# Patient Record
Sex: Female | Born: 1950 | Race: White | Hispanic: No | Marital: Married | Smoking: Former smoker
Health system: Southern US, Community
[De-identification: ages and names within clinical notes are randomized; demographics above are authoritative.]

## PROBLEM LIST (undated history)

## (undated) DIAGNOSIS — I6529 Occlusion and stenosis of unspecified carotid artery: Secondary | ICD-10-CM

## (undated) DIAGNOSIS — M1990 Unspecified osteoarthritis, unspecified site: Secondary | ICD-10-CM

## (undated) DIAGNOSIS — H3581 Retinal edema: Secondary | ICD-10-CM

## (undated) DIAGNOSIS — K219 Gastro-esophageal reflux disease without esophagitis: Secondary | ICD-10-CM

## (undated) DIAGNOSIS — H18603 Keratoconus, unspecified, bilateral: Secondary | ICD-10-CM

## (undated) DIAGNOSIS — I7300 Raynaud's syndrome without gangrene: Secondary | ICD-10-CM

## (undated) DIAGNOSIS — I739 Peripheral vascular disease, unspecified: Secondary | ICD-10-CM

## (undated) DIAGNOSIS — G40909 Epilepsy, unspecified, not intractable, without status epilepticus: Secondary | ICD-10-CM

## (undated) DIAGNOSIS — E785 Hyperlipidemia, unspecified: Secondary | ICD-10-CM

## (undated) DIAGNOSIS — H3530 Unspecified macular degeneration: Secondary | ICD-10-CM

## (undated) DIAGNOSIS — G709 Myoneural disorder, unspecified: Secondary | ICD-10-CM

## (undated) DIAGNOSIS — I10 Essential (primary) hypertension: Secondary | ICD-10-CM

## (undated) DIAGNOSIS — I639 Cerebral infarction, unspecified: Secondary | ICD-10-CM

## (undated) DIAGNOSIS — M069 Rheumatoid arthritis, unspecified: Secondary | ICD-10-CM

## (undated) DIAGNOSIS — H409 Unspecified glaucoma: Secondary | ICD-10-CM

## (undated) DIAGNOSIS — G43909 Migraine, unspecified, not intractable, without status migrainosus: Secondary | ICD-10-CM

## (undated) HISTORY — DX: Epilepsy, unspecified, not intractable, without status epilepticus: G40.909

## (undated) HISTORY — DX: Raynaud's syndrome without gangrene: I73.00

## (undated) HISTORY — DX: Rheumatoid arthritis, unspecified: M06.9

## (undated) HISTORY — DX: Keratoconus, unspecified, bilateral: H18.603

## (undated) HISTORY — DX: Hyperlipidemia, unspecified: E78.5

## (undated) HISTORY — DX: Occlusion and stenosis of unspecified carotid artery: I65.29

## (undated) HISTORY — DX: Unspecified glaucoma: H40.9

## (undated) HISTORY — DX: Essential (primary) hypertension: I10

## (undated) HISTORY — DX: Migraine, unspecified, not intractable, without status migrainosus: G43.909

## (undated) HISTORY — DX: Unspecified osteoarthritis, unspecified site: M19.90

---

## 1979-07-20 DIAGNOSIS — H18603 Keratoconus, unspecified, bilateral: Secondary | ICD-10-CM

## 1979-07-20 HISTORY — DX: Keratoconus, unspecified, bilateral: H18.603

## 1993-07-19 HISTORY — PX: CHOLECYSTECTOMY: SHX55

## 1998-09-08 ENCOUNTER — Encounter: Payer: Self-pay | Admitting: Emergency Medicine

## 1998-09-08 ENCOUNTER — Emergency Department (HOSPITAL_COMMUNITY): Admission: EM | Admit: 1998-09-08 | Discharge: 1998-09-08 | Payer: Self-pay | Admitting: Emergency Medicine

## 1999-03-20 ENCOUNTER — Other Ambulatory Visit: Admission: RE | Admit: 1999-03-20 | Discharge: 1999-03-20 | Payer: Self-pay | Admitting: Obstetrics and Gynecology

## 2000-05-02 ENCOUNTER — Ambulatory Visit (HOSPITAL_COMMUNITY): Admission: RE | Admit: 2000-05-02 | Discharge: 2000-05-02 | Payer: Self-pay | Admitting: Endocrinology

## 2000-05-02 ENCOUNTER — Encounter: Payer: Self-pay | Admitting: Endocrinology

## 2000-06-17 ENCOUNTER — Encounter: Payer: Self-pay | Admitting: General Surgery

## 2000-06-20 ENCOUNTER — Encounter (INDEPENDENT_AMBULATORY_CARE_PROVIDER_SITE_OTHER): Payer: Self-pay | Admitting: Specialist

## 2000-06-20 ENCOUNTER — Encounter: Payer: Self-pay | Admitting: General Surgery

## 2000-06-20 ENCOUNTER — Observation Stay (HOSPITAL_COMMUNITY): Admission: RE | Admit: 2000-06-20 | Discharge: 2000-06-21 | Payer: Self-pay | Admitting: General Surgery

## 2000-11-17 ENCOUNTER — Encounter: Admission: RE | Admit: 2000-11-17 | Discharge: 2000-11-17 | Payer: Self-pay | Admitting: Obstetrics and Gynecology

## 2000-11-17 ENCOUNTER — Encounter: Payer: Self-pay | Admitting: Obstetrics and Gynecology

## 2000-12-08 ENCOUNTER — Other Ambulatory Visit: Admission: RE | Admit: 2000-12-08 | Discharge: 2000-12-08 | Payer: Self-pay | Admitting: Obstetrics and Gynecology

## 2002-11-12 ENCOUNTER — Encounter (INDEPENDENT_AMBULATORY_CARE_PROVIDER_SITE_OTHER): Payer: Self-pay

## 2002-11-12 ENCOUNTER — Ambulatory Visit (HOSPITAL_COMMUNITY): Admission: RE | Admit: 2002-11-12 | Discharge: 2002-11-12 | Payer: Self-pay | Admitting: *Deleted

## 2003-04-11 ENCOUNTER — Ambulatory Visit (HOSPITAL_COMMUNITY): Admission: RE | Admit: 2003-04-11 | Discharge: 2003-04-11 | Payer: Self-pay | Admitting: Endocrinology

## 2003-04-12 ENCOUNTER — Encounter: Admission: RE | Admit: 2003-04-12 | Discharge: 2003-04-12 | Payer: Self-pay | Admitting: Endocrinology

## 2003-04-12 ENCOUNTER — Encounter: Payer: Self-pay | Admitting: Endocrinology

## 2004-01-10 ENCOUNTER — Other Ambulatory Visit: Admission: RE | Admit: 2004-01-10 | Discharge: 2004-01-10 | Payer: Self-pay | Admitting: Obstetrics and Gynecology

## 2004-02-24 ENCOUNTER — Encounter (INDEPENDENT_AMBULATORY_CARE_PROVIDER_SITE_OTHER): Payer: Self-pay | Admitting: *Deleted

## 2004-02-24 ENCOUNTER — Ambulatory Visit (HOSPITAL_COMMUNITY): Admission: RE | Admit: 2004-02-24 | Discharge: 2004-02-24 | Payer: Self-pay | Admitting: *Deleted

## 2004-04-23 ENCOUNTER — Encounter: Admission: RE | Admit: 2004-04-23 | Discharge: 2004-04-23 | Payer: Self-pay | Admitting: Internal Medicine

## 2006-05-05 ENCOUNTER — Ambulatory Visit: Payer: Self-pay | Admitting: Internal Medicine

## 2006-06-15 ENCOUNTER — Ambulatory Visit: Payer: Self-pay | Admitting: Internal Medicine

## 2006-07-19 HISTORY — PX: CAROTID ARTERY ANGIOPLASTY: SHX1300

## 2006-10-11 ENCOUNTER — Ambulatory Visit: Payer: Self-pay | Admitting: Internal Medicine

## 2006-10-31 ENCOUNTER — Emergency Department (HOSPITAL_COMMUNITY): Admission: EM | Admit: 2006-10-31 | Discharge: 2006-10-31 | Payer: Self-pay | Admitting: Emergency Medicine

## 2007-02-21 ENCOUNTER — Encounter: Admission: RE | Admit: 2007-02-21 | Discharge: 2007-02-21 | Payer: Self-pay | Admitting: Rheumatology

## 2007-11-09 ENCOUNTER — Ambulatory Visit: Payer: Self-pay | Admitting: *Deleted

## 2007-11-13 ENCOUNTER — Encounter: Admission: RE | Admit: 2007-11-13 | Discharge: 2007-11-13 | Payer: Self-pay | Admitting: *Deleted

## 2007-11-15 ENCOUNTER — Ambulatory Visit: Payer: Self-pay | Admitting: *Deleted

## 2007-11-15 ENCOUNTER — Encounter (INDEPENDENT_AMBULATORY_CARE_PROVIDER_SITE_OTHER): Payer: Self-pay | Admitting: *Deleted

## 2007-11-15 ENCOUNTER — Inpatient Hospital Stay (HOSPITAL_COMMUNITY): Admission: RE | Admit: 2007-11-15 | Discharge: 2007-11-16 | Payer: Self-pay | Admitting: *Deleted

## 2007-11-15 HISTORY — PX: CAROTID ENDARTERECTOMY: SUR193

## 2007-11-30 ENCOUNTER — Ambulatory Visit: Payer: Self-pay | Admitting: *Deleted

## 2007-12-14 ENCOUNTER — Ambulatory Visit: Payer: Self-pay | Admitting: *Deleted

## 2008-02-15 ENCOUNTER — Ambulatory Visit: Payer: Self-pay | Admitting: *Deleted

## 2008-02-27 ENCOUNTER — Encounter (INDEPENDENT_AMBULATORY_CARE_PROVIDER_SITE_OTHER): Payer: Self-pay | Admitting: *Deleted

## 2008-02-27 ENCOUNTER — Ambulatory Visit (HOSPITAL_COMMUNITY): Admission: RE | Admit: 2008-02-27 | Discharge: 2008-02-27 | Payer: Self-pay | Admitting: *Deleted

## 2008-05-23 ENCOUNTER — Ambulatory Visit: Payer: Self-pay | Admitting: *Deleted

## 2009-07-09 ENCOUNTER — Ambulatory Visit: Payer: Self-pay | Admitting: Vascular Surgery

## 2010-01-21 ENCOUNTER — Encounter: Admission: RE | Admit: 2010-01-21 | Discharge: 2010-01-21 | Payer: Self-pay | Admitting: Endocrinology

## 2010-07-21 ENCOUNTER — Encounter
Admission: RE | Admit: 2010-07-21 | Discharge: 2010-07-21 | Payer: Self-pay | Source: Home / Self Care | Attending: Endocrinology | Admitting: Endocrinology

## 2010-08-08 ENCOUNTER — Encounter: Payer: Self-pay | Admitting: Endocrinology

## 2010-08-14 ENCOUNTER — Ambulatory Visit: Admit: 2010-08-14 | Payer: Self-pay | Admitting: Vascular Surgery

## 2010-08-14 ENCOUNTER — Ambulatory Visit: Admit: 2010-08-14 | Payer: Self-pay | Attending: Vascular Surgery | Admitting: Vascular Surgery

## 2010-09-11 ENCOUNTER — Other Ambulatory Visit (INDEPENDENT_AMBULATORY_CARE_PROVIDER_SITE_OTHER): Payer: BC Managed Care – PPO

## 2010-09-11 ENCOUNTER — Ambulatory Visit (INDEPENDENT_AMBULATORY_CARE_PROVIDER_SITE_OTHER): Payer: BC Managed Care – PPO

## 2010-09-11 DIAGNOSIS — I6529 Occlusion and stenosis of unspecified carotid artery: Secondary | ICD-10-CM

## 2010-09-11 DIAGNOSIS — Z48812 Encounter for surgical aftercare following surgery on the circulatory system: Secondary | ICD-10-CM

## 2010-09-11 NOTE — H&P (Signed)
HISTORY AND PHYSICAL EXAMINATION  September 11, 2010  Re:  [place], [place]               DOB:  [birth year]  HISTORY OF PRESENT ILLNESS:  The patient is a 60 year old Caucasian female who presents for continued follow-up of her carotid artery disease and duplex exam.  The patient has been doing well since her last evaluation and is without complaint.  She denies amaurosis fugax, CVA, hemiparesis, hemiplegia, dysphasia, dysarthria, and facial drooping. She also denies claudication symptoms, chest pain, shortness of breath, nausea, vomiting, diarrhea, and constipation.  [name] past medical history is also significant for hypertension, hyperlipidemia, rheumatoid arthritis, epilepsy, and migraines.  PAST MEDICAL AND PAST SURGICAL HISTORY: 1. Hypertension. 2. Hyperlipidemia. 3. Rheumatoid arthritis. 4. GERD. 5. Epilepsy. 6. Migraines. 7. 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 Madilyn Fireman in 2009.  SOCIAL HISTORY:  The patient is married with 2 children.  She quit smoking 30 years ago and does not drink alcohol.  FAMILY HISTORY:  Coronary artery disease, hypertension, rheumatoid arthritis.  The patient did not bring a current list of medications with her.  REVIEW OF SYSTEMS:  A complete review of systems is negative.  Please see HPI for pertinent information.  PHYSICAL EXAM:  Blood pressure 177/87, O2 saturation 98%, heart rate 87. In general, this is a well-nourished female in no acute distress. HEENT: PERRL, EOMI.  Conjunctivae are normal.  Lungs: Clear to auscultation.  Cardiovascular: Regular rate and rhythm with no bruits auscultated.  Abdomen: Soft with active bowel sounds.  Musculoskeletal: No major deformities, no cyanosis.  There are 2+ carotid, radial, femoral, and posterior tibial pulses present bilaterally.  Neuro: No focal  weakness or paresthesias.  Skin: No ulcers or rashes.  IMAGING:  Carotid duplex exam performed on September 11, 2010 reveals no hemodynamically significant stenosis of the right internal carotid artery and a patent left carotid endarterectomy site with no left internal carotid artery stenosis.  This study is not changed since previous imaging performed on July 09, 2009.  ASSESSMENT: 1. Extracranial cerebrovascular occlusive disease, status post left     carotid endarterectomy. 2. Hypertension. 3. Hyperlipidemia. 4. Rheumatoid arthritis.  PLAN: 1. The patient will continue yearly routine evaluation with carotid     duplex for her carotid disease. 2. [name] medical issues will continue to be followed by her     primary care physician.  Pecola Leisure, [place]  Fransisco Hertz, MD Electronically Signed  AY/MEDQ  D:  09/11/2010  T:  09/11/2010  Job:  [id number]

## 2010-09-18 NOTE — Procedures (Unsigned)
CAROTID DUPLEX EXAM  INDICATION:  Left carotid endarterectomy.  HISTORY: Diabetes:  No. Cardiac:  No. Hypertension:  Yes. Smoking:  Previous. Previous Surgery:  Left carotid endarterectomy on 11/15/2007. CV History:  Currently asymptomatic. Amaurosis Fugax No, Paresthesias No, Hemiparesis No.                                      RIGHT             LEFT Brachial systolic pressure:         162               164 Brachial Doppler waveforms:         Normal            Normal Vertebral direction of flow:        Antegrade         Antegrade DUPLEX VELOCITIES (cm/sec) CCA peak systolic                   76                68 ECA peak systolic                   74                62 ICA peak systolic                   74                80 ICA end diastolic                   28                26 PLAQUE MORPHOLOGY:                  Heterogenous PLAQUE AMOUNT:                      Mild              None PLAQUE LOCATION:                    ICA  IMPRESSION: 1. No hemodynamically significant stenosis of the right internal     carotid artery with plaque formations noted, as described above. 2. Patent left carotid endarterectomy site with no left internal     carotid artery stenosis. 3. No significant change noted in the Doppler velocities when compared     to the previous examination on 07/09/2009.  ___________________________________________ Janetta Hora. Fields, MD  CH/MEDQ  D:  09/11/2010  T:  09/11/2010  Job:  [id number]

## 2010-12-01 NOTE — Discharge Summary (Signed)
Brianna [name], Brianna [name]               ACCOUNT NO.:  [id number]   MEDICAL RECORD NO.:  [id number]          PATIENT TYPE:  INP   LOCATION:  3307                         FACILITY:  [facility]   PHYSICIAN:  Wilmon Arms, PA    DATE OF BIRTH:  May 02, 1951   DATE OF ADMISSION:  11/15/2007  DATE OF DISCHARGE:  11/16/2007                               DISCHARGE SUMMARY   DISCHARGE DIAGNOSES:  1. Left carotid occlusive disease.  2. Hyperlipidemia.  3. History of seizures.  4. Migraine headaches.  5. Status post corneal transplant in the left eyes 3 times, right eye      once.   PROCEDURES PERFORMED:  Left carotid endarterectomy with Dacron patch  angioplasty closure.   COMPLICATIONS:  None.   DISCHARGE MEDICATIONS:  1. Phenergan p.r.n.  2. Imitrex p.r.n.  3. Humira injections 5 monthly.  4. Mobic 5 mg monthly.  5. Plaquenil 200 mg p.o. b.i.d.  6. Crestor p.o. daily.  7. Steroid drops, Pred Forte, O.S., daily.  8. Norvasc 2.5 mg p.o. daily.  9. Aspirin 325 mg p.o. daily.  10.Dilantin 300 mg p.o.  11.Calcium 500 mg p.o. daily.  12.Vitamin C 500 mg p.o.  13.Percocet 5/325 1 p.o. q.4 h p.r.n. pain, total number 22 were      given.   DISPOSITION:  She is being discharged home in stable condition with her  wound healing well.  Neurologically intact.  She was given careful  instructions on her wound care.  She is to clean the wound with soap and  warm water.  She is to observe the wound for drainage, increasing  redness, swelling, pain, fever greater than 101.2, and neurological  changes.  She is given appointment to see Dr. Madilyn [name] in 2 weeks.  The  office will call her with the appointment.   BRIEF IDENTIFYING STATEMENT:  Complete details, please refer the typed  history and physical.  Briefly, this is a very pleasant woman who was  referred to Dr. Madilyn [name] for left carotid occlusive disease.  Dr. Madilyn [name]  found her to be a suitable candidate for left carotid endarterectomy.  She was informed  of the risks and benefits of the procedure and after  careful consideration, elected to proceed with surgery.   HOSPITAL COURSE:  Preoperative workup was completed as an outpatient.  She was brought in through same-day surgery and underwent the  aforementioned left carotid endarterectomy by Dr. Madilyn [name].  The procedure  was without  complication.  She was returned to the Post Anesthesia Care Unit  extubated.  Following stabilization, she was transferred to a bed in a  surgical step-down unit.  She was observed overnight.  The next morning,  she was neurologically intact.  Her wound was healing well.  She was  felt stable and was discharged home.      Wilmon Arms, PA     KEL/MEDQ  D:  11/16/2007  T:  11/16/2007  Job:  [id number]   cc:   Balinda Quails, M.D.  Evie Lacks, MD  Brooke Bonito, M.D.

## 2010-12-01 NOTE — Op Note (Signed)
Brianna [name], Brianna [name]               ACCOUNT NO.:  [id number]   MEDICAL RECORD NO.:  [id number]          PATIENT TYPE:  INP   LOCATION:  2899                         FACILITY:  [facility]   PHYSICIAN:  Balinda Quails, M.D.    DATE OF BIRTH:  Feb 05, 1951   DATE OF PROCEDURE:  11/15/2007  DATE OF DISCHARGE:                               OPERATIVE REPORT   SURGEON:  Balinda Quails, MD   ASSISTANT:  RNFA   ANESTHETIC:  General endotracheal.   ANESTHESIOLOGIST:  Germaine Pomfret, MD   PREOPERATIVE DIAGNOSIS:  Severe left internal carotid artery stenosis.   POSTOPERATIVE DIAGNOSIS:  Severe left internal carotid artery stenosis.   PROCEDURE:  Left carotid endarterectomy with Dacron patch angioplasty.   CLINICAL NOTE:  Brianna [name] is a 60 year old female referred by Dr.  Sandria Manly with evidence of a severe left internal carotid artery stenosis.  On specific questioning, she noted some difficulty finding words at  times.  Also, noted some mild weakness in her right arm.  No visual  disturbance in her left eye.  No sensory change.  No gait abnormality.   Workup verified a severe left internal carotid artery stenosis.  Brought  to the operating room at this time for left carotid endarterectomy.  Risks of the operative procedure previously reviewed with the patient  include MI, CVA, cranial nerve injury, and death with a rate of 1%-2%.   OPERATIVE PROCEDURE:  The patient was brought to the operating room in  stable hemodynamic condition.  Placed under general endotracheal  anesthesia.  Foley catheter and arterial line in place.  Left neck  prepped and draped in a sterile fashion.   Curvilinear skin incision made along the anterior border of left  sternomastoid muscle.  Subcutaneous tissue divided with electrocautery.  Platysma incised.  Deep dissection carried down, facial vein ligated  with 3-0 silk and divided.  The common carotid artery mobilized down to  the omohyoid muscle and encircled  with a vessel loop.  The carotid  vessels noted be small in caliber.  Palpation of the carotid bifurcation  did reveal a plaque extending a short distance in the left internal  carotid artery.  The origin of the superior thyroid and external carotid  were freed and encircled with vessel loops.  The internal carotid artery  followed distally up to the posterior belly of the digastric muscle and  encircled with a vessel loop.   The vagus nerve and hypoglossal nerve both identified and preserved.   The patient administered 6000 units of heparin intravenously.  Adequate  circulation time permitted.  The carotid vessels controlled with clamps.  Longitudinal arteriotomy made in the distal common carotid artery.  The  arteriotomy extended across the carotid bulb and up into the internal  carotid artery.  There was a focal web-like plaque with high-grade  stenosis at the origin of left internal carotid artery.  A shunt was  then inserted.   The plaque removed with an endarterectomy elevator.  Plaque easily  brought down from the left internal carotid artery, the external carotid  was endarterectomized using  the eversion technique.  The plaque in the  bulb was mobilized down into the common carotid artery and divided  transversely.  The site irrigated with heparin-saline solution and fine  fragments of plaque removed with forceps.   A patch angioplasty endarterectomy site was then carried out with a  running 6-0 Prolene suture using a Finesse Dacron patch.  Shunt was then  removed and all vessels well flushed.  Clamps removed directing initial  antegrade flow up the external carotid artery, following this the  internal carotid was released.   There was an excellent pulse and Doppler signal in the distal internal  carotid artery.  The patient was administered 60 mg of protamine  intravenously.  Adequate hemostasis obtained.  Sponge and instrument  counts correct.   Sternomastoid fascia  closed with running 2-0 Vicryl suture.  Platysma  closed with running 3-0 Vicryl suture.  The skin closed with 4-0  Monocryl.  Dermabond applied.   The patient awakened readily.  Moved all extremities to command.  Transferred to recovery room in stable condition.      Balinda Quails, M.D.  Electronically Signed     PGH/MEDQ  D:  11/15/2007  T:  11/16/2007  Job:  [id number]   cc:   Genene Churn. Sandria Manly, M.D.  Brooke Bonito, M.D.

## 2010-12-01 NOTE — Procedures (Signed)
CAROTID DUPLEX EXAM   INDICATION:  Follow-up left carotid endarterectomy.   HISTORY:  Diabetes:  No.  Cardiac:  No.  Hypertension:  Yes.  Smoking:  Quit about 24 years ago.  Previous Surgery:  CV History:  Amaurosis Fugax No, Paresthesias No, Hemiparesis No                                       RIGHT             LEFT  Brachial systolic pressure:         118               120  Brachial Doppler waveforms:         Biphasic          Biphasic  Vertebral direction of flow:        Antegrade         Antegrade  DUPLEX VELOCITIES (cm/sec)  CCA peak systolic                   88                83  ECA peak systolic                   79                108  ICA peak systolic                   102               128 (mid)  ICA end diastolic                   42                48  PLAQUE MORPHOLOGY:                  Heterogeneous     Heterogeneous  PLAQUE AMOUNT:                      Mild              Mild  PLAQUE LOCATION:                    ICA and ECA       ICA   IMPRESSION:  1. A 1-39% stenosis noted in bilateral ICA.  2. Antegrade bilateral vertebral arteries.  3. Status post left carotid endarterectomy.   ___________________________________________  P. Liliane Bade, M.D.   MG/MEDQ  D:  05/23/2008  T:  05/23/2008  Job:  [id number]

## 2010-12-01 NOTE — Assessment & Plan Note (Signed)
OFFICE VISIT   [name], Brianna [name]  DOB:  Aug 21, 1950                                       12/14/2007  EAVWU#:[id number]   The patient underwent [name] left carotid endarterectomy for symptomatic  stenosis on 11/15/2007.  She did have Dacron patch angioplasty.  Incision was closed with subcuticular Monocryl and Dermabond.   She presented today with some drainage from her incision.  She has also  been concerned about some erythema.   The neck was evaluated, no evidence of infection.  No drainage, just [name]  dry eschar.   I do not feel this is [name] wound infection.  I have instructed the patient  regarding local wound care with soap and water.  I have however placed  her as [name] precaution on Keflex 250 mg q.6 h for 10 days.  Return in 2  weeks.   Balinda Quails, M.D.  Electronically Signed   PGH/MEDQ  D:  12/14/2007  T:  12/15/2007  Job:  [id number]

## 2010-12-01 NOTE — Procedures (Signed)
CAROTID DUPLEX EXAM   INDICATION:  Followup left carotid endarterectomy.   HISTORY:  Diabetes:  No.  Cardiac:  No.  Hypertension:  Yes.  Smoking:  Quit about 25 years ago.  Previous Surgery:  Left carotid endarterectomy on 11/15/2007.  CV History:  No.  Amaurosis Fugax No, Paresthesias No, Hemiparesis No                                       RIGHT             LEFT  Brachial systolic pressure:         140               142  Brachial Doppler waveforms:         Normal            Normal  Vertebral direction of flow:        Antegrade         Antegrade  DUPLEX VELOCITIES (cm/sec)  CCA peak systolic                   97                101  ECA peak systolic                   89                80  ICA peak systolic                   82                76  ICA end diastolic                   29                30  PLAQUE MORPHOLOGY:                  Heterogeneous     Calcific  PLAQUE AMOUNT:                      Mild              Mild  PLAQUE LOCATION:                    ICA, ECA          ICA, ECA   IMPRESSION:  1. 1%-39% stenosis of bilateral internal carotid arteries.  2. Patent left carotid endarterectomy site.  3. Antegrade flow in bilateral vertebrals.   ___________________________________________  Janetta Hora Fields, MD   CB/MEDQ  D:  07/09/2009  T:  07/09/2009  Job:  [id number]

## 2010-12-01 NOTE — Assessment & Plan Note (Signed)
OFFICE VISIT   [name], [name]  DOB:  March 24, 1951                                       11/30/2007  ONGEX#:[id number]   The patient underwent left carotid endarterectomy for severe stenosis  11/15/2007 at [facility].  She did well following surgery.  Discharged home postop day #1.  No perioperative complications.   She presents today without major complaints.   PHYSICAL EXAMINATION:  BP is 138/80, pulse 81 per minute and regular,  respirations 18 per minute.  Left neck incision healing unremarkably,  moderate residual swelling present.  Cranial nerves intact.  Strength  equal bilaterally.   The patient is doing well following her surgery.  I will plan to follow  up with her again in 6 months with a carotid Doppler evaluation.   Balinda Quails, M.D.  Electronically Signed   PGH/MEDQ  D:  11/30/2007  T:  12/01/2007  Job:  [id number]   cc:   Brooke Bonito, M.D.  Genene Churn. Love, M.D.

## 2010-12-01 NOTE — Assessment & Plan Note (Signed)
OFFICE VISIT   KONTOS, Khristie A  DOB:  11/17/1950                                       02/15/2008  NWGNF#:[id number]   The patient underwent left carotid endarterectomy 11/15/2007 at [facility].  She was last seen here on 12/14/2007 with some erythema  and drainage from her wound and was placed on Keflex with local wound  care.  The wound is now completely healed without evidence of  complication.   Left neck incision appears unremarkable.  No carotid bruit.  Cranial  nerves intact.  Strengths equal bilaterally.  BP is 132/81, pulse is 71  per minute.   Will plan to follow up, per carotid protocol, in October with a Doppler  evaluation.   Balinda Quails, M.D.  Electronically Signed   PGH/MEDQ  D:  02/15/2008  T:  02/16/2008  Job:  [id number]

## 2010-12-01 NOTE — Op Note (Signed)
[name], IMPASTATO               ACCOUNT NO.:  [id number]   MEDICAL RECORD NO.:  [id number]          PATIENT TYPE:  AMB   LOCATION:  ENDO                         FACILITY:  [facility]   PHYSICIAN:  Georgiana Spinner, M.D.    DATE OF BIRTH:  Mar 18, 1951   DATE OF PROCEDURE:  02/27/2008  DATE OF DISCHARGE:                               OPERATIVE REPORT   PROCEDURE:  Upper endoscopy.   INDICATIONS:  Question of Barrett's esophagus.   ANESTHESIA:  Fentanyl 75 mcg, Versed 9 mg.   PROCEDURE:  With the patient mildly sedated in the left lateral  decubitus position, the Pentax videoscopic endoscope was inserted in the  mouth, passed under direct vision through the esophagus which appeared  normal until we reached the distal esophagus, and there was an area of  question Barrett's that was photographed and subsequently biopsied.  The  endoscope was then advanced into the stomach.  Fundus, body, antrum,  duodenal bulb, 2nd portion duodenum were visualized.  From this point,  the endoscope was slowly withdrawn taking circumferential views of  duodenal mucosa until the endoscope had been pulled back into stomach,  placed in retroflexion to view the stomach from below.  The endoscope  was then straightened, and a biopsy was taken of the erythema on the  posterior wall and withdrawing the endoscope, subsequently taking  circumferential views of the remaining gastric and esophageal mucosa.  [name] vital signs, pulse oximeter remained stable.  The patient  tolerated procedure well without apparent complication.   FINDINGS:  1. Question of Barrett's esophagus, biopsied.  2. Erythema of gastric and antrum, biopsied.  Await biopsy reports.      The patient will call me for results and follow up with me as an      outpatient.           ______________________________  Georgiana Spinner, M.D.     GMO/MEDQ  D:  02/27/2008  T:  02/27/2008  Job:  [id number]

## 2010-12-01 NOTE — Consult Note (Signed)
VASCULAR SURGERY CONSULTATION   Brianna Brianna [name], Brianna Brianna [name]  DOB:  [birth year]                                       11/09/2007  ZOXWR#:[id number]   REFERRING PHYSICIAN:  Evie Lacks, MD.   PRIMARY CARE PHYSICIAN:  Brianna Brianna [name], M.D.   REASON FOR CONSULTATION:  Severe left internal carotid artery stenosis.   HISTORY:  The patient is Brianna [name] 60 year old female who was seen for  evaluation of migraine headaches by Dr. Sandria [name] on 10/20/2007.  During  physical examination she was noted to have Brianna [name] harsh left carotid bruit.   Carotid Doppler evaluation carried out at [facility] reveals Brianna [name] critical left common carotid proximal internal  carotid artery stenosis with velocities of 500/189 cm/sec.  Heterogeneous plaque was noted to be present associated with this severe  stenosis.   On questioning, the patient does describe some difficulty finding words.  She also notes some mild weakness in her right arm.  No visual  disturbance in the left eye.  No sensory change.  No gait abnormality.  No syncope or presyncope.   She does suffer from chronic headaches including Brianna [name] history of long-term  migraines.   PAST MEDICAL HISTORY:  1. Hyperlipidemia.  2. Seizures.  3. Migraine headaches.  4. Keratoconus status post corneal transplant left eye 3 times, right      eye once.   MEDICATIONS:  1. Plavix 75 mg daily.  2. Boniva once monthly.  3. Dilantin 300 mg daily.  4. Promethazine 25 mg p.r.n.  5. Plaquenil 400 mg daily.  6. Humira shot twice monthly.  7. Crestor 10 mg daily at bedtime.  8. Lorazepam 0.5 mg p.r.n.  9. Pred Forte 1% t.i.d.  10.Norvasc 2.5 mg daily.  11.Imitrex 6 mg p.r.n.   ALLERGIES:  SULFUR.   FAMILY HISTORY:  Mother deceased at age 78 with Brianna [name] history of heart  disease and had Brianna [name] pulmonary embolus.  Mother had seizures also.  She has  Brianna [name] sister with Brianna [name] seizure disorder.  Father died at age 63 of coronary  artery disease.  She has Brianna [name] younger brother  with congestive heart  failure.   SOCIAL HISTORY:  The patient is married, 2 children aged 66 and 42,  generally in good health.  She works as Brianna [name] Runner, broadcasting/film/video in [employer].  Does not smoke.  Discontinued tobacco use 25 years ago.  No regular  alcohol use.   REVIEW OF SYSTEMS:  Refer to patient encounter form, this was reviewed  today.  Positive findings include shortness of breath.  History of  peptic ulcer and chronic reflux.  Chronic constipation.  Pain in her  legs.  History of some dizziness, chronic headaches and seizure  disorder.  Visual problems related to corneal transplants.   PHYSICAL EXAMINATION:  General:  Alert, oriented 60 year old female.  No  acute distress.  Vital signs:  BP 142/94, pulse is 77 per minute.  HEENT:  Mouth and throat are clear.  Normocephalic.  Extraocular  movements intact.  Neck:  Supple.  No thyromegaly or adenopathy.  Cardiovascular:  Left carotid bruit.  Normal heart sounds without  murmurs.  No gallops or rubs.  Regular rate and rhythm.  Chest:  Equal  air entry bilaterally.  No rales or rhonchi.  Abdomen:  Soft and  nontender.  No masses or organomegaly.  Normal bowel  sounds.  Extremities:  Full range of motion.  No ankle edema.  Neurological:  Cranial nerves intact.  Strength equal bilaterally.  One plus reflexes  bilaterally.  Speech is clear.  Skin:  Warm, dry, intact.   IMPRESSION:  1. Severe left carotid bifurcation stenosis greater than 80%.      Symptoms suspicious for transient ischemic attack or nondisabling      stroke.  2. Seizure disorder.  3. Hyperlipidemia.  4. Migraine headaches.   RECOMMENDATIONS:  The patient will undergo MRI of the brain to rule out  left brain stroke prior to planned left carotid endarterectomy.   PLAN:  Left carotid endarterectomy for 11/15/2007 [facility].  Details of the operative procedure have been reviewed with the patient.  Potential risks including an MI, CVA, cranial nerve injury and  death  have been reviewed with Brianna [name] rate of 1-2%.   Other chronic medical conditions well controlled.   Balinda Quails, M.D.  Electronically Signed  PGH/MEDQ  D:  11/09/2007  T:  11/10/2007  Job:  [id number]   cc:   Brianna Brianna [name], M.D.  Genene Churn. Love, M.D.

## 2010-12-04 NOTE — Assessment & Plan Note (Signed)
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  The record indicates she was  placed on this, intending to be used on an p.r.n. basis but has been  using it daily.  Since the onset of spring 3 weeks ago, she has had  spring season in terms of flowers.  She has had increased itching and  sneezing and also watery eyes, but no increase in dyspnea, significant  cough, fever, chills, sweats, without leg swelling, chest pain, Brianna [name]  recurrent sputum.   For full medication please see patient comp sheet dated October 11, 2006,  correct as listed.  Note that she is on Prilosec one daily, but does not  necessarily take it related to meals and is having overt reflux  symptoms.   Heartburn is better.   PHYSICAL EXAMINATION:  GENERAL:  She is Brianna [name] pleasant she is Brianna [name] pleasant  ambulatory white female who clears her throat frequently during the  exam, otherwise looks great.  HEENT:  Reveals mild to moderate turbinate edema with no purulent  secretions.  Ear canals clear bilaterally.  Dentition was intact.  Oropharynx was clear with no excessive post nasal drainage,  cobblestoning.  NECK:  Supple without cervical adenopathy or tenderness.  LUNGS:  Lung fields reveal minimal crackles and rhonchi on inspiration  more than expiration with adequate air movement.  HEART:  Regular rate and rhythm, without murmur, gallop, run or increase  in PMI.  ABDOMEN:  Soft, benign.  EXTREMITIES:  Without calf tenderness, cyanosis, clubbing or edema.   IMPRESSION:  1. Interstitial lung disease associated with bronchiectasis by CT      scan.  The patient with underlying collagen vascular disease but no      evidence of air flow obstruction progression by previous pulmonary      function tests dated November 2007.  I did recommend another set of      pulmonary function tests be done in 3 months with Brianna [name] chest x-ray.  2. Most of her symptoms are upper airway in nature.  It is not clear      to me to what extent the problem is allergic rhinitis verses      rhinitis related to reflux.  To cover both bases, first I reviewed      whether optimal treatment of reflux which includes Prilosec b.i.d.      before meals until better and then 1 daily with followup by Dr.      Virginia Rochester.  3. I spent most of the time however discussing the optimal management  of rhinitis with her, reviewing Brianna [name] chronic rhinitis flyer.  I      specifically recommended Veramyst (or over the counter Flonase )      b.i.d. with Afrin for the first 5 days and if not improved on this      sample, to call back for Brianna [name] sinus CT scan.  4. I spent extra time with this patient, 15 to 20 minutes of the 25      minute visit reviewing both text and graphic formatted material and      doing education regarding the diagnosis of both reflux and      rhinitis.     Charlaine Dalton. Sherene Sires, MD, [facility]  Electronically Signed    MBW/MedQ  DD: 10/11/2006  DT: 10/11/2006  Job #: [id number]   cc:   Pollyann Savoy, M.D.  Valetta Close, M.D.

## 2010-12-04 NOTE — Assessment & Plan Note (Signed)
[facility]                             PULMONARY OFFICE NOTE   NAME:Brianna [name], Brianna [name]                      MRN:          [id number]  DATE:06/16/2007                            DOB:          [birth year]    HISTORY:  A 60 year old white female with documented  bronchiectasis/interstitial lung disease dating back to 2004 with vague  nodules consistent with either rheumatoid arthritis or atypical  tuberculosis.  She has done well over the last 3 months with no  significant fevers, chills, purulent sputum or increased in dyspnea of  her baseline.   She returns as requested for PFTs.   Medications reviewed with the patient in detail in the column dated  June 15, 2006.  Note that she is maintained presently on predinsone  at 10 mg per day.   PHYSICAL EXAMINATION:  She is a pleasant, ambulatory, white female, in  no acute distress.  Stable vital signs.  HEENT:  Unremarkable.  Oropharynx clear.  Lung fields reveal minimal crackles and rhonchi on inspiration more than  expiration with adequate air movement.  Heart is a regular rhythm without murmur, gallop or rub.  Abdomen is soft and benign.  Extremities are warm without calf tenderness, cyanosis or clubbing.   PFTs reviewed and revealed no change in lung volumes or flow rates with  no significant evidence of airflow obstruction.   IMPRESSION:  Interstitial lung disease with bronchiectasis by CT scan,  but no evidence of airflow obstruction or progression of interstitial  lung disease by PFTs.  She tells me that her systemic rheumatism has  been flaring, but notes that it does not appear to be adversely  affecting her from a functional lung point of view.  Also, there is no  evidence of opportunistic infection clinically.   RECOMMENDATIONS:  Followup chest x-ray every 6 months, certainly sooner  if any progressive pulmonary symptoms develop.     Charlaine Dalton. Sherene Sires, MD, [facility]  Electronically  Signed    MBW/MedQ  DD: 06/15/2006  DT: 06/16/2006  Job #: [id number]   cc:   Brooke Bonito, M.D.

## 2010-12-04 NOTE — Op Note (Signed)
NAME:  Brianna [name], Brianna [name]                         ACCOUNT NO.:  [id number]   MEDICAL RECORD NO.:  [id number]                   PATIENT TYPE:  AMB   LOCATION:  ENDO                                 FACILITY:  [facility]   PHYSICIAN:  Georgiana Spinner, M.D.                 DATE OF BIRTH:  [birth year]   DATE OF PROCEDURE:  02/24/2004  DATE OF DISCHARGE:                                 OPERATIVE REPORT   PROCEDURE:  Upper endoscopy.   INDICATIONS:  Gastroesophageal reflux disease, question of Barrett's.   ANESTHESIA:  1. Demerol 60 mg.  2. Versed 8 mg.   DESCRIPTION OF PROCEDURE:  With patient mildly sedated in the left lateral  decubitus position, the Olympus videoscopic endoscope was inserted in the  mouth, passed under direct vision through the esophagus, which appeared  normal until we reached the distal half of the esophagus, and there was [name]  white coating over the esophagus as if the patient had ingested something  that caked on the esophagus.  We were able to scrape this off with the scope  and saw therefore fairly distinct Z-line.  At this point, it was pretty  clear there was no evidence of Barrett's seen, although there was one small  area that could have been [name] short segment that we photographed and biopsied.  Entered into the stomach.  Fundus, body, antrum, duodenal bulb, second  portion of duodenum were visualized.  From this point, the endoscope was  slowly withdrawn, taking circumferential views of the duodenal mucosa until  the endoscope then pulled back into the stomach, placed in retroflexion to  view the stomach from below, and [name] hiatal hernia was once again seen.  The  endoscope was then straightened and withdrawn, taking circumferential views  of the remaining gastric and esophageal mucosa, stopping in the antrum where  ulcers were seen in the antrum with flat, whitish bases.  Biopsies and  photographs taken.  The endoscope was then withdrawn taking circumferential  views of  the remaining gastric and esophageal mucosa.  In the fundus of the  stomach, actually up near the hiatal hernia sac, [name] small nodular area was  found and, it too, was biopsied.  The endoscope was withdrawn.  The  patient's vital signs and pulse oximeter remained stable.  The patient  tolerated the procedure well without apparent complications.   FINDINGS:  Gastric ulcers, very benign-appearing that were biopsied and no  clear-cut evidence of Barrett's esophagus.  Coating of the esophagus which  was biopsied separately and [name] small nodule of the gastric fundus, all  biopsied.   PLAN:  Await biopsy report.  The patient will call me for results and follow  up with me as an outpatient.  Georgiana Spinner, M.D.    GMO/MEDQ  D:  02/24/2004  T:  02/24/2004  Job:  [id number]

## 2010-12-04 NOTE — Op Note (Signed)
   NAME:  Brianna [name], Brianna [name]                         ACCOUNT NO.:  [id number]   MEDICAL RECORD NO.:  [id number]                   PATIENT TYPE:  AMB   LOCATION:  ENDO                                 FACILITY[facility]   PHYSICIAN:  Georgiana Spinner, M.D.                 DATE OF BIRTH:  [birth year]   DATE OF PROCEDURE:  11/12/2002  DATE OF DISCHARGE:                                 OPERATIVE REPORT   PROCEDURE:  Upper endoscopy.   INDICATIONS:  Reflux.   ANESTHESIA:  1. Demerol 60 mg.  2. Versed 8 mg.   DESCRIPTION OF PROCEDURE:  With patient mildly sedated in the left lateral  decubitus position, the Olympus videoscopic endoscope was inserted in the  mouth, passed under direct vision through the esophagus, which appeared  normal except for there was [name] questionable area of [name] flame of Barrett's  esophagus, photographed and biopsied.  We entered into the stomach.  Fundus,  body, antrum, duodenal bulb, and second portion of duodenum all appeared  normal.  From this point, the endoscope was slowly withdrawn, taking  circumferential views of the remaining gastric and esophageal mucosa.  The  patient's vital signs and pulse oximeter remained stable.  The patient  tolerated the procedure well without apparent complications.   FINDINGS:  Question of Barrett's esophagus, biopsied.   PLAN:  1. Await biopsy report.  2. The patient will call me for results and follow up with me as an     outpatient.  3. Proceed to colonoscopy as planned.                                               Georgiana Spinner, M.D.    GMO/MEDQ  D:  11/12/2002  T:  11/12/2002  Job:  [id number]

## 2010-12-04 NOTE — Assessment & Plan Note (Signed)
[facility]                               PULMONARY OFFICE NOTE   PASQUALINA, COLASURDO                      MRN:          [id number]  DATE:05/05/2006                            DOB:          Dec 11, 1950    A 60 year old white female with a history of interstitial lung disease  associated with bronchiectasis in the setting of rheumatoid arthritis.  Last  seen here 2 years ago and doing well on no pulmonary medications.  She has  minimal cough, although occasionally does bring up yellow sputum.  She  denies any pleuritic pain or hemoptysis or limitation in terms of daily  activities or nocturnal respiratory ________.   For full inventory of medications, please see face sheet, dated May 05, 2006.  She states that her systemic symptoms of rheumatoid arthritis have  overall been well controlled over the last several months but flared when  she stopped her medicines.   PHYSICAL EXAMINATION:  She is a pleasant, ambulatory, white female in no  acute distress.  She afebrile, stable vital signs.  HEENT:  Unremarkable.  Oropharynx clear.  Lung fields reveal minimal rhonchi with FVC maneuver only.  There is a regular rhythm without murmur, gallop, or rub.  ABDOMEN:  Soft, benign.  EXTREMITIES:  Warm without calf tenderness, cyanosis, or clubbing.   Heme-saturation 92% on room air.   IMPRESSION:  Interstitial lung disease associated with bronchiectasis in the  setting of rheumatoid arthritis.  It is typically a fairly benign process,  especially if the systemic disease is controlled and the patient does not  resume smoking.  I do not believe she needs any form of maintenance  pulmonary medicine at the present but does need to remember to take [facility] p.r.n. purulent sputum and Mucinex-DM p.r.n. cough.   I would like to take this opportunity to recommend a chest x-ray and PFTs at  least every other year, sooner if she has any increase in frequency  of  symptoms or severity of symptoms.            ______________________________  Charlaine Dalton Sherene Sires, MD, [facility]    MBW/MedQ  DD:  05/05/2006  DT:  05/08/2006  Job #:  [id number]   cc:   Brooke Bonito, M.D.

## 2010-12-04 NOTE — Op Note (Signed)
[facility]  Patient:    Brianna [name], Brianna [name]                      MRN: [id number] Proc. Date: 06/20/00 Adm. Date:  [id number] Attending:  Henrene Dodge CC:         Bernadene Person, M.D.   Operative Report  PREOPERATIVE DIAGNOSIS:  Chronic cholecystitis.  POSTOPERATIVE DIAGNOSIS:  Chronic cholecystitis.  OPERATION:  Laparoscopic cholecystectomy with cholangiogram.  ANESTHESIA:  General.  SURGEON:  Anselm Pancoast. Zachery Dakins, M.D.  ASSISTANT:  Adolph Pollack, M.D.  HISTORY:  Melana Hingle is a 60 year old Caucasian female, referred to me by Dr. Juleen China for management of symptomatic gallstones.  The patient has had episodes of severe epigastric pain.  Ultrasound showed small stones, and she was referred to [place] for cholecystectomy.  The patient also has problems with significant hemorrhoids and intermittent rectal bleeding and is having a colon examination to make sure that it is only the hemorrhoids causing this bleeding.  PREOPERATIVE PREPARATION:  The patient was given Unasyn 3 g, with PAS stockings and then taken to the operating suite.  DESCRIPTION OF PROCEDURE:  After induction of general anesthesia with endotracheal tube, a small incision was made below the umbilicus after the abdomen had been with Betadine soapy scrubbing solution and draped in a sterile manner.  The fascia, which was thin, was picked up between two hemostats and a small opening made through the peritoneal cavity.  The underlying peritoneum was opened.  A pursestring suture of 0 Vicryl was placed, then the Hasson cannula introduced.  Carbon dioxide was infused.  The upper 10 mm port was placed after anesthetizing the patient; at the subxiphoid area the two lateral 5 mm trocars were placed at the appropriate lateral position.  The gallbladder was chronically thickened, a lot of adhesions ______; they stripped away fairly easily, however.  The duodenum was adherent,  but carefully it was dissected free dome.  Then the cystic duct was identified as well as the cystic artery.  The gallbladder was "flush" with the junction of the cystic duct in the gallbladder, and then a taut catheter was introduced into the cystic duct and entry was obtained.  It was a fairly long segment of cystic duct.  Then good filling of the common bile duct and flow into the duodenum.  The intrahepatic ______ was filled nicely, and then the carbon dioxide was reinfused.  The catheter was removed.  The cystic duct was triply clipped and divided.  The cystic artery was then doubly clipped proximally, singly distally and divided.  It was level with the posterior branch on the bed of liver that was doubly clipped, and then the gallbladder was freed from its bed with the hook electrocautery.  Good hemostasis was obtained.  The gallbladder was opened, there was one very small yellow stone within the proximal portion of the gallbladder. We then switched the camera to the upper 10 mm port.  The gallbladder was grasped and it slipped through the umbilical defect nicely.  The pursestring suture was tied and then a second stitch was placed at the umbilicus fascia. Inspection of the umbilicus revealed there was good closure, with no entrapment of the intestines.  The lateral 5 mm port was withdrawn.  The bed was dry and then the carbon dioxide released.  The upper 10 mm trocar was then withdrawn.  The patient tolerated the procedure nicely.  The subcutaneous wounds were closed with 4-0 Vicryl,  and then Benzoin and Steri-Strips on the skin.  The patient was then sent to recovery room, extubated in satisfactory postoperative condition. DD:  06/20/00 TD:  06/20/00 Job: [id number]

## 2010-12-04 NOTE — Op Note (Signed)
   NAME:  Brianna [name], Brianna [name]                         ACCOUNT NO.:  [id number]   MEDICAL RECORD NO.:  [id number]                   PATIENT TYPE:  AMB   LOCATION:  ENDO                                 FACILITY:  [facility]   PHYSICIAN:  Georgiana Spinner, M.D.                 DATE OF BIRTH:  [birth year]   DATE OF PROCEDURE:  DATE OF DISCHARGE:                                 OPERATIVE REPORT   PROCEDURE:  Colonoscopy.   ANESTHESIA:  Demerol 40, Versed 2 mg.   INDICATIONS FOR PROCEDURE:  Colon polyps.   DESCRIPTION OF PROCEDURE:  With the patient mildly sedated in the left  lateral decubitus position, the Olympus videoscopic colonoscope was inserted  in the rectum and passed under direct vision to the cecum identified by the  ileocecal valve and appendiceal orifice both of which were photographed.  From this point, the colonoscope was slowly withdrawn taking circumferential  views of the entire colonic mucosa stopping only then in the rectum where [name]  small polyp was seen, photographed and removed using hot biopsy forceps  technique. The endoscope was then placed in retroflexion to view the anal  canal from above and [name] small internal hemorrhoid was seen.  The endoscope  was straightened and withdrawn. [name] vital signs and pulse oximeter  remained stable. The patient tolerated the procedure well without apparent  complications.   FINDINGS:  Rectal polyp, internal hemorrhoid, otherwise unremarkable exam.   PLAN:  Have the patient call for the results of biopsy and followup with me  as an outpatient.                                               Georgiana Spinner, M.D.    GMO/MEDQ  D:  11/12/2002  T:  11/12/2002  Job:  [id number]

## 2011-01-13 ENCOUNTER — Encounter: Payer: Self-pay | Admitting: Internal Medicine

## 2011-01-14 ENCOUNTER — Encounter: Payer: Self-pay | Admitting: Internal Medicine

## 2011-01-14 ENCOUNTER — Ambulatory Visit (INDEPENDENT_AMBULATORY_CARE_PROVIDER_SITE_OTHER): Payer: BC Managed Care – PPO | Admitting: Internal Medicine

## 2011-01-14 VITALS — BP 128/80 | HR 82 | Temp 98.0°F | Ht 63.0 in | Wt 130.0 lb

## 2011-01-14 DIAGNOSIS — Z23 Encounter for immunization: Secondary | ICD-10-CM

## 2011-01-14 DIAGNOSIS — R059 Cough, unspecified: Secondary | ICD-10-CM

## 2011-01-14 DIAGNOSIS — R05 Cough: Secondary | ICD-10-CM

## 2011-01-14 DIAGNOSIS — J8410 Pulmonary fibrosis, unspecified: Secondary | ICD-10-CM

## 2011-01-14 MED ORDER — PNEUMOCOCCAL VAC POLYVALENT 25 MCG/0.5ML IJ INJ: 0.5000 mL | INJECTION | Freq: Once | INTRAMUSCULAR | Status: DC

## 2011-01-14 MED ORDER — FAMOTIDINE 20 MG PO TABS: ORAL_TABLET | ORAL | Status: DC

## 2011-01-14 NOTE — Assessment & Plan Note (Addendum)
The most common causes of chronic cough in immunocompetent adults include the following: upper airway cough syndrome (UACS), previously referred to as postnasal drip syndrome (PNDS), which is caused by variety of rhinosinus conditions; (2) asthma; (3) GERD; (4) chronic bronchitis from cigarette smoking or other inhaled environmental irritants; (5) nonasthmatic eosinophilic bronchitis; and (6) bronchiectasis.   These conditions, singly or in combination, have accounted for up to 94% of the causes of chronic cough in prospective studies.   Other conditions have constituted no >6% of the causes in prospective studies These have included bronchogenic carcinoma, chronic interstitial pneumonia, sarcoidosis, left ventricular failure, ACEI-induced cough, and aspiration from a condition associated with pharyngeal dysfunction.   This is most c/w  Classic Upper airway cough syndrome, so named because it's frequently impossible to sort out how much is  CR/sinusitis with freq throat clearing (which can be related to primary GERD)   vs  causing  secondary (" extra esophageal")  GERD from wide swings in gastric pressure that occur with throat clearing, often  promoting self use of mint and menthol lozenges that reduce the lower esophageal sphincter tone and exacerbate the problem further in a cyclical fashion.   These are the same pts who not infrequently have failed to tolerate ace inhibitors,  dry powder inhalers or biphosphonates or report having reflux symptoms that don't respond to standard doses of PPI , and are easily confused as having aecopd or asthma flares, Will start with empiric heavy acid suppression and eval with  sinus CT and go from there.

## 2011-01-14 NOTE — Assessment & Plan Note (Signed)
The general rule is that ild assoc with collagen vasc dz mimics the primary dx and pts with cough due to ild do so with inspiration, both factors here favor dx of upper airway cough syndrome, not a complication of RA  Will need to return for full pft's

## 2011-01-14 NOTE — Progress Notes (Signed)
Subjective:     Patient ID: Brianna [name], female   DOB: 02-15-1951, 60 y.o.   MRN: [id number]  HPI  RA = Deveshwar Primary = Dr Juleen China  26 yowf quit smoking age 36 with no resp problems then.  Age 72 bad pneumonia.  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   No sign doe but not that active.   Sleeping ok without nocturnal  or early am exac of resp c/o's or need for noct saba.    Pt denies any significant sore throat, dysphagia, itching, sneezing,  nasal congestion or excess/ purulent secretions,  fever, chills, sweats, unintended wt loss, pleuritic or exertional cp, hempoptysis, orthopnea pnd or leg swelling.    Also denies any obvious fluctuation of symptoms with weather or environmental changes or other aggravating or alleviating factors.   RA well controlled on humira.       Review of Systems  Constitutional: Negative for fever, chills and unexpected weight change.  HENT: Positive for postnasal drip and sinus pressure. Negative for ear pain, nosebleeds, congestion, sore throat, rhinorrhea, sneezing, trouble swallowing, dental problem and voice change.   Eyes: Negative for visual disturbance.  Respiratory: Positive for cough. Negative for choking and shortness of breath.   Cardiovascular: Negative for chest pain and leg swelling.  Gastrointestinal: Negative for vomiting, abdominal pain and diarrhea.  Genitourinary: Negative for difficulty urinating.  Musculoskeletal: Negative for arthralgias.  Skin: Negative for rash.  Neurological: Negative for tremors, syncope and headaches.  Hematological: Does not bruise/bleed easily.       Objective:   Physical Exam    amb wf nad Wt 130  01/14/2011 HEENT: nl dentition, turbinates, and orophanx. Nl external ear canals without cough reflex   NECK :  without JVD/Nodes/TM/ nl carotid upstrokes bilaterally   LUNGS: no acc muscle use, mininimal coarsening of bs bilaterally without cough on insp or exp maneuvers   CV:  RRR  no s3 or murmur or increase in P2, no edema   ABD:  soft and nontender with nl excursion in the supine position. No bruits or organomegaly, bowel sounds nl  MS:  warm without deformities, calf tenderness, cyanosis or clubbing  SKIN: warm and dry without lesions    NEURO:  alert, approp, no deficits    Ct chest 07/21/10 Assessment:        Plan:

## 2011-01-14 NOTE — Patient Instructions (Addendum)
Stop boniva for now   Prilosec / prevacid need to be taken 30-60 min before breakfast and Pepcid 20 mg one at bedtime  GERD (REFLUX)  is an extremely common cause of respiratory symptoms, many times with no significant heartburn at all.    It can be treated with medication, but also with lifestyle changes including avoidance of late meals, excessive alcohol, smoking cessation, and avoid fatty foods, chocolate, peppermint, colas, red wine, and acidic juices such as orange juice.  NO MINT OR MENTHOL PRODUCTS SO NO COUGH DROPS  USE SUGARLESS CANDY INSTEAD (jolley ranchers or Stover's)  NO OIL BASED VITAMINS   Pneumonia shot today  Please see patient coordinator before you leave today  to schedule for sinus CT  Please schedule a follow up office visit in 4 weeks, sooner if needed with PFT's on return

## 2011-01-15 ENCOUNTER — Ambulatory Visit (INDEPENDENT_AMBULATORY_CARE_PROVIDER_SITE_OTHER)
Admission: RE | Admit: 2011-01-15 | Discharge: 2011-01-15 | Disposition: A | Discharge status: Home / Self Care | Payer: BC Managed Care – PPO | Source: Ambulatory Visit | Attending: Internal Medicine | Admitting: Internal Medicine

## 2011-01-15 DIAGNOSIS — R059 Cough, unspecified: Secondary | ICD-10-CM

## 2011-01-15 DIAGNOSIS — R05 Cough: Secondary | ICD-10-CM

## 2011-01-17 ENCOUNTER — Encounter: Payer: Self-pay | Admitting: Internal Medicine

## 2011-01-18 NOTE — Progress Notes (Signed)
Quick Note:  Spoke with pt and notified of results per Dr. Wert. Pt verbalized understanding and denied any questions.  ______ 

## 2011-02-15 ENCOUNTER — Ambulatory Visit: Payer: BC Managed Care – PPO | Admitting: Internal Medicine

## 2011-03-04 ENCOUNTER — Ambulatory Visit (INDEPENDENT_AMBULATORY_CARE_PROVIDER_SITE_OTHER): Payer: BC Managed Care – PPO | Admitting: Internal Medicine

## 2011-03-04 ENCOUNTER — Encounter: Payer: Self-pay | Admitting: Internal Medicine

## 2011-03-04 VITALS — HR 72 | Temp 97.6°F | Ht 63.0 in | Wt 130.0 lb

## 2011-03-04 DIAGNOSIS — R05 Cough: Secondary | ICD-10-CM

## 2011-03-04 DIAGNOSIS — R059 Cough, unspecified: Secondary | ICD-10-CM

## 2011-03-04 DIAGNOSIS — J8410 Pulmonary fibrosis, unspecified: Secondary | ICD-10-CM

## 2011-03-04 LAB — PULMONARY FUNCTION TEST

## 2011-03-04 NOTE — Progress Notes (Signed)
Subjective:     Patient ID: Brianna [name], female   DOB: Nov 01, 1950, 60 y.o.   MRN: [id number]  HPI  RA = Deveshwar Primary = Dr Juleen China  23 yowf quit smoking age 45 with no resp problems then.  Age 39 bad pneumonia.  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   No sign doe but not that active.   Sleeping ok without nocturnal  or early am exac of resp c/o's or need for noct saba.  Stop boniva for now   Prilosec / prevacid need to be taken 30-60 min before breakfast and Pepcid 20 mg one at bedtime  GERD (REFLUX)  Diet   Pneumonia shot today   Sinus CT >  Neg   03/04/2011 f/u ov/Wert cc better to her satisfaction. No cough or limiting sob  Pt denies any significant sore throat, dysphagia, itching, sneezing,  nasal congestion or excess/ purulent secretions,  fever, chills, sweats, unintended wt loss, pleuritic or exertional cp, hempoptysis, orthopnea pnd or leg swelling.    Also denies any obvious fluctuation of symptoms with weather or environmental changes or other aggravating or alleviating factors.                   Objective:   Physical Exam    amb wf nad Wt 130 01/14/2011 HEENT: nl dentition, turbinates, and orophanx. Nl external ear canals without cough reflex   NECK :  without JVD/Nodes/TM/ nl carotid upstrokes bilaterally   LUNGS: no acc muscle use, mininimal coarsening of bs bilaterally without cough on insp or exp maneuvers   CV:  RRR  no s3 or murmur or increase in P2, no edema   ABD:  soft and nontender with nl excursion in the supine position. No bruits or organomegaly, bowel sounds nl  MS:  warm without deformities, calf tenderness, cyanosis or  clubbing      Ct chest 07/21/10 1. 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  2. Stable small sub centimeter bilateral pulmonary nodules,  consistent with benign etiology.  3. Stable shotty bilateral hilar lymph nodes.  4. No acute or progressive disease within the thorax.  Assessment:        Plan:

## 2011-03-04 NOTE — Patient Instructions (Signed)
Ok to restart boniva the first of September and every month but if your respiratory or reflux symptoms worsen it's first medication I would stop and take in it's place Reclast IV yearly.   If you are satisfied with your treatment and have exercise tolerance  let your doctor know and he/she can either refill your medications or you can return here when your prescription runs out.     If in any way you are not 100% satisfied,  please tell us.  If 100% better, tell your friends!

## 2011-03-04 NOTE — Progress Notes (Signed)
PFT done today. 

## 2011-03-05 ENCOUNTER — Encounter: Payer: Self-pay | Admitting: Internal Medicine

## 2011-03-05 NOTE — Assessment & Plan Note (Signed)
I had an extended discussion with the patient today lasting 15 to 20 minutes of a 25 minute visit on the following issues:  No evidence of effect of previous smoking or of RA at this point though she is at risk.  The best way to monitor is maintain aerobic activity and see if any limiting sob occurs and if not pulmonary f/u is optional

## 2011-03-05 NOTE — Assessment & Plan Note (Signed)
Sinus ct neg but this is most likely a form of  Classic Upper airway cough syndrome, so named because it's frequently impossible to sort out how much is  CR/sinusitis with freq throat clearing (which can be related to primary GERD)   vs  causing  secondary (" extra esophageal")  GERD from wide swings in gastric pressure that occur with throat clearing, often  promoting self use of mint and menthol lozenges that reduce the lower esophageal sphincter tone and exacerbate the problem further in a cyclical fashion.   These are the same pts who not infrequently have failed to tolerate ace inhibitors,  dry powder inhalers or biphosphonates or report having reflux symptoms that don't respond to standard doses of PPI , and are easily confused as having aecopd or asthma flares,   Therefore rechallenge with biphosphonates is potentially problematic and I would have a low threshold to change to yearly Reclast or twice yearly Prolia as other options in this setting if ongoing rx is felt to be needed.

## 2011-04-13 LAB — BASIC METABOLIC PANEL
BUN: 6
CO2: 29
Calcium: 8.6
Chloride: 104
Creatinine, Ser: 0.46
GFR calc Af Amer: >60
GFR calc non Af Amer: >60
Glucose, Bld: 112 — ABNORMAL HIGH
Potassium: 3.7
Sodium: 140

## 2011-04-13 LAB — COMPREHENSIVE METABOLIC PANEL
ALT: 19
AST: 22
Albumin: 4
Alkaline Phosphatase: 84
BUN: 11
CO2: 31
Calcium: 9.2
Chloride: 101
Creatinine, Ser: 0.58
GFR calc Af Amer: >60
GFR calc non Af Amer: >60
Glucose, Bld: 80
Potassium: 3.6
Sodium: 139
Total Bilirubin: 0.2 — ABNORMAL LOW
Total Protein: 7.6

## 2011-04-13 LAB — URINALYSIS, ROUTINE W REFLEX MICROSCOPIC
Bilirubin Urine: NEGATIVE
Glucose, UA: NEGATIVE
Ketones, ur: NEGATIVE
Leukocytes, UA: NEGATIVE
Nitrite: NEGATIVE
Protein, ur: NEGATIVE
Specific Gravity, Urine: 1.021
Urobilinogen, UA: 0.2
pH: 5

## 2011-04-13 LAB — APTT: aPTT: 24

## 2011-04-13 LAB — PROTIME-INR
INR: 1
Prothrombin Time: 13

## 2011-04-13 LAB — ABO/RH: ABO/RH(D): A POS

## 2011-04-13 LAB — CBC
HCT: 33 — ABNORMAL LOW
HCT: 42
Hemoglobin: 11.5 — ABNORMAL LOW
Hemoglobin: 14.3
MCHC: 34
MCHC: 34.8
MCV: 87.6
MCV: 87.8
Platelets: 200
Platelets: 267
RBC: 3.75 — ABNORMAL LOW
RBC: 4.79
RDW: 13.2
RDW: 14.3
WBC: 10.8 — ABNORMAL HIGH
WBC: 9

## 2011-04-13 LAB — TYPE AND SCREEN
ABO/RH(D): A POS
Antibody Screen: NEGATIVE

## 2011-04-13 LAB — URINE MICROSCOPIC-ADD ON

## 2011-08-02 DIAGNOSIS — H18603 Keratoconus, unspecified, bilateral: Secondary | ICD-10-CM | POA: Insufficient documentation

## 2011-08-02 DIAGNOSIS — Z947 Corneal transplant status: Secondary | ICD-10-CM | POA: Insufficient documentation

## 2011-08-02 DIAGNOSIS — H17819 Minor opacity of cornea, unspecified eye: Secondary | ICD-10-CM | POA: Insufficient documentation

## 2011-09-13 ENCOUNTER — Other Ambulatory Visit: Payer: BC Managed Care – PPO

## 2012-07-13 ENCOUNTER — Encounter: Payer: Self-pay | Admitting: Vascular Surgery

## 2013-02-01 ENCOUNTER — Other Ambulatory Visit: Payer: Self-pay | Admitting: *Deleted

## 2013-02-01 DIAGNOSIS — Z48812 Encounter for surgical aftercare following surgery on the circulatory system: Secondary | ICD-10-CM

## 2013-02-01 DIAGNOSIS — I714 Abdominal aortic aneurysm, without rupture: Secondary | ICD-10-CM

## 2013-02-05 ENCOUNTER — Other Ambulatory Visit (INDEPENDENT_AMBULATORY_CARE_PROVIDER_SITE_OTHER): Payer: BC Managed Care – PPO | Admitting: Vascular Surgery

## 2013-02-05 DIAGNOSIS — I6529 Occlusion and stenosis of unspecified carotid artery: Secondary | ICD-10-CM

## 2013-02-05 DIAGNOSIS — Z48812 Encounter for surgical aftercare following surgery on the circulatory system: Secondary | ICD-10-CM

## 2013-02-08 ENCOUNTER — Other Ambulatory Visit: Payer: Self-pay | Admitting: *Deleted

## 2013-02-08 DIAGNOSIS — Z48812 Encounter for surgical aftercare following surgery on the circulatory system: Secondary | ICD-10-CM

## 2013-02-15 ENCOUNTER — Encounter: Payer: Self-pay | Admitting: Vascular Surgery

## 2013-04-09 ENCOUNTER — Telehealth: Payer: Self-pay | Admitting: Neurology

## 2013-04-11 NOTE — Telephone Encounter (Signed)
I called patient. She was a patient of Dr. Sandria Manly. She has a DMV form to be completed but has not seen anyone since December, 2013. Dr. Sandria Manly would complete her forms and hand them to her so there is no record in centricity. Patient has history of seizures and migraines.   We have assigned her to Dr. Hosie Poisson and scheduled an Appointment for April 19, 2013 at 9:30 a.m.

## 2013-04-11 NOTE — Telephone Encounter (Signed)
That is fine. Please let her know she can drop the form off earlier if needed or I will do it at her office visit. Thanks.

## 2013-04-19 ENCOUNTER — Encounter: Payer: Self-pay | Admitting: Neurology

## 2013-04-19 ENCOUNTER — Ambulatory Visit (INDEPENDENT_AMBULATORY_CARE_PROVIDER_SITE_OTHER): Payer: BC Managed Care – PPO | Admitting: Neurology

## 2013-04-19 VITALS — BP 132/83 | HR 68 | Ht 63.0 in | Wt 130.0 lb

## 2013-04-19 DIAGNOSIS — G43009 Migraine without aura, not intractable, without status migrainosus: Secondary | ICD-10-CM | POA: Insufficient documentation

## 2013-04-19 DIAGNOSIS — R569 Unspecified convulsions: Secondary | ICD-10-CM | POA: Insufficient documentation

## 2013-04-19 DIAGNOSIS — H18603 Keratoconus, unspecified, bilateral: Secondary | ICD-10-CM

## 2013-04-19 DIAGNOSIS — H18609 Keratoconus, unspecified, unspecified eye: Secondary | ICD-10-CM

## 2013-04-19 MED ORDER — TOPIRAMATE 25 MG PO CPSP: 75.0000 mg | ORAL_CAPSULE | Freq: Every day | ORAL | Status: DC

## 2013-04-19 NOTE — Progress Notes (Signed)
Provider:  Dr Hosie Poisson Referring Provider: Darci Needle, MD Primary Care Physician:  Michiel Sites, MD  CC:  Headache and seizure  HPI:  Brianna [name] is a 62 y.o. female here as a follow up Today/Interim:  Needs DMV paperwork filled out for her seizures. Last seizure was at age 47. He is on Dilantin 200 mg daily. Reports good compliance with this medication. Notes some gingival hypertrophy. Is followed with a regular DEXA scan. She is happy with her current regimen and is hesitant to change to 2 excellent control.  Has concerns about migraines. She is not getting any benefit with the Topamax. Has been on this for greater than 6 months. Averaging  4 headaches per week, can last all day. Continues to take Imitrex injections, feels she gets good benefit from injections. Has tried VPA in the past for headaches but could not tolerate. In the past have been on another preventive medication, unsure of name but believes an antidepressant, this medication did not work either. Headaches remain similar in nature and quality. Has no aura but has associated N/V, photo and phonophobia. No visual changes. Has frequent headache spikes up to 8-10/10 pain severity.   Reviewed notes and lab/imaging from Dr Sandria Manly visit of 06/2012 showing woman with hx of bilateral corneal transplant for keratoconus, on plaquenil. With hx of migraines and seziures. Plan in future was to taper off Dilantin as Topamax was increased.   Concerns/Questions:Review of Systems: Out of a complete 14 system review, the patient complains of only the following symptoms, and all other reviewed systems are negative. Positive for headache  History   Social History  . Marital Status: Married    Spouse Name: N/A    Number of Children: 2  . Years of Education: N/A   Occupational History  . Teacher    Social History Main Topics  . Smoking status: Former Smoker -- 0.50 packs/day for 14 years    Types: Cigarettes    Quit date:  07/19/1988  . Smokeless tobacco: Never Used  . Alcohol Use: Yes     Comment: very rare- "gives me migraines"  . Drug Use: No  . Sexual Activity: Not on file   Other Topics Concern  . Not on file   Social History Narrative  . No narrative on file    Family History  Problem Relation Age of Onset  . Heart disease Mother   . Heart disease Father   . Lung disease Mother     ? disease process  . Uterine cancer Mother   . Cervical cancer Maternal Aunt   . Prostate cancer Maternal Grandfather   . Clotting disorder Father   . Rheum arthritis Sister   . Rheum arthritis Maternal Grandmother     Past Medical History  Diagnosis Date  . Hypertension   . Hyperlipidemia   . Migraines   . Epilepsy   . Raynaud's disease   . Rheumatoid arthritis(714.0)     Past Surgical History  Procedure Laterality Date  . Carotid artery angioplasty  2008    Dr Madilyn Fireman  . Corneal transplant      x 5   . Cholecystectomy  1995    Current Outpatient Prescriptions  Medication Sig Dispense Refill  . amLODipine (NORVASC) 5 MG tablet Take 5 mg by mouth daily.        Marland Kitchen atorvastatin (LIPITOR) 40 MG tablet Take 40 mg by mouth daily.        . Calcium Carbonate-Vit D-Min (CALCIUM 1200  PO) Take 2 capsules by mouth daily.        . Cholecalciferol (VITAMIN D PO) Take by mouth daily.      Marland Kitchen ESTRACE VAGINAL 0.1 MG/GM vaginal cream       . ezetimibe (ZETIA) 10 MG tablet Take 10 mg by mouth daily.        . hydroxychloroquine (PLAQUENIL) 200 MG tablet Take 200 mg by mouth daily.        Marland Kitchen ibandronate (BONIVA) 150 MG tablet Take 150 mg by mouth every 30 (thirty) days. Take in the morning with a full glass of water, on an empty stomach, and do not take anything else by mouth or lie down for the next 30 min.       . lansoprazole (PREVACID) 15 MG capsule Take 15 mg by mouth daily.        Marland Kitchen LORazepam (ATIVAN) 0.5 MG tablet Take 0.5 mg by mouth as needed.        . phenytoin (DILANTIN) 300 MG ER capsule Take 300 mg by  mouth daily.        . prednisoLONE acetate (PRED FORTE) 1 % ophthalmic suspension       . promethazine (PHENERGAN) 25 MG tablet Take 25 mg by mouth every 6 (six) hours as needed.        . sertraline (ZOLOFT) 50 MG tablet Take 50 mg by mouth daily.        . SUMAtriptan (IMITREX) 6 MG/0.5ML SOLN Inject 6 mg into the skin as needed.        . topiramate (TOPAMAX) 25 MG capsule       . famotidine (PEPCID) 20 MG tablet One at bedtime       Current Facility-Administered Medications  Medication Dose Route Frequency Provider Last Rate Last Dose  . pneumococcal 23 valent vaccine (PNU-IMMUNE) injection 0.5 mL  0.5 mL Intramuscular Once Nyoka Cowden, MD        Allergies as of 04/19/2013 - Review Complete 04/19/2013  Allergen Reaction Noted  . Morphine and related Shortness Of Breath 01/14/2011  . Sulfa antibiotics Rash 01/14/2011    Vitals: BP 132/83  Pulse 68  Ht 5\' 3"  (1.6 m)  Wt 130 lb (58.968 kg)  BMI 23.03 kg/m2 Last Weight:  Wt Readings from Last 1 Encounters:  04/19/13 130 lb (58.968 kg)   Last Height:   Ht Readings from Last 1 Encounters:  04/19/13 5\' 3"  (1.6 m)     Physical exam: Exam: Gen: NAD, conversant Eyes: anicteric sclerae, moist conjunctivae HENT: Atraumatic, oropharynx clear Lungs: CTA, no wheezing, rales, rhonic                          CV: RRR, no MRG Abdomen: Soft, non-tender;  Extremities: No peripheral edema  Skin: Normal temperature, no rash,  Psych: Appropriate affect, pleasant  Neuro: Brianna: AA&Ox3, appropriately interactive, normal affect   Speech: fluent w/o paraphasic error  Memory: good recent and remote recall  CN: Dilated surgical L pupil, EOMI no nystagmus, no ptosis, sensation intact to LT V1-V3 bilat, face symmetric, no weakness, hearing grossly intact, palate elevates symmetrically, shoulder shrug 5/5 bilat,  tongue protrudes midline, no fasiculations noted.  Motor: normal bulk and tone Strength: 5/5  In all extremities  Coord:  rapid alternating and point-to-point (FNF, HTS) movements intact.  Reflexes: symmetrical, bilat downgoing toes  Sens: LT intact in all extremities  Gait: posture, stance, stride and arm-swing normal. .  Assessment:  After physical and neurologic examination, review of laboratory studies, imaging, neurophysiology testing and pre-existing records, assessment will be reviewed on the problem list.  Plan:  Treatment plan and additional workup will be reviewed under Problem List.  1)Migraine without aura 2)Seizure disorder 3)Keratoconus s/p corneal transplant  Brianna [name] is a pleasant 15-year-old woman with a history of keratoconus status post corneal transplant, seizure disorder and migraine headaches presenting for followup appointment. She is currently on Dilantin 200 mg daily for her seizures, has not had a seizure in over 30 years. Is very happy with this medication does not wish to switch at this time. She is currently taking Topamax 75 mg nightly for her headache. Does not feel this medication is giving her much benefit. In the past she has been on Depakote and an antidepressant, unsure which one. Did not get benefit from either of these medications. We discussed different therapeutic options for her migraines as they're occurring around 4 times per week and can last all day. We discussed potentially staying on Topamax, and/or trying a higher dose versus botulinum toxin therapy. Patient will consider botulinum toxin therapy.

## 2013-04-19 NOTE — Patient Instructions (Addendum)
Overall you are doing fairly well but I do want to suggest a few things today:   Remember to drink plenty of fluid, eat healthy meals and do not skip any meals. Try to eat protein with a every meal and eat a healthy snack such as fruit or nuts in between meals. Try to keep a regular sleep-wake schedule and try to exercise daily, particularly in the form of walking, 20-30 minutes a day, if you can.   As far as your medications are concerned, I would like to suggest continuing on the Topamax and Dilantin at their current dose and schedule.   We discussed the option of Botox therapy for migraines. Please read over the material and call if you have any questions.   I would like to see you back in 6 to 8 months, sooner if we need to. Please call us with any interim questions, concerns, problems, updates or refill requests.   My clinical assistant and will answer any of your questions and relay your messages to me and also relay most of my messages to you.   Our phone number is [phone]. We also have an after hours call service for urgent matters and there is a physician on-call for urgent questions. For any emergencies you know to call 911 or go to the nearest emergency room

## 2013-05-23 ENCOUNTER — Encounter: Payer: Self-pay | Admitting: Neurology

## 2013-05-23 ENCOUNTER — Ambulatory Visit (INDEPENDENT_AMBULATORY_CARE_PROVIDER_SITE_OTHER): Payer: BC Managed Care – PPO | Admitting: Neurology

## 2013-05-23 VITALS — BP 135/74 | HR 71 | Ht 62.0 in | Wt 129.0 lb

## 2013-05-23 DIAGNOSIS — G43719 Chronic migraine without aura, intractable, without status migrainosus: Secondary | ICD-10-CM

## 2013-05-23 DIAGNOSIS — IMO0002 Reserved for concepts with insufficient information to code with codable children: Secondary | ICD-10-CM

## 2013-05-23 DIAGNOSIS — G43709 Chronic migraine without aura, not intractable, without status migrainosus: Secondary | ICD-10-CM

## 2013-05-23 MED ORDER — ONABOTULINUMTOXINA 100 UNITS IJ SOLR: 200.0000 [IU] | Freq: Once | INTRAMUSCULAR | Status: DC

## 2013-05-23 NOTE — Progress Notes (Signed)
[facility]   Provider:  Dr Hosie Poisson Referring Provider: Darci Needle, MD Primary Care Physician:  Michiel Sites, MD  CC:  botox for chronic migraine  HPI:  Brianna [name] is a 62 y.o. female here for initial injections for chronic migraine  Continues to have 2 to 3 headaches per week, similar in nature to prior visit. Has tried Imitrex and phenergan which gives some symptomatic relief but lately the imitrex has not been completely getting rid of the headache.   Contraindications and precautions discussed with patient. Aseptic procedure was observed and patient tolerated procedure. Procedure performed by Dr. Elspeth Cho.  The condition has existed for more than 6 months, and pt does not have a diagnosis of ALS, Myasthenia Gravis or Lambert-Eaton Syndrome.  Risks and benefits of injections discussed and pt agrees to proceed with the procedure.  Written consent obtained These injections are medically necessary. He receives good benefits from these injections. These injections do not cause sedations or hallucinations which the oral therapies may cause.  Indication/Diagnosis:chronic migraine  Type of toxin:Botox  Lot #[id number]  Expiration date: May 2017  Injection sites:  Muscle Site    L   R  Corrugator    5   5  Procerus        5  Frontalis    5x2   5x2   Temporalis    5x4   5x4    Occipitalis    5x3   5x3  Trapezius    5x3   5x3  Cervical paraspinals  5x2   5x2  History   Social History  . Marital Status: Married    Spouse Name: N/A    Number of Children: 2  . Years of Education: N/A   Occupational History  . Teacher    Social History Main Topics  . Smoking status: Former Smoker -- 0.50 packs/day for 14 years    Types: Cigarettes    Quit date: 07/19/1988  . Smokeless tobacco: Never Used  . Alcohol Use: Yes     Comment: very rare- "gives me migraines"  . Drug Use: No  . Sexual Activity: Not on file   Other Topics Concern  .  Not on file   Social History Narrative  . No narrative on file    Family History  Problem Relation Age of Onset  . Heart disease Mother   . Heart disease Father   . Lung disease Mother     ? disease process  . Uterine cancer Mother   . Cervical cancer Maternal Aunt   . Prostate cancer Maternal Grandfather   . Clotting disorder Father   . Rheum arthritis Sister   . Rheum arthritis Maternal Grandmother     Past Medical History  Diagnosis Date  . Hypertension   . Hyperlipidemia   . Migraines   . Epilepsy   . Raynaud's disease   . Rheumatoid arthritis(714.0)     Past Surgical History  Procedure Laterality Date  . Carotid artery angioplasty  2008    Dr Madilyn Fireman  . Corneal transplant      x 5   . Cholecystectomy  1995    Current Outpatient Prescriptions  Medication Sig Dispense Refill  . amLODipine (NORVASC) 5 MG tablet Take 5 mg by mouth daily.        Marland Kitchen atorvastatin (LIPITOR) 40 MG tablet Take 40 mg by mouth daily.        . Calcium Carbonate-Vit D-Min (CALCIUM 1200 PO) Take 2 capsules  by mouth daily.        . cefUROXime (CEFTIN) 500 MG tablet       . Cholecalciferol (VITAMIN D PO) Take by mouth daily.      Marland Kitchen ESTRACE VAGINAL 0.1 MG/GM vaginal cream       . ezetimibe (ZETIA) 10 MG tablet Take 10 mg by mouth daily.        Marland Kitchen GAVILYTE-G 236 G solution       . hydroxychloroquine (PLAQUENIL) 200 MG tablet Take 200 mg by mouth daily.        Marland Kitchen ibandronate (BONIVA) 150 MG tablet Take 150 mg by mouth every 30 (thirty) days. Take in the morning with a full glass of water, on an empty stomach, and do not take anything else by mouth or lie down for the next 30 min.       . lansoprazole (PREVACID) 15 MG capsule Take 15 mg by mouth daily.        Marland Kitchen LORazepam (ATIVAN) 0.5 MG tablet Take 0.5 mg by mouth as needed.        . neomycin-polymyxin-hydrocortisone (CORTISPORIN) 3.5-10000-1 otic suspension       . phenytoin (DILANTIN) 300 MG ER capsule Take 300 mg by mouth daily.        .  prednisoLONE acetate (PRED FORTE) 1 % ophthalmic suspension       . promethazine (PHENERGAN) 25 MG tablet Take 25 mg by mouth every 6 (six) hours as needed.        . sertraline (ZOLOFT) 50 MG tablet Take 50 mg by mouth daily.        . SUMAtriptan (IMITREX) 6 MG/0.5ML SOLN Inject 6 mg into the skin as needed.        . topiramate (TOPAMAX) 25 MG capsule Take 3 capsules (75 mg total) by mouth daily.  90 capsule  6  . famotidine (PEPCID) 20 MG tablet One at bedtime       Current Facility-Administered Medications  Medication Dose Route Frequency Provider Last Rate Last Dose  . pneumococcal 23 valent vaccine (PNU-IMMUNE) injection 0.5 mL  0.5 mL Intramuscular Once Nyoka Cowden, MD        Allergies as of 05/23/2013 - Review Complete 04/19/2013  Allergen Reaction Noted  . Morphine and related Shortness Of Breath 01/14/2011  . Sulfa antibiotics Rash 01/14/2011    Vitals: BP 135/74  Pulse 71  Ht 5\' 2"  (1.575 m)  Wt 129 lb (58.514 kg)  BMI 23.59 kg/m2 Last Weight:  Wt Readings from Last 1 Encounters:  05/23/13 129 lb (58.514 kg)   Last Height:   Ht Readings from Last 1 Encounters:  05/23/13 5\' 2"  (1.575 m)      Assessment:  After physical and neurologic examination, review of laboratory studies, imaging, neurophysiology testing and pre-existing records, assessment will be reviewed on the problem list.  Plan:  Treatment plan and additional workup will be reviewed under Problem List.  Brianna [name] is a 62 y.o. female here for botox injections for chronic migraine headache  1) Botox injections as detailed above. A total of 155 units was used. 45 units wasted 2) Tylenol or Motrin for injection site pain. 3) Medication guide dispensed. 4) Follow up for repeat injections in 3 months

## 2013-08-27 ENCOUNTER — Encounter: Payer: Self-pay | Admitting: Internal Medicine

## 2013-08-27 ENCOUNTER — Telehealth: Payer: Self-pay | Admitting: Neurology

## 2013-08-27 ENCOUNTER — Ambulatory Visit (INDEPENDENT_AMBULATORY_CARE_PROVIDER_SITE_OTHER): Payer: BC Managed Care – PPO | Admitting: Neurology

## 2013-08-27 ENCOUNTER — Encounter: Payer: Self-pay | Admitting: Neurology

## 2013-08-27 ENCOUNTER — Ambulatory Visit (INDEPENDENT_AMBULATORY_CARE_PROVIDER_SITE_OTHER)
Admission: RE | Admit: 2013-08-27 | Discharge: 2013-08-27 | Disposition: A | Discharge status: Home / Self Care | Payer: BC Managed Care – PPO | Source: Ambulatory Visit | Attending: Internal Medicine | Admitting: Internal Medicine

## 2013-08-27 ENCOUNTER — Ambulatory Visit (INDEPENDENT_AMBULATORY_CARE_PROVIDER_SITE_OTHER): Payer: BC Managed Care – PPO | Admitting: Internal Medicine

## 2013-08-27 VITALS — BP 159/90 | HR 75 | Ht 62.0 in | Wt 125.0 lb

## 2013-08-27 VITALS — BP 142/88 | HR 73 | Temp 98.0°F | Ht 62.0 in | Wt 126.0 lb

## 2013-08-27 DIAGNOSIS — IMO0002 Reserved for concepts with insufficient information to code with codable children: Secondary | ICD-10-CM

## 2013-08-27 DIAGNOSIS — G43709 Chronic migraine without aura, not intractable, without status migrainosus: Secondary | ICD-10-CM

## 2013-08-27 DIAGNOSIS — J8410 Pulmonary fibrosis, unspecified: Secondary | ICD-10-CM

## 2013-08-27 DIAGNOSIS — Z23 Encounter for immunization: Secondary | ICD-10-CM

## 2013-08-27 DIAGNOSIS — G43719 Chronic migraine without aura, intractable, without status migrainosus: Secondary | ICD-10-CM

## 2013-08-27 MED ORDER — ONABOTULINUMTOXINA 100 UNITS IJ SOLR: 200.0000 [IU] | Freq: Once | INTRAMUSCULAR | Status: DC

## 2013-08-27 NOTE — Patient Instructions (Addendum)
Please remember to go to the   x-ray department downstairs for your tests - we will call you with the results when they are available.  If breathing getting worse while arthritis is not, this could be a reaction to your new arthritis medication   Prevnar 13 today  Please schedule a follow up office visit in 4 weeks, sooner if needed with full PFT's

## 2013-08-27 NOTE — Assessment & Plan Note (Signed)
-   Assoc with RA      - PFT's 06/15/2006 FEV1 (1.53)  Ratio 84 and DLC0 46 corrects to 90%     - PFT's  03/04/2011  FEV1 (1.73)  Ratio 83 and DLC0 63% corrects  94%     - 08/27/2013  Walked RA x 2 laps @ 185 ft each stopped due to  Oximeter stopped recording accurately but sats still 96% and no sob  DDx for pulmonary fibrosis  includes idiopathic pulmonary fibrosis, pulmonary fibrosis associated with rheumatologic diseases (which have a relatively benign course in most cases) , adverse effect from  drugs such as chemotherapy or amiodarone exposure, nonspecific interstitial pneumonia which is typically steroid responsive, and chronic hypersensitivity pneumonitis.   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

## 2013-08-27 NOTE — Progress Notes (Signed)
Subjective:     Patient ID: Brianna [name], female   DOB: 1951/02/02  MRN: [id number]    Brief patient profile:  RA = Deveshwar Primary = Dr Juleen China   History of Present Illness  26 yowf quit smoking age 63 with no resp problems then.  Age 64 bad pneumonia.  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   No sign doe but not that active.   Sleeping ok without nocturnal  or early am exac of resp c/o's or need for noct saba.  rec Ok to restart boniva the first of September and every month but if your respiratory or reflux symptoms worsen it's first medication I would stop and take in it's place Reclast IV yearly.     08/27/2013 f/u ov/Elianah Karis re: RA ? Worse ILD  Chief Complaint  Patient presents with  . Follow-up    Wants to discuss med changes and effects on her lungs.  Pt c/o increased SOB with inclines.  Doe x 1st to 2nd floor x  6 weeks since new med for RA but noted abruptly on day and no change since noted and still able to do eliptical fine Arthritis was good control before and after change from plaquenil (stopped due to ? effecting her viz)    No obvious day to day or daytime variabilty or assoc chronic cough or cp or chest tightness, subjective wheeze overt sinus or hb symptoms. No unusual exp hx or h/o childhood pna/ asthma or knowledge of premature birth.  Sleeping ok without nocturnal  or early am exacerbation  of respiratory  c/o's or need for noct saba. Also denies any obvious fluctuation of symptoms with weather or environmental changes or other aggravating or alleviating factors except as outlined above   Current Medications, Allergies, Complete Past Medical History, Past Surgical  History, Family History, and Social History were reviewed in [employer] record.  ROS  The following are not active complaints unless bolded sore throat, dysphagia, dental problems, itching, sneezing,  nasal congestion or excess/ purulent secretions, ear ache,   fever, chills, sweats, unintended wt loss, pleuritic or exertional cp, hemoptysis,  orthopnea pnd or leg swelling, presyncope, palpitations, heartburn, abdominal pain, anorexia, nausea, vomiting, diarrhea  or change in bowel or urinary habits, change in stools or urine, dysuria,hematuria,  rash, arthralgias, visual complaints, headache, numbness weakness or ataxia or problems with walking or coordination,  change in mood/affect or memory.           Objective:   Physical Exam  Amb wf nad  Wt 130 01/14/2011 > 08/27/2013  126   HEENT: nl dentition, turbinates, and orophanx. Nl external ear canals without cough reflex   NECK :  without JVD/Nodes/TM/ nl carotid upstrokes bilaterally   LUNGS: no acc muscle use, mininimal coarsening of bs bilaterally without cough on insp or exp maneuvers   CV:  RRR  no s3 or murmur or increase in P2, no edema   ABD:  soft and nontender with nl excursion in the supine position. No bruits or organomegaly, bowel sounds nl  MS:  warm without deformities, calf tenderness, cyanosis or clubbing  SKIN: warm and dry  without lesions    NEURO:  alert, approp, no deficits  CXR  08/27/2013 :  No active disease. Stable fibrotic changes lung bases.        Assessment:

## 2013-08-27 NOTE — Progress Notes (Signed)
[facility]   Provider:  Dr Hosie Poisson Referring Provider: Darci Needle, MD Primary Care Physician:  Michiel Sites, MD  CC:  botox for chronic migraine  HPI:  Brianna [name] is a 63 y.o. female here for follow up injections for chronic migraine  Has noted some slight improvement, notes a decrease in frequency, typically occuring 2 times a week. Severity has decreased slightly. Continues to get some benefit with the Imitrex but gets rebound headache the next day. Continues to have 2 to 3 headaches per week, similar in nature to prior visit. Has tried Imitrex and phenergan which gives some symptomatic relief but lately the imitrex has not been completely getting rid of the headache.   Contraindications and precautions discussed with patient. Aseptic procedure was observed and patient tolerated procedure. Procedure performed by Dr. Elspeth Cho.  The condition has existed for more than 6 months, and pt does not have a diagnosis of ALS, Myasthenia Gravis or Lambert-Eaton Syndrome.  Risks and benefits of injections discussed and pt agrees to proceed with the procedure.  Written consent obtained These injections are medically necessary. He receives good benefits from these injections. These injections do not cause sedations or hallucinations which the oral therapies may cause.  Indication/Diagnosis:chronic migraine  Type of toxin:Botox  Lot #[id number]  Expiration date: August 2017  Injection sites:  Muscle Site    L   R  Corrugator    5   5  Procerus        5  Frontalis    5x2   5x2   Temporalis    5x4   5x4    Occipitalis    5x3   5x3  Trapezius    5x3   5x3  Cervical paraspinals   5x2   5x2  History   Social History  . Marital Status: Married    Spouse Name: [name]    Number of Children: 2  . Years of Education: Bachelor   Occupational History  . Teacher   . [employer]    Social History Main Topics  . Smoking status: Former Smoker --  0.50 packs/day for 14 years    Types: Cigarettes    Quit date: 07/19/1988  . Smokeless tobacco: Never Used  . Alcohol Use: Yes     Comment: very rare- "gives me migraines"  . Drug Use: No  . Sexual Activity: Not on file   Other Topics Concern  . Not on file   Social History Narrative   Patient is married to [name]), has 2 children   Patient is right handed   Education level is Bachelor's degree   Caffeine consumption is 2 cups daily    Family History  Problem Relation Age of Onset  . Heart disease Mother   . Heart disease Father   . Lung disease Mother     ? disease process  . Uterine cancer Mother   . Cervical cancer Maternal Aunt   . Prostate cancer Maternal Grandfather   . Clotting disorder Father   . Rheum arthritis Sister   . Rheum arthritis Maternal Grandmother     Past Medical History  Diagnosis Date  . Hypertension   . Hyperlipidemia   . Migraines   . Epilepsy   . Raynaud's disease   . Rheumatoid arthritis(714.0)     Past Surgical History  Procedure Laterality Date  . Carotid artery angioplasty  2008    Dr Madilyn Fireman  . Corneal transplant  x 5   . Cholecystectomy  1995    Current Outpatient Prescriptions  Medication Sig Dispense Refill  . amLODipine (NORVASC) 5 MG tablet Take 5 mg by mouth daily.        Marland Kitchen atorvastatin (LIPITOR) 40 MG tablet Take 40 mg by mouth daily.        . Calcium Carbonate-Vit D-Min (CALCIUM 1200 PO) Take 2 capsules by mouth daily.        . cefUROXime (CEFTIN) 500 MG tablet       . Cholecalciferol (VITAMIN D PO) Take by mouth daily.      . cyclobenzaprine (FLEXERIL) 10 MG tablet       . ESTRACE VAGINAL 0.1 MG/GM vaginal cream       . ezetimibe (ZETIA) 10 MG tablet Take 10 mg by mouth daily.        Marland Kitchen GAVILYTE-G 236 G solution       . hydroxychloroquine (PLAQUENIL) 200 MG tablet Take 200 mg by mouth daily.        Marland Kitchen ibandronate (BONIVA) 150 MG tablet Take 150 mg by mouth every 30 (thirty) days. Take in the morning with a full  glass of water, on an empty stomach, and do not take anything else by mouth or lie down for the next 30 min.       . lansoprazole (PREVACID) 15 MG capsule Take 15 mg by mouth daily.        Marland Kitchen leflunomide (ARAVA) 10 MG tablet       . LORazepam (ATIVAN) 0.5 MG tablet Take 0.5 mg by mouth as needed.        . meloxicam (MOBIC) 15 MG tablet       . neomycin-polymyxin-hydrocortisone (CORTISPORIN) 3.5-10000-1 otic suspension       . phenytoin (DILANTIN) 300 MG ER capsule Take 300 mg by mouth daily.        . prednisoLONE acetate (PRED FORTE) 1 % ophthalmic suspension       . promethazine (PHENERGAN) 25 MG tablet Take 25 mg by mouth every 6 (six) hours as needed.        . sertraline (ZOLOFT) 50 MG tablet Take 50 mg by mouth daily.        . SUMAtriptan (IMITREX) 6 MG/0.5ML SOLN Inject 6 mg into the skin as needed.        . timolol (TIMOPTIC) 0.5 % ophthalmic solution       . topiramate (TOPAMAX) 25 MG capsule Take 3 capsules (75 mg total) by mouth daily.  90 capsule  6  . famotidine (PEPCID) 20 MG tablet One at bedtime       Current Facility-Administered Medications  Medication Dose Route Frequency Provider Last Rate Last Dose  . botulinum toxin Type A (BOTOX) injection 200 Units  200 Units Intramuscular Once Omelia Blackwater, DO      . pneumococcal 23 valent vaccine (PNU-IMMUNE) injection 0.5 mL  0.5 mL Intramuscular Once Nyoka Cowden, MD        Allergies as of 08/27/2013 - Review Complete 08/27/2013  Allergen Reaction Noted  . Morphine and related Shortness Of Breath 01/14/2011  . Sulfa antibiotics Rash 01/14/2011    Vitals: BP 159/90  Pulse 75  Ht 5\' 2"  (1.575 m)  Wt 125 lb (56.7 kg)  BMI 22.86 kg/m2 Last Weight:  Wt Readings from Last 1 Encounters:  08/27/13 125 lb (56.7 kg)   Last Height:   Ht Readings from Last 1 Encounters:  08/27/13 5\' 2"  (1.575 m)  Assessment:  After physical and neurologic examination, review of laboratory studies, imaging, neurophysiology testing  and pre-existing records, assessment will be reviewed on the problem list.  Plan:  Treatment plan and additional workup will be reviewed under Problem List.  Brianna [name] is a 63 y.o. female here for botox injections for chronic migraine headache  1) Botox injections as detailed above. A total of 155 units was used. 45 units wasted 2) Tylenol or Motrin for injection site pain. 3) Medication guide dispensed. 4) Follow up for repeat injections in 3 months

## 2013-09-03 NOTE — Progress Notes (Signed)
Quick Note:  LMTCB ______ 

## 2013-09-04 ENCOUNTER — Other Ambulatory Visit: Payer: Self-pay | Admitting: Vascular Surgery

## 2013-09-04 DIAGNOSIS — I6529 Occlusion and stenosis of unspecified carotid artery: Secondary | ICD-10-CM

## 2013-09-04 DIAGNOSIS — Z48812 Encounter for surgical aftercare following surgery on the circulatory system: Secondary | ICD-10-CM

## 2013-09-05 ENCOUNTER — Telehealth: Payer: Self-pay | Admitting: Internal Medicine

## 2013-09-05 NOTE — Telephone Encounter (Signed)
Advised pt of cxr result per MW.  Pt verbalized understanding and has no further questions

## 2013-10-09 ENCOUNTER — Ambulatory Visit: Payer: BC Managed Care – PPO | Admitting: Internal Medicine

## 2013-10-31 DIAGNOSIS — T8691 Unspecified transplanted organ and tissue rejection: Secondary | ICD-10-CM | POA: Insufficient documentation

## 2013-11-12 ENCOUNTER — Other Ambulatory Visit: Payer: Self-pay | Admitting: Internal Medicine

## 2013-11-12 DIAGNOSIS — J8410 Pulmonary fibrosis, unspecified: Secondary | ICD-10-CM

## 2013-11-13 ENCOUNTER — Encounter (INDEPENDENT_AMBULATORY_CARE_PROVIDER_SITE_OTHER): Payer: Self-pay

## 2013-11-13 ENCOUNTER — Encounter: Payer: Self-pay | Admitting: Internal Medicine

## 2013-11-13 ENCOUNTER — Ambulatory Visit (INDEPENDENT_AMBULATORY_CARE_PROVIDER_SITE_OTHER): Payer: BC Managed Care – PPO | Admitting: Internal Medicine

## 2013-11-13 VITALS — BP 120/80 | HR 77 | Temp 97.9°F | Ht 61.0 in | Wt 125.0 lb

## 2013-11-13 DIAGNOSIS — J8410 Pulmonary fibrosis, unspecified: Secondary | ICD-10-CM

## 2013-11-13 LAB — PULMONARY FUNCTION TEST
DL/VA % pred: 102 %
DL/VA: 4.52 ml/min/mmHg/L
DLCO unc % pred: 65 %
DLCO unc: 13.26 ml/min/mmHg
FEF 25-75 Post: 2.07 L/sec
FEF 25-75 Pre: 1.8 L/sec
FEF2575-%Change-Post: 15 %
FEF2575-%Pred-Post: 97 %
FEF2575-%Pred-Pre: 84 %
FEV1-%Change-Post: 5 %
FEV1-%Pred-Post: 75 %
FEV1-%Pred-Pre: 71 %
FEV1-Post: 1.69 L
FEV1-Pre: 1.61 L
FEV1FVC-%Change-Post: 3 %
FEV1FVC-%Pred-Pre: 108 %
FEV6-%Change-Post: 1 %
FEV6-%Pred-Post: 68 %
FEV6-%Pred-Pre: 67 %
FEV6-Post: 1.93 L
FEV6-Pre: 1.9 L
FEV6FVC-%Pred-Post: 104 %
FEV6FVC-%Pred-Pre: 104 %
FVC-%Change-Post: 1 %
FVC-%Pred-Post: 65 %
FVC-%Pred-Pre: 65 %
FVC-Post: 1.93 L
FVC-Pre: 1.9 L
Post FEV1/FVC ratio: 88 %
Post FEV6/FVC ratio: 100 %
Pre FEV1/FVC ratio: 85 %
Pre FEV6/FVC Ratio: 100 %
RV % pred: 59 %
RV: 1.11 L
TLC % pred: 66 %
TLC: 3.05 L

## 2013-11-13 NOTE — Progress Notes (Signed)
PFT done today. 

## 2013-11-13 NOTE — Patient Instructions (Signed)
Return in one year for pfts and cxr and call sooner if not decline in tolerance

## 2013-11-13 NOTE — Progress Notes (Signed)
Subjective:     Patient ID: Brianna [name], female   DOB: 1951-02-08  MRN: [id number]    Brief patient profile:  RA = Deveshwar Primary = Dr Juleen China   History of Present Illness  26 yowf quit smoking age 63 with no resp problems then.  Age 63 bad pneumonia.  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   01/14/2011  Initial pulmonary office eval in EMR era cc  chest congestion worse in am's with minimal white mucus seems better in afternoon and flares when lie down at hs - first noted with sinus infection rx with steroid shot and then abx improved some.   No sign doe but not that active.   Sleeping ok without nocturnal  or early am exac of resp c/o's or need for noct saba.  rec Ok to restart boniva the first of September and every month but if your respiratory or reflux symptoms worsen it's first medication I would stop and take in it's place Reclast IV yearly.     08/27/2013 f/u ov/July Nickson re: RA ? Worse ILD  Chief Complaint  Patient presents with  . Follow-up    Wants to discuss med changes and effects on her lungs.  Pt c/o increased SOB with inclines.  Doe x 1st to 2nd floor x  6 weeks since new med for RA but noted abruptly on day and no change since noted and still able to do eliptical fine Arthritis was good control before and after change from plaquenil (stopped due to ? effecting her viz) rec Prevnar 13 today    11/13/2013 f/u ov/Cashius Grandstaff re:  PF assoc with RA Chief Complaint  Patient presents with  . Followup with PFT    Pt states that her breathing is unchanged since her last visit. No new co's today.    can now do 1st to 2nd floor s sob and working out regularly s limits due to sob.      No obvious day to day or daytime variabilty or assoc chronic cough or cp or chest tightness, subjective wheeze overt sinus or hb symptoms. No unusual exp hx or h/o childhood pna/  asthma or knowledge of premature birth.  Sleeping ok without nocturnal  or early am exacerbation  of respiratory  c/o's or need for noct saba. Also denies any obvious fluctuation of symptoms with weather or environmental changes or other aggravating or alleviating factors except as outlined above   Current Medications, Allergies, Complete Past Medical History, Past Surgical History, Family History, and Social History were reviewed in [employer] record.  ROS  The following are not active complaints unless bolded sore throat, dysphagia, dental problems, itching, sneezing,  nasal congestion or excess/ purulent secretions, ear ache,   fever, chills, sweats, unintended wt loss, pleuritic or exertional cp, hemoptysis,  orthopnea pnd or leg swelling, presyncope, palpitations, heartburn, abdominal pain, anorexia, nausea, vomiting, diarrhea  or change in bowel or urinary habits, change in stools or urine, dysuria,hematuria,  rash, arthralgias, visual complaints, headache, numbness weakness or ataxia or problems with walking or coordination,  change in mood/affect or memory.           Objective:   Physical Exam  Amb wf nad  Wt 130 01/14/2011 > 08/27/2013  126 >  11/13/2013 125   HEENT: nl dentition, turbinates, and orophanx. Nl external ear canals without cough reflex  NECK :  without JVD/Nodes/TM/ nl carotid upstrokes bilaterally   LUNGS: no acc muscle use, mininimal coarsening of bs bilaterally with bronchovesicular changes with cough on deep insp   maneuvers   CV:  RRR  no s3 or murmur or increase in P2, no edema   ABD:  soft and nontender with nl excursion in the supine position. No bruits or organomegaly, bowel sounds nl  MS:  warm without deformities, calf tenderness, cyanosis or clubbing  SKIN: warm and dry without lesions    NEURO:  alert, approp, no deficits  CXR  08/27/2013 :  No active disease. Stable fibrotic changes lung bases.        Assessment:

## 2013-11-13 NOTE — Assessment & Plan Note (Signed)
-   Assoc with RA      - PFT's 06/15/2006 FEV1 (1.53)  Ratio 84 and DLC0 46 corrects to 90%     - PFT's  03/04/2011  FEV1 (1.73)  Ratio 83 and VC 2.11 and DLC0 63% corrects  94%     - PFT's 11/13/2013    VC 1.94 with DLCO 65 corrects to 102%     - 08/27/2013  Walked RA x 2 laps @ 185 ft each stopped due to  Oximeter stopped recording accurately but sats still 96% and no sob  I had an extended discussion with the patient today lasting 15 to 20 minutes of a 25 minute visit on the following issues:  ILD assoc with RA and no sign progression over time typical of PF assoc with RA (vs UIP or other forms of progressive PF) > prognosis is very good, f/u can be yearly, sooner prn  If arthritis flares with sob/cough it's most likely because systemic control is suboptimal so needs to follow closely with Rheumatology

## 2013-11-27 ENCOUNTER — Encounter: Payer: Self-pay | Admitting: Neurology

## 2013-11-27 ENCOUNTER — Ambulatory Visit (INDEPENDENT_AMBULATORY_CARE_PROVIDER_SITE_OTHER): Payer: BC Managed Care – PPO | Admitting: Neurology

## 2013-11-27 VITALS — BP 167/102 | HR 79 | Wt 120.8 lb

## 2013-11-27 DIAGNOSIS — G43719 Chronic migraine without aura, intractable, without status migrainosus: Secondary | ICD-10-CM

## 2013-11-27 DIAGNOSIS — G43709 Chronic migraine without aura, not intractable, without status migrainosus: Secondary | ICD-10-CM

## 2013-11-27 DIAGNOSIS — IMO0002 Reserved for concepts with insufficient information to code with codable children: Secondary | ICD-10-CM

## 2013-11-27 MED ORDER — ONABOTULINUMTOXINA 100 UNITS IJ SOLR: 200.0000 [IU] | Freq: Once | INTRAMUSCULAR | Status: DC

## 2013-11-27 NOTE — Progress Notes (Signed)
[facility]   Provider:  Dr Hosie Poisson Referring Provider: Darci Needle, MD Primary Care Physician:  Michiel Sites, MD  CC:  botox for chronic migraine  HPI:  Brianna [name] is a 63 y.o. female here for follow up injections for chronic migraine  Has noted some improvement, notes a decrease in frequency, now occuring 2 times a week. Severity has decreased slightly. Continues to get some benefit with the Imitrex but gets rebound headache the next day. 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    Contraindications and precautions discussed with patient. Aseptic procedure was observed and patient tolerated procedure. Procedure performed by Dr. Elspeth Cho.  The condition has existed for more than 6 months, and pt does not have a diagnosis of ALS, Myasthenia Gravis or Lambert-Eaton Syndrome.  Risks and benefits of injections discussed and pt agrees to proceed with the procedure.  Written consent obtained These injections are medically necessary. He receives good benefits from these injections. These injections do not cause sedations or hallucinations which the oral therapies may cause.  Indication/Diagnosis:chronic migraine  Type of toxin:Botox  Lot #[id number]  Expiration date: August 2017  Injection sites:  Muscle Site    L   R  Corrugator     5    5  Procerus         5  Frontalis    5x2   5x2   Temporalis    5x4   5x4    Occipitalis    5x3   5x3  Trapezius    5x3   5x3  Cervical paraspinals  5x2   5x2  History   Social History  . Marital Status: Married    Spouse Name: [name]    Number of Children: 2  . Years of Education: Bachelor   Occupational History  . Teacher   . [employer]    Social History Main Topics  . Smoking status: Former Smoker -- 0.50 packs/day for 14 years    Types: Cigarettes    Quit date: 07/19/1988  . Smokeless tobacco: Never Used  .  Alcohol Use: Yes     Comment: very rare- "gives me migraines"  . Drug Use: No  . Sexual Activity: Not on file   Other Topics Concern  . Not on file   Social History Narrative   Patient is married to [name]), has 2 children   Patient is right handed   Education level is Bachelor's degree   Caffeine consumption is 2 cups daily    Family History  Problem Relation Age of Onset  . Heart disease Mother   . Heart disease Father   . Lung disease Mother     ? disease process  . Uterine cancer Mother   . Cervical cancer Maternal Aunt   . Prostate cancer Maternal Grandfather   . Clotting disorder Father   . Rheum arthritis Sister   . Rheum arthritis Maternal Grandmother     Past Medical History  Diagnosis Date  . Hypertension   . Hyperlipidemia   . Migraines   . Epilepsy   . Raynaud's disease   . Rheumatoid arthritis(714.0)     Past Surgical History  Procedure Laterality Date  . Carotid artery angioplasty  2008    Dr Madilyn Fireman  . Corneal transplant      x 5   . Cholecystectomy  1995    Current Outpatient Prescriptions  Medication Sig  Dispense Refill  . amLODipine (NORVASC) 5 MG tablet Take 5 mg by mouth daily.        Marland Kitchen atorvastatin (LIPITOR) 40 MG tablet Take 40 mg by mouth daily.        . Calcium Carbonate-Vit D-Min (CALCIUM 1200 PO) Take 2 capsules by mouth daily.        . cefUROXime (CEFTIN) 500 MG tablet Take 500 mg by mouth daily.       . Cholecalciferol (VITAMIN D PO) Take by mouth daily.      . cyclobenzaprine (FLEXERIL) 10 MG tablet Take 10 mg by mouth 3 (three) times daily as needed.       . dorzolamide-timolol (COSOPT) 22.3-6.8 MG/ML ophthalmic solution       . ezetimibe (ZETIA) 10 MG tablet Take 10 mg by mouth daily.        Marland Kitchen GAVILYTE-G 236 G solution       . lansoprazole (PREVACID) 15 MG capsule Take 15 mg by mouth daily.        Marland Kitchen leflunomide (ARAVA) 10 MG tablet Take 10 mg by mouth daily.      Marland Kitchen LORazepam (ATIVAN) 0.5 MG tablet Take 0.5 mg by mouth as  needed.        . phenytoin (DILANTIN) 300 MG ER capsule Take 300 mg by mouth daily.        . prednisoLONE acetate (PRED FORTE) 1 % ophthalmic suspension 8 drops daily as directed      . promethazine (PHENERGAN) 25 MG tablet Take 25 mg by mouth every 6 (six) hours as needed.        . sertraline (ZOLOFT) 50 MG tablet Take 50 mg by mouth daily.        . SUMAtriptan (IMITREX) 6 MG/0.5ML SOLN Inject 6 mg into the skin as needed.        . timolol (TIMOPTIC) 0.5 % ophthalmic solution Place 1 drop into both eyes 2 (two) times daily.       Marland Kitchen topiramate (TOPAMAX) 25 MG capsule Take 3 capsules (75 mg total) by mouth daily.  90 capsule  6   Current Facility-Administered Medications  Medication Dose Route Frequency Provider Last Rate Last Dose  . botulinum toxin Type A (BOTOX) injection 200 Units  200 Units Intramuscular Once Omelia Blackwater, DO      . botulinum toxin Type A (BOTOX) injection 200 Units  200 Units Intramuscular Once Omelia Blackwater, DO      . pneumococcal 23 valent vaccine (PNU-IMMUNE) injection 0.5 mL  0.5 mL Intramuscular Once Nyoka Cowden, MD        Allergies as of 11/27/2013 - Review Complete 11/27/2013  Allergen Reaction Noted  . Morphine and related Shortness Of Breath 01/14/2011  . Sulfa antibiotics Rash 01/14/2011    Vitals: BP 167/102  Pulse 79  Wt 120 lb 12 oz (54.772 kg) Last Weight:  Wt Readings from Last 1 Encounters:  11/27/13 120 lb 12 oz (54.772 kg)   Last Height:   Ht Readings from Last 1 Encounters:  11/13/13 5\' 1"  (1.549 m)      Assessment:  After physical and neurologic examination, review of laboratory studies, imaging, neurophysiology testing and pre-existing records, assessment will be reviewed on the problem list.  Plan:  Treatment plan and additional workup will be reviewed under Problem List.  Brianna [name] is a 63 y.o. female here for botox injections for chronic migraine headache  1) Botox injections as detailed above. A total of  155 units was used. 45 units wasted 2) Tylenol or Motrin for injection site pain. 3) Medication guide dispensed. 4) Follow up for repeat injections in 3 months

## 2013-12-19 ENCOUNTER — Other Ambulatory Visit: Payer: Self-pay | Admitting: Neurology

## 2013-12-20 ENCOUNTER — Other Ambulatory Visit (HOSPITAL_COMMUNITY): Payer: Self-pay | Admitting: Rheumatology

## 2013-12-20 DIAGNOSIS — R772 Abnormality of alphafetoprotein: Secondary | ICD-10-CM

## 2013-12-20 DIAGNOSIS — R748 Abnormal levels of other serum enzymes: Secondary | ICD-10-CM

## 2013-12-26 ENCOUNTER — Ambulatory Visit (HOSPITAL_COMMUNITY)
Admission: RE | Admit: 2013-12-26 | Discharge: 2013-12-26 | Disposition: A | Discharge status: Home / Self Care | Payer: BC Managed Care – PPO | Source: Ambulatory Visit | Attending: Rheumatology | Admitting: Rheumatology

## 2013-12-26 ENCOUNTER — Encounter (HOSPITAL_COMMUNITY)
Admission: RE | Admit: 2013-12-26 | Discharge: 2013-12-26 | Disposition: A | Discharge status: Home / Self Care | Payer: BC Managed Care – PPO | Source: Ambulatory Visit | Attending: Rheumatology | Admitting: Rheumatology

## 2013-12-26 DIAGNOSIS — M549 Dorsalgia, unspecified: Secondary | ICD-10-CM | POA: Insufficient documentation

## 2013-12-26 DIAGNOSIS — R748 Abnormal levels of other serum enzymes: Secondary | ICD-10-CM | POA: Insufficient documentation

## 2013-12-26 DIAGNOSIS — W19XXXA Unspecified fall, initial encounter: Secondary | ICD-10-CM | POA: Insufficient documentation

## 2013-12-26 DIAGNOSIS — M47817 Spondylosis without myelopathy or radiculopathy, lumbosacral region: Secondary | ICD-10-CM | POA: Insufficient documentation

## 2013-12-26 MED ORDER — TECHNETIUM TC 99M MEDRONATE IV KIT
25.5000 | PACK | Freq: Once | INTRAVENOUS | Status: AC | PRN
  Administered 2013-12-26: 25.5 via INTRAVENOUS

## 2014-01-22 HISTORY — PX: CORNEAL TRANSPLANT: SHX108

## 2014-02-07 ENCOUNTER — Other Ambulatory Visit: Payer: Self-pay | Admitting: Emergency Medicine

## 2014-02-07 DIAGNOSIS — N63 Unspecified lump in unspecified breast: Secondary | ICD-10-CM

## 2014-02-13 ENCOUNTER — Inpatient Hospital Stay: Admission: RE | Admit: 2014-02-13 | Payer: BC Managed Care – PPO | Source: Ambulatory Visit

## 2014-02-13 ENCOUNTER — Other Ambulatory Visit: Payer: BC Managed Care – PPO

## 2014-02-14 ENCOUNTER — Ambulatory Visit: Payer: BC Managed Care – PPO | Admitting: Vascular Surgery

## 2014-02-14 ENCOUNTER — Other Ambulatory Visit (HOSPITAL_COMMUNITY): Payer: BC Managed Care – PPO

## 2014-02-19 ENCOUNTER — Ambulatory Visit
Admission: RE | Admit: 2014-02-19 | Discharge: 2014-02-19 | Disposition: A | Discharge status: Home / Self Care | Payer: BC Managed Care – PPO | Source: Ambulatory Visit | Attending: Emergency Medicine | Admitting: Emergency Medicine

## 2014-02-19 ENCOUNTER — Other Ambulatory Visit: Payer: Self-pay | Admitting: Emergency Medicine

## 2014-02-19 DIAGNOSIS — N63 Unspecified lump in unspecified breast: Secondary | ICD-10-CM

## 2014-02-20 ENCOUNTER — Other Ambulatory Visit: Payer: BC Managed Care – PPO

## 2014-02-26 ENCOUNTER — Ambulatory Visit (INDEPENDENT_AMBULATORY_CARE_PROVIDER_SITE_OTHER): Payer: BC Managed Care – PPO | Admitting: Neurology

## 2014-02-26 ENCOUNTER — Encounter: Payer: Self-pay | Admitting: Neurology

## 2014-02-26 VITALS — BP 141/87 | HR 71 | Ht 62.5 in | Wt 126.0 lb

## 2014-02-26 DIAGNOSIS — IMO0002 Reserved for concepts with insufficient information to code with codable children: Secondary | ICD-10-CM

## 2014-02-26 DIAGNOSIS — G43709 Chronic migraine without aura, not intractable, without status migrainosus: Secondary | ICD-10-CM

## 2014-02-26 DIAGNOSIS — G43719 Chronic migraine without aura, intractable, without status migrainosus: Secondary | ICD-10-CM

## 2014-02-26 MED ORDER — ONABOTULINUMTOXINA 100 UNITS IJ SOLR: 200.0000 [IU] | Freq: Once | INTRAMUSCULAR | Status: DC

## 2014-02-26 NOTE — Progress Notes (Signed)
[facility]   Provider:  Dr Hosie Poisson Referring Provider: Darci Needle, MD Primary Care Physician:  Michiel Sites, MD  CC:  botox for chronic migraine  HPI:  Brianna [name] is a 63 y.o. female here for follow up injections for chronic migraine  Has noted some improvement, notes a decrease in frequency, now occuring 2 times a week. Severity has decreased slightly. Continues to get some benefit with the Imitrex but gets rebound headache the next day. 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    Contraindications and precautions discussed with patient. Aseptic procedure was observed and patient tolerated procedure. Procedure performed by Dr. Elspeth Cho.  The condition has existed for more than 6 months, and pt does not have a diagnosis of ALS, Myasthenia Gravis or Lambert-Eaton Syndrome.  Risks and benefits of injections discussed and pt agrees to proceed with the procedure.  Written consent obtained These injections are medically necessary. He receives good benefits from these injections. These injections do not cause sedations or hallucinations which the oral therapies may cause.  Indication/Diagnosis:chronic migraine  Type of toxin:Botox  Lot #[id number]  Expiration date:Jan 2018  Injection sites:  Muscle Site    L   R  Corrugator     5    5  Procerus         5  Frontalis    5x2   5x2   Temporalis    5x4   5x4    Occipitalis    5x3   5x3  Trapezius    5x3   5x3  Cervical paraspinals  5x2   5x2  History   Social History  . Marital Status: Married    Spouse Name: [name]    Number of Children: 2  . Years of Education: Bachelor   Occupational History  . Teacher   . [employer]    Social History Main Topics  . Smoking status: Former Smoker -- 0.50 packs/day for 14 years    Types: Cigarettes    Quit date: 07/19/1988  . Smokeless tobacco: Never Used  .  Alcohol Use: Yes     Comment: very rare- "gives me migraines"  . Drug Use: No  . Sexual Activity: Not on file   Other Topics Concern  . Not on file   Social History Narrative   Patient is married to [name]), has 2 children   Patient is right handed   Education level is Bachelor's degree   Caffeine consumption is 2 cups daily    Family History  Problem Relation Age of Onset  . Heart disease Mother   . Heart disease Father   . Lung disease Mother     ? disease process  . Uterine cancer Mother   . Cervical cancer Maternal Aunt   . Prostate cancer Maternal Grandfather   . Clotting disorder Father   . Rheum arthritis Sister   . Rheum arthritis Maternal Grandmother     Past Medical History  Diagnosis Date  . Hypertension   . Hyperlipidemia   . Migraines   . Epilepsy   . Raynaud's disease   . Rheumatoid arthritis(714.0)     Past Surgical History  Procedure Laterality Date  . Carotid artery angioplasty  2008    Dr Madilyn Fireman  . Corneal transplant      x 5   . Cholecystectomy  1995    Current Outpatient Prescriptions  Medication Sig  Dispense Refill  . amLODipine (NORVASC) 5 MG tablet Take 5 mg by mouth daily.        Marland Kitchen atorvastatin (LIPITOR) 40 MG tablet Take 40 mg by mouth daily.        . Calcium Carbonate-Vit D-Min (CALCIUM 1200 PO) Take 2 capsules by mouth daily.        . cefUROXime (CEFTIN) 500 MG tablet Take 500 mg by mouth daily.       . Cholecalciferol (VITAMIN D PO) Take by mouth daily.      . cyclobenzaprine (FLEXERIL) 10 MG tablet Take 10 mg by mouth 3 (three) times daily as needed.       . dorzolamide-timolol (COSOPT) 22.3-6.8 MG/ML ophthalmic solution       . ezetimibe (ZETIA) 10 MG tablet Take 10 mg by mouth daily.        Marland Kitchen GAVILYTE-G 236 G solution       . lansoprazole (PREVACID) 15 MG capsule Take 15 mg by mouth daily.        Marland Kitchen leflunomide (ARAVA) 10 MG tablet Take 10 mg by mouth daily.      Marland Kitchen LORazepam (ATIVAN) 0.5 MG tablet Take 0.5 mg by mouth as  needed.        . phenytoin (DILANTIN) 300 MG ER capsule Take 300 mg by mouth daily.        . prednisoLONE acetate (PRED FORTE) 1 % ophthalmic suspension 8 drops daily as directed      . promethazine (PHENERGAN) 25 MG tablet Take 25 mg by mouth every 6 (six) hours as needed.        . sertraline (ZOLOFT) 50 MG tablet Take 50 mg by mouth daily.        . SUMAtriptan (IMITREX) 6 MG/0.5ML SOLN Inject 6 mg into the skin as needed.        . timolol (TIMOPTIC) 0.5 % ophthalmic solution Place 1 drop into both eyes 2 (two) times daily.       Marland Kitchen topiramate (TOPAMAX) 25 MG capsule TAKE 3 CAPSULES BY MOUTH EVERY DAY  90 capsule  6   Current Facility-Administered Medications  Medication Dose Route Frequency Provider Last Rate Last Dose  . botulinum toxin Type A (BOTOX) injection 200 Units  200 Units Intramuscular Once Omelia Blackwater, DO      . botulinum toxin Type A (BOTOX) injection 200 Units  200 Units Intramuscular Once Omelia Blackwater, DO      . botulinum toxin Type A (BOTOX) injection 200 Units  200 Units Intramuscular Once Omelia Blackwater, DO      . pneumococcal 23 valent vaccine (PNU-IMMUNE) injection 0.5 mL  0.5 mL Intramuscular Once Nyoka Cowden, MD        Allergies as of 02/26/2014 - Review Complete 02/26/2014  Allergen Reaction Noted  . Morphine and related Shortness Of Breath 01/14/2011  . Sulfa antibiotics Rash 01/14/2011    Vitals: BP 141/87  Pulse 71  Ht 5' 2.5" (1.588 m)  Wt 126 lb (57.153 kg)  BMI 22.66 kg/m2 Last Weight:  Wt Readings from Last 1 Encounters:  02/26/14 126 lb (57.153 kg)   Last Height:   Ht Readings from Last 1 Encounters:  02/26/14 5' 2.5" (1.588 m)      Assessment:  After physical and neurologic examination, review of laboratory studies, imaging, neurophysiology testing and pre-existing records, assessment will be reviewed on the problem list.  Plan:  Treatment plan and additional workup will be reviewed under Problem List.  Brianna [name] is a 63 y.o. female here for botox injections for chronic migraine headache  1) Botox injections as detailed above. A total of 155 units was used. 45 units wasted 2) Tylenol or Motrin for injection site pain. 3) Medication guide dispensed. 4) Follow up for repeat injections in 3 months. Patient to follow up with Dr Artemio Aly

## 2014-03-07 ENCOUNTER — Other Ambulatory Visit (HOSPITAL_COMMUNITY): Payer: BC Managed Care – PPO

## 2014-03-07 ENCOUNTER — Ambulatory Visit: Payer: BC Managed Care – PPO | Admitting: Family

## 2014-03-20 ENCOUNTER — Encounter: Payer: Self-pay | Admitting: Vascular Surgery

## 2014-03-21 ENCOUNTER — Ambulatory Visit: Payer: BC Managed Care – PPO | Admitting: Vascular Surgery

## 2014-03-21 ENCOUNTER — Other Ambulatory Visit (HOSPITAL_COMMUNITY): Payer: BC Managed Care – PPO

## 2014-04-18 HISTORY — PX: OTHER SURGICAL HISTORY: SHX169

## 2014-04-29 ENCOUNTER — Other Ambulatory Visit: Payer: Self-pay | Admitting: Obstetrics and Gynecology

## 2014-04-30 LAB — CYTOLOGY - PAP

## 2014-05-30 ENCOUNTER — Ambulatory Visit: Payer: BC Managed Care – PPO | Admitting: Neurology

## 2014-06-07 ENCOUNTER — Encounter: Payer: Self-pay | Admitting: Neurology

## 2014-06-07 DIAGNOSIS — H02429 Myogenic ptosis of unspecified eyelid: Secondary | ICD-10-CM | POA: Insufficient documentation

## 2014-06-07 DIAGNOSIS — H02423 Myogenic ptosis of bilateral eyelids: Secondary | ICD-10-CM | POA: Insufficient documentation

## 2014-06-24 ENCOUNTER — Other Ambulatory Visit (HOSPITAL_COMMUNITY): Payer: BC Managed Care – PPO

## 2014-06-26 ENCOUNTER — Encounter: Payer: Self-pay | Admitting: Vascular Surgery

## 2014-06-27 ENCOUNTER — Ambulatory Visit (INDEPENDENT_AMBULATORY_CARE_PROVIDER_SITE_OTHER): Payer: BC Managed Care – PPO | Admitting: Vascular Surgery

## 2014-06-27 ENCOUNTER — Encounter: Payer: Self-pay | Admitting: Vascular Surgery

## 2014-06-27 ENCOUNTER — Ambulatory Visit (HOSPITAL_COMMUNITY)
Admission: RE | Admit: 2014-06-27 | Discharge: 2014-06-27 | Disposition: A | Discharge status: Home / Self Care | Payer: BC Managed Care – PPO | Source: Ambulatory Visit | Attending: Vascular Surgery | Admitting: Vascular Surgery

## 2014-06-27 VITALS — BP 135/71 | HR 92 | Resp 18 | Ht 62.25 in | Wt 125.2 lb

## 2014-06-27 DIAGNOSIS — I6522 Occlusion and stenosis of left carotid artery: Secondary | ICD-10-CM

## 2014-06-27 DIAGNOSIS — I6523 Occlusion and stenosis of bilateral carotid arteries: Secondary | ICD-10-CM | POA: Diagnosis not present

## 2014-06-27 DIAGNOSIS — Z9889 Other specified postprocedural states: Secondary | ICD-10-CM

## 2014-06-27 DIAGNOSIS — Z48812 Encounter for surgical aftercare following surgery on the circulatory system: Secondary | ICD-10-CM

## 2014-06-27 DIAGNOSIS — I6529 Occlusion and stenosis of unspecified carotid artery: Secondary | ICD-10-CM

## 2014-06-27 NOTE — Progress Notes (Signed)
    Established Carotid Patient  History of Present Illness  Brianna [name] is a 63 y.o. ([birth year]) female who presents with chief complaint: annual follow up s/p left carotid endarterectomy 11/15/2007  Previous carotid studies from 02/05/2013 demonstrated: RICA 40% stenosis, LICA patent. Since her last office visit one year ago, she has had no history of TIA or stroke symptoms. She denies amaurosis fugax or monocular blindness.  The patient has not had facial drooping or hemiplegia.  The patient has not had receptive or expressive aphasia.    [name] PMH, PSH, SH, FamHx, Med, and Allergies are unchanged from 02/05/2013.   Physical Examination  Filed Vitals:   06/27/14 1426  BP: 135/71  Pulse: 92  Resp: 18  Height: 5' 2.25" (1.581 m)  Weight: 125 lb 3.2 oz (56.79 kg)   Body mass index is 22.72 kg/(m^2).  General: A&O x 3, WDWN female in NAD  Neck: Supple  Pulmonary: Sym exp, good air movt, CTAB, no rales, rhonchi, & wheezing   Cardiac: RRR, Nl S1, S2, no Murmurs, rubs or gallops, no carotid bruits  Musculoskeletal: M/S 5/5 throughout.   Neurologic: CN 2-12 grossly intact.   Non-Invasive Vascular Imaging  CAROTID DUPLEX (Date: 06/27/14):   R ICA stenosis: <40%  R VA:  patent and antegrade  L ICA stenosis: widely patent  L VA:  patent and antegrade  Medical Decision Making  Brianna [name] is a 63 y.o. female who is s/p left carotid endarterectomy 11/15/2007.    Her carotid duplex today reveals a widely patent left carotid endarterectomy without evidence of restenosis or hyperplasia. Her right internal carotid artery has evidence of <40% stenosis.   She has not had any acute signs of symptoms of stroke/TIA.  She is taking a daily aspirin and statin. She was previously on plavix pre-operatively for carotid disease.  She will follow up in one year with bilateral carotid duplex. Was advised to follow up sooner if she experienced any acute neurological events.    Brianna Berger, PA-C [facility] Office: [phone] Pager: [phone]  06/27/2014, 2:50 PM  This patient was seen in conjunction with Dr. Darrick Penna  History and exam findings as above. Patient has had no recurrent stenosis on her carotid endarterectomy site. She has minimal stenosis on the contralateral side. She will continue her aspirin and statin. She will follow-up in one year for repeat carotid duplex.  Brianna Bruns, MD [facility] Office: [phone] Pager: [phone]

## 2014-06-28 ENCOUNTER — Other Ambulatory Visit: Payer: Self-pay | Admitting: *Deleted

## 2014-06-28 DIAGNOSIS — I6523 Occlusion and stenosis of bilateral carotid arteries: Secondary | ICD-10-CM

## 2014-07-23 DIAGNOSIS — Z0289 Encounter for other administrative examinations: Secondary | ICD-10-CM

## 2014-08-12 ENCOUNTER — Other Ambulatory Visit: Payer: Self-pay | Admitting: Neurology

## 2014-08-12 NOTE — Telephone Encounter (Signed)
Patient no showed last appt (Botox)  I spoke with her, said she paid NS fee and would like to reschedule.  Message sent to that dept.

## 2014-08-12 NOTE — Telephone Encounter (Signed)
Brianna [name] patient assigned to Dr Lucia Gaskins

## 2014-08-19 ENCOUNTER — Ambulatory Visit (INDEPENDENT_AMBULATORY_CARE_PROVIDER_SITE_OTHER): Payer: BC Managed Care – PPO | Admitting: Neurology

## 2014-08-19 VITALS — BP 136/78 | HR 81 | Ht 62.0 in | Wt 128.0 lb

## 2014-08-19 DIAGNOSIS — G43719 Chronic migraine without aura, intractable, without status migrainosus: Secondary | ICD-10-CM

## 2014-08-19 DIAGNOSIS — G43709 Chronic migraine without aura, not intractable, without status migrainosus: Secondary | ICD-10-CM

## 2014-08-19 MED ORDER — PHENYTOIN SODIUM EXTENDED 300 MG PO CAPS: 300.0000 mg | ORAL_CAPSULE | Freq: Every day | ORAL | Status: DC

## 2014-08-19 NOTE — Progress Notes (Signed)
[facility]    Provider:  Dr Lucia Gaskins Referring Provider: Darci Needle, MD Primary Care Physician:  Michiel Sites, MD  CC:  Chronic migraine  HPI:  Brianna [name] is a 64 y.o. female here for follow up injections for chronic migraine  Has noted some improvement, notes a decrease in frequency, now occuring 2 times a week. Severity has decreased slightly. Continues to get some benefit with the Imitrex but gets rebound headache the next day. 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   Contraindications and precautions discussed with patient. Aseptic procedure was observed and patient tolerated procedure. Procedure performed by Dr. Elspeth Cho.  The condition has existed for more than 6 months, and pt does not have a diagnosis of ALS, Myasthenia Gravis or Lambert-Eaton Syndrome. Risks and benefits of injections discussed and pt agrees to proceed with the procedure. Written consent obtained These injections are medically necessary. He receives good benefits from these injections. These injections do not cause sedations or hallucinations which the oral therapies may cause.  Indication/Diagnosis:chronic migraine  Type of toxin: N8295    Consent Form Botulism Toxin Injection For Chronic Migraine  Botulism toxin has been approved by the Federal drug administration for treatment of chronic migraine. Botulism toxin does not cure chronic migraine and it may not be effective in some patients.  The administration of botulism toxin is accomplished by injecting a small amount of toxin into the muscles of the neck and head. Dosage must be titrated for each individual. Any benefits resulting from botulism toxin tend to wear off after 3 months with a repeat injection required if benefit is to be maintained. Injections are usually done every 3-4 months with maximum effect peak achieved by about 2 or  3 weeks. Botulism toxin is expensive and you should be sure of what costs you will incur resulting from the injection.  The side effects of botulism toxin use for chronic migraine may include:   -Transient, and usually mild, facial weakness with facial injections  -Transient, and usually mild, head or neck weakness with head/neck injections  -Reduction or loss of forehead facial animation due to forehead muscle weakness  -Eyelid drooping  -Dry eye  -Pain at the site of injection or bruising at the site of injection  -Double vision  -Potential unknown long term risks  Contraindications: You should not have Botox if you are pregnant, nursing, allergic to albumin, have an infection, skin condition, or muscle weakness at the site of the injection, or have myasthenia gravis, Lambert-Eaton syndrome, or ALS.  It is also possible that as with any injection, there may be an allergic reaction or no effect from the medication. Reduced effectiveness after repeated injections is sometimes seen and rarely infection at the injection site may occur. All care will be taken to prevent these side effects. If therapy is given over a long time, atrophy and wasting in the muscle injected may occur. Occasionally the patient's become refractory to treatment because they develop antibodies to the toxin. In this event, therapy needs to be modified.  I have read the above information and consent to the administration of botulism toxin.    ______________  _____   _________________  Patient signature     Date   Witness signature    BOTOX PROCEDURE NOTE FOR MIGRAINE HEADACHE    Contraindications and precautions discussed with patient. Aseptic procedure was observed and patient tolerated procedure. Procedure performed by Dr. Sheralyn Boatman  Lucia Gaskins  The condition has existed for more than 6 months, and pt does not have a diagnosis of ALS, Myasthenia Gravis or Lambert-Eaton Syndrome. Risks and benefits of injections discussed  and pt agrees to proceed with the procedure. Written consent obtained  These injections are medically necessary. He receives good benefits from these injections. These injections do not cause sedations or hallucinations which the oral therapies may cause.  Indication/Diagnosis: chronic migraine  Type of toxin: Botox  Lot # [id number] 100 units x 2 bottles EXP 04/2017 Botox J-Code: N0539  Description of procedure:  The patient was placed in a sitting position. The standard protocol was used for Botox as follows, with 5 units of Botox injected at each site:   -Procerus muscle, midline injection  -Corrugator muscle, bilateral injection  -Frontalis muscle, bilateral injection, with 2 sites each side, medial injection was performed in the upper one third of the frontalis muscle, in the region vertical from the medial inferior edge of the superior orbital rim. The lateral injection was again in the upper one third of the forehead vertically above the lateral limbus of the cornea, 1.5 cm lateral to the medial injection site.  -Temporalis muscle injection, 4 sites, bilaterally. The first injection was 3 cm above the tragus of the ear, second injection site was 1.5 cm to 3 cm up from the first injection site in line with the tragus of the ear. The third injection site was 1.5-3 cm forward between the first 2 injection sites. The fourth injection site was 1.5 cm posterior to the second injection site.  -Occipitalis muscle injection, 3 sites, bilaterally. The first injection was done one half way between the occipital protuberance and the tip of the mastoid process behind the ear. The second injection site was done lateral and superior to the first, 1 fingerbreadth from the first injection. The third injection site was 1 fingerbreadth superiorly and medially from the first injection site.  -Cervical paraspinal muscle injection, 2 sites, bilateral knee first injection site was 1 cm from the midline of  the cervical spine, 3 cm inferior to the lower border of the occipital protuberance. The second injection site was 1.5 cm superiorly and laterally to the first injection site.   -Trapezius muscle injection was performed at 3 sites, bilaterally. The first injection site was in the upper trapezius muscle halfway between the inflection point of the neck, and the acromion. The second injection site was one half way between the acromion and the first injection site. The third injection was done between the first injection site and the inflection point of the neck.   Two 100 unit bottles of Botox was used, 155 units were injected, the rest of the Botox was wasted. The patient tolerated the procedure well, there were no complications of the above procedure.     History   Social History  . Marital Status: Married    Spouse Name: [name]    Number of Children: 2  . Years of Education: Bachelor   Occupational History  . Teacher   . [employer]    Social History Main Topics  . Smoking status: Former Smoker -- 0.50 packs/day for 14 years    Types: Cigarettes    Quit date: 07/19/1988  . Smokeless tobacco: Never Used  . Alcohol Use: No  . Drug Use: No  . Sexual Activity: Not on file   Other Topics Concern  . Not on file   Social History Narrative   Patient is married to (  [name]), has 2 children   Patient is right handed   Education level is Bachelor's degree   Caffeine consumption is 2 cups daily    Family History  Problem Relation Age of Onset  . Heart disease Mother   . Heart disease Father   . Lung disease Mother     ? disease process  . Uterine cancer Mother   . Cervical cancer Maternal Aunt   . Prostate cancer Maternal Grandfather   . Clotting disorder Father   . Rheum arthritis Sister   . Rheum arthritis Maternal Grandmother     Past Medical History  Diagnosis Date  . Hypertension   . Hyperlipidemia   . Migraines   . Epilepsy   . Raynaud's disease   . Rheumatoid  arthritis(714.0)   . Carotid artery occlusion     Past Surgical History  Procedure Laterality Date  . Carotid artery angioplasty  2008    Dr Madilyn Fireman  . Corneal transplant      x 5   . Cholecystectomy  1995  . Corneal transplant Right 01-22-2014    [facility]  . Cataract extraction Right 04-2014    [facility]  . Carotid endarterectomy Left 11-15-2007    Current Outpatient Prescriptions  Medication Sig Dispense Refill  . ALPHAGAN P 0.1 % SOLN Place 1 drop into both eyes.  11  . amLODipine (NORVASC) 5 MG tablet Take 5 mg by mouth daily.      Marland Kitchen atorvastatin (LIPITOR) 40 MG tablet Take 40 mg by mouth daily.      . Calcium Carbonate-Vit D-Min (CALCIUM 1200 PO) Take 2 capsules by mouth daily.      . cefUROXime (CEFTIN) 500 MG tablet Take 500 mg by mouth daily.     . Cholecalciferol (VITAMIN D PO) Take by mouth daily.    . dorzolamide-timolol (COSOPT) 22.3-6.8 MG/ML ophthalmic solution     . ezetimibe (ZETIA) 10 MG tablet Take 10 mg by mouth daily.      . lansoprazole (PREVACID) 15 MG capsule Take 15 mg by mouth daily.      Marland Kitchen leflunomide (ARAVA) 10 MG tablet Take 10 mg by mouth daily.    Marland Kitchen LORazepam (ATIVAN) 0.5 MG tablet Take 0.5 mg by mouth as needed.      . phenytoin (DILANTIN) 300 MG ER capsule Take 1 capsule (300 mg total) by mouth daily. 30 capsule 11  . prednisoLONE acetate (PRED FORTE) 1 % ophthalmic suspension 8 drops daily as directed    . promethazine (PHENERGAN) 25 MG tablet Take 25 mg by mouth every 6 (six) hours as needed.      . sertraline (ZOLOFT) 50 MG tablet Take 50 mg by mouth daily.      . SUMAtriptan (IMITREX) 6 MG/0.5ML SOLN Inject 6 mg into the skin as needed.      . timolol (TIMOPTIC) 0.5 % ophthalmic solution Place 1 drop into both eyes 2 (two) times daily.     Marland Kitchen topiramate (TOPAMAX) 25 MG capsule TAKE 3 CAPSULES BY MOUTH EVERY DAY 30 capsule 0   Current Facility-Administered Medications  Medication Dose Route Frequency Provider Last Rate Last Dose  .  botulinum toxin Type A (BOTOX) injection 200 Units  200 Units Intramuscular Once Omelia Blackwater, DO      . botulinum toxin Type A (BOTOX) injection 200 Units  200 Units Intramuscular Once Omelia Blackwater, DO      . botulinum toxin Type A (BOTOX) injection 200 Units  200 Units  Intramuscular Once Omelia Blackwater, DO      . botulinum toxin Type A (BOTOX) injection 200 Units  200 Units Intramuscular Once Omelia Blackwater, DO      . pneumococcal 23 valent vaccine (PNU-IMMUNE) injection 0.5 mL  0.5 mL Intramuscular Once Nyoka Cowden, MD        Allergies as of 08/19/2014 - Review Complete 08/19/2014  Allergen Reaction Noted  . Morphine and related Shortness Of Breath 01/14/2011  . Sulfa antibiotics Rash 01/14/2011    Vitals: BP 136/78 mmHg  Pulse 81  Ht 5\' 2"  (1.575 m)  Wt 128 lb (58.06 kg)  BMI 23.41 kg/m2 Last Weight:  Wt Readings from Last 1 Encounters:  08/19/14 128 lb (58.06 kg)   Last Height:   Ht Readings from Last 1 Encounters:  08/19/14 5\' 2"  (1.575 m)       Naomie Dean, MD  [facility] [address] [place], [place] [address]  Phone [phone] Fax [phone]

## 2014-08-20 ENCOUNTER — Other Ambulatory Visit: Payer: Self-pay | Admitting: Neurology

## 2014-08-27 ENCOUNTER — Other Ambulatory Visit: Payer: Self-pay | Admitting: Neurology

## 2014-09-10 ENCOUNTER — Telehealth: Payer: Self-pay | Admitting: Neurology

## 2014-09-10 NOTE — Telephone Encounter (Signed)
Patient is calling to get authorization for the Sumatriptan injection for insurance [employer] [place] because the pills make her sick on her stomach. Please call.

## 2014-09-10 NOTE — Telephone Encounter (Signed)
I called back and spoke with the patient.  She said her other provider was writing Imitrex tabs and Injections for her, but she is not able to tolerate the tabs and wants the injections (which are not covered by her ins).  She called her other provider regarding this and they told her they feel it would be best if our office prescribes the injections and also tries to get the insurance to overturn their decision of not covering this medication.  Patient says she has been in touch with [employer], and they are still doing their benefit investigation.  Would you like to start prescribing injections?  Please advise.  Thank you.

## 2014-09-11 MED ORDER — SUMATRIPTAN SUCCINATE 6 MG/0.5ML ~~LOC~~ SOLN: 6.0000 mg | SUBCUTANEOUS | Status: DC | PRN

## 2014-09-11 NOTE — Telephone Encounter (Signed)
Thank you :)

## 2014-09-11 NOTE — Telephone Encounter (Signed)
Sure, no problem.  Rx was already on med list, refill has been ordered.  I called the patient back to advise.  Got no answer.  Left message.

## 2014-09-11 NOTE — Telephone Encounter (Signed)
Brianna [name] - this is fine, can you order for me please? Or want me to order?

## 2014-09-23 DIAGNOSIS — H35352 Cystoid macular degeneration, left eye: Secondary | ICD-10-CM | POA: Insufficient documentation

## 2014-11-13 ENCOUNTER — Other Ambulatory Visit: Payer: Self-pay | Admitting: Internal Medicine

## 2014-11-13 DIAGNOSIS — R0600 Dyspnea, unspecified: Secondary | ICD-10-CM

## 2014-11-21 ENCOUNTER — Ambulatory Visit (INDEPENDENT_AMBULATORY_CARE_PROVIDER_SITE_OTHER): Payer: BC Managed Care – PPO | Admitting: Neurology

## 2014-11-21 ENCOUNTER — Encounter: Payer: Self-pay | Admitting: Neurology

## 2014-11-21 VITALS — BP 162/94 | HR 74 | Temp 97.8°F | Ht 62.0 in | Wt 128.0 lb

## 2014-11-21 DIAGNOSIS — G43719 Chronic migraine without aura, intractable, without status migrainosus: Secondary | ICD-10-CM

## 2014-11-21 NOTE — Progress Notes (Signed)
Provider: Dr Lucia Gaskins Referring Provider: Darci Needle, MD Primary Care Physician: Michiel Sites, MD  CC: Chronic migraine  HPI: Brianna [name] is a 64 y.o. female here for follow up injections for chronic migraine. She has had 10-12 migraines since last botox injections however she has had several eye surgeries and macular degeneration since last visit. The headaches start in the back of the neck. Then unilateral right, light sensitivity, smell trigger, nausea, vomiting. She takes an injection which helps minimize to a few hours or less, otherwise 8-9 hours. Previous to botox therapy she was having 4 migraines weekly, so is a great improvement with the botox.   08/19/2014: Has noted some improvement, notes a decrease in frequency, now occuring 2 times a week. Severity has decreased slightly. Continues to get some benefit with the Imitrex but gets rebound headache the next day. 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   Contraindications and precautions discussed with patient. Aseptic procedure was observed and patient tolerated procedure. Procedure performed by Dr. Artemio Aly  The condition has existed for more than 6 months, and pt does not have a diagnosis of ALS, Myasthenia Gravis or Lambert-Eaton Syndrome. Risks and benefits of injections discussed and pt agrees to proceed with the procedure. Written consent obtained  These injections are medically necessary. He receives good benefits from these injections. These injections do not cause sedations or hallucinations which the oral therapies may cause.  Indication/Diagnosis:chronic migraine  Type of toxin: Y1950 Botox  Lot # [id number] 100 units x 2 bottles  EXP 05/2017  Description of procedure:  The patient was placed in a sitting position. The standard protocol was used for Botox as follows, with 5 units of Botox injected at each site:   -Procerus  muscle, midline injection  -Corrugator muscle, bilateral injection  -Frontalis muscle, bilateral injection, with 2 sites each side, medial injection was performed in the upper one third of the frontalis muscle, in the region vertical from the medial inferior edge of the superior orbital rim. The lateral injection was again in the upper one third of the forehead vertically above the lateral limbus of the cornea, 1.5 cm lateral to the medial injection site.  -Temporalis muscle injection, 4 sites, bilaterally. The first injection was 3 cm above the tragus of the ear, second injection site was 1.5 cm to 3 cm up from the first injection site in line with the tragus of the ear. The third injection site was 1.5-3 cm forward between the first 2 injection sites. The fourth injection site was 1.5 cm posterior to the second injection site.  -Occipitalis muscle injection, 3 sites, bilaterally. The first injection was done one half way between the occipital protuberance and the tip of the mastoid process behind the ear. The second injection site was done lateral and superior to the first, 1 fingerbreadth from the first injection. The third injection site was 1 fingerbreadth superiorly and medially from the first injection site.  -Cervical paraspinal muscle injection, 2 sites, bilateral knee first injection site was 1 cm from the midline of the cervical spine, 3 cm inferior to the lower border of the occipital protuberance. The second injection site was 1.5 cm superiorly and laterally to the first injection site.   -Trapezius muscle injection was performed at 3 sites, bilaterally. The first injection site was in the upper trapezius muscle halfway between the inflection point of the neck, and the acromion. The second injection site was  one half way between the acromion and the first injection site. The third injection was done between the first injection site and the inflection point of the neck.   Two 100 unit  bottles of Botox was used, 155 units were injected, the rest of the Botox was wasted. The patient tolerated the procedure well, there were no complications of the above procedure.

## 2014-11-28 ENCOUNTER — Ambulatory Visit (INDEPENDENT_AMBULATORY_CARE_PROVIDER_SITE_OTHER): Payer: BC Managed Care – PPO | Admitting: Neurology

## 2014-11-28 VITALS — BP 164/82 | HR 68 | Ht 62.0 in | Wt 127.2 lb

## 2014-11-28 DIAGNOSIS — G243 Spasmodic torticollis: Secondary | ICD-10-CM

## 2014-11-28 NOTE — Progress Notes (Signed)
[facility]    Provider:  Dr Lucia Gaskins Referring Provider: Darci Needle, MD Primary Care Physician:  Michiel Sites, MD  CC:  Spasmodic torticollis  HPI:  Brianna [name] is a 64 y.o. female here as a follow up. She has muscle tightness in he left and right cervical muscles. Rigid, hard, painful. The whole muscle gets hard, she feels it in her neck. By the time it is in her neck she has a headache. She loses range of motion in her neck due to the stiffness. She has been to physical therapy to try to help, Tried flexeril and tizanidine which didn't help and made her excessively tired and she could not use it especially with driving or at work.  These symptoms started in her 8s, and it has slowly progressed. She can't even turn her head or lift things. She has had trigger-point injections. She tried exercising. Even in college having problems. She can't sleep because of the cervical muscle tightness. She wakes up in pain. She has tried massage for years which only helped a little bit. She has to keep her arms level or it hurts, even in the car.    Review of Systems: Patient complains of symptoms per HPI as well as the following symptoms: headache and seizure. Pertinent negatives per HPI. All others negative.   History   Social History  . Marital Status: Married    Spouse Name: [name]  . Number of Children: 2  . Years of Education: Bachelor   Occupational History  . Teacher   . [employer]    Social History Main Topics  . Smoking status: Former Smoker -- 0.50 packs/day for 14 years    Types: Cigarettes    Quit date: 07/19/1988  . Smokeless tobacco: Never Used  . Alcohol Use: No  . Drug Use: No  . Sexual Activity: Not on file   Other Topics Concern  . Not on file   Social History Narrative   Patient is married to [name]), has 2 children   Patient is right handed   Education level is Bachelor's degree   Caffeine consumption is 2 cups daily    Family  History  Problem Relation Age of Onset  . Heart disease Mother   . Heart disease Father   . Lung disease Mother     ? disease process  . Uterine cancer Mother   . Cervical cancer Maternal Aunt   . Prostate cancer Maternal Grandfather   . Clotting disorder Father   . Rheum arthritis Sister   . Rheum arthritis Maternal Grandmother     Past Medical History  Diagnosis Date  . Hypertension   . Hyperlipidemia   . Migraines   . Epilepsy   . Raynaud's disease   . Rheumatoid arthritis(714.0)   . Carotid artery occlusion     Past Surgical History  Procedure Laterality Date  . Carotid artery angioplasty  2008    Dr Madilyn Fireman  . Corneal transplant      x 5   . Cholecystectomy  1995  . Corneal transplant Right 01-22-2014    [facility]  . Cataract extraction Right 04-2014    [facility]  . Carotid endarterectomy Left 11-15-2007    Current Outpatient Prescriptions  Medication Sig Dispense Refill  . ALPHAGAN P 0.1 % SOLN Place 1 drop into both eyes.  11  . amLODipine (NORVASC) 5 MG tablet Take 5 mg by mouth daily.      Marland Kitchen atorvastatin (LIPITOR) 40  MG tablet Take 40 mg by mouth daily.      . Calcium Carbonate-Vit D-Min (CALCIUM 1200 PO) Take 2 capsules by mouth daily.      . cefUROXime (CEFTIN) 500 MG tablet Take 500 mg by mouth daily.     . Cholecalciferol (VITAMIN D PO) Take 500 Units by mouth daily.     . dorzolamide-timolol (COSOPT) 22.3-6.8 MG/ML ophthalmic solution     . ezetimibe (ZETIA) 10 MG tablet Take 10 mg by mouth daily.      . lansoprazole (PREVACID) 15 MG capsule Take 15 mg by mouth daily.      Marland Kitchen leflunomide (ARAVA) 10 MG tablet Take 10 mg by mouth daily.    Marland Kitchen LORazepam (ATIVAN) 0.5 MG tablet Take 0.5 mg by mouth as needed.      . phenytoin (DILANTIN) 300 MG ER capsule Take 1 capsule (300 mg total) by mouth daily. 30 capsule 11  . prednisoLONE acetate (PRED FORTE) 1 % ophthalmic suspension 8 drops daily as directed    . promethazine (PHENERGAN) 25 MG tablet Take 25 mg  by mouth every 6 (six) hours as needed.      . sertraline (ZOLOFT) 50 MG tablet Take 50 mg by mouth daily.      . SUMAtriptan (IMITREX) 6 MG/0.5ML SOLN injection Inject 0.5 mLs (6 mg total) into the skin as needed for migraine. 8 vial 3  . timolol (TIMOPTIC) 0.5 % ophthalmic solution Place 1 drop into both eyes 2 (two) times daily.     Marland Kitchen topiramate (TOPAMAX) 25 MG capsule TAKE 3 CAPSULES BY MOUTH EVERY DAY 90 capsule 11  . ZECUITY 6.5 MG/4HR PTCH Place 1 patch onto the skin as needed.     Current Facility-Administered Medications  Medication Dose Route Frequency Provider Last Rate Last Dose  . botulinum toxin Type A (BOTOX) injection 200 Units  200 Units Intramuscular Once Ramond Marrow, DO      . botulinum toxin Type A (BOTOX) injection 200 Units  200 Units Intramuscular Once Ramond Marrow, DO      . botulinum toxin Type A (BOTOX) injection 200 Units  200 Units Intramuscular Once Ramond Marrow, DO      . botulinum toxin Type A (BOTOX) injection 200 Units  200 Units Intramuscular Once Ramond Marrow, DO      . pneumococcal 23 valent vaccine (PNU-IMMUNE) injection 0.5 mL  0.5 mL Intramuscular Once Nyoka Cowden, MD        Allergies as of 11/28/2014 - Review Complete 11/28/2014  Allergen Reaction Noted  . Morphine and related Shortness Of Breath 01/14/2011  . 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 -  refractory to oral medications and PT . - She is a canidadate for btx injections  to reduce excessive tone  and improve ROM  - d/w pt btx benefits as stated above  - d/w pt btx side effects: injection site rxn, reversible weakness - provided literature for pt to read  - reviewed authorization process / SPP / and procedure / expectations -  - will place authorization for Botox 300 units  - Expectated date of injections is in 4 weeks   Naomie Dean, MD  [facility] [address] [place], [place] [address]  Phone [phone] Fax [phone]  A total of 45 minutes was spent face-to-face with this patient. Over half this time was spent on counseling patient on the spasmodic torticollis diagnosis and different diagnostic and therapeutic options available.

## 2014-12-08 ENCOUNTER — Telehealth: Payer: Self-pay | Admitting: Neurology

## 2014-12-08 ENCOUNTER — Encounter: Payer: Self-pay | Admitting: Neurology

## 2014-12-08 NOTE — Telephone Encounter (Signed)
Brianna [name] - can you please start a botox approval form for patient for spasmodic torticollis, thank you. I will call patient when approved.

## 2014-12-09 NOTE — Telephone Encounter (Signed)
Thanks, please give to [place].

## 2014-12-09 NOTE — Telephone Encounter (Signed)
Dr. Lucia Gaskins, Form filled out and ready on desk. Thank you!

## 2014-12-10 NOTE — Telephone Encounter (Signed)
Form given to Brianna [name] to work on. Thank you

## 2015-02-24 ENCOUNTER — Encounter: Payer: Self-pay | Admitting: *Deleted

## 2015-02-24 ENCOUNTER — Ambulatory Visit (INDEPENDENT_AMBULATORY_CARE_PROVIDER_SITE_OTHER): Payer: BC Managed Care – PPO | Admitting: Neurology

## 2015-02-24 ENCOUNTER — Encounter: Payer: Self-pay | Admitting: Neurology

## 2015-02-24 VITALS — BP 144/85 | HR 78 | Temp 97.3°F | Ht 62.0 in | Wt 125.6 lb

## 2015-02-24 DIAGNOSIS — G43719 Chronic migraine without aura, intractable, without status migrainosus: Secondary | ICD-10-CM | POA: Diagnosis not present

## 2015-02-24 DIAGNOSIS — G43709 Chronic migraine without aura, not intractable, without status migrainosus: Secondary | ICD-10-CM

## 2015-02-24 NOTE — Progress Notes (Signed)
History:   Initial history 04/2013 :  Has concerns about migraines. She is not getting any benefit with the Topamax. Has been on this for greater than 6 months. Averaging 4 headaches per week, can last all day. Continues to take Imitrex injections, feels she gets good benefit from injections. Has tried VPA in the past for headaches but could not tolerate. In the past have been on another preventive medication, unsure of name but believes an antidepressant, this medication did not work either. Headaches remain similar in nature and quality. Has no aura but has associated N/V, photo and phonophobia. No visual changes. Has frequent headache spikes up to 8-10/10 pain severity.   Update:She has been going to the gym. Her headaches have improved since starting the botox. She gets maximum 2 headaches a week, 6 a month (from more than 16 a month before starting botox). However the severity of her current migraines is less, not as pounding or throbbing. Still have photophobia, phonophobia and nausea. Occ vomiting.  Pain can still get up to an 8/10 but not as often as before. And she is more responsive to the injectable triptan since starting the botox. Migraines currently last about 30-82minutes. Before the botox they were lasting up to 24 hours and she would have to inject imitrex twice and then come in to get a migraine cocktail. Stress worsens but otherwise unknown triggers. She sees black spots before the headaches sometimes. Not always.   Consent Form Botulism Toxin Injection For Chronic Migraine  Botulism toxin has been approved by the Federal drug administration for treatment of chronic migraine. Botulism toxin does not cure chronic migraine and it may not be effective in some patients.  The administration of botulism toxin is accomplished by injecting a small amount of toxin into the muscles of the neck and head. Dosage must be titrated for each individual. Any benefits resulting from botulism toxin  tend to wear off after 3 months with a repeat injection required if benefit is to be maintained. Injections are usually done every 3-4 months with maximum effect peak achieved by about 2 or 3 weeks. Botulism toxin is expensive and you should be sure of what costs you will incur resulting from the injection.  The side effects of botulism toxin use for chronic migraine may include:   -Transient, and usually mild, facial weakness with facial injections  -Transient, and usually mild, head or neck weakness with head/neck injections  -Reduction or loss of forehead facial animation due to forehead muscle              weakness  -Eyelid drooping  -Dry eye  -Pain at the site of injection or bruising at the site of injection  -Double vision  -Potential unknown long term risks  Contraindications: You should not have Botox if you are pregnant, nursing, allergic to albumin, have an infection, skin condition, or muscle weakness at the site of the injection, or have myasthenia gravis, Lambert-Eaton syndrome, or ALS.  It is also possible that as with any injection, there may be an allergic reaction or no effect from the medication. Reduced effectiveness after repeated injections is sometimes seen and rarely infection at the injection site may occur. All care will be taken to prevent these side effects. If therapy is given over a long time, atrophy and wasting in the muscle injected may occur. Occasionally the patient's become refractory to treatment because they develop antibodies to the toxin. In this event, therapy needs to be modified.  I have read the above information and consent to the administration of botulism toxin.  On file  ______________  _____   _________________  Patient signature     Date   Witness signature       BOTOX PROCEDURE NOTE FOR MIGRAINE HEADACHE    Contraindications and precautions discussed with patient(above). Aseptic procedure was observed and patient tolerated procedure.  Procedure performed by Dr. Artemio Aly  The condition has existed for more than 6 months, and pt does not have a diagnosis of ALS, Myasthenia Gravis or Lambert-Eaton Syndrome. Risks and benefits of injections discussed and pt agrees to proceed with the procedure. Written consent obtained  These injections are medically necessary. She receives good benefits from these injections. These injections do not cause sedations or hallucinations which the oral therapies may cause.  Indication/Diagnosis: chronic migraine BOTOX(J0585) injection was performed according to protocol by [employer]. 200 units(100 unit bottle x 2) of BOTOX was dissolved into 4 cc NS.  NDC: [address]-71  Type of toxin: Botox  Lot # [id number] 100 uniits each x 2  EXP: 09/2017   Description of procedure:  The patient was placed in a sitting position. The standard protocol was used for Botox as follows, with 5 units of Botox injected at each site:   -Procerus muscle, midline injection  -Corrugator muscle, bilateral injection  -Frontalis muscle, bilateral injection, with 2 sites each side, medial injection was performed in the upper one third of the frontalis muscle, in the region vertical from the medial inferior edge of the superior orbital rim. The lateral injection was again in the upper one third of the forehead vertically above the lateral limbus of the cornea, 1.5 cm lateral to the medial injection site.  -Temporalis muscle injection, 4 sites, bilaterally. The first injection was 3 cm above the tragus of the ear, second injection site was 1.5 cm to 3 cm up from the first injection site in line with the tragus of the ear. The third injection site was 1.5-3 cm forward between the first 2 injection sites. The fourth injection site was 1.5 cm posterior to the second injection site.  -Occipitalis muscle injection, 3 sites, bilaterally. The first injection was done one half way between the occipital protuberance and the tip of  the mastoid process behind the ear. The second injection site was done lateral and superior to the first, 1 fingerbreadth from the first injection. The third injection site was 1 fingerbreadth superiorly and medially from the first injection site.  -Cervical paraspinal muscle injection, 2 sites, bilateral knee first injection site was 1 cm from the midline of the cervical spine, 3 cm inferior to the lower border of the occipital protuberance. The second injection site was 1.5 cm superiorly and laterally to the first injection site.  -Trapezius muscle injection was performed at 3 sites, bilaterally. The first injection site was in the upper trapezius muscle halfway between the inflection point of the neck, and the acromion. The second injection site was one half way between the acromion and the first injection site. The third injection was done between the first injection site and the inflection point of the neck.   Will return for repeat injection in 3 months.   200 units of Botox was used, 155 units were injected, the rest of the Botox was wasted. The patient tolerated the procedure well, there were no complications of the above procedure.

## 2015-04-28 DIAGNOSIS — Z9229 Personal history of other drug therapy: Secondary | ICD-10-CM | POA: Insufficient documentation

## 2015-06-16 ENCOUNTER — Telehealth: Payer: Self-pay | Admitting: Neurology

## 2015-06-16 NOTE — Telephone Encounter (Signed)
Called pt back. Headaches start on the left and migrate to the right. Imitrex help but she is having rebound headaches. She is having tingling on left side of neck and weakness on right side. She is not feeling very good. Denies slurred speech/trouble swallowing. Bleeding going on in the back of the eye and her provider put her on anti-inflammatory drops.  Made f/u appt for 06/19/15 at 4pm. Told her I will give Dr Lucia Gaskins the message and we will call her back. She verbalized understanding.

## 2015-06-16 NOTE — Telephone Encounter (Signed)
Patient called requesting appointment this week for excruciating headaches for the past week. Has taken Imitrex, however headache comes right back.

## 2015-06-17 ENCOUNTER — Other Ambulatory Visit: Payer: Self-pay | Admitting: Neurology

## 2015-06-17 DIAGNOSIS — G43001 Migraine without aura, not intractable, with status migrainosus: Secondary | ICD-10-CM

## 2015-06-17 MED ORDER — BUTALBITAL-APAP-CAFFEINE 50-325-40 MG PO TABS: 1.0000 | ORAL_TABLET | Freq: Four times a day (QID) | ORAL | Status: DC | PRN

## 2015-06-17 NOTE — Telephone Encounter (Signed)
Spoke to patient. She was supposed to get botox injections last month and was never scheduled. Can we get ths taken care of asap?She is coming in Thursday and I can do her botox then.  I can even use samples or botox we have if we can replace it with her shipment. thanks

## 2015-06-19 ENCOUNTER — Other Ambulatory Visit: Payer: Self-pay | Admitting: Neurology

## 2015-06-19 ENCOUNTER — Ambulatory Visit (INDEPENDENT_AMBULATORY_CARE_PROVIDER_SITE_OTHER): Payer: BC Managed Care – PPO | Admitting: Neurology

## 2015-06-19 ENCOUNTER — Encounter: Payer: Self-pay | Admitting: Neurology

## 2015-06-19 VITALS — BP 171/97 | HR 82 | Ht 62.0 in | Wt 124.4 lb

## 2015-06-19 DIAGNOSIS — G500 Trigeminal neuralgia: Secondary | ICD-10-CM

## 2015-06-19 DIAGNOSIS — M609 Myositis, unspecified: Secondary | ICD-10-CM | POA: Diagnosis not present

## 2015-06-19 DIAGNOSIS — G43011 Migraine without aura, intractable, with status migrainosus: Secondary | ICD-10-CM

## 2015-06-19 DIAGNOSIS — M7918 Myalgia, other site: Secondary | ICD-10-CM

## 2015-06-19 DIAGNOSIS — IMO0001 Reserved for inherently not codable concepts without codable children: Secondary | ICD-10-CM

## 2015-06-19 DIAGNOSIS — M791 Myalgia: Secondary | ICD-10-CM

## 2015-06-19 DIAGNOSIS — G43009 Migraine without aura, not intractable, without status migrainosus: Secondary | ICD-10-CM

## 2015-06-19 DIAGNOSIS — M5481 Occipital neuralgia: Secondary | ICD-10-CM | POA: Diagnosis not present

## 2015-06-19 MED ORDER — PROMETHAZINE HCL 25 MG/ML IJ SOLN
25.0000 mg | Freq: Once | INTRAMUSCULAR | Status: AC
  Administered 2015-06-19: 25 mg via INTRAMUSCULAR

## 2015-06-19 MED ORDER — METHYLPREDNISOLONE 4 MG PO TBPK: ORAL_TABLET | ORAL | Status: DC

## 2015-06-19 NOTE — Telephone Encounter (Signed)
I will use one of ours today and we can replace it on 12/6 thanks!

## 2015-06-19 NOTE — Telephone Encounter (Signed)
Patients authorizations are valid through 11/17/15. Medication will be delivered 06/24/15.

## 2015-06-20 ENCOUNTER — Ambulatory Visit (INDEPENDENT_AMBULATORY_CARE_PROVIDER_SITE_OTHER): Payer: BC Managed Care – PPO | Admitting: Neurology

## 2015-06-20 ENCOUNTER — Encounter: Payer: Self-pay | Admitting: Neurology

## 2015-06-20 VITALS — BP 160/88 | HR 89 | Temp 97.1°F | Ht 62.0 in | Wt 124.0 lb

## 2015-06-20 DIAGNOSIS — G43719 Chronic migraine without aura, intractable, without status migrainosus: Secondary | ICD-10-CM

## 2015-06-20 DIAGNOSIS — G43709 Chronic migraine without aura, not intractable, without status migrainosus: Secondary | ICD-10-CM

## 2015-06-20 NOTE — Progress Notes (Signed)
Botox- 200 unit total (1 vial) Lot: [id number] Expiration: Janaury 2019 [id number]  0.9% Sodium Chloride- 50mL used total  Lot: [id number] Expiration: 04/2016 NDC: 43154-008-67

## 2015-06-22 NOTE — Progress Notes (Signed)
    NERVE BLOCK PROCEDURE NOTE  History:   Procedure: Patient was consented for bilateral occipital and bilateral trigeminal nerve blocks and cervical trigger point injections. A solution containing 0.5% 5mg /ml Bupivacaine 10-cc, Lidocaine 2%  10cc and 80mg  Depo Medrol 2cc was prepared in 3-CC syringes with 27 gauge 1/2 inch needle.   12 total Target areas in the suboccipital and temporal regions and 10 total target areas in the cervical muscles (trapezius, semispinalis and splenius capitus, levator scapulae, cervical paraspinals bilaterally)  bilaterally were identified via palpation, pain response and anatomic location of the trigeminal nerve branches and the greater occipital nerve.The sites junctions were sterilized with alcohol wipes. The sites were sterilized with alcohol wipes. 13ml was injected at each site. The contents of each syringe was injected in a fanlike fashion. The headache and neck pain improved from 10/10 to 4/10. Patient tolerated the procedure well and no complications were noted.    Consent was provided below and patient acknowledged understanding:   Depo-Medrol 80mg /ml [facility] 0m  Expiration date: 07/2015 Lot number: [facility]  Bupivicaine 50mg /39ml NDC 08/2015  Expiration date: 05/2016 Lot number:  Lidocaine 2% NDC 9m  Expiration date: 11/19 Lot number: 06/2016   What to expect afterwards?  Immediately after the injection, the back of your head may feel warm and numb. You may also experience reduction in the pain. The local anaesthetic wears off in a few hours and the steroid usually takes  3-7 days to take effect.   The pain relief is vary variable and can last from a few days to several months. Some patients do not experience any pain relief. Hence it is difficult to predict the outcome of the injection treatment in a particular patient.   There may be some discomfort at the injection site for a couple of days after treatment,  however, this should settle quite quickly. We advise you to take things easy for the rest of the day. Continue taking your pain medication as advised by your consultant or until you feel benefit from the treatment.   What are the side effects / complications?  Common   Soreness / bruising at the injection site.   Temporary increase (up to 7 days) in pain following procedure.   Rare   Bleeding   Infection at the injection site   Allergic reaction   New pain   Worsening pain

## 2015-06-22 NOTE — Progress Notes (Signed)
Consent Form Botulism Toxin Injection For Chronic Migraine  Botulism toxin has been approved by the Federal drug administration for treatment of chronic migraine. Botulism toxin does not cure chronic migraine and it may not be effective in some patients.  The administration of botulism toxin is accomplished by injecting a small amount of toxin into the muscles of the neck and head. Dosage must be titrated for each individual. Any benefits resulting from botulism toxin tend to wear off after 3 months with a repeat injection required if benefit is to be maintained. Injections are usually done every 3-4 months with maximum effect peak achieved by about 2 or 3 weeks. Botulism toxin is expensive and you should be sure of what costs you will incur resulting from the injection.  The side effects of botulism toxin use for chronic migraine may include:   -Transient, and usually mild, facial weakness with facial injections  -Transient, and usually mild, head or neck weakness with head/neck injections  -Reduction or loss of forehead facial animation due to forehead muscle              weakness  -Eyelid drooping  -Dry eye  -Pain at the site of injection or bruising at the site of injection  -Double vision  -Potential unknown long term risks  Contraindications: You should not have Botox if you are pregnant, nursing, allergic to albumin, have an infection, skin condition, or muscle weakness at the site of the injection, or have myasthenia gravis, Lambert-Eaton syndrome, or ALS.  It is also possible that as with any injection, there may be an allergic reaction or no effect from the medication. Reduced effectiveness after repeated injections is sometimes seen and rarely infection at the injection site may occur. All care will be taken to prevent these side effects. If therapy is given over a long time, atrophy and wasting in the muscle injected may occur. Occasionally the patient's become refractory to  treatment because they develop antibodies to the toxin. In this event, therapy needs to be modified.  I have read the above information and consent to the administration of botulism toxin.  On file  ______________  _____   _________________  Patient signature     Date   Witness signature       BOTOX PROCEDURE NOTE FOR MIGRAINE HEADACHE    Contraindications and precautions discussed with patient(above). Aseptic procedure was observed and patient tolerated procedure. Procedure performed by Dr. Artemio Aly  The condition has existed for more than 6 months, and pt does not have a diagnosis of ALS, Myasthenia Gravis or Lambert-Eaton Syndrome. Risks and benefits of injections discussed and pt agrees to proceed with the procedure. Written consent obtained  These injections are medically necessary. He receives good benefits from these injections. These injections do not cause sedations or hallucinations which the oral therapies may cause.  Indication/Diagnosis: chronic migraine BOTOX(J0585) injection was performed according to protocol by [employer]. 200 units of BOTOX was dissolved into 4 cc NS.  NDC: [address]-78  Type of toxin: Botox Botox- 200 unit total (1 vial)  Lot: [id number]  Expiration: Janaury 2019  [id number]  0.9% Sodium Chloride- 33mL used total  Lot: [id number]  Expiration: 04/2016  NDC: 69629-528-41    Description of procedure:  The patient was placed in a sitting position. The standard protocol was used for Botox as follows, with 5 units of Botox injected at each site:   -Procerus muscle, midline injection  -Corrugator muscle, bilateral injection  -Frontalis muscle, bilateral injection,  with 2 sites each side, medial injection was performed in the upper one third of the frontalis muscle, in the region vertical from the medial inferior edge of the superior orbital rim. The lateral injection was again in the upper one third of the forehead vertically above the lateral  limbus of the cornea, 1.5 cm lateral to the medial injection site.  -Temporalis muscle injection, 4 sites, bilaterally. The first injection was 3 cm above the tragus of the ear, second injection site was 1.5 cm to 3 cm up from the first injection site in line with the tragus of the ear. The third injection site was 1.5-3 cm forward between the first 2 injection sites. The fourth injection site was 1.5 cm posterior to the second injection site.  -Occipitalis muscle injection, 3 sites, bilaterally. The first injection was done one half way between the occipital protuberance and the tip of the mastoid process behind the ear. The second injection site was done lateral and superior to the first, 1 fingerbreadth from the first injection. The third injection site was 1 fingerbreadth superiorly and medially from the first injection site.  -Cervical paraspinal muscle injection, 2 sites, bilateral knee first injection site was 1 cm from the midline of the cervical spine, 3 cm inferior to the lower border of the occipital protuberance. The second injection site was 1.5 cm superiorly and laterally to the first injection site.  -Trapezius muscle injection was performed at 3 sites, bilaterally. The first injection site was in the upper trapezius muscle halfway between the inflection point of the neck, and the acromion. The second injection site was one half way between the acromion and the first injection site. The third injection was done between the first injection site and the inflection point of the neck.   Will return for repeat injection in 3 months.   A 200 unit sof Botox was used, 155 units were injected, the rest of the Botox was wasted. The patient tolerated the procedure well, there were no complications of the above procedure.

## 2015-06-27 ENCOUNTER — Encounter: Payer: Self-pay | Admitting: Vascular Surgery

## 2015-07-03 ENCOUNTER — Ambulatory Visit (INDEPENDENT_AMBULATORY_CARE_PROVIDER_SITE_OTHER): Payer: BC Managed Care – PPO | Admitting: Family

## 2015-07-03 ENCOUNTER — Encounter: Payer: Self-pay | Admitting: Family

## 2015-07-03 ENCOUNTER — Ambulatory Visit (HOSPITAL_COMMUNITY)
Admission: RE | Admit: 2015-07-03 | Discharge: 2015-07-03 | Disposition: A | Discharge status: Home / Self Care | Payer: BC Managed Care – PPO | Source: Ambulatory Visit | Attending: Family | Admitting: Family

## 2015-07-03 VITALS — BP 140/89 | HR 76 | Temp 97.9°F | Resp 16 | Ht 62.0 in | Wt 123.0 lb

## 2015-07-03 DIAGNOSIS — I6523 Occlusion and stenosis of bilateral carotid arteries: Secondary | ICD-10-CM

## 2015-07-03 DIAGNOSIS — I6521 Occlusion and stenosis of right carotid artery: Secondary | ICD-10-CM

## 2015-07-03 DIAGNOSIS — Z9889 Other specified postprocedural states: Secondary | ICD-10-CM | POA: Diagnosis not present

## 2015-07-03 DIAGNOSIS — Z48812 Encounter for surgical aftercare following surgery on the circulatory system: Secondary | ICD-10-CM | POA: Diagnosis not present

## 2015-07-03 NOTE — Patient Instructions (Signed)
Stroke Prevention Some medical conditions and behaviors are associated with an increased chance of having a stroke. You may prevent a stroke by making healthy choices and managing medical conditions. HOW CAN I REDUCE MY RISK OF HAVING A STROKE?   Stay physically active. Get at least 30 minutes of activity on most or all days.  Do not smoke. It may also be helpful to avoid exposure to secondhand smoke.  Limit alcohol use. Moderate alcohol use is considered to be:  No more than 2 drinks per day for men.  No more than 1 drink per day for nonpregnant women.  Eat healthy foods. This involves:  Eating 5 or more servings of fruits and vegetables a day.  Making dietary changes that address high blood pressure (hypertension), high cholesterol, diabetes, or obesity.  Manage your cholesterol levels.  Making food choices that are high in fiber and low in saturated fat, trans fat, and cholesterol may control cholesterol levels.  Take any prescribed medicines to control cholesterol as directed by your health care provider.  Manage your diabetes.  Controlling your carbohydrate and sugar intake is recommended to manage diabetes.  Take any prescribed medicines to control diabetes as directed by your health care provider.  Control your hypertension.  Making food choices that are low in salt (sodium), saturated fat, trans fat, and cholesterol is recommended to manage hypertension.  Ask your health care provider if you need treatment to lower your blood pressure. Take any prescribed medicines to control hypertension as directed by your health care provider.  If you are 18-39 years of age, have your blood pressure checked every 3-5 years. If you are 40 years of age or older, have your blood pressure checked every year.  Maintain a healthy weight.  Reducing calorie intake and making food choices that are low in sodium, saturated fat, trans fat, and cholesterol are recommended to manage  weight.  Stop drug abuse.  Avoid taking birth control pills.  Talk to your health care provider about the risks of taking birth control pills if you are over 35 years old, smoke, get migraines, or have ever had a blood clot.  Get evaluated for sleep disorders (sleep apnea).  Talk to your health care provider about getting a sleep evaluation if you snore a lot or have excessive sleepiness.  Take medicines only as directed by your health care provider.  For some people, aspirin or blood thinners (anticoagulants) are helpful in reducing the risk of forming abnormal blood clots that can lead to stroke. If you have the irregular heart rhythm of atrial fibrillation, you should be on a blood thinner unless there is a good reason you cannot take them.  Understand all your medicine instructions.  Make sure that other conditions (such as anemia or atherosclerosis) are addressed. SEEK IMMEDIATE MEDICAL CARE IF:   You have sudden weakness or numbness of the face, arm, or leg, especially on one side of the body.  Your face or eyelid droops to one side.  You have sudden confusion.  You have trouble speaking (aphasia) or understanding.  You have sudden trouble seeing in one or both eyes.  You have sudden trouble walking.  You have dizziness.  You have a loss of balance or coordination.  You have a sudden, severe headache with no known cause.  You have new chest pain or an irregular heartbeat. Any of these symptoms may represent a serious problem that is an emergency. Do not wait to see if the symptoms will   go away. Get medical help at once. Call your local emergency services (911 in [place] Do not drive yourself to the hospital.   This information is not intended to replace advice given to you by your health care provider. Make sure you discuss any questions you have with your health care provider.   Document Released: 08/12/2004 Document Revised: 07/26/2014 Document Reviewed:  01/05/2013 Elsevier Interactive Patient Education 2016 Elsevier Inc.  

## 2015-07-03 NOTE — Progress Notes (Signed)
Chief Complaint: Extracranial Carotid Artery Stenosis   History of Present Illness  Brianna [name] is a 64 y.o. female patient of Dr. Darrick Penna who is s/p left carotid endarterectomy on 11/15/2007.  She reports 2 TIA's; the second was about 2006 as manifested by expressive aphasia, right hemiparesis, severe headache, and transient left monocular loss of vision. She was told that brain imaging found evidence of a previous TIA but she does not recall any sx's of this. She was evaluated by Dr. Sandria Manly at that time, neurologist.   She still has slight right leg and arm weakness when she gets overly tired.  Pt states her left pupil remains dilated since she had several corneal surgeries.  She denies claudication sx's with walking, denies any hx of cardiac problems.  She is receiving botox injections for headaches and states that bilateral arm weakness which she has is a side effect from this.   The patient denies New Medical or Surgical History other than more migraines that are managed by Dr. Lucia Gaskins.   Pt Diabetic: no Pt smoker: former smoker, quit at age 64, started at age 64  Pt meds include: Statin : yes ASA: yes Other anticoagulants/antiplatelets: no   Past Medical History  Diagnosis Date  . Hypertension   . Hyperlipidemia   . Migraines   . Epilepsy (HCC)   . Raynaud's disease   . Rheumatoid arthritis(714.0)   . Carotid artery occlusion   . Glaucoma     Social History Social History  Substance Use Topics  . Smoking status: Former Smoker -- 0.50 packs/day for 14 years    Types: Cigarettes    Quit date: 07/19/1988  . Smokeless tobacco: Never Used  . Alcohol Use: No    Family History Family History  Problem Relation Age of Onset  . Heart disease Mother   . Heart disease Father   . Lung disease Mother     ? disease process  . Uterine cancer Mother   . Cervical cancer Maternal Aunt   . Prostate cancer Maternal Grandfather   . Clotting disorder Father   . Rheum  arthritis Sister   . Rheum arthritis Maternal Grandmother     Surgical History Past Surgical History  Procedure Laterality Date  . Carotid artery angioplasty  2008    Dr Madilyn Fireman  . Corneal transplant      x 5   . Cholecystectomy  1995  . 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 Cataract extraction Right 04-2014    [facility]  . Carotid endarterectomy Left 11-15-2007  . Eye surgery      Allergies  Allergen Reactions  . Morphine Other (See Comments)    Difficulty breathing  . Morphine And Related Shortness Of Breath  . Codeine Nausea And Vomiting  . Hydrocodone-Acetaminophen Nausea And Vomiting    But tolerates tylenol  . Sulfa Antibiotics Rash    "Large bumps"  . Sulfamethoxazole Rash    Current Outpatient Prescriptions  Medication Sig Dispense Refill  . ALPHAGAN P 0.1 % SOLN Place 1 drop into both eyes.  11  . amLODipine (NORVASC) 5 MG tablet Take 5 mg by mouth daily.      Marland Kitchen atorvastatin (LIPITOR) 40 MG tablet Take 40 mg by mouth daily.      . butalbital-acetaminophen-caffeine (FIORICET, ESGIC) 50-325-40 MG tablet Take 1 tablet by mouth every 6 (six) hours as needed for headache. 10 tablet 3  . Calcium Carbonate-Vit D-Min (CALCIUM 1200 PO) Take 2  capsules by mouth daily.      . cefUROXime (CEFTIN) 500 MG tablet Take 500 mg by mouth daily.     . Cholecalciferol (VITAMIN D PO) Take 500 Units by mouth daily.     . dorzolamide-timolol (COSOPT) 22.3-6.8 MG/ML ophthalmic solution     . ezetimibe (ZETIA) 10 MG tablet Take 10 mg by mouth daily.      . lansoprazole (PREVACID) 15 MG capsule Take 15 mg by mouth daily.      Marland Kitchen leflunomide (ARAVA) 10 MG tablet Take 10 mg by mouth daily.    Marland Kitchen LORazepam (ATIVAN) 0.5 MG tablet Take 0.5 mg by mouth as needed.      . methylPREDNISolone (MEDROL DOSEPAK) 4 MG TBPK tablet follow package directions 21 tablet 0  . phenytoin (DILANTIN) 300 MG ER capsule Take 1 capsule (300 mg total) by mouth daily. 30 capsule 11  . prednisoLONE  acetate (PRED FORTE) 1 % ophthalmic suspension 8 drops daily as directed    . promethazine (PHENERGAN) 25 MG tablet Take 25 mg by mouth every 6 (six) hours as needed.      . sertraline (ZOLOFT) 50 MG tablet Take 50 mg by mouth daily.      . SUMAtriptan (IMITREX) 6 MG/0.5ML SOLN injection Inject 0.5 mLs (6 mg total) into the skin as needed for migraine. 8 vial 3  . timolol (TIMOPTIC) 0.5 % ophthalmic solution Place 1 drop into both eyes 2 (two) times daily.     Marland Kitchen topiramate (TOPAMAX) 25 MG capsule TAKE 3 CAPSULES BY MOUTH EVERY DAY 90 capsule 11  . ZECUITY 6.5 MG/4HR PTCH Place 1 patch onto the skin as needed.     Current Facility-Administered Medications  Medication Dose Route Frequency Provider Last Rate Last Dose  . botulinum toxin Type A (BOTOX) injection 200 Units  200 Units Intramuscular Once Ramond Marrow, DO      . botulinum toxin Type A (BOTOX) injection 200 Units  200 Units Intramuscular Once Ramond Marrow, DO      . botulinum toxin Type A (BOTOX) injection 200 Units  200 Units Intramuscular Once Ramond Marrow, DO      . botulinum toxin Type A (BOTOX) injection 200 Units  200 Units Intramuscular Once Ramond Marrow, DO      . pneumococcal 23 valent vaccine (PNU-IMMUNE) injection 0.5 mL  0.5 mL Intramuscular Once Nyoka Cowden, MD        Review of Systems : See HPI for pertinent positives and negatives.  Physical Examination  Filed Vitals:   07/03/15 0958 07/03/15 1001  BP: 166/94 141/87  Pulse: 78 77  Temp:  97.9 F (36.6 C)  TempSrc:  Oral  Resp:  16  Height:  5\' 2"  (1.575 m)  Weight:  123 lb (55.792 kg)  SpO2:  100%   Body mass index is 22.49 kg/(m^2).  General: WDWN female in NAD GAIT: normal Eyes: PERRLA Pulmonary:  Non-labored, CTAB, no rales,  rhonchi,  or wheezing.  Cardiac: regular rhythm,  no detected murmur.  VASCULAR EXAM Carotid Bruits Right Left   Negative Negative    Aorta is not palpable. Radial pulses are 2+ palpable and equal.  LE Pulses Right Left       POPLITEAL  not palpable   not palpable       POSTERIOR TIBIAL   palpable    palpable        DORSALIS PEDIS      ANTERIOR TIBIAL  palpable  palpable     Gastrointestinal: soft, nontender, BS WNL, no r/g, no palpable masses.  Musculoskeletal: No muscle atrophy/wasting. M/S 5/5 throughout, extremities without ischemic changes.  Neurologic: A&O X 3; Appropriate Affect, Speech is normal, CN 2-12 intact except left pupil is dilated, larger than right pupil, pain and light touch intact in extremities, Motor exam as listed above.   Non-Invasive Vascular Imaging CAROTID DUPLEX 07/03/2015   Right ICA: 1-39% stenosis. Vessel tortuosity in the distal ICA Left ICA: patent CEA site with no restenosis. No significant change compared to 06/27/14 exam.   Assessment: Brianna [name] is a 64 y.o. female who is s/p left carotid endarterectomy on 11/15/2007.  She reports 2 TIA's; the second was about 2006 as manifested by expressive aphasia, right hemiparesis, severe headache, and transient left monocular loss of vision. She was told that brain imaging found evidence of a previous TIA but she does not recall any sx's of this. She was evaluated by Dr. Sandria Manly at that time, neurologist.   She still has slight right leg and arm weakness when she gets overly tired.   Today's carotid duplex suggests minimal right ICA stenosis with vessel tortuosity in the distal ICA. Patent left CEA site with no restenosis. No significant change compared to 06/27/14 exam.     Plan: Follow-up in 1 year with Carotid Duplex scan.   I discussed in depth with the patient the nature of atherosclerosis, and emphasized the importance of maximal medical management including strict control of blood pressure, blood glucose, and lipid levels, obtaining regular exercise, and continued cessation of  smoking.  The patient is aware that without maximal medical management the underlying atherosclerotic disease process will progress, limiting the benefit of any interventions. The patient was given information about stroke prevention and what symptoms should prompt the patient to seek immediate medical care. Thank you for allowing [place] to participate in this patient's care.  Charisse March, RN, MSN, FNP-C [facility] Office: [phone]  Clinic Physician: [facility]  07/03/2015 10:08 AM

## 2015-07-03 NOTE — Progress Notes (Signed)
Filed Vitals:   07/03/15 0958 07/03/15 1001 07/03/15 1007 07/03/15 1009  BP: 166/94 141/87 136/84 140/89  Pulse: 78 77 74 76  Temp:  97.9 F (36.6 C)    TempSrc:  Oral    Resp:  16    Height:  5\' 2"  (1.575 m)    Weight:  123 lb (55.792 kg)    SpO2:  100%

## 2015-07-09 ENCOUNTER — Telehealth: Payer: Self-pay | Admitting: Neurology

## 2015-07-09 NOTE — Telephone Encounter (Signed)
Called pt back. She took 3 shots last week and 2 this week so far. She knows not to take more than 2 in 24 hours. She is not ready for refill until January 1st. No more refills on last prescription. She is has two pens left. She is available all day tomorrow. Advised I will speak to Dr Lucia Gaskins and call her back tomorrow. She verbalized understanding.

## 2015-07-09 NOTE — Telephone Encounter (Signed)
Pt called and says she is still have migraines 2-3 times a week. Not as intense. She is running out of Imitrex before its time to refill. She also needs a refill SUMAtriptan (IMITREX) 6 MG/0.5ML SOLN injection. Please call and advise [phone]

## 2015-07-10 MED ORDER — SUMATRIPTAN SUCCINATE 6 MG/0.5ML ~~LOC~~ SOLN: 6.0000 mg | SUBCUTANEOUS | Status: DC | PRN

## 2015-07-10 NOTE — Telephone Encounter (Signed)
Sent refill to pt pharmacy per Dr Lucia Gaskins request

## 2015-07-10 NOTE — Telephone Encounter (Signed)
Called pt and advised ok to refill med per Dr Lucia Gaskins and rx sent to pharmacy. She verbalized understanding. Dr Lucia Gaskins would like her to give botox a little more time to work.

## 2015-07-10 NOTE — Telephone Encounter (Signed)
Per Dr Lucia Gaskins- okay to refill medication.

## 2015-07-27 ENCOUNTER — Other Ambulatory Visit: Payer: Self-pay | Admitting: Neurology

## 2015-07-30 ENCOUNTER — Telehealth: Payer: Self-pay | Admitting: Neurology

## 2015-07-30 NOTE — Telephone Encounter (Signed)
Pt called to r/s botox appt. Please call

## 2015-07-31 NOTE — Telephone Encounter (Signed)
Spoke with patient and rescheduled apt

## 2015-08-15 ENCOUNTER — Other Ambulatory Visit: Payer: Self-pay | Admitting: Neurology

## 2015-09-09 ENCOUNTER — Telehealth: Payer: Self-pay | Admitting: Neurology

## 2015-09-09 NOTE — Telephone Encounter (Signed)
Jenna with [facility] [phone] called to set up botox delivery.

## 2015-09-11 ENCOUNTER — Other Ambulatory Visit: Payer: Self-pay | Admitting: Neurology

## 2015-09-15 ENCOUNTER — Other Ambulatory Visit: Payer: Self-pay | Admitting: Neurology

## 2015-09-16 ENCOUNTER — Telehealth: Payer: Self-pay | Admitting: Neurology

## 2015-09-16 NOTE — Telephone Encounter (Signed)
Patient called to advise [facility] in [facility] on [address] is saying that patient has no more refills on topiramate (TOPAMAX) 25 MG capsule, states she has been out of this medication since yesterday.

## 2015-09-16 NOTE — Telephone Encounter (Signed)
Called pharmacy. On hold for 20 min. Spoke to [employer]. She took verbal to refill. I advised refill sent back on 2.1.17 with 3 refills but she stated they never received it. I advised it stated that receipt was confirmed by pharmacy. She was not she what happened and apologized. She took verbal.   Called pt and apologized. Explained situation. Pt understands and appreciation.

## 2015-09-18 ENCOUNTER — Ambulatory Visit (INDEPENDENT_AMBULATORY_CARE_PROVIDER_SITE_OTHER): Payer: BC Managed Care – PPO | Admitting: Neurology

## 2015-09-18 VITALS — BP 147/87 | HR 73 | Ht 62.0 in | Wt 124.0 lb

## 2015-09-18 DIAGNOSIS — G43709 Chronic migraine without aura, not intractable, without status migrainosus: Secondary | ICD-10-CM

## 2015-09-18 NOTE — Progress Notes (Signed)

## 2015-09-18 NOTE — Progress Notes (Signed)
Botox-100unitsx2 vials Lot: [id number] Expiration: 03/2018 [id number]  0.9% Sodium Chloride- 72mL total [id number] Expiration: 04/2016 NDC: 76546-503-54

## 2015-09-24 ENCOUNTER — Telehealth: Payer: Self-pay | Admitting: Neurology

## 2015-09-24 NOTE — Telephone Encounter (Signed)
Called patient to schedule next injection, spoke with her and she requested to call back this afternoon to schedule.

## 2015-09-27 ENCOUNTER — Other Ambulatory Visit: Payer: Self-pay | Admitting: Neurology

## 2015-09-29 ENCOUNTER — Ambulatory Visit: Payer: BC Managed Care – PPO | Admitting: Neurology

## 2015-09-29 ENCOUNTER — Telehealth: Payer: Self-pay | Admitting: Neurology

## 2015-09-29 DIAGNOSIS — G43001 Migraine without aura, not intractable, with status migrainosus: Secondary | ICD-10-CM

## 2015-09-29 MED ORDER — BUTALBITAL-APAP-CAFFEINE 50-325-40 MG PO TABS: 1.0000 | ORAL_TABLET | Freq: Four times a day (QID) | ORAL | Status: DC | PRN

## 2015-09-29 NOTE — Telephone Encounter (Signed)
Patient is calling to order a Rx butalbital-acetaminophen-caffeine 50-325-40 mg to be sent to [facility].  Thanks!

## 2015-09-29 NOTE — Telephone Encounter (Signed)
Dr Lucia Gaskins- is this ok?

## 2015-09-29 NOTE — Telephone Encounter (Signed)
Yes thanks 

## 2015-09-29 NOTE — Telephone Encounter (Signed)
Sent in rx refill per pt request.

## 2015-10-06 ENCOUNTER — Ambulatory Visit: Payer: BC Managed Care – PPO | Admitting: Neurology

## 2015-10-21 DIAGNOSIS — H04123 Dry eye syndrome of bilateral lacrimal glands: Secondary | ICD-10-CM | POA: Insufficient documentation

## 2015-10-22 DIAGNOSIS — H0102A Squamous blepharitis right eye, upper and lower eyelids: Secondary | ICD-10-CM | POA: Insufficient documentation

## 2015-12-10 ENCOUNTER — Telehealth: Payer: Self-pay | Admitting: Neurology

## 2015-12-10 NOTE — Telephone Encounter (Signed)
Called the pharmacy to schedule delivery of the patients Botox medication. They stated that they are waiting on patient consent and have been unable to reach her.

## 2015-12-10 NOTE — Telephone Encounter (Signed)
Called the patient and left her a VM with the information of the pharmacy and their phone number.

## 2015-12-18 ENCOUNTER — Telehealth: Payer: Self-pay | Admitting: Neurology

## 2015-12-18 NOTE — Telephone Encounter (Signed)
Phone staff, please refer to previous phone notes regarding botox. Kara Mead, I will call the patient and ask her what the issue was when calling in to the pharmacy to give consent.

## 2015-12-18 NOTE — Telephone Encounter (Signed)
Danielle- can you check in this for me? Pt has appt for botox next week.

## 2015-12-18 NOTE — Telephone Encounter (Signed)
Pt called said [facility] does not have RX for botox. Pt's appt is 12/25/15. Please send RX and call pt to confirm.

## 2015-12-18 NOTE — Telephone Encounter (Signed)
Called pharmacy to schedule delivery of patients medication. They stated that they have not been able to reach the patient or heard from her since we last talked. He tried to reach her while I held on the phone with no luck. I called the patient and left a VM asking her to call the pharmacy and leaving their number for her.

## 2015-12-22 ENCOUNTER — Telehealth: Payer: Self-pay | Admitting: Neurology

## 2015-12-22 NOTE — Telephone Encounter (Signed)
Call transferred to [facility] from [place].

## 2015-12-25 ENCOUNTER — Ambulatory Visit (INDEPENDENT_AMBULATORY_CARE_PROVIDER_SITE_OTHER): Payer: BC Managed Care – PPO | Admitting: Neurology

## 2015-12-25 DIAGNOSIS — G43719 Chronic migraine without aura, intractable, without status migrainosus: Secondary | ICD-10-CM

## 2015-12-25 MED ORDER — TOPIRAMATE 100 MG PO TABS: 100.0000 mg | ORAL_TABLET | Freq: Every day | ORAL | Status: DC

## 2015-12-25 MED ORDER — ZOLMITRIPTAN 5 MG NA SOLN: 1.0000 | NASAL | Status: DC | PRN

## 2015-12-25 NOTE — Progress Notes (Signed)
Consent Form Botulism Toxin Injection For Chronic Migraine  Botulism toxin has been approved by the Federal drug administration for treatment of chronic migraine. Botulism toxin does not cure chronic migraine and it may not be effective in some patients. Discussed before every appointment, patient and knowledge and understanding  The administration of botulism toxin is accomplished by injecting a small amount of toxin into the muscles of the neck and head. Dosage must be titrated for each individual. Any benefits resulting from botulism toxin tend to wear off after 3 months with a repeat injection required if benefit is to be maintained. Injections are usually done every 3-4 months with maximum effect peak achieved by about 2 or 3 weeks. Botulism toxin is expensive and you should be sure of what costs you will incur resulting from the injection.  The side effects of botulism toxin use for chronic migraine may include:   -Transient, and usually mild, facial weakness with facial injections  -Transient, and usually mild, head or neck weakness with head/neck injections  -Reduction or loss of forehead facial animation due to forehead muscle              weakness  -Eyelid drooping  -Dry eye  -Pain at the site of injection or bruising at the site of injection  -Double vision  -Potential unknown long term risks  Contraindications: You should not have Botox if you are pregnant, nursing, allergic to albumin, have an infection, skin condition, or muscle weakness at the site of the injection, or have myasthenia gravis, Lambert-Eaton syndrome, or ALS.  It is also possible that as with any injection, there may be an allergic reaction or no effect from the medication. Reduced effectiveness after repeated injections is sometimes seen and rarely infection at the injection site may occur. All care will be taken to prevent these side effects. If therapy is given over a long time, atrophy and wasting in the  muscle injected may occur. Occasionally the patient's become refractory to treatment because they develop antibodies to the toxin. In this event, therapy needs to be modified.  Patient acknowledged understanding before injections.    BOTOX PROCEDURE NOTE FOR MIGRAINE HEADACHE    Contraindications and precautions discussed with patient(above). Aseptic procedure was observed and patient tolerated procedure. Procedure performed by Dr. Artemio Aly  The condition has existed for more than 6 months, and pt does not have a diagnosis of ALS, Myasthenia Gravis or Lambert-Eaton Syndrome. Risks and benefits of injections discussed and pt agrees to proceed with the procedure. Written consent obtained  These injections are medically necessary. She receives good benefits from these injections. These injections do not cause sedations or hallucinations which the oral therapies may cause.  Indication/Diagnosis: chronic migraine BOTOX(J0585) injection was performed according to protocol by [employer]. 200 units of BOTOX was dissolved into 4 cc NS.  NDC: [address]-25  Type of toxin: Botox  Lot # [id number] x 2 bottles EXP: 05/2019   Description of procedure:  The patient was placed in a sitting position. The standard protocol was used for Botox as follows, with 5 units of Botox injected at each site unless otherwise specified:   -Procerus muscle, midline injection  -Corrugator muscle, bilateral injection  -Frontalis muscle, bilateral injection, with 2 sites each side, medial injection was performed in the upper one third of the frontalis muscle, in the region vertical from the medial inferior edge of the superior orbital rim. The lateral injection was again in the upper one third of the forehead  vertically above the lateral limbus of the cornea, 1.5 cm lateral to the medial injection site.  -Temporalis muscle injection, 4 sites, bilaterally. The first injection was 3 cm above the tragus of the ear,  second injection site was 1.5 cm to 3 cm up from the first injection site in line with the tragus of the ear. The third injection site was 1.5-3 cm forward between the first 2 injection sites. The fourth injection site was 1.5 cm posterior to the second injection site.  -Occipitalis muscle injection, 3 sites, bilaterally. The first injection was done one half way between the occipital protuberance and the tip of the mastoid process behind the ear. The second injection site was done lateral and superior to the first, 1 fingerbreadth from the first injection. The third injection site was 1 fingerbreadth superiorly and medially from the first injection site.  -Cervical paraspinal muscle injection, 2 sites, bilaterally first injection site was 1 cm from the midline of the cervical spine, 3 cm inferior to the lower border of the occipital protuberance. The second injection site was 1.5 cm superiorly and laterally to the first injection site. 2 additional injections left side were performed.   -Trapezius muscle injection was performed at 3 sites, bilaterally. The first injection site was in the upper trapezius muscle halfway between the inflection point of the neck, and the acromion. The second injection site was one half way between the acromion and the first injection site. The third injection was done between the first injection site and the inflection point of the neck.  3 additional injections left side were performed.   Will return for repeat injection in 3 months.  200 unit sof Botox was used, 180 units were injected, the rest of the Botox was wasted(20 units). The patient tolerated the procedure well, there were no complications of the above procedure.

## 2015-12-27 ENCOUNTER — Encounter: Payer: Self-pay | Admitting: Neurology

## 2016-01-04 DIAGNOSIS — H30033 Focal chorioretinal inflammation, peripheral, bilateral: Secondary | ICD-10-CM | POA: Insufficient documentation

## 2016-01-08 ENCOUNTER — Other Ambulatory Visit: Payer: Self-pay | Admitting: Neurology

## 2016-02-25 ENCOUNTER — Telehealth: Payer: Self-pay | Admitting: Neurology

## 2016-02-25 NOTE — Telephone Encounter (Signed)
Danielle- can you look into this please? Thank you!

## 2016-02-25 NOTE — Telephone Encounter (Signed)
Called patient to see if she is still receiving treatments. She did not schedule her next injection when she checked out from last apt. She did not answer. I left a VM asking her to call us back and get scheduled if she'd like to continue injections. The pharmacy is simply letting us know she is out of refills. This will be handled when I get her next shipment.   CALL CENTER: If she calls back please ask if she would like to continue, if she would and you feel comfortable making her an injection apt for September please do so, if not please skype me. Thanks

## 2016-02-25 NOTE — Telephone Encounter (Signed)
Jennifer/[facility] [phone] option 3 called regarding renewal of BOTOX prescription and to verify that patient is still being seen. Shipped out final refill in June.

## 2016-02-26 MED ORDER — ONABOTULINUMTOXINA 100 UNITS IJ SOLR: INTRAMUSCULAR | 3 refills | Status: DC

## 2016-02-26 NOTE — Telephone Encounter (Signed)
Brianna [name]- FYI  Ok per Dr Lucia Gaskins to provide refill on botox. Printed rx and had Dr Lucia Gaskins sign. Put on Brianna [name] W desk to Nurse, children[facility]. Ok to keep scheduling pt for botox.

## 2016-03-04 ENCOUNTER — Emergency Department (HOSPITAL_COMMUNITY)
Admission: EM | Admit: 2016-03-04 | Discharge: 2016-03-04 | Disposition: A | Discharge status: Home / Self Care | Payer: BC Managed Care – PPO | Attending: Emergency Medicine | Admitting: Emergency Medicine

## 2016-03-04 ENCOUNTER — Encounter (HOSPITAL_COMMUNITY): Payer: Self-pay | Admitting: Emergency Medicine

## 2016-03-04 DIAGNOSIS — T380X1A Poisoning by glucocorticoids and synthetic analogues, accidental (unintentional), initial encounter: Secondary | ICD-10-CM | POA: Diagnosis present

## 2016-03-04 DIAGNOSIS — Z87891 Personal history of nicotine dependence: Secondary | ICD-10-CM | POA: Insufficient documentation

## 2016-03-04 DIAGNOSIS — I10 Essential (primary) hypertension: Secondary | ICD-10-CM | POA: Insufficient documentation

## 2016-03-04 DIAGNOSIS — T50901A Poisoning by unspecified drugs, medicaments and biological substances, accidental (unintentional), initial encounter: Secondary | ICD-10-CM

## 2016-03-04 DIAGNOSIS — Z79899 Other long term (current) drug therapy: Secondary | ICD-10-CM | POA: Diagnosis not present

## 2016-03-04 HISTORY — DX: Retinal edema: H35.81

## 2016-03-04 HISTORY — DX: Unspecified macular degeneration: H35.30

## 2016-03-04 NOTE — ED Notes (Signed)
MD at bedside. 

## 2016-03-04 NOTE — ED Notes (Signed)
[facility] called and updated

## 2016-03-04 NOTE — ED Provider Notes (Signed)
[facility]-EMERGENCY DEPT Provider Note   CSN: [id number] Arrival date & time: 03/04/16  0706     History   Chief Complaint Chief Complaint  Patient presents with  . Drug Overdose    HPI Brianna [name] is a 65 y.o. female.  Patient presents to the emergency department after unintentional overdose of her topiramate today.  At 6:20 AM she accidentally took 6 of her topiramate 100 mg tabs thinking they were her 10 mg prednisone.  She realizes this mistake and came to the ER with concerned that she possibly overdosed herself.  She has no complaints at this time.  She denies nausea vomiting.  Denies abdominal pain.  No weakness or tiredness.  Denies sleepiness.  Family reports no confusion.  They report baseline mental status.   The history is provided by the patient.    Past Medical History:  Diagnosis Date  . Carotid artery occlusion   . Epilepsy (HCC)   . Glaucoma   . Hyperlipidemia   . Hypertension   . Macular degeneration   . Migraines   . Raynaud's disease   . Retinal edema   . Rheumatoid arthritis(714.0)     Patient Active Problem List   Diagnosis Date Noted  . History of carotid endarterectomy 06/27/2014  . Migraine without aura 04/19/2013  . Seizures (HCC) 04/19/2013  . 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 Cough 01/14/2011  . Pulmonary fibrosis (HCC) 01/14/2011    Past Surgical History:  Procedure Laterality Date  . CAROTID ARTERY ANGIOPLASTY  2008   Dr Madilyn FiremanHayes  . CAROTID ENDARTERECTOMY Left 11-15-2007  . [facility]  . CHOLECYSTECTOMY  1995  . CORNEAL TRANSPLANT     x 5   . [facility]  . EYE SURGERY      OB History    No data available       Home Medications    Prior to Admission medications   Medication Sig Start Date End Date Taking? Authorizing Provider  ALPHAGAN P 0.1 % SOLN Place 1 drop into both eyes. 06/20/14  Yes Historical Provider, MD  amLODipine (NORVASC) 5 MG tablet Take  5 mg by mouth daily.     Yes Historical Provider, MD  atorvastatin (LIPITOR) 40 MG tablet Take 40 mg by mouth daily.     Yes Historical Provider, MD  botulinum toxin Type A (BOTOX) 100 units SOLR injection Inject IM into the head and neck muscles every 3 months by provider in office 02/26/16  Yes Anson FretAntonia B Ahern, MD  butalbital-acetaminophen-caffeine (FIORICET, ESGIC) [phone] MG tablet Take 1 tablet by mouth every 6 (six) hours as needed for headache. 09/29/15  Yes Anson FretAntonia B Ahern, MD  Calcium Carbonate-Vit D-Min (CALCIUM 1200 PO) Take 2 capsules by mouth daily.     Yes Historical Provider, MD  Cholecalciferol (VITAMIN D PO) Take 1,000 Units by mouth daily.    Yes Historical Provider, MD  dorzolamide-timolol (COSOPT) 22.3-6.8 MG/ML ophthalmic solution Place 1 drop into both eyes 2 (two) times daily.  10/08/13  Yes Historical Provider, MD  ezetimibe (ZETIA) 10 MG tablet Take 10 mg by mouth daily.     Yes Historical Provider, MD  lansoprazole (PREVACID) 15 MG capsule Take 15 mg by mouth daily.     Yes Historical Provider, MD  leflunomide (ARAVA) 10 MG tablet Take 10 mg by mouth daily.   Yes Historical Provider, MD  LORazepam (ATIVAN) 0.5 MG tablet Take 0.5 mg by mouth daily  as needed for anxiety or sleep.    Yes Historical Provider, MD  phenytoin (DILANTIN) 300 MG ER capsule Take 1 capsule (300 mg total) by mouth daily. 08/19/14  Yes Anson Fret, MD  prednisoLONE acetate (PRED FORTE) 1 % ophthalmic suspension Place 1-4 drops into both eyes daily. 4 DROPS INTO LEFT EYE-- ONE DROP INTO RIGHT EYE 03/14/13  Yes Historical Provider, MD  predniSONE (DELTASONE) 10 MG tablet Take 60 mg by mouth daily with breakfast.   Yes Historical Provider, MD  promethazine (PHENERGAN) 25 MG tablet Take 25 mg by mouth every 6 (six) hours as needed for nausea or vomiting.    Yes Historical Provider, MD  SUMAtriptan (IMITREX) 6 MG/0.5ML SOLN injection Inject 0.5 mLs (6 mg total) into the skin as needed for migraine. 07/10/15   Yes Anson Fret, MD  timolol (TIMOPTIC) 0.5 % ophthalmic solution Place 1 drop into both eyes 2 (two) times daily.  07/30/13  Yes Historical Provider, MD  topiramate (TOPAMAX) 100 MG tablet Take 1 tablet (100 mg total) by mouth at bedtime. 12/25/15  Yes Anson Fret, MD  zolmitriptan (ZOMIG) 5 MG nasal solution Place 1 spray into the nose as needed for migraine. 12/25/15  Yes Anson Fret, MD    Family History Family History  Problem Relation Age of Onset  . Heart disease Mother   . Lung disease Mother     ? disease process  . Uterine cancer Mother   . Heart disease Father   . Clotting disorder Father   . Cervical cancer Maternal Aunt   . Prostate cancer Maternal Grandfather   . Rheum arthritis Sister   . Rheum arthritis Maternal Grandmother     Social History Social History  Substance Use Topics  . Smoking status: Former Smoker    Packs/day: 0.50    Years: 14.00    Types: Cigarettes    Quit date: 07/19/1988  . Smokeless tobacco: Never Used  . Alcohol use No     Allergies   Morphine; Morphine and related; Codeine; Hydrocodone-acetaminophen; Sulfa antibiotics; and Sulfamethoxazole   Review of Systems Review of Systems  All other systems reviewed and are negative.    Physical Exam Updated Vital Signs BP 139/79   Pulse 68   Temp 98.6 F (37 C) (Oral)   Resp 18   Ht 5\' 3"  (1.6 m)   Wt 125 lb (56.7 kg)   SpO2 95%   BMI 22.14 kg/m   Physical Exam  Constitutional: She is oriented to person, place, and time. She appears well-developed and well-nourished. No distress.  HENT:  Head: Normocephalic and atraumatic.  Eyes: EOM are normal.  Neck: Normal range of motion.  Cardiovascular: Normal rate, regular rhythm and normal heart sounds.   Pulmonary/Chest: Effort normal and breath sounds normal.  Abdominal: Soft. She exhibits no distension. There is no tenderness.  Musculoskeletal: Normal range of motion.  Neurological: She is alert and oriented to person,  place, and time.  Skin: Skin is warm and dry.  Psychiatric: She has a normal mood and affect. Judgment normal.  Nursing note and vitals reviewed.    ED Treatments / Results  Labs (all labs ordered are listed, but only abnormal results are displayed) Labs Reviewed - No data to display  EKG  EKG Interpretation  Date/Time:  Thursday March 04 2016 07:34:26 EDT Ventricular Rate:  74 PR Interval:    QRS Duration: 105 QT Interval:  400 QTC Calculation: 444 R Axis:   -34 Text  Interpretation:  Sinus rhythm Left axis deviation No significant change was found Confirmed by Kemper Heupel  MD, Caryn Bee ([id number]) on 03/04/2016 12:28:46 PM       Radiology No results found.  Procedures Procedures (including critical care time)  Medications Ordered in ED Medications - No data to display   Initial Impression / Assessment and Plan / ED Course  I have reviewed the triage vital signs and the nursing notes.  Pertinent labs & imaging results that were available during my care of the patient were reviewed by me and considered in my medical decision making (see chart for details).  Clinical Course    Poison control was contacted and recommended observation the emergency department for 4-6 hours.  Patient has remained asymptomatic in the emergency department.  She'll be discharged home in good condition.  Final Clinical Impressions(s) / ED Diagnoses   Final diagnoses:  None    New Prescriptions New Prescriptions   No medications on file     Azalia Bilis, MD 03/04/16 1228

## 2016-03-04 NOTE — ED Notes (Signed)
Pt and family states EDP has been been in and given plan of care. At this point will watch and continue to evaluate pt.

## 2016-03-04 NOTE — ED Triage Notes (Signed)
Patient accidentally took 600 mg topiramate at 0620. She is only supposed to take 100 mg topiramate. Patient complains of fatigue, no other symptoms. Patient attempted to self-induce emesis without success.

## 2016-03-04 NOTE — ED Triage Notes (Signed)
Poison control recommendations:  Expect mild to moderate symptoms: drowsiness, dizziness, confusion, nausea, emesis.  Peak medication action is: 1.5-4 hours  Check EKG Hydrate orally if tolerated Monitor for 4-6 hours. If after 4 hours patient is awake with stable vital signs and no drowsiness, she can go home.

## 2016-03-10 ENCOUNTER — Other Ambulatory Visit: Payer: Self-pay | Admitting: Neurology

## 2016-03-10 NOTE — Telephone Encounter (Signed)
[phone] - Fax Laurie/ [facility] called in botox refill request.

## 2016-03-10 NOTE — Telephone Encounter (Signed)
Danielle- can you look into this? I printed rx on 02/25/16 and placed on your desk to be sent. See phone note from 02/25/16

## 2016-03-18 NOTE — Telephone Encounter (Signed)
Called [facility] at [phone] . They Relayed they did receive new RX on 03-17-2016 and benefits are still binging verified . [facility] Relayed it could be up to five business days to get a response. I asked to please put this as a STAT order because patient had a upcoming apt on 03-25-2016. She put as STAT as requested.    Duwayne Heck Will call and check status again on Friday Sept. 1st .

## 2016-03-23 NOTE — Telephone Encounter (Signed)
Called and spoke to [facility] and they are still verifying Benefits . I will try back next business day 03/24/2016. Patient's apt is 03/25/2016.

## 2016-03-24 NOTE — Telephone Encounter (Addendum)
Called and spoke to [facility] and did PA over the phone. We should have an approval buy this coming Friday. Patient has been rescheduled for Sept 14 th . Patient is aware of details and understands Process. Botox TBD 03-31-2016.

## 2016-03-25 ENCOUNTER — Ambulatory Visit: Payer: BC Managed Care – PPO | Admitting: Neurology

## 2016-04-01 ENCOUNTER — Telehealth: Payer: Self-pay | Admitting: Neurology

## 2016-04-01 ENCOUNTER — Ambulatory Visit (INDEPENDENT_AMBULATORY_CARE_PROVIDER_SITE_OTHER): Payer: BC Managed Care – PPO | Admitting: Neurology

## 2016-04-01 DIAGNOSIS — G43719 Chronic migraine without aura, intractable, without status migrainosus: Secondary | ICD-10-CM

## 2016-04-01 NOTE — Progress Notes (Signed)
Botox: Botox-100unitsx2 vials Lot: [id number] Expiration: 09/2018 NDC: 1937-9024-09 [id number]  0.9% Sodium Chloride- 34mL total [id number] Expiration: 11/2017 NDC: 97989-211-94

## 2016-04-01 NOTE — Telephone Encounter (Signed)
Hey can you please contact Aneesah to schedule her Botox. I forgot how to do it & I didn't want to mess it up. Dr. Lucia Gaskins said any time during the day whatever was good for [facility]. Thank you the best number to contact her is [phone]

## 2016-04-02 DIAGNOSIS — G43719 Chronic migraine without aura, intractable, without status migrainosus: Secondary | ICD-10-CM | POA: Insufficient documentation

## 2016-04-02 NOTE — Progress Notes (Signed)

## 2016-04-06 NOTE — Telephone Encounter (Signed)
Called patient to schedule injection. She did not answer so I left a VM asking her to call me back.

## 2016-04-13 ENCOUNTER — Telehealth: Payer: Self-pay | Admitting: Neurology

## 2016-04-13 NOTE — Telephone Encounter (Signed)
Called Patient  And left her a message relaying that her Humira Pen had been delivered  Our office in error. Relayed on voice Mail I have placed Pen in refrigerator   so Pen will keep. [facility] has called her as well [phone].

## 2016-04-14 NOTE — Telephone Encounter (Signed)
Patient returned Danielle's call, please call [phone].

## 2016-04-23 ENCOUNTER — Telehealth: Payer: Self-pay | Admitting: Neurology

## 2016-04-23 NOTE — Telephone Encounter (Signed)
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 Please call to schedule appt. [phone]

## 2016-04-28 NOTE — Telephone Encounter (Signed)
Patient has been scheduled

## 2016-05-11 DIAGNOSIS — M47816 Spondylosis without myelopathy or radiculopathy, lumbar region: Secondary | ICD-10-CM | POA: Insufficient documentation

## 2016-05-11 DIAGNOSIS — J849 Interstitial pulmonary disease, unspecified: Secondary | ICD-10-CM | POA: Insufficient documentation

## 2016-05-11 DIAGNOSIS — M810 Age-related osteoporosis without current pathological fracture: Secondary | ICD-10-CM | POA: Insufficient documentation

## 2016-05-11 DIAGNOSIS — M059 Rheumatoid arthritis with rheumatoid factor, unspecified: Secondary | ICD-10-CM | POA: Insufficient documentation

## 2016-05-11 DIAGNOSIS — Z79899 Other long term (current) drug therapy: Secondary | ICD-10-CM | POA: Insufficient documentation

## 2016-05-11 NOTE — Progress Notes (Signed)
*IMAGE* Office Visit Note  [name]             Date of Birth: Sep 30, 1950           MRN: [id number]             PCP: Michiel Sites, MD Referring: Darci Needle, MD Visit Date: 05/12/2016    Subjective:  Follow-up   History of Present Illness: Brianna [name] is a 65 y.o. female with history of sero positive rheumatoid arthritis. Her arthritis has been very well controlled for several months. She states that she had some visit visual changes in right eye since January 2017. She had been seeing Dr. Sherryll Burger at [facility]. He diagnosed her with some changes in her retina we do not know the she was treated with oral steroids for about 3 months without any results. In August 2017 she had intraocular injection. She marrow was also started in August 2017 by Dr. Sherryll Burger. Her eye symptoms and the improved. She still has problems with dry eyes. She was hesitant to take methotrexate in the past due to history of ulcers. She was also placed on Humira in the past but it was discontinued due to frequent sinus injections. She does not have any joint pain or swelling at this time. She complains of lower back pain for which she has seen Dr. Farris Has area and she had MRI of her lumbar spine and the results are pending at this time. She is going to physical therapy for her lower back pain.  Activities of Daily Living:  Patient reports morning stiffness for 5 minutes.   Patient Reports nocturnal pain.  Difficulty dressing/grooming: Denies Difficulty climbing stairs: Denies Difficulty getting out of chair: Denies Difficulty using hands for taps, buttons, cutlery, and/or writing: Denies   Review of Systems  Constitutional: Positive for weakness.  HENT: Negative.   Eyes: Positive for visual disturbance and dryness.  Respiratory: Negative.   Cardiovascular: Negative.   Gastrointestinal: Negative.   Endocrine: Negative.   Genitourinary: Negative.   Musculoskeletal: Positive for  arthralgias, joint pain and morning stiffness.  Skin: Negative.   Allergic/Immunologic: Negative.   Neurological: Positive for seizures.  Hematological: Negative.   Psychiatric/Behavioral: The patient is nervous/anxious.     PMFS History:  Patient Active Problem List   Diagnosis Date Noted  . Iritis of right eye 05/12/2016  . Rheumatoid arthritis with positive rheumatoid factor (HCC) 05/11/2016  . ILD (interstitial lung disease) (HCC) 05/11/2016  . 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  . High risk medication use 05/11/2016  . Osteoporosis 05/11/2016  . 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 History of carotid endarterectomy 06/27/2014  . Migraine without aura 04/19/2013  . Seizures (HCC) 04/19/2013  . 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 Cough 01/14/2011  . Pulmonary fibrosis (HCC) 01/14/2011    Past Medical History:  Diagnosis Date  . Carotid artery occlusion   . Epilepsy (HCC)   . Glaucoma   . Hyperlipidemia   . Hypertension   . Macular degeneration   . Migraines   . Raynaud's disease   . Retinal edema   . Rheumatoid arthritis(714.0)     Family History  Problem Relation Age of Onset  . Heart disease Mother   . Lung disease Mother     ? disease process  . Uterine cancer Mother   . Heart disease Father   . Clotting disorder Father   . Cervical cancer Maternal Aunt   . Prostate  cancer Maternal Grandfather   . Rheum arthritis Sister   . Rheum arthritis Maternal Grandmother    Past Surgical History:  Procedure Laterality Date  . CAROTID ARTERY ANGIOPLASTY  2008   Dr Madilyn Fireman  . CAROTID ENDARTERECTOMY Left 11-15-2007  . cataract extraction Right 04-2014   [facility]  . CHOLECYSTECTOMY  1995  . CORNEAL TRANSPLANT     x 5   . CORNEAL TRANSPLANT Right 01-22-2014   [facility]  . EYE SURGERY-     Social History   Social History Narrative   Patient is married to [name]), has 2 children   Patient is right handed    Education level is Bachelor's degree   Caffeine consumption is 2 cups daily     Objective: Vital Signs: BP 137/71 (BP Location: Left Arm, Patient Position: Sitting, Cuff Size: Large)   Pulse 76   Resp 14   Ht 5\' 2"  (1.575 m)   Wt 126 lb (57.2 kg)   BMI 23.05 kg/m    Physical Exam  Constitutional: She is oriented to person, place, and time. She appears well-developed and well-nourished.  HENT:  Head: Normocephalic and atraumatic.  Eyes: Conjunctivae and EOM are normal.  Neck: Normal range of motion. Neck supple. No thyromegaly present.  Cardiovascular: Normal rate, regular rhythm, normal heart sounds and intact distal pulses.   Pulmonary/Chest: Effort normal and breath sounds normal.  Abdominal: Soft. Bowel sounds are normal.  Lymphadenopathy:    She has no cervical adenopathy.  Neurological: She is alert and oriented to person, place, and time.  Skin: Skin is warm and dry. Capillary refill takes more than 3 seconds.  Psychiatric: She has a normal mood and affect. Her behavior is normal.     Musculoskeletal Exam: C-spine and thoracic lumbar spine were all good range of motion , shoulder joints of the joints wrist joints with good range of motion, she has thickening of her bilateral MCP joints and PIP joints but no synovitis, hip joints knee joints ankles MTPs with good range of motion with no synovitis.  CDAI Exam: CDAI Homunculus Exam:   Joint Counts:  CDAI Tender Joint count: 0 CDAI Swollen Joint count: 0  Global Assessments:  Patient Global Assessment: 0 Provider Global Assessment: 10    Investigation: Findings:  T score -3.3 om 7/14 Labs on 04/29/2016 comprehensive metabolic panel showed potassium 5.4 and GFR was normal CBC was within normal limits    Imaging: No results found.  Speciality Comments: No specialty comments available.    Procedures:  No procedures performed Allergies: Morphine; Morphine and related; Codeine; Hydrocodone-acetaminophen;  Sulfa antibiotics; and Sulfamethoxazole   Assessment / Plan: Visit Diagnoses: Rheumatoid arthritis with positive rheumatoid factor, positiveCCP Her rheumatoid arthritis has been. Well controlled with Arava.  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 She has been seen by Dr. 06/29/2016 at [facility]. She was started on Humira which she is tolerating well. She states her eye symptoms are quite well controlled now.  ILD (interstitial lung disease) (HCC) She has been advised to see Dr. [facility] her pulmonologist.  Osteoarthritis of lumbar spine, unspecified spinal osteoarthritis complication status She will be seeing Dr. Sherene Sires for follow-up on the MRI of her lumbar spine.  High risk medication use She is on Humira and Farris Has. We're checking her labs every 3 months. I will obtained standing orders today and then every 3 months to monitor for drug toxicity.  Osteoporosis, unspecified osteoporosis type, unspecified pathological fracture presence  She has  been treated with probably in the past. She is due to get a bone density. As she has a started Humira she would not be able to take Prolia. We will discuss use of Fosamax with her today. Indications side effects contraindications were discussed. She'll be starting Fosamax 70 mg by mouth every week.  No problem-specific Assessment & Plan notes found for this encounter.   Follow-Up Instructions: Return for Rheumatoid arthritis.  Orders: Orders Placed This Encounter  Procedures  . DG DXA FRACTURE ASSESSMENT  . CBC with Differential/Platelet  . COMPLETE METABOLIC PANEL WITH GFR  . VITAMIN D 25 Hydroxy (Vit-D Deficiency, Fractures)   Meds ordered this encounter  Medications  . Adalimumab 40 MG/0.8ML PNKT    Sig: Inject 40 mg into the skin.  . Bromfenac Sodium (BROMSITE) 0.075 % SOLN    Sig: INSTILL 1 DROP IN BOTH EYES TWICE DAILY  . Lifitegrast (XIIDRA) 5 % SOLN    Sig: INSTILL 1 DROP INTO  BOTH EYES TWICE DAILY

## 2016-05-12 ENCOUNTER — Encounter: Payer: Self-pay | Admitting: Rheumatology

## 2016-05-12 ENCOUNTER — Ambulatory Visit (INDEPENDENT_AMBULATORY_CARE_PROVIDER_SITE_OTHER): Payer: BC Managed Care – PPO | Admitting: Rheumatology

## 2016-05-12 VITALS — BP 137/71 | HR 76 | Resp 14 | Ht 62.0 in | Wt 126.0 lb

## 2016-05-12 DIAGNOSIS — J849 Interstitial pulmonary disease, unspecified: Secondary | ICD-10-CM

## 2016-05-12 DIAGNOSIS — M810 Age-related osteoporosis without current pathological fracture: Secondary | ICD-10-CM | POA: Diagnosis not present

## 2016-05-12 DIAGNOSIS — Z79899 Other long term (current) drug therapy: Secondary | ICD-10-CM | POA: Diagnosis not present

## 2016-05-12 DIAGNOSIS — M47816 Spondylosis without myelopathy or radiculopathy, lumbar region: Secondary | ICD-10-CM

## 2016-05-12 DIAGNOSIS — M059 Rheumatoid arthritis with rheumatoid factor, unspecified: Secondary | ICD-10-CM | POA: Diagnosis not present

## 2016-05-12 DIAGNOSIS — H209 Unspecified iridocyclitis: Secondary | ICD-10-CM | POA: Diagnosis not present

## 2016-05-12 MED ORDER — ALENDRONATE SODIUM 70 MG PO TABS: 70.0000 mg | ORAL_TABLET | ORAL | 3 refills | Status: AC

## 2016-05-12 NOTE — Patient Instructions (Signed)
Standing Labs We placed an order today for your standing lab work.    Please come back and get your standing labs in 3 months  We have open lab Monday through Friday from 8:30-11:30 AM and 1-4 PM at the office of Dr. Arbutus Ped, PA.   The office is located at [address], [address], [place], [place] [address] No appointment is necessary.   Labs are drawn by [employer] [facility].  You may receive a bill from [facility] for your lab work.     Alendronate tablets What is this medicine? ALENDRONATE (a LEN droe nate) slows calcium loss from bones. It helps to make normal healthy bone and to slow bone loss in people with Paget's disease and osteoporosis. It may be used in others at risk for bone loss. This medicine may be used for other purposes; ask your health care provider or pharmacist if you have questions. What should I tell my health care provider before I take this medicine? They need to know if you have any of these conditions: -dental disease -esophagus, stomach, or intestine problems, like acid reflux or GERD -kidney disease -low blood calcium -low vitamin D -problems sitting or standing 30 minutes -trouble swallowing -an unusual or allergic reaction to alendronate, other medicines, foods, dyes, or preservatives -pregnant or trying to get pregnant -breast-feeding How should I use this medicine? You must take this medicine exactly as directed or you will lower the amount of the medicine you absorb into your body or you may cause yourself harm. Take this medicine by mouth first thing in the morning, after you are up for the day. Do not eat or drink anything before you take your medicine. Swallow the tablet with a full glass (6 to 8 fluid ounces) of plain water. Do not take this medicine with any other drink. Do not chew or crush the tablet. After taking this medicine, do not eat breakfast, drink, or take any medicines or vitamins for at least 30 minutes. Sit or stand up for at  least 30 minutes after you take this medicine; do not lie down. Do not take your medicine more often than directed. Talk to your pediatrician regarding the use of this medicine in children. Special care may be needed. Overdosage: If you think you have taken too much of this medicine contact a poison control center or emergency room at once. NOTE: This medicine is only for you. Do not share this medicine with others. What if I miss a dose? If you miss a dose, do not take it later in the day. Continue your normal schedule starting the next morning. Do not take double or extra doses. What may interact with this medicine? -aluminum hydroxide -antacids -aspirin -calcium supplements -drugs for inflammation like ibuprofen, naproxen, and others -iron supplements -magnesium supplements -vitamins with minerals This list may not describe all possible interactions. Give your health care provider a list of all the medicines, herbs, non-prescription drugs, or dietary supplements you use. Also tell them if you smoke, drink alcohol, or use illegal drugs. Some items may interact with your medicine. What should I watch for while using this medicine? Visit your doctor or health care professional for regular checks ups. It may be some time before you see benefit from this medicine. Do not stop taking your medicine except on your doctor's advice. Your doctor or health care professional may order blood tests and other tests to see how you are doing. You should make sure you get enough calcium and  vitamin D while you are taking this medicine, unless your doctor tells you not to. Discuss the foods you eat and the vitamins you take with your health care professional. Some people who take this medicine have severe bone, joint, and/or muscle pain. This medicine may also increase your risk for a broken thigh bone. Tell your doctor right away if you have pain in your upper leg or groin. Tell your doctor if you have any pain  that does not go away or that gets worse. This medicine can make you more sensitive to the sun. If you get a rash while taking this medicine, sunlight may cause the rash to get worse. Keep out of the sun. If you cannot avoid being in the sun, wear protective clothing and use sunscreen. Do not use sun lamps or tanning beds/booths. What side effects may I notice from receiving this medicine? Side effects that you should report to your doctor or health care professional as soon as possible: -allergic reactions like skin rash, itching or hives, swelling of the face, lips, or tongue -black or tarry stools -bone, muscle or joint pain -changes in vision -chest pain -heartburn or stomach pain -jaw pain, especially after dental work -pain or trouble when swallowing -redness, blistering, peeling or loosening of the skin, including inside the mouth Side effects that usually do not require medical attention (report to your doctor or health care professional if they continue or are bothersome): -changes in taste -diarrhea or constipation -eye pain or itching -headache -nausea or vomiting -stomach gas or fullness This list may not describe all possible side effects. Call your doctor for medical advice about side effects. You may report side effects to FDA at 1-800-FDA-1088. Where should I keep my medicine? Keep out of the reach of children. Store at room temperature of 15 and 30 degrees C (59 and 86 degrees F). Throw away any unused medicine after the expiration date. NOTE: This sheet is a summary. It may not cover all possible information. If you have questions about this medicine, talk to your doctor, pharmacist, or health care provider.    2016, Elsevier/Gold Standard. (2011-01-01 08:56:09)

## 2016-05-12 NOTE — Progress Notes (Signed)
Pharmacy Note  Subjective:  Patient presents today to the [facility] to see Dr. Corliss Skains.  Patient was prescribed Fosamax by Dr. Corliss Skains.  Patient was seen by the pharmacist for counseling on alendronate (Fosamax).    Assessment/Plan:  Counseled patient that Fosamax is an oral bisphosphonate that reduces bone turnover by inhibiting osteoclasts that chew up bone.  Counseled patient on purpose, proper use, and adverse effects of Fosamax.  Reviewed with patient the Fosamax should be taken once weekly.  Fosamax must be taking first thing in the morning, with a full glass of water, and she must wait an hour prior to eating food.  Also advised patient that she should not lie down until after she has eaten.  Reviewed importance of taking calcium and vitamin D with bisphosphonate therapy.  Reviewed recommendations of 1200 mg of calcium and 600 units of vitamin D daily either through diet or supplementation.  Provided patient with medication education material and answered all questions.  Reviewed adverse events of Fosamax including risk of nausea & diarrhea, headache, and muscle & bone pain.  Reviewed rare adverse effect of osteonecrosis of the jaw and advised patient to alert her dentist that she is on Fosamax prior to any major dental work.  Patient confirms she does not have any major dental work scheduled at this time.  Prescription for Fosamax 70 mg once a week was sent to patient's pharmacy.    Lilla Shook, Pharm.D., BCPS Clinical Pharmacist Pager: [phone] Phone: [phone] 05/12/2016 9:35 AM

## 2016-05-24 DIAGNOSIS — M5416 Radiculopathy, lumbar region: Secondary | ICD-10-CM | POA: Insufficient documentation

## 2016-05-24 DIAGNOSIS — M51379 Other intervertebral disc degeneration, lumbosacral region without mention of lumbar back pain or lower extremity pain: Secondary | ICD-10-CM | POA: Insufficient documentation

## 2016-05-24 DIAGNOSIS — M5137 Other intervertebral disc degeneration, lumbosacral region: Secondary | ICD-10-CM | POA: Insufficient documentation

## 2016-06-15 ENCOUNTER — Other Ambulatory Visit: Payer: Self-pay | Admitting: Radiology

## 2016-06-15 DIAGNOSIS — E2839 Other primary ovarian failure: Secondary | ICD-10-CM

## 2016-06-15 DIAGNOSIS — M810 Age-related osteoporosis without current pathological fracture: Secondary | ICD-10-CM

## 2016-06-24 ENCOUNTER — Telehealth (INDEPENDENT_AMBULATORY_CARE_PROVIDER_SITE_OTHER): Payer: Self-pay | Admitting: *Deleted

## 2016-06-24 NOTE — Telephone Encounter (Signed)
Per [place], patient had DEXA scan April, 2017. Order for DEXA cancelled.

## 2016-06-28 DIAGNOSIS — T868409 Corneal transplant rejection, unspecified eye: Secondary | ICD-10-CM | POA: Insufficient documentation

## 2016-06-28 DIAGNOSIS — H18231 Secondary corneal edema, right eye: Secondary | ICD-10-CM | POA: Insufficient documentation

## 2016-06-30 ENCOUNTER — Encounter: Payer: Self-pay | Admitting: Family

## 2016-07-01 ENCOUNTER — Ambulatory Visit (INDEPENDENT_AMBULATORY_CARE_PROVIDER_SITE_OTHER): Payer: BC Managed Care – PPO | Admitting: Family

## 2016-07-01 ENCOUNTER — Ambulatory Visit (HOSPITAL_COMMUNITY)
Admission: RE | Admit: 2016-07-01 | Discharge: 2016-07-01 | Disposition: A | Discharge status: Home / Self Care | Payer: BC Managed Care – PPO | Source: Ambulatory Visit | Attending: Vascular Surgery | Admitting: Vascular Surgery

## 2016-07-01 ENCOUNTER — Encounter: Payer: Self-pay | Admitting: Family

## 2016-07-01 VITALS — BP 160/90 | HR 73 | Temp 98.1°F | Resp 18 | Wt 126.0 lb

## 2016-07-01 DIAGNOSIS — Z48812 Encounter for surgical aftercare following surgery on the circulatory system: Secondary | ICD-10-CM | POA: Diagnosis not present

## 2016-07-01 DIAGNOSIS — Z9889 Other specified postprocedural states: Secondary | ICD-10-CM | POA: Diagnosis not present

## 2016-07-01 DIAGNOSIS — I6522 Occlusion and stenosis of left carotid artery: Secondary | ICD-10-CM

## 2016-07-01 DIAGNOSIS — I6521 Occlusion and stenosis of right carotid artery: Secondary | ICD-10-CM | POA: Insufficient documentation

## 2016-07-01 LAB — VAS US CAROTID
LEFT ECA DIAS: -6 cm/s
Left CCA dist dias: -16 cm/s
Left CCA dist sys: -70 cm/s
Left CCA prox dias: 17 cm/s
Left CCA prox sys: 75 cm/s
Left ICA dist dias: -23 cm/s
Left ICA dist sys: -68 cm/s
Left ICA prox dias: -16 cm/s
Left ICA prox sys: -50 cm/s
RIGHT CCA MID DIAS: 17 cm/s
RIGHT ECA DIAS: -9 cm/s
Right CCA prox dias: 11 cm/s
Right CCA prox sys: 72 cm/s
Right cca dist sys: -99 cm/s

## 2016-07-01 NOTE — Patient Instructions (Signed)
Stroke Prevention Some medical conditions and behaviors are associated with an increased chance of having a stroke. You may prevent a stroke by making healthy choices and managing medical conditions. How can I reduce my risk of having a stroke?  Stay physically active. Get at least 30 minutes of activity on most or all days.  Do not smoke. It may also be helpful to avoid exposure to secondhand smoke.  Limit alcohol use. Moderate alcohol use is considered to be:  No more than 2 drinks per day for men.  No more than 1 drink per day for nonpregnant women.  Eat healthy foods. This involves:  Eating 5 or more servings of fruits and vegetables a day.  Making dietary changes that address high blood pressure (hypertension), high cholesterol, diabetes, or obesity.  Manage your cholesterol levels.  Making food choices that are high in fiber and low in saturated fat, trans fat, and cholesterol may control cholesterol levels.  Take any prescribed medicines to control cholesterol as directed by your health care provider.  Manage your diabetes.  Controlling your carbohydrate and sugar intake is recommended to manage diabetes.  Take any prescribed medicines to control diabetes as directed by your health care provider.  Control your hypertension.  Making food choices that are low in salt (sodium), saturated fat, trans fat, and cholesterol is recommended to manage hypertension.  Ask your health care provider if you need treatment to lower your blood pressure. Take any prescribed medicines to control hypertension as directed by your health care provider.  If you are 18-39 years of age, have your blood pressure checked every 3-5 years. If you are 40 years of age or older, have your blood pressure checked every year.  Maintain a healthy weight.  Reducing calorie intake and making food choices that are low in sodium, saturated fat, trans fat, and cholesterol are recommended to manage  weight.  Stop drug abuse.  Avoid taking birth control pills.  Talk to your health care provider about the risks of taking birth control pills if you are over 35 years old, smoke, get migraines, or have ever had a blood clot.  Get evaluated for sleep disorders (sleep apnea).  Talk to your health care provider about getting a sleep evaluation if you snore a lot or have excessive sleepiness.  Take medicines only as directed by your health care provider.  For some people, aspirin or blood thinners (anticoagulants) are helpful in reducing the risk of forming abnormal blood clots that can lead to stroke. If you have the irregular heart rhythm of atrial fibrillation, you should be on a blood thinner unless there is a good reason you cannot take them.  Understand all your medicine instructions.  Make sure that other conditions (such as anemia or atherosclerosis) are addressed. Get help right away if:  You have sudden weakness or numbness of the face, arm, or leg, especially on one side of the body.  Your face or eyelid droops to one side.  You have sudden confusion.  You have trouble speaking (aphasia) or understanding.  You have sudden trouble seeing in one or both eyes.  You have sudden trouble walking.  You have dizziness.  You have a loss of balance or coordination.  You have a sudden, severe headache with no known cause.  You have new chest pain or an irregular heartbeat. Any of these symptoms may represent a serious problem that is an emergency. Do not wait to see if the symptoms will go away.   Get medical help at once. Call your local emergency services (911 in [place] Do not drive yourself to the hospital. This information is not intended to replace advice given to you by your health care provider. Make sure you discuss any questions you have with your health care provider. Document Released: 08/12/2004 Document Revised: 12/11/2015 Document Reviewed: 01/05/2013 Elsevier  Interactive Patient Education  2017 Elsevier Inc.  

## 2016-07-01 NOTE — Progress Notes (Signed)
Chief Complaint: Follow up Extracranial Carotid Artery Stenosis   History of Present Illness  Brianna [name] is a 65 y.o. female patient of Dr. Darrick Penna who is s/p left carotid endarterectomy on 11/15/2007.  She reports 2 TIA's; the second was about 2006 as manifested by expressive aphasia, right hemiparesis, severe headache, and transient left monocular loss of vision. She was told that brain imaging found evidence of a previous TIA but she does not recall any sx's of this. She was evaluated by Dr. Sandria Manly at that time, neurologist.   She has slight residual right leg and arm weakness when she gets overly tired.  Pt states her left pupil remains dilated since she had several corneal surgeries.  She denies claudication sx's with walking, denies any hx of cardiac problems.  She is receiving botox injections for headaches, managed by Dr. Lucia Gaskins  She has cornea surgery coming up and contemplating lumbar spine surgery, reports "slipped disc".   Pt Diabetic: no Pt smoker: former smoker, quit at age 36, started at age 61  Pt meds include: Statin : yes ASA: yes Other anticoagulants/antiplatelets: no   Past Medical History:  Diagnosis Date  . Carotid artery occlusion   . Epilepsy (HCC)   . Glaucoma   . Hyperlipidemia   . Hypertension   . Macular degeneration   . Migraines   . Raynaud's disease   . Retinal edema   . Rheumatoid arthritis(714.0)     Social History Social History  Substance Use Topics  . Smoking status: Former Smoker    Packs/day: 0.50    Years: 14.00    Types: Cigarettes    Quit date: 07/19/1988  . Smokeless tobacco: Never Used  . Alcohol use No    Family History Family History  Problem Relation Age of Onset  . Heart disease Mother   . Lung disease Mother     ? disease process  . Uterine cancer Mother   . Heart disease Father   . Clotting disorder Father   . Cervical cancer Maternal Aunt   . Prostate cancer Maternal Grandfather   . Rheum  arthritis Sister   . Rheum arthritis Maternal Grandmother     Surgical History Past Surgical History:  Procedure Laterality Date  . CAROTID ARTERY ANGIOPLASTY  2008   Dr Madilyn Fireman  . CAROTID ENDARTERECTOMY Left 11-15-2007  . cataract extraction Right 04-2014   [facility]  . CHOLECYSTECTOMY  1995  . CORNEAL TRANSPLANT     x 5 ; steroid inj. retnal information  . CORNEAL TRANSPLANT Right 01-22-2014   [facility]  . EYE SURGERY      Allergies  Allergen Reactions  . Morphine Other (See Comments)    Difficulty breathing  . Morphine And Related Shortness Of Breath  . Codeine Nausea And Vomiting  . Hydrocodone-Acetaminophen Nausea And Vomiting    But tolerates tylenol  . Sulfa Antibiotics Rash    "Large bumps"  . Sulfamethoxazole Rash    Current Outpatient Prescriptions  Medication Sig Dispense Refill  . Adalimumab 40 MG/0.8ML PNKT Inject 40 mg into the skin.    Marland Kitchen alendronate (FOSAMAX) 70 MG tablet Take 1 tablet (70 mg total) by mouth once a week. Take with a full glass of water on an empty stomach. 12 tablet 3  . ALPHAGAN P 0.1 % SOLN Place 1 drop into both eyes.  11  . amLODipine (NORVASC) 5 MG tablet Take 5 mg by mouth daily.      Marland Kitchen atorvastatin (LIPITOR) 40  MG tablet Take 40 mg by mouth daily.      . botulinum toxin Type A (BOTOX) 100 units SOLR injection Inject IM into the head and neck muscles every 3 months by provider in office 2 vial 3  . Bromfenac Sodium (BROMSITE) 0.075 % SOLN INSTILL 1 DROP IN BOTH EYES TWICE DAILY    . butalbital-acetaminophen-caffeine (FIORICET, ESGIC) 50-325-40 MG tablet Take 1 tablet by mouth every 6 (six) hours as needed for headache. 10 tablet 3  . Calcium Carbonate-Vit D-Min (CALCIUM 1200 PO) Take 2 capsules by mouth daily.      . Cholecalciferol (VITAMIN D PO) Take 1,000 Units by mouth daily.     . dorzolamide-timolol (COSOPT) 22.3-6.8 MG/ML ophthalmic solution Place 1 drop into both eyes 2 (two) times daily.     Marland Kitchen ezetimibe (ZETIA) 10 MG  tablet Take 10 mg by mouth daily.      . lansoprazole (PREVACID) 15 MG capsule Take 15 mg by mouth daily.      Marland Kitchen leflunomide (ARAVA) 10 MG tablet Take 10 mg by mouth daily.    . Lifitegrast (XIIDRA) 5 % SOLN INSTILL 1 DROP INTO BOTH EYES TWICE DAILY    . LORazepam (ATIVAN) 0.5 MG tablet Take 0.5 mg by mouth daily as needed for anxiety or sleep.     . phenytoin (DILANTIN) 300 MG ER capsule Take 1 capsule (300 mg total) by mouth daily. 30 capsule 11  . prednisoLONE acetate (PRED FORTE) 1 % ophthalmic suspension Place 1-4 drops into both eyes daily. 4 DROPS INTO LEFT EYE-- ONE DROP INTO RIGHT EYE    . predniSONE (DELTASONE) 10 MG tablet Take 60 mg by mouth daily with breakfast.    . promethazine (PHENERGAN) 25 MG tablet Take 25 mg by mouth every 6 (six) hours as needed for nausea or vomiting.     . SUMAtriptan (IMITREX) 6 MG/0.5ML SOLN injection Inject 0.5 mLs (6 mg total) into the skin as needed for migraine. 8 vial 3  . timolol (TIMOPTIC) 0.5 % ophthalmic solution Place 1 drop into both eyes 2 (two) times daily.     Marland Kitchen topiramate (TOPAMAX) 100 MG tablet Take 1 tablet (100 mg total) by mouth at bedtime. 30 tablet 12  . zolmitriptan (ZOMIG) 5 MG nasal solution Place 1 spray into the nose as needed for migraine. 12 Units 11   Current Facility-Administered Medications  Medication Dose Route Frequency Provider Last Rate Last Dose  . botulinum toxin Type A (BOTOX) injection 200 Units  200 Units Intramuscular Once Ramond Marrow, DO      . botulinum toxin Type A (BOTOX) injection 200 Units  200 Units Intramuscular Once Ramond Marrow, DO      . botulinum toxin Type A (BOTOX) injection 200 Units  200 Units Intramuscular Once Ramond Marrow, DO      . botulinum toxin Type A (BOTOX) injection 200 Units  200 Units Intramuscular Once Ramond Marrow, DO      . pneumococcal 23 valent vaccine (PNU-IMMUNE) injection 0.5 mL  0.5 mL Intramuscular Once Nyoka Cowden, MD        Review of Systems : See HPI for  pertinent positives and negatives.  Physical Examination  Vitals:   07/01/16 0908 07/01/16 0911  BP: (!) 165/94 (!) 160/90  Pulse: 73 73  Resp: 18   Temp: 98.1 F (36.7 C)   SpO2: 94%   Weight: 126 lb (57.2 kg)    Body mass index is 23.05 kg/m.  General: WDWN female in NAD GAIT: normal Eyes: PERRLA Pulmonary: Respirations are non-labored, CTAB, no rales,  rhonchi,  or wheezing.  Cardiac: regular rhythm,  no detected murmur.  VASCULAR EXAM Carotid Bruits Right Left   Negative Negative    Aorta is not palpable. Radial pulses are 2+ palpable and equal.                                                                                                                                          LE Pulses Right Left       POPLITEAL  not palpable  not palpable       POSTERIOR TIBIAL   palpable   palpable       DORSALIS PEDIS      ANTERIOR TIBIAL  palpable palpable    Gastrointestinal: soft, nontender, BS WNL, no r/g, no palpable masses.  Musculoskeletal: No muscle atrophy/wasting. M/S 5/5 throughout, extremities without ischemic changes.  Neurologic: A&O X 3; Appropriate Affect, Speech is normal, CN 2-12 intact except left pupil is dilated, larger than right pupil, pain and light touch intact in extremities, Motor exam as listed above.    Assessment: Brianna [name] is a 65 y.o. female who is s/p left carotid endarterectomy on 11/15/2007.  She reports 2 TIA's; the second was about 2006 as manifested by expressive aphasia, right hemiparesis, severe headache, and transient left monocular loss of vision. She was told that brain imaging found evidence of a previous TIA but she does not recall any sx's of this. She was evaluated by Dr. Sandria Manly at that time, neurologist.   She has slight residual right leg and arm weakness when she gets overly tired.   DATA  Today's carotid duplex suggests minimal right ICA stenosis. Patent left CEA site with no restenosis.  No significant  stenosis of the bilateral CCA or ECA. Bilateral vertebral artery flow is antegrade.  Bilateral subclavian artery waveforms are normal.  No significant change compared to the last exam on 07-03-15.   Plan: Follow-up in 1 year with Carotid Duplex scan   I discussed in depth with the patient the nature of atherosclerosis, and emphasized the importance of maximal medical management including strict control of blood pressure, blood glucose, and lipid levels, obtaining regular exercise, and continued cessation of smoking.  The patient is aware that without maximal medical management the underlying atherosclerotic disease process will progress, limiting the benefit of any interventions. The patient was given information about stroke prevention and what symptoms should prompt the patient to seek immediate medical care. Thank you for allowing [place] to participate in this patient's care.  Charisse March, RN, MSN, FNP-C [facility] Office: [phone]  Clinic Physician: Edilia Bo  07/01/16 9:14 AM

## 2016-07-06 NOTE — Addendum Note (Signed)
Addended by: Burton Apley A on: 07/06/2016 11:43 AM   Modules accepted: Orders

## 2016-07-08 ENCOUNTER — Ambulatory Visit (INDEPENDENT_AMBULATORY_CARE_PROVIDER_SITE_OTHER): Payer: BC Managed Care – PPO | Admitting: Neurology

## 2016-07-08 VITALS — BP 160/89 | HR 81 | Ht 62.0 in

## 2016-07-08 DIAGNOSIS — G43719 Chronic migraine without aura, intractable, without status migrainosus: Secondary | ICD-10-CM

## 2016-07-08 NOTE — Progress Notes (Signed)
Botox-100unitsx2 vials Lot: [id number] Expiration: 11/2018 NDC: 3903-0092-33 [id number]  0.9% Sodium Chloride bacteriostatic- 42mL total Lot: [id number] Expiration: 12/17/2017 NDC: 5456-2563-89  Dx: H73.428 Specialty

## 2016-07-08 NOTE — Progress Notes (Signed)

## 2016-07-19 HISTORY — PX: GLAUCOMA SURGERY: SHX656

## 2016-09-06 DIAGNOSIS — Z87898 Personal history of other specified conditions: Secondary | ICD-10-CM | POA: Insufficient documentation

## 2016-09-06 DIAGNOSIS — Z8669 Personal history of other diseases of the nervous system and sense organs: Secondary | ICD-10-CM | POA: Insufficient documentation

## 2016-09-06 NOTE — Progress Notes (Signed)
Office Visit Note  [name]             Date of Birth: [birth year]           MRN: [id number]             PCP: Michiel Sites, MD Referring: Darci Needle, MD Visit Date: 09/13/2016 Occupation: @GUAROCC @    Subjective:   Lower back pain  History of Present Illness: Brianna [name] is a 66 y.o. female with history of rheumatoid arthritis. She states she had failure of her right cornea surgery. She denies any joint pain or joint swelling. She's been tolerating her medications well. She denies any shortness of breath but has some intermittent coughing. The lower back pain persist.  Activities of Daily Living:  Patient reports morning stiffness for <5 minutes.   Patient Denies nocturnal pain.  Difficulty dressing/grooming: Denies Difficulty climbing stairs: Denies Difficulty getting out of chair: Denies Difficulty using hands for taps, buttons, cutlery, and/or writing: Denies   Review of Systems  Constitutional: Negative for fatigue, night sweats, weight gain, weight loss and weakness.  HENT: Negative for mouth sores, trouble swallowing, trouble swallowing, mouth dryness and nose dryness.   Eyes: Positive for visual disturbance and dryness. Negative for pain and redness.       Right eye vision loss  Respiratory: Negative for cough, shortness of breath and difficulty breathing.   Cardiovascular: Negative for chest pain, palpitations, hypertension, irregular heartbeat and swelling in legs/feet.  Gastrointestinal: Negative for blood in stool, constipation and diarrhea.  Endocrine: Negative for increased urination.  Genitourinary: Negative for vaginal dryness.  Musculoskeletal: Positive for arthralgias and joint pain. Negative for joint swelling, myalgias, muscle weakness, morning stiffness, muscle tenderness and myalgias.  Skin: Negative for color change, rash, hair loss, skin tightness, ulcers and sensitivity to sunlight.  Allergic/Immunologic: Negative for  susceptible to infections.  Neurological: Negative for dizziness, memory loss and night sweats.  Hematological: Negative for swollen glands.  Psychiatric/Behavioral: Positive for depressed mood. Negative for sleep disturbance. The patient is not nervous/anxious.     PMFS History:  Patient Active Problem List   Diagnosis Date Noted  . History of macular degeneration 09/13/2016  . History of migraine 09/06/2016  . History of seizures 09/06/2016  . Iritis of right eye 05/12/2016  . Rheumatoid arthritis with positive rheumatoid factor (HCC) 05/11/2016  . ILD (interstitial lung disease) (HCC) 05/11/2016  . 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  . High risk medication use 05/11/2016  . Osteoporosis 05/11/2016  . 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 History of carotid endarterectomy 06/27/2014  . Migraine without aura 04/19/2013  . Seizures (HCC) 04/19/2013  . 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 Cough 01/14/2011  . Pulmonary fibrosis (HCC) 01/14/2011    Past Medical History:  Diagnosis Date  . Carotid artery occlusion   . Epilepsy (HCC)   . Glaucoma   . Hyperlipidemia   . Hypertension   . Macular degeneration   . Migraines   . Raynaud's disease   . Retinal edema   . Rheumatoid arthritis(714.0)     Family History  Problem Relation Age of Onset  . Heart disease Mother   . Lung disease Mother     ? disease process  . Uterine cancer Mother   . Heart disease Father   . Clotting disorder Father   . Cervical cancer Maternal Aunt   . Prostate cancer Maternal Grandfather   . Rheum arthritis Sister   .  Rheum arthritis Maternal Grandmother    Past Surgical History:  Procedure Laterality Date  . CAROTID ARTERY ANGIOPLASTY  2008   Dr Madilyn Fireman  . CAROTID ENDARTERECTOMY Left 11-15-2007  . cataract extraction Right 04-2014   [facility]  . CHOLECYSTECTOMY  1995  . CORNEAL TRANSPLANT     x 5 ; steroid inj. retnal information  . CORNEAL  TRANSPLANT Right 01-22-2014   [facility]  . EYE SURGERY     Social History   Social History Narrative   Patient is married to Brianna [name]), has 2 children   Patient is right handed   Education level is Bachelor's degree   Caffeine consumption is 2 cups daily     Objective: Vital Signs: BP (!) 144/81   Pulse 73   Resp 13   Ht 5\' 2"  (1.575 m)   Wt 130 lb (59 kg)   BMI 23.78 kg/m    Physical Exam  Constitutional: She is oriented to person, place, and time. She appears well-developed and well-nourished.  HENT:  Head: Normocephalic and atraumatic.  Eyes: Conjunctivae and EOM are normal.  Neck: Normal range of motion.  Cardiovascular: Normal rate, regular rhythm, normal heart sounds and intact distal pulses.   Pulmonary/Chest: Effort normal and breath sounds normal.  Abdominal: Soft. Bowel sounds are normal.  Lymphadenopathy:    She has no cervical adenopathy.  Neurological: She is alert and oriented to person, place, and time.  Skin: Skin is warm and dry. Capillary refill takes less than 2 seconds.  Psychiatric: She has a normal mood and affect. Her behavior is normal.  Nursing note and vitals reviewed.    Musculoskeletal Exam: C-spine and thoracic lumbar spine good range of motion. Shoulder joints elbow joints wrist joint MCPs PIPs DIPs with good range of motion. She is some thickening of PIP/DIP joints consistent with underlying osteoarthritis. No synovitis was noted. Hip joints knee joints ankles MTPs PIPs with good range of motion. With no synovitis  CDAI Exam: CDAI Homunculus Exam:   Joint Counts:  CDAI Tender Joint count: 0 CDAI Swollen Joint count: 0  Global Assessments:  Patient Global Assessment: 4 Provider Global Assessment: 4  CDAI Calculated Score: 8    Investigation: Findings:  .The last DEXA was done in July 2014 with a T-score of negative 3.3 01/11/2013 Since she has long-standing RA, we decided to get an x-ray of bilateral hands for comparison.   X-rays of bilateral hands, AP and oblique views, were obtained which showed bilateral DIP and PIP narrowing.  No MCP narrowing or erosive changes were noted.  No intercarpal joint space narrowing was noted.  There was no interval change from January 2014. 07/05/2016 CBC normal, CMP normal, TB gold results are not available    Imaging: No results found.  Speciality Comments: No specialty comments available.    Procedures:  No procedures performed Allergies: Morphine; Morphine and related; Codeine; Hydrocodone-acetaminophen; Sulfa antibiotics; and Sulfamethoxazole   Assessment / Plan:     Visit Diagnoses: Rheumatoid arthritis involving multiple sites with positive rheumatoid factor (HCC) - +CCP +RF . She has no synovitis on current exam.  High risk medication use - Humira 40 mg subcutaneous every week, Arava 10 mg by mouth daily. Her last labs December 2017 were normal I will advised her to get labs every 3 months which she is getting at [facility] with her PCP  Iritis of right eye - Right eye loss of vision. She is depressed about the recent loss of her corneal surgery.  ILD (interstitial lung disease) (HCC): She's been followed by Dr. Verdis Prime advised her to make a follow-up appointment although she's a symptomatic currently  Pulmonary fibrosis (HCC)  Spondylosis of lumbar region without myelopathy or radiculopathy: She continues to have some lower back pain she will follow-up with her back specialist.  Age-related osteoporosis without current pathological fracture - on Fosamax. Which she is tolerating well.  Her other medical problems are listed as follows:  History of migraine  History of seizures  History of glaucoma  History of macular degeneration    Orders: No orders of the defined types were placed in this encounter.  No orders of the defined types were placed in this encounter.   Face-to-face time spent with patient was 30 minutes. 50% of time was spent in  counseling and coordination of care.  Follow-Up Instructions: Return in about 5 months (around 02/10/2017) for Rheumatoid arthritis.   Pollyann Savoy, MD  Note - This record has been created using Animal nutritionist.  Chart creation errors have been sought, but may not always  have been located. Such creation errors do not reflect on  the standard of medical care.

## 2016-09-13 ENCOUNTER — Ambulatory Visit (INDEPENDENT_AMBULATORY_CARE_PROVIDER_SITE_OTHER): Payer: BC Managed Care – PPO | Admitting: Rheumatology

## 2016-09-13 ENCOUNTER — Encounter: Payer: Self-pay | Admitting: Rheumatology

## 2016-09-13 VITALS — BP 144/81 | HR 73 | Resp 13 | Ht 62.0 in | Wt 130.0 lb

## 2016-09-13 DIAGNOSIS — H209 Unspecified iridocyclitis: Secondary | ICD-10-CM | POA: Diagnosis not present

## 2016-09-13 DIAGNOSIS — M0579 Rheumatoid arthritis with rheumatoid factor of multiple sites without organ or systems involvement: Secondary | ICD-10-CM | POA: Diagnosis not present

## 2016-09-13 DIAGNOSIS — J8410 Pulmonary fibrosis, unspecified: Secondary | ICD-10-CM

## 2016-09-13 DIAGNOSIS — Z79899 Other long term (current) drug therapy: Secondary | ICD-10-CM

## 2016-09-13 DIAGNOSIS — J849 Interstitial pulmonary disease, unspecified: Secondary | ICD-10-CM | POA: Diagnosis not present

## 2016-09-13 DIAGNOSIS — M47816 Spondylosis without myelopathy or radiculopathy, lumbar region: Secondary | ICD-10-CM

## 2016-09-13 DIAGNOSIS — Z8669 Personal history of other diseases of the nervous system and sense organs: Secondary | ICD-10-CM | POA: Insufficient documentation

## 2016-09-13 DIAGNOSIS — M810 Age-related osteoporosis without current pathological fracture: Secondary | ICD-10-CM

## 2016-09-13 DIAGNOSIS — Z87898 Personal history of other specified conditions: Secondary | ICD-10-CM

## 2016-09-13 NOTE — Progress Notes (Signed)
Rheumatology Medication Review by a Pharmacist Does the patient feel that his/her medications are working for him/her?  Yes Has the patient been experiencing any side effects to the medications prescribed?  No Does the patient have any problems obtaining medications?  No  Issues to address at subsequent visits: None    Pharmacist comments:  Brianna [name] is a pleasant 66 yo F who presents for follow up.  She is currently taking Humira 40 mg every other week and leflunomide 20 mg daily.  These medications are prescribed by her ophthalmologist.  Her most recent standing labs were on 07/05/16.  She will be due for standing labs again in March 2018 and every 3 months.  TB Gold was also drawn on 07/05/16.  I am unable to see the results.  I called patient's ophthalmology office and requested the results of her TB test.  They will send the results for our records.  Patient denies any questions or concerns regarding her medications at this time.    Brianna [name], Pharm.D., BCPS, CPP Clinical Pharmacist Pager: [phone] Phone: [phone] 09/13/2016 10:44 AM

## 2016-09-13 NOTE — Patient Instructions (Signed)
Standing Labs We placed an order today for your standing lab work.    Please come back and get your standing labs in March and every 3 months  We have open lab Monday through Friday from 8:30-11:30 AM and 1:30-4 PM at the office of Dr. Arbutus Ped, PA.   The office is located at [address], [address], [place], [place] [address] No appointment is necessary.   Labs are drawn by [facility].  You may receive a bill from [facility] for your lab work.     Please reschedule appointment with your pulmonologist

## 2016-09-15 ENCOUNTER — Encounter: Payer: Self-pay | Admitting: Pharmacist

## 2016-09-15 NOTE — Progress Notes (Signed)
Received results of TB Gold from Dr. Margaretmary Eddy office.  Patient had TB Gold on 07/05/16 and 01/01/16.  Results were negative.  Labs were sent to scan center.     Lilla Shook, Pharm.D., BCPS, CPP Clinical Pharmacist Pager: [phone] Phone: [phone] 09/15/2016 2:09 PM

## 2016-09-23 ENCOUNTER — Other Ambulatory Visit: Payer: Self-pay | Admitting: Obstetrics and Gynecology

## 2016-09-23 DIAGNOSIS — N6311 Unspecified lump in the right breast, upper outer quadrant: Secondary | ICD-10-CM

## 2016-09-28 ENCOUNTER — Other Ambulatory Visit: Payer: BC Managed Care – PPO

## 2016-10-07 ENCOUNTER — Encounter: Payer: Self-pay | Admitting: Neurology

## 2016-10-07 ENCOUNTER — Ambulatory Visit (INDEPENDENT_AMBULATORY_CARE_PROVIDER_SITE_OTHER): Payer: BC Managed Care – PPO | Admitting: Neurology

## 2016-10-07 VITALS — Ht 62.0 in | Wt 128.4 lb

## 2016-10-07 DIAGNOSIS — G43719 Chronic migraine without aura, intractable, without status migrainosus: Secondary | ICD-10-CM

## 2016-10-07 MED ORDER — FROVATRIPTAN SUCCINATE 2.5 MG PO TABS: 2.5000 mg | ORAL_TABLET | ORAL | 11 refills | Status: DC | PRN

## 2016-10-07 NOTE — Progress Notes (Signed)
Botox 100 units/vial x 2 vials - buy and bill NDC [id number] Lot [id number] Exp 09 2020  Diluted in 4 ml of Bacteriostatic 0.9% NaCl NDC 6378-5885-02 Lot [id number] Exp [id number]

## 2016-10-07 NOTE — Progress Notes (Signed)

## 2016-10-11 ENCOUNTER — Ambulatory Visit
Admission: RE | Admit: 2016-10-11 | Discharge: 2016-10-11 | Disposition: A | Discharge status: Home / Self Care | Payer: BC Managed Care – PPO | Source: Ambulatory Visit | Attending: Obstetrics and Gynecology | Admitting: Obstetrics and Gynecology

## 2016-10-11 DIAGNOSIS — N6311 Unspecified lump in the right breast, upper outer quadrant: Secondary | ICD-10-CM

## 2016-12-17 HISTORY — PX: EYE SURGERY: SHX253

## 2017-01-03 ENCOUNTER — Other Ambulatory Visit: Payer: Self-pay | Admitting: Neurology

## 2017-01-12 ENCOUNTER — Ambulatory Visit (INDEPENDENT_AMBULATORY_CARE_PROVIDER_SITE_OTHER): Payer: BC Managed Care – PPO | Admitting: Neurology

## 2017-01-12 VITALS — BP 174/104 | HR 74 | Ht 62.0 in | Wt 127.0 lb

## 2017-01-12 DIAGNOSIS — G43719 Chronic migraine without aura, intractable, without status migrainosus: Secondary | ICD-10-CM | POA: Diagnosis not present

## 2017-01-12 NOTE — Progress Notes (Addendum)
Botox 100 units/vial from office supply (buy and bill) x 2 NDC 3299-2426-83 Lot [id number] Exp 08 2020  Diluted in 4 ml of Bacteriostatic 0.9% NaCl NDC 9798-9211-94 Lot [id number] Exp [id number]  Topical pain reliever applied to injection areas w/ gauze Lidocaine 5% cream NDC 17408-144-81 Lot [id number] Exp 06/19

## 2017-01-13 ENCOUNTER — Telehealth: Payer: Self-pay

## 2017-01-13 NOTE — Progress Notes (Signed)

## 2017-01-13 NOTE — Telephone Encounter (Signed)
[employer] service request form/rx completed, signed and faxed to [employer]. 

## 2017-01-31 DIAGNOSIS — H6123 Impacted cerumen, bilateral: Secondary | ICD-10-CM | POA: Insufficient documentation

## 2017-02-04 NOTE — Progress Notes (Signed)
Office Visit Note  [name]             Date of Birth: 1951/03/25           MRN: [id number]             PCP: Pearson Grippe, MD Referring: Darci Needle, MD Visit Date: 02/10/2017 Occupation: @GUAROCC @    Subjective:  Medication management   History of Present Illness: Brianna [name] is a 66 y.o. female with history of sero positive rheumatoid arthritis and interstitial lung disease. She states she is doing quite well on current combination of medication. She does have some stiffness in her hands feet and knee joints but denies any joint swelling. She does have some shortness of breath. She states she will make follow-up appointment with her pulmonologist. According to patient her right eye intraocular pressure has improved after the laser surgery she does have appointment coming up with the cornea specialist later this month. He does have some lower back pain with activities.  Activities of Daily Living:  Patient reports morning stiffness for 2 minutes.   Patient Denies nocturnal pain.  Difficulty dressing/grooming: Denies Difficulty climbing stairs: Denies Difficulty getting out of chair: Denies Difficulty using hands for taps, buttons, cutlery, and/or writing: Denies   Review of Systems  Constitutional: Negative for fatigue, night sweats, weight gain, weight loss and weakness.  HENT: Negative for mouth sores, trouble swallowing, trouble swallowing, mouth dryness and nose dryness.   Eyes: Positive for dryness. Negative for pain, redness and visual disturbance.  Respiratory: Positive for shortness of breath. Negative for cough and difficulty breathing.        Mild, history of ILD  Cardiovascular: Positive for hypertension. Negative for chest pain, palpitations, irregular heartbeat and swelling in legs/feet.  Gastrointestinal: Positive for constipation. Negative for blood in stool and diarrhea.  Endocrine: Negative for increased urination.  Genitourinary: Negative  for vaginal dryness.  Musculoskeletal: Positive for morning stiffness. Negative for arthralgias, joint pain, joint swelling, myalgias, muscle weakness, muscle tenderness and myalgias.  Skin: Negative for color change, rash, hair loss, skin tightness, ulcers and sensitivity to sunlight.  Allergic/Immunologic: Negative for susceptible to infections.  Neurological: Negative for dizziness, memory loss and night sweats.  Hematological: Negative for swollen glands.  Psychiatric/Behavioral: Positive for depressed mood. Negative for sleep disturbance. The patient is nervous/anxious.        Situational due to eye problems    PMFS History:  Patient Active Problem List   Diagnosis Date Noted  . History of macular degeneration 09/13/2016  . History of migraine 09/06/2016  . History of seizures 09/06/2016  . Iritis of right eye 05/12/2016  . Rheumatoid arthritis with positive rheumatoid factor (HCC) 05/11/2016  . ILD (interstitial lung disease) (HCC) 05/11/2016  . 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  . High risk medication use 05/11/2016  . Osteoporosis 05/11/2016  . 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 History of carotid endarterectomy 06/27/2014  . Migraine without aura 04/19/2013  . Seizures (HCC) 04/19/2013  . 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 Cough 01/14/2011  . Pulmonary fibrosis (HCC) 01/14/2011    Past Medical History:  Diagnosis Date  . Carotid artery occlusion   . Epilepsy (HCC)   . Glaucoma   . Hyperlipidemia   . Hypertension   . Macular degeneration   . Migraines   . Raynaud's disease   . Retinal edema   . Rheumatoid arthritis(714.0)     Family History  Problem Relation  Age of Onset  . Heart disease Mother   . Lung disease Mother        ? disease process  . Uterine cancer Mother   . Heart disease Father   . Clotting disorder Father   . Collagen disease Father   . Cervical cancer Maternal Aunt   . Prostate cancer  Maternal Grandfather   . Rheum arthritis Sister   . High Cholesterol Sister   . Epilepsy Sister   . Rheum arthritis Maternal Grandmother   . High Cholesterol Brother   . High blood pressure Brother   . High blood pressure Brother   . High Cholesterol Brother   . High blood pressure Brother   . High Cholesterol Brother    Past Surgical History:  Procedure Laterality Date  . CAROTID ARTERY ANGIOPLASTY  2008   Dr Madilyn Fireman  . CAROTID ENDARTERECTOMY Left 11-15-2007  . cataract extraction Right 04-2014   [facility]  . CHOLECYSTECTOMY  1995  . CORNEAL TRANSPLANT     x 5 ; steroid inj. retnal information  . CORNEAL TRANSPLANT Right 01-22-2014   [facility]  . EYE SURGERY     Social History   Social History Narrative   Patient is married to [name]), has 2 children   Patient is right handed   Education level is Bachelor's degree   Caffeine consumption is 2 cups daily     Objective: Vital Signs: BP (!) 143/84 (BP Location: Left Arm, Patient Position: Sitting, Cuff Size: Normal)   Pulse 75   Ht 5\' 3"  (1.6 m)   Wt 124 lb (56.2 kg)   BMI 21.97 kg/m    Physical Exam  Constitutional: She is oriented to person, place, and time. She appears well-developed and well-nourished.  HENT:  Head: Normocephalic and atraumatic.  Eyes: EOM are normal.  Neck: Normal range of motion.  Cardiovascular: Normal rate, regular rhythm, normal heart sounds and intact distal pulses.   Pulmonary/Chest: Effort normal and breath sounds normal.  Abdominal: Soft. Bowel sounds are normal.  Lymphadenopathy:    She has no cervical adenopathy.  Neurological: She is alert and oriented to person, place, and time.  Skin: Skin is warm and dry. Capillary refill takes less than 2 seconds.  Psychiatric: She has a normal mood and affect. Her behavior is normal.  Nursing note and vitals reviewed.    Musculoskeletal Exam: C-spine and thoracic lumbar spine good range of motion. Shoulder joints elbow joints wrist  joint MCPs PIPs DIPs with good range of motion. She is some synovial thickening over her MCP joints but no active synovitis was noted. Hip joints are good range of motion. She's discomfort range of motion of her knee joints without any warmth swelling or effusion. There was no synovitis over MCPs or PIP joints.  CDAI Exam: CDAI Homunculus Exam:   Joint Counts:  CDAI Tender Joint count: 0 CDAI Swollen Joint count: 0  Global Assessments:  Patient Global Assessment: 2 Provider Global Assessment: 2  CDAI Calculated Score: 4    Investigation: No additional findings.  Imaging: Xr Foot 2 Views Left  Result Date: 02/10/2017 First MTP all PIP/DIP narrowing. No erosive changes were noted. A small calcaneal spur was noted. Impression: Findings consistent with osteoarthritis.  Xr Foot 2 Views Right  Result Date: 02/10/2017 First MTP all PIP/DIP narrowing. No erosive changes were noted. A small calcaneal spur was noted. Impression: Findings consistent with osteoarthritis.  Xr Hand 2 View Left  Result Date: 02/10/2017 Minimal PIP/DIP narrowing was  noted. No MCP or intercarpal joint space narrowing was noted. No erosive changes were noted.  Xr Hand 2 View Right  Result Date: 02/10/2017 Minimal PIP/DIP narrowing was noted. No MCP or intercarpal joint space narrowing was noted. No erosive changes were noted.  Xr Knee 3 View Left  Result Date: 02/10/2017 Mild medial compartment narrowing. Mild chondromalacia patella. No chondrocalcinosis. Impression: Findings consistent with mild osteoarthritis of the knee joint  Xr Knee 3 View Right  Result Date: 02/10/2017 Mild medial compartment narrowing. Mild chondromalacia patella. No chondrocalcinosis. Impression: Findings consistent with mild osteoarthritis of the knee joint   Speciality Comments: No specialty comments available.    Procedures:  No procedures performed Allergies: Morphine; Morphine and related; Codeine;  Hydrocodone-acetaminophen; Sulfa antibiotics; and Sulfamethoxazole   Assessment / Plan:     Visit Diagnoses: Rheumatoid arthritis involving multiple sites with positive rheumatoid factor (HCC) - Positive RF, positive anti-CCP. She appears to be doing quite well on current combination of medication. I do not see any synovitis on examination. She does have some arthralgias. We will obtain following x-rays today.  High risk medication use - Humira 40 mg subcutaneous every week and Arava 10 mg by mouth daily - Plan: CBC with Differential/Platelet, COMPLETE METABOLIC PANEL WITH GFR, Quantiferon tb gold assay (blood), CBC with Differential/Platelet, COMPLETE METABOLIC PANEL WITH GFR. Labs will be done today and then every 3 months to monitor for drug toxicity.  Pain in both hands - Plan: XR Hand 2 View Right, XR Hand 2 View Left. X-rays were consistent with mild osteoarthritis only   Chronic pain of both knees - Plan: XR KNEE 3 VIEW RIGHT, XR KNEE 3 VIEW LEFT. X-rays revealed mild osteoarthritis and mouth are normal and patella only  Pain in both feet - Plan: XR Foot 2 Views Right, XR Foot 2 Views Left. X-rays revealed mild osteoarthritic changes.  ILD (interstitial lung disease) [facility]): Have advised her to make a follow-up appointment with Dr. Sherene Sires.  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 She's been closely followed up by corneal  DDD (degenerative disc disease), lumbar: Chronic pain related with activities only  Age-related osteoporosis without current pathological fracture - Prolia in the past/ currently on Fosamax  -3.3 T score distal radius left on 02/07/13. DEXA done by her GYN. - Plan: VITAMIN D 25 Hydroxy (Vit-D Deficiency, Fractures)  Her other medical problems are listed as follows:  History of carotid endarterectomy  History of macular degeneration  History of migraine  History of seizures  History of glaucoma     Orders: Orders Placed This Encounter  Procedures  . XR Hand 2 View Right  . XR Hand 2 View Left  . XR KNEE 3 VIEW RIGHT  . XR KNEE 3 VIEW LEFT  . XR Foot 2 Views Right  . XR Foot 2 Views Left  . CBC with Differential/Platelet  . COMPLETE METABOLIC PANEL WITH GFR  . Quantiferon tb gold assay (blood)  . CBC with Differential/Platelet  . COMPLETE METABOLIC PANEL WITH GFR  . VITAMIN D 25 Hydroxy (Vit-D Deficiency, Fractures)   No orders of the defined types were placed in this encounter.   Face-to-face time spent with patient was 30 minutes. 50% of time was spent in counseling and coordination of care.  Follow-Up Instructions: Return in about 5 months (around 07/13/2017) for Rheumatoid arthritis ILD DDD.   Pollyann Savoy, MD  Note - This record has been created using  Dragon software.  Chart creation errors have been sought, but may not always  have been located. Such creation errors do not reflect on  the standard of medical care. 

## 2017-02-10 ENCOUNTER — Ambulatory Visit (INDEPENDENT_AMBULATORY_CARE_PROVIDER_SITE_OTHER): Payer: Self-pay

## 2017-02-10 ENCOUNTER — Encounter: Payer: Self-pay | Admitting: Rheumatology

## 2017-02-10 ENCOUNTER — Ambulatory Visit (INDEPENDENT_AMBULATORY_CARE_PROVIDER_SITE_OTHER): Payer: BC Managed Care – PPO | Admitting: Rheumatology

## 2017-02-10 VITALS — BP 143/84 | HR 75 | Ht 63.0 in | Wt 124.0 lb

## 2017-02-10 DIAGNOSIS — Z9889 Other specified postprocedural states: Secondary | ICD-10-CM | POA: Diagnosis not present

## 2017-02-10 DIAGNOSIS — Z79899 Other long term (current) drug therapy: Secondary | ICD-10-CM

## 2017-02-10 DIAGNOSIS — M79671 Pain in right foot: Secondary | ICD-10-CM

## 2017-02-10 DIAGNOSIS — Z8669 Personal history of other diseases of the nervous system and sense organs: Secondary | ICD-10-CM | POA: Diagnosis not present

## 2017-02-10 DIAGNOSIS — M25561 Pain in right knee: Secondary | ICD-10-CM

## 2017-02-10 DIAGNOSIS — M25562 Pain in left knee: Secondary | ICD-10-CM | POA: Diagnosis not present

## 2017-02-10 DIAGNOSIS — M79641 Pain in right hand: Secondary | ICD-10-CM

## 2017-02-10 DIAGNOSIS — H209 Unspecified iridocyclitis: Secondary | ICD-10-CM | POA: Diagnosis not present

## 2017-02-10 DIAGNOSIS — G8929 Other chronic pain: Secondary | ICD-10-CM

## 2017-02-10 DIAGNOSIS — M5136 Other intervertebral disc degeneration, lumbar region: Secondary | ICD-10-CM | POA: Diagnosis not present

## 2017-02-10 DIAGNOSIS — J849 Interstitial pulmonary disease, unspecified: Secondary | ICD-10-CM | POA: Diagnosis not present

## 2017-02-10 DIAGNOSIS — J8410 Pulmonary fibrosis, unspecified: Secondary | ICD-10-CM

## 2017-02-10 DIAGNOSIS — Z87898 Personal history of other specified conditions: Secondary | ICD-10-CM

## 2017-02-10 DIAGNOSIS — M810 Age-related osteoporosis without current pathological fracture: Secondary | ICD-10-CM | POA: Diagnosis not present

## 2017-02-10 DIAGNOSIS — M0579 Rheumatoid arthritis with rheumatoid factor of multiple sites without organ or systems involvement: Secondary | ICD-10-CM

## 2017-02-10 DIAGNOSIS — M79672 Pain in left foot: Secondary | ICD-10-CM | POA: Diagnosis not present

## 2017-02-10 DIAGNOSIS — M51369 Other intervertebral disc degeneration, lumbar region without mention of lumbar back pain or lower extremity pain: Secondary | ICD-10-CM

## 2017-02-10 DIAGNOSIS — M79642 Pain in left hand: Secondary | ICD-10-CM

## 2017-02-10 LAB — COMPLETE METABOLIC PANEL WITH GFR
ALT: 21 U/L (ref 6–29)
AST: 31 U/L (ref 10–35)
Albumin: 4.4 g/dL (ref 3.6–5.1)
Alkaline Phosphatase: 110 U/L (ref 33–130)
BUN: 13 mg/dL (ref 7–25)
CO2: 21 mmol/L (ref 20–31)
Calcium: 9.5 mg/dL (ref 8.6–10.4)
Chloride: 105 mmol/L (ref 98–110)
Creat: 0.67 mg/dL (ref 0.50–0.99)
GFR, Est African American: >89 mL/min (ref >=60)
GFR, Est Non African American: >89 mL/min (ref >=60)
Glucose, Bld: 90 mg/dL (ref 65–99)
Potassium: 4.9 mmol/L (ref 3.5–5.3)
Sodium: 140 mmol/L (ref 135–146)
Total Bilirubin: 0.3 mg/dL (ref 0.2–1.2)
Total Protein: 7.6 g/dL (ref 6.1–8.1)

## 2017-02-10 LAB — CBC WITH DIFFERENTIAL/PLATELET
Basophils Absolute: 71 cells/uL (ref 0–200)
Basophils Relative: 1 %
Eosinophils Absolute: 781 cells/uL — ABNORMAL HIGH (ref 15–500)
Eosinophils Relative: 11 %
HCT: 42.5 % (ref 35.0–45.0)
Hemoglobin: 13.9 g/dL (ref 11.7–15.5)
Lymphocytes Relative: 21 %
Lymphs Abs: 1491 cells/uL (ref 850–3900)
MCH: 29.4 pg (ref 27.0–33.0)
MCHC: 32.7 g/dL (ref 32.0–36.0)
MCV: 89.9 fL (ref 80.0–100.0)
MPV: 11.3 fL (ref 7.5–12.5)
Monocytes Absolute: 923 cells/uL (ref 200–950)
Monocytes Relative: 13 %
Neutro Abs: 3834 cells/uL (ref 1500–7800)
Neutrophils Relative %: 54 %
Platelets: 287 10*3/uL (ref 140–400)
RBC: 4.73 MIL/uL (ref 3.80–5.10)
RDW: 13.5 % (ref 11.0–15.0)
WBC: 7.1 10*3/uL (ref 3.8–10.8)

## 2017-02-10 NOTE — Patient Instructions (Signed)
Standing Labs We placed an order today for your standing lab work.    Please come back and get your standing labs in October and every 3 months I don't  We have open lab Monday through Friday from 8:30-11:30 AM and 1:30-4 PM at the office of Dr. Pollyann Savoy.   The office is located at [address], [place], [place] [address] No appointment is necessary.   Labs are drawn by [employer] [facility].  You may receive a bill from [facility] for your lab work. If you have any questions regarding directions or hours of operation,  please call [phone].

## 2017-02-11 LAB — VITAMIN D 25 HYDROXY (VIT D DEFICIENCY, FRACTURES): Vit D, 25-Hydroxy: 46 ng/mL (ref 30–100)

## 2017-02-12 LAB — QUANTIFERON TB GOLD ASSAY (BLOOD)
Interferon Gamma Release Assay: NEGATIVE
Mitogen-Nil: >10 IU/mL
Quantiferon Nil Value: 0.04 IU/mL
Quantiferon Tb Ag Minus Nil Value: 0 IU/mL

## 2017-02-13 NOTE — Progress Notes (Signed)
WNL

## 2017-03-18 ENCOUNTER — Telehealth: Payer: Self-pay | Admitting: Neurology

## 2017-03-18 NOTE — Telephone Encounter (Signed)
Pt is having Glaucoma surgery and the Physician Assistant Sonya(at [facility]) would like to know if pt can come off the aspirin 1 week before her surgery (03-29-2017) please call her at [phone]

## 2017-03-21 NOTE — Telephone Encounter (Signed)
Belenda Cruise this is fine, would you call number and let them know? thanks

## 2017-03-22 NOTE — Telephone Encounter (Signed)
I called Sonya, PA back at [facility]. She needs a letter from Dr. Lucia Gaskins reflecting this information and the letter to be faxed to [phone].  I spoke with Dr. Lucia Gaskins, she is agreeable to this letter, letter penned and faxed to [facility]. Received a receipt of confirmation.

## 2017-03-30 ENCOUNTER — Telehealth: Payer: Self-pay | Admitting: *Deleted

## 2017-03-30 NOTE — Telephone Encounter (Signed)
Faxed completed/signed form back to [facility] for patient to stop aspirin 1 week prior to shunt eye procedure. Fax: [phone]. Received confirmation.

## 2017-04-07 ENCOUNTER — Other Ambulatory Visit: Payer: Self-pay | Admitting: *Deleted

## 2017-04-07 MED ORDER — ALENDRONATE SODIUM 70 MG PO TABS: 70.0000 mg | ORAL_TABLET | ORAL | 0 refills | Status: DC

## 2017-04-07 NOTE — Telephone Encounter (Signed)
Refill request received via fax  Last Visit: 02/10/17 Next Visit: 07/22/17 Labs: 02/10/17 WNL  Okay to refill per Dr. Corliss Skains

## 2017-04-08 ENCOUNTER — Institutional Professional Consult (permissible substitution): Payer: BC Managed Care – PPO | Admitting: Internal Medicine

## 2017-04-15 DIAGNOSIS — H401112 Primary open-angle glaucoma, right eye, moderate stage: Secondary | ICD-10-CM | POA: Insufficient documentation

## 2017-04-19 ENCOUNTER — Other Ambulatory Visit: Payer: Self-pay | Admitting: *Deleted

## 2017-04-19 ENCOUNTER — Encounter: Payer: Self-pay | Admitting: Neurology

## 2017-04-19 ENCOUNTER — Ambulatory Visit (INDEPENDENT_AMBULATORY_CARE_PROVIDER_SITE_OTHER): Payer: BC Managed Care – PPO | Admitting: Neurology

## 2017-04-19 VITALS — BP 194/99 | HR 74 | Ht 62.0 in | Wt 127.0 lb

## 2017-04-19 DIAGNOSIS — M7918 Myalgia, other site: Secondary | ICD-10-CM

## 2017-04-19 DIAGNOSIS — G43719 Chronic migraine without aura, intractable, without status migrainosus: Secondary | ICD-10-CM | POA: Diagnosis not present

## 2017-04-19 DIAGNOSIS — M542 Cervicalgia: Secondary | ICD-10-CM

## 2017-04-19 MED ORDER — KETOROLAC TROMETHAMINE 60 MG/2ML IM SOLN
60.0000 mg | Freq: Once | INTRAMUSCULAR | Status: AC
  Administered 2017-04-19: 60 mg via INTRAMUSCULAR

## 2017-04-19 NOTE — Progress Notes (Signed)
Botox-100unitsx2 vials Lot: [id number] Expiration: 10/2019 NDC: 8756-4332-95 [id number]  0.9% Sodium Chloride- 67mL total Lot: [id number] Expiration: 07/2018 NDC: 9323-5573-22  Dx: G25.427 Specialty pharmacy.  Ketorolac injection 60mg /2 mL given in pt's left deltoid per VO from Dr. . Lucia Gaskins F EXP:11/2017 12/2017 Pt tolerated well, NAD after injection.

## 2017-04-19 NOTE — Progress Notes (Signed)
Patient continues to have severe migraines, discussed Ajovy and administered the medication and provided one sample to patient, see documentation by nurse sandra youmg.  Orders Placed This Encounter  Procedures  . Ambulatory referral to Physical Therapy    Ajovy was administered in clinic [facility] 08/2018 by nurse Hermenia Fiscal, Side effects and possible complications were provided patient was given a sample with the same lot and expiration to take next month with written instructions. Patient will call us at the end of this month for beginning of the next month and she takes the next dose let us know if she's had any side effects she would like [place] to order Ajovyfor her.   Consent Form Botulism Toxin Injection For Chronic Migraine  Botulism toxin has been approved by the Federal drug administration for treatment of chronic migraine. Botulism toxin does not cure chronic migraine and it may not be effective in some patients.  The administration of botulism toxin is accomplished by injecting a small amount of toxin into the muscles of the neck and head. Dosage must be titrated for each individual. Any benefits resulting from botulism toxin tend to wear off after 3 months with a repeat injection required if benefit is to be maintained. Injections are usually done every 3-4 months with maximum effect peak achieved by about 2 or 3 weeks. Botulism toxin is expensive and you should be sure of what costs you will incur resulting from the injection.  The side effects of botulism toxin use for chronic migraine may include:   -Transient, and usually mild, facial weakness with facial injections  -Transient, and usually mild, head or neck weakness with head/neck injections  -Reduction or loss of forehead facial animation due to forehead muscle              weakness  -Eyelid drooping  -Dry eye  -Pain at the site of injection or bruising at the site of injection  -Double vision  -Potential unknown long  term risks  Contraindications: You should not have Botox if you are pregnant, nursing, allergic to albumin, have an infection, skin condition, or muscle weakness at the site of the injection, or have myasthenia gravis, Lambert-Eaton syndrome, or ALS.  It is also possible that as with any injection, there may be an allergic reaction or no effect from the medication. Reduced effectiveness after repeated injections is sometimes seen and rarely infection at the injection site may occur. All care will be taken to prevent these side effects. If therapy is given over a long time, atrophy and wasting in the muscle injected may occur. Occasionally the patient's become refractory to treatment because they develop antibodies to the toxin. In this event, therapy needs to be modified.  I have read the above information and consent to the administration of botulism toxin.    ______________  _____   _________________  Patient signature     Date   Witness signature       BOTOX PROCEDURE NOTE FOR MIGRAINE HEADACHE    Contraindications and precautions discussed with patient(above). Aseptic procedure was observed and patient tolerated procedure. Procedure performed by Dr. Artemio Aly  The condition has existed for more than 6 months, and pt does not have a diagnosis of ALS, Myasthenia Gravis or Lambert-Eaton Syndrome. Risks and benefits of injections discussed and pt agrees to proceed with the procedure. Written consent obtained  These injections are medically necessary. He receives good benefits from these injections. These injections do not cause sedations or  hallucinations which the oral therapies may cause.  Indication/Diagnosis: chronic migraine BOTOX(J0585) injection was performed according to protocol by [employer]. 200 units of BOTOX was dissolved into 4 cc NS.  NDC: [address]-67  Description of procedure:  The patient was placed in a sitting position. The standard protocol was used for Botox  as follows, with 5 units of Botox injected at each site:   -Procerus muscle, midline injection  -Corrugator muscle, bilateral injection  -Frontalis muscle, bilateral injection, with 2 sites each side, medial injection was performed in the upper one third of the frontalis muscle, in the region vertical from the medial inferior edge of the superior orbital rim. The lateral injection was again in the upper one third of the forehead vertically above the lateral limbus of the cornea, 1.5 cm lateral to the medial injection site.  -Temporalis muscle injection, 4 sites, bilaterally. The first injection was 3 cm above the tragus of the ear, second injection site was 1.5 cm to 3 cm up from the first injection site in line with the tragus of the ear. The third injection site was 1.5-3 cm forward between the first 2 injection sites. The fourth injection site was 1.5 cm posterior to the second injection site.  -Occipitalis muscle injection, 3 sites, bilaterally. The first injection was done one half way between the occipital protuberance and the tip of the mastoid process behind the ear. The second injection site was done lateral and superior to the first, 1 fingerbreadth from the first injection. The third injection site was 1 fingerbreadth superiorly and medially from the first injection site.  -Cervical paraspinal muscle injection, 2 sites, bilateral knee first injection site was 1 cm from the midline of the cervical spine, 3 cm inferior to the lower border of the occipital protuberance. The second injection site was 1.5 cm superiorly and laterally to the first injection site.  -Trapezius muscle injection was performed at 3 sites, bilaterally. The first injection site was in the upper trapezius muscle halfway between the inflection point of the neck, and the acromion. The second injection site was one half way between the acromion and the first injection site. The third injection was done between the first  injection site and the inflection point of the neck.   Will return for repeat injection in 3 months.   A 200 unit sof Botox was used, 155 units were injected, the rest of the Botox was wasted. The patient tolerated the procedure well, there were no complications of the above procedure.

## 2017-04-19 NOTE — Progress Notes (Signed)
Dr. Lucia Gaskins ordered Ajovy 1.61ml subq to R upper abdomen for migraine.  Pt tolerated well.  Bandaid applied.  LOT [id number]  Exp 2/20.  Another sample given to pt for home use.  Same lot and exp.

## 2017-04-21 MED ORDER — NONFORMULARY OR COMPOUNDED ITEM: 1.0000 "application " | 11 refills | Status: DC

## 2017-04-22 ENCOUNTER — Ambulatory Visit (INDEPENDENT_AMBULATORY_CARE_PROVIDER_SITE_OTHER): Payer: BC Managed Care – PPO | Admitting: Internal Medicine

## 2017-04-22 ENCOUNTER — Ambulatory Visit (INDEPENDENT_AMBULATORY_CARE_PROVIDER_SITE_OTHER)
Admission: RE | Admit: 2017-04-22 | Discharge: 2017-04-22 | Disposition: A | Discharge status: Home / Self Care | Payer: BC Managed Care – PPO | Source: Ambulatory Visit | Attending: Internal Medicine | Admitting: Internal Medicine

## 2017-04-22 ENCOUNTER — Encounter: Payer: Self-pay | Admitting: Internal Medicine

## 2017-04-22 VITALS — BP 118/80 | HR 75 | Ht 62.0 in | Wt 125.0 lb

## 2017-04-22 DIAGNOSIS — R05 Cough: Secondary | ICD-10-CM

## 2017-04-22 DIAGNOSIS — R059 Cough, unspecified: Secondary | ICD-10-CM

## 2017-04-22 DIAGNOSIS — J8410 Pulmonary fibrosis, unspecified: Secondary | ICD-10-CM

## 2017-04-22 MED ORDER — PANTOPRAZOLE SODIUM 40 MG PO TBEC: 40.0000 mg | DELAYED_RELEASE_TABLET | Freq: Every day | ORAL | 2 refills | Status: DC

## 2017-04-22 MED ORDER — PREDNISONE 10 MG PO TABS
ORAL_TABLET | ORAL | 0 refills | Status: DC
Start: 2017-04-22 — End: 2017-06-14

## 2017-04-22 MED ORDER — AMOXICILLIN-POT CLAVULANATE 875-125 MG PO TABS: 1.0000 | ORAL_TABLET | Freq: Two times a day (BID) | ORAL | 0 refills | Status: AC

## 2017-04-22 MED ORDER — FAMOTIDINE 20 MG PO TABS: ORAL_TABLET | ORAL | 2 refills | Status: DC

## 2017-04-22 NOTE — Progress Notes (Signed)
Subjective:     Patient ID: Brianna [name], female   DOB: 04/28/1951,     MRN: [id number]  HPI    Brief patient profile:  11 yowf quit smoking age 66 with no resp problems then @  36 bad pneumonia.  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   No sign doe but not that active.   Sleeping ok without nocturnal  or early am exac of resp c/o's or need for noct saba.  rec Ok to restart boniva the first of September and every month but if your respiratory or reflux symptoms worsen it's first medication I would stop and take in it's place Reclast IV yearly.    11/13/2013 f/u ov/Javione Gunawan re:  PF assoc with RA Chief Complaint  Patient presents with  . Followup with PFT    Pt states that her breathing is unchanged since her last visit. No new co's today.    can now do 1st to 2nd floor s sob and working out regularly s limits due to sob rec  Return in one year for pfts and cxr and call sooner if not decline in tolerance > did not return       04/22/2017  [facility] office visit/ Crescencio Jozwiak  / re-establish re RA assoc ILD  Chief Complaint  Patient presents with  . Pulmonary Consult    Self referral for cough x 3-4 months. She is coughing up white to yellow sputum.    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    No obvious day to day or daytime variability or assoc  mucus plugs or hemoptysis or cp or chest tightness, subjective wheeze or overt sinus or hb symptoms. No unusual exp hx or h/o childhood pna/ asthma or knowledge of premature birth.  Sleeping ok flat without nocturnal   exacerbation  of respiratory  c/o's or need for noct saba. Also denies any obvious fluctuation of symptoms with weather or environmental changes or other aggravating or alleviating factors except as outlined above   Current Allergies, Complete Past Medical History, Past Surgical History, Family History, and Social History were reviewed in [employer] record.   ROS  The following are not active complaints unless bolded Hoarseness, sore throat, dysphagia, dental problems, itching, sneezing,  nasal congestion or discharge of excess mucus or purulent secretions, ear ache,   fever, chills, sweats, unintended wt loss or wt gain, classically pleuritic or exertional cp,  orthopnea pnd or leg swelling, presyncope, palpitations, abdominal pain, anorexia, nausea, vomiting, diarrhea  or change in bowel habits or change in bladder habits, change in stools or change in  urine, dysuria, hematuria,  rash, arthralgias better vs baseline, visual complaints, headache, numbness, weakness or ataxia or problems with walking or coordination,  change in mood/affect or memory.        Current Meds  Medication Sig  . Adalimumab 40 MG/0.8ML PNKT Inject 40 mg into the skin.  Marland Kitchen alendronate (FOSAMAX) 70 MG tablet Take 1 tablet (70 mg total) by mouth once a week. Take with a full glass of water on an empty stomach.  . ALPHAGAN P 0.1 % SOLN Place 1 drop into the left eye.   Marland Kitchen amLODipine (NORVASC) 5 MG tablet Take 5 mg by mouth daily.    Marland Kitchen atorvastatin (LIPITOR) 40 MG tablet Take 40 mg by mouth daily.    . botulinum toxin Type A (BOTOX) 100 units SOLR injection  Inject IM into the head and neck muscles every 3 months by provider in office  . Bromfenac Sodium (BROMSITE) 0.075 % SOLN INSTILL 1 DROP IN BOTH EYES TWICE DAILY  . butalbital-acetaminophen-caffeine (FIORICET, ESGIC) 50-325-40 MG tablet Take 1 tablet by mouth every 6 (six) hours as needed for headache.  . Calcium Carbonate-Vit D-Min (CALCIUM 1200 PO) Take 2 capsules by mouth daily.    . Cholecalciferol (VITAMIN D PO) Take 1,000 Units by mouth daily.   . dorzolamide-timolol (COSOPT) 22.3-6.8 MG/ML ophthalmic solution Place 1 drop into both eyes 2 (two) times daily.   Marland Kitchen ezetimibe (ZETIA) 10 MG tablet Take 10 mg by mouth daily.    . frovatriptan (FROVA) 2.5 MG tablet Take 1 tablet (2.5 mg total) by mouth as needed for migraine. If recurs, may repeat after 2 hours. Max of 2 tabs in 24 hours.  . lansoprazole (PREVACID) 15 MG capsule Take 15 mg by mouth daily.    Marland Kitchen leflunomide (ARAVA) 10 MG tablet Take 20 mg by mouth daily.   . Lifitegrast (XIIDRA) 5 % SOLN INSTILL 1 DROP INTO BOTH EYES TWICE DAILY  . LORazepam (ATIVAN) 0.5 MG tablet Take 0.5 mg by mouth daily as needed for anxiety or sleep.   . NONFORMULARY OR COMPOUNDED ITEM Inject 1 application into the skin every 30 (thirty) days.  . phenytoin (DILANTIN) 300 MG ER capsule Take 1 capsule (300 mg total) by mouth daily.  . prednisoLONE acetate (PRED FORTE) 1 % ophthalmic suspension Place 1-4 drops into both eyes daily. 6 DROPS INTO R EYE-- ONE DROP INTO L EYE  . promethazine (PHENERGAN) 25 MG tablet Take 25 mg by mouth every 6 (six) hours as needed for nausea or vomiting.   . SUMAtriptan (IMITREX) 6 MG/0.5ML SOLN injection Inject 0.5 mLs (6 mg total) into the skin as needed for migraine.  . timolol (TIMOPTIC) 0.5 % ophthalmic solution Place 1 drop into the left eye every other day.   . topiramate (TOPAMAX) 100 MG tablet TAKE 1 TABLET(100 MG) BY MOUTH AT BEDTIME  . zolmitriptan (ZOMIG) 5 MG nasal solution Place 1 spray into the nose as needed for  migraine.           Review of Systems     Objective:   Physical Exam Amb wf nad  Wt 130 01/14/2011 > 08/27/2013  126 >  11/13/2013 125 > 04/22/2017  125   Vital signs reviewed:   - Note on arrival 02 sats  97% on RA    HEENT: nl dentition, turbinates, and oropharynx. Nl external ear canals without cough reflex   NECK :  without JVD/Nodes/TM/ nl carotid upstrokes bilaterally   LUNGS: no acc muscle use,  coarsening of bs bilaterally with no cough on inspiration or wheeze on exp  CV:  RRR  no s3 or murmur or increase in P2, no edema   ABD:  soft and nontender with nl excursion in the supine position. No bruits or organomegaly, bowel sounds nl  MS:  warm without deformities, calf tenderness, cyanosis or clubbing  SKIN: warm and dry without lesions    NEURO:  alert, approp, no deficits    CXR PA and Lateral:   04/22/2017 :    I personally reviewed images and agree with radiology impression as follows:    1. Basilar predominant fibrotic lung changes, progressed relative to 2016. 2. No other active cardiopulmonary disease. 3. Aortic atherosclerosis.  Assessment:

## 2017-04-22 NOTE — Patient Instructions (Addendum)
Augmentin 875 mg take one pill twice daily  X 10 days - take at breakfast and supper with large glass of water.  It would help reduce the usual side effects (diarrhea and yeast infections) if you ate cultured yogurt at lunch.   Prednisone 10 mg take  4 each am x 2 days,   2 each am x 2 days,  1 each am x 2 days and stop   Stop fosfamax for now   Pantoprazole (protonix) 40 mg   Take  30-60 min before first meal of the day and Pepcid (famotidine)  20 mg one @  bedtime until return to office - this is the best way to tell whether stomach acid is contributing to your problem.    GERD (REFLUX)  is an extremely common cause of respiratory symptoms just like yours , many times with no obvious heartburn at all.    It can be treated with medication, but also with lifestyle changes including elevation of the head of your bed (ideally with 6 inch  bed blocks),  Smoking cessation, avoidance of late meals, excessive alcohol, and avoid fatty foods, chocolate, peppermint, colas, red wine, and acidic juices such as orange juice.  NO MINT OR MENTHOL PRODUCTS SO NO COUGH DROPS   USE SUGARLESS CANDY INSTEAD (Jolley ranchers or Stover's or Life Savers) or even ice chips will also do - the key is to swallow to prevent all throat clearing. NO OIL BASED VITAMINS - use powdered substitutes.   Please remember to go to the  x-ray department downstairs in the basement  for your tests - we will call you with the results when they are available.       Please schedule a follow up office visit in 6 weeks, call sooner if needed  With pfts on return

## 2017-04-23 NOTE — Assessment & Plan Note (Signed)
-   Assoc with RA      - PFT's 06/15/2006 FEV1 (1.53)  Ratio 84 and DLC0 46 corrects to 90%     - PFT's  03/04/2011  FEV1 (1.73)  Ratio 83 and VC 2.11 and DLC0 63% corrects  94%     - PFT's 11/13/2013    VC 1.94 with DLCO 65 corrects to 102%     - 08/27/2013  Walked RA x 2 laps @ 185 ft each stopped due to  Oximeter stopped recording accurately but sats still 96% and no sob  cxr suggests progression but note she does fine on treadmill serially and suggested she continue this, monitor sats with exertion over time, and return for pfts  In meantime, Use of PPI is associated with improved survival time and with decreased radiologic fibrosis per King's study published in AJRCCM vol 184 p1390.  Dec 2011 and also may have other beneficial effects as per the latest review in [place] vol 193 p1345 Jun 20016.  This may not always be cause and effect, but given how universally unimpressive and expensive  all the other  Drugs developed to day  have been for pf,   rec start  Maintenance  ppi / diet/ lifestyle modification and f/u with serial walking sats and lung volumes for now to put more points on the curve / establish firm baseline before considering additional measures.

## 2017-04-23 NOTE — Assessment & Plan Note (Signed)
-   Sinus CT  01/14/2011 > neg  Most likely again has Upper airway cough syndrome (previously labeled PNDS),  is so named because it's frequently impossible to sort out how much is  CR/sinusitis with freq throat clearing (which can be related to primary GERD)   vs  causing  secondary (" extra esophageal")  GERD from wide swings in gastric pressure that occur with throat clearing, often  promoting self use of mint and menthol lozenges that reduce the lower esophageal sphincter tone and exacerbate the problem further in a cyclical fashion.   These are the same pts (now being labeled as having "irritable larynx syndrome" by some cough centers) who not infrequently have a history of having failed to tolerate ace inhibitors,  dry powder inhalers or biphosphonates or report having atypical/extraesophageal reflux symptoms that don't respond to standard doses of PPI  and are easily confused as having aecopd or asthma flares by even experienced allergists/ pulmonologists (myself included).    Would start by empirically treating for sinusitis x 10 days since notes discolored mucus each am  with such a benign exam  And very short course of pred to cover any inflammatory /allergy component to cough but try off biphosphonates until returns for pfts  and rx with max rx for gerd in meantime.   Total time devoted to counseling  > 50 % of initial 60 min office visit:  review case with pt/interim hx since last seen 3 y prior to OV   discussion of options/alternatives/ personally creating written customized instructions  in presence of pt  then going over those specific  Instructions directly with the pt including how to use all of the meds but in particular covering each new medication in detail and the difference between the maintenance= "automatic" meds and the prns using an action plan format for the latter (If this problem/symptom => do that organization reading Left to right).  Please see AVS from this visit for a full  list of these instructions which I personally wrote for this pt and  are unique to this visit.

## 2017-04-25 DIAGNOSIS — R52 Pain, unspecified: Secondary | ICD-10-CM | POA: Insufficient documentation

## 2017-04-25 DIAGNOSIS — R200 Anesthesia of skin: Secondary | ICD-10-CM | POA: Insufficient documentation

## 2017-04-25 NOTE — Progress Notes (Signed)
Spoke with pt and notified of results per Dr. Wert. Pt verbalized understanding and denied any questions. 

## 2017-05-05 ENCOUNTER — Ambulatory Visit: Payer: BC Managed Care – PPO | Admitting: Physical Therapy

## 2017-05-11 ENCOUNTER — Encounter: Payer: Self-pay | Admitting: Physical Therapy

## 2017-05-11 ENCOUNTER — Ambulatory Visit: Payer: BC Managed Care – PPO | Attending: Neurology | Admitting: Physical Therapy

## 2017-05-11 DIAGNOSIS — M542 Cervicalgia: Secondary | ICD-10-CM | POA: Diagnosis present

## 2017-05-11 DIAGNOSIS — R51 Headache: Secondary | ICD-10-CM | POA: Insufficient documentation

## 2017-05-11 DIAGNOSIS — G8929 Other chronic pain: Secondary | ICD-10-CM

## 2017-05-11 NOTE — Therapy (Signed)
[facility] [address] [place], [place], [address] Phone: [phone]   Fax:  [phone]  Physical Therapy Evaluation  Patient Details  Name: Brianna [name] MRN: [id number] Date of Birth: [birth year] Referring Provider: Anson Fret, MD  Encounter Date: 05/11/2017      PT End of Session - 05/11/17 0856    Visit Number 1   Number of Visits 13   Date for PT Re-Evaluation 06/24/17   Authorization Type BCBS   PT Start Time [phone]  pt arrived late   PT Stop Time 0941   PT Time Calculation (min) 45 min   Activity Tolerance Patient tolerated treatment well   Behavior During Therapy [facility] for tasks assessed/performed      Past Medical History:  Diagnosis Date  . Carotid artery occlusion   . Epilepsy (HCC)   . Glaucoma   . Hyperlipidemia   . Hypertension   . Macular degeneration   . Migraines   . Raynaud's disease   . Retinal edema   . Rheumatoid arthritis(714.0)     Past Surgical History:  Procedure Laterality Date  . CAROTID ARTERY ANGIOPLASTY  2008   Dr Madilyn Fireman  . CAROTID ENDARTERECTOMY Left 11-15-2007  . cataract extraction Right 04-2014   [facility]  . CHOLECYSTECTOMY  1995  . CORNEAL TRANSPLANT     x 5 ; steroid inj. retnal information  . CORNEAL TRANSPLANT Right 01-22-2014   [facility]  . EYE SURGERY      There were no vitals filed for this visit.       Subjective Assessment - 05/11/17 0900    Subjective Was being treated for lower back problems 1 year ago, had a procedure where needles were used to inject into my lower back. Was somewhat helpful. Reports her neck was diagnosed as profoundly arthritic. Was given options for procedures but did not want to persue due to eye problems. Feels pain mostly when flexed or looking down (able to hold about 5-10 min). Gardening is now difficult. Feels pain in bilateral upper traps, it just feels tight. Was sent for pain management.    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 Yes   Pain Score 5    Pain Location Neck   Pain Orientation Right;Left   Pain Descriptors / Indicators Tightness   Aggravating Factors  looking down   Pain Relieving Factors popping neck            [facility] PT Assessment - 05/11/17 0001      Assessment   Medical Diagnosis chronic migraine, neck pain   Referring Provider Anson Fret, MD   Onset Date/Surgical Date --  2 weeks ago   Hand Dominance Right   Next MD Visit --  3 months   Prior Therapy no     Precautions   Precaution Comments h/o seizures- triggers smells & auro     Restrictions   Weight Bearing Restrictions No     Balance Screen   Has the patient fallen in the past 6 months Yes   How many times? 1   Has the patient had a decrease in activity level because of a fear of falling?  Yes   Is the patient reluctant to leave their home because of a fear of falling?  No     Home Tourist information centre manager residence   Living Arrangements Spouse/significant other   Additional  Comments stairs at home     Prior Function   Vocation Requirements volunteer at [employer]   Overall Cognitive Status Within Functional Limits for tasks assessed     Observation/Other Assessments   Focus on Therapeutic Outcomes (FOTO)  52% limitation     Sensation   Additional Comments WFL     ROM / Strength   AROM / PROM / Strength AROM;Strength     AROM   AROM Assessment Site Cervical   Cervical Flexion 60   Cervical Extension 50   Cervical - Right Side Bend 30   Cervical - Left Side Bend 40   Cervical - Right Rotation 56   Cervical - Left Rotation 40     Strength   Strength Assessment Site Shoulder   Right/Left Shoulder Right;Left   Right Shoulder Flexion 4+/5   Right Shoulder ABduction 4/5   Right Shoulder External Rotation 4-/5   Right Shoulder Horizontal ABduction 4/5   Left Shoulder  Flexion 4+/5   Left Shoulder ABduction 4/5   Left Shoulder External Rotation 4-/5   Left Shoulder Horizontal ABduction 4/5            Objective measurements completed on examination: See above findings.          [facility] Adult PT Treatment/Exercise - 05/11/17 0001      Exercises   Exercises Shoulder     Shoulder Exercises: Seated   Theraband Level (Shoulder External Rotation) Level 2 (Red)     Shoulder Exercises: Standing   Theraband Level (Shoulder Row) Level 3 Chilton Si)                PT Education - 05/11/17 [phone]    Education provided Yes   Education Details anatomy of condition, POC, HEP, exercise form/rationale, FOTO   Person(s) Educated Patient   Methods Explanation;Demonstration;Tactile cues;Verbal cues;Handout   Comprehension Verbalized understanding;Returned demonstration;Verbal cues required;Tactile cues required;Need further instruction          PT Short Term Goals - 05/11/17 0947      PT SHORT TERM GOAL #1   Title Pt will verbalize ability to correct posture utilizing appropriate muscular contraction   Baseline began educating at eval   Time 3   Period Weeks   Status New   Target Date 06/03/17           PT Long Term Goals - 05/11/17 0947      PT LONG TERM GOAL #1   Title FOTO to 38% limitation to indicate significant improvement in functional ability   Baseline 52% limitation at eval   Time 6   Period Weeks   Status New   Target Date 06/24/17     PT LONG TERM GOAL #2   Title Pt will be able to brush her teeth without limitation by neck pain   Baseline reports severe pain at eval   Time 6   Period Weeks   Status New   Target Date 06/24/17     PT LONG TERM GOAL #3   Title Pt will be able to garden & complete volunteer activities, neck pain <=3/10   Baseline unable to look down 5-10 min to complete due to neck pain   Time 6   Period Weeks   Status New   Target Date 06/24/17     PT LONG TERM GOAL #4   Title gross  periscapular musculature strength 5/5 for proper support to biomechanical chain   Baseline see flowsheet  Time 6   Period Weeks   Status New   Target Date 06/24/17                Plan - 05/11/17 6767    Clinical Impression Statement pt presents to PT with complaints of chronic neck pain that worsened in the last two weeks. Associated migraines and upper back pain. Pt has good mobility of neck but notable internal rotation at shoulder and scapular winging. Weaness in periscapular region. Pt will benefit from skilled PT to decrease myofasical pain utilizing dry needling and improve postural alignment by strengthening periscapular musculature. Notable increase in postural sway in standing exercise.    History and Personal Factors relevant to plan of care: migraines, RA, HTN, carotid artery occlusion. h/o seizures   Clinical Presentation Evolving   Clinical Presentation due to: chronic pain that has worsened in last two weeks.    Clinical Decision Making Moderate   PT Frequency 2x / week   PT Duration 6 weeks   PT Treatment/Interventions ADLs/Self Care Home Management;Cryotherapy;Electrical Stimulation;Functional mobility training;Ultrasound;Traction;Moist Heat;Therapeutic activities;Therapeutic exercise;Balance training;Neuromuscular re-education;Patient/family education;Passive range of motion;Manual techniques;Dry needling;Taping   PT Next Visit Plan DN dervical-thoracic-periscapular regions   PT Home Exercise Plan rows, bilat GH[facility] ER   Consulted and Agree with Plan of Care Patient      Patient will benefit from skilled therapeutic intervention in order to improve the following deficits and impairments:  Impaired UE functional use, Increased muscle spasms, Decreased activity tolerance, Pain, Improper body mechanics, Decreased balance, Decreased strength, Postural dysfunction  Visit Diagnosis: Cervicalgia - Plan: PT plan of care cert/re-cert  Chronic nonintractable headache,  unspecified headache type - Plan: PT plan of care cert/re-cert     Problem List Patient Active Problem List   Diagnosis Date Noted  . History of macular degeneration 09/13/2016  . History of migraine 09/06/2016  . History of seizures 09/06/2016  . Iritis of right eye 05/12/2016  . Rheumatoid arthritis with positive rheumatoid factor (HCC) 05/11/2016  . ILD (interstitial lung disease) (HCC) 05/11/2016  . 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  . High risk medication use 05/11/2016  . Osteoporosis 05/11/2016  . 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 History of carotid endarterectomy 06/27/2014  . Migraine without aura 04/19/2013  . Seizures (HCC) 04/19/2013  . 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 Cough 01/14/2011  . Pulmonary fibrosis (HCC) 01/14/2011   Zelphia Glover C. Jayron Maqueda PT, DPT 05/11/17 9:54 AM   [facility] [address] [place], [place], [address] Phone: [phone]   Fax:  [phone]  Name: Brianna [name] MRN: [id number] Date of Birth: [birth year]

## 2017-05-18 ENCOUNTER — Ambulatory Visit: Payer: BC Managed Care – PPO | Admitting: Physical Therapy

## 2017-05-18 DIAGNOSIS — R519 Headache, unspecified: Secondary | ICD-10-CM

## 2017-05-18 DIAGNOSIS — M542 Cervicalgia: Secondary | ICD-10-CM

## 2017-05-18 DIAGNOSIS — R51 Headache: Secondary | ICD-10-CM

## 2017-05-18 DIAGNOSIS — G8929 Other chronic pain: Secondary | ICD-10-CM

## 2017-05-18 NOTE — Therapy (Signed)
[facility] [address] [place], [place], [address] Phone: [phone]   Fax:  [phone]  Physical Therapy Treatment  Patient Details  Name: Brianna [name] MRN: [id number] Date of Birth: 25-Apr-1951 Referring Provider: Anson Fret, MD  Encounter Date: 05/18/2017      PT End of Session - 05/18/17 0819    Visit Number 2   Number of Visits 13   Date for PT Re-Evaluation 06/24/17   Authorization Type BCBS   PT Start Time 0800   PT Stop Time 0852   PT Time Calculation (min) 52 min   Activity Tolerance Patient tolerated treatment well   Behavior During Therapy [facility] for tasks assessed/performed      Past Medical History:  Diagnosis Date  . Carotid artery occlusion   . Epilepsy (HCC)   . Glaucoma   . Hyperlipidemia   . Hypertension   . Macular degeneration   . Migraines   . Raynaud's disease   . Retinal edema   . Rheumatoid arthritis(714.0)     Past Surgical History:  Procedure Laterality Date  . CAROTID ARTERY ANGIOPLASTY  2008   Dr Madilyn Fireman  . CAROTID ENDARTERECTOMY Left 11-15-2007  . cataract extraction Right 04-2014   [facility]  . CHOLECYSTECTOMY  1995  . CORNEAL TRANSPLANT     x 5 ; steroid inj. retnal information  . CORNEAL TRANSPLANT Right 01-22-2014   [facility]  . EYE SURGERY      There were no vitals filed for this visit.      Subjective Assessment - 05/18/17 0804    Subjective I have had a migraine since yesterday morning on left side.  I have also pain in my neck and scapula on both sides. I want to try TPDN today. i have been having problems with pain while gardening and brushing my teeth and dusting ( how I hold my head)  I was looking at a high mounted TV on a hearth.  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 Yes   Pain Score 4    Pain Location Neck   Pain Orientation  Right;Left   Pain Descriptors / Indicators Tightness   Pain Score 5   Pain Location Head   Pain Orientation Left   Pain Descriptors / Indicators Tightness                         [facility] Adult PT Treatment/Exercise - 05/18/17 0822      Self-Care   Self-Care Other Self-Care Comments   Other Self-Care Comments  education on trigge point dry needling aftercare and precautians     Exercises   Exercises Neck     Modalities   Modalities Moist Heat     Moist Heat Therapy   Number Minutes Moist Heat 15 Minutes   Moist Heat Location Shoulder;Cervical  left shoulder     Manual Therapy   Manual Therapy Joint mobilization;Soft tissue mobilization   Joint Mobilization grade 2 lateral left PA on C-2 to C-5   Soft tissue mobilization soft tissue on left upper trap and levator, infraspinatus, sub scapularis medial and generall all periscapular mx     Neck Exercises: Stretches   Upper Trapezius Stretch 3 reps;30 seconds   Upper Trapezius Stretch Limitations right and left VC and TC   Levator Stretch 3 reps;30 seconds  Levator Stretch Limitations R and L 3 reps 30 seconds   Other Neck Stretches supine neck retraction with towel roll 5 x 10 sec   sitting up painful so supine only for now          Trigger Point Dry Needling - 05/18/17 0824    Consent Given? Yes   Education Handout Provided Yes   Muscles Treated Upper Body Upper trapezius;Levator scapulae;Subscapularis;Infraspinatus;Oblique capitus;Suboccipitals muscle group  left side only  C-2 to C-4 left only erector spinae   Upper Trapezius Response Twitch reponse elicited;Palpable increased muscle length   Oblique Capitus Response Palpable increased muscle length   SubOccipitals Response Palpable increased muscle length   Levator Scapulae Response Twitch response elicited;Palpable increased muscle length  marked twitch response   Infraspinatus Response Twitch response elicited;Palpable increased muscle length   marked twitch response   Subscapularis Response Twitch response elicited;Palpable increased muscle length              PT Education - 05/18/17 0818    Education provided Yes   Education Details Pt given neck stretches and education on trigger point dry needling   Person(s) Educated Patient   Methods Explanation;Demonstration;Tactile cues;Verbal cues;Handout   Comprehension Verbalized understanding;Returned demonstration          PT Short Term Goals - 05/11/17 0947      PT SHORT TERM GOAL #1   Title Pt will verbalize ability to correct posture utilizing appropriate muscular contraction   Baseline began educating at eval   Time 3   Period Weeks   Status New   Target Date 06/03/17           PT Long Term Goals - 05/11/17 0947      PT LONG TERM GOAL #1   Title FOTO to 38% limitation to indicate significant improvement in functional ability   Baseline 52% limitation at eval   Time 6   Period Weeks   Status New   Target Date 06/24/17     PT LONG TERM GOAL #2   Title Pt will be able to brush her teeth without limitation by neck pain   Baseline reports severe pain at eval   Time 6   Period Weeks   Status New   Target Date 06/24/17     PT LONG TERM GOAL #3   Title Pt will be able to garden & complete volunteer activities, neck pain <=3/10   Baseline unable to look down 5-10 min to complete due to neck pain   Time 6   Period Weeks   Status New   Target Date 06/24/17     PT LONG TERM GOAL #4   Title gross periscapular musculature strength 5/5 for proper support to biomechanical chain   Baseline see flowsheet   Time 6   Period Weeks   Status New   Target Date 06/24/17               Plan - 05/18/17 0913    Clinical Impression Statement Pt returns to clinic with migraine concentrated on left side.  Pt requested TPDN and was closely monitored throughout session.  Pt with palpable lengthening of left upper trap and levator and sub occipital mx.  Pt  also with marked twitch response on infraspinatus .  Pt educated on neck stretches and had discomfort with sitting neck retratction but performed better and pain free with supine and neck roll for support. Pt reported relief of tension post session  Will continue to assess  benefit of TPDN and exercises,   PT Frequency 2x / week   PT Duration 6 weeks   PT Treatment/Interventions ADLs/Self Care Home Management;Cryotherapy;Electrical Stimulation;Functional mobility training;Ultrasound;Traction;Moist Heat;Therapeutic activities;Therapeutic exercise;Balance training;Neuromuscular re-education;Patient/family education;Passive range of motion;Manual techniques;Dry needling;Taping   PT Next Visit Plan DN dervical-thoracic-periscapular regions assess   PT Home Exercise Plan rows, bilat GHJ ER, upper trap, levator and neck retraction   Consulted and Agree with Plan of Care Patient      Patient will benefit from skilled therapeutic intervention in order to improve the following deficits and impairments:  Impaired UE functional use, Increased muscle spasms, Decreased activity tolerance, Pain, Improper body mechanics, Decreased balance, Decreased strength, Postural dysfunction  Visit Diagnosis: Cervicalgia  Chronic nonintractable headache, unspecified headache type     Problem List Patient Active Problem List   Diagnosis Date Noted  . History of macular degeneration 09/13/2016  . History of migraine 09/06/2016  . History of seizures 09/06/2016  . Iritis of right eye 05/12/2016  . Rheumatoid arthritis with positive rheumatoid factor (HCC) 05/11/2016  . ILD (interstitial lung disease) (HCC) 05/11/2016  . 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  . High risk medication use 05/11/2016  . Osteoporosis 05/11/2016  . 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 History of carotid endarterectomy 06/27/2014  . Migraine without aura 04/19/2013  . Seizures (HCC)  04/19/2013  . 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 Cough 01/14/2011  . Pulmonary fibrosis (HCC) 01/14/2011    Garen Lah, PT Certified Exercise Expert for the Aging Adult  05/18/17 9:18 AM Phone: [phone] Fax: [phone]  [facility] [address] [place], [place], [address] Phone: [phone]   Fax:  [phone]  Name: SHALINDA BURKHOLDER MRN: [id number] Date of Birth: 1951/01/16

## 2017-05-18 NOTE — Patient Instructions (Addendum)
Trigger Point Dry Needling  . What is Trigger Point Dry Needling (DN)? o DN is a physical therapy technique used to treat muscle pain and dysfunction. Specifically, DN helps deactivate muscle trigger points (muscle knots).  o A thin filiform needle is used to penetrate the skin and stimulate the underlying trigger point. The goal is for a local twitch response (LTR) to occur and for the trigger point to relax. No medication of any kind is injected during the procedure.   . What Does Trigger Point Dry Needling Feel Like?  o The procedure feels different for each individual patient. Some patients report that they do not actually feel the needle enter the skin and overall the process is not painful. Very mild bleeding may occur. However, many patients feel a deep cramping in the muscle in which the needle was inserted. This is the local twitch response.   Marland Kitchen How Will I feel after the treatment? o Soreness is normal, and the onset of soreness may not occur for a few hours. Typically this soreness does not last longer than two days.  o Bruising is uncommon, however; ice can be used to decrease any possible bruising.  o In rare cases feeling tired or nauseous after the treatment is normal. In addition, your symptoms may get worse before they get better, this period will typically not last longer than 24 hours.   . What Can I do After My Treatment? o Increase your hydration by drinking more water for the next 24 hours. o You may place ice or heat on the areas treated that have become sore, however, do not use heat on inflamed or bruised areas. Heat often brings more relief post needling. o You can continue your regular activities, but vigorous activity is not recommended initially after the treatment for 24 hours. o DN is best combined with other physical therapy such as strengthening, stretching, and other therapies.     Garen Lah, PT Certified Exercise Expert for the Aging Adult  05/18/17  8:03 AM Phone: [phone] Fax: [phone]

## 2017-05-22 ENCOUNTER — Other Ambulatory Visit: Payer: Self-pay | Admitting: Neurology

## 2017-05-23 ENCOUNTER — Telehealth: Payer: Self-pay | Admitting: *Deleted

## 2017-05-23 NOTE — Telephone Encounter (Signed)
That's fine, I have never managed her dilantin

## 2017-05-23 NOTE — Telephone Encounter (Signed)
Received a refill request from pharmacy for Dilantin. I called patient, she has not been seen for an office visit in over 2 years by Dr. Lucia Gaskins, and that visit was not for Epilepsy. The patient states she is seeing her new internist this Thursday and has enough medication until then. I made her an appt with Dr. Lucia Gaskins Tuesday January 15th @ 09:30 arrival time 09:00. She verbalized understanding.

## 2017-05-25 ENCOUNTER — Ambulatory Visit: Payer: BC Managed Care – PPO | Attending: Neurology | Admitting: Physical Therapy

## 2017-05-25 ENCOUNTER — Encounter: Payer: Self-pay | Admitting: Physical Therapy

## 2017-05-25 DIAGNOSIS — M542 Cervicalgia: Secondary | ICD-10-CM | POA: Diagnosis present

## 2017-05-25 DIAGNOSIS — R51 Headache: Secondary | ICD-10-CM | POA: Insufficient documentation

## 2017-05-25 DIAGNOSIS — G8929 Other chronic pain: Secondary | ICD-10-CM

## 2017-05-25 NOTE — Therapy (Signed)
[facility] [address] [place], [place], [address] Phone: [phone]   Fax:  [phone]  Physical Therapy Treatment  Patient Details  Name: Brianna [name] MRN: [id number] Date of Birth: September 24, 1950 Referring Provider: Anson Fret, MD   Encounter Date: 05/25/2017  PT End of Session - 05/25/17 0805    Visit Number  3    Number of Visits  13    Date for PT Re-Evaluation  06/24/17    Authorization Type  BCBS    PT Start Time  0804    PT Stop Time  0844    PT Time Calculation (min)  40 min    Activity Tolerance  Patient tolerated treatment well    Behavior During Therapy  [facility] for tasks assessed/performed       Past Medical History:  Diagnosis Date  . Carotid artery occlusion   . Epilepsy (HCC)   . Glaucoma   . Hyperlipidemia   . Hypertension   . Macular degeneration   . Migraines   . Raynaud's disease   . Retinal edema   . Rheumatoid arthritis(714.0)     Past Surgical History:  Procedure Laterality Date  . CAROTID ARTERY ANGIOPLASTY  2008   Dr Madilyn Fireman  . CAROTID ENDARTERECTOMY Left 11-15-2007  . cataract extraction Right 04-2014   [facility]  . CHOLECYSTECTOMY  1995  . CORNEAL TRANSPLANT     x 5 ; steroid inj. retnal information  . CORNEAL TRANSPLANT Right 01-22-2014   [facility]  . EYE SURGERY      There were no vitals filed for this visit.  Subjective Assessment - 05/25/17 0805    Subjective  Pt reports needling was great. Got approval to go back to weight lifting so her upper traps are tight. No migraines.     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, looking down                      [facility] Adult PT Treatment/Exercise - 05/25/17 0001      Shoulder Exercises: ROM/Strengthening   UBE (Upper Arm Bike)  retro 4 min L1.5    Other ROM/Strengthening Exercises  machines: row, lat pull, chest press, fly    Other ROM/Strengthening Exercises   biceps & triceps curl             PT Education - 05/25/17 0845    Education provided  Yes    Education Details  gym machine use, DN    Person(s) Educated  Patient    Methods  Explanation;Demonstration;Tactile cues;Verbal cues    Comprehension  Verbalized understanding;Need further instruction;Returned demonstration;Verbal cues required;Tactile cues required       PT Short Term Goals - 05/11/17 0947      PT SHORT TERM GOAL #1   Title  Pt will verbalize ability to correct posture utilizing appropriate muscular contraction    Baseline  began educating at eval    Time  3    Period  Weeks    Status  New    Target Date  06/03/17        PT Long Term Goals - 05/11/17 0947      PT LONG TERM GOAL #1   Title  FOTO to 38% limitation to indicate significant improvement in functional ability    Baseline  52% limitation at eval    Time  6    Period  Weeks    Status  New    Target Date  06/24/17      PT LONG TERM GOAL #2   Title  Pt will be able to brush her teeth without limitation by neck pain    Baseline  reports severe pain at eval    Time  6    Period  Weeks    Status  New    Target Date  06/24/17      PT LONG TERM GOAL #3   Title  Pt will be able to garden & complete volunteer activities, neck pain <=3/10    Baseline  unable to look down 5-10 min to complete due to neck pain    Time  6    Period  Weeks    Status  New    Target Date  06/24/17      PT LONG TERM GOAL #4   Title  gross periscapular musculature strength 5/5 for proper support to biomechanical chain    Baseline  see flowsheet    Time  6    Period  Weeks    Status  New    Target Date  06/24/17            Plan - 05/25/17 0844    Clinical Impression Statement  Since pt did not have any HA or neck pain today we focused on her form with machines/weights at gym to reduce improper use of lower back and neck musculature. Discussed DN is as as needed treatment but if she can utilize proper exercise  techniques to maintain gains we shoud need it less or not at all any more.     PT Treatment/Interventions  ADLs/Self Care Home Management;Cryotherapy;Electrical Stimulation;Functional mobility training;Ultrasound;Traction;Moist Heat;Therapeutic activities;Therapeutic exercise;Balance training;Neuromuscular re-education;Patient/family education;Passive range of motion;Manual techniques;Dry needling;Taping    PT Next Visit Plan  DN PRN, how did machines go at gym?    PT Home Exercise Plan  rows, bilat GHJ ER, upper trap, levator and neck retraction    Consulted and Agree with Plan of Care  Patient       Patient will benefit from skilled therapeutic intervention in order to improve the following deficits and impairments:  Impaired UE functional use, Increased muscle spasms, Decreased activity tolerance, Pain, Improper body mechanics, Decreased balance, Decreased strength, Postural dysfunction  Visit Diagnosis: Cervicalgia  Chronic nonintractable headache, unspecified headache type     Problem List Patient Active Problem List   Diagnosis Date Noted  . History of macular degeneration 09/13/2016  . History of migraine 09/06/2016  . History of seizures 09/06/2016  . Iritis of right eye 05/12/2016  . Rheumatoid arthritis with positive rheumatoid factor (HCC) 05/11/2016  . ILD (interstitial lung disease) (HCC) 05/11/2016  . 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  . High risk medication use 05/11/2016  . Osteoporosis 05/11/2016  . 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 History of carotid endarterectomy 06/27/2014  . Migraine without aura 04/19/2013  . Seizures (HCC) 04/19/2013  . 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 Cough 01/14/2011  . Pulmonary fibrosis (HCC) 01/14/2011    [place] C. Ellawyn Wogan PT, DPT 05/25/17 8:46 AM   [facility] [address] [place], [place], [address] Phone: [phone]    Fax:  [phone]  Name: Brianna [name] MRN: [id number] Date of Birth: July 13, 1951

## 2017-05-27 ENCOUNTER — Encounter: Payer: Self-pay | Admitting: Physical Therapy

## 2017-05-27 ENCOUNTER — Ambulatory Visit: Payer: BC Managed Care – PPO | Admitting: Physical Therapy

## 2017-05-27 ENCOUNTER — Other Ambulatory Visit: Payer: Self-pay

## 2017-05-27 DIAGNOSIS — M542 Cervicalgia: Secondary | ICD-10-CM | POA: Diagnosis not present

## 2017-05-27 DIAGNOSIS — G8929 Other chronic pain: Secondary | ICD-10-CM

## 2017-05-27 DIAGNOSIS — R51 Headache: Secondary | ICD-10-CM

## 2017-05-27 NOTE — Therapy (Signed)
[facility] [address] [place], [place], [address] Phone: [phone]   Fax:  [phone]  Physical Therapy Treatment  Patient Details  Name: Brianna [name] MRN: [id number] Date of Birth: 29-Aug-1950 Referring Provider: Anson Fret, MD   Encounter Date: 05/27/2017  PT End of Session - 05/27/17 1124    Visit Number  4    Number of Visits  13    Date for PT Re-Evaluation  06/24/17    Authorization Type  BCBS    PT Start Time  0800    PT Stop Time  0845    PT Time Calculation (min)  45 min    Activity Tolerance  Patient tolerated treatment well    Behavior During Therapy  [facility] for tasks assessed/performed       Past Medical History:  Diagnosis Date  . Carotid artery occlusion   . Epilepsy (HCC)   . Glaucoma   . Hyperlipidemia   . Hypertension   . Macular degeneration   . Migraines   . Raynaud's disease   . Retinal edema   . Rheumatoid arthritis(714.0)     Past Surgical History:  Procedure Laterality Date  . CAROTID ARTERY ANGIOPLASTY  2008   Dr Madilyn Fireman  . CAROTID ENDARTERECTOMY Left 11-15-2007  . cataract extraction Right 04-2014   [facility]  . CHOLECYSTECTOMY  1995  . CORNEAL TRANSPLANT     x 5 ; steroid inj. retnal information  . CORNEAL TRANSPLANT Right 01-22-2014   [facility]  . EYE SURGERY      There were no vitals filed for this visit.  Subjective Assessment - 05/27/17 0808    Subjective  "Things are going pretty good." Pt. reports slight R shoulder and neck pain and occasional migraines with the last one occuring 2 days ago. Pt. states physican restrictions on gym activity were lifted last Friday and she has gone to the gym about 3 times since then with no significant pain.    Currently in Pain?  Yes    Pain Score  3     Pain Location  Neck    Pain Orientation  Right    Pain Descriptors / Indicators  Aching;Tightness        [facility] Adult PT Treatment/Exercise - 05/27/17 0823      Self-Care   Self-Care  Other Self-Care Comments    Other Self-Care Comments   pt education on thercane and 1st rib self mob      Manual Therapy   Manual Therapy  Soft tissue mobilization    Soft tissue mobilization  IASTM performed over R shoulder musculature; levator scapulae; rhomboids; infraspinatus; supraspinaturs  increased tightness and pain noted over R supraspinatus        PT Education - 05/27/17 1122    Education provided  Yes    Education Details  pt. recieved further information about DN, pt. educated about IASTM, education on proper posture and self assisted myofascial release techniques    Person(s) Educated  Patient    Methods  Explanation;Demonstration;Tactile cues;Verbal cues    Comprehension  Verbalized understanding;Returned demonstration       PT Short Term Goals - 05/11/17 0947      PT SHORT TERM GOAL #1   Title  Pt will verbalize ability to correct posture utilizing appropriate muscular contraction    Baseline  began educating at eval    Time  3    Period  Weeks    Status  New  Target Date  06/03/17       PT Long Term Goals - 05/11/17 0947      PT LONG TERM GOAL #1   Title  FOTO to 38% limitation to indicate significant improvement in functional ability    Baseline  52% limitation at eval    Time  6    Period  Weeks    Status  New    Target Date  06/24/17      PT LONG TERM GOAL #2   Title  Pt will be able to brush her teeth without limitation by neck pain    Baseline  reports severe pain at eval    Time  6    Period  Weeks    Status  New    Target Date  06/24/17      PT LONG TERM GOAL #3   Title  Pt will be able to garden & complete volunteer activities, neck pain <=3/10    Baseline  unable to look down 5-10 min to complete due to neck pain    Time  6    Period  Weeks    Status  New    Target Date  06/24/17      PT LONG TERM GOAL #4   Title  gross periscapular musculature strength 5/5 for proper support to biomechanical chain     Baseline  see flowsheet    Time  6    Period  Weeks    Status  New    Target Date  06/24/17       Plan - 05/27/17 1126    Clinical Impression Statement  Pt. presents today with slight R neck and shoulder pain. Pt. inquired about DN but was not performed during the session. Pt. recieved education on updated HEP to address pain in shoulder and neck. Pt. was TTP and had significant tightness noted over R supraspinatus and recieved IASTM. Pt. noted decreased pain post-session.     PT Treatment/Interventions  ADLs/Self Care Home Management;Cryotherapy;Electrical Stimulation;Functional mobility training;Ultrasound;Traction;Moist Heat;Therapeutic activities;Therapeutic exercise;Balance training;Neuromuscular re-education;Patient/family education;Passive range of motion;Manual techniques;Dry needling;Taping    PT Next Visit Plan  DN PRN, how did machines go at gym?    PT Home Exercise Plan  rows, bilat GHJ ER, upper trap, levator and neck retraction    Consulted and Agree with Plan of Care  Patient       Patient will benefit from skilled therapeutic intervention in order to improve the following deficits and impairments:  Impaired UE functional use, Increased muscle spasms, Decreased activity tolerance, Pain, Improper body mechanics, Decreased balance, Decreased strength, Postural dysfunction  Visit Diagnosis: Cervicalgia  Chronic nonintractable headache, unspecified headache type    Problem List Patient Active Problem List   Diagnosis Date Noted  . History of macular degeneration 09/13/2016  . History of migraine 09/06/2016  . History of seizures 09/06/2016  . Iritis of right eye 05/12/2016  . Rheumatoid arthritis with positive rheumatoid factor (HCC) 05/11/2016  . ILD (interstitial lung disease) (HCC) 05/11/2016  . 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  . High risk medication use 05/11/2016  . Osteoporosis 05/11/2016  . Intractable chronic migraine without aura and without status  migrainosus 04/02/2016  . History of carotid endarterectomy 06/27/2014  . Migraine without aura 04/19/2013  . Seizures (HCC) 04/19/2013  . 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 Cough 01/14/2011  . Pulmonary fibrosis (HCC) 01/14/2011     Marlia Schewe, SPT 05/27/17 11:41 AM   [facility]  [address] [place], [place], [address] Phone: [phone]   Fax:  [phone]  Name: Brianna [name] MRN: [id number] Date of Birth: 06-Aug-1950

## 2017-06-02 ENCOUNTER — Ambulatory Visit: Payer: BC Managed Care – PPO | Admitting: Physical Therapy

## 2017-06-02 ENCOUNTER — Encounter: Payer: Self-pay | Admitting: Physical Therapy

## 2017-06-02 DIAGNOSIS — R51 Headache: Secondary | ICD-10-CM

## 2017-06-02 DIAGNOSIS — M542 Cervicalgia: Secondary | ICD-10-CM | POA: Diagnosis not present

## 2017-06-02 DIAGNOSIS — G8929 Other chronic pain: Secondary | ICD-10-CM

## 2017-06-02 NOTE — Therapy (Signed)
[facility] Rehabilitation [facility] [address] [place], [place], [address] Phone: [phone]   Fax:  [phone]  Physical Therapy Treatment  Patient Details  Name: Brianna [name] MRN: [id number] Date of Birth: Nov 13, 1950 Referring Provider: Anson Fret, MD   Encounter Date: 06/02/2017  PT End of Session - 06/02/17 0803    Visit Number  5    Number of Visits  13    Date for PT Re-Evaluation  06/24/17    Authorization Type  BCBS    PT Start Time  0802    PT Stop Time  0844    PT Time Calculation (min)  42 min    Activity Tolerance  Patient tolerated treatment well    Behavior During Therapy  [facility] for tasks assessed/performed       Past Medical History:  Diagnosis Date  . Carotid artery occlusion   . Epilepsy (HCC)   . Glaucoma   . Hyperlipidemia   . Hypertension   . Macular degeneration   . Migraines   . Raynaud's disease   . Retinal edema   . Rheumatoid arthritis(714.0)     Past Surgical History:  Procedure Laterality Date  . CAROTID ARTERY ANGIOPLASTY  2008   Dr Madilyn Fireman  . CAROTID ENDARTERECTOMY Left 11-15-2007  . cataract extraction Right 04-2014   [facility]  . CHOLECYSTECTOMY  1995  . CORNEAL TRANSPLANT     x 5 ; steroid inj. retnal information  . CORNEAL TRANSPLANT Right 01-22-2014   [facility]  . EYE SURGERY      There were no vitals filed for this visit.  Subjective Assessment - 06/02/17 0803    Subjective  Pt reports neck feels tight on the right side but has not done any stretching this morning. Weight lifting is going pretty well but feels like placement was off when rowing. Feels pain in shoulders when on treadmill but does not want to move arms (wants to hold on) due to stability, afraid of eliptical due to fear of hip strain from low density.                       [facility] Adult PT Treatment/Exercise - 06/02/17 0001      Neck Exercises: Machines for Strengthening   UBE (Upper Arm Bike)  retro  4 min L1    Cybex Row  20lb    Cybex Chest Press  20 lb    Other Machines for Strengthening  nu step UE & LE      Shoulder Exercises: Standing   Other Standing Exercises  3# biceps & triceps      Neck Exercises: Stretches   Upper Trapezius Stretch  30 seconds    Levator Stretch  30 seconds             PT Education - 06/02/17 0845    Education provided  Yes    Education Details  posture management, breathing with exercises, set up of machinery. POC    Person(s) Educated  Patient    Methods  Explanation;Demonstration;Tactile cues;Verbal cues    Comprehension  Verbalized understanding;Need further instruction;Returned demonstration;Verbal cues required;Tactile cues required       PT Short Term Goals - 05/11/17 0947      PT SHORT TERM GOAL #1   Title  Pt will verbalize ability to correct posture utilizing appropriate muscular contraction    Baseline  began educating at eval    Time  3  Period  Weeks    Status  New    Target Date  06/03/17        PT Long Term Goals - 05/11/17 0947      PT LONG TERM GOAL #1   Title  FOTO to 38% limitation to indicate significant improvement in functional ability    Baseline  52% limitation at eval    Time  6    Period  Weeks    Status  New    Target Date  06/24/17      PT LONG TERM GOAL #2   Title  Pt will be able to brush her teeth without limitation by neck pain    Baseline  reports severe pain at eval    Time  6    Period  Weeks    Status  New    Target Date  06/24/17      PT LONG TERM GOAL #3   Title  Pt will be able to garden & complete volunteer activities, neck pain <=3/10    Baseline  unable to look down 5-10 min to complete due to neck pain    Time  6    Period  Weeks    Status  New    Target Date  06/24/17      PT LONG TERM GOAL #4   Title  gross periscapular musculature strength 5/5 for proper support to biomechanical chain    Baseline  see flowsheet    Time  6    Period  Weeks    Status  New    Target  Date  06/24/17            Plan - 06/02/17 0844    Clinical Impression Statement  Reviewed more gym machinery today for form and reviewed stretches. Reports shoulder felt better than when she came in. Will DN at next visit if appropriate    PT Treatment/Interventions  ADLs/Self Care Home Management;Cryotherapy;Electrical Stimulation;Functional mobility training;Ultrasound;Traction;Moist Heat;Therapeutic activities;Therapeutic exercise;Balance training;Neuromuscular re-education;Patient/family education;Passive range of motion;Manual techniques;Dry needling;Taping    PT Next Visit Plan  DN PRN, prone periscapular exercises    PT Home Exercise Plan  rows, bilat GHJ ER, upper trap, levator and neck retraction    Consulted and Agree with Plan of Care  Patient       Patient will benefit from skilled therapeutic intervention in order to improve the following deficits and impairments:  Impaired UE functional use, Increased muscle spasms, Decreased activity tolerance, Pain, Improper body mechanics, Decreased balance, Decreased strength, Postural dysfunction  Visit Diagnosis: Cervicalgia  Chronic nonintractable headache, unspecified headache type     Problem List Patient Active Problem List   Diagnosis Date Noted  . History of macular degeneration 09/13/2016  . History of migraine 09/06/2016  . History of seizures 09/06/2016  . Iritis of right eye 05/12/2016  . Rheumatoid arthritis with positive rheumatoid factor (HCC) 05/11/2016  . ILD (interstitial lung disease) (HCC) 05/11/2016  . 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  . High risk medication use 05/11/2016  . Osteoporosis 05/11/2016  . 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 History of carotid endarterectomy 06/27/2014  . Migraine without aura 04/19/2013  . Seizures (HCC) 04/19/2013  . 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 Cough 01/14/2011  . Pulmonary fibrosis (HCC) 01/14/2011  Brianna [name]  C. Karthika Glasper PT, DPT 06/02/17 8:47 AM   [facility] [address] [place], [place], [address] Phone: [phone]   Fax:  [phone]  Name: Brianna [name] MRN: [id number] Date of Birth: Apr 09, 1951

## 2017-06-03 ENCOUNTER — Encounter: Payer: Self-pay | Admitting: Physical Therapy

## 2017-06-03 ENCOUNTER — Ambulatory Visit: Payer: BC Managed Care – PPO | Admitting: Physical Therapy

## 2017-06-03 DIAGNOSIS — R51 Headache: Secondary | ICD-10-CM

## 2017-06-03 DIAGNOSIS — M542 Cervicalgia: Secondary | ICD-10-CM | POA: Diagnosis not present

## 2017-06-03 DIAGNOSIS — G8929 Other chronic pain: Secondary | ICD-10-CM

## 2017-06-03 NOTE — Therapy (Signed)
[facility] Outpatient Rehabilitation [facility] [address] [place], [place], [address] Phone: [phone]   Fax:  [phone]  Physical Therapy Treatment  Patient Details  Name: Brianna [name] MRN: [id number] Date of Birth: September 10, 1950 Referring Provider: Anson Fret, MD   Encounter Date: 06/03/2017  PT End of Session - 06/03/17 0837    Visit Number  6    Number of Visits  13    Date for PT Re-Evaluation  06/24/17    Authorization Type  BCBS    PT Start Time  0800    PT Stop Time  0847    PT Time Calculation (min)  47 min    Activity Tolerance  Patient tolerated treatment well    Behavior During Therapy  [facility] for tasks assessed/performed       Past Medical History:  Diagnosis Date  . Carotid artery occlusion   . Epilepsy (HCC)   . Glaucoma   . Hyperlipidemia   . Hypertension   . Macular degeneration   . Migraines   . Raynaud's disease   . Retinal edema   . Rheumatoid arthritis(714.0)     Past Surgical History:  Procedure Laterality Date  . CAROTID ARTERY ANGIOPLASTY  2008   Dr Madilyn Fireman  . CAROTID ENDARTERECTOMY Left 11-15-2007  . cataract extraction Right 04-2014   [facility]  . CHOLECYSTECTOMY  1995  . CORNEAL TRANSPLANT     x 5 ; steroid inj. retnal information  . CORNEAL TRANSPLANT Right 01-22-2014   [facility]  . EYE SURGERY      There were no vitals filed for this visit.  Subjective Assessment - 06/03/17 0838    Subjective  Severe migraine yesterday but her neck felt okay and she was able to do exercises. Bothered by lights today. 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, looking down                      [facility] Adult PT Treatment/Exercise - 06/03/17 0001      Moist Heat Therapy   Number Minutes Moist Heat  10 Minutes    Moist Heat Location  Shoulder;Cervical      Manual Therapy   Joint  Mobilization  gr 4 first rib depression    Soft tissue mobilization  IASTM & manual soft tissue mob to L upper trap, levator       Trigger Point Dry Needling - 06/03/17 0841    Upper Trapezius Response  Twitch reponse elicited;Palpable increased muscle length    Levator Scapulae Response  Twitch response elicited;Palpable increased muscle length           PT Education - 06/03/17 0840    Education provided  Yes    Education Details  TPDN and expected outcomes, freuquent stretching/mobility, drink water    Person(s) Educated  Patient    Methods  Explanation    Comprehension  Verbalized understanding;Need further instruction       PT Short Term Goals - 05/11/17 0947      PT SHORT TERM GOAL #1   Title  Pt will verbalize ability to correct posture utilizing appropriate muscular contraction    Baseline  began educating at eval    Time  3    Period  Weeks    Status  New    Target Date  06/03/17        PT  Long Term Goals - 05/11/17 0947      PT LONG TERM GOAL #1   Title  FOTO to 38% limitation to indicate significant improvement in functional ability    Baseline  52% limitation at eval    Time  6    Period  Weeks    Status  New    Target Date  06/24/17      PT LONG TERM GOAL #2   Title  Pt will be able to brush her teeth without limitation by neck pain    Baseline  reports severe pain at eval    Time  6    Period  Weeks    Status  New    Target Date  06/24/17      PT LONG TERM GOAL #3   Title  Pt will be able to garden & complete volunteer activities, neck pain <=3/10    Baseline  unable to look down 5-10 min to complete due to neck pain    Time  6    Period  Weeks    Status  New    Target Date  06/24/17      PT LONG TERM GOAL #4   Title  gross periscapular musculature strength 5/5 for proper support to biomechanical chain    Baseline  see flowsheet    Time  6    Period  Weeks    Status  New    Target Date  06/24/17            Plan - 06/03/17 0839     Clinical Impression Statement  Utilized DN and manual treatment today in a room where natural light was available due to sensitivity to light today. pt reported feeling better following manual therapy. Educated on importance of frequent movement today as well as drinking water.     PT Treatment/Interventions  ADLs/Self Care Home Management;Cryotherapy;Electrical Stimulation;Functional mobility training;Ultrasound;Traction;Moist Heat;Therapeutic activities;Therapeutic exercise;Balance training;Neuromuscular re-education;Patient/family education;Passive range of motion;Manual techniques;Dry needling;Taping    PT Next Visit Plan  DN outcomes?  prone periscapular exercises    PT Home Exercise Plan  rows, bilat GHJ ER, upper trap, levator and neck retraction       Patient will benefit from skilled therapeutic intervention in order to improve the following deficits and impairments:  Impaired UE functional use, Increased muscle spasms, Decreased activity tolerance, Pain, Improper body mechanics, Decreased balance, Decreased strength, Postural dysfunction  Visit Diagnosis: Cervicalgia  Chronic nonintractable headache, unspecified headache type     Problem List Patient Active Problem List   Diagnosis Date Noted  . History of macular degeneration 09/13/2016  . History of migraine 09/06/2016  . History of seizures 09/06/2016  . Iritis of right eye 05/12/2016  . Rheumatoid arthritis with positive rheumatoid factor (HCC) 05/11/2016  . ILD (interstitial lung disease) (HCC) 05/11/2016  . 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  . High risk medication use 05/11/2016  . Osteoporosis 05/11/2016  . 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 History of carotid endarterectomy 06/27/2014  . Migraine without aura 04/19/2013  . Seizures (HCC) 04/19/2013  . 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 Cough 01/14/2011  . Pulmonary fibrosis (HCC) 01/14/2011    Brianna [name] C.  Brianna [name] PT, DPT 06/03/17 8:43 AM   [facility] [address] [place], [place], [address] Phone: [phone]   Fax:  [phone]  Name: Brianna [name] MRN: [id number] Date of Birth: [birth year]

## 2017-06-14 ENCOUNTER — Ambulatory Visit (INDEPENDENT_AMBULATORY_CARE_PROVIDER_SITE_OTHER): Payer: BC Managed Care – PPO | Admitting: Internal Medicine

## 2017-06-14 ENCOUNTER — Encounter: Payer: Self-pay | Admitting: Internal Medicine

## 2017-06-14 VITALS — BP 120/76 | HR 75 | Ht 61.0 in | Wt 127.0 lb

## 2017-06-14 DIAGNOSIS — R059 Cough, unspecified: Secondary | ICD-10-CM

## 2017-06-14 DIAGNOSIS — R05 Cough: Secondary | ICD-10-CM | POA: Diagnosis not present

## 2017-06-14 DIAGNOSIS — J8410 Pulmonary fibrosis, unspecified: Secondary | ICD-10-CM

## 2017-06-14 LAB — PULMONARY FUNCTION TEST
DL/VA % pred: 97 %
DL/VA: 4.27 ml/min/mmHg/L
DLCO unc % pred: 59 %
DLCO unc: 11.98 ml/min/mmHg
FEF 25-75 Post: 1.72 L/sec
FEF 25-75 Pre: 1.41 L/sec
FEF2575-%Change-Post: 22 %
FEF2575-%Pred-Post: 87 %
FEF2575-%Pred-Pre: 70 %
FEV1-%Change-Post: 5 %
FEV1-%Pred-Post: 68 %
FEV1-%Pred-Pre: 65 %
FEV1-Post: 1.47 L
FEV1-Pre: 1.4 L
FEV1FVC-%Change-Post: 5 %
FEV1FVC-%Pred-Pre: 106 %
FEV6-%Change-Post: 0 %
FEV6-%Pred-Post: 63 %
FEV6-%Pred-Pre: 63 %
FEV6-Post: 1.71 L
FEV6-Pre: 1.71 L
FEV6FVC-%Pred-Post: 104 %
FEV6FVC-%Pred-Pre: 104 %
FVC-%Change-Post: 0 %
FVC-%Pred-Post: 60 %
FVC-%Pred-Pre: 60 %
FVC-Post: 1.71 L
FVC-Pre: 1.71 L
Post FEV1/FVC ratio: 86 %
Post FEV6/FVC ratio: 100 %
Pre FEV1/FVC ratio: 82 %
Pre FEV6/FVC Ratio: 100 %
RV % pred: 54 %
RV: 1.05 L
TLC % pred: 60 %
TLC: 2.79 L

## 2017-06-14 NOTE — Progress Notes (Signed)
Subjective:     Patient ID: Brianna [name], female   DOB: 1950/11/05,     MRN: [id number]       Brief patient profile:  66 yowf quit smoking age 66 with no resp problems then @  36 bad pneumonia.  Seen in pulmonary clinic originally in 66 with evidence of ILD/ nodular change c/w RA  With mild/mod restrictive changes on pfts since 05/2006      History of Present Illness  01/14/2011  Initial pulmonary office eval in EMR era cc  chest congestion worse in am's with minimal white mucus seems better in afternoon and flares when lie down at hs - first noted with sinus infection rx with steroid shot and then abx improved some.   No sign doe but not that active.   Sleeping ok without nocturnal  or early am exac of resp c/o's or need for noct saba.  rec Ok to restart boniva the first of September and every month but if your respiratory or reflux symptoms worsen it's first medication I would stop and take in it's place Reclast IV yearly.    11/13/2013 f/u ov/Brianna [name] re:  PF assoc with RA Chief Complaint  Patient presents with  . Followup with PFT    Pt states that her breathing is unchanged since her last visit. No new co's today.    can now do 1st to 2nd floor s sob and working out regularly s limits due to sob rec  Return in one year for pfts and cxr and call sooner if not decline in tolerance > did not return       04/22/2017  [facility] office visit/ Brianna [name]  / re-establish re RA assoc ILD  Chief Complaint  Patient presents with  . Pulmonary Consult    Self referral for cough x 3-4 months. She is coughing up white to yellow sputum.    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  rec Augmentin 875 mg take one pill twice daily  X 10 days - take at breakfast and supper with large glass of water.  It would help reduce the usual side effects (diarrhea and yeast infections) if you ate cultured yogurt at lunch.  Prednisone 10 mg take  4 each am x 2 days,   2 each am x 2 days,  1 each am x 2 days and stop  Stop fosfamax for now  Pantoprazole (protonix) 40 mg   Take  30-60 min before first meal of the day and Pepcid (famotidine)  20 mg one @  bedtime until return to office - this is the best way to tell whether stomach acid is contributing to your problem.   GERD diet     06/14/2017  f/u ov/Brianna [name] re:  uacs ? Aggravated by fosfamax  Chief Complaint  Patient presents with  . Follow-up    PFT done today. Her cough is much improved. No new co's. She wants to discuss alternative med for bones since we stopped the fosamax.  cough still present somedays p stirring  in am / no pm cough at all which is quite an improvement s need for cough suppression Rates herself at 90% better     Not limited by breathing from desired activities    No obvious day to day or daytime variability or assoc excess/ purulent sputum or mucus plugs or hemoptysis or cp or chest tightness, subjective wheeze or overt sinus or hb symptoms. No unusual exposure hx or h/o childhood pna/ asthma or knowledge of premature birth.  Sleeping ok flat without nocturnal  or early am exacerbation  of respiratory  c/o's or need for noct saba. Also denies any obvious fluctuation of symptoms with weather or environmental changes or other aggravating or alleviating factors except as outlined above   Current Allergies, Complete Past Medical History, Past Surgical History, Family History, and Social History were reviewed in [employer] record.  ROS  The following are not active complaints unless bolded Hoarseness, sore throat,  dysphagia, dental problems, itching, sneezing,  nasal congestion or discharge of excess mucus or purulent secretions, ear ache,   fever, chills, sweats, unintended wt loss or wt gain, classically pleuritic or exertional cp,  orthopnea pnd or leg swelling, presyncope, palpitations, abdominal pain, anorexia, nausea, vomiting, diarrhea  or change in bowel habits or change in bladder habits, change in stools or change in urine, dysuria, hematuria,  rash, arthralgias, visual complaints, headache, numbness, weakness or ataxia or problems with walking or coordination,  change in mood/affect or memory.        Current Meds  Medication Sig  . Adalimumab 40 MG/0.8ML PNKT Inject 40 mg into the skin.  . ALPHAGAN P 0.1 % SOLN Place 1 drop into the left eye.   Marland Kitchen amLODipine (NORVASC) 5 MG tablet Take 5 mg by mouth daily.    Marland Kitchen atorvastatin (LIPITOR) 40 MG tablet Take 40 mg by mouth daily.    . botulinum toxin Type A (BOTOX) 100 units SOLR injection Inject IM into the head and neck muscles every 3 months by provider in office  . Bromfenac Sodium (BROMSITE) 0.075 % SOLN INSTILL 1 DROP IN BOTH EYES TWICE DAILY  . butalbital-acetaminophen-caffeine (FIORICET, ESGIC) 50-325-40 MG tablet Take 1 tablet by mouth every 6 (six) hours as needed for headache.  . Calcium Carbonate-Vit D-Min (CALCIUM 1200 PO) Take 2 capsules by mouth daily.    . Cholecalciferol (VITAMIN D PO) Take 1,000 Units by mouth daily.   . dorzolamide-timolol (COSOPT) 22.3-6.8 MG/ML ophthalmic solution Place 1 drop into both eyes 2 (two) times daily.   Marland Kitchen ezetimibe (ZETIA) 10 MG tablet Take 10 mg by mouth daily.    . frovatriptan (FROVA) 2.5 MG tablet Take 1 tablet (2.5 mg total) by mouth as needed for migraine. If recurs, may repeat after 2 hours. Max of 2 tabs in 24 hours.  Marland Kitchen leflunomide (ARAVA) 10 MG tablet Take 20 mg by mouth daily.   . Lifitegrast (XIIDRA) 5 % SOLN INSTILL 1 DROP INTO BOTH EYES TWICE DAILY  . LORazepam (ATIVAN) 0.5 MG tablet Take 0.5  mg by mouth daily as needed for anxiety or sleep.   . NONFORMULARY OR COMPOUNDED ITEM Inject 1 application into the skin every 30 (thirty) days.  . pantoprazole (PROTONIX) 40 MG tablet Take 1 tablet (40 mg total) by mouth daily. Take 30-60 min before first meal of the day  . phenytoin (DILANTIN) 300 MG ER capsule Take 1 capsule (300 mg total) by mouth daily.  . prednisoLONE acetate (PRED FORTE) 1 % ophthalmic suspension  Place 1-4 drops into both eyes daily. 6 DROPS INTO R EYE-- ONE DROP INTO L EYE  . promethazine (PHENERGAN) 25 MG tablet Take 25 mg by mouth every 6 (six) hours as needed for nausea or vomiting.   . SUMAtriptan (IMITREX) 6 MG/0.5ML SOLN injection Inject 0.5 mLs (6 mg total) into the skin as needed for migraine.  . timolol (TIMOPTIC) 0.5 % ophthalmic solution Place 1 drop into the left eye every other day.   . topiramate (TOPAMAX) 100 MG tablet TAKE 1 TABLET(100 MG) BY MOUTH AT BEDTIME  . zolmitriptan (ZOMIG) 5 MG nasal solution Place 1 spray into the nose as needed for migraine.       Objective:   Physical Exam   Pleasant amb wf/ nad, in fact, all smiles today  Wt 130 01/14/2011 > 08/27/2013  126 >  11/13/2013 125 > 04/22/2017  125 > 06/14/2017   127   Vital signs reviewed:   - Note on arrival 02 sats  94% on RA       HEENT: nl dentition, turbinates bilaterally, and oropharynx. Nl external ear canals without cough reflex   NECK :  without JVD/Nodes/TM/ nl carotid upstrokes bilaterally   LUNGS: no acc muscle use,  Nl contour chest with minimal  Bronchovesicular bs bilaterally, min late insp crackles bases as well bilaterally    CV:  RRR  no s3 or murmur or increase in P2, and no edema   ABD:  soft and nontender with nl inspiratory excursion in the supine position. No bruits or organomegaly appreciated, bowel sounds nl  MS:  Nl gait/ ext warm without deformities, calf tenderness, cyanosis or clubbing Mild ra changes MCP's bilaterally   SKIN: warm and dry without  lesions    NEURO:  alert, approp, nl sensorium with  no motor or cerebellar deficits apparent.        CXR PA and Lateral:   04/22/2017 :    I personally reviewed images again 06/14/2017  To correlate with pfts  and agree with radiology impression as follows:    1. Basilar predominant fibrotic lung changes, progressed relative to 2016. 2. No other active cardiopulmonary disease. 3. Aortic atherosclerosis.  Assessment:

## 2017-06-14 NOTE — Patient Instructions (Signed)
Restart fosmax 2 weeks before visit (= 2 doses ) prior to your visit with your rheumatologist  Risk of reflux goes up on fosfamax so ideally I would recommend a substitute either Reclast or Prolia if feasible but defer the final call on this issue to your rheumatologist   Please schedule a follow up visit in 3 months but call sooner if needed

## 2017-06-14 NOTE — Progress Notes (Signed)
PFT done today. 

## 2017-06-15 ENCOUNTER — Encounter: Payer: Self-pay | Admitting: Internal Medicine

## 2017-06-15 NOTE — Assessment & Plan Note (Signed)
-   Assoc with RA      - PFT's 06/15/2006 FEV1 (1.53)  Ratio 84 and DLC0 46 corrects to 90%     - PFT's  03/04/2011  FEV1 (1.73)  Ratio 83 and VC 2.11 and DLC0 63% corrects  94%     - PFT's 11/13/2013    VC 1.94 with DLCO 65 corrects to 102%     - 08/27/2013  Walked RA x 2 laps @ 185 ft each stopped due to  Oximeter stopped recording accurately but sats still 96% and no sob - PFT's  06/14/2017  FVC 1.71  (60 % ) ratio 89  p 5 % improvement from saba p nothing prior to study with DLCO  59 % corrects to 97 % for alv volume   pfts are overall stable with no limits related to breathing and prognosis is entirely based on control of systemic dz in this setting.  I had an extended discussion with the patient reviewing all relevant studies completed to date and  lasting 15 to 20 minutes of a 25 minute visit    Each maintenance medication was reviewed in detail including most importantly the difference between maintenance and prns and under what circumstances the prns are to be triggered using an action plan format that is not reflected in the computer generated alphabetically organized AVS.    Please see AVS for specific instructions unique to this visit that I personally wrote and verbalized to the the pt in detail and then reviewed with pt  by my nurse highlighting any  changes in therapy recommended at today's visit to their plan of care.

## 2017-06-15 NOTE — Assessment & Plan Note (Signed)
-   Sinus CT  01/14/2011 > neg  - Trial off fosfamax and on GERD rx 04/22/17 > improved 06/14/2017   Better p rx for Upper airway cough syndrome (previously labeled PNDS),  is so named because it's frequently impossible to sort out how much is  CR/sinusitis with freq throat clearing (which can be related to primary GERD)   vs  causing  secondary (" extra esophageal")  GERD from wide swings in gastric pressure that occur with throat clearing, often  promoting self use of mint and menthol lozenges that reduce the lower esophageal sphincter tone and exacerbate the problem further in a cyclical fashion.   These are the same pts (now being labeled as having "irritable larynx syndrome" by some cough centers) who not infrequently have a history of having failed to tolerate ace inhibitors,  dry powder inhalers or biphosphonates or report having atypical/extraesophageal reflux symptoms that don't respond to standard doses of PPI  and are easily confused as having aecopd or asthma flares by even experienced allergists/ pulmonologists (myself included).   She could also have a low grade sinus infection that responded to augmentin but is so much better I am reluctant to change anything for now. Ok to rechallenge with fosfamax prior to next ov with rheum to decide risk vs benefit of continue fosfamax vs alternative rx = prolia/ reclast

## 2017-06-16 ENCOUNTER — Encounter: Payer: Self-pay | Admitting: Physical Therapy

## 2017-06-16 ENCOUNTER — Ambulatory Visit: Payer: BC Managed Care – PPO | Admitting: Physical Therapy

## 2017-06-16 DIAGNOSIS — M542 Cervicalgia: Secondary | ICD-10-CM | POA: Diagnosis not present

## 2017-06-16 DIAGNOSIS — G8929 Other chronic pain: Secondary | ICD-10-CM

## 2017-06-16 DIAGNOSIS — R51 Headache: Secondary | ICD-10-CM

## 2017-06-16 NOTE — Therapy (Signed)
[facility] [address] [place], [place], [address] Phone: [phone]   Fax:  [phone]  Physical Therapy Treatment  Patient Details  Name: Brianna [name] MRN: [id number] Date of Birth: [birth year] Referring Provider: Anson Fret, MD   Encounter Date: 06/16/2017  PT End of Session - 06/16/17 0802    Visit Number  7    Number of Visits  13    Date for PT Re-Evaluation  06/24/17    Authorization Type  BCBS    PT Start Time  0800    PT Stop Time  0845    PT Time Calculation (min)  45 min    Activity Tolerance  Patient tolerated treatment well    Behavior During Therapy  [facility] for tasks assessed/performed       Past Medical History:  Diagnosis Date  . Carotid artery occlusion   . Epilepsy (HCC)   . Glaucoma   . Hyperlipidemia   . Hypertension   . Macular degeneration   . Migraines   . Raynaud's disease   . Retinal edema   . Rheumatoid arthritis(714.0)     Past Surgical History:  Procedure Laterality Date  . CAROTID ARTERY ANGIOPLASTY  2008   Dr Madilyn Fireman  . CAROTID ENDARTERECTOMY Left 11-15-2007  . cataract extraction Right 04-2014   [facility]  . CHOLECYSTECTOMY  1995  . CORNEAL TRANSPLANT     x 5 ; steroid inj. retnal information  . CORNEAL TRANSPLANT Right 01-22-2014   [place]  . EYE SURGERY      There were no vitals filed for this visit.  Subjective Assessment - 06/16/17 0802    Subjective  left side of neck has been tight for about 3-4 days. Has been doing exercises which are helping some but not exceptionally. Weights seem to make it worse. May have aggrivated it by lifting/reaching with decorating.     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  Yes    Pain Score  6     Pain Location  Neck    Pain Orientation  Left    Pain Descriptors / Indicators  Tightness                      [facility] Adult PT  Treatment/Exercise - 06/16/17 0001      Shoulder Exercises: Seated   Other Seated Exercises  seated row blue tband      Shoulder Exercises: Prone   Retraction  20 reps;Both;AROM      Shoulder Exercises: ROM/Strengthening   UBE (Upper Arm Bike)  3'/3' L1      Shoulder Exercises: Stretch   Other Shoulder Stretches  child pose      Neck Exercises: Stretches   Upper Trapezius Stretch  30 seconds both    Levator Stretch  30 seconds both    Other Neck Stretches  rotation SNAG             PT Education - 06/16/17 0845    Education provided  Yes    Education Details  anatomy of condition, exercise form/rationale    Person(s) Educated  Patient    Methods  Explanation;Demonstration;Tactile cues;Verbal cues;Handout    Comprehension  Verbalized understanding;Need further instruction;Returned demonstration;Verbal cues required;Tactile cues required       PT Short Term Goals - 05/11/17 0947      PT SHORT TERM GOAL #1  Title  Pt will verbalize ability to correct posture utilizing appropriate muscular contraction    Baseline  began educating at eval    Time  3    Period  Weeks    Status  New    Target Date  06/03/17        PT Long Term Goals - 05/11/17 0947      PT LONG TERM GOAL #1   Title  FOTO to 38% limitation to indicate significant improvement in functional ability    Baseline  52% limitation at eval    Time  6    Period  Weeks    Status  New    Target Date  06/24/17      PT LONG TERM GOAL #2   Title  Pt will be able to brush her teeth without limitation by neck pain    Baseline  reports severe pain at eval    Time  6    Period  Weeks    Status  New    Target Date  06/24/17      PT LONG TERM GOAL #3   Title  Pt will be able to garden & complete volunteer activities, neck pain <=3/10    Baseline  unable to look down 5-10 min to complete due to neck pain    Time  6    Period  Weeks    Status  New    Target Date  06/24/17      PT LONG TERM GOAL #4    Title  gross periscapular musculature strength 5/5 for proper support to biomechanical chain    Baseline  see flowsheet    Time  6    Period  Weeks    Status  New    Target Date  06/24/17            Plan - 06/16/17 0840    Clinical Impression Statement  Pt with singificant difficulty isolating periscapular musculature in prone. Reported decrease tightness of neck after education on proper alignment in stretches and use of SNAGs. Will continue to challenge periscapular isolation at next visit.     PT Treatment/Interventions  ADLs/Self Care Home Management;Cryotherapy;Electrical Stimulation;Functional mobility training;Ultrasound;Traction;Moist Heat;Therapeutic activities;Therapeutic exercise;Balance training;Neuromuscular re-education;Patient/family education;Passive range of motion;Manual techniques;Dry needling;Taping    PT Next Visit Plan  review stretch form, prone periscapular exercises    PT Home Exercise Plan  rows, bilat GHJ ER, upper trap, levator and neck retraction; prone scap retraction, child pose, SNAG       Patient will benefit from skilled therapeutic intervention in order to improve the following deficits and impairments:  Impaired UE functional use, Increased muscle spasms, Decreased activity tolerance, Pain, Improper body mechanics, Decreased balance, Decreased strength, Postural dysfunction  Visit Diagnosis: Cervicalgia  Chronic nonintractable headache, unspecified headache type     Problem List Patient Active Problem List   Diagnosis Date Noted  . History of macular degeneration 09/13/2016  . History of migraine 09/06/2016  . History of seizures 09/06/2016  . Iritis of right eye 05/12/2016  . Rheumatoid arthritis with positive rheumatoid factor (HCC) 05/11/2016  . ILD (interstitial lung disease) (HCC) 05/11/2016  . 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  . High risk medication use 05/11/2016  . Osteoporosis 05/11/2016  . 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 History of carotid endarterectomy 06/27/2014  . Migraine without aura 04/19/2013  . Seizures (HCC) 04/19/2013  . 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 Cough 01/14/2011  .  Pulmonary fibrosis (HCC) 01/14/2011    Valarie Farace C. Deette Revak PT, DPT 06/16/17 8:46 AM   [facility] [address] [place], [place], [address] Phone: [phone]   Fax:  [phone]  Name: JALEEN SCHLADER MRN: [id number] Date of Birth: 09-25-1950

## 2017-06-17 ENCOUNTER — Ambulatory Visit: Payer: BC Managed Care – PPO | Admitting: Physical Therapy

## 2017-06-17 ENCOUNTER — Encounter: Payer: Self-pay | Admitting: Physical Therapy

## 2017-06-17 DIAGNOSIS — G8929 Other chronic pain: Secondary | ICD-10-CM

## 2017-06-17 DIAGNOSIS — R519 Headache, unspecified: Secondary | ICD-10-CM

## 2017-06-17 DIAGNOSIS — M542 Cervicalgia: Secondary | ICD-10-CM | POA: Diagnosis not present

## 2017-06-17 DIAGNOSIS — R51 Headache: Secondary | ICD-10-CM

## 2017-06-17 NOTE — Therapy (Signed)
[facility] [address] [place], [place], [address] Phone: [phone]   Fax:  [phone]  Physical Therapy Treatment  Patient Details  Name: Brianna [name] MRN: [id number] Date of Birth: 09-Dec-1950 Referring Provider: Anson Fret, MD   Encounter Date: 06/17/2017  PT End of Session - 06/17/17 0807    Visit Number  8    Number of Visits  13    Date for PT Re-Evaluation  06/24/17    Authorization Type  BCBS    PT Start Time  0807 pt arrived late    PT Stop Time  0845    PT Time Calculation (min)  38 min    Activity Tolerance  Patient tolerated treatment well    Behavior During Therapy  [facility] for tasks assessed/performed       Past Medical History:  Diagnosis Date  . Carotid artery occlusion   . Epilepsy (HCC)   . Glaucoma   . Hyperlipidemia   . Hypertension   . Macular degeneration   . Migraines   . Raynaud's disease   . Retinal edema   . Rheumatoid arthritis(714.0)     Past Surgical History:  Procedure Laterality Date  . CAROTID ARTERY ANGIOPLASTY  2008   Dr Madilyn Fireman  . CAROTID ENDARTERECTOMY Left 11-15-2007  . cataract extraction Right 04-2014   [facility]  . CHOLECYSTECTOMY  1995  . CORNEAL TRANSPLANT     x 5 ; steroid inj. retnal information  . CORNEAL TRANSPLANT Right 01-22-2014   [facility]  . EYE SURGERY      There were no vitals filed for this visit.  Subjective Assessment - 06/17/17 0807    Subjective  Did a lot more decorating so a little tightness in Lt shoulder returned but I felt good.     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, looking down                      [facility] Adult PT Treatment/Exercise - 06/17/17 0001      Neck Exercises: Machines for Strengthening   Other Machines for Strengthening  nu step UE only L5 6 min      Shoulder Exercises: Prone   Retraction  10 reps;Both    Extension  20 reps retraction +  extension    Other Prone Exercises  elbow/knee planks      Shoulder Exercises: Standing   Extension  Other (comment) green tband ext    Row  Other (comment) blue tband      Neck Exercises: Stretches   Upper Trapezius Stretch  30 seconds    Levator Stretch  30 seconds             PT Education - 06/16/17 0845    Education provided  Yes    Education Details  anatomy of condition, exercise form/rationale    Person(s) Educated  Patient    Methods  Explanation;Demonstration;Tactile cues;Verbal cues;Handout    Comprehension  Verbalized understanding;Need further instruction;Returned demonstration;Verbal cues required;Tactile cues required       PT Short Term Goals - 05/11/17 0947      PT SHORT TERM GOAL #1   Title  Pt will verbalize ability to correct posture utilizing appropriate muscular contraction    Baseline  began educating at eval    Time  3    Period  Weeks    Status  New    Target Date  06/03/17  PT Long Term Goals - 05/11/17 0947      PT LONG TERM GOAL #1   Title  FOTO to 38% limitation to indicate significant improvement in functional ability    Baseline  52% limitation at eval    Time  6    Period  Weeks    Status  New    Target Date  06/24/17      PT LONG TERM GOAL #2   Title  Pt will be able to brush her teeth without limitation by neck pain    Baseline  reports severe pain at eval    Time  6    Period  Weeks    Status  New    Target Date  06/24/17      PT LONG TERM GOAL #3   Title  Pt will be able to garden & complete volunteer activities, neck pain <=3/10    Baseline  unable to look down 5-10 min to complete due to neck pain    Time  6    Period  Weeks    Status  New    Target Date  06/24/17      PT LONG TERM GOAL #4   Title  gross periscapular musculature strength 5/5 for proper support to biomechanical chain    Baseline  see flowsheet    Time  6    Period  Weeks    Status  New    Target Date  06/24/17            Plan -  06/17/17 0845    Clinical Impression Statement  Cont to require cuing but is better able to verbalize appropriate muscle use and postural alignment. Discussed time and practice required for habitual postural changes.POC ends next week, will focus on proper form in exercises and gym-specific movements.     PT Treatment/Interventions  ADLs/Self Care Home Management;Cryotherapy;Electrical Stimulation;Functional mobility training;Ultrasound;Traction;Moist Heat;Therapeutic activities;Therapeutic exercise;Balance training;Neuromuscular re-education;Patient/family education;Passive range of motion;Manual techniques;Dry needling;Taping    PT Next Visit Plan  periscapular strengthening exercises, review gym equipment    PT Home Exercise Plan  rows, bilat GHJ ER, upper trap, levator and neck retraction; prone scap retraction, child pose, SNAG    Consulted and Agree with Plan of Care  Patient       Patient will benefit from skilled therapeutic intervention in order to improve the following deficits and impairments:  Impaired UE functional use, Increased muscle spasms, Decreased activity tolerance, Pain, Improper body mechanics, Decreased balance, Decreased strength, Postural dysfunction  Visit Diagnosis: Cervicalgia  Chronic nonintractable headache, unspecified headache type     Problem List Patient Active Problem List   Diagnosis Date Noted  . History of macular degeneration 09/13/2016  . History of migraine 09/06/2016  . History of seizures 09/06/2016  . Iritis of right eye 05/12/2016  . Rheumatoid arthritis with positive rheumatoid factor (HCC) 05/11/2016  . ILD (interstitial lung disease) (HCC) 05/11/2016  . 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  . High risk medication use 05/11/2016  . Osteoporosis 05/11/2016  . 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 History of carotid endarterectomy 06/27/2014  . Migraine without aura 04/19/2013  .  Seizures (HCC) 04/19/2013  . 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 Cough 01/14/2011  . Pulmonary fibrosis (HCC) 01/14/2011  [name] C. Jazmeen Axtell PT, DPT 06/17/17 8:47 AM   [facility] [address] [place], [place], [address] Phone: [phone]   Fax:  [phone]  Name: Brianna  RILLIE [name] MRN: [id number] Date of Birth: 10-01-1950

## 2017-06-29 ENCOUNTER — Ambulatory Visit: Payer: BC Managed Care – PPO | Attending: Neurology | Admitting: Physical Therapy

## 2017-06-29 ENCOUNTER — Encounter: Payer: Self-pay | Admitting: Physical Therapy

## 2017-06-29 DIAGNOSIS — M542 Cervicalgia: Secondary | ICD-10-CM | POA: Insufficient documentation

## 2017-06-29 DIAGNOSIS — G8929 Other chronic pain: Secondary | ICD-10-CM

## 2017-06-29 DIAGNOSIS — R51 Headache: Secondary | ICD-10-CM | POA: Diagnosis present

## 2017-06-29 NOTE — Therapy (Signed)
[facility] [place], [place], [address] Phone: [phone]   Fax:  [phone]  Physical Therapy Treatment/Discharge Summary  Patient Details  Name: Brianna [name] MRN: [id number] Date of Birth: [birth year] Referring Provider: Sarina Ill, MD   Encounter Date: 06/29/2017  PT End of Session - 06/29/17 1010    Visit Number  9    Number of Visits  13    Date for PT Re-Evaluation  06/29/17    Authorization Type  BCBS    PT Start Time  1194    PT Stop Time  1053    PT Time Calculation (min)  38 min    Activity Tolerance  Patient tolerated treatment well    Behavior During Therapy  [facility] for tasks assessed/performed       Past Medical History:  Diagnosis Date  . Carotid artery occlusion   . Epilepsy ([place])   . Glaucoma   . Hyperlipidemia   . Hypertension   . Macular degeneration   . Migraines   . Raynaud's disease   . Retinal edema   . Rheumatoid arthritis(714.0)     Past Surgical History:  Procedure Laterality Date  . CAROTID ARTERY ANGIOPLASTY  2008   Dr Amedeo Plenty  . CAROTID ENDARTERECTOMY Left 11-15-2007  . cataract extraction Right 04-2014   [place]  . CORNEAL TRANSPLANT     x 5 ; steroid inj. retnal information  . CORNEAL TRANSPLANT Right 01-22-2014   [facility]  . EYE SURGERY      There were no vitals filed for this visit.  Subjective Assessment - 06/29/17 1015    Subjective  It gets tight and I do the exercises. When I am conscious of it my posture is ok. I think my posture at night is the problem. Overall I feel more confident. Has been in [place] with sister in hospital.     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 PT Assessment - 06/29/17 0001      Assessment   Medical Diagnosis  chronic migraine, neck pain    Referring Provider  Sarina Ill, MD      Observation/Other  Assessments   Focus on Therapeutic Outcomes (FOTO)   21% limited      AROM   Cervical Flexion  60    Cervical Extension  50    Cervical - Right Side Bend  44    Cervical - Left Side Bend  46    Cervical - Right Rotation  46    Cervical - Left Rotation  48      Strength   Right Shoulder Flexion  5/5    Right Shoulder ABduction  4+/5    Right Shoulder External Rotation  5/5    Right Shoulder Horizontal ABduction  5/5    Left Shoulder Flexion  5/5    Left Shoulder ABduction  4+/5    Left Shoulder External Rotation  5/5    Left Shoulder Horizontal ABduction  5/5                  [facility] Adult PT Treatment/Exercise - 06/29/17 0001      Therapeutic Activites    Therapeutic Activities  Other Therapeutic Activities    Other Therapeutic Activities  sleeping and transfer posture post op             PT  Education - 06/29/17 1056    Education provided  Yes    Education Details  posture and care following cornea transplant, exercise form/rationale, breathing to decrease strain of movements, goals, FOTO    Person(s) Educated  Patient    Methods  Explanation;Tactile cues;Demonstration;Verbal cues    Comprehension  Verbalized understanding;Returned demonstration       PT Short Term Goals - 06/29/17 1037      PT SHORT TERM GOAL #1   Title  Pt will verbalize ability to correct posture utilizing appropriate muscular contraction    Status  Achieved        PT Long Term Goals - 06/29/17 1037      PT LONG TERM GOAL #1   Title  FOTO to 38% limitation to indicate significant improvement in functional ability    Baseline  21% limited    Status  Achieved      PT LONG TERM GOAL #2   Title  Pt will be able to brush her teeth without limitation by neck pain    Baseline  no problem    Status  Achieved      PT LONG TERM GOAL #3   Title  Pt will be able to garden & complete volunteer activities, neck pain <=3/10    Baseline  no problems    Status  Achieved      PT LONG  TERM GOAL #4   Title  gross periscapular musculature strength 5/5 for proper support to biomechanical chain    Baseline  see flowsheet    Status  Partially Met            Plan - 06/29/17 1057    Clinical Impression Statement  Pt has made significant progress since bigging PT and is now d/c to independent program. Verbalized comfort and understanding of important ideas and continued HEP. Set up appropriate postures and practiced moving after eye surgery to reduce cervical and eye strain. Pt was encouraged to contact us with any further questions.     PT Treatment/Interventions  ADLs/Self Care Home Management;Cryotherapy;Electrical Stimulation;Functional mobility training;Ultrasound;Traction;Moist Heat;Therapeutic activities;Therapeutic exercise;Balance training;Neuromuscular re-education;Patient/family education;Passive range of motion;Manual techniques;Dry needling;Taping    PT Home Exercise Plan  rows, bilat GHJ ER, upper trap, levator and neck retraction; prone scap retraction, child pose, SNAG    Consulted and Agree with Plan of Care  Patient       Patient will benefit from skilled therapeutic intervention in order to improve the following deficits and impairments:  Impaired UE functional use, Increased muscle spasms, Decreased activity tolerance, Pain, Improper body mechanics, Decreased balance, Decreased strength, Postural dysfunction  Visit Diagnosis: Cervicalgia - Plan: PT plan of care cert/re-cert  Chronic nonintractable headache, unspecified headache type - Plan: PT plan of care cert/re-cert     Problem List Patient Active Problem List   Diagnosis Date Noted  . History of macular degeneration 09/13/2016  . History of migraine 09/06/2016  . History of seizures 09/06/2016  . Iritis of right eye 05/12/2016  . Rheumatoid arthritis with positive rheumatoid factor ([place]) 05/11/2016  . ILD (interstitial lung disease) ([place]) 05/11/2016  . Osteoarthritis of lumbar spine  05/11/2016  . High risk medication use 05/11/2016  . Osteoporosis 05/11/2016  . 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 History of carotid endarterectomy 06/27/2014  . Migraine without aura 04/19/2013  . Seizures ([place]) 04/19/2013  . 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 Cough 01/14/2011  . Pulmonary fibrosis ([place]) 01/14/2011    PHYSICAL  THERAPY DISCHARGE SUMMARY  Visits from Start of Care: 9  Current functional level related to goals / functional outcomes: See above   Remaining deficits: See above   Education / Equipment: Anatomy of condition, POC, HEP, exercise form/rationale  Plan: Patient agrees to discharge.  Patient goals were met. Patient is being discharged due to meeting the stated rehab goals.  ?????     Denell Cothern C. Kerby Borner PT, DPT 06/29/17 11:03 AM   [facility] [address] [place], [place], [address] Phone: [phone]   Fax:  [phone]  Name: Brianna [name] MRN: [id number] Date of Birth: 1951-06-13

## 2017-07-01 ENCOUNTER — Encounter: Payer: BC Managed Care – PPO | Admitting: Physical Therapy

## 2017-07-02 ENCOUNTER — Other Ambulatory Visit: Payer: Self-pay | Admitting: Internal Medicine

## 2017-07-02 DIAGNOSIS — R059 Cough, unspecified: Secondary | ICD-10-CM

## 2017-07-02 DIAGNOSIS — R05 Cough: Secondary | ICD-10-CM

## 2017-07-04 ENCOUNTER — Telehealth: Payer: Self-pay | Admitting: Neurology

## 2017-07-04 NOTE — Telephone Encounter (Signed)
Called and spoke with patient. She has tried a dose of Ajovy and is very happy with the results and would like to be started on it. Her migraines have been cut in half. She states she has been trained on it. She is concerned because she has an upcoming corneal transplant in February and does not want a bad migraine, which has happened in the past. She also would like to schedule her next Botox appt.

## 2017-07-04 NOTE — Telephone Encounter (Signed)
Pt is asking for a call re: her being in need of her next Ajovy injection, please call.  She is asking about approval.  She has not contacted her insurance company for status

## 2017-07-04 NOTE — Telephone Encounter (Signed)
Pt would like a call about scheduling her next Botox appointment

## 2017-07-05 ENCOUNTER — Other Ambulatory Visit: Payer: Self-pay | Admitting: Neurology

## 2017-07-05 MED ORDER — FREMANEZUMAB-VFRM 225 MG/1.5ML ~~LOC~~ SOSY: 225.0000 mg | PREFILLED_SYRINGE | SUBCUTANEOUS | 11 refills | Status: DC

## 2017-07-05 NOTE — Telephone Encounter (Signed)
I have called in the prescription. Make sure she has a copay card. Also, we have samples if she is close to her next due date and we can get her a couple. Let her know thanks

## 2017-07-05 NOTE — Telephone Encounter (Addendum)
Patient returned call. She will come to the office to pickup the Ajovy savings card. She will call for a sample of Ajovy if she is unable to get next dose through the pharmacy. Confirmed that she will be seeing Dr. Lucia Gaskins 08/02/17 @ 9:30 AM for epilepsy. Patient verbalized understanding and appreciation.

## 2017-07-05 NOTE — Telephone Encounter (Signed)
Called and LVM asking for call back regarding Ajovy and savings card.

## 2017-07-06 NOTE — Telephone Encounter (Signed)
I called and scheduled the patient for her next injection.  °

## 2017-07-07 ENCOUNTER — Ambulatory Visit (INDEPENDENT_AMBULATORY_CARE_PROVIDER_SITE_OTHER): Payer: BC Managed Care – PPO | Admitting: Family

## 2017-07-07 ENCOUNTER — Encounter: Payer: Self-pay | Admitting: Family

## 2017-07-07 ENCOUNTER — Other Ambulatory Visit: Payer: Self-pay | Admitting: Rheumatology

## 2017-07-07 ENCOUNTER — Ambulatory Visit (HOSPITAL_COMMUNITY)
Admission: RE | Admit: 2017-07-07 | Discharge: 2017-07-07 | Disposition: A | Discharge status: Home / Self Care | Payer: BC Managed Care – PPO | Source: Ambulatory Visit | Attending: Vascular Surgery | Admitting: Vascular Surgery

## 2017-07-07 VITALS — BP 169/96 | HR 75 | Temp 97.9°F | Resp 17 | Wt 127.0 lb

## 2017-07-07 DIAGNOSIS — R0989 Other specified symptoms and signs involving the circulatory and respiratory systems: Secondary | ICD-10-CM | POA: Diagnosis not present

## 2017-07-07 DIAGNOSIS — Z9889 Other specified postprocedural states: Secondary | ICD-10-CM | POA: Diagnosis not present

## 2017-07-07 DIAGNOSIS — I6522 Occlusion and stenosis of left carotid artery: Secondary | ICD-10-CM | POA: Diagnosis not present

## 2017-07-07 DIAGNOSIS — Z8249 Family history of ischemic heart disease and other diseases of the circulatory system: Secondary | ICD-10-CM | POA: Diagnosis not present

## 2017-07-07 DIAGNOSIS — I7300 Raynaud's syndrome without gangrene: Secondary | ICD-10-CM

## 2017-07-07 LAB — VAS US CAROTID
LEFT ECA DIAS: -13 cm/s
LEFT VERTEBRAL DIAS: -22 cm/s
Left CCA dist dias: -26 cm/s
Left CCA dist sys: -78 cm/s
Left CCA prox dias: 23 cm/s
Left CCA prox sys: 86 cm/s
Left ICA dist dias: -29 cm/s
Left ICA dist sys: -82 cm/s
Left ICA prox dias: -17 cm/s
Left ICA prox sys: -55 cm/s
RIGHT CCA MID DIAS: -19 cm/s
RIGHT ECA DIAS: -8 cm/s
RIGHT VERTEBRAL DIAS: -12 cm/s
Right CCA prox dias: 23 cm/s
Right CCA prox sys: 75 cm/s
Right cca dist sys: 118 cm/s

## 2017-07-07 NOTE — Telephone Encounter (Signed)
Last Visit: 02/10/17 Next Visit: 07/22/17 Labs: 02/10/17 WNL  Okay to refill per Dr. Corliss Skains

## 2017-07-07 NOTE — Progress Notes (Signed)
Chief Complaint: Follow up Extracranial Carotid Artery Stenosis   History of Present Illness  Brianna [name] is a 66 y.o. female who is s/p left carotid endarterectomy on 11/15/2007 by Dr. Darrick PennaFields.  She reports 2 TIA's; the second was about 2006 as manifested by expressive aphasia, right hemiparesis, severe headache, and transient left monocular loss of vision. She was told that brain imaging found evidence of a previous TIA but she does not recall any sx's of this. She was evaluated by Dr. Sandria ManlyLove at that time, neurologist.  She has slight residual right leg and arm weakness when she gets overly tired, but this is improving over time.  She denies claudication sx's with walking, denies any hx of cardiac problems. She does report having Raynaud's Syndrome with cold and numb feet, hands are also affected.  She states her mother had an abdominal aortic aneurysm, pt denies abdominal pain, she denies any new back pain, has the chronic lumbar spine issues.   She is receiving botox injections for headaches, managed by Dr. Lucia GaskinsAhern  Pt states her left pupil remains dilated since she had several corneal surgeries.  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 She had ESI's for lumbar spine "slipped disc" which hellped  Pt Diabetic: no Pt smoker: former smoker, quit at age 66, started at age 66  Pt meds include: Statin : yes ASA: yes Other anticoagulants/antiplatelets: no     Past Medical History:  Diagnosis Date  . Carotid artery occlusion   . Epilepsy (HCC)   . Glaucoma   . Hyperlipidemia   . Hypertension   . Macular degeneration   . Migraines   . Raynaud's disease   . Retinal edema   . Rheumatoid arthritis(714.0)     Social History Social History   Tobacco Use  . Smoking status: Former Smoker    Packs/day: 0.50    Years: 14.00    Pack years: 7.00    Types: Cigarettes    Last attempt to quit: 07/19/1988     Years since quitting: 28.9  . Smokeless tobacco: Never Used  Substance Use Topics  . Alcohol use: No  . Drug use: No    Family History Family History  Problem Relation Age of Onset  . Heart disease Mother   . Lung disease Mother        ? disease process  . Uterine cancer Mother   . Heart disease Father   . Clotting disorder Father   . Collagen disease Father   . Cervical cancer Maternal Aunt   . Prostate cancer Maternal Grandfather   . Rheum arthritis Sister   . High Cholesterol Sister   . Epilepsy Sister   . Rheum arthritis Maternal Grandmother   . High Cholesterol Brother   . High blood pressure Brother   . High blood pressure Brother   . High Cholesterol Brother   . High blood pressure Brother   . High Cholesterol Brother     Surgical History Past Surgical History:  Procedure Laterality Date  . CAROTID ARTERY ANGIOPLASTY  2008   Dr Madilyn FiremanHayes  . CAROTID ENDARTERECTOMY Left 11-15-2007  . [facility]  . CHOLECYSTECTOMY  1995  . CORNEAL TRANSPLANT     x 5 ; steroid inj. retnal information  . [facility]  . EYE SURGERY      Allergies  Allergen Reactions  . Morphine Other (See Comments)    Difficulty breathing  . Morphine And Related Shortness Of Breath  . Codeine Nausea And Vomiting  . Hydrocodone-Acetaminophen Nausea And Vomiting    But tolerates tylenol  . Sulfa Antibiotics Rash    "Large bumps"  . Sulfamethoxazole Rash    Current Outpatient Medications  Medication Sig Dispense Refill  . Adalimumab 40 MG/0.8ML PNKT Inject 40 mg into the skin.    Marland Kitchen alendronate (FOSAMAX) 70 MG tablet TAKE 1 TABLET BY MOUTH ONCE A WEEK, WITH A FULL GLASS OF WATER ON AN EMPTY STOMACH 12 tablet 0  . ALPHAGAN P 0.1 % SOLN Place 1 drop into the left eye.   11  . amLODipine (NORVASC) 5 MG tablet Take 5 mg by mouth daily.      Marland Kitchen atorvastatin (LIPITOR) 40 MG tablet Take 40 mg by mouth daily.      . botulinum  toxin Type A (BOTOX) 100 units SOLR injection Inject IM into the head and neck muscles every 3 months by provider in office 2 vial 3  . Bromfenac Sodium (BROMSITE) 0.075 % SOLN INSTILL 1 DROP IN BOTH EYES TWICE DAILY    . butalbital-acetaminophen-caffeine (FIORICET, ESGIC) 50-325-40 MG tablet Take 1 tablet by mouth every 6 (six) hours as needed for headache. 10 tablet 3  . Calcium Carbonate-Vit D-Min (CALCIUM 1200 PO) Take 2 capsules by mouth daily.      . Cholecalciferol (VITAMIN D PO) Take 1,000 Units by mouth daily.     . dorzolamide-timolol (COSOPT) 22.3-6.8 MG/ML ophthalmic solution Place 1 drop into both eyes 2 (two) times daily.     Marland Kitchen ezetimibe (ZETIA) 10 MG tablet Take 10 mg by mouth daily.      . famotidine (PEPCID) 20 MG tablet TAKE 1 TABLET BY MOUTH AT BEDTIME 30 tablet 2  . Fremanezumab-vfrm (AJOVY) 225 MG/1.5ML SOSY Inject 225 mg into the skin every 30 (thirty) days. 1 Syringe 11  . frovatriptan (FROVA) 2.5 MG tablet Take 1 tablet (2.5 mg total) by mouth as needed for migraine. If recurs, may repeat after 2 hours. Max of 2 tabs in 24 hours. 10 tablet 11  . leflunomide (ARAVA) 10 MG tablet Take 20 mg by mouth daily.     . Lifitegrast (XIIDRA) 5 % SOLN INSTILL 1 DROP INTO BOTH EYES TWICE DAILY    . LORazepam (ATIVAN) 0.5 MG tablet Take 0.5 mg by mouth daily as needed for anxiety or sleep.     . NONFORMULARY OR COMPOUNDED ITEM Inject 1 application into the skin every 30 (thirty) days. 1 each 11  . pantoprazole (PROTONIX) 40 MG tablet Take 1 tablet (40 mg total) by mouth daily. Take 30-60 min before first meal of the day 30 tablet 2  . phenytoin (DILANTIN) 300 MG ER capsule Take 1 capsule (300 mg total) by mouth daily. 30 capsule 11  . prednisoLONE acetate (PRED FORTE) 1 % ophthalmic suspension Place 1-4 drops into both eyes daily. 6 DROPS INTO R EYE-- ONE DROP INTO L EYE    . promethazine (PHENERGAN) 25 MG tablet Take 25 mg by mouth every 6 (six) hours as needed for nausea or vomiting.      . SUMAtriptan (IMITREX) 6 MG/0.5ML SOLN injection Inject 0.5 mLs (6 mg total) into the skin as needed for migraine. 8 vial 3  . timolol (TIMOPTIC) 0.5 % ophthalmic solution Place 1 drop into the left eye every other day.     . topiramate (TOPAMAX) 100  MG tablet TAKE 1 TABLET(100 MG) BY MOUTH AT BEDTIME 30 tablet 11  . zolmitriptan (ZOMIG) 5 MG nasal solution Place 1 spray into the nose as needed for migraine. 12 Units 11   Current Facility-Administered Medications  Medication Dose Route Frequency Provider Last Rate Last Dose  . botulinum toxin Type A (BOTOX) injection 200 Units  200 Units Intramuscular Once Ramond Marrow, DO      . botulinum toxin Type A (BOTOX) injection 200 Units  200 Units Intramuscular Once Ramond Marrow, DO      . botulinum toxin Type A (BOTOX) injection 200 Units  200 Units Intramuscular Once Ramond Marrow, DO      . botulinum toxin Type A (BOTOX) injection 200 Units  200 Units Intramuscular Once Ramond Marrow, DO      . pneumococcal 23 valent vaccine (PNU-IMMUNE) injection 0.5 mL  0.5 mL Intramuscular Once Nyoka Cowden, MD        Review of Systems : See HPI for pertinent positives and negatives.  Physical Examination  Vitals:   07/07/17 1138 07/07/17 1141  BP: (!) 177/93 (!) 169/96  Pulse: 75   Resp: 17   Temp: 97.9 F (36.6 C)   TempSrc: Oral   Weight: 127 lb (57.6 kg)    Body mass index is 24 kg/m.  General: WDWN female in NAD GAIT:normal Eyes: Right eye with milky film. Left pupil is irregularly shaped. Pulmonary: Respirations are non-labored, CTAB, no rales, rhonchi, or wheezing.  Cardiac: regular rhythm and rate, no detected murmur.  VASCULAR EXAM Carotid Bruits Right Left   Negative Negative   Abdominal aortic pulse is mildly palpable on expiration. Radial pulses are 2+ palpable and equal.   LE Pulses Right Left       FEMORAL  2+ palpable   2+ palpable  POPLITEAL 2+ palpable not  palpable  POSTERIOR TIBIAL 2+palpable 2+palpable  DORSALIS PEDIS ANTERIOR TIBIAL faintly palpable 2+ palpable    Gastrointestinal: soft, nontender, BS WNL, no r/g, no palpable masses.  Musculoskeletal: No muscle atrophy/wasting. M/S 5/5 throughout, extremities without ischemic changes.  Skin: No rash, no cellulitis, no ulcers noted.   Neurologic: A&O X 3; appropriate affect, speech is normal, CN 2-12 intact, pain and light touch intact in extremities, Motor exam as listed above    Assessment: Brianna [name] is a 66 y.o. female who is s/p left carotid endarterectomy on 11/15/2007.  She reports 2 TIA's; the second was about 2006 as manifested by expressive aphasia, right hemiparesis, severe headache, and transient left monocular loss of vision. She was told that brain imaging found evidence of a previous TIA but she does not recall any sx's of this. She was evaluated by Dr. Sandria Manly at that time, neurologist.  She has slight residual right leg and arm weakness when she gets overly tired.   Her mother had an abdominal aortic aneurysm, pt has no symptoms referable to an AAA, her abdominal aortic pulse is mildly palpable on expiration. She also has a prominent right popliteal pulse. Will check AAA duplex and bilateral popliteal artery duplex when she returns in a year for carotid duplex. Will also check ABI's with TBI's, mostly to evaluate both great toes arterial perfusion due to all toes cold to touch, feel cold and numb to pt, and has a diagnosis of Raynaud's Syndrome. She already takes a calcium channel blocker to address her blood pressure which also is an initial treatment for Raynaud's Syndrome.    DATA  Carotid  Duplex (07/07/17): Right ICA: 1-39% stenosis. Left ICA: CEA site with no restenosis.  No significant stenosis of the bilateral CCA or ECA. Bilateral vertebral artery flow is antegrade.  Bilateral subclavian artery waveforms are normal.  No significant  change compared to the exams on 07-03-15 and 07-01-16.   Plan: Follow-up in 1 year with Carotid Duplex scan, AAA duplex., bilateral popliteal artery duplex, and ABI's with TBI's.      I discussed in depth with the patient the nature of atherosclerosis, and emphasized the importance of maximal medical management including strict control of blood pressure, blood glucose, and lipid levels, obtaining regular exercise, and continued cessation of smoking.  The patient is aware that without maximal medical management the underlying atherosclerotic disease process will progress, limiting the benefit of any interventions. The patient was given information about stroke prevention and what symptoms should prompt the patient to seek immediate medical care. Thank you for allowing [place] to participate in this patient's care.  Charisse March, RN, MSN, FNP-C [facility] Office: [phone]  Clinic Physician: Darrick Penna  07/07/17 11:58 AM

## 2017-07-07 NOTE — Patient Instructions (Addendum)
Stroke Prevention Some health problems and behaviors may make it more likely for you to have a stroke. Below are ways to lessen your risk of having a stroke.  Be active for at least 30 minutes on most or all days.  Do not smoke. Try not to be around others who smoke.  Do not drink too much alcohol. ? Do not have more than 2 drinks a day if you are a man. ? Do not have more than 1 drink a day if you are a woman and are not pregnant.  Eat healthy foods, such as fruits and vegetables. If you were put on a specific diet, follow the diet as told.  Keep your cholesterol levels under control through diet and medicines. Look for foods that are low in saturated fat, trans fat, cholesterol, and are high in fiber.  If you have diabetes, follow all diet plans and take your medicine as told.  Ask your doctor if you need treatment to lower your blood pressure. If you have high blood pressure (hypertension), follow all diet plans and take your medicine as told by your doctor.  If you are 10-75 years old, have your blood pressure checked every 3-5 years. If you are age 7 or older, have your blood pressure checked every year.  Keep a healthy weight. Eat foods that are low in calories, salt, saturated fat, trans fat, and cholesterol.  Do not take drugs.  Avoid birth control pills, if this applies. Talk to your doctor about the risks of taking birth control pills.  Talk to your doctor if you have sleep problems (sleep apnea).  Take all medicine as told by your doctor. ? You may be told to take aspirin or blood thinner medicine. Take this medicine as told by your doctor. ? Understand your medicine instructions.  Make sure any other conditions you have are being taken care of.  Get help right away if:  You suddenly lose feeling (you feel numb) or have weakness in your face, arm, or leg.  Your face or eyelid hangs down to one side.  You suddenly feel confused.  You have trouble talking  (aphasia) or understanding what people are saying.  You suddenly have trouble seeing in one or both eyes.  You suddenly have trouble walking.  You are dizzy.  You lose your balance or your movements are clumsy (uncoordinated).  You suddenly have a very bad headache and you do not know the cause.  You have new chest pain.  Your heart feels like it is fluttering or skipping a beat (irregular heartbeat). Do not wait to see if the symptoms above go away. Get help right away. Call your local emergency services (911 in [place] Do not drive yourself to the hospital. This information is not intended to replace advice given to you by your health care provider. Make sure you discuss any questions you have with your health care provider. Document Released: 01/04/2012 Document Revised: 12/11/2015 Document Reviewed: 01/05/2013 Elsevier Interactive Patient Education  2018 [employer]    Raynaud Phenomenon Raynaud phenomenon is a condition that affects the blood vessels (arteries) that carry blood to your fingers and toes. The arteries that supply blood to your ears or the tip of your nose might also be affected. Raynaud phenomenon causes the arteries to temporarily narrow. As a result, the flow of blood to the affected areas is temporarily decreased. This usually occurs in response to cold temperatures or stress. During an attack, the skin in the  affected areas turns white. You may also feel tingling or numbness in those areas. Attacks usually last for only a brief period, and then the blood flow to the area returns to normal. In most cases, Raynaud phenomenon does not cause serious health problems. What are the causes? For many people with this condition, the cause is not known. Raynaud phenomenon is sometimes associated with other diseases, such as scleroderma or lupus. What increases the risk? Raynaud phenomenon can affect anyone, but it develops most often in people who are 57-44 years old. It  affects more females than males. What are the signs or symptoms? Symptoms of Raynaud phenomenon may occur when you are exposed to cold temperatures or when you have emotional stress. The symptoms may last for a few minutes or up to several hours. They usually affect your fingers but may also affect your toes, ears, or the tip of your nose. Symptoms may include:  Changes in skin color. The skin in the affected areas will turn pale or white. The skin may then change from white to bluish to red as normal blood flow returns to the area.  Numbness, tingling, or pain in the affected areas.  In severe cases, sores may develop in the affected areas. How is this diagnosed? Your health care provider will do a physical exam and take your medical history. You may be asked to put your hands in cold water to check for a reaction to cold temperature. Blood tests may be done to check for other diseases or conditions. Your health care provider may also order a test to check the movement of blood through your arteries and veins (vascular ultrasound). How is this treated? Treatment often involves making lifestyle changes and taking steps to control your exposure to cold temperatures. For more severe cases, medicine (calcium channel blockers) may be used to improve blood flow. Surgery is sometimes done to block the nerves that control the affected arteries, but this is rare. Follow these instructions at home:  Avoid exposure to cold by taking these steps: ? If possible, stay indoors during cold weather. ? When you go outside during cold weather, dress in layers and wear mittens, a hat, a scarf, and warm footwear. ? Wear mittens or gloves when handling ice or frozen food. ? Use holders for glasses or cans containing cold drinks. ? Let warm water run for a while before taking a shower or bath. ? Warm up the car before driving in cold weather.  If possible, avoid stressful and emotional situations. Exercise,  meditation, and yoga may help you cope with stress. Biofeedback may be useful.  Do not use any tobacco products, including cigarettes, chewing tobacco, or electronic cigarettes. If you need help quitting, ask your health care provider.  Avoid secondhand smoke.  Limit your use of caffeine. Switch to decaffeinated coffee, tea, and soda. Avoid chocolate.  Wear loose fitting socks and comfortable, roomy shoes.  Avoid vibrating tools and machinery.  Take medicines only as directed by your health care provider. Contact a health care provider if:  Your discomfort becomes worse despite lifestyle changes.  You develop sores on your fingers or toes that do not heal.  Your fingers or toes turn black.  You have breaks in the skin on your fingers or toes.  You have a fever.  You have pain or swelling in your joints.  You have a rash.  Your symptoms occur on only one side of your body. This information is not intended to replace  advice given to you by your health care provider. Make sure you discuss any questions you have with your health care provider. Document Released: 07/02/2000 Document Revised: 12/11/2015 Document Reviewed: 01/07/2016 Elsevier Interactive Patient Education  2017 [employer].

## 2017-07-11 NOTE — Addendum Note (Signed)
Addended by: Burton Apley A on: 07/11/2017 10:25 AM   Modules accepted: Orders

## 2017-07-15 ENCOUNTER — Other Ambulatory Visit: Payer: Self-pay | Admitting: Internal Medicine

## 2017-07-15 DIAGNOSIS — R05 Cough: Secondary | ICD-10-CM

## 2017-07-15 DIAGNOSIS — R059 Cough, unspecified: Secondary | ICD-10-CM

## 2017-07-16 NOTE — Progress Notes (Signed)
Office Visit Note  [name]             Date of Birth: 02/25/1951           MRN: [id number]             PCP: Pearson Grippe, MD Referring: Pearson Grippe, MD Visit Date: 07/28/2017 Occupation: @GUAROCC @    Subjective:  Worsening vision in left eye    History of Present Illness: Brianna [name] is a 66 y.o. female with a history of seropositive rheumatoid arthritis, ILD, and iritis.  Patient states she continues to take [place] and Humira.  She denies any joint pain or joint swelling.  She lower back is stiff in the morning but overall she has no pain.  She states she discontinued Fosamax about 3 months ago due to significant acid reflux.  She followed up with Dr. Sherene Sires who discontinued the Fosamax.  He felt the reflux was causing a worsening cough and damaging her lungs according to the patient.  She would like to try Prolia if she can. Her biggest concern is her worsening vision.  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 She is having corneal surgery of her right eye on September 01, 2017. She states her left eye vision is worsening.  She is concerned her rheumatoid arthritis is attacking her optic nerve.  She is seeing Dr. Clelia Croft her retinal specialist today.    Activities of Daily Living:  Patient reports morning stiffness for 5 minutes.   Patient Denies nocturnal pain.  Difficulty dressing/grooming: Denies Difficulty climbing stairs: Denies Difficulty getting out of chair: Denies Difficulty using hands for taps, buttons, cutlery, and/or writing: Denies   Review of Systems  Constitutional: Negative for fatigue and weakness.  HENT: Negative for mouth sores, mouth dryness and nose dryness.   Eyes: Positive for visual disturbance. Negative for dryness.  Respiratory: Positive for cough (URI). Negative for hemoptysis, shortness of breath and difficulty breathing.   Cardiovascular: Negative for chest pain, palpitations, hypertension, irregular heartbeat and  swelling in legs/feet.  Gastrointestinal: Negative for blood in stool, constipation and diarrhea.  Endocrine: Negative for increased urination.  Genitourinary: Negative for painful urination.  Musculoskeletal: Positive for morning stiffness. Negative for arthralgias, joint pain, joint swelling, myalgias, muscle weakness, muscle tenderness and myalgias.  Skin: Positive for color change (Raynaud's in hands and feet). Negative for pallor, rash, hair loss, nodules/bumps, redness, skin tightness, ulcers and sensitivity to sunlight.  Neurological: Positive for headaches. Negative for dizziness and numbness.  Hematological: Negative for swollen glands.  Psychiatric/Behavioral: Negative for depressed mood and sleep disturbance. The patient is not nervous/anxious.     PMFS History:  Patient Active Problem List   Diagnosis Date Noted  . History of macular degeneration 09/13/2016  . History of migraine 09/06/2016  . History of seizures 09/06/2016  . Iritis of right eye 05/12/2016  . Rheumatoid arthritis with positive rheumatoid factor (HCC) 05/11/2016  . ILD (interstitial lung disease) (HCC) 05/11/2016  . 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  . High risk medication use 05/11/2016  . Osteoporosis 05/11/2016  . 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 History of carotid endarterectomy 06/27/2014  . Migraine without aura 04/19/2013  . Seizures (HCC) 04/19/2013  . 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 Cough 01/14/2011  . Pulmonary fibrosis (HCC) 01/14/2011    Past Medical History:  Diagnosis Date  . Carotid artery occlusion   . Epilepsy (HCC)   .  Glaucoma   . Hyperlipidemia   . Hypertension   . Keratoconus of both eyes 1981  . Macular degeneration   . Migraines   . Raynaud's disease   . Retinal edema   . Rheumatoid arthritis(714.0)     Family History  Problem Relation Age of Onset  . Heart disease Mother   . Lung disease Mother        ? disease  process  . Uterine cancer Mother   . Heart disease Father   . Clotting disorder Father   . Collagen disease Father   . Cervical cancer Maternal Aunt   . Prostate cancer Maternal Grandfather   . Rheum arthritis Sister   . High Cholesterol Sister   . Epilepsy Sister   . Rheum arthritis Maternal Grandmother   . High Cholesterol Brother   . High blood pressure Brother   . High blood pressure Brother   . High Cholesterol Brother   . High blood pressure Brother   . High Cholesterol Brother    Past Surgical History:  Procedure Laterality Date  . CAROTID ARTERY ANGIOPLASTY  2008   Dr Madilyn Fireman  . CAROTID ENDARTERECTOMY Left 11-15-2007  . cataract extraction Right 04-2014   [facility]  . CHOLECYSTECTOMY  1995  . CORNEAL TRANSPLANT     x 5 ; steroid inj. retnal information  . CORNEAL TRANSPLANT Right 01-22-2014   [facility]  . EYE SURGERY Right 12/2016   cornea repair   . GLAUCOMA SURGERY Right 2018   Social History   Social History Narrative   Patient is married to [name]), has 2 children   Patient is right handed   Education level is Bachelor's degree   Caffeine consumption is 2 cups daily   Lives at home with husband      Objective: Vital Signs: BP 140/84 (BP Location: Left Arm, Patient Position: Sitting, Cuff Size: Normal)   Pulse 74   Resp 15   Ht 5\' 2"  (1.575 m)   Wt 126 lb (57.2 kg)   BMI 23.05 kg/m    Physical Exam  Constitutional: She is oriented to person, place, and time. She appears well-developed and well-nourished.  HENT:  Head: Normocephalic and atraumatic.  Eyes: Conjunctivae and EOM are normal.  Neck: Normal range of motion.  Cardiovascular: Normal rate, regular rhythm, normal heart sounds and intact distal pulses.  Pulmonary/Chest: Effort normal and breath sounds normal.  Abdominal: Soft. Bowel sounds are normal.  Lymphadenopathy:    She has no cervical adenopathy.  Neurological: She is alert and oriented to person, place, and time.  Skin: Skin  is warm and dry. Capillary refill takes less than 2 seconds.  Psychiatric: She has a normal mood and affect. Her behavior is normal.  Nursing note and vitals reviewed.    Musculoskeletal Exam: C-spine, thoracic, and lumbar spine good ROM.  Shoulder joints, elbow joints, and wrist joints good ROM.  MCPs, PIPs, and DIPs good ROM with no synovitis.  Complete formation.  Hip joints, knee joints, ankle joints, MTPs, PIPs, and DIPs good ROM with no synovitis.  No midline spinal tenderness.  No SI joint tenderness.  No trochanteric bursitis.    CDAI Exam: No CDAI exam completed.    Investigation: No additional findings.TB Gold: 02/10/2017 Negative  CBC Latest Ref Rng & Units 02/10/2017 11/16/2007 11/14/2007  WBC 3.8 - 10.8 K/uL 7.1 10.8(H) 9.0  Hemoglobin 11.7 - 15.5 g/dL 11/16/2007 52.0 REPEATED TO VERIFY(L) 14.3  Hematocrit 35.0 - 45.0 % 42.5 33.0(L) 42.0  Platelets 140 - 400 K/uL 287 200 REPEATED TO VERIFY DELTA CHECK NOTED 267   CMP Latest Ref Rng & Units 02/10/2017 11/16/2007 11/14/2007  Glucose 65 - 99 mg/dL 90 102(H) 80  BUN 7 - 25 mg/dL 13 6 11   Creatinine 0.50 - 0.99 mg/dL 8.52 7.78  Sodium 135 - 146 mmol/L 140 140 139  Potassium 3.5 - 5.3 mmol/L 4.9 3.7 3.6  Chloride 98 - 110 mmol/L 105 104 101  CO2 20 - 31 mmol/L 21 29 31   Calcium 8.6 - 10.4 mg/dL 9.5 8.6 9.2  Total Protein 6.1 - 8.1 g/dL 7.6 - 7.6  Total Bilirubin 0.2 - 1.2 mg/dL 0.3 - 2.42)  Alkaline Phos 33 - 130 U/L 110 - 84  AST 10 - 35 U/L 31 - 22  ALT 6 - 29 U/L 21 - 19    Imaging: No results found.  Speciality Comments: No specialty comments available.    Procedures:  No procedures performed Allergies: Morphine; Morphine and related; Codeine; Hydrocodone-acetaminophen; Sulfa antibiotics; and Sulfamethoxazole   Assessment / Plan:     Visit Diagnoses: Rheumatoid arthritis involving multiple sites with positive rheumatoid factor (HCC) - Positive RF, positive anti-CCP. Patient has no synovitis on examination clinically  she is doing well.- Plan: Sedimentation rate  High risk medication use - Arava 20 mg by mouth daily, Humira 40 mg subcutaneous every other week  - Plan: CBC with Differential/Platelet, COMPLETE METABOLIC PANEL WITH GFR today and then every 3 months  ILD (interstitial lung disease) (HCC) - Have advised her to make a follow-up appointment with Dr. .  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 She's been closely followed up by corneal specialist.  Age-related osteoporosis without current pathological fracture - Prolia in the past/ currently on Fosamax  -3.3 T score distal radius left on 02/07/13. DEXA done by her GYN. - Plan: VITAMIN D 25 Hydroxy (Vit-D Deficiency, Fractures). Different treatment options and their side effects were discussed at length. We decided to proceed with Reclast. I've advised her to request a copy of DEXA from her PCP. Once we get the DEXA scan results we can schedule her Reclast infusion.  DDD (degenerative disc disease), lumbar: Chronic pain   Other medical problems are listed as follows:  History of glaucoma  History of carotid endarterectomy  History of macular degeneration  History of migraine  History of seizures    Orders: Orders Placed This Encounter  Procedures  . CBC with Differential/Platelet  . COMPLETE METABOLIC PANEL WITH GFR  . Sedimentation rate  . VITAMIN D 25 Hydroxy (Vit-D Deficiency, Fractures)   No orders of the defined types were placed in this encounter.   Face-to-face time spent with patient was 30 minutes. Greater than 50% of time was spent in counseling and coordination of care.  Follow-Up Instructions: Return for Rheumatoid arthritis, Osteoporosis, DDD.    Note - This record has been created using Sherene Sires.  Chart creation errors have been sought, but may not always  have been located. Such creation errors do not reflect on  the standard of medical  care.

## 2017-07-20 ENCOUNTER — Telehealth: Payer: Self-pay | Admitting: Neurology

## 2017-07-20 NOTE — Telephone Encounter (Signed)
As I was working on this patients authorization for botox I noticed her last office visit notes stated she was still having severe migraines. I called to ask about her reduction in head aches days and she stated that she does feel she had a 50 percent reduction once beginning botox, she is still maintaining a reduction in head aches days and intensity and she also has a reduction in medication use. I noted this on the auth request form.

## 2017-07-22 ENCOUNTER — Ambulatory Visit: Payer: BC Managed Care – PPO | Admitting: Rheumatology

## 2017-07-23 ENCOUNTER — Other Ambulatory Visit: Payer: Self-pay | Admitting: Internal Medicine

## 2017-07-23 DIAGNOSIS — R05 Cough: Secondary | ICD-10-CM

## 2017-07-23 DIAGNOSIS — R059 Cough, unspecified: Secondary | ICD-10-CM

## 2017-07-25 ENCOUNTER — Ambulatory Visit: Payer: BC Managed Care – PPO | Admitting: Neurology

## 2017-07-25 ENCOUNTER — Encounter: Payer: Self-pay | Admitting: Neurology

## 2017-07-25 ENCOUNTER — Encounter (INDEPENDENT_AMBULATORY_CARE_PROVIDER_SITE_OTHER): Payer: Self-pay

## 2017-07-25 VITALS — BP 165/90 | HR 76 | Ht 62.0 in | Wt 125.8 lb

## 2017-07-25 DIAGNOSIS — G40009 Localization-related (focal) (partial) idiopathic epilepsy and epileptic syndromes with seizures of localized onset, not intractable, without status epilepticus: Secondary | ICD-10-CM

## 2017-07-25 MED ORDER — PHENYTOIN SODIUM EXTENDED 300 MG PO CAPS: 300.0000 mg | ORAL_CAPSULE | Freq: Every day | ORAL | 5 refills | Status: DC

## 2017-07-25 NOTE — Progress Notes (Signed)
[facility]    Provider:  Dr Lucia Gaskins Referring Provider: Pearson Grippe, MD Primary Care Physician:  Pearson Grippe, MD  CC:  Chronic intractable headaches, seizures  HPI:  Brianna [name] is a 67 y.o. female here as a referral from Dr. Selena Batten for Patient with chronic intractable headaches. She has had good response to botox (>50% reduction) but still technically has chronic migraines. Ajovy has helped significantly. New problem, she had a seizure at the age of 63 because she missed her medication. 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 In college she would feel strange and have strange time awareness before the seizures. She was started in dilantin. She saw a specialist and tried other medications but Dilantinis the only one that helped. She gets regular bone density tests, last seizure was 30 years ago. Been on Dilantin since the age of 7. She is not interested in changing.   Review of Systems: Patient complains of symptoms per HPI as well as the following symptoms: history of seizures. Pertinent negatives and positives per HPI. All others negative.   Social History   Socioeconomic History  . Marital status: Married    Spouse name: [name]  . Number of children: 2  . Years of education: Bachelor  . Highest education level: Not on file  Social Needs  . Financial resource strain: Not on file  . Food insecurity - worry: Not on file  . Food insecurity - inability: Not on file  . Transportation needs - medical: Not on file  . Transportation needs - non-medical: Not on file  Occupational History  . Occupation: Runner, broadcasting/film/video  . Occupation: [employer] TEACHER    Employer: [employer]  Tobacco Use  . Smoking status: Former Smoker    Packs/day: 0.50    Years: 14.00    Pack years: 7.00    Types: Cigarettes    Last attempt to quit:  07/19/1988    Years since quitting: 29.0  . Smokeless tobacco: Never Used  Substance and Sexual Activity  . Alcohol use: No  . Drug use: No  . Sexual activity: Not on file  Other Topics Concern  . Not on file  Social History Narrative   Patient is married to [name]), has 2 children   Patient is right handed   Education level is Bachelor's degree   Caffeine consumption is 2 cups daily   Lives at home with husband     Family History  Problem Relation Age of Onset  . Heart disease Mother   . Lung disease Mother        ? disease process  . Uterine cancer Mother   . Heart disease Father   . Clotting disorder Father   . Collagen disease Father   . Cervical cancer Maternal Aunt   . Prostate cancer Maternal Grandfather   . Rheum arthritis Sister   . High Cholesterol Sister   . Epilepsy Sister   . Rheum arthritis Maternal Grandmother   . High Cholesterol Brother   . High blood pressure Brother   . High blood pressure Brother   . High Cholesterol Brother   . High blood pressure Brother   . High Cholesterol Brother     Past Medical History:  Diagnosis Date  . Carotid artery occlusion   . Epilepsy (HCC)   . Glaucoma   .  Hyperlipidemia   . Hypertension   . Keratoconus of both eyes 1981  . Macular degeneration   . Migraines   . Raynaud's disease   . Retinal edema   . Rheumatoid arthritis(714.0)     Past Surgical History:  Procedure Laterality Date  . CAROTID ARTERY ANGIOPLASTY  2008   Dr Madilyn Fireman  . CAROTID ENDARTERECTOMY Left 11-15-2007  . cataract extraction Right 04-2014   [facility]  . CHOLECYSTECTOMY  1995  . CORNEAL TRANSPLANT     x 5 ; steroid inj. retnal information  . CORNEAL TRANSPLANT Right 01-22-2014   [facility]  . EYE SURGERY Right 12/2016   cornea repair   . GLAUCOMA SURGERY Right 2018    Current Outpatient Medications  Medication Sig Dispense Refill  . Adalimumab 40 MG/0.8ML PNKT Inject 40 mg into the skin.    Marland Kitchen alendronate (FOSAMAX) 70 MG  tablet TAKE 1 TABLET BY MOUTH ONCE A WEEK, WITH A FULL GLASS OF WATER ON AN EMPTY STOMACH 12 tablet 0  . ALPHAGAN P 0.1 % SOLN Place 1 drop into the left eye.   11  . amLODipine (NORVASC) 5 MG tablet Take 5 mg by mouth daily.      Marland Kitchen atorvastatin (LIPITOR) 40 MG tablet Take 40 mg by mouth daily.      . botulinum toxin Type A (BOTOX) 100 units SOLR injection Inject IM into the head and neck muscles every 3 months by provider in office 2 vial 3  . Bromfenac Sodium (BROMSITE) 0.075 % SOLN INSTILL 1 DROP IN BOTH EYES TWICE DAILY    . butalbital-acetaminophen-caffeine (FIORICET, ESGIC) 50-325-40 MG tablet Take 1 tablet by mouth every 6 (six) hours as needed for headache. 10 tablet 3  . Calcium Carbonate-Vit D-Min (CALCIUM 1200 PO) Take 2 capsules by mouth daily.      . Cholecalciferol (VITAMIN D PO) Take 1,000 Units by mouth daily.     . dorzolamide-timolol (COSOPT) 22.3-6.8 MG/ML ophthalmic solution Place 1 drop into both eyes 2 (two) times daily.     Marland Kitchen ezetimibe (ZETIA) 10 MG tablet Take 10 mg by mouth daily.      . famotidine (PEPCID) 20 MG tablet TAKE 1 TABLET BY MOUTH AT BEDTIME 30 tablet 2  . famotidine (PEPCID) 20 MG tablet TAKE 1 TABLET BY MOUTH AT BEDTIME 30 tablet 2  . Fremanezumab-vfrm (AJOVY) 225 MG/1.5ML SOSY Inject 225 mg into the skin every 30 (thirty) days. 1 Syringe 11  . frovatriptan (FROVA) 2.5 MG tablet Take 1 tablet (2.5 mg total) by mouth as needed for migraine. If recurs, may repeat after 2 hours. Max of 2 tabs in 24 hours. 10 tablet 11  . leflunomide (ARAVA) 10 MG tablet Take 20 mg by mouth daily.     . Lifitegrast (XIIDRA) 5 % SOLN INSTILL 1 DROP INTO BOTH EYES TWICE DAILY    . LORazepam (ATIVAN) 0.5 MG tablet Take 0.5 mg by mouth daily as needed for anxiety or sleep.     . NONFORMULARY OR COMPOUNDED ITEM Inject 1 application into the skin every 30 (thirty) days. 1 each 11  . pantoprazole (PROTONIX) 40 MG tablet TAKE 1 TABLET(40 MG) BY MOUTH DAILY 30 TO 60 MINUTES BEFORE FIRST  MEAL OF THE DAY 30 tablet 2  . phenytoin (DILANTIN) 300 MG ER capsule Take 1 capsule (300 mg total) by mouth daily. 30 capsule 11  . prednisoLONE acetate (PRED FORTE) 1 % ophthalmic suspension Place 1-4 drops into both eyes daily. 6 DROPS INTO  R EYE-- ONE DROP INTO L EYE    . promethazine (PHENERGAN) 25 MG tablet Take 25 mg by mouth every 6 (six) hours as needed for nausea or vomiting.     . SUMAtriptan (IMITREX) 6 MG/0.5ML SOLN injection Inject 0.5 mLs (6 mg total) into the skin as needed for migraine. 8 vial 3  . timolol (TIMOPTIC) 0.5 % ophthalmic solution Place 1 drop into the left eye every other day.     . topiramate (TOPAMAX) 100 MG tablet TAKE 1 TABLET(100 MG) BY MOUTH AT BEDTIME 30 tablet 11  . zolmitriptan (ZOMIG) 5 MG nasal solution Place 1 spray into the nose as needed for migraine. 12 Units 11   Current Facility-Administered Medications  Medication Dose Route Frequency Provider Last Rate Last Dose  . botulinum toxin Type A (BOTOX) injection 200 Units  200 Units Intramuscular Once Ramond Marrow, DO      . botulinum toxin Type A (BOTOX) injection 200 Units  200 Units Intramuscular Once Ramond Marrow, DO      . botulinum toxin Type A (BOTOX) injection 200 Units  200 Units Intramuscular Once Ramond Marrow, DO      . botulinum toxin Type A (BOTOX) injection 200 Units  200 Units Intramuscular Once Ramond Marrow, DO      . pneumococcal 23 valent vaccine (PNU-IMMUNE) injection 0.5 mL  0.5 mL Intramuscular Once Nyoka Cowden, MD        Allergies as of 07/25/2017 - Review Complete 07/25/2017  Allergen Reaction Noted  . Morphine Other (See Comments) 06/19/2015  . Morphine and related Shortness Of Breath 01/14/2011  . Codeine Nausea And Vomiting 06/19/2015  . Hydrocodone-acetaminophen Nausea And Vomiting 06/19/2015  . Sulfa antibiotics Rash 01/14/2011  . Sulfamethoxazole Rash 06/19/2015    Vitals: BP (!) 165/90 Comment: has been recently elevated per pt  Pulse 76   Ht 5\' 2"   (1.575 m)   Wt 125 lb 12.8 oz (57.1 kg)   BMI 23.01 kg/m  Last Weight:  Wt Readings from Last 1 Encounters:  07/25/17 125 lb 12.8 oz (57.1 kg)   Last Height:   Ht Readings from Last 1 Encounters:  07/25/17 5\' 2"  (1.575 m)   Physical exam: Exam: Gen: NAD, conversant, well nourised, obese, well groomed                     CV: RRR, no MRG. No Carotid Bruits. No peripheral edema, warm, nontender Eyes: Conjunctivae clear without exudates or hemorrhage  Neuro: Detailed Neurologic Exam  Speech:    Speech is normal; fluent and spontaneous with normal comprehension.  Cognition:    The patient is oriented to person, place, and time;    Cranial Nerves:    The pupils are equal, round, and reactive to light. Visual fields are full to finger confrontation. Extraocular movements are intact. Trigeminal sensation is intact and the muscles of mastication are normal. The face is symmetric. The palate elevates in the midline. Hearing intact. Voice is normal. Shoulder shrug is normal. The tongue has normal motion without fasciculations.   Coordination:    Normal finger to nose and heel to shin. Normal rapid alternating movements.   Gait:    Heel-toe and tandem gait are normal.   Motor Observation:    No asymmetry, no atrophy, and no involuntary movements noted. Tone:    Normal muscle tone.    Posture:    Posture is normal. normal erect    Strength:  Strength is V/V in the upper and lower limbs.      Sensation: intact to LT          Assessment/Plan:  67 year old with hx of seizures. Has been on dilantin long term. Tried other AEDs, Discussed long-term side effects of Dilantin, she would like to stay on this medication. Will continue   - seizure precautions - Discussed Patients with epilepsy have a small risk of sudden unexpected death, a condition referred to as sudden unexpected death in epilepsy (SUDEP). SUDEP is defined specifically as the sudden, unexpected, witnessed or  unwitnessed, nontraumatic and nondrowning death in patients with epilepsy with or without evidence for a seizure, and excluding documented status epilepticus, in which post mortem examination does not reveal a structural or toxicologic cause for death  - Patient is unable to drive, operate heavy machinery, perform activities at heights or participate in water activities until 6 months seizure free. She is well controlled without a seizure for many years.      Naomie Dean, MD  [facility] [address] [place], [place] [address]  Phone [phone] Fax [phone]  A total of 15 minutes was spent in with this patient. Over half this time was spent on counseling patient on the epilepsy diagnosis and different therapeutic options available.

## 2017-07-28 ENCOUNTER — Encounter (INDEPENDENT_AMBULATORY_CARE_PROVIDER_SITE_OTHER): Payer: Self-pay

## 2017-07-28 ENCOUNTER — Ambulatory Visit: Payer: BC Managed Care – PPO | Admitting: Rheumatology

## 2017-07-28 ENCOUNTER — Encounter: Payer: Self-pay | Admitting: Rheumatology

## 2017-07-28 VITALS — BP 140/84 | HR 74 | Resp 15 | Ht 62.0 in | Wt 126.0 lb

## 2017-07-28 DIAGNOSIS — Z87898 Personal history of other specified conditions: Secondary | ICD-10-CM | POA: Diagnosis not present

## 2017-07-28 DIAGNOSIS — M810 Age-related osteoporosis without current pathological fracture: Secondary | ICD-10-CM

## 2017-07-28 DIAGNOSIS — Z79899 Other long term (current) drug therapy: Secondary | ICD-10-CM | POA: Diagnosis not present

## 2017-07-28 DIAGNOSIS — H44113 Panuveitis, bilateral: Secondary | ICD-10-CM | POA: Insufficient documentation

## 2017-07-28 DIAGNOSIS — J849 Interstitial pulmonary disease, unspecified: Secondary | ICD-10-CM | POA: Diagnosis not present

## 2017-07-28 DIAGNOSIS — H209 Unspecified iridocyclitis: Secondary | ICD-10-CM

## 2017-07-28 DIAGNOSIS — Z9889 Other specified postprocedural states: Secondary | ICD-10-CM | POA: Diagnosis not present

## 2017-07-28 DIAGNOSIS — M0579 Rheumatoid arthritis with rheumatoid factor of multiple sites without organ or systems involvement: Secondary | ICD-10-CM

## 2017-07-28 DIAGNOSIS — J8410 Pulmonary fibrosis, unspecified: Secondary | ICD-10-CM

## 2017-07-28 DIAGNOSIS — Z8669 Personal history of other diseases of the nervous system and sense organs: Secondary | ICD-10-CM | POA: Diagnosis not present

## 2017-07-28 DIAGNOSIS — M5136 Other intervertebral disc degeneration, lumbar region: Secondary | ICD-10-CM | POA: Diagnosis not present

## 2017-07-28 NOTE — Patient Instructions (Addendum)
Zoledronic Acid injection (Paget's Disease, Osteoporosis) What is this medicine? ZOLEDRONIC ACID (ZOE le dron ik AS id) lowers the amount of calcium loss from bone. It is used to treat Paget's disease and osteoporosis in women. This medicine may be used for other purposes; ask your health care provider or pharmacist if you have questions. COMMON BRAND NAME(S): Reclast, Zometa What should I tell my health care provider before I take this medicine? They need to know if you have any of these conditions: -aspirin-sensitive asthma -cancer, especially if you are receiving medicines used to treat cancer -dental disease or wear dentures -infection -kidney disease -low levels of calcium in the blood -past surgery on the parathyroid gland or intestines -receiving corticosteroids like dexamethasone or prednisone -an unusual or allergic reaction to zoledronic acid, other medicines, foods, dyes, or preservatives -pregnant or trying to get pregnant -breast-feeding How should I use this medicine? This medicine is for infusion into a vein. It is given by a health care professional in a hospital or clinic setting. Talk to your pediatrician regarding the use of this medicine in children. This medicine is not approved for use in children. Overdosage: If you think you have taken too much of this medicine contact a poison control center or emergency room at once. NOTE: This medicine is only for you. Do not share this medicine with others. What if I miss a dose? It is important not to miss your dose. Call your doctor or health care professional if you are unable to keep an appointment. What may interact with this medicine? -certain antibiotics given by injection -NSAIDs, medicines for pain and inflammation, like ibuprofen or naproxen -some diuretics like bumetanide, furosemide -teriparatide This list may not describe all possible interactions. Give your health care provider a list of all the medicines,  herbs, non-prescription drugs, or dietary supplements you use. Also tell them if you smoke, drink alcohol, or use illegal drugs. Some items may interact with your medicine. What should I watch for while using this medicine? Visit your doctor or health care professional for regular checkups. It may be some time before you see the benefit from this medicine. Do not stop taking your medicine unless your doctor tells you to. Your doctor may order blood tests or other tests to see how you are doing. Women should inform their doctor if they wish to become pregnant or think they might be pregnant. There is a potential for serious side effects to an unborn child. Talk to your health care professional or pharmacist for more information. You should make sure that you get enough calcium and vitamin D while you are taking this medicine. Discuss the foods you eat and the vitamins you take with your health care professional. Some people who take this medicine have severe bone, joint, and/or muscle pain. This medicine may also increase your risk for jaw problems or a broken thigh bone. Tell your doctor right away if you have severe pain in your jaw, bones, joints, or muscles. Tell your doctor if you have any pain that does not go away or that gets worse. Tell your dentist and dental surgeon that you are taking this medicine. You should not have major dental surgery while on this medicine. See your dentist to have a dental exam and fix any dental problems before starting this medicine. Take good care of your teeth while on this medicine. Make sure you see your dentist for regular follow-up appointments. What side effects may I notice from receiving this medicine?  Side effects that you should report to your doctor or health care professional as soon as possible: -allergic reactions like skin rash, itching or hives, swelling of the face, lips, or tongue -anxiety, confusion, or depression -breathing problems -changes in  vision -eye pain -feeling faint or lightheaded, falls -jaw pain, especially after dental work -mouth sores -muscle cramps, stiffness, or weakness -redness, blistering, peeling or loosening of the skin, including inside the mouth -trouble passing urine or change in the amount of urine Side effects that usually do not require medical attention (report to your doctor or health care professional if they continue or are bothersome): -bone, joint, or muscle pain -constipation -diarrhea -fever -hair loss -irritation at site where injected -loss of appetite -nausea, vomiting -stomach upset -trouble sleeping -trouble swallowing -weak or tired This list may not describe all possible side effects. Call your doctor for medical advice about side effects. You may report side effects to FDA at 1-800-FDA-1088. Where should I keep my medicine? This drug is given in a hospital or clinic and will not be stored at home. NOTE: This sheet is a summary. It may not cover all possible information. If you have questions about this medicine, talk to your doctor, pharmacist, or health care provider.  2018 Elsevier/Gold Standard (2013-12-01 14:19:57)  Standing Labs We placed an order today for your standing lab work.    Please come back and get your standing labs in April and every 3 months  We have open lab Monday through Friday from 8:30-11:30 AM and 1:30-4 PM at the office of Dr. Pollyann Savoy.   The office is located at [address], [place], [place] [address] No appointment is necessary.   Labs are drawn by [facility].  You may receive a bill from [facility] for your lab work. If you have any questions regarding directions or hours of operation,  please call [phone].

## 2017-07-29 LAB — CBC WITH DIFFERENTIAL/PLATELET
Basophils Absolute: 63 cells/uL (ref 0–200)
Basophils Relative: 0.9 %
Eosinophils Absolute: 525 cells/uL — ABNORMAL HIGH (ref 15–500)
Eosinophils Relative: 7.5 %
HCT: 42.2 % (ref 35.0–45.0)
Hemoglobin: 13.7 g/dL (ref 11.7–15.5)
Lymphs Abs: 1533 cells/uL (ref 850–3900)
MCH: 28.2 pg (ref 27.0–33.0)
MCHC: 32.5 g/dL (ref 32.0–36.0)
MCV: 87 fL (ref 80.0–100.0)
MPV: 11.1 fL (ref 7.5–12.5)
Monocytes Relative: 11.5 %
Neutro Abs: 4074 cells/uL (ref 1500–7800)
Neutrophils Relative %: 58.2 %
Platelets: 274 10*3/uL (ref 140–400)
RBC: 4.85 10*6/uL (ref 3.80–5.10)
RDW: 12.2 % (ref 11.0–15.0)
Total Lymphocyte: 21.9 %
WBC mixed population: 805 cells/uL (ref 200–950)
WBC: 7 10*3/uL (ref 3.8–10.8)

## 2017-07-29 LAB — COMPLETE METABOLIC PANEL WITH GFR
AG Ratio: 1.4 (calc) (ref 1.0–2.5)
ALT: 21 U/L (ref 6–29)
AST: 30 U/L (ref 10–35)
Albumin: 4.4 g/dL (ref 3.6–5.1)
Alkaline phosphatase (APISO): 119 U/L (ref 33–130)
BUN: 12 mg/dL (ref 7–25)
CO2: 28 mmol/L (ref 20–32)
Calcium: 9.3 mg/dL (ref 8.6–10.4)
Chloride: 104 mmol/L (ref 98–110)
Creat: 0.51 mg/dL (ref 0.50–0.99)
GFR, Est African American: 118 mL/min/{1.73_m2} (ref >=60)
GFR, Est Non African American: 102 mL/min/{1.73_m2} (ref >=60)
Globulin: 3.1 g/dL (calc) (ref 1.9–3.7)
Glucose, Bld: 86 mg/dL (ref 65–99)
Potassium: 4.2 mmol/L (ref 3.5–5.3)
Sodium: 139 mmol/L (ref 135–146)
Total Bilirubin: 0.3 mg/dL (ref 0.2–1.2)
Total Protein: 7.5 g/dL (ref 6.1–8.1)

## 2017-07-29 LAB — VITAMIN D 25 HYDROXY (VIT D DEFICIENCY, FRACTURES): Vit D, 25-Hydroxy: 38 ng/mL (ref 30–100)

## 2017-07-29 LAB — SEDIMENTATION RATE: Sed Rate: 6 mm/h (ref 0–30)

## 2017-07-29 NOTE — Progress Notes (Signed)
wnl

## 2017-08-02 ENCOUNTER — Ambulatory Visit (INDEPENDENT_AMBULATORY_CARE_PROVIDER_SITE_OTHER): Payer: BC Managed Care – PPO | Admitting: Neurology

## 2017-08-02 ENCOUNTER — Encounter: Payer: Self-pay | Admitting: Neurology

## 2017-08-02 ENCOUNTER — Telehealth: Payer: Self-pay | Admitting: Neurology

## 2017-08-02 VITALS — BP 147/85 | HR 75

## 2017-08-02 DIAGNOSIS — G43719 Chronic migraine without aura, intractable, without status migrainosus: Secondary | ICD-10-CM

## 2017-08-02 NOTE — Progress Notes (Signed)

## 2017-08-02 NOTE — Progress Notes (Signed)
Botox- 100 units x 2 vials Lot: [id number] EXP: 01/2020 NDC: 3825-0539-76  Bacteriostatic 0.9% Sodium Chloride- 57mL total Lot: [id number] Expiration: 11/17/2018 NDC: 7902-4097-35  Dx: H29.924  S/P  //BCrn

## 2017-08-02 NOTE — Telephone Encounter (Signed)
Botox in 12 wks.

## 2017-08-04 NOTE — Telephone Encounter (Signed)
I called the patient to schedule, she did not answer so I left a  VM asking her to call me back.

## 2017-08-04 NOTE — Telephone Encounter (Signed)
Pt returned Danielle's call. appt scheduled for 11/01/17

## 2017-08-22 ENCOUNTER — Telehealth: Payer: Self-pay | Admitting: Rheumatology

## 2017-08-22 NOTE — Telephone Encounter (Signed)
Patient called checking to see if we received a copy of her bone density scan that she had with Dr. Arelia Sneddon at [facility].

## 2017-08-23 NOTE — Telephone Encounter (Signed)
Returned patient's call and advised patient we have not received a copy of bone density scan. Provided patient with correct fax number. Patient will have it resent.

## 2017-09-12 ENCOUNTER — Telehealth: Payer: Self-pay | Admitting: Rheumatology

## 2017-09-12 NOTE — Telephone Encounter (Signed)
Patient advised we have not received her bone density test results. Patient states she has reached out multiple times. Patient advised I would reach out to them on her behalf to see if I can get the results.

## 2017-09-12 NOTE — Telephone Encounter (Signed)
Patient left a voicemail checking to see if her Bone Density Test was sent to our office.

## 2017-09-15 ENCOUNTER — Ambulatory Visit: Payer: BC Managed Care – PPO | Admitting: Internal Medicine

## 2017-09-19 ENCOUNTER — Other Ambulatory Visit: Payer: Self-pay

## 2017-09-19 DIAGNOSIS — Z79899 Other long term (current) drug therapy: Secondary | ICD-10-CM

## 2017-09-19 LAB — COMPLETE METABOLIC PANEL WITH GFR
AG Ratio: 1.5 (calc) (ref 1.0–2.5)
ALT: 22 U/L (ref 6–29)
AST: 31 U/L (ref 10–35)
Albumin: 4.5 g/dL (ref 3.6–5.1)
Alkaline phosphatase (APISO): 116 U/L (ref 33–130)
BUN: 12 mg/dL (ref 7–25)
CO2: 29 mmol/L (ref 20–32)
Calcium: 9.6 mg/dL (ref 8.6–10.4)
Chloride: 105 mmol/L (ref 98–110)
Creat: 0.61 mg/dL (ref 0.50–0.99)
GFR, Est African American: 111 mL/min/{1.73_m2} (ref >=60)
GFR, Est Non African American: 96 mL/min/{1.73_m2} (ref >=60)
Globulin: 3.1 g/dL (calc) (ref 1.9–3.7)
Glucose, Bld: 91 mg/dL (ref 65–99)
Potassium: 5.2 mmol/L (ref 3.5–5.3)
Sodium: 142 mmol/L (ref 135–146)
Total Bilirubin: 0.4 mg/dL (ref 0.2–1.2)
Total Protein: 7.6 g/dL (ref 6.1–8.1)

## 2017-09-19 LAB — CBC WITH DIFFERENTIAL/PLATELET
Basophils Absolute: 83 cells/uL (ref 0–200)
Basophils Relative: 1.3 %
Eosinophils Absolute: 672 cells/uL — ABNORMAL HIGH (ref 15–500)
Eosinophils Relative: 10.5 %
HCT: 42.5 % (ref 35.0–45.0)
Hemoglobin: 14.2 g/dL (ref 11.7–15.5)
Lymphs Abs: 1517 cells/uL (ref 850–3900)
MCH: 28.5 pg (ref 27.0–33.0)
MCHC: 33.4 g/dL (ref 32.0–36.0)
MCV: 85.2 fL (ref 80.0–100.0)
MPV: 11.5 fL (ref 7.5–12.5)
Monocytes Relative: 12.9 %
Neutro Abs: 3302 cells/uL (ref 1500–7800)
Neutrophils Relative %: 51.6 %
Platelets: 253 10*3/uL (ref 140–400)
RBC: 4.99 10*6/uL (ref 3.80–5.10)
RDW: 12.8 % (ref 11.0–15.0)
Total Lymphocyte: 23.7 %
WBC mixed population: 826 cells/uL (ref 200–950)
WBC: 6.4 10*3/uL (ref 3.8–10.8)

## 2017-09-19 NOTE — Addendum Note (Signed)
Addended by: Leighton Roach on: 09/19/2017 10:19 AM   Modules accepted: Orders

## 2017-09-20 ENCOUNTER — Telehealth: Payer: Self-pay | Admitting: *Deleted

## 2017-09-20 HISTORY — PX: EYE SURGERY: SHX253

## 2017-09-20 NOTE — Progress Notes (Signed)
stable °

## 2017-09-20 NOTE — Telephone Encounter (Signed)
Patient has been scheduled for 10/05/17 at 8:40 am for discussion of reclast.

## 2017-09-20 NOTE — Telephone Encounter (Signed)
We can apply for IV Reclast if she is in agreement with it.

## 2017-09-20 NOTE — Telephone Encounter (Signed)
Patient state she was advised she could no longer take the Fosamax due to her acid reflux and creating problems with her lungs. Patient states she would like to know what other medications she could take as far as her Osteoporosis. Patient has a T-Score of -3.5. Please advise

## 2017-09-21 NOTE — Progress Notes (Signed)
Office Visit Note  [name]             Date of Birth: [birth year]           MRN: [id number]             PCP: Brianna Grippe, MD Referring: Brianna Grippe, MD Visit Date: 10/05/2017 Occupation: @GUAROCC @    Subjective:  Discuss Reclast   History of Present Illness: Brianna [name] is a 67 y.o. female with history of seropositive rheumatoid arthritis, ILD, osteoporosis, and DDD of lumbar spine.  Patient states that she discontinued Fosamax in September 2018.  Patient states she continues to take Calcium and vitamin D.  She would like to discuss starting Reclast today.  Patient states that on 09/20/17 patient she has a corneal PK procedure.  She continues to have significant eye dryness.   Patient states that she has right thumb trigger finger.  She had a cortisone injection in the past by Dr. Merlyn [name].  She has swelling of her thumb joint as well.  She denies any other joint pain or joint swelling. She continues to take Arava 20 mg daily and Humira subcutaneous every other week.  She denies missing any doses or holding any doses for surgery.  Patient states she continues to have lower back pain if she stands for a prolonged period of time.  She performs exercises at home.  She is going to start exercising at the gym in a couple weeks.    Activities of Daily Living:  Patient reports morning stiffness for 5-6 minutes.   Patient Reports nocturnal pain.  Difficulty dressing/grooming: Denies Difficulty climbing stairs: Denies Difficulty getting out of chair: Denies Difficulty using hands for taps, buttons, cutlery, and/or writing: Reports   Review of Systems  Constitutional: Negative for fatigue and weakness.  HENT: Negative for mouth sores, trouble swallowing, trouble swallowing, mouth dryness and nose dryness.   Eyes: Positive for photophobia, pain and dryness. Negative for redness and visual disturbance.  Respiratory: Negative for cough, hemoptysis, shortness of breath and  difficulty breathing.   Cardiovascular: Negative for chest pain, palpitations, hypertension, irregular heartbeat and swelling in legs/feet.  Gastrointestinal: Negative for blood in stool, constipation and diarrhea.  Endocrine: Negative for increased urination.  Genitourinary: Negative for painful urination.  Musculoskeletal: Positive for arthralgias, joint pain, joint swelling and morning stiffness. Negative for myalgias, muscle weakness, muscle tenderness and myalgias.  Skin: Negative for color change, pallor, rash, hair loss, nodules/bumps, redness, skin tightness, ulcers and sensitivity to sunlight.  Allergic/Immunologic: Negative for susceptible to infections.  Neurological: Positive for dizziness and headaches. Negative for numbness.  Hematological: Negative for swollen glands.  Psychiatric/Behavioral: Negative for depressed mood and sleep disturbance. The patient is nervous/anxious.     PMFS History:  Patient Active Problem List   Diagnosis Date Noted  . History of macular degeneration 09/13/2016  . History of migraine 09/06/2016  . History of seizures 09/06/2016  . Iritis of right eye 05/12/2016  . Rheumatoid arthritis with positive rheumatoid factor (HCC) 05/11/2016  . ILD (interstitial lung disease) (HCC) 05/11/2016  . 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  . High risk medication use 05/11/2016  . Osteoporosis 05/11/2016  . 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 History of carotid endarterectomy 06/27/2014  . Migraine without aura 04/19/2013  . Seizures (HCC) 04/19/2013  . 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 Cough 01/14/2011  . Pulmonary fibrosis (HCC) 01/14/2011    Past Medical History:  Diagnosis Date  .  Carotid artery occlusion   . Epilepsy (HCC)   . Glaucoma   . Hyperlipidemia   . Hypertension   . Keratoconus of both eyes 1981  . Macular degeneration   . Migraines   . Raynaud's disease   . Retinal edema   . Rheumatoid  arthritis(714.0)     Family History  Problem Relation Age of Onset  . Heart disease Mother   . Lung disease Mother        ? disease process  . Uterine cancer Mother   . Heart disease Father   . Clotting disorder Father   . Collagen disease Father   . Cervical cancer Maternal Aunt   . Prostate cancer Maternal Grandfather   . Rheum arthritis Sister   . High Cholesterol Sister   . Epilepsy Sister   . Rheum arthritis Maternal Grandmother   . High Cholesterol Brother   . High blood pressure Brother   . High blood pressure Brother   . High Cholesterol Brother   . High blood pressure Brother   . High Cholesterol Brother    Past Surgical History:  Procedure Laterality Date  . CAROTID ARTERY ANGIOPLASTY  2008   Dr Brianna [name]  . CAROTID ENDARTERECTOMY Left 11-15-2007  . cataract extraction Right 04-2014   [facility]  . CHOLECYSTECTOMY  1995  . CORNEAL TRANSPLANT     x 5 ; steroid inj. retnal information  . CORNEAL TRANSPLANT Right 01-22-2014   [facility]  . EYE SURGERY Right 12/2016   cornea repair   . EYE SURGERY Right 09/20/2017   cornea transplant   . GLAUCOMA SURGERY Right 2018   Social History   Social History Narrative   Patient is married to Brianna [name]), has 2 children   Patient is right handed   Education level is Bachelor's degree   Caffeine consumption is 2 cups daily   Lives at home with husband      Objective: Vital Signs: BP (!) 144/93 (BP Location: Left Arm, Patient Position: Sitting, Cuff Size: Normal)   Pulse 75   Resp 16   Ht 5\' 2"  (1.575 m)   Wt 128 lb (58.1 kg)   BMI 23.41 kg/m    Physical Exam  Constitutional: She is oriented to person, place, and time. She appears well-developed and well-nourished.  HENT:  Head: Normocephalic and atraumatic.  Eyes: Conjunctivae and EOM are normal.  Neck: Normal range of motion.  Cardiovascular: Normal rate, regular rhythm, normal heart sounds and intact distal pulses.  Pulmonary/Chest: Effort normal and breath  sounds normal.  Abdominal: Soft. Bowel sounds are normal.  Lymphadenopathy:    She has no cervical adenopathy.  Neurological: She is alert and oriented to person, place, and time.  Skin: Skin is warm and dry. Capillary refill takes less than 2 seconds.  Psychiatric: She has a normal mood and affect. Her behavior is normal.  Nursing note and vitals reviewed.    Musculoskeletal Exam: C-spine, thoracic spine, lumbar spine good range of motion.  No midline spinal tenderness.  No SI joint tenderness.  Shoulder joints, elbow joints, wrist joints, MCPs, PIPs, DIPs good range of motion with no synovitis.  Right trigger thumb noted.  She has some tenderness of her right first MCP.  PIP and DIP synovial thickening consistent with osteoarthritis.  Hip joints, knee joints, ankle joints, MTPs, PIPs, DIPs good range of motion with no synovitis.  No tenderness of trochanteric bursa.  No warmth or effusion of bilateral knees.  No knee crepitus.  CDAI Exam: No CDAI exam completed.    Investigation: No additional findings. CBC Latest Ref Rng & Units 09/19/2017 07/28/2017 02/10/2017  WBC 3.8 - 10.8 Thousand/uL 6.4 7.0 7.1  Hemoglobin 11.7 - 15.5 g/dL 33.2 95.1 88.4  Hematocrit 35.0 - 45.0 % 42.5 42.2 42.5  Platelets 140 - 400 Thousand/uL 253 274 287   CMP Latest Ref Rng & Units 09/19/2017 07/28/2017 02/10/2017  Glucose 65 - 99 mg/dL 91 86 90  BUN 7 - 25 mg/dL 12 12 13   Creatinine 0.50 - 0.99 mg/dL 1.66 0.63  Sodium 135 - 146 mmol/L 142 139 140  Potassium 3.5 - 5.3 mmol/L 5.2 4.2 4.9  Chloride 98 - 110 mmol/L 105 104 105  CO2 20 - 32 mmol/L 29 28 21   Calcium 8.6 - 10.4 mg/dL 9.6 9.3 9.5  Total Protein 6.1 - 8.1 g/dL 7.6 7.5 7.6  Total Bilirubin 0.2 - 1.2 mg/dL 0.4 0.3 0.3  Alkaline Phos 33 - 130 U/L - - 110  AST 10 - 35 U/L 31 30 31   ALT 6 - 29 U/L 22 21 21     Imaging: No results found.  Speciality Comments: No specialty comments available.    Procedures:  No procedures performed Allergies:  Morphine; Morphine and related; Codeine; Hydrocodone-acetaminophen; Sulfa antibiotics; and Sulfamethoxazole   Assessment / Plan:     Visit Diagnoses: Rheumatoid arthritis involving multiple sites with positive rheumatoid factor (HCC): She has no synovitis on exam.  She is clinically doing well on Arava 20 mg daily and Humira 40 mg subcutaneous every other week.  She does not need any refills at this time.  High risk medication use - Arava 20 mg daily and Humira 40 mg subcutaneous every other week. CBC/CMP: 3/4/19TB gold: 02/10/17  Trigger thumb, right thumb: She is following up with Dr. next week.  She has previously had cortisone injections in the past which helped.    ILD (interstitial lung disease) (HCC) - She is followed by Dr. .  Pulmonary fibrosis (HCC)  Iritis of right eye - Followed closely by corneal specialist.    Age-related osteoporosis without current pathological fracture -she was previously on Fosamax and Prolia.  Her DEXA scan 02/07/2013 revealed a T score of -3.3.  Her most recent DEXA on 10/08/2016 T score -3.5.  She continues to take calcium and vitamin D daily.  We discussed the options of starting Forteo vs. Reclast today.  She does not want a daily injection. She would like to start Reclast. We discussed the indications, contraindications, and side effects today. All questions were addressed. Consent was obtained.  She will continue taking vitamin D and calcium daily.  She is CBC and CMP performed on 09/19/2017.  She is aware that she return in 10 days after her infusion for lab work.  Standing orders are in place.  We will apply for Reclast IV infusions.   DDD (degenerative disc disease), lumbar: She continues to have some lower back pain.  Performs exercises that she will do physical therapy on a regular basis.  She has no midline spinal tenderness on exam.  Other medical conditions are listed as follows:  History of glaucoma  History of macular  degeneration  History of seizures  History of migraine  History of carotid endarterectomy    Orders: No orders of the defined types were placed in this encounter.  No orders of the defined types were placed in this encounter.   Face-to-face time spent with patient was 30 minutes. >50%  of time was spent in counseling and coordination of care.  Follow-Up Instructions: Return in about 5 months (around 03/07/2018) for Osteoporosis, Rheumatoid arthritis.   Brianna Bienenstock, PA-C   I examined and evaluated the patient with Brianna Ales PA.  She had right trigger thumb.  She will get cortisone injection by Dr. Merlyn [name].  We had detailed discussion regarding her bone density.  At this point she opted for IV Reclast as she is not interested in daily injections of Forteo.  The plan of care was discussed as noted above.  Pollyann Savoy, MD  Note - This record has been created using Animal nutritionist.  Chart creation errors have been sought, but may not always  have been located. Such creation errors do not reflect on  the standard of medical care.

## 2017-09-21 NOTE — Telephone Encounter (Signed)
Called pts insurance [facility]). Spoke with representative who states that ([id number]) does not require a pre certification and services are covered at [facility].   Reference number: [id number] Phone: [phone]  Abran Duke, CPhT 12:17 PM

## 2017-09-29 ENCOUNTER — Other Ambulatory Visit: Payer: Self-pay | Admitting: Rheumatology

## 2017-09-29 NOTE — Telephone Encounter (Signed)
Last Visit: 07/28/17 Next Visit: 10/05/17 Labs: 09/19/17 stable  Okay to refill per Dr. Corliss Skains

## 2017-10-05 ENCOUNTER — Ambulatory Visit (INDEPENDENT_AMBULATORY_CARE_PROVIDER_SITE_OTHER): Payer: Medicare Other | Admitting: Rheumatology

## 2017-10-05 ENCOUNTER — Encounter (INDEPENDENT_AMBULATORY_CARE_PROVIDER_SITE_OTHER): Payer: Self-pay

## 2017-10-05 ENCOUNTER — Encounter: Payer: Self-pay | Admitting: Physician Assistant

## 2017-10-05 VITALS — BP 144/93 | HR 75 | Resp 16 | Ht 62.0 in | Wt 128.0 lb

## 2017-10-05 DIAGNOSIS — M5136 Other intervertebral disc degeneration, lumbar region: Secondary | ICD-10-CM | POA: Diagnosis not present

## 2017-10-05 DIAGNOSIS — M810 Age-related osteoporosis without current pathological fracture: Secondary | ICD-10-CM

## 2017-10-05 DIAGNOSIS — J849 Interstitial pulmonary disease, unspecified: Secondary | ICD-10-CM

## 2017-10-05 DIAGNOSIS — M0579 Rheumatoid arthritis with rheumatoid factor of multiple sites without organ or systems involvement: Secondary | ICD-10-CM | POA: Diagnosis not present

## 2017-10-05 DIAGNOSIS — Z87898 Personal history of other specified conditions: Secondary | ICD-10-CM

## 2017-10-05 DIAGNOSIS — Z8669 Personal history of other diseases of the nervous system and sense organs: Secondary | ICD-10-CM | POA: Diagnosis not present

## 2017-10-05 DIAGNOSIS — Z9889 Other specified postprocedural states: Secondary | ICD-10-CM | POA: Diagnosis not present

## 2017-10-05 DIAGNOSIS — Z79899 Other long term (current) drug therapy: Secondary | ICD-10-CM

## 2017-10-05 DIAGNOSIS — J8410 Pulmonary fibrosis, unspecified: Secondary | ICD-10-CM | POA: Diagnosis not present

## 2017-10-05 DIAGNOSIS — H209 Unspecified iridocyclitis: Secondary | ICD-10-CM

## 2017-10-05 DIAGNOSIS — M65311 Trigger thumb, right thumb: Secondary | ICD-10-CM | POA: Diagnosis not present

## 2017-10-05 NOTE — Patient Instructions (Signed)

## 2017-10-06 ENCOUNTER — Telehealth: Payer: Self-pay

## 2017-10-06 NOTE — Telephone Encounter (Signed)
Called pts insurance to verify the benefits. Spoke with [facility] who states that the pts plan is active. No pre-cert or prior authorization is required. [facility] is within network. She has an out of pocket maximum of $4000. She has accumulated $266.98. She has no deductible. She will be responsible for $50 co-pay for the cost of the drug.   Reference number: [id number] Phone: [phone]  Abran Duke, CPhT 9:55 AM

## 2017-10-06 NOTE — Telephone Encounter (Signed)
-----   Message from Henriette Combs, LPN sent at 9/39/0300  9:45 AM EDT ----- Regarding: Please apply for Reclast Please apply for Reclast

## 2017-10-11 ENCOUNTER — Ambulatory Visit: Payer: BC Managed Care – PPO | Admitting: Internal Medicine

## 2017-10-11 ENCOUNTER — Encounter: Payer: Self-pay | Admitting: Internal Medicine

## 2017-10-11 VITALS — BP 148/90 | HR 74 | Ht 61.0 in | Wt 124.4 lb

## 2017-10-11 DIAGNOSIS — J8410 Pulmonary fibrosis, unspecified: Secondary | ICD-10-CM | POA: Diagnosis not present

## 2017-10-11 DIAGNOSIS — R05 Cough: Secondary | ICD-10-CM | POA: Diagnosis not present

## 2017-10-11 DIAGNOSIS — R059 Cough, unspecified: Secondary | ICD-10-CM

## 2017-10-11 NOTE — Patient Instructions (Addendum)
No change in medications   Return in 05/2018 withy full pfts - call sooner if needed

## 2017-10-11 NOTE — Progress Notes (Signed)
Subjective:     Patient ID: Brianna [name], female   DOB: 06-16-1951,     MRN: [id number]       Brief patient profile:  65 yowf   quit smoking age 67 with no resp problems then @  36 bad pneumonia.  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   No sign doe but not that active.   Sleeping ok without nocturnal  or early am exac of resp c/o's or need for noct saba.  rec Ok to restart boniva the first of September and every month but if your respiratory or reflux symptoms worsen it's first medication I would stop and take in it's place Reclast IV yearly.    11/13/2013 f/u ov/Brianna [name] re:  PF assoc with RA Chief Complaint  Patient presents with  . Followup with PFT    Pt states that her breathing is unchanged since her last visit. No new co's today.    can now do 1st to 2nd floor s sob and working out regularly s limits due to sob rec  Return in one year for pfts and cxr and call sooner if not decline in tolerance > did not return       04/22/2017  [facility] office visit/ Brianna [name]  / re-establish re RA assoc ILD  Chief Complaint  Patient presents with  . Pulmonary Consult    Self referral for cough x 3-4 months. She is coughing up white to yellow sputum.    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  rec Augmentin 875 mg take one pill twice daily  X 10 days - take at breakfast and supper with large glass of water.  It would help reduce the usual side effects (diarrhea and yeast infections) if you ate cultured yogurt at lunch.  Prednisone 10 mg take  4 each am x 2 days,   2 each am x 2 days,  1 each am x 2 days and stop  Stop fosfamax for now  Pantoprazole (protonix) 40 mg   Take  30-60 min before first meal of the day and Pepcid (famotidine)  20 mg one @  bedtime until return to office - this is the best way to tell whether stomach acid is contributing to your problem.   GERD diet     06/14/2017  f/u ov/Brianna [name] re:  uacs ? Aggravated by fosfamax  Chief Complaint  Patient presents with  . Follow-up    PFT done today. Her cough is much improved. No new co's. She wants to discuss alternative med for bones since we stopped the fosamax.  cough still present somedays  p stirring in am / no pm cough at all which is quite an improvement s need for cough suppression Rates herself at 90% better  rec Restart fosmax 2 weeks before visit (= 2 doses ) prior to your visit with your rheumatologist Risk of reflux goes up on fosfamax so ideally I would recommend a substitute either Reclast or Prolia if feasible but defer the final call on this issue to your rheumatologist      10/11/2017  f/u ov/Brianna [name] re:  uacs  In pt with RA better off fosfamax / still on gerd rx  Chief Complaint  Patient presents with  . Follow-up    wheezing when laying down at night, but cough is better   Dyspnea: Not limited by breathing from desired activities   Cough: minimal  Sleep: ok  SABA use : none    No obvious day to day or daytime variability or assoc excess/ purulent sputum or mucus plugs or hemoptysis or cp or chest tightness, subjective wheeze or overt sinus or hb symptoms. No unusual exposure hx or h/o childhood pna/ asthma or knowledge of  premature birth.  Sleeping ok flat without nocturnal  or early am exacerbation  of respiratory  c/o's or need for noct saba. Also denies any obvious fluctuation of symptoms with weather or environmental changes or other aggravating or alleviating factors except as outlined above   Current Allergies, Complete Past Medical History, Past Surgical History, Family History, and Social History were reviewed in [employer] record.  ROS  The following are not active complaints unless bolded Hoarseness, sore throat, dysphagia, dental problems, itching, sneezing,  nasal congestion or discharge of excess mucus or purulent secretions, ear ache,   fever, chills, sweats, unintended wt loss or wt gain, classically pleuritic or exertional cp,  orthopnea pnd or leg swelling, presyncope, palpitations, abdominal pain, anorexia, nausea, vomiting, diarrhea  or change in bowel habits or change in bladder habits, change in stools or change in urine, dysuria, hematuria,  rash, arthralgias, visual complaints, headache, numbness, weakness or ataxia or problems with walking or coordination,  change in mood/affect or memory.        Current Meds  Medication Sig  . Adalimumab 40 MG/0.8ML PNKT Inject 40 mg into the skin every 14 (fourteen) days.   . ALPHAGAN P 0.1 % SOLN Place 1 drop into the left eye.   Marland Kitchen amLODipine (NORVASC) 5 MG tablet Take 5 mg by mouth daily.    Marland Kitchen atorvastatin (LIPITOR) 40 MG tablet Take 40 mg by mouth daily.    . botulinum toxin Type A (BOTOX) 100 units SOLR injection Inject IM into the head and neck muscles every 3 months by provider in office  . Bromfenac Sodium (BROMSITE) 0.075 % SOLN INSTILL 1 DROP IN LEFT EYE TWICE DAILY  . butalbital-acetaminophen-caffeine (FIORICET, ESGIC) 50-325-40 MG tablet Take 1 tablet by mouth every 6 (six) hours as needed for headache.  . Calcium Carbonate-Vit D-Min (CALCIUM 1200 PO) Take 2 capsules by mouth daily.    . Cholecalciferol (VITAMIN D PO)  Take 1,000 Units by mouth daily.   . dorzolamide-timolol (COSOPT) 22.3-6.8 MG/ML ophthalmic solution Place 1 drop into the left eye 2 (two) times daily.   Marland Kitchen ezetimibe (ZETIA) 10 MG tablet Take 10 mg by mouth daily.    . famotidine (PEPCID) 20 MG tablet TAKE 1 TABLET BY MOUTH AT BEDTIME  . Fremanezumab-vfrm (AJOVY) 225 MG/1.5ML SOSY Inject 225 mg into the skin every 30 (thirty) days.  Marland Kitchen  frovatriptan (FROVA) 2.5 MG tablet Take 1 tablet (2.5 mg total) by mouth as needed for migraine. If recurs, may repeat after 2 hours. Max of 2 tabs in 24 hours.  Marland Kitchen leflunomide (ARAVA) 10 MG tablet Take 20 mg by mouth daily.   . Lifitegrast (XIIDRA) 5 % SOLN INSTILL 1 DROP INTO LEFT EYE TWICE DAILY  . LORazepam (ATIVAN) 0.5 MG tablet Take 0.5 mg by mouth daily as needed for anxiety or sleep.   . NONFORMULARY OR COMPOUNDED ITEM Inject 1 application into the skin every 30 (thirty) days.  . pantoprazole (PROTONIX) 40 MG tablet TAKE 1 TABLET(40 MG) BY MOUTH DAILY 30 TO 60 MINUTES BEFORE FIRST MEAL OF THE DAY  . phenytoin (DILANTIN) 300 MG ER capsule Take 1 capsule (300 mg total) by mouth daily. (Patient taking differently: Take 300 mg by mouth 3 (three) times daily. )  . prednisoLONE acetate (PRED FORTE) 1 % ophthalmic suspension Place 1-4 drops into both eyes daily. 6 DROPS INTO R EYE-- ONE DROP INTO L EYE  . promethazine (PHENERGAN) 25 MG tablet Take 25 mg by mouth every 6 (six) hours as needed for nausea or vomiting.   . SUMAtriptan (IMITREX) 6 MG/0.5ML SOLN injection Inject 0.5 mLs (6 mg total) into the skin as needed for migraine.  . timolol (TIMOPTIC) 0.5 % ophthalmic solution Place 1 drop into the left eye every other day.   . topiramate (TOPAMAX) 100 MG tablet TAKE 1 TABLET(100 MG) BY MOUTH AT BEDTIME (Patient taking differently: patient taking 25mg  at bedtime)  . zolmitriptan (ZOMIG) 5 MG nasal solution Place 1 spray into the nose as needed for migraine.                       Objective:    Physical Exam   amb wf nad   Wt 130 01/14/2011 > 08/27/2013  126 >  11/13/2013 125 > 04/22/2017  125 > 06/14/2017   127 > 10/11/2017  124   Vital signs reviewed - Note on arrival 02 sats  98% on RA and bp 140/ 90  Noted        HEENT: nl dentition, turbinates bilaterally, and oropharynx. Nl external ear canals without cough reflex   NECK :  without JVD/Nodes/TM/ nl carotid upstrokes bilaterally   LUNGS: no acc muscle use,  Nl contour chest with very minimal late insp crackles bilaterally    CV:  RRR  no s3 or murmur or increase in P2, and no edema   ABD:  soft and nontender with nl inspiratory excursion in the supine position. No bruits or organomegaly appreciated, bowel sounds nl  MS:  Nl gait/ ext warm without deformities, calf tenderness, cyanosis or clubbing Mild RA changes MCPs bilaterally   SKIN: warm and dry without lesions    NEURO:  alert, approp, nl sensorium with  no motor or cerebellar deficits apparent.           Assessment:

## 2017-10-13 ENCOUNTER — Other Ambulatory Visit: Payer: Self-pay | Admitting: *Deleted

## 2017-10-13 DIAGNOSIS — M810 Age-related osteoporosis without current pathological fracture: Secondary | ICD-10-CM

## 2017-10-13 NOTE — Telephone Encounter (Signed)
Patient advised and chose to go ahead with Reclast infusion. Orders placed and patient provided with number to call and make appointment.

## 2017-10-15 ENCOUNTER — Other Ambulatory Visit: Payer: Self-pay | Admitting: Internal Medicine

## 2017-10-15 DIAGNOSIS — R059 Cough, unspecified: Secondary | ICD-10-CM

## 2017-10-15 DIAGNOSIS — R05 Cough: Secondary | ICD-10-CM

## 2017-10-16 ENCOUNTER — Encounter: Payer: Self-pay | Admitting: Internal Medicine

## 2017-10-16 ENCOUNTER — Other Ambulatory Visit: Payer: Self-pay | Admitting: Internal Medicine

## 2017-10-16 DIAGNOSIS — R05 Cough: Secondary | ICD-10-CM

## 2017-10-16 DIAGNOSIS — R059 Cough, unspecified: Secondary | ICD-10-CM

## 2017-10-16 NOTE — Assessment & Plan Note (Signed)
-   Assoc with RA      - PFT's 06/15/2006 FEV1 (1.53)  Ratio 84 and DLC0 46 corrects to 90%     - PFT's  03/04/2011  FEV1 (1.73)  Ratio 83 and VC 2.11 and DLC0 63% corrects  94%     - PFT's 11/13/2013    VC 1.94 with DLCO 65 corrects to 102%     - 08/27/2013  Walked RA x 2 laps @ 185 ft each stopped due to  Oximeter stopped recording accurately but sats still 96% and no sob - PFT's  06/14/2017  FVC 1.71  (60 % ) ratio 89  p 5 % improvement from saba p nothing prior to study with DLCO  59 % corrects to 97 % for alv volume     Adequate control on present rx, reviewed in detail with pt > no change in rx needed  > f/u with pfts in 05/2018

## 2017-10-16 NOTE — Assessment & Plan Note (Signed)
-   Sinus CT  01/14/2011 > neg - Trial off fosfamax and on GERD rx 04/22/17 > improved 06/14/2017   Adequate control on present rx, reviewed in detail with pt > no change in rx needed  = gerd rx/ off oral biphosphonates

## 2017-10-19 ENCOUNTER — Telehealth: Payer: Self-pay | Admitting: Neurology

## 2017-10-19 NOTE — Telephone Encounter (Signed)
Pt called stating [employer] is requesting a letter stating why the pt will need to be on Ajovy. Pt unable to leave a phone #

## 2017-10-19 NOTE — Telephone Encounter (Signed)
Spoke with [employer]. They confirmed pt's insurance ID # and stated that it is $64 copay and the patient has picked up the last two doses. They are unaware of any issues.

## 2017-10-20 NOTE — Telephone Encounter (Signed)
Called pt & LVM asking for return call. Left office number in message.

## 2017-10-20 NOTE — Telephone Encounter (Addendum)
Patient also mentioned that she didn't start Aimovig because she already has repeated problems with constipation.

## 2017-10-20 NOTE — Telephone Encounter (Signed)
Ajovy 225mg /1.59ml pre-filled syringe solution for injection is denied for not meeting the prior authorization requirement(s). Medication authorization requires the following: (1) You have a trial and failure, contraindication, or intolerance to Aimovig [facility]); AND (2) the medication will not be used in combination with onabotulinumtoxinA (Botox).

## 2017-10-20 NOTE — Telephone Encounter (Signed)
I will call and check coverage of botox benefits.

## 2017-10-20 NOTE — Telephone Encounter (Signed)
Pt called and stated that since she switched to [facility] Medicare early March 2019 and they need reasons why patient needs all of her medications. She is asking what this will do regarding her Botox as well as Ajovy. She was given a dose for March for now.   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 However otherwise pt has been only getting about 1 migraine a month.  Arnetha Massy is working and so is Botox. She said that Botox helped some but the "Ajovy was just remarkable".   She has pressure and stitches in her eye so she needs migraine control.

## 2017-10-20 NOTE — Telephone Encounter (Signed)
I called and checked coverage for botox, it was approved [id number] (01/19/18).   I called the patient to make her aware that the botox was approved. She said that she was on the phone with [facility] earlier and they told her they Arnetha Massy was denied and we needed to file an appeal. They told the patient that what our office sent was "rejected". I also told the patient she would need to call [facility] so she could call and give consent for the botox.    Appeal info [phone] fax [phone].   Phone number to the pharmacy is [phone]

## 2017-10-24 NOTE — Telephone Encounter (Signed)
Let her know, give her the options. Unfortunately insurance won't approve both botox and ajovy

## 2017-10-24 NOTE — Telephone Encounter (Signed)
I think that is fine, we can give her some samples for a few months and when insurance sees she has not had botox maybe they will approve it. Can we give her 3 months? Cancel the next botox.

## 2017-10-24 NOTE — Telephone Encounter (Signed)
Spoke with the patient. Discussed that insurance will not be able to approve Ajovy & Botox at the same time. She has said that she will have to keep taking Ajovy as she can tell a difference since starting Ajovy.  Even with Botox and no Ajovy, she was still having bad migraines more often and even required an infusion. Pt says, "for me, the Ajovy has done better than the Botox". She also stated that she recalled in the past Dr. Lucia Gaskins had said that she felt the patient was coming to her limits with Botox. The migraines inevitably increase after her cornea surgeries which is going on now. This is her 6th surgery. However before the surgery she was only having a bad headache with vomiting every few weeks or once a month.   Pt asked if she makes the wrong decision and needs to start Botox again, can she go back on it. RN advised pt she will ask Duwayne Heck as she is not sure how often Botox can be approved in a year.

## 2017-10-25 MED ORDER — FREMANEZUMAB-VFRM 225 MG/1.5ML ~~LOC~~ SOSY: 225.0000 mg | PREFILLED_SYRINGE | SUBCUTANEOUS | 0 refills | Status: DC

## 2017-10-25 NOTE — Telephone Encounter (Signed)
Spoke with patient. Discussed that we can give her samples of Ajovy to use for now and see how she does with just Ajovy and no Botox. Then we can try to get insurance approval again. She verbalized appreciation and will come pickup the samples during office hours. She was also made aware that her botox appt was canceled. She had no further questions.

## 2017-10-25 NOTE — Addendum Note (Signed)
Addended by: Bertram Savin on: 10/25/2017 11:36 AM   Modules accepted: Orders

## 2017-10-25 NOTE — Telephone Encounter (Signed)
Noted, patients botox apt has been canceled. We can try for botox auth again later in the year if the patient fails ajovy. The current auth expires on 01/19/18.

## 2017-11-01 ENCOUNTER — Ambulatory Visit: Payer: BC Managed Care – PPO | Admitting: Neurology

## 2017-11-15 ENCOUNTER — Emergency Department (HOSPITAL_BASED_OUTPATIENT_CLINIC_OR_DEPARTMENT_OTHER)
Admission: EM | Admit: 2017-11-15 | Discharge: 2017-11-15 | Disposition: A | Discharge status: Home / Self Care | Payer: Medicare Other | Attending: Emergency Medicine | Admitting: Emergency Medicine

## 2017-11-15 ENCOUNTER — Other Ambulatory Visit: Payer: Self-pay

## 2017-11-15 ENCOUNTER — Encounter (HOSPITAL_BASED_OUTPATIENT_CLINIC_OR_DEPARTMENT_OTHER): Payer: Self-pay

## 2017-11-15 DIAGNOSIS — I10 Essential (primary) hypertension: Secondary | ICD-10-CM | POA: Diagnosis not present

## 2017-11-15 DIAGNOSIS — Z79899 Other long term (current) drug therapy: Secondary | ICD-10-CM | POA: Diagnosis not present

## 2017-11-15 DIAGNOSIS — Y999 Unspecified external cause status: Secondary | ICD-10-CM | POA: Insufficient documentation

## 2017-11-15 DIAGNOSIS — Z955 Presence of coronary angioplasty implant and graft: Secondary | ICD-10-CM | POA: Diagnosis not present

## 2017-11-15 DIAGNOSIS — Y929 Unspecified place or not applicable: Secondary | ICD-10-CM | POA: Diagnosis not present

## 2017-11-15 DIAGNOSIS — Z23 Encounter for immunization: Secondary | ICD-10-CM | POA: Insufficient documentation

## 2017-11-15 DIAGNOSIS — Y93G1 Activity, food preparation and clean up: Secondary | ICD-10-CM | POA: Insufficient documentation

## 2017-11-15 DIAGNOSIS — W260XXA Contact with knife, initial encounter: Secondary | ICD-10-CM | POA: Diagnosis not present

## 2017-11-15 DIAGNOSIS — Z87891 Personal history of nicotine dependence: Secondary | ICD-10-CM | POA: Insufficient documentation

## 2017-11-15 DIAGNOSIS — S61213A Laceration without foreign body of left middle finger without damage to nail, initial encounter: Secondary | ICD-10-CM | POA: Diagnosis not present

## 2017-11-15 DIAGNOSIS — S6992XA Unspecified injury of left wrist, hand and finger(s), initial encounter: Secondary | ICD-10-CM | POA: Diagnosis present

## 2017-11-15 MED ORDER — CEPHALEXIN 500 MG PO CAPS: 500.0000 mg | ORAL_CAPSULE | Freq: Four times a day (QID) | ORAL | 0 refills | Status: DC

## 2017-11-15 MED ORDER — TETANUS-DIPHTH-ACELL PERTUSSIS 5-2.5-18.5 LF-MCG/0.5 IM SUSP
0.5000 mL | Freq: Once | INTRAMUSCULAR | Status: AC
  Administered 2017-11-15: 0.5 mL via INTRAMUSCULAR
  Filled 2017-11-15: qty 0.5

## 2017-11-15 NOTE — Discharge Instructions (Signed)
Do not get your finger wet for the first 24 hours.  After that, do not submerge while your finger is healing.  Please see your doctor or return to emergency department if you develop any increasing pain, redness, swelling, drainage, red streaking up your finger.  Take Keflex as prescribed until completed.

## 2017-11-15 NOTE — ED Triage Notes (Signed)
Pt cut left middle finger with serrated knife approx 20 min PTA-lac noted-bleeding controlled-gauze/tape placed by EMT-pt NAD-steady gait

## 2017-11-16 NOTE — ED Provider Notes (Addendum)
[facility] EMERGENCY DEPARTMENT Provider Note   CSN: [id number] Arrival date & time: 11/15/17  1933     History   Chief Complaint Chief Complaint  Patient presents with  . Finger Injury    HPI Brianna [name] is a 67 y.o. female patient with history of hypertension, rheumatoid arthritis on Humira who presents with laceration to left middle finger.  She reports she was cutting celery with a serrated knife approximately 20 minutes prior to arrival.  Bleeding was controlled prior to arrival.  She denies any numbness or tingling.  She denies any other injury.  She has full range of motion of her digit.  Her tetanus is not up-to-date.  HPI  Past Medical History:  Diagnosis Date  . Carotid artery occlusion   . Epilepsy (HCC)   . Glaucoma   . Hyperlipidemia   . Hypertension   . Keratoconus of both eyes 1981  . Macular degeneration   . Migraines   . Raynaud's disease   . Retinal edema   . Rheumatoid arthritis(714.0)     Patient Active Problem List   Diagnosis Date Noted  . Trigger thumb, right thumb 10/05/2017  . History of macular degeneration 09/13/2016  . History of migraine 09/06/2016  . History of seizures 09/06/2016  . Iritis of right eye 05/12/2016  . Rheumatoid arthritis with positive rheumatoid factor (HCC) 05/11/2016  . ILD (interstitial lung disease) (HCC) 05/11/2016  . 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  . High risk medication use 05/11/2016  . Osteoporosis 05/11/2016  . 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 History of carotid endarterectomy 06/27/2014  . Migraine without aura 04/19/2013  . Seizures (HCC) 04/19/2013  . 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 Cough 01/14/2011  . Pulmonary fibrosis (HCC) 01/14/2011    Past Surgical History:  Procedure Laterality Date  . CAROTID ARTERY ANGIOPLASTY  2008   Dr Madilyn Fireman  . CAROTID ENDARTERECTOMY Left 11-15-2007  . cataract extraction Right 04-2014   [facility]  . CHOLECYSTECTOMY  1995  . CORNEAL TRANSPLANT     x 5 ; steroid inj. retnal information  . CORNEAL TRANSPLANT Right 01-22-2014   [facility]  . EYE SURGERY Right 12/2016   cornea repair   . EYE SURGERY Right 09/20/2017   cornea transplant   . GLAUCOMA SURGERY Right 2018     OB History   None      Home Medications    Prior to Admission medications   Medication Sig Start Date End Date Taking? Authorizing Provider  Adalimumab 40 MG/0.8ML PNKT Inject 40 mg into the skin every 14 (fourteen) days.  05/07/16   [provider]  ALPHAGAN P 0.1 % SOLN Place 1 drop into the left eye.  06/20/14   [provider]  amLODipine (NORVASC) 5 MG tablet Take 5 mg by mouth daily.      [provider]  atorvastatin (LIPITOR) 40 MG tablet Take 40 mg by mouth daily.      [provider]  botulinum toxin Type A (BOTOX) 100 units SOLR injection Inject IM into the head and neck muscles every 3 months by provider in office 02/26/16   Anson Fret, MD  Bromfenac Sodium (BROMSITE) 0.075 % SOLN INSTILL 1 DROP IN LEFT EYE TWICE DAILY 03/10/16   [provider]  butalbital-acetaminophen-caffeine (FIORICET, ESGIC) 50-325-40 MG tablet Take 1 tablet by mouth every 6 (six) hours as needed for headache. 09/29/15   Anson Fret, MD  Calcium Carbonate-Vit D-Min (CALCIUM 1200 PO) Take 2 capsules by mouth daily.      [provider]  cephALEXin (KEFLEX) 500 MG capsule Take 1 capsule (500 mg total) by mouth 4 (four) times daily. 11/15/17   Verlie Hellenbrand, Waylan Boga, PA-C  Cholecalciferol (VITAMIN D PO) Take 1,000 Units by mouth daily.     [provider]  dorzolamide-timolol (COSOPT) 22.3-6.8 MG/ML ophthalmic solution Place 1 drop into the left eye 2 (two) times daily.  10/08/13   [provider]  ezetimibe (ZETIA) 10 MG tablet Take 10 mg by mouth daily.      [provider]  famotidine (PEPCID) 20 MG tablet TAKE 1 TABLET BY MOUTH AT BEDTIME  10/17/17   Nyoka Cowden, MD  Fremanezumab-vfrm (AJOVY) 225 MG/1.5ML SOSY Inject 225 mg into the skin every 30 (thirty) days. 07/05/17   Anson Fret, MD  Fremanezumab-vfrm (AJOVY) 225 MG/1.5ML SOSY Inject 225 mg into the skin every 30 (thirty) days. 10/25/17   Anson Fret, MD  frovatriptan (FROVA) 2.5 MG tablet Take 1 tablet (2.5 mg total) by mouth as needed for migraine. If recurs, may repeat after 2 hours. Max of 2 tabs in 24 hours. 10/07/16   Anson Fret, MD  leflunomide (ARAVA) 10 MG tablet Take 20 mg by mouth daily.     [provider]  Lifitegrast Benay Spice) 5 % SOLN INSTILL 1 DROP INTO LEFT EYE TWICE DAILY 03/15/16   [provider]  LORazepam (ATIVAN) 0.5 MG tablet Take 0.5 mg by mouth daily as needed for anxiety or sleep.     [provider]  NONFORMULARY OR COMPOUNDED ITEM Inject 1 application into the skin every 30 (thirty) days. 04/21/17   Anson Fret, MD  pantoprazole (PROTONIX) 40 MG tablet TAKE 1 TABLET(40 MG) BY MOUTH DAILY 30 TO 60 MINUTES BEFORE FIRST MEAL OF THE DAY 10/17/17   Nyoka Cowden, MD  phenytoin (DILANTIN) 300 MG ER capsule Take 1 capsule (300 mg total) by mouth daily. Patient taking differently: Take 300 mg by mouth 3 (three) times daily.  07/25/17   Anson Fret, MD  prednisoLONE acetate (PRED FORTE) 1 % ophthalmic suspension Place 1-4 drops into both eyes daily. 6 DROPS INTO R EYE-- ONE DROP INTO L EYE 03/14/13   [provider]  promethazine (PHENERGAN) 25 MG tablet Take 25 mg by mouth every 6 (six) hours as needed for nausea or vomiting.     [provider]  SUMAtriptan (IMITREX) 6 MG/0.5ML SOLN injection Inject 0.5 mLs (6 mg total) into the skin as needed for migraine. 07/10/15   Anson Fret, MD  timolol (TIMOPTIC) 0.5 % ophthalmic solution Place 1 drop into the left eye every other day.  07/30/13   [provider]  topiramate (TOPAMAX) 100 MG tablet TAKE 1 TABLET(100 MG) BY MOUTH AT  BEDTIME Patient taking differently: patient taking 25mg  at bedtime 01/03/17   Anson Fret, MD  zolmitriptan (ZOMIG) 5 MG nasal solution Place 1 spray into the nose as needed for migraine. 12/25/15   Anson Fret, MD    Family History Family History  Problem Relation Age of Onset  . Heart disease Mother   . Lung disease Mother        ? disease process  . Uterine cancer Mother   . Heart disease Father   . Clotting disorder Father   . Collagen disease Father   . Cervical cancer Maternal Aunt   . Prostate cancer  Maternal Grandfather   . Rheum arthritis Sister   . High Cholesterol Sister   . Epilepsy Sister   . Rheum arthritis Maternal Grandmother   . High Cholesterol Brother   . High blood pressure Brother   . High blood pressure Brother   . High Cholesterol Brother   . High blood pressure Brother   . High Cholesterol Brother     Social History Social History   Tobacco Use  . Smoking status: Former Smoker    Packs/day: 0.50    Years: 14.00    Pack years: 7.00    Types: Cigarettes    Last attempt to quit: 07/19/1988    Years since quitting: 29.3  . Smokeless tobacco: Never Used  Substance Use Topics  . Alcohol use: No  . Drug use: No     Allergies   Morphine; Morphine and related; Codeine; Hydrocodone-acetaminophen; Sulfa antibiotics; and Sulfamethoxazole   Review of Systems Review of Systems  Constitutional: Negative for chills and fever.  HENT: Negative for facial swelling and sore throat.   Respiratory: Negative for shortness of breath.   Cardiovascular: Negative for chest pain.  Gastrointestinal: Negative for abdominal pain, nausea and vomiting.  Genitourinary: Negative for dysuria.  Musculoskeletal: Negative for back pain.  Skin: Positive for wound. Negative for rash.  Neurological: Negative for numbness and headaches.  Psychiatric/Behavioral: The patient is not nervous/anxious.      Physical Exam Updated Vital Signs BP (!) 187/89 (BP Location:  Right Arm)   Pulse 76   Temp 98.7 F (37.1 C) (Oral)   Resp 18   Ht 5\' 3"  (1.6 m)   Wt 58.1 kg (128 lb)   SpO2 99%   BMI 22.67 kg/m   Physical Exam  Constitutional: She appears well-developed and well-nourished. No distress.  HENT:  Head: Normocephalic and atraumatic.  Mouth/Throat: Oropharynx is clear and moist. No oropharyngeal exudate.  Eyes: Pupils are equal, round, and reactive to light. Conjunctivae are normal. Right eye exhibits no discharge. Left eye exhibits no discharge. No scleral icterus.  Neck: Normal range of motion. Neck supple. No thyromegaly present.  Cardiovascular: Normal rate, regular rhythm, normal heart sounds and intact distal pulses. Exam reveals no gallop and no friction rub.  No murmur heard. Pulmonary/Chest: Effort normal and breath sounds normal. No stridor. No respiratory distress. She has no wheezes. She has no rales.  Abdominal: Soft. Bowel sounds are normal. She exhibits no distension. There is no tenderness. There is no rebound and no guarding.  Musculoskeletal: She exhibits no edema.  Lymphadenopathy:    She has no cervical adenopathy.  Neurological: She is alert. Coordination normal.  Skin: Skin is warm and dry. No rash noted. She is not diaphoretic. No pallor.  Avulsion type laceration to  palmar aspect of the distal left middle finger, 2 to 3 mm meters deep loss, skin approximates completely, cap refill less than 2 seconds, sensation intact, full range of motion of the digit with flexion, extension at MCP, PIP, DIP  Psychiatric: She has a normal mood and affect.  Nursing note and vitals reviewed.    ED Treatments / Results  Labs (all labs ordered are listed, but only abnormal results are displayed) Labs Reviewed - No data to display  EKG None  Radiology No results found.  Procedures .Marland KitchenLaceration Repair Date/Time: 11/16/2017 1:33 AM Performed by: Emi Holes, PA-C Authorized by: Emi Holes, PA-C   Consent:    Consent  obtained:  Verbal   Consent given by:  Patient   Risks discussed:  Infection and pain   Alternatives discussed:  No treatment Anesthesia (see MAR for exact dosages):    Anesthesia method:  None Laceration details:    Location:  Finger   Finger location:  L long finger   Length (cm):  1.5   Laceration depth: 1-3 (at deepest) Repair type:    Repair type:  Simple Pre-procedure details:    Preparation:  Patient was prepped and draped in usual sterile fashion Exploration:    Wound exploration: wound explored through full range of motion and entire depth of wound probed and visualized     Wound extent: no foreign bodies/material noted, no muscle damage noted, no nerve damage noted and no tendon damage noted     Contaminated: no   Treatment:    Area cleansed with:  Saline and Betadine   Amount of cleaning:  Standard   Irrigation solution:  Sterile saline   Irrigation volume:  100   Irrigation method:  Syringe   Visualized foreign bodies/material removed: no   Skin repair:    Repair method:  Tissue adhesive Approximation:    Approximation:  Close Post-procedure details:    Dressing:  Splint for protection and non-adherent dressing   Patient tolerance of procedure:  Tolerated well, no immediate complications   (including critical care time)  Medications Ordered in ED Medications  Tdap (BOOSTRIX) injection 0.5 mL (0.5 mLs Intramuscular Given 11/15/17 2132)     Initial Impression / Assessment and Plan / ED Course  I have reviewed the triage vital signs and the nursing notes.  Pertinent labs & imaging results that were available during my care of the patient were reviewed by me and considered in my medical decision making (see chart for details).     Laceration repaired with Dermabond.  Patient placed in splint.  Tetanus updated in the ED.  Wound care discussed.  Considering patient has been advised on Humira, will initiate 5 days of Keflex for return precautions discussed.   Protective splint provided.  Suspect elevation of blood pressure due to distress.  Patient takes hypertension medication.  Patient denies any other complaints at this time. Patient understands and agrees with plan.  Patient discharged in satisfactory condition.    Final Clinical Impressions(s) / ED Diagnoses   Final diagnoses:  Laceration of left middle finger without foreign body without damage to nail, initial encounter    ED Discharge Orders        Ordered    cephALEXin (KEFLEX) 500 MG capsule  4 times daily     11/15/17 2237           Emi Holes, PA-C 11/16/17 0134    Alvira Monday, MD 11/19/17 1217

## 2017-11-23 DIAGNOSIS — S61213A Laceration without foreign body of left middle finger without damage to nail, initial encounter: Secondary | ICD-10-CM | POA: Insufficient documentation

## 2017-11-25 ENCOUNTER — Other Ambulatory Visit: Payer: Self-pay | Admitting: *Deleted

## 2017-11-25 NOTE — Progress Notes (Signed)
Infusion orders are current for patient CBC CMP Tylenol Benadryl appointments are up to date and follow up appointment.  

## 2017-11-29 ENCOUNTER — Other Ambulatory Visit (HOSPITAL_COMMUNITY): Payer: Self-pay | Admitting: *Deleted

## 2017-11-30 ENCOUNTER — Ambulatory Visit (HOSPITAL_COMMUNITY)
Admission: RE | Admit: 2017-11-30 | Discharge: 2017-11-30 | Disposition: A | Discharge status: Home / Self Care | Payer: Medicare Other | Source: Ambulatory Visit | Attending: Rheumatology | Admitting: Rheumatology

## 2017-11-30 DIAGNOSIS — M810 Age-related osteoporosis without current pathological fracture: Secondary | ICD-10-CM | POA: Insufficient documentation

## 2017-11-30 MED ORDER — ZOLEDRONIC ACID 5 MG/100ML IV SOLN
INTRAVENOUS | Status: AC
  Administered 2017-11-30: 5 mg via INTRAVENOUS
  Filled 2017-11-30: qty 100

## 2017-11-30 MED ORDER — ZOLEDRONIC ACID 5 MG/100ML IV SOLN
5.0000 mg | Freq: Once | INTRAVENOUS | Status: AC
  Administered 2017-11-30: 5 mg via INTRAVENOUS

## 2017-11-30 NOTE — Telephone Encounter (Signed)
Can we get her in for an appointment this week?

## 2017-11-30 NOTE — Discharge Instructions (Signed)

## 2017-11-30 NOTE — Telephone Encounter (Signed)
Pt called stating that even while taking Ajovy she has had increasing migraines for the past 2 weeks. Stating nothing helps and would like a call to discuss further.

## 2017-12-01 NOTE — Telephone Encounter (Signed)
Spoke with Dr. Lucia Gaskins, will restart pt on Botox and will get her in ASAP and can discuss plan during that appointment. Also if migraine comes back, pt can call to see if we can do an infusion. If she gets another migraine with those neurological deficits pt should go to ED.Marland Kitchen   Spoke with patient. Discussed that we will restart Botox and will get her in as soon as possible. Will have Danielle restart process. Looks like her approval was already granted through July 2019. Pt verbalized understanding and was under the impression that she had already paid [facility] for the last Botox that was not done. She will call them to find out. Also d/w patient that if migraine comes back, pt can call to see if we can do an infusion. However if she has those symptoms again, proceed to ED. Pt verbalized understanding.

## 2017-12-01 NOTE — Telephone Encounter (Addendum)
Spoke with patient. She stated that her migraines have gotten worse in the last 2 weeks. She hasn't had migraines like this since she was in her 41s and 30s. However this time she has had some other symptoms with them. She lost vision temporarily in her L eye on Monday, "like a shadow" was over it. She also stated that she noticed she had trouble unbuttoning her shirt with her L hand on Monday. The migraines however are on the L side and in the past they have been on the R. Those symptoms resolved. She stated that they are ranging from 7-10. She has taken Imitrex injections x 2 and has also tried Fioricet trying to avoid the Imitrex. She weaned off Topamax and around the same time she came off it the migraines started back however she stopped it because of the memory loss. Pt aware f/u will be needed in office soon. Will d/w Dr. Lucia Gaskins.

## 2017-12-27 ENCOUNTER — Encounter: Payer: Self-pay | Admitting: Neurology

## 2017-12-27 ENCOUNTER — Ambulatory Visit (INDEPENDENT_AMBULATORY_CARE_PROVIDER_SITE_OTHER): Payer: Medicare Other | Admitting: Neurology

## 2017-12-27 VITALS — BP 147/95 | HR 79

## 2017-12-27 DIAGNOSIS — G43719 Chronic migraine without aura, intractable, without status migrainosus: Secondary | ICD-10-CM | POA: Diagnosis not present

## 2017-12-27 NOTE — Progress Notes (Signed)
Botox- 100 units x 2 vials Lot: [id number] Expiration: 04/2020 NDC: 3664-4034-74  Bacteriostatic 0.9% Sodium Chloride- 54mL total Lot: [id number] Expiration: 11/17/2018 NDC: 8756-4332-95  Dx: J88.416 S/P

## 2017-12-27 NOTE — Progress Notes (Signed)

## 2017-12-28 MED ORDER — ERENUMAB-AOOE 140 MG/ML ~~LOC~~ SOAJ: 140.0000 mg | SUBCUTANEOUS | 11 refills | Status: DC

## 2018-01-10 ENCOUNTER — Other Ambulatory Visit: Payer: Self-pay | Admitting: Internal Medicine

## 2018-01-10 DIAGNOSIS — R05 Cough: Secondary | ICD-10-CM

## 2018-01-10 DIAGNOSIS — R059 Cough, unspecified: Secondary | ICD-10-CM

## 2018-01-12 ENCOUNTER — Other Ambulatory Visit: Payer: Self-pay | Admitting: Internal Medicine

## 2018-01-12 DIAGNOSIS — R059 Cough, unspecified: Secondary | ICD-10-CM

## 2018-01-12 DIAGNOSIS — R05 Cough: Secondary | ICD-10-CM

## 2018-02-16 DIAGNOSIS — H401123 Primary open-angle glaucoma, left eye, severe stage: Secondary | ICD-10-CM | POA: Insufficient documentation

## 2018-03-30 ENCOUNTER — Ambulatory Visit: Payer: Medicare Other | Admitting: Neurology

## 2018-04-04 ENCOUNTER — Telehealth: Payer: Self-pay | Admitting: Neurology

## 2018-04-04 NOTE — Telephone Encounter (Signed)
Patient is active and covered for Botox injections. I spoke with Toney Reil at [facility].   [id number]

## 2018-04-05 ENCOUNTER — Ambulatory Visit (INDEPENDENT_AMBULATORY_CARE_PROVIDER_SITE_OTHER): Payer: Medicare Other | Admitting: Neurology

## 2018-04-05 DIAGNOSIS — G43719 Chronic migraine without aura, intractable, without status migrainosus: Secondary | ICD-10-CM | POA: Diagnosis not present

## 2018-04-05 DIAGNOSIS — G43001 Migraine without aura, not intractable, with status migrainosus: Secondary | ICD-10-CM

## 2018-04-05 MED ORDER — PHENYTOIN SODIUM EXTENDED 300 MG PO CAPS: 300.0000 mg | ORAL_CAPSULE | Freq: Three times a day (TID) | ORAL | 4 refills | Status: DC

## 2018-04-05 MED ORDER — SUMATRIPTAN SUCCINATE 6 MG/0.5ML ~~LOC~~ SOLN: 6.0000 mg | SUBCUTANEOUS | 11 refills | Status: DC | PRN

## 2018-04-05 MED ORDER — BUTALBITAL-APAP-CAFFEINE 50-325-40 MG PO TABS: 1.0000 | ORAL_TABLET | Freq: Four times a day (QID) | ORAL | 4 refills | Status: AC | PRN

## 2018-04-05 MED ORDER — PROMETHAZINE HCL 25 MG PO TABS: 25.0000 mg | ORAL_TABLET | Freq: Four times a day (QID) | ORAL | 11 refills | Status: AC | PRN

## 2018-04-05 NOTE — Progress Notes (Signed)
Botox- 100 units x 2 vials Lot: [id number] Expiration: 09/2020 NDC: 9935-7017-79  Bacteriostatic 0.9% Sodium Chloride- 33mL total Lot: [id number] Expiration: 04/19/2019 NDC: 9233-0076-22  Dx: Q33.354 B/B

## 2018-04-05 NOTE — Progress Notes (Signed)
+ masseters 10u each. Not the eyes at all. +shoulders and LS.Next time consider lateral pterygoids. Not temples.    Consent Form Botulism Toxin Injection For Chronic Migraine    Reviewed orally with patient, additionally signature is on file:  Botulism toxin has been approved by the Federal drug administration for treatment of chronic migraine. Botulism toxin does not cure chronic migraine and it may not be effective in some patients.  The administration of botulism toxin is accomplished by injecting a small amount of toxin into the muscles of the neck and head. Dosage must be titrated for each individual. Any benefits resulting from botulism toxin tend to wear off after 3 months with a repeat injection required if benefit is to be maintained. Injections are usually done every 3-4 months with maximum effect peak achieved by about 2 or 3 weeks. Botulism toxin is expensive and you should be sure of what costs you will incur resulting from the injection.  The side effects of botulism toxin use for chronic migraine may include:   -Transient, and usually mild, facial weakness with facial injections  -Transient, and usually mild, head or neck weakness with head/neck injections  -Reduction or loss of forehead facial animation due to forehead muscle weakness  -Eyelid drooping  -Dry eye  -Pain at the site of injection or bruising at the site of injection  -Double vision  -Potential unknown long term risks  Contraindications: You should not have Botox if you are pregnant, nursing, allergic to albumin, have an infection, skin condition, or muscle weakness at the site of the injection, or have myasthenia gravis, Lambert-Eaton syndrome, or ALS.  It is also possible that as with any injection, there may be an allergic reaction or no effect from the medication. Reduced effectiveness after repeated injections is sometimes seen and rarely infection at the injection site may occur. All care will be taken  to prevent these side effects. If therapy is given over a long time, atrophy and wasting in the muscle injected may occur. Occasionally the patient's become refractory to treatment because they develop antibodies to the toxin. In this event, therapy needs to be modified.  I have read the above information and consent to the administration of botulism toxin.    BOTOX PROCEDURE NOTE FOR MIGRAINE HEADACHE    Contraindications and precautions discussed with patient(above). Aseptic procedure was observed and patient tolerated procedure. Procedure performed by Dr. Artemio Aly  The condition has existed for more than 6 months, and pt does not have a diagnosis of ALS, Myasthenia Gravis or Lambert-Eaton Syndrome.  Risks and benefits of injections discussed and pt agrees to proceed with the procedure.  Written consent obtained  These injections are medically necessary. Pt  receives good benefits from these injections. These injections do not cause sedations or hallucinations which the oral therapies may cause.  Indication/Diagnosis: chronic migraine BOTOX(J0585) injection was performed according to protocol by [employer]. 200 units of BOTOX was dissolved into 4 cc NS.   NDC: [address]-89   Description of procedure:  The patient was placed in a sitting position. The standard protocol was used for Botox as follows, with 5 units of Botox injected at each site:   -Procerus muscle, midline injection  -Corrugator muscle, bilateral injection  -Frontalis muscle, bilateral injection, with 2 sites each side, medial injection was performed in the upper one third of the frontalis muscle, in the region vertical from the medial inferior edge of the superior orbital rim. The lateral injection was again in the  upper one third of the forehead vertically above the lateral limbus of the cornea, 1.5 cm lateral to the medial injection site.  -Temporalis muscle injection, 4 sites, bilaterally. The first injection was  3 cm above the tragus of the ear, second injection site was 1.5 cm to 3 cm up from the first injection site in line with the tragus of the ear. The third injection site was 1.5-3 cm forward between the first 2 injection sites. The fourth injection site was 1.5 cm posterior to the second injection site.  -Occipitalis muscle injection, 3 sites, bilaterally. The first injection was done one half way between the occipital protuberance and the tip of the mastoid process behind the ear. The second injection site was done lateral and superior to the first, 1 fingerbreadth from the first injection. The third injection site was 1 fingerbreadth superiorly and medially from the first injection site.  -Cervical paraspinal muscle injection, 2 sites, bilateral knee first injection site was 1 cm from the midline of the cervical spine, 3 cm inferior to the lower border of the occipital protuberance. The second injection site was 1.5 cm superiorly and laterally to the first injection site.  -Trapezius muscle injection was performed at 3 sites, bilaterally. The first injection site was in the upper trapezius muscle halfway between the inflection point of the neck, and the acromion. The second injection site was one half way between the acromion and the first injection site. The third injection was done between the first injection site and the inflection point of the neck.   Will return for repeat injection in 3 months.   A 200 unit sof Botox was used, 155 units were injected, the rest of the Botox was wasted. The patient tolerated the procedure well, there were no complications of the above procedure.

## 2018-04-10 ENCOUNTER — Telehealth: Payer: Self-pay | Admitting: Neurology

## 2018-04-10 MED ORDER — PHENYTOIN SODIUM EXTENDED 100 MG PO CAPS: 300.0000 mg | ORAL_CAPSULE | Freq: Every day | ORAL | 3 refills | Status: DC

## 2018-04-10 NOTE — Telephone Encounter (Signed)
Patient was seen last Wednesday. Medications were sent for SUMAtriptan (IMITREX) 6 MG/0.5ML SOLN injection but she needs an auto injector sent and also generic phenytoin (DILANTIN) 300 MG ER capsule was sent and she needs brand name. She uses [facility] on [address] in [place].

## 2018-04-10 NOTE — Telephone Encounter (Signed)
That's fine please change thank you

## 2018-04-10 NOTE — Addendum Note (Signed)
Addended by: Bertram Savin on: 04/10/2018 05:10 PM   Modules accepted: Orders

## 2018-04-10 NOTE — Telephone Encounter (Signed)
That's fine

## 2018-04-10 NOTE — Telephone Encounter (Signed)
Spoke with patient. She stated that she has been taking Dilantin (brand only) 100 mg and she takes 3 capsules one a day. She has not been 300 mg TID. She is not asking for the ER either. She is aware that the pharmacy said the 100 mg caps are not ER and the 300 mg come in generic only. Pt aware that we will send in a new order for Dilantin 100 mg, 3 capsules daily (brand only) #270, refills 3 and cancel the 300 mg prescription.   Called pt's pharmacy and spoke with Natalia Leatherwood and gave verbal order for Dilantin (brand) 100 mg PO, 3 capsules daily. She verbalized understanding and appreciation. Also prescribed in computer.

## 2018-04-10 NOTE — Telephone Encounter (Signed)
Dr.Ahern gave v.o. to change to sumatriptan auto-injectors and brand name Dilantin ER 300 mg capsule. I called [facility] and spoke with Randa Evens and gave v.o. For Sumatriptan 6 mg auto-injectors #8, refills 11. Discussed the Diltantin. Randa Evens stated that pt has been picking up Dilantin (brand name & not ER) 100 mg capsule with instructions to take three capsules daily. RN was told that Dilantin 300 mg ER capsules are only available in generic. RN will d/w Dr. Lucia Gaskins and call pharmacy back.

## 2018-04-17 ENCOUNTER — Other Ambulatory Visit: Payer: Self-pay | Admitting: Orthopedic Surgery

## 2018-04-19 ENCOUNTER — Telehealth: Payer: Self-pay | Admitting: Rheumatology

## 2018-04-19 NOTE — Telephone Encounter (Signed)
Spoke with patient and advised that we are able to see lab work from appointment on 03/08/18 with Dr.Shah. Advised that the only thing completed was a cmp and we would need a cbc as well. Patient has not been seen in our office since March 2019. Patient did not have a follow up appointment scheduled until March 2020. Patient was due to be seen in August 2019. Patient has been scheduled for 05/10/18.

## 2018-04-19 NOTE — Telephone Encounter (Signed)
Patient called stating she has been getting her labwork done with Dr. Sherryll Burger at [facility].  Patient states he attached the lab results in her chart through care everywhere.  Patient is checking to make sure Dr. Corliss Skains is able to see the results.

## 2018-04-26 NOTE — Progress Notes (Deleted)
Office Visit Note  [name]             Date of Birth: [birth year]           MRN: [id number]             PCP: Pearson Grippe, MD Referring: Pearson Grippe, MD Visit Date: 05/10/2018 Occupation: @GUAROCC @  Subjective:  No chief complaint on file.   History of Present Illness: Brianna [name] is a 67 y.o. female ***   Activities of Daily Living:  Patient reports morning stiffness for *** {minute/hour:19697}.   Patient {ACTIONS;DENIES/REPORTS:21021675::"Denies"} nocturnal pain.  Difficulty dressing/grooming: {ACTIONS;DENIES/REPORTS:21021675::"Denies"} Difficulty climbing stairs: {ACTIONS;DENIES/REPORTS:21021675::"Denies"} Difficulty getting out of chair: {ACTIONS;DENIES/REPORTS:21021675::"Denies"} Difficulty using hands for taps, buttons, cutlery, and/or writing: {ACTIONS;DENIES/REPORTS:21021675::"Denies"}  No Rheumatology ROS completed.   PMFS History:  Patient Active Problem List   Diagnosis Date Noted  . Trigger thumb, right thumb 10/05/2017  . History of macular degeneration 09/13/2016  . History of migraine 09/06/2016  . History of seizures 09/06/2016  . Iritis of right eye 05/12/2016  . Rheumatoid arthritis with positive rheumatoid factor (HCC) 05/11/2016  . ILD (interstitial lung disease) (HCC) 05/11/2016  . 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  . High risk medication use 05/11/2016  . Osteoporosis 05/11/2016  . 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 History of carotid endarterectomy 06/27/2014  . Migraine without aura 04/19/2013  . Seizures (HCC) 04/19/2013  . 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 Cough 01/14/2011  . Pulmonary fibrosis (HCC) 01/14/2011    Past Medical History:  Diagnosis Date  . Carotid artery occlusion   . Epilepsy (HCC)   . Glaucoma   . Hyperlipidemia   . Hypertension   . Keratoconus of both eyes 1981  . Macular degeneration   . Migraines   . Raynaud's disease   . Retinal edema   .  Rheumatoid arthritis(714.0)     Family History  Problem Relation Age of Onset  . Heart disease Mother   . Lung disease Mother        ? disease process  . Uterine cancer Mother   . Heart disease Father   . Clotting disorder Father   . Collagen disease Father   . Cervical cancer Maternal Aunt   . Prostate cancer Maternal Grandfather   . Rheum arthritis Sister   . High Cholesterol Sister   . Epilepsy Sister   . Rheum arthritis Maternal Grandmother   . High Cholesterol Brother   . High blood pressure Brother   . High blood pressure Brother   . High Cholesterol Brother   . High blood pressure Brother   . High Cholesterol Brother    Past Surgical History:  Procedure Laterality Date  . CAROTID ARTERY ANGIOPLASTY  2008   Dr 2009  . CAROTID ENDARTERECTOMY Left 11-15-2007  . cataract extraction Right 04-2014   [facility]  . CHOLECYSTECTOMY  1995  . CORNEAL TRANSPLANT     x 5 ; steroid inj. retnal information  . CORNEAL TRANSPLANT Right 01-22-2014   [facility]  . EYE SURGERY Right 12/2016   cornea repair   . EYE SURGERY Right 09/20/2017   cornea transplant   . GLAUCOMA SURGERY Right 2018   Social History   Social History Narrative   Patient is married to 2019), has 2 children   Patient is right handed   Education level is Bachelor's degree   Caffeine consumption is 2 cups daily   Lives at home with husband  Objective: Vital Signs: There were no vitals taken for this visit.   Physical Exam   Musculoskeletal Exam: ***  CDAI Exam: CDAI Score: Not documented Patient Global Assessment: Not documented; Provider Global Assessment: Not documented Swollen: Not documented; Tender: Not documented Joint Exam   Not documented   There is currently no information documented on the homunculus. Go to the Rheumatology activity and complete the homunculus joint exam.  Investigation: No additional findings.  Imaging: No results found.  Recent Labs: Lab Results    Component Value Date   WBC 6.4 09/19/2017   HGB 14.2 09/19/2017   PLT 253 09/19/2017   NA 142 09/19/2017   K 5.2 09/19/2017   CL 105 09/19/2017   CO2 29 09/19/2017   GLUCOSE 91 09/19/2017   BUN 12 09/19/2017   CREATININE 0.61 09/19/2017   BILITOT 0.4 09/19/2017   ALKPHOS 110 02/10/2017   AST 31 09/19/2017   ALT 22 09/19/2017   PROT 7.6 09/19/2017   ALBUMIN 4.4 02/10/2017   CALCIUM 9.6 09/19/2017   GFRAA 111 09/19/2017    Speciality Comments: No specialty comments available.  Procedures:  No procedures performed Allergies: Morphine; Morphine and related; Alphagan [brimonidine]; Codeine; Dorzolamide hcl-timolol mal; Hydrocodone-acetaminophen; Sulfa antibiotics; and Sulfamethoxazole   Assessment / Plan:     Visit Diagnoses: Rheumatoid arthritis involving multiple sites with positive rheumatoid factor (HCC)  High risk medication use - Arava 20 mg daily and Humira 40 mg subcutaneous every other week.   ILD (interstitial lung disease) (HCC) - Followed by Dr. Sherene Sires  Pulmonary fibrosis [facility])  Iritis of right eye - Followed by corneal specialist  Age-related osteoporosis without current pathological fracture -  previously on Fosamax and Prolia. Her DEXA scan 02/07/2013 revealed a T score of -3.3.  Most recent DEXA on 10/08/2016 T score -3.5.recently started Reclast  DDD (degenerative disc disease), lumbar  History of glaucoma  History of macular degeneration  History of seizures  History of migraine  History of carotid endarterectomy  Trigger thumb, right thumb  Spondylosis of lumbar region without myelopathy or radiculopathy   Orders: No orders of the defined types were placed in this encounter.  No orders of the defined types were placed in this encounter.   Face-to-face time spent with patient was *** minutes. Greater than 50% of time was spent in counseling and coordination of care.  Follow-Up Instructions: No follow-ups on file.   Gearldine Bienenstock,  PA-C  Note - This record has been created using Dragon software.  Chart creation errors have been sought, but may not always  have been located. Such creation errors do not reflect on  the standard of medical care.

## 2018-05-08 ENCOUNTER — Other Ambulatory Visit: Payer: Self-pay

## 2018-05-08 ENCOUNTER — Encounter (HOSPITAL_BASED_OUTPATIENT_CLINIC_OR_DEPARTMENT_OTHER): Payer: Self-pay | Admitting: *Deleted

## 2018-05-10 ENCOUNTER — Ambulatory Visit: Payer: Self-pay | Admitting: Physician Assistant

## 2018-05-15 ENCOUNTER — Encounter (HOSPITAL_BASED_OUTPATIENT_CLINIC_OR_DEPARTMENT_OTHER)
Admission: RE | Admit: 2018-05-15 | Discharge: 2018-05-15 | Disposition: A | Discharge status: Home / Self Care | Payer: Medicare Other | Source: Ambulatory Visit | Attending: Orthopedic Surgery | Admitting: Orthopedic Surgery

## 2018-05-15 DIAGNOSIS — Z8673 Personal history of transient ischemic attack (TIA), and cerebral infarction without residual deficits: Secondary | ICD-10-CM | POA: Diagnosis not present

## 2018-05-15 DIAGNOSIS — E785 Hyperlipidemia, unspecified: Secondary | ICD-10-CM | POA: Diagnosis not present

## 2018-05-15 DIAGNOSIS — Z01810 Encounter for preprocedural cardiovascular examination: Secondary | ICD-10-CM

## 2018-05-15 DIAGNOSIS — M65311 Trigger thumb, right thumb: Secondary | ICD-10-CM | POA: Diagnosis not present

## 2018-05-15 DIAGNOSIS — G40909 Epilepsy, unspecified, not intractable, without status epilepticus: Secondary | ICD-10-CM | POA: Diagnosis not present

## 2018-05-15 DIAGNOSIS — Z8049 Family history of malignant neoplasm of other genital organs: Secondary | ICD-10-CM | POA: Diagnosis not present

## 2018-05-15 DIAGNOSIS — M069 Rheumatoid arthritis, unspecified: Secondary | ICD-10-CM | POA: Diagnosis not present

## 2018-05-15 DIAGNOSIS — I7300 Raynaud's syndrome without gangrene: Secondary | ICD-10-CM | POA: Diagnosis not present

## 2018-05-15 DIAGNOSIS — Z885 Allergy status to narcotic agent status: Secondary | ICD-10-CM | POA: Diagnosis not present

## 2018-05-15 DIAGNOSIS — Z886 Allergy status to analgesic agent status: Secondary | ICD-10-CM | POA: Diagnosis not present

## 2018-05-15 DIAGNOSIS — Z882 Allergy status to sulfonamides status: Secondary | ICD-10-CM | POA: Diagnosis not present

## 2018-05-15 DIAGNOSIS — R9431 Abnormal electrocardiogram [ECG] [EKG]: Secondary | ICD-10-CM | POA: Insufficient documentation

## 2018-05-15 DIAGNOSIS — Z8249 Family history of ischemic heart disease and other diseases of the circulatory system: Secondary | ICD-10-CM | POA: Diagnosis not present

## 2018-05-15 DIAGNOSIS — I10 Essential (primary) hypertension: Secondary | ICD-10-CM | POA: Insufficient documentation

## 2018-05-15 DIAGNOSIS — Z9861 Coronary angioplasty status: Secondary | ICD-10-CM | POA: Diagnosis not present

## 2018-05-15 DIAGNOSIS — Z947 Corneal transplant status: Secondary | ICD-10-CM | POA: Diagnosis not present

## 2018-05-15 DIAGNOSIS — Z87891 Personal history of nicotine dependence: Secondary | ICD-10-CM | POA: Diagnosis not present

## 2018-05-15 DIAGNOSIS — K219 Gastro-esophageal reflux disease without esophagitis: Secondary | ICD-10-CM | POA: Diagnosis not present

## 2018-05-16 ENCOUNTER — Ambulatory Visit (HOSPITAL_BASED_OUTPATIENT_CLINIC_OR_DEPARTMENT_OTHER)
Admission: RE | Admit: 2018-05-16 | Discharge: 2018-05-16 | Disposition: A | Discharge status: Home / Self Care | Payer: Medicare Other | Source: Ambulatory Visit | Attending: Orthopedic Surgery | Admitting: Orthopedic Surgery

## 2018-05-16 ENCOUNTER — Ambulatory Visit (HOSPITAL_BASED_OUTPATIENT_CLINIC_OR_DEPARTMENT_OTHER): Payer: Medicare Other | Admitting: Anesthesiology

## 2018-05-16 ENCOUNTER — Encounter (HOSPITAL_BASED_OUTPATIENT_CLINIC_OR_DEPARTMENT_OTHER): Payer: Self-pay | Admitting: Anesthesiology

## 2018-05-16 ENCOUNTER — Encounter (HOSPITAL_BASED_OUTPATIENT_CLINIC_OR_DEPARTMENT_OTHER)
Admission: RE | Disposition: A | Discharge status: Home / Self Care | Payer: Self-pay | Source: Ambulatory Visit | Attending: Orthopedic Surgery

## 2018-05-16 DIAGNOSIS — I7300 Raynaud's syndrome without gangrene: Secondary | ICD-10-CM | POA: Insufficient documentation

## 2018-05-16 DIAGNOSIS — M069 Rheumatoid arthritis, unspecified: Secondary | ICD-10-CM | POA: Diagnosis not present

## 2018-05-16 DIAGNOSIS — Z885 Allergy status to narcotic agent status: Secondary | ICD-10-CM | POA: Insufficient documentation

## 2018-05-16 DIAGNOSIS — I10 Essential (primary) hypertension: Secondary | ICD-10-CM | POA: Diagnosis not present

## 2018-05-16 DIAGNOSIS — Z8673 Personal history of transient ischemic attack (TIA), and cerebral infarction without residual deficits: Secondary | ICD-10-CM | POA: Insufficient documentation

## 2018-05-16 DIAGNOSIS — M65311 Trigger thumb, right thumb: Secondary | ICD-10-CM | POA: Insufficient documentation

## 2018-05-16 DIAGNOSIS — G40909 Epilepsy, unspecified, not intractable, without status epilepticus: Secondary | ICD-10-CM | POA: Insufficient documentation

## 2018-05-16 DIAGNOSIS — Z8049 Family history of malignant neoplasm of other genital organs: Secondary | ICD-10-CM | POA: Insufficient documentation

## 2018-05-16 DIAGNOSIS — K219 Gastro-esophageal reflux disease without esophagitis: Secondary | ICD-10-CM | POA: Insufficient documentation

## 2018-05-16 DIAGNOSIS — E785 Hyperlipidemia, unspecified: Secondary | ICD-10-CM | POA: Insufficient documentation

## 2018-05-16 DIAGNOSIS — Z8249 Family history of ischemic heart disease and other diseases of the circulatory system: Secondary | ICD-10-CM | POA: Insufficient documentation

## 2018-05-16 DIAGNOSIS — Z882 Allergy status to sulfonamides status: Secondary | ICD-10-CM | POA: Insufficient documentation

## 2018-05-16 DIAGNOSIS — Z9861 Coronary angioplasty status: Secondary | ICD-10-CM | POA: Insufficient documentation

## 2018-05-16 DIAGNOSIS — Z886 Allergy status to analgesic agent status: Secondary | ICD-10-CM | POA: Insufficient documentation

## 2018-05-16 DIAGNOSIS — Z87891 Personal history of nicotine dependence: Secondary | ICD-10-CM | POA: Insufficient documentation

## 2018-05-16 DIAGNOSIS — Z947 Corneal transplant status: Secondary | ICD-10-CM | POA: Insufficient documentation

## 2018-05-16 HISTORY — DX: Cerebral infarction, unspecified: I63.9

## 2018-05-16 HISTORY — DX: Gastro-esophageal reflux disease without esophagitis: K21.9

## 2018-05-16 HISTORY — DX: Peripheral vascular disease, unspecified: I73.9

## 2018-05-16 HISTORY — DX: Myoneural disorder, unspecified: G70.9

## 2018-05-16 HISTORY — PX: TRIGGER FINGER RELEASE: SHX641

## 2018-05-16 SURGERY — RELEASE, A1 PULLEY, FOR TRIGGER FINGER
Anesthesia: Regional | Site: Thumb | Laterality: Right

## 2018-05-16 MED ORDER — CEFAZOLIN SODIUM-DEXTROSE 2-4 GM/100ML-% IV SOLN
INTRAVENOUS | Status: AC
  Filled 2018-05-16: qty 100

## 2018-05-16 MED ORDER — CEFAZOLIN SODIUM-DEXTROSE 2-4 GM/100ML-% IV SOLN
2.0000 g | INTRAVENOUS | Status: AC
  Administered 2018-05-16: 2 g via INTRAVENOUS

## 2018-05-16 MED ORDER — CHLORHEXIDINE GLUCONATE 4 % EX LIQD: 60.0000 mL | Freq: Once | CUTANEOUS | Status: DC

## 2018-05-16 MED ORDER — FENTANYL CITRATE (PF) 100 MCG/2ML IJ SOLN
INTRAMUSCULAR | Status: AC
  Filled 2018-05-16: qty 2

## 2018-05-16 MED ORDER — PROPOFOL 10 MG/ML IV BOLUS
INTRAVENOUS | Status: AC
  Filled 2018-05-16: qty 20

## 2018-05-16 MED ORDER — ONDANSETRON HCL 4 MG/2ML IJ SOLN
INTRAMUSCULAR | Status: DC | PRN
  Administered 2018-05-16: 4 mg via INTRAVENOUS

## 2018-05-16 MED ORDER — MIDAZOLAM HCL 5 MG/5ML IJ SOLN
INTRAMUSCULAR | Status: DC | PRN
  Administered 2018-05-16: 1 mg via INTRAVENOUS

## 2018-05-16 MED ORDER — PROPOFOL 500 MG/50ML IV EMUL
INTRAVENOUS | Status: DC | PRN
  Administered 2018-05-16: 75 ug/kg/min via INTRAVENOUS

## 2018-05-16 MED ORDER — MIDAZOLAM HCL 2 MG/2ML IJ SOLN
INTRAMUSCULAR | Status: AC
  Filled 2018-05-16: qty 2

## 2018-05-16 MED ORDER — LIDOCAINE HCL (PF) 0.5 % IJ SOLN
INTRAMUSCULAR | Status: DC | PRN
  Administered 2018-05-16: 30 mL via INTRAVENOUS

## 2018-05-16 MED ORDER — MEPERIDINE HCL 25 MG/ML IJ SOLN: 6.2500 mg | INTRAMUSCULAR | Status: DC | PRN

## 2018-05-16 MED ORDER — ONDANSETRON HCL 4 MG/2ML IJ SOLN: 4.0000 mg | Freq: Once | INTRAMUSCULAR | Status: DC | PRN

## 2018-05-16 MED ORDER — LACTATED RINGERS IV SOLN
INTRAVENOUS | Status: DC
  Administered 2018-05-16: 10:00:00 via INTRAVENOUS

## 2018-05-16 MED ORDER — BUPIVACAINE HCL (PF) 0.5 % IJ SOLN
INTRAMUSCULAR | Status: DC | PRN
  Administered 2018-05-16: 4 mL

## 2018-05-16 MED ORDER — HYDROMORPHONE HCL 1 MG/ML IJ SOLN: 0.2500 mg | INTRAMUSCULAR | Status: DC | PRN

## 2018-05-16 MED ORDER — FENTANYL CITRATE (PF) 100 MCG/2ML IJ SOLN
INTRAMUSCULAR | Status: DC | PRN
  Administered 2018-05-16: 50 ug via INTRAVENOUS

## 2018-05-16 MED ORDER — ONDANSETRON HCL 4 MG/2ML IJ SOLN
INTRAMUSCULAR | Status: AC
  Filled 2018-05-16: qty 2

## 2018-05-16 MED ORDER — TRAMADOL HCL 50 MG PO TABS: 50.0000 mg | ORAL_TABLET | Freq: Four times a day (QID) | ORAL | 0 refills | Status: DC | PRN

## 2018-05-16 SURGICAL SUPPLY — 37 items
BANDAGE COBAN STERILE 2 (GAUZE/BANDAGES/DRESSINGS) ×3 IMPLANT
BLADE SURG 15 STRL LF DISP TIS (BLADE) ×1 IMPLANT
BLADE SURG 15 STRL SS (BLADE) ×3
BNDG CMPR 9X4 STRL LF SNTH (GAUZE/BANDAGES/DRESSINGS)
BNDG ESMARK 4X9 LF (GAUZE/BANDAGES/DRESSINGS) IMPLANT
CHLORAPREP W/TINT 26ML (MISCELLANEOUS) ×3 IMPLANT
CORD BIPOLAR FORCEPS 12FT (ELECTRODE) IMPLANT
COVER BACK TABLE 60X90IN (DRAPES) ×3 IMPLANT
COVER MAYO STAND STRL (DRAPES) ×3 IMPLANT
COVER WAND RF STERILE (DRAPES) IMPLANT
CUFF TOURNIQUET SINGLE 18IN (TOURNIQUET CUFF) ×2 IMPLANT
DECANTER SPIKE VIAL GLASS SM (MISCELLANEOUS) IMPLANT
DRAPE EXTREMITY T 121X128X90 (DRAPE) ×3 IMPLANT
DRAPE SURG 17X23 STRL (DRAPES) ×3 IMPLANT
GAUZE SPONGE 4X4 12PLY STRL (GAUZE/BANDAGES/DRESSINGS) ×3 IMPLANT
GAUZE XEROFORM 1X8 LF (GAUZE/BANDAGES/DRESSINGS) ×3 IMPLANT
GLOVE BIOGEL PI IND STRL 8 (GLOVE) IMPLANT
GLOVE BIOGEL PI IND STRL 8.5 (GLOVE) ×1 IMPLANT
GLOVE BIOGEL PI INDICATOR 8 (GLOVE) ×2
GLOVE BIOGEL PI INDICATOR 8.5 (GLOVE) ×2
GLOVE SURG ORTHO 8.0 STRL STRW (GLOVE) ×3 IMPLANT
GLOVE SURG SYN 8.0 (GLOVE) ×3 IMPLANT
GLOVE SURG SYN 8.0 PF PI (GLOVE) IMPLANT
GOWN STRL REIN XL XLG (GOWN DISPOSABLE) ×2 IMPLANT
GOWN STRL REUS W/ TWL LRG LVL3 (GOWN DISPOSABLE) ×1 IMPLANT
GOWN STRL REUS W/TWL LRG LVL3 (GOWN DISPOSABLE)
GOWN STRL REUS W/TWL XL LVL3 (GOWN DISPOSABLE) ×3 IMPLANT
NDL PRECISIONGLIDE 27X1.5 (NEEDLE) ×1 IMPLANT
NEEDLE PRECISIONGLIDE 27X1.5 (NEEDLE) ×3 IMPLANT
NS IRRIG 1000ML POUR BTL (IV SOLUTION) ×3 IMPLANT
PACK BASIN DAY SURGERY FS (CUSTOM PROCEDURE TRAY) ×3 IMPLANT
STOCKINETTE 4X48 STRL (DRAPES) ×3 IMPLANT
SUT ETHILON 4 0 PS 2 18 (SUTURE) ×3 IMPLANT
SYR BULB 3OZ (MISCELLANEOUS) ×3 IMPLANT
SYR CONTROL 10ML LL (SYRINGE) ×3 IMPLANT
TOWEL GREEN STERILE FF (TOWEL DISPOSABLE) ×6 IMPLANT
UNDERPAD 30X30 (UNDERPADS AND DIAPERS) ×3 IMPLANT

## 2018-05-16 NOTE — Discharge Instructions (Addendum)

## 2018-05-16 NOTE — Anesthesia Preprocedure Evaluation (Signed)
Anesthesia Evaluation  Patient identified by MRN, date of birth, ID band Patient awake    Reviewed: Allergy & Precautions, NPO status , Patient's Chart, lab work & pertinent test results  Airway Mallampati: I  TM Distance: >3 FB Neck ROM: Full    Dental   Pulmonary former smoker,    Pulmonary exam normal        Cardiovascular hypertension, Pt. on medications Normal cardiovascular exam     Neuro/Psych Seizures -,  CVA    GI/Hepatic GERD  Medicated and Controlled,  Endo/Other    Renal/GU      Musculoskeletal   Abdominal   Peds  Hematology   Anesthesia Other Findings   Reproductive/Obstetrics                             Anesthesia Physical Anesthesia Plan  ASA: II  Anesthesia Plan: Bier Block and Bier Block-LIDOCAINE ONLY   Post-op Pain Management:    Induction: Intravenous  PONV Risk Score and Plan: 2 and Ondansetron and Treatment may vary due to age or medical condition  Airway Management Planned: Simple Face Mask  Additional Equipment:   Intra-op Plan:   Post-operative Plan:   Informed Consent: I have reviewed the patients History and Physical, chart, labs and discussed the procedure including the risks, benefits and alternatives for the proposed anesthesia with the patient or authorized representative who has indicated his/her understanding and acceptance.     Plan Discussed with: CRNA and Surgeon  Anesthesia Plan Comments:         Anesthesia Quick Evaluation

## 2018-05-16 NOTE — Op Note (Signed)
NAME: Brianna [name] MEDICAL RECORD NO: [id number] DATE OF BIRTH: 07-16-1951 FACILITY: [facility] LOCATION: [facility] PHYSICIAN: Nicki Reaper, MD   OPERATIVE REPORT   DATE OF PROCEDURE: 05/16/18    PREOPERATIVE DIAGNOSIS:   Stenosing tenosynovitis right thumb   POSTOPERATIVE DIAGNOSIS:   Same   PROCEDURE:  Release A1 pulley right thumb   SURGEON: Cindee Salt, M.D.   ASSISTANT: none   ANESTHESIA:  Regional with sedation and Local   INTRAVENOUS FLUIDS:  Per anesthesia flow sheet.   ESTIMATED BLOOD LOSS:  Minimal.   COMPLICATIONS:  None.   SPECIMENS:  none   TOURNIQUET TIME:    Total Tourniquet Time Documented: Forearm (Right) - 17 minutes Total: Forearm (Right) - 17 minutes    DISPOSITION:  Stable to PACU.   INDICATIONS: She is a 67 year old female with a history of triggering of her right thumb that has not responded to conservative treatment including at least 2 injections.  She has elected undergo release of the A1 pulley.  This been done on the opposite side.  Pre-peri-and postoperative course been discussed along with risk applications.  She is aware that there is no guarantee to the surgery the possibility of infection recurrence injury to arteries nerves tendons complete relief symptoms and dystrophy.  Preoperative area the patient is seen extremity marked by both patient and surgeon antibiotic given  OPERATIVE COURSE: She is brought to the operating room where a forearm-based IV regional anesthetic was carried out without difficulty under the direction the anesthesia department.  She was prepped using ChloraPrep in the supine position with the right arm free.  A three-minute dry time was allowed timeout taken to confirm patient procedure.  A transverse incision was made over the metacarpal phalangeal joint crease of the right thumb carried down through subcutaneous tissue.  Bleeders were electrocauterized necessary with bipolar.  The radial and ulnar  digital neurovascular bundles were identified protected.  Retractors were placed.  The A1 pulley was found to be thickened.  Moderate synovial tissue proliferation was present about this.  With blunt sharp dissection the A1 pulley was then released on its radial aspect protecting the oblique pulley.  The tenosynovial tissue proximally was separated with blunt dissection.  The thumb placed through full range of motion.  No further trigger was noted.  Wound was copiously irrigated with saline.  The skin was closed interrupted 4-0 nylon sutures.  Local infiltration with half percent bupivacaine without epinephrine was given approximately 4 to 5 cc was used.  Sterile compressive dressing with the fingers free with was applied.  Inflation of the tourniquet all fingers immediately pink.  She was taken to the recovery room for observation in satisfactory condition.  She will be discharged home to return Tonny Bollman agrees were in 1 week on Tylenol and ibuprofen with Ultram for breakthrough.   Cindee Salt, MD Electronically signed, 05/16/18

## 2018-05-16 NOTE — Brief Op Note (Signed)
05/16/2018  10:05 AM  PATIENT:  Brianna [name]  67 y.o. female  PRE-OPERATIVE DIAGNOSIS:  RIGHT THUMB TRIGGER FINGER  POST-OPERATIVE DIAGNOSIS:  RIGHT THUMB TRIGGER FINGER  PROCEDURE:  Procedure(s): RELEASE TRIGGER FINGER/A-1 PULLEY RIGHT THUMB (Right)  SURGEON:  Surgeon(s) and Role:    * Brianna Salt, MD - Primary  PHYSICIAN ASSISTANT:   ASSISTANTS: none   ANESTHESIA:   local, regional and IV sedation  EBL:  75ml BLOOD ADMINISTERED:none  DRAINS: none   LOCAL MEDICATIONS USED:  BUPIVICAINE   SPECIMEN:  No Specimen  DISPOSITION OF SPECIMEN:  N/A  COUNTS:  YES  TOURNIQUET:   Total Tourniquet Time Documented: Forearm (Right) - 17 minutes Total: Forearm (Right) - 17 minutes   DICTATION: .Brianna [name] Dictation  PLAN OF CARE: Discharge to home after PACU  PATIENT DISPOSITION:  PACU - hemodynamically stable.

## 2018-05-16 NOTE — Transfer of Care (Signed)
Immediate Anesthesia Transfer of Care Note  [name]  Procedure(s) Performed: RELEASE TRIGGER FINGER/A-1 PULLEY RIGHT THUMB (Right Thumb)  Patient Location: PACU  Anesthesia Type:Bier block  Level of Consciousness: awake, alert  and oriented  Airway & Oxygen Therapy: Patient Spontanous Breathing and Patient connected to face mask oxygen  Post-op Assessment: Report given to RN and Post -op Vital signs reviewed and stable  Post vital signs: Reviewed and stable  Last Vitals:  Vitals Value Taken Time  BP 159/92 05/16/2018 10:01 AM  Temp    Pulse 70 05/16/2018 10:02 AM  Resp 9 05/16/2018 10:02 AM  SpO2 96 % 05/16/2018 10:02 AM  Vitals shown include unvalidated device data.  Last Pain:  Vitals:   05/16/18 0832  TempSrc: Oral  PainSc: 0-No pain         Complications: No apparent anesthesia complications

## 2018-05-16 NOTE — H&P (Signed)
Brianna [name] is an 67 y.o. female.   Chief Complaint: catching right thumbHPI: Brianna [name] is a 67yo female with trigger thumbon her right side. S States that the trigger began approximately 8 months ago after doing a large amount of cutting. She complains of discomfort in the DIP joint of her thumb with catching with pain at the metacarpal phalangeal joint. She states heat helps. It is worse in the morning gets better during the day and then is returns in the afternoon pain is moderate in nature only when it catches. She is not complaining of any numbness or tingling. She has not tried taking anything for this. She has no history of diabetes thyroid problems or gout. Family history is positive for gout negative for diabetes thyroid problems and arthritis. She does have a history of rheumatoid arthritis. She has had a second injection to the A1 pulley of her right thumb.    Past Medical History:  Diagnosis Date  . Carotid artery occlusion   . Epilepsy (HCC)   . GERD (gastroesophageal reflux disease)   . Glaucoma   . Hyperlipidemia   . Hypertension   . Keratoconus of both eyes 1981  . Macular degeneration   . Migraines   . Neuromuscular disorder (HCC)    RA  . Peripheral vascular disease (HCC)    carotid occlusion surgery on left  . Raynaud's disease   . Retinal edema   . Rheumatoid arthritis(714.0)   . Stroke [facility])    two mild strokes presumed from left carotid stenosis    Past Surgical History:  Procedure Laterality Date  . CAROTID ARTERY ANGIOPLASTY  2008   Dr Madilyn Fireman  . CAROTID ENDARTERECTOMY Left 11-15-2007  . cataract extraction Right 04-2014   [facility]  . CHOLECYSTECTOMY  1995  . CORNEAL TRANSPLANT     x 5 ; steroid inj. retnal information  . 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 EYE SURGERY Right 12/2016   cornea repair   . EYE SURGERY Right 09/20/2017   cornea transplant   . GLAUCOMA SURGERY Right 2018    Family History  Problem Relation Age of  Onset  . Heart disease Mother   . Lung disease Mother        ? disease process  . Uterine cancer Mother   . Heart disease Father   . Clotting disorder Father   . Collagen disease Father   . Cervical cancer Maternal Aunt   . Prostate cancer Maternal Grandfather   . Rheum arthritis Sister   . High Cholesterol Sister   . Epilepsy Sister   . Rheum arthritis Maternal Grandmother   . High Cholesterol Brother   . High blood pressure Brother   . High blood pressure Brother   . High Cholesterol Brother   . High blood pressure Brother   . High Cholesterol Brother    Social History:  reports that she quit smoking about 29 years ago. Her smoking use included cigarettes. She has a 7.00 pack-year smoking history. She has never used smokeless tobacco. She reports that she does not drink alcohol or use drugs.  Allergies:  Allergies  Allergen Reactions  . Morphine Other (See Comments)    Difficulty breathing  . Morphine And Related Shortness Of Breath  . Alphagan [Brimonidine]     Eyelid swelling, scratching Allergic to preservative contained in this med  . Codeine Nausea And Vomiting  . Dorzolamide Hcl-Timolol Mal     Inflammation of the eyelid, scratching  Allergic to preservative contained in this med  . Hydrocodone-Acetaminophen Nausea And Vomiting    But tolerates tylenol  . Sulfa Antibiotics Rash    "Large bumps"  . Sulfamethoxazole Rash    No medications prior to admission.    No results found for this or any previous visit (from the past 48 hour(s)).  No results found.   Pertinent items are noted in HPI.  Height 5\' 2"  (1.575 m), weight 55.8 kg.  General appearance: alert, cooperative and appears stated age Head: Normocephalic, without obvious abnormality Neck: no JVD Resp: clear to auscultation bilaterally Cardio: regular rate and rhythm, S1, S2 normal, no murmur, click, rub or gallop GI: soft, non-tender; bowel sounds normal; no masses,  no  organomegaly Extremities: Catching right thumb Pulses: 2+ and symmetric Skin: Skin color, texture, turgor normal. No rashes or lesions Neurologic: Grossly normal Incision/Wound: na  Assessment/Plan Trigger thumb right Plan release A1 pulley right thumb.  Pre-peri-and postoperative course been discussed along with risks and complications.  She is aware there is no guarantee to the surgery the possibility of infection recurrence injury to arteries nerves tendons complete relief of symptoms and dystrophy. 05/16/2018, 4:13 AM

## 2018-05-16 NOTE — Anesthesia Postprocedure Evaluation (Signed)
Anesthesia Post Note  Patient: WEAVER TWEED  Procedure(s) Performed: RELEASE TRIGGER FINGER/A-1 PULLEY RIGHT THUMB (Right Thumb)     Patient location during evaluation: PACU Anesthesia Type: Bier Block and MAC Level of consciousness: awake and alert Pain management: pain level controlled Vital Signs Assessment: post-procedure vital signs reviewed and stable Respiratory status: spontaneous breathing, nonlabored ventilation, respiratory function stable and patient connected to nasal cannula oxygen Cardiovascular status: stable and blood pressure returned to baseline Postop Assessment: no apparent nausea or vomiting Anesthetic complications: no    Last Vitals:  Vitals:   05/16/18 1001 05/16/18 1015  BP: (!) 159/92 (!) 166/96  Pulse: 71   Resp: 12   Temp: 36.7 C 36.8 C  SpO2: 95%     Last Pain:  Vitals:   05/16/18 1015  TempSrc:   PainSc: 0-No pain                 Chiquita Heckert

## 2018-05-17 ENCOUNTER — Encounter (HOSPITAL_BASED_OUTPATIENT_CLINIC_OR_DEPARTMENT_OTHER): Payer: Self-pay | Admitting: Orthopedic Surgery

## 2018-05-17 NOTE — Progress Notes (Signed)
Office Visit Note  [name]             Date of Birth: [birth year]           MRN: [id number]             PCP: Pearson Grippe, MD Referring: Pearson Grippe, MD Visit Date: 05/31/2018 Occupation: @GUAROCC @  Subjective:  Recurrent iritis.   History of Present Illness: Brianna [name] is a 67 y.o. female  with history of seropositive rheumatoid arthritis, ILD, osteoporosis, and DDD of lumbar spine.  According to patient she has been followed by Dr. Sherryll Burger at [facility].  She has been getting recurrent iritis.  She was started on cyclosporine last week.  She is currently taking cyclosporine 100 mg twice daily along with Arava and Humira.  She is also using prednisone eyedrops.  She has no joint pain or inflammation at this point.  She had right thumb trigger finger release and is better now.  Activities of Daily Living:  Patient reports morning stiffness for 15 minutes.   Patient Denies nocturnal pain.  Difficulty dressing/grooming: Denies Difficulty climbing stairs: Denies Difficulty getting out of chair: Denies Difficulty using hands for taps, buttons, cutlery, and/or writing: Denies  Review of Systems  Constitutional: Positive for fatigue. Negative for night sweats, weight gain and weight loss.  HENT: Negative for mouth sores, trouble swallowing, trouble swallowing, mouth dryness and nose dryness.   Eyes: Positive for redness and dryness. Negative for pain and visual disturbance.  Respiratory: Negative for cough, shortness of breath and difficulty breathing.   Cardiovascular: Negative for chest pain, palpitations, hypertension, irregular heartbeat and swelling in legs/feet.  Gastrointestinal: Negative for blood in stool, constipation and diarrhea.  Endocrine: Negative for increased urination.  Genitourinary: Negative for vaginal dryness.  Musculoskeletal: Positive for morning stiffness. Negative for arthralgias, joint pain, joint swelling, myalgias, muscle weakness,  muscle tenderness and myalgias.  Skin: Negative for color change, rash, hair loss, skin tightness, ulcers and sensitivity to sunlight.  Allergic/Immunologic: Negative for susceptible to infections.  Neurological: Negative for dizziness, memory loss, night sweats and weakness.  Hematological: Negative for swollen glands.  Psychiatric/Behavioral: Negative for depressed mood and sleep disturbance. The patient is not nervous/anxious.     PMFS History:  Patient Active Problem List   Diagnosis Date Noted  . URI, acute 05/19/2018  . Cerumen impaction 05/19/2018  . Trigger thumb, right thumb 10/05/2017  . History of macular degeneration 09/13/2016  . History of migraine 09/06/2016  . History of seizures 09/06/2016  . Iritis of right eye 05/12/2016  . Rheumatoid arthritis with positive rheumatoid factor (HCC) 05/11/2016  . ILD (interstitial lung disease) (HCC) 05/11/2016  . 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  . High risk medication use 05/11/2016  . Osteoporosis 05/11/2016  . 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 History of carotid endarterectomy 06/27/2014  . Migraine without aura 04/19/2013  . Seizures (HCC) 04/19/2013  . 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 Cough 01/14/2011  . Pulmonary fibrosis (HCC) 01/14/2011    Past Medical History:  Diagnosis Date  . Carotid artery occlusion   . Epilepsy (HCC)   . GERD (gastroesophageal reflux disease)   . Glaucoma   . Hyperlipidemia   . Hypertension   . Keratoconus of both eyes 1981  . Macular degeneration   . Migraines   . Neuromuscular disorder (HCC)    RA  . Peripheral vascular disease (HCC)    carotid occlusion surgery on left  .  Raynaud's disease   . Retinal edema   . Rheumatoid arthritis(714.0)   . Stroke [facility])    two mild strokes presumed from left carotid stenosis    Family History  Problem Relation Age of Onset  . Heart disease Mother   . Lung disease Mother        ? disease  process  . Uterine cancer Mother   . Heart disease Father   . Clotting disorder Father   . Collagen disease Father   . Cervical cancer Maternal Aunt   . Prostate cancer Maternal Grandfather   . Rheum arthritis Sister   . High Cholesterol Sister   . Epilepsy Sister   . Rheum arthritis Maternal Grandmother   . High Cholesterol Brother   . High blood pressure Brother   . High blood pressure Brother   . High Cholesterol Brother   . High blood pressure Brother   . High Cholesterol Brother    Past Surgical History:  Procedure Laterality Date  . CAROTID ARTERY ANGIOPLASTY  2008   Dr Madilyn Fireman  . CAROTID ENDARTERECTOMY Left 11-15-2007  . cataract extraction Right 04-2014   [facility]  . CHOLECYSTECTOMY  1995  . CORNEAL TRANSPLANT     x 5 ; steroid inj. retnal information  . CORNEAL TRANSPLANT Right 01-22-2014   [facility]  . EYE SURGERY Right 12/2016   cornea repair   . EYE SURGERY Right 09/20/2017   cornea transplant   . GLAUCOMA SURGERY Right 2018  . TRIGGER FINGER RELEASE Right 05/16/2018   Procedure: RELEASE TRIGGER FINGER/A-1 PULLEY RIGHT THUMB;  Surgeon: Cindee Salt, MD;  Location: [facility];  Service: Orthopedics;  Laterality: Right;   Social History   Social History Narrative   Patient is married to [name]), has 2 children   Patient is right handed   Education level is Bachelor's degree   Caffeine consumption is 2 cups daily   Lives at home with husband    Immunization History  Administered Date(s) Administered  . Influenza Split 04/26/2013, 04/08/2017  . Influenza, High Dose Seasonal PF 04/09/2018  . Pneumococcal Conjugate-13 08/27/2013  . Pneumococcal Polysaccharide-23 01/14/2011  . Tdap 11/15/2017    Objective: Vital Signs: BP (!) 165/87 (BP Location: Left Arm, Patient Position: Sitting, Cuff Size: Small)   Pulse 75   Resp 14   Ht 5\' 2"  (1.575 m)   Wt 127 lb (57.6 kg)   BMI 23.23 kg/m    Physical Exam  Constitutional: She is oriented  to person, place, and time. She appears well-developed and well-nourished.  HENT:  Head: Normocephalic and atraumatic.  Eyes: Conjunctivae and EOM are normal.  Neck: Normal range of motion.  Cardiovascular: Normal rate, regular rhythm, normal heart sounds and intact distal pulses.  Pulmonary/Chest: Effort normal and breath sounds normal.  Abdominal: Soft. Bowel sounds are normal.  Lymphadenopathy:    She has no cervical adenopathy.  Neurological: She is alert and oriented to person, place, and time.  Skin: Skin is warm and dry. Capillary refill takes less than 2 seconds.  Psychiatric: She has a normal mood and affect. Her behavior is normal.  Nursing note and vitals reviewed.    Musculoskeletal Exam: C-spine thoracic lumbar spine good range of motion.  She has some discomfort range of motion of the lumbar spine.  Shoulder joints elbow joints wrist joint MCPs PIPs DIPs were in good range of motion.  She has some DIP and PIP thickening due to underlying osteoarthritis.  No synovitis was  noted.  Hip joints knee joints ankles MTPs PIPs been good range of motion with no synovitis.  CDAI Exam: CDAI Score: 0.2  Patient Global Assessment: 1 (mm); Provider Global Assessment: 1 (mm) Swollen: 0 ; Tender: 0  Joint Exam   Not documented   There is currently no information documented on the homunculus. Go to the Rheumatology activity and complete the homunculus joint exam.  Investigation: No additional findings.  Imaging: No results found.  Recent Labs: Lab Results  Component Value Date   WBC 6.4 09/19/2017   HGB 14.2 09/19/2017   PLT 253 09/19/2017   NA 142 09/19/2017   K 5.2 09/19/2017   CL 105 09/19/2017   CO2 29 09/19/2017   GLUCOSE 91 09/19/2017   BUN 12 09/19/2017   CREATININE 0.61 09/19/2017   BILITOT 0.4 09/19/2017   ALKPHOS 110 02/10/2017   AST 31 09/19/2017   ALT 22 09/19/2017   PROT 7.6 09/19/2017   ALBUMIN 4.4 02/10/2017   CALCIUM 9.6 09/19/2017   GFRAA 111  09/19/2017    Speciality Comments: Last Reclast 11/30/2017   Procedures:  No procedures performed Allergies: Morphine; Morphine and related; Alphagan [brimonidine]; Codeine; Dorzolamide hcl-timolol mal; Hydrocodone-acetaminophen; Sulfa antibiotics; and Sulfamethoxazole   Assessment / Plan:     Visit Diagnoses: Rheumatoid arthritis involving multiple sites with positive rheumatoid factor (HCC)-patient has no active synovitis.  Her arthritis is very well controlled.  Although she continues to have recurrent iritis.  High risk medication use - Arava 20 mg daily, Cyclosporin 50 mg 2 tabs po bid (added last week per patient) and Humira 40 mg subcutaneous every other week. Last TB gold negative 05/02/17.  Most recent CBC/CMP from [facility] within normal limits on 03/08/18.  Due for CBC/CMP/TB gold today to monitor for drug toxicity and then CBC/CMP every 3 months.  Recommend annual flu and Shingrix vaccine. - Plan: CBC with Differential/Platelet, COMPLETE METABOLIC PANEL WITH GFR, QuantiFERON-TB Gold Plus  ILD (interstitial lung disease) (HCC) -patient is followed by Dr. Sherene Sires  Iritis of right eye-she is seeing Dr. Sherryll Burger and was recently started on cyclosporine along with Arava and Humira.  She is still using prednisone eyedrops.  Have advised her to follow-up with Dr. Sherryll Burger closely.  DDD (degenerative disc disease), lumbar-she has mild lower back discomfort.  Age-related osteoporosis without current pathological fracture- on Reclast IV,previously on Fosamax and Prolia.Declined  Her DEXA scan 02/07/2013 revealed a T score of -3.3.  Her most recent DEXA on 10/08/2016 T score -3.5.  Her last Reclast infusion was Nov 30, 2017.  Trigger thumb, right thumb -she had trigger finger release by Dr. Merlyn Lot which is doing better.  Other medical problems are listed as follows:  History of macular degeneration  History of glaucoma  History of seizures  History of migraine  History of carotid endarterectomy    Orders: Orders Placed This Encounter  Procedures  . CBC with Differential/Platelet  . COMPLETE METABOLIC PANEL WITH GFR  . QuantiFERON-TB Gold Plus   No orders of the defined types were placed in this encounter.   Face-to-face time spent with patient was 30 minutes. Greater than 50% of time was spent in counseling and coordination of care.  Follow-Up Instructions: Return in about 3 months (around 08/31/2018) for Rheumatoid arthritis, ILD,OP.   Pollyann Savoy, MD  Note - This record has been created using Animal nutritionist.  Chart creation errors have been sought, but may not always  have been located. Such creation errors do not reflect on  the standard of medical care. 

## 2018-05-19 ENCOUNTER — Encounter: Payer: Self-pay | Admitting: Primary Care

## 2018-05-19 ENCOUNTER — Ambulatory Visit: Payer: Medicare Other | Admitting: Primary Care

## 2018-05-19 VITALS — BP 154/98 | HR 78 | Temp 98.1°F | Ht 62.0 in | Wt 128.2 lb

## 2018-05-19 DIAGNOSIS — H6123 Impacted cerumen, bilateral: Secondary | ICD-10-CM

## 2018-05-19 DIAGNOSIS — H6120 Impacted cerumen, unspecified ear: Secondary | ICD-10-CM | POA: Insufficient documentation

## 2018-05-19 DIAGNOSIS — J069 Acute upper respiratory infection, unspecified: Secondary | ICD-10-CM | POA: Diagnosis not present

## 2018-05-19 MED ORDER — AZITHROMYCIN 250 MG PO TABS: ORAL_TABLET | ORAL | 0 refills | Status: DC

## 2018-05-19 NOTE — Assessment & Plan Note (Signed)
On Humira Due for injection next week Okay to receive if remains afebrile for 24 to 48 hours

## 2018-05-19 NOTE — Assessment & Plan Note (Signed)
Advised debrox x 3 days and schedule ear lavage (patient will contact internist)

## 2018-05-19 NOTE — Assessment & Plan Note (Signed)
Upper respiratory symptoms x 1 week Exam benign and VSS Rx Zpack Recommend Delsym cough syrup BID and continuing nasal spray Return/call if no improvement in 5-7 days or if symptoms worsen

## 2018-05-19 NOTE — Patient Instructions (Addendum)
Delsym cough syrup twice a day for 1 week as needed for cough   Continues nasal spray   Zpack sent to pharmacy   Return or call if not better in 5 days or if symptoms worsen

## 2018-05-19 NOTE — Progress Notes (Signed)
@Patient  ID: , female    DOB: 1951-06-24, 67 y.o.   MRN: [id number]  Chief Complaint  Patient presents with  . Acute Visit    cough with yellow mucus x1 week, head congestion and wheezing     Referring provider: [id number], MD  HPI: 67 year old female, former smoker quit in 1990 (7 pack year hx). PMH ILD, pulmonary fibrosis, RA (on Humira). Patient of Dr. 76, last seen 09/2017.   05/19/2018 Patient presents today for acute visit with complains of head/chest congestion. Cough with yellow mucus, wheezing x 1 week. Associated ear fullness. Wheezing morning and night. Not particularly short of breath. Felt symptoms were originally sinus related. She has not taken anything except for nasal spray. Denies fever.    Allergies  Allergen Reactions  . Morphine Other (See Comments)    Difficulty breathing  . Morphine And Related Shortness Of Breath  . Alphagan [Brimonidine]     Eyelid swelling, scratching Allergic to preservative contained in this med  . Codeine Nausea And Vomiting  . Dorzolamide Hcl-Timolol Mal     Inflammation of the eyelid, scratching Allergic to preservative contained in this med  . Hydrocodone-Acetaminophen Nausea And Vomiting    But tolerates tylenol  . Sulfa Antibiotics Rash    "Large bumps"  . Sulfamethoxazole Rash    Immunization History  Administered Date(s) Administered  . Influenza Split 04/26/2013, 04/08/2017  . Influenza, High Dose Seasonal PF 04/09/2018  . Pneumococcal Conjugate-13 08/27/2013  . Pneumococcal Polysaccharide-23 01/14/2011  . Tdap 11/15/2017    Past Medical History:  Diagnosis Date  . Carotid artery occlusion   . Epilepsy (HCC)   . GERD (gastroesophageal reflux disease)   . Glaucoma   . Hyperlipidemia   . Hypertension   . Keratoconus of both eyes 1981  . Macular degeneration   . Migraines   . Neuromuscular disorder (HCC)    RA  . Peripheral vascular disease (HCC)    carotid occlusion surgery on left  .  Raynaud's disease   . Retinal edema   . Rheumatoid arthritis(714.0)   . Stroke [facility])    two mild strokes presumed from left carotid stenosis    Tobacco History: Social History   Tobacco Use  Smoking Status Former Smoker  . Packs/day: 0.50  . Years: 14.00  . Pack years: 7.00  . Types: Cigarettes  . Last attempt to quit: 07/19/1988  . Years since quitting: 29.8  Smokeless Tobacco Never Used   Counseling given: Not Answered   Outpatient Medications Prior to Visit  Medication Sig Dispense Refill  . Adalimumab 40 MG/0.8ML PNKT Inject 40 mg into the skin every 14 (fourteen) days.     09/16/1988 amLODipine (NORVASC) 5 MG tablet Take 5 mg by mouth daily.      Marland Kitchen aspirin 325 MG tablet Take 325 mg by mouth daily.    Marland Kitchen atorvastatin (LIPITOR) 40 MG tablet Take 40 mg by mouth daily.      . botulinum toxin Type A (BOTOX) 100 units SOLR injection Inject IM into the head and neck muscles every 3 months by provider in office 2 vial 3  . butalbital-acetaminophen-caffeine (FIORICET, ESGIC) 50-325-40 MG tablet Take 1 tablet by mouth every 6 (six) hours as needed for headache. 10 tablet 4  . Calcium Carbonate-Vit D-Min (CALCIUM 1200 PO) Take 2 capsules by mouth daily.      . Cholecalciferol (VITAMIN D PO) Take 1,000 Units by mouth daily.     Marland Kitchen (AIMOVIG)  140 MG/ML SOAJ Inject 140 mg into the skin every 30 (thirty) days. 1 pen 11  . ezetimibe (ZETIA) 10 MG tablet Take 10 mg by mouth daily.      . famotidine (PEPCID) 20 MG tablet TAKE 1 TABLET BY MOUTH AT BEDTIME 30 tablet 2  . famotidine (PEPCID) 20 MG tablet TAKE 1 TABLET BY MOUTH AT BEDTIME 90 tablet 1  . frovatriptan (FROVA) 2.5 MG tablet Take 1 tablet (2.5 mg total) by mouth as needed for migraine. If recurs, may repeat after 2 hours. Max of 2 tabs in 24 hours. 10 tablet 11  . leflunomide (ARAVA) 10 MG tablet Take 20 mg by mouth daily.     Marland Kitchen LORazepam (ATIVAN) 0.5 MG tablet Take 0.5 mg by mouth daily as needed for anxiety or sleep.     .  NONFORMULARY OR COMPOUNDED ITEM Inject 1 application into the skin every 30 (thirty) days. 1 each 11  . pantoprazole (PROTONIX) 40 MG tablet TAKE 1 TABLET(40 MG) BY MOUTH DAILY 30 TO 60 MINUTES BEFORE FIRST MEAL OF THE DAY 90 tablet 1  . phenytoin (DILANTIN) 100 MG ER capsule Take 3 capsules (300 mg total) by mouth daily. 270 capsule 3  . prednisoLONE acetate (PRED FORTE) 1 % ophthalmic suspension Place 1-4 drops into both eyes daily. 4 DROPS INTO R EYE-- ONE DROP INTO L EYE    . promethazine (PHENERGAN) 25 MG tablet Take 1 tablet (25 mg total) by mouth every 6 (six) hours as needed for nausea or vomiting. 30 tablet 11  . SUMAtriptan (IMITREX) 6 MG/0.5ML SOLN injection Inject 0.5 mLs (6 mg total) into the skin as needed for migraine. 8 vial 11  . timolol (TIMOPTIC) 0.5 % ophthalmic solution Place 1 drop into the left eye daily.     . Fremanezumab-vfrm (AJOVY) 225 MG/1.5ML SOSY Inject 225 mg into the skin every 30 (thirty) days. (Patient not taking: Reported on 05/19/2018) 1 Syringe 11  . Lifitegrast (XIIDRA) 5 % SOLN INSTILL 1 DROP INTO LEFT EYE TWICE DAILY    . traMADol (ULTRAM) 50 MG tablet Take 1 tablet (50 mg total) by mouth every 6 (six) hours as needed. (Patient not taking: Reported on 05/19/2018) 20 tablet 0   Facility-Administered Medications Prior to Visit  Medication Dose Route Frequency Provider Last Rate Last Dose  . botulinum toxin Type A (BOTOX) injection 200 Units  200 Units Intramuscular Once Ramond Marrow, DO      . botulinum toxin Type A (BOTOX) injection 200 Units  200 Units Intramuscular Once Ramond Marrow, DO      . botulinum toxin Type A (BOTOX) injection 200 Units  200 Units Intramuscular Once Ramond Marrow, DO      . botulinum toxin Type A (BOTOX) injection 200 Units  200 Units Intramuscular Once Ramond Marrow, DO      . pneumococcal 23 valent vaccine (PNU-IMMUNE) injection 0.5 mL  0.5 mL Intramuscular Once Nyoka Cowden, MD        Review of Systems  Review of  Systems  HENT: Positive for ear pain and sinus pressure.   Respiratory: Positive for cough.     Physical Exam  BP (!) 154/98 (BP Location: Left Arm, Cuff Size: Normal)   Pulse 78   Temp 98.1 F (36.7 C)   Ht 5\' 2"  (1.575 m)   Wt 128 lb 3.2 oz (58.2 kg)   SpO2 95%   BMI 23.45 kg/m  Physical Exam  Constitutional: She is oriented  to person, place, and time. She appears well-developed and well-nourished. No distress.  HENT:  Head: Normocephalic and atraumatic.  Bilateral cerumen impaction   Eyes: Pupils are equal, round, and reactive to light. EOM are normal.  Neck: Normal range of motion. Neck supple.  Cardiovascular: Normal rate and regular rhythm.  Pulmonary/Chest: Effort normal and breath sounds normal. She has no wheezes.  CTA. No wheeze or rhonchi.   Neurological: She is alert and oriented to person, place, and time.  Skin: Skin is warm and dry.  Psychiatric: She has a normal mood and affect. Her behavior is normal. Judgment and thought content normal.     Lab Results:  CBC    Component Value Date/Time   WBC 6.4 09/19/2017 1021   RBC 4.99 09/19/2017 1021   HGB 14.2 09/19/2017 1021   HCT 42.5 09/19/2017 1021   PLT 253 09/19/2017 1021   MCV 85.2 09/19/2017 1021   MCH 28.5 09/19/2017 1021   MCHC 33.4 09/19/2017 1021   RDW 12.8 09/19/2017 1021   LYMPHSABS 1,517 09/19/2017 1021   MONOABS 923 02/10/2017 1003   EOSABS 672 (H) 09/19/2017 1021   BASOSABS 83 09/19/2017 1021    BMET    Component Value Date/Time   NA 142 09/19/2017 1021   K 5.2 09/19/2017 1021   CL 105 09/19/2017 1021   CO2 29 09/19/2017 1021   GLUCOSE 91 09/19/2017 1021   BUN 12 09/19/2017 1021   CREATININE 0.61 09/19/2017 1021   CALCIUM 9.6 09/19/2017 1021   GFRNONAA 96 09/19/2017 1021   GFRAA 111 09/19/2017 1021    BNP No results found for: BNP  ProBNP No results found for: PROBNP  Imaging: No results found.   Assessment & Plan:   URI, acute Upper respiratory symptoms x 1  week Exam benign and VSS Rx Zpack Recommend Delsym cough syrup BID and continuing nasal spray Return/call if no improvement in 5-7 days or if symptoms worsen   Cerumen impaction Advised debrox x 3 days and schedule ear lavage (patient will contact internist)   Rheumatoid arthritis with positive rheumatoid factor (HCC) On Humira Due for injection next week Okay to receive if remains afebrile for 24 to 48 hours   Glenford Bayley, NP 05/19/2018

## 2018-05-22 NOTE — Progress Notes (Signed)
Chart and office note reviewed in detail  > agree with a/p as outlined    

## 2018-05-31 ENCOUNTER — Encounter (INDEPENDENT_AMBULATORY_CARE_PROVIDER_SITE_OTHER): Payer: Self-pay

## 2018-05-31 ENCOUNTER — Encounter: Payer: Self-pay | Admitting: Rheumatology

## 2018-05-31 ENCOUNTER — Ambulatory Visit: Payer: Medicare Other | Admitting: Rheumatology

## 2018-05-31 VITALS — BP 165/87 | HR 75 | Resp 14 | Ht 62.0 in | Wt 127.0 lb

## 2018-05-31 DIAGNOSIS — Z9889 Other specified postprocedural states: Secondary | ICD-10-CM

## 2018-05-31 DIAGNOSIS — M5136 Other intervertebral disc degeneration, lumbar region: Secondary | ICD-10-CM

## 2018-05-31 DIAGNOSIS — J849 Interstitial pulmonary disease, unspecified: Secondary | ICD-10-CM

## 2018-05-31 DIAGNOSIS — H209 Unspecified iridocyclitis: Secondary | ICD-10-CM

## 2018-05-31 DIAGNOSIS — Z8669 Personal history of other diseases of the nervous system and sense organs: Secondary | ICD-10-CM

## 2018-05-31 DIAGNOSIS — M0579 Rheumatoid arthritis with rheumatoid factor of multiple sites without organ or systems involvement: Secondary | ICD-10-CM

## 2018-05-31 DIAGNOSIS — Z79899 Other long term (current) drug therapy: Secondary | ICD-10-CM | POA: Diagnosis not present

## 2018-05-31 DIAGNOSIS — M810 Age-related osteoporosis without current pathological fracture: Secondary | ICD-10-CM

## 2018-05-31 DIAGNOSIS — Z87898 Personal history of other specified conditions: Secondary | ICD-10-CM

## 2018-05-31 NOTE — Patient Instructions (Signed)
Standing Labs We placed an order today for your standing lab work.    Please come back and get your standing labs in February and every 3 months  We have open lab Monday through Friday from 8:30-11:30 AM and 1:30-4:00 PM  at the office of Dr. Madsen Riddle.   You may experience shorter wait times on Monday and Friday afternoons. The office is located at [address], [address], [place], [place] [address] No appointment is necessary.   Labs are drawn by [facility].  You may receive a bill from [facility] for your lab work. If you have any questions regarding directions or hours of operation,  please call [phone].   Just as a reminder please drink plenty of water prior to coming for your lab work. Thanks!  

## 2018-06-02 LAB — CBC WITH DIFFERENTIAL/PLATELET
Basophils Absolute: 70 cells/uL (ref 0–200)
Basophils Relative: 1 %
Eosinophils Absolute: 427 cells/uL (ref 15–500)
Eosinophils Relative: 6.1 %
HCT: 39.7 % (ref 35.0–45.0)
Hemoglobin: 13.4 g/dL (ref 11.7–15.5)
Lymphs Abs: 1589 cells/uL (ref 850–3900)
MCH: 29.9 pg (ref 27.0–33.0)
MCHC: 33.8 g/dL (ref 32.0–36.0)
MCV: 88.6 fL (ref 80.0–100.0)
MPV: 11.3 fL (ref 7.5–12.5)
Monocytes Relative: 11.3 %
Neutro Abs: 4123 cells/uL (ref 1500–7800)
Neutrophils Relative %: 58.9 %
Platelets: 250 10*3/uL (ref 140–400)
RBC: 4.48 10*6/uL (ref 3.80–5.10)
RDW: 12.7 % (ref 11.0–15.0)
Total Lymphocyte: 22.7 %
WBC mixed population: 791 cells/uL (ref 200–950)
WBC: 7 10*3/uL (ref 3.8–10.8)

## 2018-06-02 LAB — COMPLETE METABOLIC PANEL WITH GFR
AG Ratio: 1.4 (calc) (ref 1.0–2.5)
ALT: 22 U/L (ref 6–29)
AST: 31 U/L (ref 10–35)
Albumin: 4.1 g/dL (ref 3.6–5.1)
Alkaline phosphatase (APISO): 83 U/L (ref 33–130)
BUN: 15 mg/dL (ref 7–25)
CO2: 33 mmol/L — ABNORMAL HIGH (ref 20–32)
Calcium: 9.3 mg/dL (ref 8.6–10.4)
Chloride: 104 mmol/L (ref 98–110)
Creat: 0.54 mg/dL (ref 0.50–0.99)
GFR, Est African American: 115 mL/min/{1.73_m2} (ref >=60)
GFR, Est Non African American: 99 mL/min/{1.73_m2} (ref >=60)
Globulin: 2.9 g/dL (calc) (ref 1.9–3.7)
Glucose, Bld: 112 mg/dL — ABNORMAL HIGH (ref 65–99)
Potassium: 4.7 mmol/L (ref 3.5–5.3)
Sodium: 142 mmol/L (ref 135–146)
Total Bilirubin: 0.4 mg/dL (ref 0.2–1.2)
Total Protein: 7 g/dL (ref 6.1–8.1)

## 2018-06-02 LAB — QUANTIFERON-TB GOLD PLUS
Mitogen-NIL: >10 IU/mL
NIL: 0.03 IU/mL
QuantiFERON-TB Gold Plus: NEGATIVE
TB1-NIL: <0 IU/mL
TB2-NIL: 0 IU/mL

## 2018-06-13 ENCOUNTER — Other Ambulatory Visit: Payer: Self-pay

## 2018-06-13 ENCOUNTER — Ambulatory Visit: Payer: BC Managed Care – PPO | Admitting: Internal Medicine

## 2018-06-13 DIAGNOSIS — R0989 Other specified symptoms and signs involving the circulatory and respiratory systems: Secondary | ICD-10-CM

## 2018-06-13 DIAGNOSIS — Z8249 Family history of ischemic heart disease and other diseases of the circulatory system: Secondary | ICD-10-CM

## 2018-06-13 DIAGNOSIS — I7300 Raynaud's syndrome without gangrene: Secondary | ICD-10-CM

## 2018-06-13 DIAGNOSIS — I6522 Occlusion and stenosis of left carotid artery: Secondary | ICD-10-CM

## 2018-06-21 ENCOUNTER — Encounter: Payer: Self-pay | Admitting: Internal Medicine

## 2018-06-21 ENCOUNTER — Ambulatory Visit (INDEPENDENT_AMBULATORY_CARE_PROVIDER_SITE_OTHER): Payer: Medicare Other | Admitting: Internal Medicine

## 2018-06-21 VITALS — BP 136/84 | HR 75 | Ht 61.0 in | Wt 126.0 lb

## 2018-06-21 DIAGNOSIS — J8410 Pulmonary fibrosis, unspecified: Secondary | ICD-10-CM | POA: Diagnosis not present

## 2018-06-21 DIAGNOSIS — R059 Cough, unspecified: Secondary | ICD-10-CM

## 2018-06-21 DIAGNOSIS — R05 Cough: Secondary | ICD-10-CM

## 2018-06-21 LAB — PULMONARY FUNCTION TEST
DL/VA % pred: 106 %
DL/VA: 4.66 ml/min/mmHg/L
DLCO cor % pred: 65 %
DLCO cor: 13.18 ml/min/mmHg
DLCO unc % pred: 65 %
DLCO unc: 13.18 ml/min/mmHg
FEF 25-75 Post: 1.48 L/sec
FEF 25-75 Pre: 1.4 L/sec
FEF2575-%Change-Post: 5 %
FEF2575-%Pred-Post: 75 %
FEF2575-%Pred-Pre: 71 %
FEV1-%Change-Post: 2 %
FEV1-%Pred-Post: 70 %
FEV1-%Pred-Pre: 69 %
FEV1-Post: 1.51 L
FEV1-Pre: 1.48 L
FEV1FVC-%Change-Post: 2 %
FEV1FVC-%Pred-Pre: 103 %
FEV6-%Change-Post: 0 %
FEV6-%Pred-Post: 68 %
FEV6-%Pred-Pre: 68 %
FEV6-Post: 1.85 L
FEV6-Pre: 1.84 L
FEV6FVC-%Change-Post: 0 %
FEV6FVC-%Pred-Post: 104 %
FEV6FVC-%Pred-Pre: 103 %
FVC-%Change-Post: 0 %
FVC-%Pred-Post: 65 %
FVC-%Pred-Pre: 65 %
FVC-Post: 1.85 L
FVC-Pre: 1.85 L
Post FEV1/FVC ratio: 82 %
Post FEV6/FVC ratio: 100 %
Pre FEV1/FVC ratio: 80 %
Pre FEV6/FVC Ratio: 99 %
RV % pred: 72 %
RV: 1.41 L
TLC % pred: 70 %
TLC: 3.25 L

## 2018-06-21 NOTE — Patient Instructions (Signed)
Regular physical activity with monitoring of 02 saturations toward the end of exercise (nl values lower 90's) and call if trending down   Please schedule a follow up visit in 12 months but call sooner if needed with pfts

## 2018-06-21 NOTE — Progress Notes (Signed)
PFT done today. 

## 2018-06-21 NOTE — Progress Notes (Signed)
Subjective:     Patient ID: Brianna [name], female   DOB: 06-16-1951,     MRN: [id number]       Brief patient profile:  67 yowf   quit smoking age 67 with no resp problems then @  36 bad pneumonia.  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   No sign doe but not that active.   Sleeping ok without nocturnal  or early am exac of resp c/o's or need for noct saba.  rec Ok to restart boniva the first of September and every month but if your respiratory or reflux symptoms worsen it's first medication I would stop and take in it's place Reclast IV yearly.    11/13/2013 f/u ov/Brianna [name] re:  PF assoc with RA Chief Complaint  Patient presents with  . Followup with PFT    Pt states that her breathing is unchanged since her last visit. No new co's today.    can now do 1st to 2nd floor s sob and working out regularly s limits due to sob rec  Return in one year for pfts and cxr and call sooner if not decline in tolerance > did not return       04/22/2017  [facility] office visit/ Brianna [name]  / re-establish re RA assoc ILD  Chief Complaint  Patient presents with  . Pulmonary Consult    Self referral for cough x 3-4 months. She is coughing up white to yellow sputum.    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  rec Augmentin 875 mg take one pill twice daily  X 10 days - take at breakfast and supper with large glass of water.  It would help reduce the usual side effects (diarrhea and yeast infections) if you ate cultured yogurt at lunch.  Prednisone 10 mg take  4 each am x 2 days,   2 each am x 2 days,  1 each am x 2 days and stop  Stop fosfamax for now  Pantoprazole (protonix) 40 mg   Take  30-60 min before first meal of the day and Pepcid (famotidine)  20 mg one @  bedtime until return to office - this is the best way to tell whether stomach acid is contributing to your problem.   GERD diet     06/14/2017  f/u ov/Brianna [name] re:  uacs ? Aggravated by fosfamax  Chief Complaint  Patient presents with  . Follow-up    PFT done today. Her cough is much improved. No new co's. She wants to discuss alternative med for bones since we stopped the fosamax.  cough still present somedays  p stirring in am / no pm cough at all which is quite an improvement s need for cough suppression Rates herself at 90% better  rec Restart fosmax 2 weeks before visit (= 2 doses ) prior to your visit with your rheumatologist Risk of reflux goes up on fosfamax so ideally I would recommend a substitute either Reclast or Prolia if feasible but defer the final call on this issue to your rheumatologist      10/11/2017  f/u ov/Brianna [name] re:  uacs  In pt with RA better off fosfamax / still on gerd rx  Chief Complaint  Patient presents with  . Follow-up    wheezing when laying down at night, but cough is better   Dyspnea: Not limited by breathing from desired activities   Cough: minimal  Sleep: ok  SABA use : none  rec No change rx    06/21/2018  f/u ov/Brianna [name] re:  ? RA lung dz/  Chief Complaint  Patient presents with  . Follow-up    PFT's done. Cough has resolved. No new co's.   Dyspnea:  Not limited by breathing from desired activities   Cough:  none Sleeping: 20 degrees helps with nasal congestion SABA use: none 02: none  Systemic RA control good x has retinal involvement > f/u [facility]    No obvious day to day or daytime variability or assoc excess/ purulent sputum or mucus plugs or hemoptysis or cp or chest tightness, subjective wheeze or overt   hb symptoms.   Sleeping as above  without nocturnal  or early am exacerbation  of respiratory  c/o's or need for noct saba. Also denies any obvious fluctuation of symptoms with weather or environmental changes or other aggravating or alleviating factors except as outlined above   No unusual exposure hx or h/o childhood pna/ asthma or knowledge of premature birth.  Current Allergies, Complete Past Medical History, Past Surgical History, Family History, and Social History were reviewed in [employer] record.  ROS  The following are not active complaints unless bolded Hoarseness, sore throat, dysphagia, dental problems, itching, sneezing,  nasal congestion or discharge of excess mucus or purulent secretions, ear ache,   fever, chills, sweats, unintended wt loss or wt gain, classically pleuritic or exertional cp,  orthopnea pnd or arm/hand swelling  or leg swelling, presyncope, palpitations, abdominal pain, anorexia, nausea, vomiting, diarrhea  or change in bowel habits or change in bladder habits, change in stools or change in urine, dysuria, hematuria,  rash, arthralgias, visual complaints, headache, numbness, weakness or ataxia or problems with walking or coordination,  change in mood/anxiety/depression or  memory.        Current Meds  Medication Sig  . Adalimumab 40 MG/0.8ML PNKT Inject 40 mg into the skin every 14 (fourteen) days.   Marland Kitchen amLODipine (NORVASC) 5 MG tablet Take 5 mg by mouth daily.    Marland Kitchen aspirin 325 MG tablet Take 325 mg by mouth daily.  Marland Kitchen atorvastatin (LIPITOR) 40 MG tablet Take 40 mg by mouth daily.    . botulinum toxin Type A (BOTOX) 100 units SOLR  injection Inject IM into the head and neck muscles every 3 months by provider in office  . butalbital-acetaminophen-caffeine (FIORICET, ESGIC) 50-325-40 MG tablet Take 1 tablet by mouth every 6 (six) hours as needed for headache.  . Calcium Carbonate-Vit D-Min (CALCIUM 1200 PO) Take 2 capsules by mouth daily.    . Cholecalciferol (VITAMIN D PO) Take 1,000 Units by mouth daily.   Marland Kitchen  cycloSPORINE modified (NEORAL) 50 MG capsule Take by mouth.  Dorise Hiss (AIMOVIG) 140 MG/ML SOAJ Inject 140 mg into the skin every 30 (thirty) days.  Marland Kitchen ezetimibe (ZETIA) 10 MG tablet Take 10 mg by mouth daily.    . famotidine (PEPCID) 20 MG tablet TAKE 1 TABLET BY MOUTH AT BEDTIME  . famotidine (PEPCID) 20 MG tablet TAKE 1 TABLET BY MOUTH AT BEDTIME  . Fremanezumab-vfrm (AJOVY) 225 MG/1.5ML SOSY Inject 225 mg into the skin every 30 (thirty) days.  . frovatriptan (FROVA) 2.5 MG tablet Take 1 tablet (2.5 mg total) by mouth as needed for migraine. If recurs, may repeat after 2 hours. Max of 2 tabs in 24 hours.  Marland Kitchen leflunomide (ARAVA) 10 MG tablet Take 20 mg by mouth daily.   . Lifitegrast (XIIDRA) 5 % SOLN INSTILL 1 DROP INTO LEFT EYE TWICE DAILY  . LORazepam (ATIVAN) 0.5 MG tablet Take 0.5 mg by mouth daily as needed for anxiety or sleep.   . NONFORMULARY OR COMPOUNDED ITEM Inject 1 application into the skin every 30 (thirty) days.  . pantoprazole (PROTONIX) 40 MG tablet TAKE 1 TABLET(40 MG) BY MOUTH DAILY 30 TO 60 MINUTES BEFORE FIRST MEAL OF THE DAY  . phenytoin (DILANTIN) 100 MG ER capsule Take 3 capsules (300 mg total) by mouth daily.  . prednisoLONE acetate (PRED FORTE) 1 % ophthalmic suspension Place 1-4 drops into both eyes daily. 4 DROPS INTO R EYE-- ONE DROP INTO L EYE  . promethazine (PHENERGAN) 25 MG tablet Take 1 tablet (25 mg total) by mouth every 6 (six) hours as needed for nausea or vomiting.  . SUMAtriptan (IMITREX) 6 MG/0.5ML SOLN injection Inject 0.5 mLs (6 mg total) into the skin as needed for  migraine.  . timolol (TIMOPTIC) 0.5 % ophthalmic solution Place 1 drop into the left eye daily.    Current Facility-Administered Medications for the 06/21/18 encounter (Office Visit) with Nyoka Cowden, MD  Medication  . botulinum toxin Type A (BOTOX) injection 200 Units  . botulinum toxin Type A (BOTOX) injection 200 Units  . botulinum toxin Type A (BOTOX) injection 200 Units  . botulinum toxin Type A (BOTOX) injection 200 Units  . pneumococcal 23 valent vaccine (PNU-IMMUNE) injection 0.5 mL                      Objective:   Physical Exam   amb wf nad   Wt 130 01/14/2011 > 08/27/2013  126 >  11/13/2013 125 > 04/22/2017  125 > 06/14/2017   127 > 10/11/2017  124 > 06/21/2018  126       Vital signs reviewed - Note on arrival 02 sats  95% on RA        HEENT: nl dentition, turbinates bilaterally, and oropharynx. Nl external ear canals without cough reflex   NECK :  without JVD/Nodes/TM/ nl carotid upstrokes bilaterally   LUNGS: no acc muscle use,  Nl contour chest with very min insp crackles  bilaterally without cough on insp or exp maneuvers   CV:  RRR  no s3 or murmur or increase in P2, and no edema   ABD:  soft and nontender with nl inspiratory excursion in the supine position. No bruits or organomegaly appreciated, bowel sounds nl  MS:  Nl gait/ ext warm with mild RA changes both hands, calf tenderness, cyanosis or clubbing No obvious joint restrictions   SKIN: warm and dry without lesions    NEURO:  alert, approp, nl sensorium  with  no motor or cerebellar deficits apparent.            Assessment:

## 2018-06-22 ENCOUNTER — Encounter: Payer: Self-pay | Admitting: Internal Medicine

## 2018-06-22 NOTE — Assessment & Plan Note (Signed)
-   Sinus CT  01/14/2011 > neg - Trial off fosfamax and on GERD rx 04/22/17 > improved 06/14/2017    Cough has resolved on gerd rx and on IV reclast yearly, no changes needed     I had an extended discussion with the patient and husband  reviewing all relevant studies completed to date and  lasting 15 to 20 minutes of a 25 minute visit    Each maintenance medication was reviewed in detail including most importantly the difference between maintenance and prns and under what circumstances the prns are to be triggered using an action plan format that is not reflected in the computer generated alphabetically organized AVS.     Please see AVS for specific instructions unique to this visit that I personally wrote and verbalized to the the pt in detail and then reviewed with pt  by my nurse highlighting any  changes in therapy recommended at today's visit to their plan of care.

## 2018-06-22 NOTE — Assessment & Plan Note (Signed)
-   Assoc with RA      - PFT's 06/15/2006 FEV1 (1.53)  Ratio 84 and DLC0 46 corrects to 90%     - PFT's  03/04/2011  FEV1 (1.73)  Ratio 83 and VC 2.11 and DLC0 63% corrects  94%     - PFT's 11/13/2013    VC 1.94 with DLCO 65 corrects to 102%     - 08/27/2013  Walked RA x 2 laps @ 185 ft each stopped due to  Oximeter stopped recording accurately but sats still 96% and no sob - PFT's  06/14/2017  FVC 1.71  (60 % ) ratio 89  p 5 % improvement from saba p nothing prior to study with DLCO  59 % corrects to 97 % for alv volume   PFT's  06/21/2018  FVC  1.85 (65%)   p no % improvement from saba p nothing prior to study with DLCO  65 % corrects to 106  % for alv volume   As expected, overall lung function has stabilized with improvement in systemic control of the dz =  there is no active airway issue with no change in restrictive pattern or loss of dlco   Key is to maintain that degree of systemic control and avoid meds with potential for pulmonary toxicity eg mtx with regular monitoring of 02 sats with ex and early pulmonary f/u if that happens  -  Otherwise can see yearly

## 2018-06-28 ENCOUNTER — Encounter (HOSPITAL_COMMUNITY): Payer: Medicare Other

## 2018-06-28 ENCOUNTER — Other Ambulatory Visit (HOSPITAL_COMMUNITY): Payer: Medicare Other

## 2018-06-30 ENCOUNTER — Ambulatory Visit: Payer: Medicare Other | Admitting: Family

## 2018-07-03 ENCOUNTER — Other Ambulatory Visit: Payer: Self-pay | Admitting: Internal Medicine

## 2018-07-03 DIAGNOSIS — R059 Cough, unspecified: Secondary | ICD-10-CM

## 2018-07-03 DIAGNOSIS — R05 Cough: Secondary | ICD-10-CM

## 2018-07-19 ENCOUNTER — Other Ambulatory Visit: Payer: Self-pay | Admitting: Internal Medicine

## 2018-07-19 DIAGNOSIS — R05 Cough: Secondary | ICD-10-CM

## 2018-07-19 DIAGNOSIS — R059 Cough, unspecified: Secondary | ICD-10-CM

## 2018-08-08 ENCOUNTER — Telehealth: Payer: Self-pay | Admitting: Neurology

## 2018-08-08 NOTE — Telephone Encounter (Signed)
Pt is needing a botox appt.

## 2018-08-08 NOTE — Telephone Encounter (Signed)
Pt states she needs her migraine medication resubmitted to her insurance. Please advise.

## 2018-08-08 NOTE — Telephone Encounter (Signed)
I spoke with the patient. She is referring to Botox authorization. She also said that she is out of the Aimovig now and her next dose is due 08/18/2018. She would like an authorization done for Aimovig too because she does best with both but is aware that insurance may not cover both. Her primary choice for migraine prevention would be Botox if she has to choose.

## 2018-08-09 NOTE — Telephone Encounter (Signed)
I called and scheduled the patients botox injection with her. DW

## 2018-08-09 NOTE — Telephone Encounter (Signed)
Noted, I called the patient and scheduled botox injection. I will call the pharmacy and complete authorization request.

## 2018-08-09 NOTE — Telephone Encounter (Signed)
Pt has nurse visit scheduled 1/23 @ 08:30 for samples.

## 2018-08-09 NOTE — Telephone Encounter (Signed)
We can provide a few Aimovig samples for the mean time thanks

## 2018-08-09 NOTE — Telephone Encounter (Signed)
I called the patient and LVM asking for call back. Left office number in message. When pt calls back, please let her know that we can provide her with 3 Aimovig samples. Please put her on the nurse schedule for sample pickup by Friday.

## 2018-08-10 ENCOUNTER — Ambulatory Visit: Payer: Self-pay | Admitting: *Deleted

## 2018-08-10 DIAGNOSIS — Z760 Encounter for issue of repeat prescription: Secondary | ICD-10-CM

## 2018-08-10 DIAGNOSIS — Z0289 Encounter for other administrative examinations: Secondary | ICD-10-CM

## 2018-08-10 MED ORDER — ERENUMAB-AOOE 140 MG/ML ~~LOC~~ SOAJ: 140.0000 mg | SUBCUTANEOUS | 0 refills | Status: DC

## 2018-08-10 NOTE — Progress Notes (Signed)
Patient arrived and was given Aimovig 140 mg samples x 3 per v.o. Dr. Lucia Gaskins. Pt also given copy of medication list including Aimovig. Pt aware to refrigerate these. Her questions were answered and she verbalized appreciation.

## 2018-08-11 NOTE — Telephone Encounter (Signed)
[id number] (11/10/18)

## 2018-08-14 ENCOUNTER — Telehealth: Payer: Self-pay | Admitting: Neurology

## 2018-08-14 NOTE — Telephone Encounter (Signed)
Pt states she is getting calls requesting a payment for her BOTOX but when she calls [employer] they state they did not call her. Pt is wanting to know where the BOTOX is being ordered. She says she has no problem paying here at the office. Please advise.

## 2018-08-15 NOTE — Telephone Encounter (Signed)
I called the patient back but she did not answer so I left a VM asking her to return my call. It is the SP for her medication calling, [facility]. Their number is [phone]. She needs to give them consent to deliver the medication to us.

## 2018-08-17 ENCOUNTER — Ambulatory Visit (HOSPITAL_COMMUNITY)
Admission: RE | Admit: 2018-08-17 | Discharge: 2018-08-17 | Disposition: A | Discharge status: Home / Self Care | Payer: Medicare Other | Source: Ambulatory Visit | Attending: Family | Admitting: Family

## 2018-08-17 ENCOUNTER — Ambulatory Visit: Payer: Medicare Other | Admitting: Physician Assistant

## 2018-08-17 ENCOUNTER — Ambulatory Visit (INDEPENDENT_AMBULATORY_CARE_PROVIDER_SITE_OTHER)
Admission: RE | Admit: 2018-08-17 | Discharge: 2018-08-17 | Disposition: A | Discharge status: Home / Self Care | Payer: Medicare Other | Source: Ambulatory Visit | Attending: Family | Admitting: Family

## 2018-08-17 ENCOUNTER — Other Ambulatory Visit: Payer: Self-pay

## 2018-08-17 ENCOUNTER — Encounter: Payer: Self-pay | Admitting: Family

## 2018-08-17 VITALS — BP 150/96 | HR 51 | Temp 97.0°F | Resp 16 | Ht 62.0 in | Wt 124.0 lb

## 2018-08-17 DIAGNOSIS — Z8249 Family history of ischemic heart disease and other diseases of the circulatory system: Secondary | ICD-10-CM | POA: Diagnosis present

## 2018-08-17 DIAGNOSIS — I7300 Raynaud's syndrome without gangrene: Secondary | ICD-10-CM | POA: Diagnosis not present

## 2018-08-17 DIAGNOSIS — R0989 Other specified symptoms and signs involving the circulatory and respiratory systems: Secondary | ICD-10-CM | POA: Insufficient documentation

## 2018-08-17 DIAGNOSIS — I6522 Occlusion and stenosis of left carotid artery: Secondary | ICD-10-CM

## 2018-08-17 NOTE — Progress Notes (Signed)
History of Present Illness:  Patient is a 68 y.o. year old female who presents for evaluation of carotid stenosis.  She is s/p left carotid endarterectomy on 11/15/2007 by Dr. Darrick Penna.  On her last visit 07/07/2017 her abdominal aortic pulse is mildly palpable on expiration.  Her mother had an abdominal aortic aneurysm, pt has no symptoms referable to an AAA.     She reports 2 TIA's; the second was about 2006 as manifested by expressive aphasia, right hemiparesis, severe headache, and transient left monocular loss of vision.  She is currently back to baseline and asymptomatic.   Past medical ZR:AQTMAU surgery x 2 on her right eye, needs a third surgery as her right cornea , migraines, HTN, hyperlipidemia and hx of tobacco abuse quit at age 55.        Past Medical History:  Diagnosis Date  . Carotid artery occlusion   . Epilepsy (HCC)   . GERD (gastroesophageal reflux disease)   . Glaucoma   . Hyperlipidemia   . Hypertension   . Keratoconus of both eyes 1981  . Macular degeneration   . Migraines   . Neuromuscular disorder (HCC)    RA  . Peripheral vascular disease (HCC)    carotid occlusion surgery on left  . Raynaud's disease   . Retinal edema   . Rheumatoid arthritis(714.0)   . Stroke [facility])    two mild strokes presumed from left carotid stenosis    Past Surgical History:  Procedure Laterality Date  . CAROTID ARTERY ANGIOPLASTY  2008   Dr Madilyn Fireman  . CAROTID ENDARTERECTOMY Left 11-15-2007  . cataract extraction Right 04-2014   [facility]  . CHOLECYSTECTOMY  1995  . CORNEAL TRANSPLANT     x 5 ; steroid inj. retnal information  . CORNEAL TRANSPLANT Right 01-22-2014   [facility]  . EYE SURGERY Right 12/2016   cornea repair   . EYE SURGERY Right 09/20/2017   cornea transplant   . GLAUCOMA SURGERY Right 2018  . TRIGGER FINGER RELEASE Right 05/16/2018   Procedure: RELEASE TRIGGER FINGER/A-1 PULLEY RIGHT THUMB;  Surgeon: Cindee Salt, MD;  Location: [facility];  Service: Orthopedics;  Laterality: Right;     Social History Social History   Tobacco Use  . Smoking status: Former Smoker    Packs/day: 0.50    Years: 14.00    Pack years: 7.00    Types: Cigarettes    Last attempt to quit: 07/19/1988    Years since quitting: 30.0  . Smokeless tobacco: Never Used  Substance Use Topics  . Alcohol use: No  . Drug use: No    Family History Family History  Problem Relation Age of Onset  . Heart disease Mother   . Lung disease Mother        ? disease process  . Uterine cancer Mother   . Heart disease Father   . Clotting disorder Father   . Collagen disease Father   . Cervical cancer Maternal Aunt   . Prostate cancer Maternal Grandfather   . Rheum arthritis Sister   . High Cholesterol Sister   . Epilepsy Sister   . Rheum arthritis Maternal Grandmother   . High Cholesterol Brother   . High blood pressure Brother   . High blood pressure Brother   . High Cholesterol Brother   . High blood pressure Brother   . High Cholesterol Brother     Allergies  Allergies  Allergen Reactions  . Morphine  Other (See Comments)    Difficulty breathing  . Morphine And Related Shortness Of Breath  . Alphagan [Brimonidine]     Eyelid swelling, scratching Allergic to preservative contained in this med  . Codeine Nausea And Vomiting  . Dorzolamide Hcl-Timolol Mal     Inflammation of the eyelid, scratching Allergic to preservative contained in this med  . Hydrocodone-Acetaminophen Nausea And Vomiting    But tolerates tylenol  . Sulfa Antibiotics Rash    "Large bumps"  . Sulfamethoxazole Rash     Current Outpatient Medications  Medication Sig Dispense Refill  . Adalimumab 40 MG/0.8ML PNKT Inject 40 mg into the skin every 14 (fourteen) days.     Marland Kitchen amLODipine (NORVASC) 5 MG tablet Take 5 mg by mouth daily.      Marland Kitchen aspirin 325 MG tablet Take 325 mg by mouth daily.    Marland Kitchen atorvastatin (LIPITOR) 40 MG tablet Take 40 mg by mouth daily.      .  botulinum toxin Type A (BOTOX) 100 units SOLR injection Inject IM into the head and neck muscles every 3 months by provider in office 2 vial 3  . butalbital-acetaminophen-caffeine (FIORICET, ESGIC) 50-325-40 MG tablet Take 1 tablet by mouth every 6 (six) hours as needed for headache. 10 tablet 4  . Calcium Carbonate-Vit D-Min (CALCIUM 1200 PO) Take 2 capsules by mouth daily.      . Cholecalciferol (VITAMIN D PO) Take 1,000 Units by mouth daily.     . cycloSPORINE modified (NEORAL) 50 MG capsule Take by mouth.    Dorise Hiss (AIMOVIG) 140 MG/ML SOAJ Inject 140 mg into the skin every 30 (thirty) days. 1 pen 11  . Erenumab-aooe (AIMOVIG) 140 MG/ML SOAJ Inject 140 mg into the skin every 30 (thirty) days. 3 pen 0  . ezetimibe (ZETIA) 10 MG tablet Take 10 mg by mouth daily.      . famotidine (PEPCID) 20 MG tablet TAKE 1 TABLET BY MOUTH AT BEDTIME 30 tablet 2  . famotidine (PEPCID) 20 MG tablet TAKE 1 TABLET BY MOUTH AT BEDTIME 90 tablet 1  . frovatriptan (FROVA) 2.5 MG tablet Take 1 tablet (2.5 mg total) by mouth as needed for migraine. If recurs, may repeat after 2 hours. Max of 2 tabs in 24 hours. 10 tablet 11  . leflunomide (ARAVA) 10 MG tablet Take 20 mg by mouth daily.     . Lifitegrast (XIIDRA) 5 % SOLN INSTILL 1 DROP INTO LEFT EYE TWICE DAILY    . LORazepam (ATIVAN) 0.5 MG tablet Take 0.5 mg by mouth daily as needed for anxiety or sleep.     . NONFORMULARY OR COMPOUNDED ITEM Inject 1 application into the skin every 30 (thirty) days. 1 each 11  . pantoprazole (PROTONIX) 40 MG tablet TAKE 1 TABLET(40 MG) BY MOUTH DAILY 30 TO 60 MINUTES BEFORE FIRST MEAL OF THE DAY 90 tablet 1  . phenytoin (DILANTIN) 100 MG ER capsule Take 3 capsules (300 mg total) by mouth daily. 270 capsule 3  . prednisoLONE acetate (PRED FORTE) 1 % ophthalmic suspension Place 1-4 drops into both eyes daily. 4 DROPS INTO R EYE-- ONE DROP INTO L EYE    . promethazine (PHENERGAN) 25 MG tablet Take 1 tablet (25 mg total) by mouth  every 6 (six) hours as needed for nausea or vomiting. 30 tablet 11  . SUMAtriptan (IMITREX) 6 MG/0.5ML SOLN injection Inject 0.5 mLs (6 mg total) into the skin as needed for migraine. 8 vial 11  . timolol (TIMOPTIC)  0.5 % ophthalmic solution Place 1 drop into the left eye daily.      Current Facility-Administered Medications  Medication Dose Route Frequency Provider Last Rate Last Dose  . botulinum toxin Type A (BOTOX) injection 200 Units  200 Units Intramuscular Once Ramond Marrow, DO      . botulinum toxin Type A (BOTOX) injection 200 Units  200 Units Intramuscular Once Ramond Marrow, DO      . botulinum toxin Type A (BOTOX) injection 200 Units  200 Units Intramuscular Once Ramond Marrow, DO      . botulinum toxin Type A (BOTOX) injection 200 Units  200 Units Intramuscular Once Ramond Marrow, DO      . pneumococcal 23 valent vaccine (PNU-IMMUNE) injection 0.5 mL  0.5 mL Intramuscular Once Nyoka Cowden, MD        ROS:   General:  No weight loss, Fever, chills  HEENT: No recent headaches, no nasal bleeding, no visual changes, no sore throat Visual defects, glaucoma, Migraines   Neurologic: No dizziness, blackouts, seizures. No recent symptoms of stroke or mini- stroke. No recent episodes of slurred speech, or temporary blindness.  Cardiac: No recent episodes of chest pain/pressure, no shortness of breath at rest.  No shortness of breath with exertion.  Denies history of atrial fibrillation or irregular heartbeat  Vascular: No history of rest pain in feet.  No history of claudication.  No history of non-healing ulcer, No history of DVT   Pulmonary: No home oxygen, no productive cough, no hemoptysis,  No asthma or wheezing  Musculoskeletal:   Arthritis,  Low back pain,   Joint pain  Hematologic:No history of hypercoagulable state.  No history of easy bleeding.  No history of anemia  Gastrointestinal: No hematochezia or melena,  No gastroesophageal reflux, no trouble  swallowing  Urinary:  chronic Kidney disease,  on HD -  MWF or  TTHS,  Burning with urination,  Frequent urination,  Difficulty urinating;   Skin: No rashes  Psychological: No history of anxiety,  No history of depression   Physical Examination  Vitals:   08/17/18 1023  BP: (!) 150/96  Pulse: (!) 51  Resp: 16  Temp: (!) 97 F (36.1 C)  TempSrc: Oral  SpO2: 98%  Weight: 124 lb (56.2 kg)  Height:  (1.575 m)    Body mass index is 22.68 kg/m.  General:  Alert and oriented, no acute distress HEENT: Normal, normocephalic Neck: No bruit or JVD Pulmonary: Clear to auscultation bilaterally Cardiac: Regular Rate and Rhythm without murmur Gastrointestinal: Soft, non-tender, non-distended, no mass, no scars Skin: No rash Extremity Pulses:  2+ radial, brachial, femoral, dorsalis pedis, posterior tibial pulses bilaterally Musculoskeletal: No deformity or edema  Neurologic: Upper and lower extremity motor 5/5 and symmetric  DATA:    Abdominal Aorta Findings: +-----------+-------+----------+----------+--------+--------+--------+ Location   AP (cm)Trans (cm)PSV (cm/s)WaveformThrombusComments +-----------+-------+----------+----------+--------+--------+--------+ Proximal   2.70   2.62      57                                 +-----------+-------+----------+----------+--------+--------+--------+ Mid        1.55   1.74      59                                 +-----------+-------+----------+----------+--------+--------+--------+  Distal     1.55   1.37      69                                 +-----------+-------+----------+----------+--------+--------+--------+ RT CIA Prox1.0    1.1       77                                 +-----------+-------+----------+----------+--------+--------+--------+ LT CIA Prox1.0    1.0       89                                  +-----------+-------+----------+----------+--------+--------+--------+  Incidental finding of >60% right renal artery stenosis with a peak systolic velocity of 425 cm/sec at the origin.    Summary: Abdominal Aorta: No evidence of an abdominal aortic aneurysm was visualized. The largest aortic measurement is 2.7 cm. Incidental finding of >60% right renal artery stenosis.      Right Duplex Findings: +----------+--------+-----+--------+--------+--------+           PSV cm/sRatioStenosisWaveformComments +----------+--------+-----+--------+--------+--------+ CFA Distal67                   biphasic         +----------+--------+-----+--------+--------+--------+ POP Prox  55                   biphasic         +----------+--------+-----+--------+--------+--------+    Right Diameter(s): +---------+-----------------------+--------------+          Anterior/Posterial (cm)Transverse(cm) +---------+-----------------------+--------------+ CFA      0.82                   0.88           +---------+-----------------------+--------------+ Popliteal0.78                   0.71           +---------+-----------------------+--------------+  +---------------+-------+-----------+--------+--------+-----+--------+ Right PoplitealAP (cm)Transv (cm)WaveformStenosisShapeComments +---------------+-------+-----------+--------+--------+-----+--------+ Proximal       0.78   0.71       biphasic                      +---------------+-------+-----------+--------+--------+-----+--------+  +--------------+-------+-----------+--------+--------+-----+--------+ Left PoplitealAP (cm)Transv (cm)WaveformStenosisShapeComments +--------------+-------+-----------+--------+--------+-----+--------+ Proximal      0.65   0.71       biphasic                      +--------------+-------+-----------+--------+--------+-----+--------+     Left Duplex  Findings: +----------+--------+-----+--------+--------+--------+           PSV cm/sRatioStenosisWaveformComments +----------+--------+-----+--------+--------+--------+ CFA Distal75                   biphasic         +----------+--------+-----+--------+--------+--------+ POP Prox  63                   biphasic         +----------+--------+-----+--------+--------+--------+    Left Diameter(s): +---------+-----------------------+---------------+          Anterior/Posterior (cm)Transverse (cm) +---------+-----------------------+---------------+ CFA      0.97                   1.11            +---------+-----------------------+---------------+ Popliteal0.65  0.71            +---------+-----------------------+---------------+      Summary: Right: Near normal examination. No evidence of significant arterial dilatation. Diffuse, calcific plaque seen throughout the arteries.  Left: Near normal examination. No evidence of significant arterial dilatation. Diffuse, calcific plaque seen throughout the arteries.    ABI Findings: +---------+------------------+-----+----------+--------+ Right    Rt Pressure (mmHg)IndexWaveform  Comment  +---------+------------------+-----+----------+--------+ Brachial 204                                       +---------+------------------+-----+----------+--------+ ATA      183               0.90 monophasic         +---------+------------------+-----+----------+--------+ PTA      209               1.02 triphasic          +---------+------------------+-----+----------+--------+ Great Toe107               0.52                    +---------+------------------+-----+----------+--------+  +---------+------------------+-----+---------+-------+ Left     Lt Pressure (mmHg)IndexWaveform Comment +---------+------------------+-----+---------+-------+ Brachial 197                                      +---------+------------------+-----+---------+-------+ ATA      211               1.03 triphasic        +---------+------------------+-----+---------+-------+ PTA      210               1.03 triphasic        +---------+------------------+-----+---------+-------+ Great Toe136               0.67                  +---------+------------------+-----+---------+-------+  +-------+-----------+-----------+------------+------------+ ABI/TBIToday's ABIToday's TBIPrevious ABIPrevious TBI +-------+-----------+-----------+------------+------------+ Right  1.02       0.52                                +-------+-----------+-----------+------------+------------+ Left   1.03       0.67                                +-------+-----------+-----------+------------+------------+      Summary: Right: Resting right ankle-brachial index is within normal range. No evidence of significant right lower extremity arterial disease. The right toe-brachial index is abnormal.  Left: Resting left ankle-brachial index is within normal range. No evidence of significant left lower extremity arterial disease. The left toe-brachial index is abnormal.   ASSESSMENT:  Carotid stenosis symptomatic with history of TIA x 2 s/p left CEA by Dr. Darrick PennaFields in 2009. Family history of AAA with rupture PAD  PLAN: She has no symptoms of AAA to include sever lumbar or abdominal pain.  PO intake without N/V.  I was unable to palpate her aortic pulse on examination today.  She is active without symptoms of claudication.  Normal aortic size without evidence of aneurysm, normal ABI's and arterial duplex of B LE exception of the right AT  monophasic flow with palpable pulses.    At this time we will continue to follow her carotid stenosis with a repeat carotid duplex in 1 year.     Mosetta PigeonEmma Maureen Matvey Llanas PA-C [facility] [place] Office:  [phone]  [facility]

## 2018-08-18 ENCOUNTER — Ambulatory Visit (INDEPENDENT_AMBULATORY_CARE_PROVIDER_SITE_OTHER): Payer: Medicare Other | Admitting: Neurology

## 2018-08-18 DIAGNOSIS — G43719 Chronic migraine without aura, intractable, without status migrainosus: Secondary | ICD-10-CM

## 2018-08-18 MED ORDER — FREMANEZUMAB-VFRM 225 MG/1.5ML ~~LOC~~ SOSY: 225.0000 mg | PREFILLED_SYRINGE | SUBCUTANEOUS | 11 refills | Status: DC

## 2018-08-18 NOTE — Addendum Note (Signed)
Addended by: Naomie Dean B on: 08/18/2018 10:45 AM   Modules accepted: Orders

## 2018-08-18 NOTE — Progress Notes (Signed)
Botox- 100 units x 2 vials Lot: [id number] Expiration: 12/2020 NDC: 5465-0354-65  Bacteriostatic 0.9% Sodium Chloride- 17mL total Lot: [id number] Expiration: 04/19/2019 NDC: 1700-1749-44  Dx: H67.591 S/P

## 2018-08-18 NOTE — Progress Notes (Addendum)
+ masseters 10u each. injext 3 ajovy q17months may need booster along the way she will email with updates gave 12 samples.  Dry needling: cervical myofascial pain    Consent Form Botulism Toxin Injection For Chronic Migraine    Reviewed orally with patient, additionally signature is on file:  Botulism toxin has been approved by the Federal drug administration for treatment of chronic migraine. Botulism toxin does not cure chronic migraine and it may not be effective in some patients.  The administration of botulism toxin is accomplished by injecting a small amount of toxin into the muscles of the neck and head. Dosage must be titrated for each individual. Any benefits resulting from botulism toxin tend to wear off after 3 months with a repeat injection required if benefit is to be maintained. Injections are usually done every 3-4 months with maximum effect peak achieved by about 2 or 3 weeks. Botulism toxin is expensive and you should be sure of what costs you will incur resulting from the injection.  The side effects of botulism toxin use for chronic migraine may include:   -Transient, and usually mild, facial weakness with facial injections  -Transient, and usually mild, head or neck weakness with head/neck injections  -Reduction or loss of forehead facial animation due to forehead muscle weakness  -Eyelid drooping  -Dry eye  -Pain at the site of injection or bruising at the site of injection  -Double vision  -Potential unknown long term risks  Contraindications: You should not have Botox if you are pregnant, nursing, allergic to albumin, have an infection, skin condition, or muscle weakness at the site of the injection, or have myasthenia gravis, Lambert-Eaton syndrome, or ALS.  It is also possible that as with any injection, there may be an allergic reaction or no effect from the medication. Reduced effectiveness after repeated injections is sometimes seen and rarely infection at the  injection site may occur. All care will be taken to prevent these side effects. If therapy is given over a long time, atrophy and wasting in the muscle injected may occur. Occasionally the patient's become refractory to treatment because they develop antibodies to the toxin. In this event, therapy needs to be modified.  I have read the above information and consent to the administration of botulism toxin.    BOTOX PROCEDURE NOTE FOR MIGRAINE HEADACHE    Contraindications and precautions discussed with patient(above). Aseptic procedure was observed and patient tolerated procedure. Procedure performed by Dr. Artemio Aly  The condition has existed for more than 6 months, and pt does not have a diagnosis of ALS, Myasthenia Gravis or Lambert-Eaton Syndrome.  Risks and benefits of injections discussed and pt agrees to proceed with the procedure.  Written consent obtained  These injections are medically necessary. Pt  receives good benefits from these injections. These injections do not cause sedations or hallucinations which the oral therapies may cause.  Indication/Diagnosis: chronic migraine BOTOX(J0585) injection was performed according to protocol by [employer]. 200 units of BOTOX was dissolved into 4 cc NS.   NDC: [address]-65   Description of procedure:  The patient was placed in a sitting position. The standard protocol was used for Botox as follows, with 5 units of Botox injected at each site:   -Procerus muscle, midline injection  -Corrugator muscle, bilateral injection  -Frontalis muscle, bilateral injection, with 2 sites each side, medial injection was performed in the upper one third of the frontalis muscle, in the region vertical from the medial inferior edge of the  superior orbital rim. The lateral injection was again in the upper one third of the forehead vertically above the lateral limbus of the cornea, 1.5 cm lateral to the medial injection site.  -Temporalis muscle  injection, 4 sites, bilaterally. The first injection was 3 cm above the tragus of the ear, second injection site was 1.5 cm to 3 cm up from the first injection site in line with the tragus of the ear. The third injection site was 1.5-3 cm forward between the first 2 injection sites. The fourth injection site was 1.5 cm posterior to the second injection site.  -Occipitalis muscle injection, 3 sites, bilaterally. The first injection was done one half way between the occipital protuberance and the tip of the mastoid process behind the ear. The second injection site was done lateral and superior to the first, 1 fingerbreadth from the first injection. The third injection site was 1 fingerbreadth superiorly and medially from the first injection site.  -Cervical paraspinal muscle injection, 2 sites, bilateral knee first injection site was 1 cm from the midline of the cervical spine, 3 cm inferior to the lower border of the occipital protuberance. The second injection site was 1.5 cm superiorly and laterally to the first injection site.  -Trapezius muscle injection was performed at 3 sites, bilaterally. The first injection site was in the upper trapezius muscle halfway between the inflection point of the neck, and the acromion. The second injection site was one half way between the acromion and the first injection site. The third injection was done between the first injection site and the inflection point of the neck.   Will return for repeat injection in 3 months.   A 200 unit sof Botox was used, 155 units were injected, the rest of the Botox was wasted. The patient tolerated the procedure well, there were no complications of the above procedure.

## 2018-08-29 ENCOUNTER — Other Ambulatory Visit: Payer: Self-pay

## 2018-08-29 ENCOUNTER — Ambulatory Visit: Payer: Medicare Other | Attending: Neurology | Admitting: Physical Therapy

## 2018-08-29 ENCOUNTER — Encounter: Payer: Self-pay | Admitting: Physical Therapy

## 2018-08-29 DIAGNOSIS — R51 Headache: Secondary | ICD-10-CM | POA: Diagnosis present

## 2018-08-29 DIAGNOSIS — M542 Cervicalgia: Secondary | ICD-10-CM | POA: Diagnosis present

## 2018-08-29 DIAGNOSIS — G8929 Other chronic pain: Secondary | ICD-10-CM

## 2018-08-29 DIAGNOSIS — R293 Abnormal posture: Secondary | ICD-10-CM | POA: Insufficient documentation

## 2018-08-29 DIAGNOSIS — R519 Headache, unspecified: Secondary | ICD-10-CM

## 2018-08-29 NOTE — Therapy (Signed)
[facility] Outpatient Rehabilitation [facility] [address] [place], [place], [address] Phone: [phone]   Fax:  [phone]  Physical Therapy Evaluation  Patient Details  Name: Brianna [name] MRN: [id number] Date of Birth: 1951/05/17 Referring Provider (PT): Naomie Dean MD   Encounter Date: 08/29/2018  PT End of Session - 08/29/18 1117    Visit Number  1    Number of Visits  13    Date for PT Re-Evaluation  10/10/18    Authorization Type  MCR: kx mod by 15th visit, progress note at 10th visit    PT Start Time  1015    PT Stop Time  1110    PT Time Calculation (min)  55 min    Activity Tolerance  Patient tolerated treatment well    Behavior During Therapy  [facility] for tasks assessed/performed       Past Medical History:  Diagnosis Date  . Arthritis    in the neck  . Carotid artery occlusion   . Epilepsy (HCC)   . GERD (gastroesophageal reflux disease)   . Glaucoma   . Hyperlipidemia   . Hypertension   . Keratoconus of both eyes 1981  . Macular degeneration   . Migraines   . Neuromuscular disorder (HCC)    RA  . Peripheral vascular disease (HCC)    carotid occlusion surgery on left  . Raynaud's disease   . Retinal edema   . Rheumatoid arthritis(714.0)   . Stroke [facility])    two mild strokes presumed from left carotid stenosis    Past Surgical History:  Procedure Laterality Date  . CAROTID ARTERY ANGIOPLASTY  2008   Dr Madilyn Fireman  . CAROTID ENDARTERECTOMY Left 11-15-2007  . cataract extraction Right 04-2014   [facility]  . CHOLECYSTECTOMY  1995  . CORNEAL TRANSPLANT     x 5 ; steroid inj. retnal information  . CORNEAL TRANSPLANT Right 01-22-2014   [facility]  . EYE SURGERY Right 12/2016   cornea repair   . EYE SURGERY Right 09/20/2017   cornea transplant   . GLAUCOMA SURGERY Right 2018  . TRIGGER FINGER RELEASE Right 05/16/2018   Procedure: RELEASE TRIGGER FINGER/A-1 PULLEY RIGHT THUMB;  Surgeon: Cindee Salt, MD;  Location: [facility];  Service: Orthopedics;  Laterality: Right;    There were no vitals filed for this visit.   Subjective Assessment - 08/29/18 1032    Subjective  pt is a 68 y.o F with CC of migraines and neck pain that chronic. pt reports recent exacerbation starting about 2 weeks ago with no specific onset stating she woke one day and couldn't turn to the left. she reports HA and tightness in the neck / shoulders. Since onset the neck has remained the same since onset with stiffness. She reports having DN previously which helped significantly    Limitations  Lifting    How long can you sit comfortably?  30 min    How long can you stand comfortably?  8 hours    How long can you walk comfortably?  unlimited    Diagnostic tests  N/A    Patient Stated Goals  reduce the knots in the neck/ shoulder, have good positioning with laying down, reduce pain    Currently in Pain?  Yes    Pain Score  7    at worst 10/10   Pain Location  Neck    Pain Orientation  Left    Pain Descriptors / Indicators  Burning  Pain Type  --   acute on chronic   Pain Radiating Towards  down bil UE to the wrist    Pain Onset  1 to 4 weeks ago    Pain Frequency  Constant    Aggravating Factors   lifting, sitting without having the arms propped up    Pain Relieving Factors  moving around, using pillow to prop,     Effect of Pain on Daily Activities  limited sitting/ lifting with bil UE and         [facility] PT Assessment - 08/29/18 1030      Assessment   Medical Diagnosis  Migraines    Referring Provider (PT)  Naomie DeanAntonia Ahern MD    Onset Date/Surgical Date  --   2 weeks    Hand Dominance  Right    Next MD Visit  11/20/2018    Prior Therapy  yes      Precautions   Precautions  None      Restrictions   Weight Bearing Restrictions  No      Balance Screen   Has the patient fallen in the past 6 months  No      Home Environment   Living Environment  Private residence    Living Arrangements  Spouse/significant  other    Available Help at Discharge  Family    Type of Home  House    Home Access  Stairs to enter    Entrance Stairs-Number of Steps  3    Entrance Stairs-Rails  None    Home Layout  Two level    Alternate Level Stairs-Number of Steps  12    Alternate Level Stairs-Rails  Right;Left      Prior Function   Level of Independence  Independent with basic ADLs    Vocation  Retired;Volunteer work   with pediatrics at [employer]   Vocation Requirements  standing/ walking, lifting    Leisure  reading (audible books), gardening, traveling      Cognition   Overall Cognitive Status  Within Functional Limits for tasks assessed      Observation/Other Assessments   Focus on Therapeutic Outcomes (FOTO)   51% limited   predicted 36% limited     Posture/Postural Control   Posture/Postural Control  Postural limitations    Postural Limitations  Rounded Shoulders;Forward head      ROM / Strength   AROM / PROM / Strength  AROM;Strength      AROM   Overall AROM Comments  all other shoulder AROM are WFL bil    AROM Assessment Site  Cervical;Shoulder    Right/Left Shoulder  Right;Left    Right Shoulder ABduction  162 Degrees    Left Shoulder ABduction  150 Degrees    Cervical Flexion  42    Cervical Extension  60    Cervical - Right Side Bend  48    Cervical - Left Side Bend  48    Cervical - Right Rotation  68    Cervical - Left Rotation  67   increased stiffness noted during motion     Strength   Overall Strength Comments  overall shoulder strength Grossly 4/5    Strength Assessment Site  Shoulder      Palpation   Palpation comment  TTP noted in bil upper trap/ levator scapulae and Rhomboids L>R and bil scapular winging type 1                Objective measurements completed on  examination: See above findings.      [facility] Adult PT Treatment/Exercise - 08/29/18 1030      Exercises   Exercises  Neck      Neck Exercises: Supine   Neck Retraction  5 reps;5 secs    Capital  Flexion  5 reps;5 secs   with sustained chin tuck   Other Supine Exercise  ceiling punches      Manual Therapy   Manual Therapy  Other (comment);Joint mobilization    Manual therapy comments  skilled palpation and monitoring of pt throughout TPDN    Joint Mobilization  T4-T6 L rib springing mobs grade III,     Other Manual Therapy  L upper trap inhibition taping (trial)   pt noted decreased tension/ promoting stability     Neck Exercises: Stretches   Upper Trapezius Stretch  30 seconds;1 rep;Right;Left    Levator Stretch  30 seconds;1 rep;Right;Left       Trigger Point Dry Needling - 08/29/18 1134    Consent Given?  Yes    Education Handout Provided  Yes    Muscles Treated Upper Body  Upper trapezius;Levator scapulae;Rhomboids    Upper Trapezius Response  Twitch reponse elicited;Palpable increased muscle length   L only   Levator Scapulae Response  Twitch response elicited;Palpable increased muscle length   L only   Rhomboids Response  Twitch response elicited;Palpable increased muscle length   L only          PT Education - 08/29/18 1117    Education Details  evaluation findings, POC, goals, HEP with proper form/ rationale. reviewed DN benefits and after care    Person(s) Educated  Patient    Methods  Explanation;Verbal cues;Handout    Comprehension  Verbalized understanding;Verbal cues required       PT Short Term Goals - 08/29/18 1129      PT SHORT TERM GOAL #1   Title  pt to be I with initial HEP    Time  3    Period  Weeks    Status  New    Target Date  09/19/18        PT Long Term Goals - 08/29/18 1129      PT LONG TERM GOAL #1   Title  pt to demo proper posture and lifting mechancis to reduce and prevent neck/ shoulder pain     Time  6    Period  Weeks    Status  New    Target Date  10/10/18      PT LONG TERM GOAL #2   Title  report decreased spasm inthe upper traps and surrounding musculature to reduce pain to </= 2/10 and maintain cerivcal  mobility    Time  6    Period  Weeks    Status  New    Target Date  10/10/18      PT LONG TERM GOAL #3   Title  pt to return to the gym and report decreased HA occurence by >/= 1 week for pt goals and promote QOL    Time  6    Period  Weeks    Status  New    Target Date  10/10/18      PT LONG TERM GOAL #4   Title  increase FOTO score to </= 36% limited to demo improvement in function    Time  6    Period  Weeks    Status  New    Target Date  10/10/18  PT LONG TERM GOAL #5   Title  pt to be I with all HEP given as of last visit to maintain and progress current level of function    Time  6    Period  Weeks    Status  New    Target Date  10/10/18             Plan - 08/29/18 1118    Clinical Impression Statement  pt present to OPPT with CC of neck pain and acute on chronic intractable HA starting about 2 weeks ago. she demonstates functional neck mobility with stiffness noted with L ROT and R sidebending. TTP noted in bil upper trap/ levator scapulae and Rhomboids L>R and bil scapular winging. educated about TPDN and consent was provided along the L upper trap/ levator scapulae and Rhomboids followed with IASMT and thoracic/ rib mobs. she noted improvement of HA and neck mobility with decreased stiffness to the L. She would benefit from physical therapy to reduce muscle tension, promote cervical mobility, promote efficent posture and maximize her function by addressing the deficits listed.     History and Personal Factors relevant to plan of care:  hx of seziures, stroke, and migraines    Clinical Presentation  Evolving    Clinical Presentation due to:  muscle spasm, intermittent HA with worsening nature, abnormal posture and scapular positioning    Clinical Decision Making  Moderate    Rehab Potential  Good    PT Frequency  2x / week    PT Duration  6 weeks    PT Treatment/Interventions  ADLs/Self Care Home Management;Cryotherapy;Electrical Stimulation;Iontophoresis  4mg /ml Dexamethasone;Moist Heat;Traction;Ultrasound;Therapeutic exercise;Therapeutic activities;Dry needling;Taping;Patient/family education;Manual techniques;Neuromuscular re-education    PT Next Visit Plan  review/ update HEP, response to DN for upper trap/ levator, add sub-occipitals. L thoracic rib mobs, peri-scapular strengthening    PT Home Exercise Plan  chin tucks progressing to chin tuck head lift, upper trap/ levator scapulae stretch, ceiling punches,     Consulted and Agree with Plan of Care  Patient       Patient will benefit from skilled therapeutic intervention in order to improve the following deficits and impairments:  Pain, Increased fascial restricitons, Decreased strength, Decreased activity tolerance, Improper body mechanics, Postural dysfunction, Impaired UE functional use, Decreased endurance  Visit Diagnosis: Cervicalgia  Chronic nonintractable headache, unspecified headache type  Abnormal posture     Problem List Patient Active Problem List   Diagnosis Date Noted  . URI, acute 05/19/2018  . Cerumen impaction 05/19/2018  . Trigger thumb, right thumb 10/05/2017  . History of macular degeneration 09/13/2016  . History of migraine 09/06/2016  . History of seizures 09/06/2016  . Iritis of right eye 05/12/2016  . Rheumatoid arthritis with positive rheumatoid factor (HCC) 05/11/2016  . 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  . High risk medication use 05/11/2016  . Osteoporosis 05/11/2016  . 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 History of carotid endarterectomy 06/27/2014  . Migraine without aura 04/19/2013  . Seizures (HCC) 04/19/2013  . 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 Cough 01/14/2011  . Pulmonary fibrosis (HCC) 01/14/2011   Lulu Riding PT, DPT, LAT, ATC  08/29/18  11:34 AM      [facility] Outpatient Rehabilitation [facility] [address] [place], [place], [address] Phone:  [phone]   Fax:  [phone]  Name: Brianna [name] MRN: [id number] Date of Birth: 1951-06-27

## 2018-08-31 ENCOUNTER — Ambulatory Visit: Payer: Medicare Other

## 2018-09-06 ENCOUNTER — Encounter: Payer: Self-pay | Admitting: Physical Therapy

## 2018-09-06 ENCOUNTER — Ambulatory Visit: Payer: Medicare Other | Admitting: Physical Therapy

## 2018-09-06 DIAGNOSIS — M542 Cervicalgia: Secondary | ICD-10-CM

## 2018-09-06 DIAGNOSIS — G8929 Other chronic pain: Secondary | ICD-10-CM

## 2018-09-06 DIAGNOSIS — R51 Headache: Secondary | ICD-10-CM

## 2018-09-06 DIAGNOSIS — R293 Abnormal posture: Secondary | ICD-10-CM

## 2018-09-06 NOTE — Therapy (Signed)
[facility] Outpatient Rehabilitation [facility] [address] [place], [place], [address] Phone: [phone]   Fax:  [phone]  Physical Therapy Treatment  Patient Details  Name: Brianna [name] MRN: [id number] Date of Birth: 1950-07-26 Referring Provider (PT): Naomie Dean MD   Encounter Date: 09/06/2018  PT End of Session - 09/06/18 0936    Visit Number  2    Number of Visits  13    Date for PT Re-Evaluation  10/10/18    Authorization Type  MCR: kx mod by 15th visit, progress note at 10th visit    PT Start Time  0933    PT Stop Time  1014    PT Time Calculation (min)  41 min    Activity Tolerance  Patient limited by pain    Behavior During Therapy  [facility] for tasks assessed/performed       Past Medical History:  Diagnosis Date  . Arthritis    in the neck  . Carotid artery occlusion   . Epilepsy (HCC)   . GERD (gastroesophageal reflux disease)   . Glaucoma   . Hyperlipidemia   . Hypertension   . Keratoconus of both eyes 1981  . Macular degeneration   . Migraines   . Neuromuscular disorder (HCC)    RA  . Peripheral vascular disease (HCC)    carotid occlusion surgery on left  . Raynaud's disease   . Retinal edema   . Rheumatoid arthritis(714.0)   . Stroke [facility])    two mild strokes presumed from left carotid stenosis    Past Surgical History:  Procedure Laterality Date  . CAROTID ARTERY ANGIOPLASTY  2008   Dr Madilyn Fireman  . CAROTID ENDARTERECTOMY Left 11-15-2007  . cataract extraction Right 04-2014   [facility]  . CHOLECYSTECTOMY  1995  . CORNEAL TRANSPLANT     x 5 ; steroid inj. retnal information  . CORNEAL TRANSPLANT Right 01-22-2014   [facility]  . EYE SURGERY Right 12/2016   cornea repair   . EYE SURGERY Right 09/20/2017   cornea transplant   . GLAUCOMA SURGERY Right 2018  . TRIGGER FINGER RELEASE Right 05/16/2018   Procedure: RELEASE TRIGGER FINGER/A-1 PULLEY RIGHT THUMB;  Surgeon: Cindee Salt, MD;  Location:  [facility];  Service: Orthopedics;  Laterality: Right;    There were no vitals filed for this visit.  Subjective Assessment - 09/06/18 0936    Subjective  " I felt really good after the last session, I did get a HA a day or two ago when the weather"    Patient Stated Goals  reduce the knots in the neck/ shoulder, have good positioning with laying down, reduce pain    Currently in Pain?  Yes    Pain Score  5     Pain Orientation  Left    Pain Descriptors / Indicators  Burning    Pain Type  Chronic pain    Pain Onset  More than a month ago    Pain Frequency  Intermittent    Aggravating Factors   lifting, sitting without having the arms propped up    Pain Relieving Factors  moving around.                        [facility] Adult PT Treatment/Exercise - 09/06/18 0001      Self-Care   Self-Care  Other Self-Care Comments    Other Self-Care Comments   sub-occipital release techniques using tennis balls,  Shoulder Exercises: Seated   Other Seated Exercises  lower trap wall Y's 2 x 10       Manual Therapy   Manual Therapy  Soft tissue mobilization    Manual therapy comments  skilled palpation and monitoring of pt throughout TPDN    Joint Mobilization  T4-T6 L rib springing mobs grade III, C3-C7 PA grade III, bil rib mobs grade III    Soft tissue mobilization  IASTM along L upper trap, levator scapulae    Other Manual Therapy  bil upper trap inhibition taping      Neck Exercises: Stretches   Upper Trapezius Stretch  30 seconds;1 rep;Right;Left    Levator Stretch  30 seconds;1 rep;Right;Left       Trigger Point Dry Needling - 09/06/18 0940    Consent Given?  Yes    Education Handout Provided  Yes    Upper Trapezius Response  Twitch reponse elicited;Palpable increased muscle length    Levator Scapulae Response  Twitch response elicited;Palpable increased muscle length    Rhomboids Response  Twitch response elicited;Palpable increased muscle length           PT  Education - 09/06/18 1121    Education Details  MTPR release for sub-occipital release using tennis balls.     Person(s) Educated  Patient    Methods  Verbal cues;Explanation;Demonstration    Comprehension  Verbalized understanding;Verbal cues required       PT Short Term Goals - 08/29/18 1129      PT SHORT TERM GOAL #1   Title  pt to be I with initial HEP    Time  3    Period  Weeks    Status  New    Target Date  09/19/18        PT Long Term Goals - 08/29/18 1129      PT LONG TERM GOAL #1   Title  pt to demo proper posture and lifting mechancis to reduce and prevent neck/ shoulder pain     Time  6    Period  Weeks    Status  New    Target Date  10/10/18      PT LONG TERM GOAL #2   Title  report decreased spasm inthe upper traps and surrounding musculature to reduce pain to </= 2/10 and maintain cerivcal mobility    Time  6    Period  Weeks    Status  New    Target Date  10/10/18      PT LONG TERM GOAL #3   Title  pt to return to the gym and report decreased HA occurence by >/= 1 week for pt goals and promote QOL    Time  6    Period  Weeks    Status  New    Target Date  10/10/18      PT LONG TERM GOAL #4   Title  increase FOTO score to </= 36% limited to demo improvement in function    Time  6    Period  Weeks    Status  New    Target Date  10/10/18      PT LONG TERM GOAL #5   Title  pt to be I with all HEP given as of last visit to maintain and progress current level of function    Time  6    Period  Weeks    Status  New    Target Date  10/10/18  Plan - 09/06/18 1119    Clinical Impression Statement  pt reports consistency with her HEp and reports improvement of pain/ hA intensity. continued TPDN focusing on L upper trap/ levator scapulae with thoracic and rib mobs. educated with regarding MTPR release techniques.  worked on taping bil upper trap to provide relief. pt reported decreased pain at end of session and no HA.     PT  Treatment/Interventions  ADLs/Self Care Home Management;Cryotherapy;Electrical Stimulation;Iontophoresis 4mg /ml Dexamethasone;Moist Heat;Traction;Ultrasound;Therapeutic exercise;Therapeutic activities;Dry needling;Taping;Patient/family education;Manual techniques;Neuromuscular re-education    PT Next Visit Plan  review/ update HEP, response to DN for upper trap/ levator, add sub-occipitals. L thoracic rib mobs, peri-scapular strengthening    PT Home Exercise Plan  chin tucks progressing to chin tuck head lift, upper trap/ levator scapulae stretch, ceiling punches,     Consulted and Agree with Plan of Care  Patient       Patient will benefit from skilled therapeutic intervention in order to improve the following deficits and impairments:  Pain, Increased fascial restricitons, Decreased strength, Decreased activity tolerance, Improper body mechanics, Postural dysfunction, Impaired UE functional use, Decreased endurance  Visit Diagnosis: Cervicalgia  Chronic nonintractable headache, unspecified headache type  Abnormal posture     Problem List Patient Active Problem List   Diagnosis Date Noted  . URI, acute 05/19/2018  . Cerumen impaction 05/19/2018  . Trigger thumb, right thumb 10/05/2017  . History of macular degeneration 09/13/2016  . History of migraine 09/06/2016  . History of seizures 09/06/2016  . Iritis of right eye 05/12/2016  . Rheumatoid arthritis with positive rheumatoid factor (HCC) 05/11/2016  . 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  . High risk medication use 05/11/2016  . Osteoporosis 05/11/2016  . 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 History of carotid endarterectomy 06/27/2014  . Migraine without aura 04/19/2013  . Seizures (HCC) 04/19/2013  . 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 Cough 01/14/2011  . Pulmonary fibrosis (HCC) 01/14/2011   Lulu Riding PT, DPT, LAT, ATC  09/06/18  11:23 AM      [facility]  Health Outpatient Rehabilitation [facility] [address] [place], [place], [address] Phone: [phone]   Fax:  [phone]  Name: Brianna [name] MRN: [id number] Date of Birth: 1950/10/07

## 2018-09-08 ENCOUNTER — Ambulatory Visit: Payer: Medicare Other | Admitting: Physical Therapy

## 2018-09-08 ENCOUNTER — Encounter: Payer: Self-pay | Admitting: Physical Therapy

## 2018-09-08 DIAGNOSIS — M542 Cervicalgia: Secondary | ICD-10-CM

## 2018-09-08 DIAGNOSIS — G8929 Other chronic pain: Secondary | ICD-10-CM

## 2018-09-08 DIAGNOSIS — R51 Headache: Secondary | ICD-10-CM

## 2018-09-08 DIAGNOSIS — R293 Abnormal posture: Secondary | ICD-10-CM

## 2018-09-08 NOTE — Therapy (Signed)
[facility] [address] [place], [place], [address] Phone: [phone]   Fax:  [phone]  Physical Therapy Treatment  Patient Details  Name: Brianna Neeramela A [name] MRN: [id number] Date of Birth: October 12, 1950 Referring Provider (PT): Naomie DeanAntonia Ahern MD   Encounter Date: 09/08/2018  PT End of Session - 09/08/18 0830    Visit Number  3    Number of Visits  13    Date for PT Re-Evaluation  10/10/18    Authorization Type  MCR: kx mod by 15th visit, progress note at 10th visit    PT Start Time  0800    PT Stop Time  0841    PT Time Calculation (min)  41 min    Activity Tolerance  Patient tolerated treatment well    Behavior During Therapy  [facility] for tasks assessed/performed       Past Medical History:  Diagnosis Date  . Arthritis    in the neck  . Carotid artery occlusion   . Epilepsy (HCC)   . GERD (gastroesophageal reflux disease)   . Glaucoma   . Hyperlipidemia   . Hypertension   . Keratoconus of both eyes 1981  . Macular degeneration   . Migraines   . Neuromuscular disorder (HCC)    RA  . Peripheral vascular disease (HCC)    carotid occlusion surgery on left  . Raynaud's disease   . Retinal edema   . Rheumatoid arthritis(714.0)   . Stroke [facility](HCC)    two mild strokes presumed from left carotid stenosis    Past Surgical History:  Procedure Laterality Date  . CAROTID ARTERY ANGIOPLASTY  2008   Dr Madilyn FiremanHayes  . CAROTID ENDARTERECTOMY Left 11-15-2007  . [facility]  . CHOLECYSTECTOMY  1995  . CORNEAL TRANSPLANT     x 5 ; steroid inj. retnal information  . [facility]  . EYE SURGERY Right 12/2016   cornea repair   . EYE SURGERY Right 09/20/2017   cornea transplant   . GLAUCOMA SURGERY Right 2018  . TRIGGER FINGER RELEASE Right 05/16/2018   Procedure: RELEASE TRIGGER FINGER/A-1 PULLEY RIGHT THUMB;  Surgeon: Cindee SaltKuzma, Gary, MD;  Location: [facility];  Service: Orthopedics;  Laterality: Right;    There were no vitals filed for this visit.  Subjective Assessment - 09/08/18 0801    Subjective  " I am doing better still some soreness and tightness in the neck on bothsides"     Patient Stated Goals  reduce the knots in the neck/ shoulder, have good positioning with laying down, reduce pain    Currently in Pain?  Yes    Pain Location  Neck    Pain Orientation  Left    Pain Descriptors / Indicators  Aching;Sore    Pain Type  Chronic pain    Pain Onset  More than a month ago    Pain Frequency  Intermittent                       [facility] Adult PT Treatment/Exercise - 09/08/18 0814      Self-Care   Self-Care  Posture    Posture  proper sitting posture utilizing anterior pelvic itlt    Other Self-Care Comments   MTPR using theracan and similar tools      Neck Exercises: Machines for Strengthening   UBE (Upper Arm Bike)  L1 x 4 min  changing direction at 2 min     Neck Exercises: Seated   Other Seated Exercise  rib mobs with strap holding 3 seconds x 10 on the L, added cervical side bending     Other Seated Exercise  SNAGs 2 x 10 holding 3 secon ea.   cues for proper form     Lumbar Exercises: Seated   Other Seated Lumbar Exercises  anterior pelvic tilt 1 x 10 holding 5 seconds ea. with abominal draw in, sustianed anterior pelvic tilt with marching 2 x 10   seated on red phyisoball     Shoulder Exercises: Seated   Protraction  Strengthening;10 reps;Theraband    Theraband Level (Shoulder Protraction)  Level 2 (Red)    Protraction Limitations  while seated on red physioball    Horizontal ABduction  Strengthening;10 reps;Theraband    Theraband Level (Shoulder Horizontal ABduction)  Level 2 (Red)    Horizontal ABduction Limitations  seated on red physioball to incorporate core activation      Manual Therapy   Joint Mobilization  rib mobs grade III with pt breathing in/ out deeply      Neck Exercises:  Stretches   Upper Trapezius Stretch  2 reps;30 seconds    Levator Stretch  2 reps;30 seconds             PT Education - 09/08/18 0828    Education Details  updated HEP for cervical mobs and rib mobs, proper posture.     Person(s) Educated  Patient    Methods  Explanation;Verbal cues;Handout    Comprehension  Verbalized understanding;Verbal cues required       PT Short Term Goals - 08/29/18 1129      PT SHORT TERM GOAL #1   Title  pt to be I with initial HEP    Time  3    Period  Weeks    Status  New    Target Date  09/19/18        PT Long Term Goals - 08/29/18 1129      PT LONG TERM GOAL #1   Title  pt to demo proper posture and lifting mechancis to reduce and prevent neck/ shoulder pain     Time  6    Period  Weeks    Status  New    Target Date  10/10/18      PT LONG TERM GOAL #2   Title  report decreased spasm inthe upper traps and surrounding musculature to reduce pain to </= 2/10 and maintain cerivcal mobility    Time  6    Period  Weeks    Status  New    Target Date  10/10/18      PT LONG TERM GOAL #3   Title  pt to return to the gym and report decreased HA occurence by >/= 1 week for pt goals and promote QOL    Time  6    Period  Weeks    Status  New    Target Date  10/10/18      PT LONG TERM GOAL #4   Title  increase FOTO score to </= 36% limited to demo improvement in function    Time  6    Period  Weeks    Status  New    Target Date  10/10/18      PT LONG TERM GOAL #5   Title  pt to be I with all HEP given as of last visit to maintain  and progress current level of function    Time  6    Period  Weeks    Status  New    Target Date  10/10/18            Plan - 09/08/18 0847    Clinical Impression Statement  pt reports improvement of neck pain and stiffness. held off on TPDN focusing on manual techniques. udpated HEP for cerivcal snags and rib mobs which she noted improvement of stiffness with. address posture education and core  strengthening with addition of peri-scapular activation. pt noted decreased stiffness/ soreness at end of session.     PT Treatment/Interventions  ADLs/Self Care Home Management;Cryotherapy;Electrical Stimulation;Iontophoresis 4mg /ml Dexamethasone;Moist Heat;Traction;Ultrasound;Therapeutic exercise;Therapeutic activities;Dry needling;Taping;Patient/family education;Manual techniques;Neuromuscular re-education    PT Next Visit Plan  review/ update HEP, response to DN for upper trap/ levator, add sub-occipitals. L thoracic rib mobs, peri-scapular strengthening    PT Home Exercise Plan  chin tucks progressing to chin tuck head lift, upper trap/ levator scapulae stretch, ceiling punches, proper sitting posture, anterior pelvic tilt, rib mobs with towel, SNAGS       Patient will benefit from skilled therapeutic intervention in order to improve the following deficits and impairments:  Pain, Increased fascial restricitons, Decreased strength, Decreased activity tolerance, Improper body mechanics, Postural dysfunction, Impaired UE functional use, Decreased endurance  Visit Diagnosis: Cervicalgia  Chronic nonintractable headache, unspecified headache type  Abnormal posture     Problem List Patient Active Problem List   Diagnosis Date Noted  . URI, acute 05/19/2018  . Cerumen impaction 05/19/2018  . Trigger thumb, right thumb 10/05/2017  . History of macular degeneration 09/13/2016  . History of migraine 09/06/2016  . History of seizures 09/06/2016  . Iritis of right eye 05/12/2016  . Rheumatoid arthritis with positive rheumatoid factor (HCC) 05/11/2016  . 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  . High risk medication use 05/11/2016  . Osteoporosis 05/11/2016  . 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 History of carotid endarterectomy 06/27/2014  . Migraine without aura 04/19/2013  . Seizures (HCC) 04/19/2013  . 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  Cough 01/14/2011  . Pulmonary fibrosis (HCC) 01/14/2011   Lulu RidingKristoffer Lon Klippel PT, DPT, LAT, ATC  09/08/18  8:50 AM      [facility] [address] [place], [place], [address] Phone: [phone]   Fax:  [phone]  Name: Brianna Neeramela A [name] MRN: [id number] Date of Birth: [birth year]

## 2018-09-11 ENCOUNTER — Ambulatory Visit: Payer: Medicare Other | Admitting: Physical Therapy

## 2018-09-11 ENCOUNTER — Encounter: Payer: Self-pay | Admitting: Physical Therapy

## 2018-09-11 DIAGNOSIS — R293 Abnormal posture: Secondary | ICD-10-CM

## 2018-09-11 DIAGNOSIS — M542 Cervicalgia: Secondary | ICD-10-CM | POA: Diagnosis not present

## 2018-09-11 DIAGNOSIS — R51 Headache: Principal | ICD-10-CM

## 2018-09-11 DIAGNOSIS — G8929 Other chronic pain: Secondary | ICD-10-CM

## 2018-09-11 NOTE — Therapy (Signed)
[facility] [address] [place], [place], [address] Phone: [phone]   Fax:  [phone]  Physical Therapy Treatment  Patient Details  Name: Brianna [name] MRN: [id number] Date of Birth: July 26, 1950 Referring Provider (PT): Naomie Dean MD   Encounter Date: 09/11/2018  PT End of Session - 09/11/18 0813    Visit Number  4    Number of Visits  13    Date for PT Re-Evaluation  10/10/18    Authorization Type  MCR: kx mod by 15th visit, progress note at 10th visit    PT Start Time  0813   pt arrived 13 min late   PT Stop Time  0847    PT Time Calculation (min)  34 min    Activity Tolerance  Patient tolerated treatment well    Behavior During Therapy  [facility] for tasks assessed/performed       Past Medical History:  Diagnosis Date  . Arthritis    in the neck  . Carotid artery occlusion   . Epilepsy (HCC)   . GERD (gastroesophageal reflux disease)   . Glaucoma   . Hyperlipidemia   . Hypertension   . Keratoconus of both eyes 1981  . Macular degeneration   . Migraines   . Neuromuscular disorder (HCC)    RA  . Peripheral vascular disease (HCC)    carotid occlusion surgery on left  . Raynaud's disease   . Retinal edema   . Rheumatoid arthritis(714.0)   . Stroke [facility])    two mild strokes presumed from left carotid stenosis    Past Surgical History:  Procedure Laterality Date  . CAROTID ARTERY ANGIOPLASTY  2008   Dr Madilyn Fireman  . CAROTID ENDARTERECTOMY Left 11-15-2007  . cataract extraction Right 04-2014   [facility]  . CHOLECYSTECTOMY  1995  . CORNEAL TRANSPLANT     x 5 ; steroid inj. retnal information  . CORNEAL TRANSPLANT Right 01-22-2014   [facility]  . EYE SURGERY Right 12/2016   cornea repair   . EYE SURGERY Right 09/20/2017   cornea transplant   . GLAUCOMA SURGERY Right 2018  . TRIGGER FINGER RELEASE Right 05/16/2018   Procedure: RELEASE TRIGGER FINGER/A-1 PULLEY RIGHT THUMB;  Surgeon: Cindee Salt, MD;   Location: [facility];  Service: Orthopedics;  Laterality: Right;    There were no vitals filed for this visit.  Subjective Assessment - 09/11/18 0814    Subjective  "I think I did boo-boo on the treadmill with too much speed and elevation. I did some more exercise at the gym"    Patient Stated Goals  reduce the knots in the neck/ shoulder, have good positioning with laying down, reduce pain    Currently in Pain?  Yes    Pain Score  4     Pain Location  Neck    Pain Orientation  Left    Pain Descriptors / Indicators  Sore;Aching    Pain Type  Chronic pain    Pain Onset  More than a month ago    Pain Frequency  Intermittent                       [facility] Adult PT Treatment/Exercise - 09/11/18 0001      Self-Care   Posture  posture handout with lifting mechanics      Shoulder Exercises: ROM/Strengthening   UBE (Upper Arm Bike)  L1 x 4 min    changing direction at  2 min      Manual Therapy   Manual therapy comments  skilled palpation and monitoring of pt throughout TPDN    Joint Mobilization  rib mobs grade III with pt breathing in/ out deeply, T1-T6 PA grade III central mobs    Other Manual Therapy  L upper trap inhibition taping      Neck Exercises: Stretches   Upper Trapezius Stretch  2 reps;30 seconds    Levator Stretch  2 reps;30 seconds       Trigger Point Dry Needling - 09/11/18 0841    Consent Given?  Yes    Education Handout Provided  No   given previously   Muscles Treated Upper Body  Suboccipitals muscle group    Upper Trapezius Response  Twitch reponse elicited;Palpable increased muscle length    SubOccipitals Response  Twitch response elicited;Palpable increased muscle length   L only   Levator Scapulae Response  Twitch response elicited;Palpable increased muscle length           PT Education - 09/11/18 0851    Education Details  updated HEP for posture and lifting mechanics    Person(s) Educated  Patient    Methods   Explanation;Verbal cues;Handout    Comprehension  Verbalized understanding;Verbal cues required       PT Short Term Goals - 08/29/18 1129      PT SHORT TERM GOAL #1   Title  pt to be I with initial HEP    Time  3    Period  Weeks    Status  New    Target Date  09/19/18        PT Long Term Goals - 08/29/18 1129      PT LONG TERM GOAL #1   Title  pt to demo proper posture and lifting mechancis to reduce and prevent neck/ shoulder pain     Time  6    Period  Weeks    Status  New    Target Date  10/10/18      PT LONG TERM GOAL #2   Title  report decreased spasm inthe upper traps and surrounding musculature to reduce pain to </= 2/10 and maintain cerivcal mobility    Time  6    Period  Weeks    Status  New    Target Date  10/10/18      PT LONG TERM GOAL #3   Title  pt to return to the gym and report decreased HA occurence by >/= 1 week for pt goals and promote QOL    Time  6    Period  Weeks    Status  New    Target Date  10/10/18      PT LONG TERM GOAL #4   Title  increase FOTO score to </= 36% limited to demo improvement in function    Time  6    Period  Weeks    Status  New    Target Date  10/10/18      PT LONG TERM GOAL #5   Title  pt to be I with all HEP given as of last visit to maintain and progress current level of function    Time  6    Period  Weeks    Status  New    Target Date  10/10/18            Plan - 09/11/18 0853    Clinical Impression Statement  pt arrived 13 min  late today. she noted increased soreness which is related potentially pushing her self too much on the treadmill using her arms too much. continued TPDN today focusing on L upper trap/ levator scapuale and sub-occipitals followed with cervicothoracic mobs. continued inhibition taping and stretching. educated about proper posture and provided associated handout. end of session she reported decreased pain/ stiffness.     PT Treatment/Interventions  ADLs/Self Care Home  Management;Cryotherapy;Electrical Stimulation;Iontophoresis 4mg /ml Dexamethasone;Moist Heat;Traction;Ultrasound;Therapeutic exercise;Therapeutic activities;Dry needling;Taping;Patient/family education;Manual techniques;Neuromuscular re-education    PT Next Visit Plan  update HEP, response to DN for upper trap/ levator, add sub-occipitals. L thoracic rib mobs, peri-scapular strengthening    PT Home Exercise Plan  chin tucks progressing to chin tuck head lift, upper trap/ levator scapulae stretch, ceiling punches, proper sitting posture, anterior pelvic tilt, rib mobs with towel, SNAGS, posture handout    Consulted and Agree with Plan of Care  Patient       Patient will benefit from skilled therapeutic intervention in order to improve the following deficits and impairments:  Pain, Increased fascial restricitons, Decreased strength, Decreased activity tolerance, Improper body mechanics, Postural dysfunction, Impaired UE functional use, Decreased endurance  Visit Diagnosis: Chronic nonintractable headache, unspecified headache type  Cervicalgia  Abnormal posture     Problem List Patient Active Problem List   Diagnosis Date Noted  . URI, acute 05/19/2018  . Cerumen impaction 05/19/2018  . Trigger thumb, right thumb 10/05/2017  . History of macular degeneration 09/13/2016  . History of migraine 09/06/2016  . History of seizures 09/06/2016  . Iritis of right eye 05/12/2016  . Rheumatoid arthritis with positive rheumatoid factor (HCC) 05/11/2016  . 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  . High risk medication use 05/11/2016  . Osteoporosis 05/11/2016  . 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 History of carotid endarterectomy 06/27/2014  . Migraine without aura 04/19/2013  . Seizures (HCC) 04/19/2013  . 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 Cough 01/14/2011  . Pulmonary fibrosis (HCC) 01/14/2011   Lulu RidingKristoffer Leamon PT, DPT, LAT, ATC  09/11/18   8:57 AM      [facility] [address] [place], [place], [address] Phone: [phone]   Fax:  [phone]  Name: Mayra Neeramela A Munley MRN: [id number] Date of Birth: 09-Jul-1951

## 2018-09-11 NOTE — Patient Instructions (Signed)

## 2018-09-13 ENCOUNTER — Encounter: Payer: Medicare Other | Admitting: Physical Therapy

## 2018-09-19 ENCOUNTER — Ambulatory Visit: Payer: Medicare Other | Attending: Neurology | Admitting: Physical Therapy

## 2018-09-19 ENCOUNTER — Encounter: Payer: Self-pay | Admitting: Physical Therapy

## 2018-09-19 DIAGNOSIS — G8929 Other chronic pain: Secondary | ICD-10-CM

## 2018-09-19 DIAGNOSIS — R293 Abnormal posture: Secondary | ICD-10-CM

## 2018-09-19 DIAGNOSIS — R51 Headache: Secondary | ICD-10-CM | POA: Diagnosis present

## 2018-09-19 DIAGNOSIS — M542 Cervicalgia: Secondary | ICD-10-CM | POA: Insufficient documentation

## 2018-09-19 NOTE — Therapy (Signed)
[facility] [address] [place], [place], [address] Phone: [phone]   Fax:  [phone]  Physical Therapy Treatment  Patient Details  Name: Brianna [name] MRN: [id number] Date of Birth: [birth year] Referring Provider (PT): Naomie DeanAntonia Ahern MD   Encounter Date: 09/19/2018  PT End of Session - 09/19/18 0835    Visit Number  5    Number of Visits  13    Date for PT Re-Evaluation  10/10/18    Authorization Type  MCR: kx mod by 15th visit, progress note at 10th visit    PT Start Time  0835    PT Stop Time  0925    PT Time Calculation (min)  50 min    Activity Tolerance  Patient tolerated treatment well    Behavior During Therapy  [facility] for tasks assessed/performed       Past Medical History:  Diagnosis Date  . Arthritis    in the neck  . Carotid artery occlusion   . Epilepsy (HCC)   . GERD (gastroesophageal reflux disease)   . Glaucoma   . Hyperlipidemia   . Hypertension   . Keratoconus of both eyes 1981  . Macular degeneration   . Migraines   . Neuromuscular disorder (HCC)    RA  . Peripheral vascular disease (HCC)    carotid occlusion surgery on left  . Raynaud's disease   . Retinal edema   . Rheumatoid arthritis(714.0)   . Stroke [facility](HCC)    two mild strokes presumed from left carotid stenosis    Past Surgical History:  Procedure Laterality Date  . CAROTID ARTERY ANGIOPLASTY  2008   Dr Madilyn FiremanHayes  . CAROTID ENDARTERECTOMY Left 11-15-2007  . [facility]  . CHOLECYSTECTOMY  1995  . CORNEAL TRANSPLANT     x 5 ; steroid inj. retnal information  . [facility]  . EYE SURGERY Right 12/2016   cornea repair   . EYE SURGERY Right 09/20/2017   cornea transplant   . GLAUCOMA SURGERY Right 2018  . TRIGGER FINGER RELEASE Right 05/16/2018   Procedure: RELEASE TRIGGER FINGER/A-1 PULLEY RIGHT THUMB;  Surgeon: Cindee SaltKuzma, Gary, MD;  Location: [facility];  Service: Orthopedics;  Laterality: Right;    There were no vitals filed for this visit.  Subjective Assessment - 09/19/18 0835    Subjective  "I am doing better today, only the R side is acting up. I did return to the gym and worked on the treadmill avoiding doing too much"    Patient Stated Goals  reduce the knots in the neck/ shoulder, have good positioning with laying down, reduce pain    Currently in Pain?  Yes    Pain Score  5    woke up with a migriane and took medication this AM   Pain Location  Neck    Pain Orientation  Left    Pain Type  Chronic pain    Pain Onset  More than a month ago    Pain Frequency  Intermittent    Aggravating Factors   getting up in the morning, lifting with the arms, reaching up,     Pain Relieving Factors  medication, moving around                       [facility]/Exercise - 09/19/18 0855      Neck Exercises: Machines for Strengthening  UBE (Upper Arm Bike)  L1 x 4 min    changing direction at 2 min     Neck Exercises: Seated   Cervical Isometrics  Flexion;Extension;Right rotation;Left rotation;Left lateral flexion;Right lateral flexion;5 secs;5 reps      Manual Therapy   Manual therapy comments  skilled palpation and monitoring of pt throughout TPDN    Joint Mobilization  rib mobs grade III with pt breathing in/ out deeply, T1-T6 PA grade III central mobs    Soft tissue mobilization  IASTM along R upper trap, levator scapulae and sub-occipitals      Neck Exercises: Stretches   Upper Trapezius Stretch  2 reps;30 seconds    Levator Stretch  2 reps;30 seconds    Corner Stretch  2 reps;30 seconds    Other Neck Stretches  SCM stretch 2 x 30 sec       Trigger Point Dry Needling - 09/19/18 0001    Consent Given?  Yes    Education Handout Provided  Previously provided    Upper Trapezius Response  Twitch reponse elicited;Palpable increased muscle length    SubOccipitals Response  Twitch response  elicited;Palpable increased muscle length    Levator Scapulae Response  Twitch response elicited           PT Education - 09/19/18 0920    Education Details  updated HEP for stretching and cervical isometrics    Person(s) Educated  Patient    Methods  Explanation;Verbal cues;Handout    Comprehension  Verbalized understanding;Verbal cues required       PT Short Term Goals - 09/19/18 0928      PT SHORT TERM GOAL #1   Title  pt to be I with initial HEP    Time  3    Period  Weeks    Status  Achieved        PT Long Term Goals - 08/29/18 1129      PT LONG TERM GOAL #1   Title  pt to demo proper posture and lifting mechancis to reduce and prevent neck/ shoulder pain     Time  6    Period  Weeks    Status  New    Target Date  10/10/18      PT LONG TERM GOAL #2   Title  report decreased spasm inthe upper traps and surrounding musculature to reduce pain to </= 2/10 and maintain cerivcal mobility    Time  6    Period  Weeks    Status  New    Target Date  10/10/18      PT LONG TERM GOAL #3   Title  pt to return to the gym and report decreased HA occurence by >/= 1 week for pt goals and promote QOL    Time  6    Period  Weeks    Status  New    Target Date  10/10/18      PT LONG TERM GOAL #4   Title  increase FOTO score to </= 36% limited to demo improvement in function    Time  6    Period  Weeks    Status  New    Target Date  10/10/18      PT LONG TERM GOAL #5   Title  pt to be I with all HEP given as of last visit to maintain and progress current level of function    Time  6    Period  Weeks  Status  New    Target Date  10/10/18            Plan - 09/19/18 0856    Clinical Impression Statement  pt arrived early and reported she is feeling increased tension on the R today and reported a migraine this morning. Continued TPDN focusing on the R upper trap, levator scapulae and R sub-occipitals followed with IASTM and cervicolthoracic mobs. worked on  Print production planner. End of session pt reported decreased muscle tension and denied HA.     PT Treatment/Interventions  ADLs/Self Care Home Management;Cryotherapy;Electrical Stimulation;Iontophoresis 4mg /ml Dexamethasone;Moist Heat;Traction;Ultrasound;Therapeutic exercise;Therapeutic activities;Dry needling;Taping;Patient/family education;Manual techniques;Neuromuscular re-education    PT Next Visit Plan  update HEP, response to DN for upper trap/ levator, add sub-occipitals. L thoracic rib mobs, peri-scapular strengthening    PT Home Exercise Plan  chin tucks progressing to chin tuck head lift, upper trap/ levator scapulae stretch, ceiling punches, proper sitting posture, anterior pelvic tilt, rib mobs with towel, SNAGS, posture handout, cervical isometrics, SCM stretch, pec stretch    Consulted and Agree with Plan of Care  Patient       Patient will benefit from skilled therapeutic intervention in order to improve the following deficits and impairments:  Pain, Increased fascial restricitons, Decreased strength, Decreased activity tolerance, Improper body mechanics, Postural dysfunction, Impaired UE functional use, Decreased endurance  Visit Diagnosis: Chronic nonintractable headache, unspecified headache type  Cervicalgia  Abnormal posture     Problem List Patient Active Problem List   Diagnosis Date Noted  . URI, acute 05/19/2018  . Cerumen impaction 05/19/2018  . Trigger thumb, right thumb 10/05/2017  . History of macular degeneration 09/13/2016  . History of migraine 09/06/2016  . History of seizures 09/06/2016  . Iritis of right eye 05/12/2016  . Rheumatoid arthritis with positive rheumatoid factor (HCC) 05/11/2016  . 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  . High risk medication use 05/11/2016  . Osteoporosis 05/11/2016  . 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 History of carotid endarterectomy 06/27/2014  . Migraine  without aura 04/19/2013  . Seizures (HCC) 04/19/2013  . 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 Cough 01/14/2011  . Pulmonary fibrosis (HCC) 01/14/2011    Lulu Riding PT, DPT, LAT, ATC  09/19/18  9:29 AM      [facility] [address] [place], [place], [address] Phone: [phone]   Fax:  [phone]  Name: Brianna [name] MRN: [id number] Date of Birth: 07-16-1951

## 2018-09-21 ENCOUNTER — Ambulatory Visit: Payer: Medicare Other | Admitting: Physical Therapy

## 2018-09-21 ENCOUNTER — Encounter: Payer: Self-pay | Admitting: Physical Therapy

## 2018-09-21 DIAGNOSIS — G8929 Other chronic pain: Secondary | ICD-10-CM

## 2018-09-21 DIAGNOSIS — R293 Abnormal posture: Secondary | ICD-10-CM

## 2018-09-21 DIAGNOSIS — R51 Headache: Principal | ICD-10-CM

## 2018-09-21 DIAGNOSIS — M542 Cervicalgia: Secondary | ICD-10-CM

## 2018-09-21 NOTE — Therapy (Signed)
[facility] [address] [place], [place], [address] Phone: [phone]   Fax:  [phone]  Physical Therapy Treatment  Patient Details  Name: Brianna [name] MRN: [id number] Date of Birth: October 04, 1950 Referring Provider (PT): Naomie DeanAntonia Ahern MD   Encounter Date: 09/21/2018  PT End of Session - 09/21/18 0845    Visit Number  6    Number of Visits  13    Date for PT Re-Evaluation  10/10/18    Authorization Type  MCR: kx mod by 15th visit, progress note at 10th visit    PT Start Time  0845    PT Stop Time  0926    PT Time Calculation (min)  41 min    Activity Tolerance  Patient tolerated treatment well    Behavior During Therapy  [facility] for tasks assessed/performed       Past Medical History:  Diagnosis Date  . Arthritis    in the neck  . Carotid artery occlusion   . Epilepsy (HCC)   . GERD (gastroesophageal reflux disease)   . Glaucoma   . Hyperlipidemia   . Hypertension   . Keratoconus of both eyes 1981  . Macular degeneration   . Migraines   . Neuromuscular disorder (HCC)    RA  . Peripheral vascular disease (HCC)    carotid occlusion surgery on left  . Raynaud's disease   . Retinal edema   . Rheumatoid arthritis(714.0)   . Stroke [facility](HCC)    two mild strokes presumed from left carotid stenosis    Past Surgical History:  Procedure Laterality Date  . CAROTID ARTERY ANGIOPLASTY  2008   Dr Madilyn FiremanHayes  . CAROTID ENDARTERECTOMY Left 11-15-2007  . cataract extraction Right 04-2014   [facility]  . CHOLECYSTECTOMY  1995  . CORNEAL TRANSPLANT     x 5 ; steroid inj. retnal information  . [facility]PL[facility] Right [facility]-22-[facility]  . EYE SURGERY Right 12/2016   cornea repair   . EYE SURGERY Right 09/20/2017   cornea transplant   . GLAUCOMA SURGERY Right 2018  . TRIGGER FINGER RELEASE Right 05/16/2018   Procedure: RELEASE TRIGGER FINGER/A-1 PULLEY RIGHT THUMB;  Surgeon: Cindee SaltKuzma, Gary, MD;  Location: [place]  [facility];  Service: Orthopedics;  Laterality: Right;    There were no vitals filed for this visit.  Subjective Assessment - 09/21/18 0847    Subjective  " I am doing pretty good today, I did have a mild HA this morning but it calmed down with the stretching"    Patient Stated Goals  reduce the knots in the neck/ shoulder, have good positioning with laying down, reduce pain    Currently in Pain?  No/denies    Pain Score  0-No pain    Pain Descriptors / Indicators  Aching;Sore    Pain Type  Chronic pain    Pain Onset  More than a month ago    Pain Frequency  Intermittent                       [facility] Adult PT Treatment/Exercise - 09/21/18 0848      Neck Exercises: Machines for Strengthening   UBE (Upper Arm Bike)  L1 x 6 min    changing direction @3  min      Neck Exercises: Standing   Other Standing Exercises  neck isometrics usingball against the wall 1 x 5 hodling 5 sec each bil SB, extension,  Shoulder Exercises: Supine   Other Supine Exercises  foam roll routine (on rolled towels), scapular protraction, horizontal abd/adduction, x to Y, alternating ceiling punch, and back stroke 1 x 10 ea.   multiple cues for proper form     Shoulder Exercises: Standing   Extension  Strengthening;Both;12 reps;Theraband    Theraband Level (Shoulder Extension)  Level 3 (Green)    Row  Strengthening;12 reps;Theraband    Theraband Level (Shoulder Row)  Level 3 (Green)    Other Standing Exercises  wall push-up 2 x 10   cues for proper form     Neck Exercises: Stretches   Upper Trapezius Stretch  1 rep;30 seconds;Left;Right    Levator Stretch  Right;Left;1 rep;30 seconds    Corner Stretch  1 rep;30 seconds    Other Neck Stretches  SCM stretch 1 x 30 sec             PT Education - 09/21/18 0925    Education Details  updated HEP for rows and shoulder extensions and provided a green theraband    Person(s) Educated  Patient    Methods  Explanation;Verbal  cues;Handout    Comprehension  Verbalized understanding;Verbal cues required       PT Short Term Goals - 09/19/18 0928      PT SHORT TERM GOAL #1   Title  pt to be I with initial HEP    Time  3    Period  Weeks    Status  Achieved        PT Long Term Goals - 08/29/18 1129      PT LONG TERM GOAL #1   Title  pt to demo proper posture and lifting mechancis to reduce and prevent neck/ shoulder pain     Time  6    Period  Weeks    Status  New    Target Date  10/10/18      PT LONG TERM GOAL #2   Title  report decreased spasm inthe upper traps and surrounding musculature to reduce pain to </= 2/10 and maintain cerivcal mobility    Time  6    Period  Weeks    Status  New    Target Date  10/10/18      PT LONG TERM GOAL #3   Title  pt to return to the gym and report decreased HA occurence by >/= 1 week for pt goals and promote QOL    Time  6    Period  Weeks    Status  New    Target Date  10/10/18      PT LONG TERM GOAL #4   Title  increase FOTO score to </= 36% limited to demo improvement in function    Time  6    Period  Weeks    Status  New    Target Date  10/10/18      PT LONG TERM GOAL #5   Title  pt to be I with all HEP given as of last visit to maintain and progress current level of function    Time  6    Period  Weeks    Status  New    Target Date  10/10/18            Plan - 09/21/18 1610    Clinical Impression Statement  pt states she is doing well today. focused session on posture/ core strengthening and shoulder strengthening. She did well with all exercises and reported  no increase pain or soreness.     PT Treatment/Interventions  ADLs/Self Care Home Management;Cryotherapy;Electrical Stimulation;Iontophoresis 4mg /ml Dexamethasone;Moist Heat;Traction;Ultrasound;Therapeutic exercise;Therapeutic activities;Dry needling;Taping;Patient/family education;Manual techniques;Neuromuscular re-education    PT Next Visit Plan  update HEP, response to DN for upper  trap/ levator, add sub-occipitals. L thoracic rib mobs, peri-scapular strengthening    PT Home Exercise Plan  chin tucks progressing to chin tuck head lift, upper trap/ levator scapulae stretch, ceiling punches, proper sitting posture, anterior pelvic tilt, rib mobs with towel, SNAGS, posture handout, cervical isometrics, SCM stretch, pec stretch Rows/ shoulder extension (with green band)       Patient will benefit from skilled therapeutic intervention in order to improve the following deficits and impairments:  Pain, Increased fascial restricitons, Decreased strength, Decreased activity tolerance, Improper body mechanics, Postural dysfunction, Impaired UE functional use, Decreased endurance  Visit Diagnosis: Chronic nonintractable headache, unspecified headache type  Cervicalgia  Abnormal posture     Problem List Patient Active Problem List   Diagnosis Date Noted  . URI, acute 05/19/2018  . Cerumen impaction 05/19/2018  . Trigger thumb, right thumb 10/05/2017  . History of macular degeneration 09/13/2016  . History of migraine 09/06/2016  . History of seizures 09/06/2016  . Iritis of right eye 05/12/2016  . Rheumatoid arthritis with positive rheumatoid factor (HCC) 05/11/2016  . 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  . High risk medication use 05/11/2016  . Osteoporosis 05/11/2016  . 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 History of carotid endarterectomy 06/27/2014  . Migraine without aura 04/19/2013  . Seizures (HCC) 04/19/2013  . 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 Cough 01/14/2011  . Pulmonary fibrosis (HCC) 01/14/2011   Lulu Riding PT, DPT, LAT, ATC  09/21/18  9:29 AM      [facility] [address] [place], [place], [address] Phone: [phone]   Fax:  [phone]  Name: Brianna [name] MRN: [id number] Date of Birth: September 22, 1950

## 2018-09-26 ENCOUNTER — Encounter: Payer: Self-pay | Admitting: Physical Therapy

## 2018-09-26 ENCOUNTER — Ambulatory Visit: Payer: Medicare Other | Admitting: Physical Therapy

## 2018-09-26 DIAGNOSIS — G8929 Other chronic pain: Secondary | ICD-10-CM

## 2018-09-26 DIAGNOSIS — R51 Headache: Principal | ICD-10-CM

## 2018-09-26 DIAGNOSIS — R519 Headache, unspecified: Secondary | ICD-10-CM

## 2018-09-26 DIAGNOSIS — M542 Cervicalgia: Secondary | ICD-10-CM

## 2018-09-26 DIAGNOSIS — R293 Abnormal posture: Secondary | ICD-10-CM

## 2018-09-26 NOTE — Therapy (Signed)
[facility] [address] [place], [place], [address] Phone: [phone]   Fax:  [phone]  Physical Therapy Treatment  Patient Details  Name: Brianna [name] A Nader MRN: [id number] Date of Birth: [birth year] Referring Provider (PT): Naomie DeanAntonia Ahern MD   Encounter Date: 09/26/2018  PT End of Session - 09/26/18 0845    Visit Number  7    Number of Visits  13    Date for PT Re-Evaluation  10/10/18    Authorization Type  MCR: kx mod by 15th visit, progress note at 10th visit    PT Start Time  0845    PT Stop Time  0930    PT Time Calculation (min)  45 min    Activity Tolerance  Patient tolerated treatment well    Behavior During Therapy  [facility] for tasks assessed/performed       Past Medical History:  Diagnosis Date  . Arthritis    in the neck  . Carotid artery occlusion   . Epilepsy (HCC)   . GERD (gastroesophageal reflux disease)   . Glaucoma   . Hyperlipidemia   . Hypertension   . Keratoconus of both eyes 1981  . Macular degeneration   . Migraines   . Neuromuscular disorder (HCC)    RA  . Peripheral vascular disease (HCC)    carotid occlusion surgery on left  . Raynaud's disease   . Retinal edema   . Rheumatoid arthritis(714.0)   . Stroke [facility](HCC)    two mild strokes presumed from left carotid stenosis    Past Surgical History:  Procedure Laterality Date  . CAROTID ARTERY ANGIOPLASTY  2008   Dr Madilyn FiremanHayes  . CAROTID ENDARTERECTOMY Left 11-15-2007  . [facility] Right 04[facility]  . CHOLECYSTECTOMY  1995  . CORNEAL TRANSPLANT     x 5 ; steroid inj. retnal information  . [facility]NEAL TR[facility]PLANT Right [facility]-22-2014   [facility]  . EYE SURGERY Right 12/2016   cornea repair   . EYE SURGERY Right 09/20/2017   cornea transplant   . GLAUCOMA SURGERY Right 2018  . TRIGGER FINGER RELEASE Right 05/16/2018   Procedure: RELEASE TRIGGER FINGER/A-1 PULLEY RIGHT THUMB;  Surgeon: Cindee SaltKuzma, Gary, MD;  Location: [place]  [facility];  Service: Orthopedics;  Laterality: Right;    There were no vitals filed for this visit.  Subjective Assessment - 09/26/18 0849    Subjective  "I did wake up this morning with a HA around 3am, which is alittle better than this morning"    Patient Stated Goals  reduce the knots in the neck/ shoulder, have good positioning with laying down, reduce pain    Currently in Pain?  Yes    Pain Score  5     Pain Location  Neck    Pain Orientation  Left    Pain Onset  More than a month ago    Pain Frequency  Intermittent    Aggravating Factors   getting up in the morning    Pain Relieving Factors  medication, stretching, exercise         [facility] PT Assessment - 09/26/18 0858      Observation/Other Assessments   Focus on Therapeutic Outcomes (FOTO)   50% limited      AROM   Cervical Flexion  80    Cervical Extension  62    Cervical - Right Side Bend  70    Cervical - Left Side Bend  60  Cervical - Right Rotation  75    Cervical - Left Rotation  70                   [facility] Adult PT Treatment/Exercise - 09/26/18 0915      Self-Care   Posture  sleeping posture and positiong to reduce tension on the neck in supine and L sidelying keeping head inline with shoulders      Neck Exercises: Machines for Strengthening   UBE (Upper Arm Bike)  L1 x 4 min (changing directoin at 2 min)   sprintgin 10 secs every 30 sec interval     Shoulder Exercises: ROM/Strengthening   UBE (Upper Arm Bike)  L1 x 4 min       Manual Therapy   Manual therapy comments  skilled palpation and monitoring of pt throughout TPDN    Joint Mobilization  rib mobs grade III with pt breathing in/ out deeply, T1-T6 PA grade III central mobs    Soft tissue mobilization  IASTM along R upper trap, levator scapulae and sub-occipitals      Neck Exercises: Stretches   Upper Trapezius Stretch  1 rep;Right;Left;30 seconds       Trigger Point Dry Needling - 09/26/18 0001    Consent Given?  Yes     Education Handout Provided  Previously provided    Upper Trapezius Response  Twitch reponse elicited;Palpable increased muscle length   bil            PT Short Term Goals - 09/19/18 0928      PT SHORT TERM GOAL #1   Title  pt to be I with initial HEP    Time  3    Period  Weeks    Status  Achieved        PT Long Term Goals - 08/29/18 1129      PT LONG TERM GOAL #1   Title  pt to demo proper posture and lifting mechancis to reduce and prevent neck/ shoulder pain     Time  6    Period  Weeks    Status  New    Target Date  10/10/18      PT LONG TERM GOAL #2   Title  report decreased spasm inthe upper traps and surrounding musculature to reduce pain to </= 2/10 and maintain cerivcal mobility    Time  6    Period  Weeks    Status  New    Target Date  10/10/18      PT LONG TERM GOAL #3   Title  pt to return to the gym and report decreased HA occurence by >/= 1 week for pt goals and promote QOL    Time  6    Period  Weeks    Status  New    Target Date  10/10/18      PT LONG TERM GOAL #4   Title  increase FOTO score to </= 36% limited to demo improvement in function    Time  6    Period  Weeks    Status  New    Target Date  10/10/18      PT LONG TERM GOAL #5   Title  pt to be I with all HEP given as of last visit to maintain and progress current level of function    Time  6    Period  Weeks    Status  New    Target Date  10/10/18  Plan - 09/26/18 0930    Clinical Impression Statement  spent time focusing on proper posture with sleeping and laying down. continued TPDN focusing on bil upper trap followed with cervicothoracic mobs. continued shoulder strengthening/ stretching. end of session she noted decreased tightness and no HA.     PT Next Visit Plan  ERO, response to DN for upper trap/ levator, add sub-occipitals. L thoracic rib mobs, peri-scapular strengthening    PT Home Exercise Plan  chin tucks progressing to chin tuck head lift, upper  trap/ levator scapulae stretch, ceiling punches, proper sitting posture, anterior pelvic tilt, rib mobs with towel, SNAGS, posture handout, cervical isometrics, SCM stretch, pec stretch Rows/ shoulder extension (with green band) sleeping  positioning       Patient will benefit from skilled therapeutic intervention in order to improve the following deficits and impairments:  Pain, Increased fascial restricitons, Decreased strength, Decreased activity tolerance, Improper body mechanics, Postural dysfunction, Impaired UE functional use, Decreased endurance  Visit Diagnosis: Chronic nonintractable headache, unspecified headache type  Abnormal posture  Cervicalgia     Problem List Patient Active Problem List   Diagnosis Date Noted  . URI, acute 05/19/2018  . Cerumen impaction 05/19/2018  . Trigger thumb, right thumb 10/05/2017  . History of macular degeneration 09/13/2016  . History of migraine 09/06/2016  . History of seizures 09/06/2016  . Iritis of right eye 05/12/2016  . Rheumatoid arthritis with positive rheumatoid factor (HCC) 05/11/2016  . 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  . High risk medication use 05/11/2016  . Osteoporosis 05/11/2016  . 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 History of carotid endarterectomy 06/27/2014  . Migraine without aura 04/19/2013  . Seizures (HCC) 04/19/2013  . 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 Cough 01/14/2011  . Pulmonary fibrosis (HCC) 01/14/2011   Lulu Riding PT, DPT, LAT, ATC  09/26/18  9:40 AM      [facility] [address] [place], [place], [address] Phone: [phone]   Fax:  [phone]  Name: BRELAND KILCREASE MRN: [id number] Date of Birth: [birth year]

## 2018-09-27 NOTE — Progress Notes (Deleted)
Office Visit Note  [name]             Date of Birth: 28-Jun-1951           MRN: [id number]             PCP: Pearson Grippe, MD Referring: Pearson Grippe, MD Visit Date: 10/11/2018 Occupation: @GUAROCC @  Subjective:  No chief complaint on file.   History of Present Illness: Brianna [name] is a 68 y.o. female ***   Activities of Daily Living:  Patient reports morning stiffness for *** {minute/hour:19697}.   Patient {ACTIONS;DENIES/REPORTS:21021675::"Denies"} nocturnal pain.  Difficulty dressing/grooming: {ACTIONS;DENIES/REPORTS:21021675::"Denies"} Difficulty climbing stairs: {ACTIONS;DENIES/REPORTS:21021675::"Denies"} Difficulty getting out of chair: {ACTIONS;DENIES/REPORTS:21021675::"Denies"} Difficulty using hands for taps, buttons, cutlery, and/or writing: {ACTIONS;DENIES/REPORTS:21021675::"Denies"}  No Rheumatology ROS completed.   PMFS History:  Patient Active Problem List   Diagnosis Date Noted  . URI, acute 05/19/2018  . Cerumen impaction 05/19/2018  . Trigger thumb, right thumb 10/05/2017  . History of macular degeneration 09/13/2016  . History of migraine 09/06/2016  . History of seizures 09/06/2016  . Iritis of right eye 05/12/2016  . Rheumatoid arthritis with positive rheumatoid factor (HCC) 05/11/2016  . 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  . High risk medication use 05/11/2016  . Osteoporosis 05/11/2016  . 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 History of carotid endarterectomy 06/27/2014  . Migraine without aura 04/19/2013  . Seizures (HCC) 04/19/2013  . 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 Cough 01/14/2011  . Pulmonary fibrosis (HCC) 01/14/2011    Past Medical History:  Diagnosis Date  . Arthritis    in the neck  . Carotid artery occlusion   . Epilepsy (HCC)   . GERD (gastroesophageal reflux disease)   . Glaucoma   . Hyperlipidemia   . Hypertension   . Keratoconus of both eyes 1981  .  Macular degeneration   . Migraines   . Neuromuscular disorder (HCC)    RA  . Peripheral vascular disease (HCC)    carotid occlusion surgery on left  . Raynaud's disease   . Retinal edema   . Rheumatoid arthritis(714.0)   . Stroke [facility])    two mild strokes presumed from left carotid stenosis    Family History  Problem Relation Age of Onset  . Heart disease Mother   . Lung disease Mother        ? disease process  . Uterine cancer Mother   . Heart disease Father   . Clotting disorder Father   . Collagen disease Father   . Cervical cancer Maternal Aunt   . Prostate cancer Maternal Grandfather   . Rheum arthritis Sister   . High Cholesterol Sister   . Epilepsy Sister   . Rheum arthritis Maternal Grandmother   . High Cholesterol Brother   . High blood pressure Brother   . High blood pressure Brother   . High Cholesterol Brother   . High blood pressure Brother   . High Cholesterol Brother    Past Surgical History:  Procedure Laterality Date  . CAROTID ARTERY ANGIOPLASTY  2008   Dr Madilyn Fireman  . CAROTID ENDARTERECTOMY Left 11-15-2007  . cataract extraction Right 04-2014   [facility]  . CHOLECYSTECTOMY  1995  . CORNEAL TRANSPLANT     x 5 ; steroid inj. retnal information  . CORNEAL TRANSPLANT Right 01-22-2014   [facility]  . EYE SURGERY Right 12/2016   cornea repair   . EYE SURGERY Right 09/20/2017   cornea  transplant   . GLAUCOMA SURGERY Right 2018  . TRIGGER FINGER RELEASE Right 05/16/2018   Procedure: RELEASE TRIGGER FINGER/A-1 PULLEY RIGHT THUMB;  Surgeon: Cindee Salt, MD;  Location: [facility];  Service: Orthopedics;  Laterality: Right;   Social History   Social History Narrative   Patient is married to [name]), has 2 children   Patient is right handed   Education level is Bachelor's degree   Caffeine consumption is 2 cups daily   Lives at home with husband    Immunization History  Administered Date(s) Administered  . Influenza Split  04/26/2013, 04/08/2017  . Influenza, High Dose Seasonal PF 04/09/2018  . Pneumococcal Conjugate-13 08/27/2013  . Pneumococcal Polysaccharide-23 01/14/2011  . Tdap 11/15/2017     Objective: Vital Signs: There were no vitals taken for this visit.   Physical Exam   Musculoskeletal Exam: ***  CDAI Exam: CDAI Score: Not documented Patient Global Assessment: Not documented; Provider Global Assessment: Not documented Swollen: Not documented; Tender: Not documented Joint Exam   Not documented   There is currently no information documented on the homunculus. Go to the Rheumatology activity and complete the homunculus joint exam.  Investigation: No additional findings.  Imaging: No results found.  Recent Labs: Lab Results  Component Value Date   WBC 7.0 05/31/2018   HGB 13.4 05/31/2018   PLT 250 05/31/2018   NA 142 05/31/2018   K 4.7 05/31/2018   CL 104 05/31/2018   CO2 33 (H) 05/31/2018   GLUCOSE 112 (H) 05/31/2018   BUN 15 05/31/2018   CREATININE 0.54 05/31/2018   BILITOT 0.4 05/31/2018   ALKPHOS 110 02/10/2017   AST 31 05/31/2018   ALT 22 05/31/2018   PROT 7.0 05/31/2018   ALBUMIN 4.4 02/10/2017   CALCIUM 9.3 05/31/2018   GFRAA 115 05/31/2018   QFTBGOLDPLUS NEGATIVE 05/31/2018    Speciality Comments: Last Reclast 11/30/2017   Procedures:  No procedures performed Allergies: Morphine; Morphine and related; Alphagan [brimonidine]; Codeine; Dorzolamide hcl-timolol mal; Hydrocodone-acetaminophen; Sulfa antibiotics; and Sulfamethoxazole   Assessment / Plan:     Visit Diagnoses: Rheumatoid arthritis involving multiple sites with positive rheumatoid factor (HCC)  High risk medication use -  Arava 20 mg daily, Cyclosporin 50 mg 2 tabs po bid (added last week per patient) and Humira 40 mg subcutaneous every other week.  ILD (interstitial lung disease) (HCC) - Dr. Sherene Sires  Iritis of right eye - Dr. Sherryll Burger  DDD (degenerative disc disease), lumbar  Age-related  osteoporosis without current pathological fracture - on Reclast IV,previously on Fosamax and Prolia. DEXA 02/07/2013 T-score of -3.3. DEXA on 10/08/2016 T score -3.5. reclast-11/30/17  History of macular degeneration  History of glaucoma  History of seizures  History of migraine  History of carotid endarterectomy   Orders: No orders of the defined types were placed in this encounter.  No orders of the defined types were placed in this encounter.   Face-to-face time spent with patient was *** minutes. Greater than 50% of time was spent in counseling and coordination of care.  Follow-Up Instructions: No follow-ups on file.   Gearldine Bienenstock, PA-C  Note - This record has been created using Dragon software.  Chart creation errors have been sought, but may not always  have been located. Such creation errors do not reflect on  the standard of medical care.

## 2018-09-28 ENCOUNTER — Encounter: Payer: Self-pay | Admitting: Physical Therapy

## 2018-09-28 ENCOUNTER — Ambulatory Visit: Payer: Medicare Other | Admitting: Physical Therapy

## 2018-09-28 DIAGNOSIS — R51 Headache: Secondary | ICD-10-CM | POA: Diagnosis not present

## 2018-09-28 DIAGNOSIS — G8929 Other chronic pain: Secondary | ICD-10-CM

## 2018-09-28 DIAGNOSIS — M542 Cervicalgia: Secondary | ICD-10-CM

## 2018-09-28 DIAGNOSIS — R293 Abnormal posture: Secondary | ICD-10-CM

## 2018-09-28 NOTE — Therapy (Signed)
[place], [place], [address] Phone: [phone]   Fax:  [phone]  Physical Therapy Treatment / Discharge Summary  Patient Details  Name: Brianna [name] MRN: [id number] Date of Birth: [birth year] Referring Provider (PT): Sarina Ill MD   Encounter Date: 09/28/2018  PT End of Session - 09/28/18 1159    Visit Number  8    Number of Visits  13    Date for PT Re-Evaluation  10/10/18    Authorization Type  MCR: kx mod by 15th visit, progress note at 10th visit    PT Start Time  0847    PT Stop Time  0920    PT Time Calculation (min)  33 min    Activity Tolerance  Patient tolerated treatment well    Behavior During Therapy  [facility] for tasks assessed/performed       Past Medical History:  Diagnosis Date  . Arthritis    in the neck  . Carotid artery occlusion   . Epilepsy ([place])   . GERD (gastroesophageal reflux disease)   . Glaucoma   . Hyperlipidemia   . Hypertension   . Keratoconus of both eyes 1981  . Macular degeneration   . Migraines   . Neuromuscular disorder (HCC)    RA  . Peripheral vascular disease ([place])    carotid occlusion surgery on left  . Raynaud's disease   . Retinal edema   . Rheumatoid arthritis(714.0)   . Stroke [facility])    two mild strokes presumed from left carotid stenosis    Past Surgical History:  Procedure Laterality Date  . CAROTID ARTERY ANGIOPLASTY  2008   Dr Amedeo Plenty  . CAROTID ENDARTERECTOMY Left 11-15-2007  . cataract extraction Right 04-2014   [place]  . CORNEAL TRANSPLANT     x 5 ; steroid inj. retnal information  . CORNEAL TRANSPLANT Right 01-22-2014   [facility]  . EYE SURGERY Right 12/2016   cornea repair   . EYE SURGERY Right 09/20/2017   cornea transplant   . GLAUCOMA SURGERY Right 2018  . TRIGGER FINGER RELEASE Right 05/16/2018   Procedure: RELEASE TRIGGER FINGER/A-1 PULLEY RIGHT THUMB;  Surgeon: Daryll Brod, MD;   Location: [place];  Service: Orthopedics;  Laterality: Right;    There were no vitals filed for this visit.                    [facility] Adult PT Treatment/Exercise - 09/28/18 0001      Shoulder Exercises: Standing   Extension  Strengthening;Both;12 reps;Theraband    Theraband Level (Shoulder Extension)  Level 4 (Blue)    Row  Strengthening;12 reps;Theraband    Theraband Level (Shoulder Row)  Level 4 (Blue)    Other Standing Exercises  wall push-up 2 x 10    Other Standing Exercises  lower trap Y's with yellow theraband      Shoulder Exercises: ROM/Strengthening   UBE (Upper Arm Bike)  UBE L3 x 6 (changing direction at 3 min)             PT Education - 09/28/18 1204    Education Details  reviewed previously provided HEP. how to progress strengthening with increasing resistance, reps/ sets.    Person(s) Educated  Patient    Methods  Explanation;Verbal cues;Handout    Comprehension  Verbalized understanding;Verbal cues required       PT Short Term Goals - 09/19/18 2585  PT SHORT TERM GOAL #1   Title  pt to be I with initial HEP    Time  3    Period  Weeks    Status  Achieved        PT Long Term Goals - 09/28/18 1203      PT LONG TERM GOAL #1   Title  pt to demo proper posture and lifting mechancis to reduce and prevent neck/ shoulder pain     Time  6    Period  Weeks    Status  Achieved      PT LONG TERM GOAL #2   Title  report decreased spasm inthe upper traps and surrounding musculature to reduce pain to </= 2/10 and maintain cerivcal mobility    Time  6    Period  Weeks    Status  Achieved      PT LONG TERM GOAL #3   Title  pt to return to the gym and report decreased HA occurence by >/= 1 week for pt goals and promote QOL    Time  6    Period  Weeks    Status  Partially Met      PT LONG TERM GOAL #4   Title  increase FOTO score to </= 36% limited to demo improvement in function    Time  6    Period  Weeks     Status  Partially Met      PT LONG TERM GOAL #5   Title  pt to be I with all HEP given as of last visit to maintain and progress current level of function    Time  6    Period  Weeks    Status  Achieved            Plan - 09/28/18 1200    Clinical Impression Statement  [name] has made great progress with physical therapy increased cervical mobility and reduced muscle tension. She noted significant improvement in HA frequency and severity. She met or partially met all goals today and is able to maintain and progress her current level of function and will be discharged from PT.     PT Treatment/Interventions  ADLs/Self Care Home Management;Cryotherapy;Electrical Stimulation;Iontophoresis '4mg'$ /ml Dexamethasone;Moist Heat;Traction;Ultrasound;Therapeutic exercise;Therapeutic activities;Dry needling;Taping;Patient/family education;Manual techniques;Neuromuscular re-education    PT Next Visit Plan  d/C    PT Home Exercise Plan  chin tucks progressing to chin tuck head lift, upper trap/ levator scapulae stretch, ceiling punches, proper sitting posture, anterior pelvic tilt, rib mobs with towel, SNAGS, posture handout, cervical isometrics, SCM stretch, pec stretch Rows/ shoulder extension (with green band) sleeping  positioning    Consulted and Agree with Plan of Care  Patient       Patient will benefit from skilled therapeutic intervention in order to improve the following deficits and impairments:  Pain, Increased fascial restricitons, Decreased strength, Decreased activity tolerance, Improper body mechanics, Postural dysfunction, Impaired UE functional use, Decreased endurance  Visit Diagnosis: Chronic nonintractable headache, unspecified headache type  Abnormal posture  Cervicalgia     Problem List Patient Active Problem List   Diagnosis Date Noted  . URI, acute 05/19/2018  . Cerumen impaction 05/19/2018  . Trigger thumb, right thumb 10/05/2017  . History of macular  degeneration 09/13/2016  . History of migraine 09/06/2016  . History of seizures 09/06/2016  . Iritis of right eye 05/12/2016  . Rheumatoid arthritis with positive rheumatoid factor ([place]) 05/11/2016  . 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  .  High risk medication use 05/11/2016  . Osteoporosis 05/11/2016  . 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 History of carotid endarterectomy 06/27/2014  . Migraine without aura 04/19/2013  . Seizures ([place]) 04/19/2013  . 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 Cough 01/14/2011  . Pulmonary fibrosis ([place]) 01/14/2011    [place] 09/28/2018, 12:06 PM  [facility] [address] [place], [place], [address] Phone: [phone]   Fax:  [phone]  Name: Brianna [name] MRN: [id number] Date of Birth: Mar 28, 1951        PHYSICAL THERAPY DISCHARGE SUMMARY  Visits from Start of Care: 8  Current functional level related to goals / functional outcomes: See goals,    Remaining deficits: Intermittent HA improving in frequency and severity. See assessment   Education / Equipment: HEP, theraband, posture, lifting mechanics  Plan: Patient agrees to discharge.  Patient goals were partially met. Patient is being discharged due to being pleased with the current functional level.  ?????     Charidy Cappelletti PT, DPT, LAT, ATC  09/28/18  12:07 PM

## 2018-10-04 ENCOUNTER — Other Ambulatory Visit: Payer: Self-pay | Admitting: Family Medicine

## 2018-10-04 DIAGNOSIS — R1032 Left lower quadrant pain: Secondary | ICD-10-CM

## 2018-10-11 ENCOUNTER — Ambulatory Visit: Payer: Medicare Other | Admitting: Rheumatology

## 2018-10-26 ENCOUNTER — Other Ambulatory Visit: Payer: Medicare Other

## 2018-11-17 ENCOUNTER — Ambulatory Visit: Payer: Medicare Other | Admitting: Neurology

## 2018-11-20 ENCOUNTER — Ambulatory Visit: Payer: Medicare Other | Admitting: Rheumatology

## 2018-11-22 ENCOUNTER — Telehealth: Payer: Self-pay | Admitting: Neurology

## 2018-11-22 NOTE — Telephone Encounter (Signed)
I called and spoke with the patient regarding changing her apt due to COVID-19. I confirmed that she has not shown any new symptoms, been exposed to the virus nor been running a fever. I also informed her she would need to wear a mask and gloves.  °

## 2018-11-30 NOTE — Progress Notes (Signed)
Office Visit Note  Patient: Brianna Neeramela A Orbach             Date of Birth: 02-Sep-1950           MRN: [id number]             PCP: Pearson GrippeKim, James, MD Referring: Pearson GrippeKim, James, MD Visit Date: 12/13/2018 Occupation: @GUAROCC @  Subjective:  Medication monitoring.   History of Present Illness: Brianna [name] is a 68 y.o. female she states she had recent eye infection in left eye.  She states she is currently on topical antibiotics and oral antibiotics by ophthalmologist.  She was advised not to stop her immunosuppressive agents.  She has appointment coming up with Dr. Sherryll BurgerShah next week.  Denies any joint pain or joint swelling.  She denies any increased shortness of breath.  Activities of Daily Living:  Patient reports morning stiffness for 10 minutes.   Patient Denies nocturnal pain.  Difficulty dressing/grooming: Denies Difficulty climbing stairs: Denies Difficulty getting out of chair: Denies Difficulty using hands for taps, buttons, cutlery, and/or writing: Denies  Review of Systems  Constitutional: Negative for fatigue, night sweats, weight gain and weight loss.  HENT: Negative for mouth sores, trouble swallowing, trouble swallowing, mouth dryness and nose dryness.   Eyes: Positive for redness and dryness. Negative for pain and visual disturbance.  Respiratory: Negative for cough, shortness of breath and difficulty breathing.   Cardiovascular: Negative for chest pain, palpitations, hypertension, irregular heartbeat and swelling in legs/feet.  Gastrointestinal: Positive for constipation. Negative for blood in stool and diarrhea.  Endocrine: Negative for increased urination.  Genitourinary: Negative for vaginal dryness.  Musculoskeletal: Negative for arthralgias, joint pain, joint swelling, myalgias, muscle weakness, morning stiffness, muscle tenderness and myalgias.  Skin: Negative for color change, rash, hair loss, skin tightness, ulcers and sensitivity to sunlight.  Allergic/Immunologic:  Negative for susceptible to infections.  Neurological: Negative for dizziness, memory loss, night sweats and weakness.  Hematological: Negative for swollen glands.  Psychiatric/Behavioral: Negative for depressed mood and sleep disturbance. The patient is nervous/anxious.     PMFS History:  Patient Active Problem List   Diagnosis Date Noted  . URI, acute 05/19/2018  . Cerumen impaction 05/19/2018  . Trigger thumb, right thumb 10/05/2017  . History of macular degeneration 09/13/2016  . History of migraine 09/06/2016  . History of seizures 09/06/2016  . Iritis of right eye 05/12/2016  . Rheumatoid arthritis with positive rheumatoid factor (HCC) 05/11/2016  . 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  . High risk medication use 05/11/2016  . Osteoporosis 05/11/2016  . 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 History of carotid endarterectomy 06/27/2014  . Migraine without aura 04/19/2013  . Seizures (HCC) 04/19/2013  . 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 Cough 01/14/2011  . Pulmonary fibrosis (HCC) 01/14/2011    Past Medical History:  Diagnosis Date  . Arthritis    in the neck  . Carotid artery occlusion   . Epilepsy (HCC)   . GERD (gastroesophageal reflux disease)   . Glaucoma   . Hyperlipidemia   . Hypertension   . Keratoconus of both eyes 1981  . Macular degeneration   . Migraines   . Neuromuscular disorder (HCC)    RA  . Peripheral vascular disease (HCC)    carotid occlusion surgery on left  . Raynaud's disease   . Retinal edema   . Rheumatoid arthritis(714.0)   . Stroke [facility](HCC)    two mild strokes presumed from left carotid stenosis  Family History  Problem Relation Age of Onset  . Heart disease Mother   . Lung disease Mother        ? disease process  . Uterine cancer Mother   . Heart disease Father   . Clotting disorder Father   . Collagen disease Father   . Cervical cancer Maternal Aunt   . Prostate cancer Maternal  Grandfather   . Rheum arthritis Sister   . High Cholesterol Sister   . Epilepsy Sister   . Rheum arthritis Maternal Grandmother   . High Cholesterol Brother   . High blood pressure Brother   . High blood pressure Brother   . High Cholesterol Brother   . High blood pressure Brother   . High Cholesterol Brother    Past Surgical History:  Procedure Laterality Date  . CAROTID ARTERY ANGIOPLASTY  2008   Dr Madilyn Fireman  . CAROTID ENDARTERECTOMY Left 11-15-2007  . cataract extraction Right 04-2014   [facility]  . CHOLECYSTECTOMY  1995  . CORNEAL TRANSPLANT     x 5 ; steroid inj. retnal information  . CORNEAL TRANSPLANT Right 01-22-2014   [facility]  . EYE SURGERY Right 12/2016   cornea repair   . EYE SURGERY Right 09/20/2017   cornea transplant   . GLAUCOMA SURGERY Right 2018  . TRIGGER FINGER RELEASE Right 05/16/2018   Procedure: RELEASE TRIGGER FINGER/A-1 PULLEY RIGHT THUMB;  Surgeon: Cindee Salt, MD;  Location: [facility];  Service: Orthopedics;  Laterality: Right;   Social History   Social History Narrative   Patient is married to [name]), has 2 children   Patient is right handed   Education level is Bachelor's degree   Caffeine consumption is 2 cups daily   Lives at home with husband    Immunization History  Administered Date(s) Administered  . Influenza Split 04/26/2013, 04/08/2017  . Influenza, High Dose Seasonal PF 04/09/2018  . Pneumococcal Conjugate-13 08/27/2013  . Pneumococcal Polysaccharide-23 01/14/2011  . Tdap 11/15/2017     Objective: Vital Signs: BP (!) 167/91 (BP Location: Left Arm, Patient Position: Sitting, Cuff Size: Small)   Pulse 79   Resp 12   Ht 5\' 2"  (1.575 m)   Wt 126 lb 6.4 oz (57.3 kg)   BMI 23.12 kg/m    Physical Exam Vitals signs and nursing note reviewed.  Constitutional:      Appearance: She is well-developed.  HENT:     Head: Normocephalic and atraumatic.  Eyes:     Comments: Left conjunctival injection  Neck:      Musculoskeletal: Normal range of motion.  Cardiovascular:     Rate and Rhythm: Normal rate and regular rhythm.     Heart sounds: Normal heart sounds.  Pulmonary:     Effort: Pulmonary effort is normal.     Breath sounds: Normal breath sounds.  Abdominal:     General: Bowel sounds are normal.     Palpations: Abdomen is soft.  Lymphadenopathy:     Cervical: No cervical adenopathy.  Skin:    General: Skin is warm and dry.     Capillary Refill: Capillary refill takes less than 2 seconds.  Neurological:     Mental Status: She is alert and oriented to person, place, and time.  Psychiatric:        Behavior: Behavior normal.      Musculoskeletal Exam: C-spine in good range of motion.  Lumbar spine limited range of motion with discomfort.  Shoulder joints elbow joints wrist joints  with good range of motion.  She is some synovial thickening over MCPs PIPs but no synovitis was noted.  Hip joints knee joints ankles MTPs PIPs were in good range of motion motion without any synovitis.  CDAI Exam: CDAI Score: 0.2  Patient Global Assessment: 1 (mm); Provider Global Assessment: 1 (mm) Swollen: 0 ; Tender: 0  Joint Exam   Not documented   There is currently no information documented on the homunculus. Go to the Rheumatology activity and complete the homunculus joint exam.  Investigation: No additional findings.  Imaging: No results found.  Recent Labs: Lab Results  Component Value Date   WBC 7.0 05/31/2018   HGB 13.4 05/31/2018   PLT 250 05/31/2018   NA 142 05/31/2018   K 4.7 05/31/2018   CL 104 05/31/2018   CO2 33 (H) 05/31/2018   GLUCOSE 112 (H) 05/31/2018   BUN 15 05/31/2018   CREATININE 0.54 05/31/2018   BILITOT 0.4 05/31/2018   ALKPHOS 110 02/10/2017   AST 31 05/31/2018   ALT 22 05/31/2018   PROT 7.0 05/31/2018   ALBUMIN 4.4 02/10/2017   CALCIUM 9.3 05/31/2018   GFRAA 115 05/31/2018   QFTBGOLDPLUS NEGATIVE 05/31/2018    Speciality Comments: Last Reclast  11/30/2017   Procedures:  No procedures performed Allergies: Morphine; Morphine and related; Alphagan [brimonidine]; Codeine; Dorzolamide hcl-timolol mal; Hydrocodone-acetaminophen; Sulfa antibiotics; and Sulfamethoxazole   Assessment / Plan:     Visit Diagnoses: Rheumatoid arthritis involving multiple sites with positive rheumatoid factor (HCC)-patient has no synovitis on examination.  She is doing quite well as regards to arthritis on current combination of medications.  High risk medication use - She is on Arava 20 mg daily, Cyclosporin 50 mg 2 tabs twice daily, and Humira 40 mg subcutaneous every 14 days.  Most recent TB gold negative on 05/31/2018 and will monitor yearly.  Most recent CBC/CMP from [facility] within normal limits on 07/25/2018.  Due for CBC/CMP today and will monitor every 3 months.  Standing orders placed.  She received her flu vaccine in September and previously Prevnar 13 and Pneumovax 23 vaccines.  Recommend Shingrix vaccine as indicated.  Recommend yearly skin exams.  - Plan: CBC with Differential/Platelet, COMPLETE METABOLIC PANEL WITH GFR  ILD (interstitial lung disease) (HCC) -followed by Dr. Sherene Sires  Iritis of right eye -she has appointment coming up with Dr. Sherryll Burger next week.  She has an infection in her left eye currently.  She is to using topical antibiotics and oral antibiotics.  DDD (degenerative disc disease), lumbar-she has chronic mild discomfort in her lower back.  Age-related osteoporosis without current pathological fracture-her last bone density was March 2018.  Her last Reclast infusion was Nov 30, 2017.  Will schedule DEXA scan and then determine next Reclast infusion.  Other medical problems are listed as follows:  History of macular degeneration  History of glaucoma  History of seizures  History of migraine  History of carotid endarterectomy   Orders: Orders Placed This Encounter  Procedures  . DG BONE DENSITY (DXA)  . CBC with  Differential/Platelet  . COMPLETE METABOLIC PANEL WITH GFR   No orders of the defined types were placed in this encounter.   Face-to-face time spent with patient was 25 minutes. Greater than 50% of time was spent in counseling and coordination of care.  Follow-Up Instructions: Return in about 5 months (around 05/15/2019) for Rheumatoid arthritis.   Pollyann Savoy, MD  Note - This record has been created using Animal nutritionist.  Chart  creation errors have been sought, but may not always  have been located. Such creation errors do not reflect on  the standard of medical care.

## 2018-12-05 ENCOUNTER — Telehealth: Payer: Self-pay | Admitting: Neurology

## 2018-12-05 DIAGNOSIS — H16002 Unspecified corneal ulcer, left eye: Secondary | ICD-10-CM | POA: Insufficient documentation

## 2018-12-05 DIAGNOSIS — H5712 Ocular pain, left eye: Secondary | ICD-10-CM | POA: Insufficient documentation

## 2018-12-05 DIAGNOSIS — H44002 Unspecified purulent endophthalmitis, left eye: Secondary | ICD-10-CM | POA: Insufficient documentation

## 2018-12-05 NOTE — Telephone Encounter (Signed)
Spoke with Dr. Lucia Gaskins. We can offer a Depacon infusion. I spoke with nurse Leanne in infusion and confirmed they have availability this afternoon for infusion. I spoke with [name] (on DPR) and offered Depacon infusion this afternoon. He stated pt is waiting now at eye doctor and then after they speak to him they will call us back. He stated pt has not had any allergy changes.   Orders have been written for Depacon 1 gram IV x 1, pending pt decision & MD signature.

## 2018-12-05 NOTE — Telephone Encounter (Signed)
Pt's husband called stating that the pt is "having excruciating pain due to the migraines she is having" and they would like to know if the pt can come in for a drip. The pt has an appt today at 10am with the "eye doctor to have a procedure done to relieve some of the pain." Please advise.

## 2018-12-06 ENCOUNTER — Other Ambulatory Visit: Payer: Medicare Other

## 2018-12-12 ENCOUNTER — Ambulatory Visit: Payer: Medicare Other | Admitting: Neurology

## 2018-12-13 ENCOUNTER — Encounter: Payer: Self-pay | Admitting: Rheumatology

## 2018-12-13 ENCOUNTER — Other Ambulatory Visit: Payer: Self-pay

## 2018-12-13 ENCOUNTER — Ambulatory Visit: Payer: Medicare Other | Admitting: Rheumatology

## 2018-12-13 VITALS — BP 167/91 | HR 79 | Resp 12 | Ht 62.0 in | Wt 126.4 lb

## 2018-12-13 DIAGNOSIS — J849 Interstitial pulmonary disease, unspecified: Secondary | ICD-10-CM | POA: Diagnosis not present

## 2018-12-13 DIAGNOSIS — M0579 Rheumatoid arthritis with rheumatoid factor of multiple sites without organ or systems involvement: Secondary | ICD-10-CM

## 2018-12-13 DIAGNOSIS — Z79899 Other long term (current) drug therapy: Secondary | ICD-10-CM

## 2018-12-13 DIAGNOSIS — M51369 Other intervertebral disc degeneration, lumbar region without mention of lumbar back pain or lower extremity pain: Secondary | ICD-10-CM

## 2018-12-13 DIAGNOSIS — Z87898 Personal history of other specified conditions: Secondary | ICD-10-CM

## 2018-12-13 DIAGNOSIS — M810 Age-related osteoporosis without current pathological fracture: Secondary | ICD-10-CM

## 2018-12-13 DIAGNOSIS — J8410 Pulmonary fibrosis, unspecified: Secondary | ICD-10-CM

## 2018-12-13 DIAGNOSIS — H209 Unspecified iridocyclitis: Secondary | ICD-10-CM | POA: Diagnosis not present

## 2018-12-13 DIAGNOSIS — Z9889 Other specified postprocedural states: Secondary | ICD-10-CM

## 2018-12-13 DIAGNOSIS — M5136 Other intervertebral disc degeneration, lumbar region: Secondary | ICD-10-CM

## 2018-12-13 DIAGNOSIS — Z8669 Personal history of other diseases of the nervous system and sense organs: Secondary | ICD-10-CM

## 2018-12-13 NOTE — Patient Instructions (Signed)
Standing Labs We placed an order today for your standing lab work.    Please come back and get your standing labs in August and every 3 months  We have open lab daily Monday through Thursday from 8:30-12:30 PM and 1:30-4:30 PM and Friday from 8:30-12:30 PM and 1:30 -4:00 PM at the office of Dr. Arwyn Besaw.   You may experience shorter wait times on Monday and Friday afternoons. The office is located at [address], [address], [place], [place] [address] No appointment is necessary.   Labs are drawn by [facility].  You may receive a bill from [facility] for your lab work.  If you wish to have your labs drawn at another location, please call the office 24 hours in advance to send orders.  If you have any questions regarding directions or hours of operation,  please call [phone].   Just as a reminder please drink plenty of water prior to coming for your lab work. Thanks!   

## 2018-12-14 LAB — CBC WITH DIFFERENTIAL/PLATELET
Absolute Monocytes: 932 cells/uL (ref 200–950)
Basophils Absolute: 63 cells/uL (ref 0–200)
Basophils Relative: 0.8 %
Eosinophils Absolute: 521 cells/uL — ABNORMAL HIGH (ref 15–500)
Eosinophils Relative: 6.6 %
HCT: 43.3 % (ref 35.0–45.0)
Hemoglobin: 14.1 g/dL (ref 11.7–15.5)
Lymphs Abs: 1643 cells/uL (ref 850–3900)
MCH: 29.5 pg (ref 27.0–33.0)
MCHC: 32.6 g/dL (ref 32.0–36.0)
MCV: 90.6 fL (ref 80.0–100.0)
MPV: 11.1 fL (ref 7.5–12.5)
Monocytes Relative: 11.8 %
Neutro Abs: 4740 cells/uL (ref 1500–7800)
Neutrophils Relative %: 60 %
Platelets: 294 10*3/uL (ref 140–400)
RBC: 4.78 10*6/uL (ref 3.80–5.10)
RDW: 12.3 % (ref 11.0–15.0)
Total Lymphocyte: 20.8 %
WBC: 7.9 10*3/uL (ref 3.8–10.8)

## 2018-12-14 LAB — COMPLETE METABOLIC PANEL WITH GFR
AG Ratio: 1.2 (calc) (ref 1.0–2.5)
ALT: 19 U/L (ref 6–29)
AST: 29 U/L (ref 10–35)
Albumin: 4 g/dL (ref 3.6–5.1)
Alkaline phosphatase (APISO): 116 U/L (ref 37–153)
BUN: 13 mg/dL (ref 7–25)
CO2: 29 mmol/L (ref 20–32)
Calcium: 9.3 mg/dL (ref 8.6–10.4)
Chloride: 102 mmol/L (ref 98–110)
Creat: 0.6 mg/dL (ref 0.50–0.99)
GFR, Est African American: 110 mL/min/{1.73_m2} (ref >=60)
GFR, Est Non African American: 95 mL/min/{1.73_m2} (ref >=60)
Globulin: 3.3 g/dL (calc) (ref 1.9–3.7)
Glucose, Bld: 130 mg/dL — ABNORMAL HIGH (ref 65–99)
Potassium: 4.5 mmol/L (ref 3.5–5.3)
Sodium: 140 mmol/L (ref 135–146)
Total Bilirubin: 0.4 mg/dL (ref 0.2–1.2)
Total Protein: 7.3 g/dL (ref 6.1–8.1)

## 2018-12-19 ENCOUNTER — Other Ambulatory Visit: Payer: Medicare Other

## 2018-12-28 ENCOUNTER — Other Ambulatory Visit: Payer: Self-pay | Admitting: Internal Medicine

## 2018-12-28 DIAGNOSIS — R05 Cough: Secondary | ICD-10-CM

## 2018-12-28 DIAGNOSIS — R059 Cough, unspecified: Secondary | ICD-10-CM

## 2018-12-28 MED ORDER — PANTOPRAZOLE SODIUM 40 MG PO TBEC: DELAYED_RELEASE_TABLET | ORAL | 1 refills | Status: DC

## 2019-01-02 ENCOUNTER — Telehealth: Payer: Self-pay | Admitting: *Deleted

## 2019-01-02 ENCOUNTER — Ambulatory Visit: Payer: Medicare Other | Admitting: Neurology

## 2019-01-02 NOTE — Telephone Encounter (Signed)
Pt no showed botox appt today. 

## 2019-01-09 ENCOUNTER — Telehealth: Payer: Self-pay | Admitting: Neurology

## 2019-01-09 NOTE — Telephone Encounter (Signed)
I called to schedule the patient for her injection but she requested to call me back when she had her calendar.

## 2019-01-24 ENCOUNTER — Ambulatory Visit: Payer: Medicare Other | Admitting: Neurology

## 2019-01-24 ENCOUNTER — Other Ambulatory Visit: Payer: Self-pay

## 2019-01-24 DIAGNOSIS — G43719 Chronic migraine without aura, intractable, without status migrainosus: Secondary | ICD-10-CM | POA: Diagnosis not present

## 2019-01-24 DIAGNOSIS — M7918 Myalgia, other site: Secondary | ICD-10-CM

## 2019-01-24 MED ORDER — NURTEC 75 MG PO TBDP: 75.0000 mg | ORAL_TABLET | Freq: Once | ORAL | 5 refills | Status: DC | PRN

## 2019-01-24 NOTE — Patient Instructions (Signed)
Rimegepant: Patient drug information Access Lex[employer] Online here. Copyright 1978-2020 [employer] All rights reserved. (For additional information see "Rimegepant: Drug information") Brand Names: [place]  Nurtec  What is this drug used for?   It is used to treat migraine headaches.  What do I need to tell my doctor BEFORE I take this drug?   If you are allergic to this drug; any part of this drug; or any other drugs, foods, or substances. Tell your doctor about the allergy and what signs you had.   If you have any of these health problems: Kidney disease or liver disease.   If you take any drugs (prescription or OTC, natural products, vitamins) that must not be taken with this drug, like certain drugs that are used for HIV, infections, or seizures. There are many drugs that must not be taken with this drug.   This is not a list of all drugs or health problems that interact with this drug.   Tell your doctor and pharmacist about all of your drugs (prescription or OTC, natural products, vitamins) and health problems. You must check to make sure that it is safe for you to take this drug with all of your drugs and health problems. Do not start, stop, or change the dose of any drug without checking with your doctor.  What are some things I need to know or do while I take this drug?   Tell all of your health care providers that you take this drug. This includes your doctors, nurses, pharmacists, and dentists.   This drug is not meant to prevent or lower the number of migraine headaches you get.   Tell your doctor if you are pregnant, plan on getting pregnant, or are breast-feeding. You will need to talk about the benefits and risks to you and the baby.  What are some side effects that I need to call my doctor about right away?   WARNING/CAUTION: Even though it may be rare, some people may have very bad and sometimes deadly side effects when taking a drug. Tell your doctor or get medical help right  away if you have any of the following signs or symptoms that may be related to a very bad side effect:   Signs of an allergic reaction, like rash; hives; itching; red, swollen, blistered, or peeling skin with or without fever; wheezing; tightness in the chest or throat; trouble breathing, swallowing, or talking; unusual hoarseness; or swelling of the mouth, face, lips, tongue, or throat.  What are some other side effects of this drug?   All drugs may cause side effects. However, many people have no side effects or only have minor side effects. Call your doctor or get medical help if any of these side effects or any other side effects bother you or do not go away:   Upset stomach.   These are not all of the side effects that may occur. If you have questions about side effects, call your doctor. Call your doctor for medical advice about side effects.   You may report side effects to your national health agency.  How is this drug best taken?   Use this drug as ordered by your doctor. Read all information given to you. Follow all instructions closely.   Do not push the tablet out of the foil when opening. Use dry hands to take it from the foil. Place on your tongue and let it dissolve. Water is not needed. Do not swallow it whole. Do   not chew, break, or crush it.   If needed, you may place the tablet under the tongue.   Use right after opening.  What do I do if I miss a dose?   This drug is taken on an as needed basis. Do not take more often than told by the doctor.  How do I store and/or throw out this drug?   Store at room temperature in a dry place. Do not store in a bathroom.   Store in foil pouch until ready for use.   Keep all drugs in a safe place. Keep all drugs out of the reach of children and pets.   Throw away unused or expired drugs. Do not flush down a toilet or pour down a drain unless you are told to do so. Check with your pharmacist if you have questions about the best way to throw  out drugs. There may be drug take-back programs in your area.  General drug facts   If your symptoms or health problems do not get better or if they become worse, call your doctor.   Do not share your drugs with others and do not take anyone else's drugs.   Some drugs may have another patient information leaflet. If you have any questions about this drug, please talk with your doctor, nurse, pharmacist, or other health care provider.   If you think there has been an overdose, call your poison control center or get medical care right away. Be ready to tell or show what was taken, how much, and when it happened.   

## 2019-01-24 NOTE — Progress Notes (Signed)
Interval history 01/28/2019: She has 2 migraine days a week but this is because we are late on botox, with botox she is much better, baseline 12-15 migraine days a month and daily headaches. + masseters. inject 3 ajovy q38months may need booster along the way she will email with updates gave samples can provide again if needed.Dry needling: cervical myofascial pain, Send back to dry needling - [address] for cervical myofascial pain syndrome. Gave samples for Nurtec for her to try as well acutely.   Tried: VPA, Topamax, antidepressants, norvasc, phenergan, frova, aimovig, ajovy, fioricet,   Orders Placed This Encounter  Procedures  . Ambulatory referral to Physical Therapy   Meds ordered this encounter  Medications  . Rimegepant Sulfate (NURTEC) 75 MG TBDP    Sig: Take 75 mg by mouth once as needed for up to 1 dose. Take once daily as needed for migraine.    Dispense:  2 tablet    Refill:  5        Consent Form Botulism Toxin Injection For Chronic Migraine    Reviewed orally with patient, additionally signature is on file:  Botulism toxin has been approved by the Federal drug administration for treatment of chronic migraine. Botulism toxin does not cure chronic migraine and it may not be effective in some patients.  The administration of botulism toxin is accomplished by injecting a small amount of toxin into the muscles of the neck and head. Dosage must be titrated for each individual. Any benefits resulting from botulism toxin tend to wear off after 3 months with a repeat injection required if benefit is to be maintained. Injections are usually done every 3-4 months with maximum effect peak achieved by about 2 or 3 weeks. Botulism toxin is expensive and you should be sure of what costs you will incur resulting from the injection.  The side effects of botulism toxin use for chronic migraine may include:   -Transient, and usually mild, facial weakness with facial injections  -Transient, and usually mild, head or neck weakness with head/neck injections  -Reduction or loss of forehead facial animation due to forehead muscle weakness  -Eyelid drooping  -Dry eye  -Pain at the site of injection or bruising at the site of injection  -Double vision  -Potential unknown long term risks  Contraindications: You should not have Botox if you are pregnant, nursing, allergic to albumin, have an infection, skin condition, or muscle weakness at the site of the injection, or have myasthenia gravis, Lambert-Eaton syndrome, or ALS.  It is also possible that as with any injection, there may be an allergic reaction or no effect from the medication. Reduced effectiveness after repeated injections is sometimes seen and rarely infection at the injection site may occur. All care will be taken to prevent these side effects. If therapy is given over a long time, atrophy and wasting in the muscle injected may occur. Occasionally the patient's become refractory to treatment because they develop antibodies to the toxin. In this event, therapy needs to be modified.  I have read the above information and consent to the administration of botulism toxin.    BOTOX PROCEDURE NOTE FOR MIGRAINE HEADACHE    Contraindications and precautions discussed with patient(above). Aseptic procedure was observed and patient tolerated procedure. Procedure performed by Dr. Georgia Dom  The condition has existed for more than 6 months, and pt does not have a diagnosis of ALS, Myasthenia Gravis or Lambert-Eaton Syndrome.  Risks and benefits of injections discussed and pt agrees  to proceed with the procedure.  Written consent obtained  These injections are medically necessary. Pt  receives good benefits from these injections. These injections do not cause sedations or hallucinations which the oral therapies may cause.  Indication/Diagnosis: chronic migraine BOTOX(J0585) injection was performed according to protocol  by Allergan. 200 units of BOTOX was dissolved into 4 cc NS.   NDC: [address]-18   Description of procedure:  The patient was placed in a sitting position. The standard protocol was used for Botox as follows, with 5 units of Botox injected at each site:   -Procerus muscle, midline injection  -Corrugator muscle, bilateral injection  -Frontalis muscle, bilateral injection, with 2 sites each side, medial injection was performed in the upper one third of the frontalis muscle, in the region vertical from the medial inferior edge of the superior orbital rim. The lateral injection was again in the upper one third of the forehead vertically above the lateral limbus of the cornea, 1.5 cm lateral to the medial injection site.  -Temporalis muscle injection, 4 sites, bilaterally. The first injection was 3 cm above the tragus of the ear, second injection site was 1.5 cm to 3 cm up from the first injection site in line with the tragus of the ear. The third injection site was 1.5-3 cm forward between the first 2 injection sites. The fourth injection site was 1.5 cm posterior to the second injection site.  -Occipitalis muscle injection, 3 sites, bilaterally. The first injection was done one half way between the occipital protuberance and the tip of the mastoid process behind the ear. The second injection site was done lateral and superior to the first, 1 fingerbreadth from the first injection. The third injection site was 1 fingerbreadth superiorly and medially from the first injection site.  -Cervical paraspinal muscle injection, 2 sites, bilateral knee first injection site was 1 cm from the midline of the cervical spine, 3 cm inferior to the lower border of the occipital protuberance. The second injection site was 1.5 cm superiorly and laterally to the first injection site.  -Trapezius muscle injection was performed at 3 sites, bilaterally. The first injection site was in the upper trapezius muscle halfway  between the inflection point of the neck, and the acromion. The second injection site was one half way between the acromion and the first injection site. The third injection was done between the first injection site and the inflection point of the neck.   Will return for repeat injection in 3 months.   A 200 unit sof Botox was used, 155 units were injected, the rest of the Botox was wasted. The patient tolerated the procedure well, there were no complications of the above procedure.

## 2019-01-24 NOTE — Progress Notes (Signed)
Botox- 100 units x 2 vials Lot: [id number] Expiration: 06/2021 NDC: 2119-4174-08 & Lot: [id number] Expiration: 05/2021 NDC: 6314-9702-63  Bacteriostatic 0.9% Sodium Chloride- 70mL total Lot: [id number] Expiration: 04/19/2019 NDC: 0277-4128-78  Dx: M76.720 S/P

## 2019-02-26 ENCOUNTER — Other Ambulatory Visit: Payer: Self-pay

## 2019-02-26 ENCOUNTER — Encounter: Payer: Self-pay | Admitting: Physical Therapy

## 2019-02-26 ENCOUNTER — Ambulatory Visit: Payer: Medicare Other | Attending: Neurology | Admitting: Physical Therapy

## 2019-02-26 DIAGNOSIS — R293 Abnormal posture: Secondary | ICD-10-CM | POA: Diagnosis present

## 2019-02-26 DIAGNOSIS — M542 Cervicalgia: Secondary | ICD-10-CM | POA: Insufficient documentation

## 2019-02-26 DIAGNOSIS — G8929 Other chronic pain: Secondary | ICD-10-CM

## 2019-02-26 DIAGNOSIS — R51 Headache: Secondary | ICD-10-CM | POA: Insufficient documentation

## 2019-02-26 DIAGNOSIS — M6281 Muscle weakness (generalized): Secondary | ICD-10-CM | POA: Diagnosis present

## 2019-02-26 NOTE — Therapy (Signed)
[facility] [address] [place], [place], [address] Phone: [phone]   Fax:  [phone]  Physical Therapy Evaluation  Patient Details  Name: Brianna [name] MRN: [id number] Date of Birth: [birth year] Referring Provider (PT): Anson Fret, MD    Encounter Date: 02/26/2019  PT End of Session - 02/26/19 1017    Visit Number  1    Number of Visits  13    Date for PT Re-Evaluation  04/09/19    Authorization Type  MCR: Kx mod by 15th visit, progress note at 10th visit    PT Start Time  0930    PT Stop Time  1014    PT Time Calculation (min)  44 min    Activity Tolerance  Patient tolerated treatment well    Behavior During Therapy  [facility] for tasks assessed/performed       Past Medical History:  Diagnosis Date  . Arthritis    in the neck  . Carotid artery occlusion   . Epilepsy (HCC)   . GERD (gastroesophageal reflux disease)   . Glaucoma   . Hyperlipidemia   . Hypertension   . Keratoconus of both eyes 1981  . Macular degeneration   . Migraines   . Neuromuscular disorder (HCC)    RA  . Peripheral vascular disease (HCC)    carotid occlusion surgery on left  . Raynaud's disease   . Retinal edema   . Rheumatoid arthritis(714.0)   . Stroke [facility])    two mild strokes presumed from left carotid stenosis    Past Surgical History:  Procedure Laterality Date  . CAROTID ARTERY ANGIOPLASTY  2008   Dr Madilyn Fireman  . CAROTID ENDARTERECTOMY Left 11-15-2007  . cataract extraction Right 04-2014   [facility]  . CHOLECYSTECTOMY  1995  . CORNEAL TRANSPLANT     x 5 ; steroid inj. retnal information  . CORNEAL TRANSPLANT Right 01-22-2014   [facility]  . EYE SURGERY Right 12/2016   cornea repair   . EYE SURGERY Right 09/20/2017   cornea transplant   . GLAUCOMA SURGERY Right 2018  . TRIGGER FINGER RELEASE Right 05/16/2018   Procedure: RELEASE TRIGGER FINGER/A-1 PULLEY RIGHT THUMB;  Surgeon: Cindee Salt, MD;  Location: [facility];  Service: Orthopedics;  Laterality: Right;    There were no vitals filed for this visit.   Subjective Assessment - 02/26/19 0937    Subjective  pt is a 68 y.o F with CC of chronic neck pain/ HA that had initially gotten better with PT treatment but had returned back in April 2020 due to Covid closures specifically the gym and being unable to get BOTOX injections. pain is primarly on the L side but can fluctuate to the R. Since recent onset she reports she did just BOTOX again ~3 weeks ago so she reports the symptoms are improving.    Limitations  Standing    How long can you sit comfortably?  unlimited    How long can you stand comfortably?  unlimited    How long can you walk comfortably?  unlimited    Diagnostic tests  N/A    Patient Stated Goals  increaes neck ROM, decrase pain, HA severity    Currently in Pain?  Yes    Pain Score  6    at worst  10/10   Pain Location  Neck    Pain Orientation  Right;Left   L >R   Pain Descriptors / Indicators  Aching;Tightness    Pain Type  Chronic pain    Pain Onset  More than a month ago    Pain Frequency  Intermittent    Aggravating Factors   reaching up with the L shoulder ,sleeping/laying down, prolong reading    Pain Relieving Factors  botox injections, migraine medication, massager, tennis balls,    Effect of Pain on Daily Activities  limited MTPR         [facility] - 02/26/19 0947      Assessment   Medical Diagnosis  Intractable chronic migraine without aura and without status migrainosus ,  Cervical myofascial pain syndrome     Referring Provider (PT)  Anson FretAhern, Antonia B, MD     Onset Date/Surgical Date  --   recent exacerbation back in april 2020   Hand Dominance  Right    Next MD Visit  --   October 2020   Prior Therapy  yes      Precautions   Precautions  None      Restrictions   Weight Bearing Restrictions  No      Balance Screen   Has the patient fallen in the past 6 months  No    Has the  patient had a decrease in activity level because of a fear of falling?   No    Is the patient reluctant to leave their home because of a fear of falling?   No      Home Public house managernvironment   Living Environment  Private residence    Living Arrangements  Spouse/significant other    Available Help at Discharge  Family    Type of Home  House    Home Access  Stairs to enter    Entrance Stairs-Number of Steps  3    Entrance Stairs-Rails  Right    Home Layout  Two level    Alternate Level Stairs-Number of Steps  12    Alternate Level Stairs-Rails  Right;Left      Prior Function   Level of Independence  Independent with basic ADLs    Vocation  Retired      IT consultantCognition   Overall Cognitive Status  Within Functional Limits for tasks assessed      Observation/Other Assessments   Focus on Therapeutic Outcomes (FOTO)   not set-up in FOTO      Posture/Postural Control   Posture/Postural Control  Postural limitations    Postural Limitations  Rounded Shoulders;Forward head      ROM / Strength   AROM / PROM / Strength  AROM;Strength      AROM   Overall AROM Comments  shoulder WFL with pain noted during L shoulder flexion,     AROM Assessment Site  Cervical;Shoulder    Right/Left Shoulder  Right;Left    Cervical Flexion  42    Cervical Extension  44    Cervical - Right Side Bend  44   end range soreness on the L side   Cervical - Left Side Bend  44   Pain during movement   Cervical - Right Rotation  61    Cervical - Left Rotation  60   pain during movmeent     Strength   Strength Assessment Site  Shoulder;Hand    Right/Left Shoulder  Right;Left    Right Shoulder Flexion  4/5    Right Shoulder Extension  4/5    Right Shoulder ABduction  4/5    Right Shoulder Internal Rotation  4/5  Right Shoulder External Rotation  4/5    Left Shoulder Flexion  4-/5    Left Shoulder Extension  4/5    Left Shoulder ABduction  4-/5    Left Shoulder Internal Rotation  4-/5    Left Shoulder External  Rotation  4-/5      Palpation   Palpation comment  TTP along L upper trap/ levator scapulae with multiple trigger points, sub-occipitals and splenus capitius                Objective measurements completed on examination: See above findings.      [facility]/Exercise - 02/26/19 0947      Exercises   Exercises  Neck;Shoulder      Shoulder Exercises: Seated   Other Seated Exercises  scapular retraction with bil shoulder ER 1 x 10 with red theraband      Manual Therapy   Manual Therapy  Soft tissue mobilization    Manual therapy comments  skilled palpation and monitoring of pt throughout TPDN    Soft tissue mobilization  IASTM along R upper trap, levator scapulae and sub-occipitals       Trigger Point Dry Needling - 02/26/19 0001    Consent Given?  Yes    Muscles Treated Head and Neck  Suboccipitals;Upper trapezius;Levator scapulae    Other Dry Needling  sub-occipitals L only    Upper Trapezius Response  Twitch reponse elicited;Palpable increased muscle length    Levator Scapulae Response  Twitch response elicited;Palpable increased muscle length           PT Education - 02/26/19 1017    Education Details  evaluation findings, POC, goals, HEP with proper form/ rationale. reviewed TPDN and benefits    Person(s) Educated  Patient    Methods  Explanation;Verbal cues;Handout    Comprehension  Verbalized understanding;Verbal cues required       PT Short Term Goals - 02/26/19 1040      PT SHORT TERM GOAL #1   Title  pt to be I with initial HEP    Time  3    Period  Weeks    Status  New    Target Date  09/19/18        PT Long Term Goals - 02/26/19 1040      PT LONG TERM GOAL #1   Title  pt to demo proper posture and lifting mechancis to reduce and prevent neck/ shoulder pain     Baseline  -    Time  6    Period  Weeks    Status  New    Target Date  04/09/19      PT LONG TERM GOAL #2   Title  report decreased spasm inthe upper traps and  surrounding musculature to reduce pain to </= 2/10 and maintain cerivcal mobility    Baseline  -    Time  6    Period  Weeks    Status  New    Target Date  04/09/19      PT LONG TERM GOAL #3   Title  pt to return to the gym / home rountine and report decreased HA occurence by >/= 1 week for pt goals and promote QOL    Baseline  -    Time  6    Period  Weeks    Status  New    Target Date  04/09/19      PT LONG TERM GOAL #4   Title  pt to be I with all HEP given as of last visit to maintain and progress current level of function    Baseline  -    Time  6    Period  Weeks    Status  New    Target Date  04/09/19      PT LONG TERM GOAL #5   Title  -             Plan - 02/26/19 1018    Clinical Impression Statement  pt presents to OPPT with CC of chronic neck pain with exacerbation back in Aprril which she attributes is due to covid related gym and clinic closures and was unable to get her Botox injections. She demonstrates functional neck and shoulder ROM with pain noted in the L upper trap with cervical movements. MMT revealed weakness inthe L shoulder compared bil. educated on TPDN and consent was provided for upper trap/ levator scapulae and sub-occipitals followed with IASTM techniques. She would benefit from physical therapy to decrease neck/ shoulder pain, reduce HA occurence and maximize her function by addressing the deficits listed.    Personal Factors and Comorbidities  Age;Comorbidity 2    Comorbidities  hx or HA, cervicaliga, osteoporosis    Examination-Activity Limitations  Lift    Stability/Clinical Decision Making  Evolving/Moderate complexity    Clinical Decision Making  Moderate    Rehab Potential  Good    PT Frequency  2x / week    PT Duration  6 weeks    PT Treatment/Interventions  ADLs/Self Care Home Management;Cryotherapy;Electrical Stimulation;Iontophoresis 4mg /ml Dexamethasone;Moist Heat;Ultrasound;Therapeutic activities;Therapeutic exercise;Dry  needling;Taping;Manual techniques;Patient/family education;Balance training;Neuromuscular re-education;Passive range of motion    PT Next Visit Plan  review/ update HEP PRN, response to TPDN posterior cervical/ hsoulder structures. STW , shoulder strengthening, posture training, sleeping positioning,    PT Home Exercise Plan  upper trap stretching, levator scapulae, chin tucks, scapular retraction    Consulted and Agree with Plan of Care  Patient       Patient will benefit from skilled therapeutic intervention in order to improve the following deficits and impairments:  Improper body mechanics, Decreased strength, Decreased knowledge of precautions, Decreased balance, Pain, Decreased endurance, Impaired vision/preception, Increased fascial restricitons, Decreased activity tolerance, Postural dysfunction  Visit Diagnosis: 1. Chronic nonintractable headache, unspecified headache type   2. Abnormal posture   3. Cervicalgia   4. Muscle weakness (generalized)        Problem List Patient Active Problem List   Diagnosis Date Noted  . URI, acute 05/19/2018  . Cerumen impaction 05/19/2018  . Trigger thumb, right thumb 10/05/2017  . History of macular degeneration 09/13/2016  . History of migraine 09/06/2016  . History of seizures 09/06/2016  . Iritis of right eye 05/12/2016  . Rheumatoid arthritis with positive rheumatoid factor ([place]) 05/11/2016  . 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  . High risk medication use 05/11/2016  . Osteoporosis 05/11/2016  . 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 History of carotid endarterectomy 06/27/2014  . Migraine without aura 04/19/2013  . Seizures ([place]) 04/19/2013  . 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 Cough 01/14/2011  . Pulmonary fibrosis ([place]) 01/14/2011   Starr Lake PT, DPT, LAT, ATC  02/26/19  10:59 AM      [facility] [address] [place], [place], [address] Phone: [phone]   Fax:  [phone]  Name: Brianna [name] MRN: [id number] Date of Birth: 1951/04/27

## 2019-03-06 ENCOUNTER — Telehealth: Payer: Self-pay | Admitting: Rheumatology

## 2019-03-06 NOTE — Telephone Encounter (Signed)
Patient calling because last time she was here, 12/13/2018 doctor was to order a Bone Density Scan. Per patent, no one called her to schedule this. Please call to advise status of this referral.

## 2019-03-06 NOTE — Telephone Encounter (Signed)
I called patient, order faxed to [facility], pending appt

## 2019-03-09 ENCOUNTER — Encounter: Payer: Self-pay | Admitting: Physical Therapy

## 2019-03-09 ENCOUNTER — Ambulatory Visit: Payer: Medicare Other | Admitting: Physical Therapy

## 2019-03-09 ENCOUNTER — Other Ambulatory Visit: Payer: Self-pay

## 2019-03-09 DIAGNOSIS — R51 Headache: Secondary | ICD-10-CM | POA: Diagnosis not present

## 2019-03-09 DIAGNOSIS — M6281 Muscle weakness (generalized): Secondary | ICD-10-CM

## 2019-03-09 DIAGNOSIS — G8929 Other chronic pain: Secondary | ICD-10-CM

## 2019-03-09 DIAGNOSIS — R293 Abnormal posture: Secondary | ICD-10-CM

## 2019-03-09 DIAGNOSIS — M542 Cervicalgia: Secondary | ICD-10-CM

## 2019-03-09 NOTE — Therapy (Signed)
[place], [place], [address] Phone: [phone]   Fax:  [phone]  Physical Therapy Treatment  Patient Details  Name: Brianna [name] MRN: [id number] Date of Birth: 1950/09/01 Referring Provider (PT): Melvenia Beam, MD    Encounter Date: 03/09/2019  PT End of Session - 03/09/19 1152    Visit Number  2    Number of Visits  13    Date for PT Re-Evaluation  04/09/19    Authorization Type  MCR: Kx mod by 15th visit, progress note at 10th visit    PT Start Time  0804    PT Stop Time  0847    PT Time Calculation (min)  43 min    Activity Tolerance  Patient tolerated treatment well    Behavior During Therapy  [facility] for tasks assessed/performed       Past Medical History:  Diagnosis Date  . Arthritis    in the neck  . Carotid artery occlusion   . Epilepsy ([employer])   . GERD (gastroesophageal reflux disease)   . Glaucoma   . Hyperlipidemia   . Hypertension   . Keratoconus of both eyes 1981  . Macular degeneration   . Migraines   . Neuromuscular disorder (HCC)    RA  . Peripheral vascular disease ([place])    carotid occlusion surgery on left  . Raynaud's disease   . Retinal edema   . Rheumatoid arthritis(714.0)   . Stroke [facility])    two mild strokes presumed from left carotid stenosis    Past Surgical History:  Procedure Laterality Date  . CAROTID ARTERY ANGIOPLASTY  2008   Dr Amedeo Plenty  . CAROTID ENDARTERECTOMY Left 11-15-2007  . cataract extraction Right 04-2014   [facility]  . CORNEAL TRANSPLANT     x 5 ; steroid inj. retnal information  . CORNEAL TRANSPLANT Right 01-22-2014   [facility]  . EYE SURGERY Right 12/2016   cornea repair   . EYE SURGERY Right 09/20/2017   cornea transplant   . GLAUCOMA SURGERY Right 2018  . TRIGGER FINGER RELEASE Right 05/16/2018   Procedure: RELEASE TRIGGER FINGER/A-1 PULLEY RIGHT THUMB;  Surgeon: Daryll Brod, MD;  Location: [facility];  Service: Orthopedics;  Laterality: Right;    There were no vitals filed for this visit.  Subjective Assessment - 03/09/19 1147    Subjective  Patient reports her nek is improved compared to last visit. She has some pain in the left side of her neck. She has a mild headache today. The last time she had a migrane was Wednesday. That wasthe only headache that she had.    Limitations  Standing    How long can you sit comfortably?  unlimited    How long can you stand comfortably?  unlimited    How long can you walk comfortably?  unlimited    Diagnostic tests  N/A    Patient Stated Goals  increaes neck ROM, decrase pain, HA severity    Currently in Pain?  Yes    Pain Score  3     Pain Location  Neck    Pain Orientation  Left    Pain Descriptors / Indicators  Aching    Pain Type  Chronic pain    Pain Onset  More than a month ago    Pain Frequency  Intermittent    Aggravating Factors   reaching with the left shoulder  Pain Relieving Factors  botox    Effect of Pain on Daily Activities  headaches                       [facility] Adult PT Treatment/Exercise - 03/09/19 0001      Neck Exercises: Supine   Other Supine Exercise  supine ER x20       Shoulder Exercises: Seated   Other Seated Exercises  bilateral ER x10 red; horizontal abduction 2x10;       Shoulder Exercises: Standing   Extension Limitations  2x10 shown how to secure on the door     Row Limitations  2x10 shown how to secure on the door.       Manual Therapy   Manual Therapy  Soft tissue mobilization;Manual Traction    Manual therapy comments  skilled palpation and monitoring of pt throughout TPDN    Soft tissue mobilization  trigger point release to upper traps and cerival parapsinals     Manual Traction  gentle manualtraction and sub-occipital release       Trigger Point Dry Needling - 03/09/19 0001    Consent Given?  Yes    Muscles Treated Head and Neck  Cervical multifidi;Suboccipitals     Dry Needling Comments  30x45 needle     Other Dry Needling  sub-occipitals L only    Cervical multifidi Response  Twitch reponse elicited           PT Education - 03/09/19 1152    Education Details  updated stretches    Person(s) Educated  Patient    Methods  Explanation;Demonstration;Tactile cues;Verbal cues    Comprehension  Verbalized understanding;Returned demonstration;Verbal cues required;Tactile cues required       PT Short Term Goals - 02/26/19 1040      PT SHORT TERM GOAL #1   Title  pt to be I with initial HEP    Time  3    Period  Weeks    Status  New    Target Date  09/19/18        PT Long Term Goals - 03/09/19 1155      PT LONG TERM GOAL #1   Title  pt to demo proper posture and lifting mechancis to reduce and prevent neck/ shoulder pain     Time  6    Period  Weeks    Status  On-going      PT LONG TERM GOAL #2   Title  report decreased spasm inthe upper traps and surrounding musculature to reduce pain to </= 2/10 and maintain cerivcal mobility    Time  6    Status  New      PT LONG TERM GOAL #3   Title  pt to return to the gym / home rountine and report decreased HA occurence by >/= 1 week for pt goals and promote QOL    Time  6    Period  Weeks    Status  On-going      PT LONG TERM GOAL #4   Title  pt to be I with all HEP given as of last visit to maintain and progress current level of function    Time  6    Period  Weeks    Status  On-going            Plan - 03/09/19 1153    Clinical Impression Statement  Patient tolerated treatment well. She reports she hasnt been doing her  band exercises because she is afraid it will come out of the door. She was shown how to secure it in the door knob and she was also shown how to do exercises without using the door. She tolerated treatment well. She had a good twitch repsonse to all needles. Therapy will continue to progress exercises and strethes as tolerated.    Personal Factors and  Comorbidities  Age;Comorbidity 2    Comorbidities  hx or HA, cervicaliga, osteoporosis    Examination-Activity Limitations  Lift    Stability/Clinical Decision Making  Evolving/Moderate complexity    Clinical Decision Making  Moderate    Rehab Potential  Good    PT Frequency  2x / week    PT Duration  6 weeks    PT Treatment/Interventions  ADLs/Self Care Home Management;Cryotherapy;Electrical Stimulation;Iontophoresis 4mg /ml Dexamethasone;Moist Heat;Ultrasound;Therapeutic activities;Therapeutic exercise;Dry needling;Taping;Manual techniques;Patient/family education;Balance training;Neuromuscular re-education;Passive range of motion    PT Next Visit Plan  review/ update HEP PRN, response to TPDN posterior cervical/ hsoulder structures. STW , shoulder strengthening, posture training, sleeping positioning,    PT Home Exercise Plan  upper trap stretching, levator scapulae, chin tucks, scapular retraction    Consulted and Agree with Plan of Care  Patient       Patient will benefit from skilled therapeutic intervention in order to improve the following deficits and impairments:  Improper body mechanics, Decreased strength, Decreased knowledge of precautions, Decreased balance, Pain, Decreased endurance, Impaired vision/preception, Increased fascial restricitons, Decreased activity tolerance, Postural dysfunction  Visit Diagnosis: Chronic nonintractable headache, unspecified headache type  Abnormal posture  Cervicalgia  Muscle weakness (generalized)     Problem List Patient Active Problem List   Diagnosis Date Noted  . URI, acute 05/19/2018  . Cerumen impaction 05/19/2018  . Trigger thumb, right thumb 10/05/2017  . History of macular degeneration 09/13/2016  . History of migraine 09/06/2016  . History of seizures 09/06/2016  . Iritis of right eye 05/12/2016  . Rheumatoid arthritis with positive rheumatoid factor (HCC) 05/11/2016  . 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  . High  risk medication use 05/11/2016  . Osteoporosis 05/11/2016  . 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 History of carotid endarterectomy 06/27/2014  . Migraine without aura 04/19/2013  . Seizures (HCC) 04/19/2013  . 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 Cough 01/14/2011  . Pulmonary fibrosis (HCC) 01/14/2011    Dessie Coma PT DPT  03/09/2019, 12:00 PM  [facility] [address] [place], [place], [address] Phone: [phone]   Fax:  [phone]  Name: Brianna [name] MRN: [id number] Date of Birth: 21-Apr-1951

## 2019-03-14 ENCOUNTER — Other Ambulatory Visit: Payer: Self-pay

## 2019-03-14 ENCOUNTER — Encounter: Payer: Self-pay | Admitting: Physical Therapy

## 2019-03-14 ENCOUNTER — Ambulatory Visit: Payer: Medicare Other | Admitting: Physical Therapy

## 2019-03-14 DIAGNOSIS — M542 Cervicalgia: Secondary | ICD-10-CM

## 2019-03-14 DIAGNOSIS — R293 Abnormal posture: Secondary | ICD-10-CM

## 2019-03-14 DIAGNOSIS — G8929 Other chronic pain: Secondary | ICD-10-CM

## 2019-03-14 DIAGNOSIS — M6281 Muscle weakness (generalized): Secondary | ICD-10-CM

## 2019-03-14 DIAGNOSIS — R51 Headache: Secondary | ICD-10-CM | POA: Diagnosis not present

## 2019-03-14 NOTE — Therapy (Signed)
[facility] Outpatient Rehabilitation [facility] [address] [place], [place], [address] Phone: [phone]   Fax:  [phone]  Physical Therapy Treatment  Patient Details  Name: Brianna [name] MRN: [id number] Date of Birth: [birth year] Referring Provider (PT): Anson Fret, MD    Encounter Date: 03/14/2019  PT End of Session - 03/14/19 0808    Visit Number  3    Number of Visits  13    Date for PT Re-Evaluation  04/09/19    Authorization Type  MCR: Kx mod by 15th visit, progress note at 10th visit    PT Start Time  0808   pt arrived 8 min late   PT Stop Time  0842    PT Time Calculation (min)  34 min    Activity Tolerance  Patient tolerated treatment well    Behavior During Therapy  [facility] for tasks assessed/performed       Past Medical History:  Diagnosis Date  . Arthritis    in the neck  . Carotid artery occlusion   . Epilepsy (HCC)   . GERD (gastroesophageal reflux disease)   . Glaucoma   . Hyperlipidemia   . Hypertension   . Keratoconus of both eyes 1981  . Macular degeneration   . Migraines   . Neuromuscular disorder (HCC)    RA  . Peripheral vascular disease (HCC)    carotid occlusion surgery on left  . Raynaud's disease   . Retinal edema   . Rheumatoid arthritis(714.0)   . Stroke [facility])    two mild strokes presumed from left carotid stenosis    Past Surgical History:  Procedure Laterality Date  . CAROTID ARTERY ANGIOPLASTY  2008   Dr Madilyn Fireman  . CAROTID ENDARTERECTOMY Left 11-15-2007  . cataract extraction Right 04-2014   [facility]  . CHOLECYSTECTOMY  1995  . CORNEAL TRANSPLANT     x 5 ; steroid inj. retnal information  . CORNEAL TRANSPLANT Right 01-22-2014   [facility]  . EYE SURGERY Right 12/2016   cornea repair   . EYE SURGERY Right 09/20/2017   cornea transplant   . GLAUCOMA SURGERY Right 2018  . TRIGGER FINGER RELEASE Right 05/16/2018   Procedure: RELEASE TRIGGER FINGER/A-1 PULLEY RIGHT THUMB;  Surgeon: Cindee Salt,  MD;  Location: [facility];  Service: Orthopedics;  Laterality: Right;    There were no vitals filed for this visit.  Subjective Assessment - 03/14/19 0809    Subjective  "I am really doing quite well, last migraine I had was last weekend which I think was due to heat exhaustion. I did my exercises and it really helped. I was thinking if I could push all my visits out past my cornea transplant and return following that procedure which could take 3 weeks before I could return back which I expect to be more sore due to limited movement im allowed to do"    Currently in Pain?  Yes    Pain Score  1     Pain Location  Neck    Pain Orientation  Left    Pain Descriptors / Indicators  Aching    Pain Type  Chronic pain    Pain Onset  More than a month ago    Pain Frequency  Intermittent                       [facility] Adult PT Treatment/Exercise - 03/14/19 0001      Neck Exercises: Standing  Other Standing Exercises  cervical stablization extension, bil sidebending 1 x 10 holding 5 sec ea.    Other Standing Exercises  resisted rotation with towel 1 x 10 holding 5 sec ea.      Neck Exercises: Supine   Capital Flexion  10 reps;5 secs      Neck Exercises: Sidelying   Lateral Flexion  Both;10 reps   10 sec hold   Other Sidelying Exercise  cerveral rotation with sidebend 1 x 10, bil       Shoulder Exercises: Standing   Extension  Strengthening;15 reps;Theraband    Theraband Level (Shoulder Extension)  Level 3 (Green)    Row  Strengthening;Both;15 reps;Theraband    Theraband Level (Shoulder Row)  Level 3 (Green)      Shoulder Exercises: ROM/Strengthening   UBE (Upper Arm Bike)  L1 x 4  min             PT Education - 03/14/19 6237    Education Details  updated HEP for cervical stability, discussed POC. reviewed how to perform band exercises secureing the band around the door knob.    Person(s) Educated  Patient    Methods  Explanation;Verbal cues;Handout     Comprehension  Verbalized understanding;Verbal cues required       PT Short Term Goals - 02/26/19 1040      PT SHORT TERM GOAL #1   Title  pt to be I with initial HEP    Time  3    Period  Weeks    Status  New    Target Date  09/19/18        PT Long Term Goals - 03/09/19 1155      PT LONG TERM GOAL #1   Title  pt to demo proper posture and lifting mechancis to reduce and prevent neck/ shoulder pain     Time  6    Period  Weeks    Status  On-going      PT LONG TERM GOAL #2   Title  report decreased spasm inthe upper traps and surrounding musculature to reduce pain to </= 2/10 and maintain cerivcal mobility    Time  6    Status  New      PT LONG TERM GOAL #3   Title  pt to return to the gym / home rountine and report decreased HA occurence by >/= 1 week for pt goals and promote QOL    Time  6    Period  Weeks    Status  On-going      PT LONG TERM GOAL #4   Title  pt to be I with all HEP given as of last visit to maintain and progress current level of function    Time  6    Period  Weeks    Status  On-going            Plan - 03/14/19 0830    Clinical Impression Statement  pt presents to OPPT noting significant improvement since the last session. pt opted to focus on strengthening exercises today and request to extend her visits to after she has her cornea transplant due to prolonged limitations and positional restrictions she anticipates having issues. pt did well with exercises today and plan to see her back after her corneal transplant.    PT Next Visit Plan  ERO, Review HEP, response to TPDN posterior cervical/ hsoulder structures. STW , shoulder strengthening, posture training, sleeping positioning,    PT  Home Exercise Plan  upper trap stretching, levator scapulae, chin tucks, scapular retraction, cervical stabilization    Consulted and Agree with Plan of Care  Patient       Patient will benefit from skilled therapeutic intervention in order to improve the  following deficits and impairments:  Improper body mechanics, Decreased strength, Decreased knowledge of precautions, Decreased balance, Pain, Decreased endurance, Impaired vision/preception, Increased fascial restricitons, Decreased activity tolerance, Postural dysfunction  Visit Diagnosis: Chronic nonintractable headache, unspecified headache type  Abnormal posture  Cervicalgia  Muscle weakness (generalized)     Problem List Patient Active Problem List   Diagnosis Date Noted  . URI, acute 05/19/2018  . Cerumen impaction 05/19/2018  . Trigger thumb, right thumb 10/05/2017  . History of macular degeneration 09/13/2016  . History of migraine 09/06/2016  . History of seizures 09/06/2016  . Iritis of right eye 05/12/2016  . Rheumatoid arthritis with positive rheumatoid factor (HCC) 05/11/2016  . 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  . High risk medication use 05/11/2016  . Osteoporosis 05/11/2016  . 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 History of carotid endarterectomy 06/27/2014  . Migraine without aura 04/19/2013  . Seizures (HCC) 04/19/2013  . 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 Cough 01/14/2011  . Pulmonary fibrosis (HCC) 01/14/2011   Lulu Riding PT, DPT, LAT, ATC  03/14/19  8:44 AM      [facility] Outpatient Rehabilitation [facility] [address] [place], [place], [address] Phone: [phone]   Fax:  [phone]  Name: KATLYN LANDUYT MRN: [id number] Date of Birth: 1950-10-11

## 2019-03-16 ENCOUNTER — Ambulatory Visit: Payer: Medicare Other | Admitting: Physical Therapy

## 2019-03-21 ENCOUNTER — Ambulatory Visit: Payer: Medicare Other | Admitting: Physical Therapy

## 2019-03-23 ENCOUNTER — Ambulatory Visit: Payer: Medicare Other | Admitting: Physical Therapy

## 2019-03-28 ENCOUNTER — Ambulatory Visit: Payer: Medicare Other | Admitting: Physical Therapy

## 2019-03-30 ENCOUNTER — Ambulatory Visit: Payer: Medicare Other | Admitting: Physical Therapy

## 2019-04-04 ENCOUNTER — Encounter: Payer: Medicare Other | Admitting: Physical Therapy

## 2019-04-06 ENCOUNTER — Encounter: Payer: Medicare Other | Admitting: Physical Therapy

## 2019-04-06 ENCOUNTER — Other Ambulatory Visit: Payer: Self-pay | Admitting: Internal Medicine

## 2019-04-06 DIAGNOSIS — R05 Cough: Secondary | ICD-10-CM

## 2019-04-06 DIAGNOSIS — R059 Cough, unspecified: Secondary | ICD-10-CM

## 2019-04-06 MED ORDER — FAMOTIDINE 20 MG PO TABS: 20.0000 mg | ORAL_TABLET | Freq: Every day | ORAL | 1 refills | Status: DC

## 2019-04-11 ENCOUNTER — Encounter: Payer: Medicare Other | Admitting: Physical Therapy

## 2019-04-13 ENCOUNTER — Encounter: Payer: Medicare Other | Admitting: Physical Therapy

## 2019-04-15 ENCOUNTER — Other Ambulatory Visit: Payer: Self-pay | Admitting: Neurology

## 2019-04-17 HISTORY — PX: CORNEAL TRANSPLANT: SHX108

## 2019-05-01 ENCOUNTER — Ambulatory Visit: Payer: Medicare Other | Admitting: Neurology

## 2019-05-01 ENCOUNTER — Other Ambulatory Visit: Payer: Self-pay

## 2019-05-01 VITALS — Temp 97.8°F

## 2019-05-01 DIAGNOSIS — G43719 Chronic migraine without aura, intractable, without status migrainosus: Secondary | ICD-10-CM | POA: Diagnosis not present

## 2019-05-01 DIAGNOSIS — Z79899 Other long term (current) drug therapy: Secondary | ICD-10-CM

## 2019-05-01 NOTE — Addendum Note (Signed)
Addended by: Sarina Ill B on: 05/01/2019 10:04 AM   Modules accepted: Orders

## 2019-05-01 NOTE — Progress Notes (Signed)
Interval history 05/01/2019: She has >50% improvement in frequency of migraines, she is thrilled. Baseline 12-15 migraine days a month and daily headaches. + masseters. if needed.Dry needling: cervical myofascial pain, Send back to dry needling - [address] for cervical myofascial pain syndrome. Gave samples for Nurtec for her to try as well acutely at last visit.  Tried: VPA, Topamax, antidepressants, norvasc, phenergan, frova, aimovig, ajovy, fioricet, (save 6 As)     Consent Form Botulism Toxin Injection For Chronic Migraine    Reviewed orally with patient, additionally signature is on file:  Botulism toxin has been approved by the Federal drug administration for treatment of chronic migraine. Botulism toxin does not cure chronic migraine and it may not be effective in some patients.  The administration of botulism toxin is accomplished by injecting a small amount of toxin into the muscles of the neck and head. Dosage must be titrated for each individual. Any benefits resulting from botulism toxin tend to wear off after 3 months with a repeat injection required if benefit is to be maintained. Injections are usually done every 3-4 months with maximum effect peak achieved by about 2 or 3 weeks. Botulism toxin is expensive and you should be sure of what costs you will incur resulting from the injection.  The side effects of botulism toxin use for chronic migraine may include:   -Transient, and usually mild, facial weakness with facial injections  -Transient, and usually mild, head or neck weakness with head/neck injections  -Reduction or loss of forehead facial animation due to forehead muscle weakness  -Eyelid drooping  -Dry eye  -Pain at the site of injection or bruising at the site of injection  -Double vision  -Potential unknown long term risks  Contraindications: You should not have Botox if you are pregnant, nursing, allergic to albumin, have an infection, skin condition, or  muscle weakness at the site of the injection, or have myasthenia gravis, Lambert-Eaton syndrome, or ALS.  It is also possible that as with any injection, there may be an allergic reaction or no effect from the medication. Reduced effectiveness after repeated injections is sometimes seen and rarely infection at the injection site may occur. All care will be taken to prevent these side effects. If therapy is given over a long time, atrophy and wasting in the muscle injected may occur. Occasionally the patient's become refractory to treatment because they develop antibodies to the toxin. In this event, therapy needs to be modified.  I have read the above information and consent to the administration of botulism toxin.    BOTOX PROCEDURE NOTE FOR MIGRAINE HEADACHE    Contraindications and precautions discussed with patient(above). Aseptic procedure was observed and patient tolerated procedure. Procedure performed by Dr. Artemio Aly  The condition has existed for more than 6 months, and pt does not have a diagnosis of ALS, Myasthenia Gravis or Lambert-Eaton Syndrome.  Risks and benefits of injections discussed and pt agrees to proceed with the procedure.  Written consent obtained  These injections are medically necessary. Pt  receives good benefits from these injections. These injections do not cause sedations or hallucinations which the oral therapies may cause.  Description of procedure:  The patient was placed in a sitting position. The standard protocol was used for Botox as follows, with 5 units of Botox injected at each site:   -Procerus muscle, midline injection DID NOT PERFORM  -Corrugator muscle, bilateral injection DID NOT PERFORM  -Frontalis muscle, bilateral injection, with 2 sites each side, medial  injection was performed in the upper one third of the frontalis muscle, in the region vertical from the medial inferior edge of the superior orbital rim. The lateral injection was again  in the upper one third of the forehead vertically above the lateral limbus of the cornea, 1.5 cm lateral to the medial injection site.  -Temporalis muscle injection, 4 sites, bilaterally. The first injection was 3 cm above the tragus of the ear, second injection site was 1.5 cm to 3 cm up from the first injection site in line with the tragus of the ear. The third injection site was 1.5-3 cm forward between the first 2 injection sites. The fourth injection site was 1.5 cm posterior to the second injection site.   -Occipitalis muscle injection, 3 sites, bilaterally. The first injection was done one half way between the occipital protuberance and the tip of the mastoid process behind the ear. The second injection site was done lateral and superior to the first, 1 fingerbreadth from the first injection. The third injection site was 1 fingerbreadth superiorly and medially from the first injection site.  -Cervical paraspinal muscle injection, 2 sites, bilateral knee first injection site was 1 cm from the midline of the cervical spine, 3 cm inferior to the lower border of the occipital protuberance. The second injection site was 1.5 cm superiorly and laterally to the first injection site.  -Trapezius muscle injection was performed at 3 sites, bilaterally. The first injection site was in the upper trapezius muscle halfway between the inflection point of the neck, and the acromion. The second injection site was one half way between the acromion and the first injection site. The third injection was done between the first injection site and the inflection point of the neck.   Will return for repeat injection in 3 months.   200 units of Botox was used, any Botox not injected was wasted. The patient tolerated the procedure well, there were no complications of the above procedure.

## 2019-05-01 NOTE — Progress Notes (Signed)
Botox- 100 units x 2 vials Lot: [id number] Expiration: 12/2021 NDC: 8546-2703-50  Bacteriostatic 0.9% Sodium Chloride- 42mL total Lot: [id number] Expiration: 01/17/2020 NDC: 2993-7169-67  Dx: E93.810 S/P

## 2019-05-02 ENCOUNTER — Other Ambulatory Visit (INDEPENDENT_AMBULATORY_CARE_PROVIDER_SITE_OTHER): Payer: Self-pay

## 2019-05-02 DIAGNOSIS — Z79899 Other long term (current) drug therapy: Secondary | ICD-10-CM

## 2019-05-02 DIAGNOSIS — Z0289 Encounter for other administrative examinations: Secondary | ICD-10-CM

## 2019-05-03 LAB — PHENYTOIN LEVEL, FREE AND TOTAL
Phenytoin, Free: 0.5 ug/mL — ABNORMAL LOW (ref 1.0–2.0)
Phenytoin, Serum: 8.8 ug/mL — ABNORMAL LOW (ref 10.0–20.0)

## 2019-05-03 NOTE — Progress Notes (Signed)
Office Visit Note  [name]             Date of Birth: 30-Apr-1951           MRN: [id number]             PCP: Irena Reichmann, DO Referring: Pearson Grippe, MD Visit Date: 05/17/2019 Occupation: @GUAROCC @  Subjective:  Trochanteric bursitis bilaterally   History of Present Illness: Brianna [name] is a 68 y.o. female with history of seropositive rheumatoid arthritis, ILD, iritis, and osteoporosis.  Patient is on Humira 40 mg subcutaneous injections every 14 days and Arava 20 mg 1 tablet daily.  She denies missing any doses recently.  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  She denies any recent rheumatoid arthritis flares.  She states that she has some discomfort in both hands but denies any joint swelling.  She continues to have trochanter bursitis bilaterally especially when lying on her sides at night. She states that she has been coughing more frequently and has a follow-up visit scheduled with Dr. 04/19/2019 on 06/22/2019.   She is overdue to update DEXA.  Order was placed on 12/13/2018.  Activities of Daily Living:  Patient reports morning stiffness for 10-15  minutes.   Patient Reports nocturnal pain.  Difficulty dressing/grooming: Denies Difficulty climbing stairs: Denies Difficulty getting out of chair: Denies Difficulty using hands for taps, buttons, cutlery, and/or writing: Denies  Review of Systems  Constitutional: Positive for fatigue.  HENT: Negative for mouth sores, mouth dryness and nose dryness.   Eyes: Positive for dryness. Negative for pain and visual disturbance.  Respiratory: Positive for cough. Negative for hemoptysis, shortness of breath and difficulty breathing.   Cardiovascular: Negative for chest pain, palpitations, hypertension and swelling in legs/feet.  Gastrointestinal: Negative for blood in stool, constipation and diarrhea.  Endocrine: Negative for increased  urination.  Genitourinary: Negative for painful urination.  Musculoskeletal: Positive for arthralgias, joint pain and morning stiffness. Negative for joint swelling, myalgias, muscle weakness, muscle tenderness and myalgias.  Skin: Negative for color change, pallor, rash, hair loss, nodules/bumps, skin tightness, ulcers and sensitivity to sunlight.  Allergic/Immunologic: Negative for susceptible to infections.  Neurological: Negative for dizziness, numbness, headaches and weakness.  Hematological: Negative for swollen glands.  Psychiatric/Behavioral: Negative for depressed mood and sleep disturbance. The patient is not nervous/anxious.     PMFS History:  Patient Active Problem List   Diagnosis Date Noted  . URI, acute 05/19/2018  . Cerumen impaction 05/19/2018  . Trigger thumb, right thumb 10/05/2017  . History of macular degeneration 09/13/2016  . History of migraine 09/06/2016  . History of seizures 09/06/2016  . Iritis of right eye 05/12/2016  . Rheumatoid arthritis with positive rheumatoid factor (HCC) 05/11/2016  . 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  . High risk medication use 05/11/2016  . Osteoporosis 05/11/2016  . 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 History of carotid endarterectomy 06/27/2014  . Migraine without aura 04/19/2013  . Seizures (HCC) 04/19/2013  . 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 Cough 01/14/2011  . Pulmonary fibrosis (HCC) 01/14/2011    Past Medical History:  Diagnosis Date  . Arthritis    in the neck  . Carotid artery occlusion   . Epilepsy (HCC)   . GERD (gastroesophageal reflux disease)   . Glaucoma   . Hyperlipidemia   . Hypertension   . Keratoconus  of both eyes 1981  . Macular degeneration   . Migraines   . Neuromuscular disorder (HCC)    RA  . Peripheral vascular disease (HCC)    carotid occlusion surgery on left  . Raynaud's disease   . Retinal edema   . Rheumatoid arthritis(714.0)   .  Stroke [facility])    two mild strokes presumed from left carotid stenosis    Family History  Problem Relation Age of Onset  . Heart disease Mother   . Lung disease Mother        ? disease process  . Uterine cancer Mother   . Heart disease Father   . Clotting disorder Father   . Collagen disease Father   . Cervical cancer Maternal Aunt   . Prostate cancer Maternal Grandfather   . Rheum arthritis Sister   . High Cholesterol Sister   . Epilepsy Sister   . Rheum arthritis Maternal Grandmother   . High Cholesterol Brother   . High blood pressure Brother   . High blood pressure Brother   . High Cholesterol Brother   . High blood pressure Brother   . High Cholesterol Brother    Past Surgical History:  Procedure Laterality Date  . CAROTID ARTERY ANGIOPLASTY  2008   Dr Madilyn Fireman  . CAROTID ENDARTERECTOMY Left 11-15-2007  . cataract extraction Right 04-2014   [facility]  . CHOLECYSTECTOMY  1995  . CORNEAL TRANSPLANT     x 5 ; steroid inj. retnal information  . CORNEAL TRANSPLANT Right 01-22-2014   [facility]  . 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 EYE SURGERY Right 12/2016   cornea repair   . EYE SURGERY Right 09/20/2017   cornea transplant   . GLAUCOMA SURGERY Right 2018  . TRIGGER FINGER RELEASE Right 05/16/2018   Procedure: RELEASE TRIGGER FINGER/A-1 PULLEY RIGHT THUMB;  Surgeon: Cindee Salt, MD;  Location: [facility];  Service: Orthopedics;  Laterality: Right;   Social History   Social History Narrative   Patient is married to [name]), has 2 children   Patient is right handed   Education level is Bachelor's degree   Caffeine consumption is 2 cups daily   Lives at home with husband    Immunization History  Administered Date(s) Administered  . Influenza Split 04/26/2013, 04/08/2017  . Influenza, High Dose Seasonal PF 04/09/2018  . Pneumococcal Conjugate-13 08/27/2013  . Pneumococcal Polysaccharide-23 01/14/2011  . Tdap 11/15/2017     Objective: Vital  Signs: BP (!) 189/103 (BP Location: Left Arm, Patient Position: Sitting, Cuff Size: Normal)   Pulse 83   Resp 12   Ht 5\' 3"  (1.6 m)   Wt 128 lb (58.1 kg)   BMI 22.67 kg/m    Physical Exam Vitals signs and nursing note reviewed.  Constitutional:      Appearance: She is well-developed.  HENT:     Head: Normocephalic and atraumatic.  Eyes:     Conjunctiva/sclera: Conjunctivae normal.  Neck:     Musculoskeletal: Normal range of motion.  Cardiovascular:     Rate and Rhythm: Normal rate and regular rhythm.     Heart sounds: Normal heart sounds.  Pulmonary:     Effort: Pulmonary effort is normal.     Breath sounds: Normal breath sounds.  Abdominal:     General: Bowel sounds are normal.     Palpations: Abdomen is soft.  Lymphadenopathy:     Cervical: No cervical adenopathy.  Skin:    General: Skin is warm and dry.  Capillary Refill: Capillary refill takes less than 2 seconds.  Neurological:     Mental Status: She is alert and oriented to person, place, and time.  Psychiatric:        Behavior: Behavior normal.      Musculoskeletal Exam: C-spine Xilin with range of motion.  Thoracic lumbar spine good range of motion.  No midline spinal tenderness.  No SI joint tenderness.  Children's, but joints, wrist joints, MCPs, PIPs, DIPs good range of motion no synovitis.  Synovial thickening of bilateral first MCP joints.  She has bilateral CMC joint synovial thickening.  Hip joints, knee joints, ankle joints, MTPs, PIPs and DIPs good range of motion no synovitis.  No warmth or effusion of bilateral knee joints.  No tenderness or swelling of ankle joints.  She has tenderness over bilateral trochanteric bursa.  CDAI Exam: CDAI Score: - Patient Global: -; Provider Global: - Swollen: -; Tender: - Joint Exam   No joint exam has been documented for this visit   There is currently no information documented on the homunculus. Go to the Rheumatology activity and complete the homunculus joint  exam.  Investigation: No additional findings.  Imaging: No results found.  Recent Labs: Lab Results  Component Value Date   WBC 7.9 12/13/2018   HGB 14.1 12/13/2018   PLT 294 12/13/2018   NA 140 12/13/2018   K 4.5 12/13/2018   CL 102 12/13/2018   CO2 29 12/13/2018   GLUCOSE 130 (H) 12/13/2018   BUN 13 12/13/2018   CREATININE 0.60 12/13/2018   BILITOT 0.4 12/13/2018   ALKPHOS 110 02/10/2017   AST 29 12/13/2018   ALT 19 12/13/2018   PROT 7.3 12/13/2018   ALBUMIN 4.4 02/10/2017   CALCIUM 9.3 12/13/2018   GFRAA 110 12/13/2018   QFTBGOLDPLUS NEGATIVE 05/31/2018    Speciality Comments: Last Reclast 11/30/2017   Procedures:  No procedures performed Allergies: Morphine, Morphine and related, Alphagan [brimonidine], Codeine, Dorzolamide hcl-timolol mal, Hydrocodone-acetaminophen, Sulfa antibiotics, and Sulfamethoxazole   Assessment / Plan:     Visit Diagnoses: Rheumatoid arthritis involving multiple sites with positive rheumatoid factor (HCC): She has no synovitis on exam.  She has not had any recent rheumatoid arthritis flares. She is clinically doing well on Humira 40 mg sq injections every 14 days and Arava 20 mg po daily.  She had a left eye corneal transplant on 04/17/19 but did not hold Humira or Arava before or after surgery. She has osteoarthritis changes in both hands but no inflammation or tenderness was noted.  She will continue on the current treatment regimen.  She does not need any refills and is up to date on lab work.  She was advised to notify us if she develops increased joint pain or joint swelling. She will follow up in 5 months.   High risk medication use - Cyclosporin 50 mg daily, Humira 40 mg every 14 days and Arava 10 mg 2 tablets daily.  Last TB gold negative on 01/30/19 and will monitor yearly.  Most recent CBC/CMP on 04/11/19.   ILD (interstitial lung disease) (HCC) -She has a follow up appointment with Dr. Sherene SiresWert scheduled on 06/22/19.  She has been  experiencing increased coughing recently but is not any increase shortness of breath.  She was advised to call Dr. Sherene SiresWert to make a sooner appointment if her symptoms persist or worsen.  Iritis of right eye -She is followed by Dr.Shah.  She is on Humira 40 mg subcutaneous injections every 14 days, which is  prescribed by Dr. Manuella Ghazi.   DDD (degenerative disc disease), lumbar: She has no lower back pain at this time.  No symptoms of radiculopathy.   Age-related osteoporosis without current pathological fracture -Most recent DEXA was on 10/08/2016 which revealed 1/3 left forearm T-score -3.5. Her last Reclast infusion was Nov 30, 2017. Updated DEXA ordered 12/13/18.  She was advised to call and schedule the bone density.  We discussed results once interpreted.  Trochanteric bursitis of both hips: She has tenderness over bilateral trochanteric bursa.  She has been experiencing nocturnal pain especially on the right side.  She was given handout of exercises to perform  Other medical conditions are listed as follows:   History of carotid endarterectomy  History of glaucoma  History of seizures  History of macular degeneration  History of migraine  Orders: No orders of the defined types were placed in this encounter.  No orders of the defined types were placed in this encounter.   Face-to-face time spent with patient was 30 minutes. Greater than 50% of time was spent in counseling and coordination of care.  Follow-Up Instructions: Return in about 5 months (around 10/15/2019) for Rheumatoid arthritis, ILD, iritis, Osteoporosis.   Ofilia Neas, PA-C   I examined and evaluated the patient with Hazel Sams PA.  Patient had no synovitis on examination today.  She has been doing well on current combination of medications.  I have advised her to follow-up with the pulmonologist.  Her eye symptoms have been improving as well.  The plan of care was discussed as noted above.  Bo Merino, MD   Note - This record has been created using Editor, commissioning.  Chart creation errors have been sought, but may not always  have been located. Such creation errors do not reflect on  the standard of medical care.

## 2019-05-04 ENCOUNTER — Other Ambulatory Visit: Payer: Self-pay | Admitting: Internal Medicine

## 2019-05-04 DIAGNOSIS — R059 Cough, unspecified: Secondary | ICD-10-CM

## 2019-05-04 DIAGNOSIS — R05 Cough: Secondary | ICD-10-CM

## 2019-05-04 MED ORDER — PANTOPRAZOLE SODIUM 40 MG PO TBEC: DELAYED_RELEASE_TABLET | ORAL | 1 refills | Status: DC

## 2019-05-08 ENCOUNTER — Telehealth: Payer: Self-pay | Admitting: *Deleted

## 2019-05-08 NOTE — Telephone Encounter (Signed)
Pt returned call. Please call back as soon as available.  °

## 2019-05-08 NOTE — Telephone Encounter (Signed)
-----   Message from Melvenia Beam, MD sent at 05/06/2019  7:19 PM EDT ----- Dilantin levels are slightly low. If she is still having symptoms I would increase her Dilantin to 350mg  a day.

## 2019-05-08 NOTE — Telephone Encounter (Signed)
Called pt & LVM asking for call back.  

## 2019-05-08 NOTE — Telephone Encounter (Signed)
Spoke with pt and discussed the Dilantin results and recommendations from Dr. Jaynee Eagles. Pt asked how she would increase her Dilantin to 350 mg. She takes 3 x 100 mg capsules. She also has been on brand only. I advised a message would be sent to Dr. Jaynee Eagles.

## 2019-05-09 NOTE — Telephone Encounter (Signed)
I spoke with the pt and discussed Dr. Cathren Laine message including all 3 options regarding the Dilantin (increase dose, watch and wait or take 100 mg TID). The pt doesn't want to risk having a seizure but she wants to make sure the lower Dilantin level isn't being caused by another medication she is on. She is concerned about why her levels are lower and what could be causing it. She said her meds haven't changed lately and stated this has never happened to her. She has been on Dilantin 300 mg since she was 21. She said if Dr. Jaynee Eagles doesn't think her meds are affecting the level then she would like to add the 50 mg chewable tablet. She also mentioned a dentist was following her for her gums. I told pt I would send the message to Dr. Jaynee Eagles to get her advice.

## 2019-05-09 NOTE — Telephone Encounter (Signed)
I believe there is a 50mg  chewable(please verify). Or we could watch and wait and see if she continues to have the symptoms she described. Alternatively she could take the 100mg  dilantin 3x a day and see if that provides better coverage. It shouldn't make a difference but sometimes it does thanks

## 2019-05-10 NOTE — Telephone Encounter (Signed)
I spoke with the patient and I informed her of Dr. Cathren Laine entire message from today. Patient understands and will keep in touch with [place]. She verbalized appreciation for the call.

## 2019-05-10 NOTE — Telephone Encounter (Signed)
I am not sure why she is having more jerking. She has been on this dose of dilantin for a very long time. Unfortunately I don';t have an old level to check and see if this is stable for her. We can watch and wait. If she continues to jerk and she is concerned then we can add a small dose of dilantin on(50mg ). Thank you for the drug interactions,  I would ask her to watch her use of th ecalcium carbonate.  I don;t think the other meds will make a significant difference in dilantin levels because she takes them infrequently( rimegepant, fioricet, promethazine) and the other meds are for her eyes (cyclosporine, steroids). And I feel the benefits of atorvastatin and asa outweigh the minimal risks of decreased levels of dilantin. Thanks

## 2019-05-16 ENCOUNTER — Encounter: Payer: Self-pay | Admitting: Physical Therapy

## 2019-05-16 ENCOUNTER — Other Ambulatory Visit: Payer: Self-pay

## 2019-05-16 ENCOUNTER — Ambulatory Visit: Payer: Medicare Other | Attending: Neurology | Admitting: Physical Therapy

## 2019-05-16 DIAGNOSIS — R293 Abnormal posture: Secondary | ICD-10-CM | POA: Diagnosis present

## 2019-05-16 DIAGNOSIS — M6281 Muscle weakness (generalized): Secondary | ICD-10-CM | POA: Insufficient documentation

## 2019-05-16 DIAGNOSIS — G8929 Other chronic pain: Secondary | ICD-10-CM | POA: Insufficient documentation

## 2019-05-16 DIAGNOSIS — M542 Cervicalgia: Secondary | ICD-10-CM | POA: Diagnosis present

## 2019-05-16 DIAGNOSIS — R519 Headache, unspecified: Secondary | ICD-10-CM | POA: Insufficient documentation

## 2019-05-16 NOTE — Therapy (Signed)
[facility] [address] [place], [place], [address] Phone: [phone]   Fax:  [phone]  Physical Therapy Treatment / Re-evaluation  Patient Details  Name: Brianna [name] MRN: [id number] Date of Birth: 1950/11/07 Referring Provider (PT): Anson Fret, MD    Encounter Date: 05/16/2019  PT End of Session - 05/16/19 0810    Visit Number  4    Number of Visits  13    Date for PT Re-Evaluation  06/20/19    Authorization Type  MCR: Kx mod by 15th visit, progress note at 10th visit    PT Start Time  0802    PT Stop Time  0844    PT Time Calculation (min)  42 min    Activity Tolerance  Patient tolerated treatment well    Behavior During Therapy  [facility] for tasks assessed/performed       Past Medical History:  Diagnosis Date  . Arthritis    in the neck  . Carotid artery occlusion   . Epilepsy (HCC)   . GERD (gastroesophageal reflux disease)   . Glaucoma   . Hyperlipidemia   . Hypertension   . Keratoconus of both eyes 1981  . Macular degeneration   . Migraines   . Neuromuscular disorder (HCC)    RA  . Peripheral vascular disease (HCC)    carotid occlusion surgery on left  . Raynaud's disease   . Retinal edema   . Rheumatoid arthritis(714.0)   . Stroke [facility])    two mild strokes presumed from left carotid stenosis    Past Surgical History:  Procedure Laterality Date  . CAROTID ARTERY ANGIOPLASTY  2008   Dr Madilyn Fireman  . CAROTID ENDARTERECTOMY Left 11-15-2007  . cataract extraction Right 04-2014   [facility]  . CHOLECYSTECTOMY  1995  . CORNEAL TRANSPLANT     x 5 ; steroid inj. retnal information  . CORNEAL TRANSPLANT Right 01-22-2014   [facility]  . EYE SURGERY Right 12/2016   cornea repair   . EYE SURGERY Right 09/20/2017   cornea transplant   . GLAUCOMA SURGERY Right 2018  . TRIGGER FINGER RELEASE Right 05/16/2018   Procedure: RELEASE TRIGGER FINGER/A-1 PULLEY RIGHT THUMB;  Surgeon: Cindee Salt, MD;   Location: [facility];  Service: Orthopedics;  Laterality: Right;    There were no vitals filed for this visit.  Subjective Assessment - 05/16/19 0806    Subjective  pt returns after having her cornea transplant and reports she has had some increased tightness in the neck and reoccurrence of the migraine which she did get a botox injection last Thursday which she reports it does help, in combination with medication. Currently has a small HA reporting its typically on the L side.    Patient Stated Goals  increaes neck ROM, decrase pain, HA severity    Currently in Pain?  Yes    Pain Score  5     Pain Location  Neck    Pain Orientation  Left    Pain Descriptors / Indicators  Aching    Pain Type  Chronic pain    Pain Onset  More than a month ago    Pain Frequency  Intermittent    Aggravating Factors   sunlight (increased photo sensitivty         [facility] PT Assessment - 05/16/19 0001      Assessment   Medical Diagnosis  Intractable chronic migraine without aura and without status migrainosus ,  Cervical myofascial pain syndrome     Referring Provider (PT)  Anson Fret, MD     Hand Dominance  Right      AROM   Cervical Flexion  42    Cervical Extension  38    Cervical - Right Side Bend  48    Cervical - Left Side Bend  40    Cervical - Right Rotation  52    Cervical - Left Rotation  51      Palpation   Palpation comment  TTP along L upper trap/ levator scapulae with multiple trigger points, sub-occipitals and splenus capitius                   [facility] Adult PT Treatment/Exercise - 05/16/19 8882      Manual Therapy   Manual therapy comments  skilled palpation and monitoring of pt throughout TPDN    Soft tissue mobilization  IASTM along R upper trap, levator scapulae and sub-occipitals    Manual Traction  gentle manualtraction and sub-occipital release       Trigger Point Dry Needling - 05/16/19 0001    Consent Given?  Yes    Education Handout  Provided  Previously provided    Other Dry Needling  sub-occipitals bil    Upper Trapezius Response  Twitch reponse elicited;Palpable increased muscle length   bil          PT Education - 05/16/19 0851    Education Details  reviewed previous HEP and updated POC    Person(s) Educated  Patient    Methods  Explanation;Verbal cues;Handout    Comprehension  Verbalized understanding;Verbal cues required       PT Short Term Goals - 05/16/19 0818      PT SHORT TERM GOAL #1   Title  pt to be I with initial HEP    Period  Weeks    Status  Achieved        PT Long Term Goals - 05/16/19 0818      PT LONG TERM GOAL #1   Title  pt to demo proper posture and lifting mechancis to reduce and prevent neck/ shoulder pain     Period  Weeks    Status  On-going      PT LONG TERM GOAL #2   Title  report decreased spasm inthe upper traps and surrounding musculature to reduce pain to </= 2/10 and maintain cerivcal mobility    Baseline  5/10    Time  6    Period  Weeks    Status  On-going      PT LONG TERM GOAL #3   Title  pt to return to the gym / home rountine and report decreased HA occurence by >/= 1 week for pt goals and promote QOL    Baseline  increased occurence of migraine    Period  Weeks    Status  On-going      PT LONG TERM GOAL #4   Title  pt to be I with all HEP given as of last visit to maintain and progress current level of function    Period  Weeks    Status  On-going            Plan - 05/16/19 0827    Clinical Impression Statement  pt returns back to PT following her cornea transplant. She reports increased HA / migraine which she relates is from having to stay in one position for a few weeks  to allow for her eyes to heal. She demonstrates some regression in cervical ROM, She is working toward her LTG's. Continued TPDN today focusing on bil sub-occipital/ upper trap followed with IASTM techniques. plan to conitnue with current PT POC addressing cervical tightness,  promote ROM, and reduce HA incidence and work toward remaining goals.    PT Frequency  2x / week    PT Duration  4 weeks    PT Treatment/Interventions  ADLs/Self Care Home Management;Cryotherapy;Electrical Stimulation;Iontophoresis 4mg /ml Dexamethasone;Moist Heat;Ultrasound;Therapeutic activities;Therapeutic exercise;Dry needling;Taping;Manual techniques;Patient/family education;Balance training;Neuromuscular re-education;Passive range of motion    PT Next Visit Plan  , Review HEP, response to TPDN posterior cervical/ hsoulder structures. STW , shoulder strengthening, posture training, sleeping positioning,    PT Home Exercise Plan  upper trap stretching, levator scapulae, chin tucks, scapular retraction, cervical stabilization    Consulted and Agree with Plan of Care  Patient       Patient will benefit from skilled therapeutic intervention in order to improve the following deficits and impairments:  Improper body mechanics, Decreased strength, Decreased knowledge of precautions, Decreased balance, Pain, Decreased endurance, Impaired vision/preception, Increased fascial restricitons, Decreased activity tolerance, Postural dysfunction  Visit Diagnosis: Chronic nonintractable headache, unspecified headache type  Abnormal posture  Cervicalgia  Muscle weakness (generalized)     Problem List Patient Active Problem List   Diagnosis Date Noted  . URI, acute 05/19/2018  . Cerumen impaction 05/19/2018  . Trigger thumb, right thumb 10/05/2017  . History of macular degeneration 09/13/2016  . History of migraine 09/06/2016  . History of seizures 09/06/2016  . Iritis of right eye 05/12/2016  . Rheumatoid arthritis with positive rheumatoid factor ([place]) 05/11/2016  . 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  . High risk medication use 05/11/2016  . Osteoporosis 05/11/2016  . 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 History of carotid  endarterectomy 06/27/2014  . Migraine without aura 04/19/2013  . Seizures ([place]) 04/19/2013  . 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 Cough 01/14/2011  . Pulmonary fibrosis ([place]) 01/14/2011    Starr Lake PT, DPT, LAT, ATC  05/16/19  8:52 AM      [facility] [address] [place], [place], [address] Phone: [phone]   Fax:  [phone]  Name: AMEYA VOWELL MRN: [id number] Date of Birth: 1951-05-14

## 2019-05-17 ENCOUNTER — Encounter: Payer: Self-pay | Admitting: Rheumatology

## 2019-05-17 ENCOUNTER — Ambulatory Visit (INDEPENDENT_AMBULATORY_CARE_PROVIDER_SITE_OTHER): Payer: Medicare Other | Admitting: Rheumatology

## 2019-05-17 VITALS — BP 189/103 | HR 83 | Resp 12 | Ht 63.0 in | Wt 128.0 lb

## 2019-05-17 DIAGNOSIS — M0579 Rheumatoid arthritis with rheumatoid factor of multiple sites without organ or systems involvement: Secondary | ICD-10-CM | POA: Diagnosis not present

## 2019-05-17 DIAGNOSIS — J849 Interstitial pulmonary disease, unspecified: Secondary | ICD-10-CM | POA: Diagnosis not present

## 2019-05-17 DIAGNOSIS — M810 Age-related osteoporosis without current pathological fracture: Secondary | ICD-10-CM

## 2019-05-17 DIAGNOSIS — M7061 Trochanteric bursitis, right hip: Secondary | ICD-10-CM

## 2019-05-17 DIAGNOSIS — H209 Unspecified iridocyclitis: Secondary | ICD-10-CM

## 2019-05-17 DIAGNOSIS — Z87898 Personal history of other specified conditions: Secondary | ICD-10-CM

## 2019-05-17 DIAGNOSIS — M7062 Trochanteric bursitis, left hip: Secondary | ICD-10-CM

## 2019-05-17 DIAGNOSIS — Z79899 Other long term (current) drug therapy: Secondary | ICD-10-CM

## 2019-05-17 DIAGNOSIS — Z9889 Other specified postprocedural states: Secondary | ICD-10-CM

## 2019-05-17 DIAGNOSIS — M5136 Other intervertebral disc degeneration, lumbar region: Secondary | ICD-10-CM

## 2019-05-17 DIAGNOSIS — Z8669 Personal history of other diseases of the nervous system and sense organs: Secondary | ICD-10-CM

## 2019-05-17 NOTE — Patient Instructions (Signed)
Iliotibial Band Syndrome Rehab Ask your health care provider which exercises are safe for you. Do exercises exactly as told by your health care provider and adjust them as directed. It is normal to feel mild stretching, pulling, tightness, or discomfort as you do these exercises. Stop right away if you feel sudden pain or your pain gets significantly worse. Do not begin these exercises until told by your health care provider. Stretching and range-of-motion exercises These exercises warm up your muscles and joints and improve the movement and flexibility of your hip and pelvis. Quadriceps stretch, prone  1. Lie on your abdomen on a firm surface, such as a bed or padded floor (prone position). 2. Bend your left / right knee and reach back to hold your ankle or pant leg. If you cannot reach your ankle or pant leg, loop a belt around your foot and grab the belt instead. 3. Gently pull your heel toward your buttocks. Your knee should not slide out to the side. You should feel a stretch in the front of your thigh and knee (quadriceps). 4. Hold this position for __________ seconds. Repeat __________ times. Complete this exercise __________ times a day. Iliotibial band stretch An iliotibial band is a strong band of muscle tissue that runs from the outer side of your hip to the outer side of your thigh and knee. 1. Lie on your side with your left / right leg in the top position. 2. Bend both of your knees and grab your left / right ankle. Stretch out your bottom arm to help you balance. 3. Slowly bring your top knee back so your thigh goes behind your trunk. 4. Slowly lower your top leg toward the floor until you feel a gentle stretch on the outside of your left / right hip and thigh. If you do not feel a stretch and your knee will not fall farther, place the heel of your other foot on top of your knee and pull your knee down toward the floor with your foot. 5. Hold this position for __________ seconds.  Repeat __________ times. Complete this exercise __________ times a day. Strengthening exercises These exercises build strength and endurance in your hip and pelvis. Endurance is the ability to use your muscles for a long time, even after they get tired. Straight leg raises, side-lying This exercise strengthens the muscles that rotate the leg at the hip and move it away from your body (hip abductors). 1. Lie on your side with your left / right leg in the top position. Lie so your head, shoulder, hip, and knee line up. You may bend your bottom knee to help you balance. 2. Roll your hips slightly forward so your hips are stacked directly over each other and your left / right knee is facing forward. 3. Tense the muscles in your outer thigh and lift your top leg 4-6 inches (10-15 cm). 4. Hold this position for __________ seconds. 5. Slowly return to the starting position. Let your muscles relax completely before doing another repetition. Repeat __________ times. Complete this exercise __________ times a day. Leg raises, prone This exercise strengthens the muscles that move the hips (hip extensors). 1. Lie on your abdomen on your bed or a firm surface. You can put a pillow under your hips if that is more comfortable for your lower back. 2. Bend your left / right knee so your foot is straight up in the air. 3. Squeeze your buttocks muscles and lift your left / right thigh   off the bed. Do not let your back arch. 4. Tense your thigh muscle as hard as you can without increasing any knee pain. 5. Hold this position for __________ seconds. 6. Slowly lower your leg to the starting position and allow it to relax completely. Repeat __________ times. Complete this exercise __________ times a day. Hip hike 1. Stand sideways on a bottom step. Stand on your left / right leg with your other foot unsupported next to the step. You can hold on to the railing or wall for balance if needed. 2. Keep your knees straight  and your torso square. Then lift your left / right hip up toward the ceiling. 3. Slowly let your left / right hip lower toward the floor, past the starting position. Your foot should get closer to the floor. Do not lean or bend your knees. Repeat __________ times. Complete this exercise __________ times a day. This information is not intended to replace advice given to you by your health care provider. Make sure you discuss any questions you have with your health care provider. Document Released: 07/05/2005 Document Revised: 10/26/2018 Document Reviewed: 04/26/2018 Elsevier Patient Education  2020 [employer] Hip Bursitis Rehab Ask your health care provider which exercises are safe for you. Do exercises exactly as told by your health care provider and adjust them as directed. It is normal to feel mild stretching, pulling, tightness, or discomfort as you do these exercises. Stop right away if you feel sudden pain or your pain gets worse. Do not begin these exercises until told by your health care provider. Stretching exercise This exercise warms up your muscles and joints and improves the movement and flexibility of your hip. This exercise also helps to relieve pain and stiffness. Iliotibial band stretch An iliotibial band is a strong band of muscle tissue that runs from the outer side of your hip to the outer side of your thigh and knee. 1. Lie on your side with your left / right leg in the top position. 2. Bend your left / right knee and grab your ankle. Stretch out your bottom arm to help you balance. 3. Slowly bring your knee back so your thigh is behind your body. 4. Slowly lower your knee toward the floor until you feel a gentle stretch on the outside of your left / right thigh. If you do not feel a stretch and your knee will not fall farther, place the heel of your other foot on top of your knee and pull your knee down toward the floor with your foot. 5. Hold this position for __________  seconds. 6. Slowly return to the starting position. Repeat __________ times. Complete this exercise __________ times a day. Strengthening exercises These exercises build strength and endurance in your hip and pelvis. Endurance is the ability to use your muscles for a long time, even after they get tired. Bridge This exercise strengthens the muscles that move your thigh backward (hip extensors). 1. Lie on your back on a firm surface with your knees bent and your feet flat on the floor. 2. Tighten your buttocks muscles and lift your buttocks off the floor until your trunk is level with your thighs. ? Do not arch your back. ? You should feel the muscles working in your buttocks and the back of your thighs. If you do not feel these muscles, slide your feet 1-2 inches (2.5-5 cm) farther away from your buttocks. ? If this exercise is too easy, try doing it with your arms   crossed over your chest. 3. Hold this position for __________ seconds. 4. Slowly lower your hips to the starting position. 5. Let your muscles relax completely after each repetition. Repeat __________ times. Complete this exercise __________ times a day. Squats This exercise strengthens the muscles in front of your thigh and knee (quadriceps). 1. Stand in front of a table, with your feet and knees pointing straight ahead. You may rest your hands on the table for balance but not for support. 2. Slowly bend your knees and lower your hips like you are going to sit in a chair. ? Keep your weight over your heels, not over your toes. ? Keep your lower legs upright so they are parallel with the table legs. ? Do not let your hips go lower than your knees. ? Do not bend lower than told by your health care provider. ? If your hip pain increases, do not bend as low. 3. Hold the squat position for __________ seconds. 4. Slowly push with your legs to return to standing. Do not use your hands to pull yourself to standing. Repeat __________  times. Complete this exercise __________ times a day. Hip hike 1. Stand sideways on a bottom step. Stand on your left / right leg with your other foot unsupported next to the step. You can hold on to the railing or wall for balance if needed. 2. Keep your knees straight and your torso square. Then lift your left / right hip up toward the ceiling. 3. Hold this position for __________ seconds. 4. Slowly let your left / right hip lower toward the floor, past the starting position. Your foot should get closer to the floor. Do not lean or bend your knees. Repeat __________ times. Complete this exercise __________ times a day. Single leg stand 1. Without shoes, stand near a railing or in a doorway. You may hold on to the railing or door frame as needed for balance. 2. Squeeze your left / right buttock muscles, then lift up your other foot. ? Do not let your left / right hip push out to the side. ? It is helpful to stand in front of a mirror for this exercise so you can watch your hip. 3. Hold this position for __________ seconds. Repeat __________ times. Complete this exercise __________ times a day. This information is not intended to replace advice given to you by your health care provider. Make sure you discuss any questions you have with your health care provider. Document Released: 08/12/2004 Document Revised: 10/30/2018 Document Reviewed: 10/30/2018 Elsevier Patient Education  2020 [employer] Inc[employer]  

## 2019-05-18 ENCOUNTER — Encounter: Payer: Self-pay | Admitting: Physical Therapy

## 2019-05-18 ENCOUNTER — Ambulatory Visit: Payer: Medicare Other | Admitting: Physical Therapy

## 2019-05-18 ENCOUNTER — Other Ambulatory Visit: Payer: Self-pay

## 2019-05-18 DIAGNOSIS — M6281 Muscle weakness (generalized): Secondary | ICD-10-CM

## 2019-05-18 DIAGNOSIS — R519 Headache, unspecified: Secondary | ICD-10-CM | POA: Diagnosis not present

## 2019-05-18 DIAGNOSIS — M542 Cervicalgia: Secondary | ICD-10-CM

## 2019-05-18 DIAGNOSIS — G8929 Other chronic pain: Secondary | ICD-10-CM

## 2019-05-18 DIAGNOSIS — R293 Abnormal posture: Secondary | ICD-10-CM

## 2019-05-18 NOTE — Therapy (Signed)
[facility] [address] [place], [place], [address] Phone: [phone]   Fax:  [phone]  Physical Therapy Treatment  Patient Details  Name: Brianna [name] MRN: [id number] Date of Birth: 12-06-1950 Referring Provider (PT): Anson Fret, MD    Encounter Date: 05/18/2019  PT End of Session - 05/18/19 0932    Visit Number  5    Number of Visits  13    Date for PT Re-Evaluation  06/20/19    Authorization Type  MCR: Kx mod by 15th visit, progress note at 10th visit    PT Start Time  0800    PT Stop Time  0844    PT Time Calculation (min)  44 min    Activity Tolerance  Patient tolerated treatment well    Behavior During Therapy  [facility] for tasks assessed/performed       Past Medical History:  Diagnosis Date  . Arthritis    in the neck  . Carotid artery occlusion   . Epilepsy (HCC)   . GERD (gastroesophageal reflux disease)   . Glaucoma   . Hyperlipidemia   . Hypertension   . Keratoconus of both eyes 1981  . Macular degeneration   . Migraines   . Neuromuscular disorder (HCC)    RA  . Peripheral vascular disease (HCC)    carotid occlusion surgery on left  . Raynaud's disease   . Retinal edema   . Rheumatoid arthritis(714.0)   . Stroke [facility])    two mild strokes presumed from left carotid stenosis    Past Surgical History:  Procedure Laterality Date  . CAROTID ARTERY ANGIOPLASTY  2008   Dr Madilyn Fireman  . CAROTID ENDARTERECTOMY Left 11-15-2007  . cataract extraction Right 04-2014   [facility]  . CHOLECYSTECTOMY  1995  . CORNEAL TRANSPLANT     x 5 ; steroid inj. retnal information  . CORNEAL TRANSPLANT Right 01-22-2014   [facility]  . 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 EYE SURGERY Right 12/2016   cornea repair   . EYE SURGERY Right 09/20/2017   cornea transplant   . GLAUCOMA SURGERY Right 2018  . TRIGGER FINGER RELEASE Right 05/16/2018   Procedure: RELEASE TRIGGER FINGER/A-1 PULLEY RIGHT THUMB;  Surgeon:  Cindee Salt, MD;  Location: [facility];  Service: Orthopedics;  Laterality: Right;    There were no vitals filed for this visit.  Subjective Assessment - 05/18/19 0805    Subjective  Patient reported minor sorenes after the needling but overall she is doing well. She has minor pain on the right side today.    Limitations  Standing    How long can you sit comfortably?  unlimited    How long can you stand comfortably?  unlimited    How long can you walk comfortably?  unlimited    Diagnostic tests  N/A    Patient Stated Goals  increaes neck ROM, decrase pain, HA severity    Currently in Pain?  Yes    Pain Score  2     Pain Location  Neck    Pain Orientation  Left    Pain Descriptors / Indicators  Aching    Pain Type  Chronic pain    Pain Onset  More than a month ago    Pain Frequency  Intermittent    Pain Relieving Factors  stretches    Effect of Pain on Daily Activities  headaches  [place] Adult PT Treatment/Exercise - 05/18/19 0001      Neck Exercises: Standing   Other Standing Exercises  scap retraction 2x10 red; shoulder extension 2x10 red       Neck Exercises: Seated   Other Seated Exercise  red 2x10 bilateral ER ; horizontal abdution 2x10 red;       Manual Therapy   Manual therapy comments  skilled palpation and monitoring of pt throughout TPDN    Soft tissue mobilization  IASTM along R upper trap, levator scapulae and sub-occipitals    Manual Traction  gentle manualtraction and sub-occipital release      Neck Exercises: Stretches   Upper Trapezius Stretch  20 seconds;2 reps    Levator Stretch  2 reps;20 seconds       Trigger Point Dry Needling - 05/18/19 0001    Consent Given?  Yes    Education Handout Provided  Previously provided    Other Dry Needling  upper trap not needled 2nd to no real trigger points felt     Upper Trapezius Response  --   bil   Cervical multifidi Response  Twitch reponse elicited            PT Education - 05/18/19 0932    Education Details  reviewed HEp and symptom mangement    Person(s) Educated  Patient    Methods  Explanation;Demonstration;Verbal cues;Tactile cues    Comprehension  Verbalized understanding;Returned demonstration;Verbal cues required;Tactile cues required       PT Short Term Goals - 05/16/19 0818      PT SHORT TERM GOAL #1   Title  pt to be I with initial HEP    Period  Weeks    Status  Achieved        PT Long Term Goals - 05/16/19 0818      PT LONG TERM GOAL #1   Title  pt to demo proper posture and lifting mechancis to reduce and prevent neck/ shoulder pain     Period  Weeks    Status  On-going      PT LONG TERM GOAL #2   Title  report decreased spasm inthe upper traps and surrounding musculature to reduce pain to </= 2/10 and maintain cerivcal mobility    Baseline  5/10    Time  6    Period  Weeks    Status  On-going      PT LONG TERM GOAL #3   Title  pt to return to the gym / home rountine and report decreased HA occurence by >/= 1 week for pt goals and promote QOL    Baseline  increased occurence of migraine    Period  Weeks    Status  On-going      PT LONG TERM GOAL #4   Title  pt to be I with all HEP given as of last visit to maintain and progress current level of function    Period  Weeks    Status  On-going            Plan - 05/18/19 0933    Clinical Impression Statement  Patient tolerated treatment well. Therapy had her begin her exercises agian. She had no increase inpain. Therapy needled 1 spot in her cervical paraspinals but could find no other trigger points. The patient is making good progress.    Personal Factors and Comorbidities  Age;Comorbidity 2    Comorbidities  hx or HA, cervicaliga, osteoporosis    Examination-Activity Limitations  Lift  Stability/Clinical Decision Making  Evolving/Moderate complexity    Clinical Decision Making  Moderate    Rehab Potential  Good    PT Frequency  2x /  week    PT Duration  4 weeks    PT Treatment/Interventions  ADLs/Self Care Home Management;Cryotherapy;Electrical Stimulation;Iontophoresis 4mg /ml Dexamethasone;Moist Heat;Ultrasound;Therapeutic activities;Therapeutic exercise;Dry needling;Taping;Manual techniques;Patient/family education;Balance training;Neuromuscular re-education;Passive range of motion    PT Next Visit Plan  , Review HEP, response to TPDN posterior cervical/ hsoulder structures. STW , shoulder strengthening, posture training, sleeping positioning,    PT Home Exercise Plan  upper trap stretching, levator scapulae, chin tucks, scapular retraction, cervical stabilization    Consulted and Agree with Plan of Care  Patient       Patient will benefit from skilled therapeutic intervention in order to improve the following deficits and impairments:  Improper body mechanics, Decreased strength, Decreased knowledge of precautions, Decreased balance, Pain, Decreased endurance, Impaired vision/preception, Increased fascial restricitons, Decreased activity tolerance, Postural dysfunction  Visit Diagnosis: Chronic nonintractable headache, unspecified headache type  Abnormal posture  Cervicalgia  Muscle weakness (generalized)     Problem List Patient Active Problem List   Diagnosis Date Noted  . URI, acute 05/19/2018  . Cerumen impaction 05/19/2018  . Trigger thumb, right thumb 10/05/2017  . History of macular degeneration 09/13/2016  . History of migraine 09/06/2016  . History of seizures 09/06/2016  . Iritis of right eye 05/12/2016  . Rheumatoid arthritis with positive rheumatoid factor (HCC) 05/11/2016  . 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  . High risk medication use 05/11/2016  . Osteoporosis 05/11/2016  . 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 History of carotid endarterectomy 06/27/2014  . Migraine without aura 04/19/2013  . Seizures (HCC) 04/19/2013  .  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 Cough 01/14/2011  . Pulmonary fibrosis (HCC) 01/14/2011     Dessie Coma  PT DPT  05/18/2019, 9:40 AM  [facility] [address] [place], [place], [address] Phone: [phone]   Fax:  [phone]  Name: KATHY ZEPEDA MRN: [id number] Date of Birth: [birth year]

## 2019-05-23 ENCOUNTER — Other Ambulatory Visit: Payer: Self-pay

## 2019-05-23 ENCOUNTER — Ambulatory Visit: Payer: Medicare Other | Attending: Neurology | Admitting: Physical Therapy

## 2019-05-23 ENCOUNTER — Encounter: Payer: Self-pay | Admitting: Physical Therapy

## 2019-05-23 DIAGNOSIS — G8929 Other chronic pain: Secondary | ICD-10-CM

## 2019-05-23 DIAGNOSIS — R519 Headache, unspecified: Secondary | ICD-10-CM | POA: Diagnosis present

## 2019-05-23 DIAGNOSIS — R293 Abnormal posture: Secondary | ICD-10-CM | POA: Diagnosis present

## 2019-05-23 DIAGNOSIS — M6281 Muscle weakness (generalized): Secondary | ICD-10-CM | POA: Diagnosis present

## 2019-05-23 DIAGNOSIS — M542 Cervicalgia: Secondary | ICD-10-CM

## 2019-05-23 NOTE — Therapy (Signed)
[facility] Outpatient Rehabilitation [facility] [address] [place], [place], [address] Phone: [phone]   Fax:  [phone]  Physical Therapy Treatment  Patient Details  Name: Brianna [name] MRN: [id number] Date of Birth: [birth year] Referring Provider (PT): Anson Fret, MD    Encounter Date: 05/23/2019  PT End of Session - 05/23/19 0809    Visit Number  6    Number of Visits  13    Date for PT Re-Evaluation  06/20/19    Authorization Type  MCR: Kx mod by 15th visit, progress note at 10th visit    PT Start Time  0806   pt arrived 6 min late   PT Stop Time  0855    PT Time Calculation (min)  49 min       Past Medical History:  Diagnosis Date  . Arthritis    in the neck  . Carotid artery occlusion   . Epilepsy (HCC)   . GERD (gastroesophageal reflux disease)   . Glaucoma   . Hyperlipidemia   . Hypertension   . Keratoconus of both eyes 1981  . Macular degeneration   . Migraines   . Neuromuscular disorder (HCC)    RA  . Peripheral vascular disease (HCC)    carotid occlusion surgery on left  . Raynaud's disease   . Retinal edema   . Rheumatoid arthritis(714.0)   . Stroke [facility])    two mild strokes presumed from left carotid stenosis    Past Surgical History:  Procedure Laterality Date  . CAROTID ARTERY ANGIOPLASTY  2008   Dr Madilyn Fireman  . CAROTID ENDARTERECTOMY Left 11-15-2007  . cataract extraction Right 04-2014   [facility]  . CHOLECYSTECTOMY  1995  . CORNEAL TRANSPLANT     x 5 ; steroid inj. retnal information  . CORNEAL TRANSPLANT Right 01-22-2014   [facility]  . 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 EYE SURGERY Right 12/2016   cornea repair   . EYE SURGERY Right 09/20/2017   cornea transplant   . GLAUCOMA SURGERY Right 2018  . TRIGGER FINGER RELEASE Right 05/16/2018   Procedure: RELEASE TRIGGER FINGER/A-1 PULLEY RIGHT THUMB;  Surgeon: Cindee Salt, MD;  Location: [facility];  Service: Orthopedics;  Laterality:  Right;    There were no vitals filed for this visit.  Subjective Assessment - 05/23/19 0807    Subjective  " I am doing good, still some sorness and tightness mostly on the L side of my neck.  I am not sure if its from my eye or just tightness in the neck"    Patient Stated Goals  increaes neck ROM, decrase pain, HA severity    Currently in Pain?  Yes    Pain Score  5     Pain Location  Neck    Pain Orientation  Left    Pain Descriptors / Indicators  Aching    Pain Type  Chronic pain    Pain Onset  More than a month ago    Pain Frequency  Intermittent    Aggravating Factors   sunlight    Pain Relieving Factors  stretches         [facility] PT Assessment - 05/23/19 0001      Assessment   Medical Diagnosis  Intractable chronic migraine without aura and without status migrainosus ,  Cervical myofascial pain syndrome     Referring Provider (PT)  Anson Fret, MD  [facility] Adult PT Treatment/Exercise - 05/23/19 0001      Neck Exercises: Machines for Strengthening   UBE (Upper Arm Bike)  L2 x 4 min   changing direction at 2 min     Neck Exercises: Supine   Cervical Isometrics  Extension;Right lateral flexion;Left lateral flexion;Left rotation;Right rotation;5 secs;10 reps      Shoulder Exercises: Supine   Other Supine Exercises  laying with spine parallel to spine: serratus punch, horizontal abd/ adduction, alternating ceiling punches, back stroke      Modalities   Modalities  Moist Heat      Moist Heat Therapy   Number Minutes Moist Heat  10 Minutes    Moist Heat Location  Cervical   pt in supine     Manual Therapy   Manual Therapy  Joint mobilization    Manual therapy comments  skilled palpation and monitoring of pt throughout TPDN    Joint Mobilization  C3-C7 PA grade III    Soft tissue mobilization  IASTM along R upper trap, levator scapulae and sub-occipitals      Neck Exercises: Stretches   Upper Trapezius Stretch  20 seconds;2 reps     Levator Stretch  2 reps;20 seconds       Trigger Point Dry Needling - 05/23/19 0001    Consent Given?  Yes    Education Handout Provided  Previously provided    Upper Trapezius Response  Twitch reponse elicited;Palpable increased muscle length    Levator Scapulae Response  Twitch response elicited;Palpable increased muscle length    Cervical multifidi Response  Twitch reponse elicited;Palpable increased muscle length   bil            PT Short Term Goals - 05/16/19 0818      PT SHORT TERM GOAL #1   Title  pt to be I with initial HEP    Period  Weeks    Status  Achieved        PT Long Term Goals - 05/16/19 0818      PT LONG TERM GOAL #1   Title  pt to demo proper posture and lifting mechancis to reduce and prevent neck/ shoulder pain     Period  Weeks    Status  On-going      PT LONG TERM GOAL #2   Title  report decreased spasm inthe upper traps and surrounding musculature to reduce pain to </= 2/10 and maintain cerivcal mobility    Baseline  5/10    Time  6    Period  Weeks    Status  On-going      PT LONG TERM GOAL #3   Title  pt to return to the gym / home rountine and report decreased HA occurence by >/= 1 week for pt goals and promote QOL    Baseline  increased occurence of migraine    Period  Weeks    Status  On-going      PT LONG TERM GOAL #4   Title  pt to be I with all HEP given as of last visit to maintain and progress current level of function    Period  Weeks    Status  On-going            Plan - 05/23/19 0827    Clinical Impression Statement  pt presents with increased soreness in the L upper trap and cervical paraspinals. continued TPDN for bil upper trap/ cervical parapsinals followed with IASTM techniques and cervical mobs. continue  dstretch and scapular and cervical stability exercises which she perform well. utlized MHP end of session to calm down soreness from DN.    PT Next Visit Plan  , Review HEP, response to TPDN posterior  cervical/ hsoulder structures. STW , shoulder strengthening, posture training, sleeping positioning,update HEP for foam roll routine    PT Home Exercise Plan  upper trap stretching, levator scapulae, chin tucks, scapular retraction, cervical stabilization       Patient will benefit from skilled therapeutic intervention in order to improve the following deficits and impairments:  Improper body mechanics, Decreased strength, Decreased knowledge of precautions, Decreased balance, Pain, Decreased endurance, Impaired vision/preception, Increased fascial restricitons, Decreased activity tolerance, Postural dysfunction  Visit Diagnosis: Chronic nonintractable headache, unspecified headache type  Abnormal posture  Cervicalgia  Muscle weakness (generalized)     Problem List Patient Active Problem List   Diagnosis Date Noted  . URI, acute 05/19/2018  . Cerumen impaction 05/19/2018  . Trigger thumb, right thumb 10/05/2017  . History of macular degeneration 09/13/2016  . History of migraine 09/06/2016  . History of seizures 09/06/2016  . Iritis of right eye 05/12/2016  . Rheumatoid arthritis with positive rheumatoid factor (HCC) 05/11/2016  . 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  . High risk medication use 05/11/2016  . Osteoporosis 05/11/2016  . 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 History of carotid endarterectomy 06/27/2014  . Migraine without aura 04/19/2013  . Seizures (HCC) 04/19/2013  . 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 Cough 01/14/2011  . Pulmonary fibrosis (HCC) 01/14/2011   Lulu Riding PT, DPT, LAT, ATC  05/23/19  8:43 AM      [facility] Out[facility] Rehabilitation [facility] [address] [place], [place], [address] Phone: [phone]   Fax:  [phone]  Name: Brianna [name] MRN: [id number] Date of Birth: [birth year]

## 2019-05-25 ENCOUNTER — Encounter: Payer: Self-pay | Admitting: Physical Therapy

## 2019-05-25 ENCOUNTER — Ambulatory Visit: Payer: Medicare Other | Admitting: Physical Therapy

## 2019-05-25 ENCOUNTER — Other Ambulatory Visit: Payer: Self-pay

## 2019-05-25 DIAGNOSIS — R293 Abnormal posture: Secondary | ICD-10-CM

## 2019-05-25 DIAGNOSIS — R519 Headache, unspecified: Secondary | ICD-10-CM

## 2019-05-25 DIAGNOSIS — M542 Cervicalgia: Secondary | ICD-10-CM

## 2019-05-25 DIAGNOSIS — G8929 Other chronic pain: Secondary | ICD-10-CM

## 2019-05-25 DIAGNOSIS — M6281 Muscle weakness (generalized): Secondary | ICD-10-CM

## 2019-05-25 NOTE — Therapy (Signed)
[facility] [address] [place], [place], [address] Phone: [phone]   Fax:  [phone]  Physical Therapy Treatment  Patient Details  Name: Brianna [name] MRN: [id number] Date of Birth: [birth year] Referring Provider (PT): Anson FretAhern, Antonia B, MD    Encounter Date: 05/25/2019  PT End of Session - 05/25/19 0757    Visit Number  7    Number of Visits  13    Date for PT Re-Evaluation  06/20/19    Authorization Type  MCR: Kx mod by 15th visit, progress note at 10th visit    PT Start Time  0756    PT Stop Time  0840    PT Time Calculation (min)  44 min    Activity Tolerance  Patient tolerated treatment well    Behavior During Therapy  [facility] for tasks assessed/performed       Past Medical History:  Diagnosis Date  . Arthritis    in the neck  . Carotid artery occlusion   . Epilepsy (HCC)   . GERD (gastroesophageal reflux disease)   . Glaucoma   . Hyperlipidemia   . Hypertension   . Keratoconus of both eyes 1981  . Macular degeneration   . Migraines   . Neuromuscular disorder (HCC)    RA  . Peripheral vascular disease (HCC)    carotid occlusion surgery on left  . Raynaud's disease   . Retinal edema   . Rheumatoid arthritis(714.0)   . Stroke [facility](HCC)    two mild strokes presumed from left carotid stenosis    Past Surgical History:  Procedure Laterality Date  . CAROTID ARTERY ANGIOPLASTY  2008   Dr Madilyn FiremanHayes  . CAROTID ENDARTERECTOMY Left 11-15-2007  . [facility]  . CHOLECYSTECTOMY  1995  . CORNEAL TRANSPLANT     x 5 ; steroid inj. retnal information  . [facility]  . 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 EYE SURGERY Right 12/2016   cornea repair   . EYE SURGERY Right 09/20/2017   cornea transplant   . GLAUCOMA SURGERY Right 2018  . TRIGGER FINGER RELEASE Right 05/16/2018   Procedure: RELEASE TRIGGER FINGER/A-1 PULLEY RIGHT THUMB;  Surgeon:  Cindee SaltKuzma, Gary, MD;  Location: [facility];  Service: Orthopedics;  Laterality: Right;    There were no vitals filed for this visit.  Subjective Assessment - 05/25/19 0758    Subjective  "I am feeling pretty good, I do feel a slight migraine today but its not bad"    Currently in Pain?  Yes    Pain Score  3     Pain Location  Neck    Pain Orientation  Left    Pain Descriptors / Indicators  Aching    Pain Type  Chronic pain    Pain Onset  More than a month ago    Pain Frequency  Intermittent         [facility] PT Assessment - 05/25/19 0001      Assessment   Medical Diagnosis  Intractable chronic migraine without aura and without status migrainosus ,  Cervical myofascial pain syndrome     Referring Provider (PT)  Anson FretAhern, Antonia B, MD                    [facility] Treatment/Exercise - 05/25/19 0001      Neck Exercises: Machines for Strengthening   UBE (Upper Arm Bike)  L2 x 5 min    changing directionat at 2:30     Neck Exercises: Seated   Other Seated Exercise  SNAGS 2 x 10 hoding 1-2 seconds   heavy cues required for proper form/ hand placement     Neck Exercises: Supine   Cervical Isometrics  Extension;Right lateral flexion;Left lateral flexion;5 reps;10 secs      Shoulder Exercises: Supine   Other Supine Exercises  laying with spine parallel to spine: serratus punch, horizontal abd/ adduction, alternating ceiling punches, back stroke      Manual Therapy   Manual therapy comments  MTPR along bil upper trap x 1 ea,     Joint Mobilization  C3-C7 PA grade III, lateral gapping grade III C3-C7    Manual Traction  gentle manual traction and sub-occipital release      Neck Exercises: Stretches   Upper Trapezius Stretch  Left;Right;1 rep;30 seconds             PT Education - 05/25/19 0829    Education Details  updated HEP today for SNAGs and foam roll routine    Person(s) Educated  Patient    Methods  Explanation;Verbal cues;Handout     Comprehension  Verbalized understanding;Verbal cues required       PT Short Term Goals - 05/16/19 0818      PT SHORT TERM GOAL #1   Title  pt to be I with initial HEP    Period  Weeks    Status  Achieved        PT Long Term Goals - 05/16/19 0818      PT LONG TERM GOAL #1   Title  pt to demo proper posture and lifting mechancis to reduce and prevent neck/ shoulder pain     Period  Weeks    Status  On-going      PT LONG TERM GOAL #2   Title  report decreased spasm inthe upper traps and surrounding musculature to reduce pain to </= 2/10 and maintain cerivcal mobility    Baseline  5/10    Time  6    Period  Weeks    Status  On-going      PT LONG TERM GOAL #3   Title  pt to return to the gym / home rountine and report decreased HA occurence by >/= 1 week for pt goals and promote QOL    Baseline  increased occurence of migraine    Period  Weeks    Status  On-going      PT LONG TERM GOAL #4   Title  pt to be I with all HEP given as of last visit to maintain and progress current level of function    Period  Weeks    Status  On-going            Plan - 05/25/19 2671    Clinical Impression Statement  pt reports improvement in pain today. opted to hold off on TPDN but continued MTPR along bil upper trap and sub-occipital release. continues working scapulothoracic exercises and provided associated HEP handout. end of session she reported decreased pain and no HA.       Patient will benefit from skilled therapeutic intervention in order to improve the following deficits and impairments:  Improper body mechanics, Decreased strength, Decreased knowledge of precautions, Decreased balance, Pain, Decreased endurance, Impaired vision/preception, Increased fascial restricitons, Decreased activity tolerance, Postural dysfunction  Visit Diagnosis: Chronic nonintractable headache, unspecified headache type  Abnormal posture  Cervicalgia  Muscle weakness  (generalized)     Problem List Patient Active Problem List   Diagnosis Date Noted  . URI, acute 05/19/2018  . Cerumen impaction 05/19/2018  . Trigger thumb, right thumb 10/05/2017  . History of macular degeneration 09/13/2016  . History of migraine 09/06/2016  . History of seizures 09/06/2016  . Iritis of right eye 05/12/2016  . Rheumatoid arthritis with positive rheumatoid factor (HCC) 05/11/2016  . 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  . High risk medication use 05/11/2016  . Osteoporosis 05/11/2016  . 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 History of carotid endarterectomy 06/27/2014  . Migraine without aura 04/19/2013  . Seizures (HCC) 04/19/2013  . 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 Cough 01/14/2011  . Pulmonary fibrosis (HCC) 01/14/2011   Lulu Riding PT, DPT, LAT, ATC  05/25/19  8:44 AM      [facility] [address] [place], [place], [address] Phone: [phone]   Fax:  [phone]  Name: AIYA KEACH MRN: [id number] Date of Birth: [birth year]

## 2019-05-30 ENCOUNTER — Ambulatory Visit: Payer: Medicare Other | Admitting: Physical Therapy

## 2019-06-01 ENCOUNTER — Encounter: Payer: Self-pay | Admitting: Physical Therapy

## 2019-06-01 ENCOUNTER — Other Ambulatory Visit: Payer: Self-pay

## 2019-06-01 ENCOUNTER — Ambulatory Visit: Payer: Medicare Other | Admitting: Physical Therapy

## 2019-06-01 DIAGNOSIS — G8929 Other chronic pain: Secondary | ICD-10-CM

## 2019-06-01 DIAGNOSIS — R519 Headache, unspecified: Secondary | ICD-10-CM

## 2019-06-01 DIAGNOSIS — R293 Abnormal posture: Secondary | ICD-10-CM

## 2019-06-01 DIAGNOSIS — M542 Cervicalgia: Secondary | ICD-10-CM

## 2019-06-01 DIAGNOSIS — M6281 Muscle weakness (generalized): Secondary | ICD-10-CM

## 2019-06-01 NOTE — Therapy (Signed)
[facility] Outpatient Rehabilitation [facility] [address] [place], [place], [address] Phone: [phone]   Fax:  [phone]  Physical Therapy Treatment  Patient Details  Name: Brianna [name] MRN: [id number] Date of Birth: 06-08-1951 Referring Provider (PT): Anson Fret, MD    Encounter Date: 06/01/2019  PT End of Session - 06/01/19 1200    Visit Number  8    Number of Visits  13    Date for PT Re-Evaluation  06/20/19    Authorization Type  MCR: Kx mod by 15th visit, progress note at 10th visit    PT Start Time  0800    PT Stop Time  0841    PT Time Calculation (min)  41 min    Activity Tolerance  Patient tolerated treatment well    Behavior During Therapy  [facility] for tasks assessed/performed       Past Medical History:  Diagnosis Date  . Arthritis    in the neck  . Carotid artery occlusion   . Epilepsy (HCC)   . GERD (gastroesophageal reflux disease)   . Glaucoma   . Hyperlipidemia   . Hypertension   . Keratoconus of both eyes 1981  . Macular degeneration   . Migraines   . Neuromuscular disorder (HCC)    RA  . Peripheral vascular disease (HCC)    carotid occlusion surgery on left  . Raynaud's disease   . Retinal edema   . Rheumatoid arthritis(714.0)   . Stroke [facility])    two mild strokes presumed from left carotid stenosis    Past Surgical History:  Procedure Laterality Date  . CAROTID ARTERY ANGIOPLASTY  2008   Dr Madilyn Fireman  . CAROTID ENDARTERECTOMY Left 11-15-2007  . cataract extraction Right 04-2014   [facility]  . CHOLECYSTECTOMY  1995  . CORNEAL TRANSPLANT     x 5 ; steroid inj. retnal information  . 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 EYE SURGERY Right 12/2016   cornea repair   . EYE SURGERY Right 09/20/2017   cornea transplant   . GLAUCOMA SURGERY Right 2018  . TRIGGER FINGER RELEASE Right 05/16/2018   Procedure: RELEASE TRIGGER FINGER/A-1 PULLEY RIGHT THUMB;  Surgeon:  Cindee Salt, MD;  Location: [facility];  Service: Orthopedics;  Laterality: Right;    There were no vitals filed for this visit.                    [facility] Adult PT Treatment/Exercise - 06/01/19 0001      Self-Care   Self-Care  Other Self-Care Comments    Other Self-Care Comments   reviewed self tirgger point release. had the patient review were her trigger points were on herself. She required mod tactile cuing       Neck Exercises: Machines for Strengthening   UBE (Upper Arm Bike)  L2 x 5 min    changing directionat at 2:30 done independently      Neck Exercises: Standing   Other Standing Exercises  reviewed dumbell exercises: bicpes curl with psotrual setting and breathing 2x10 3lb; standing fleixon and scaption in the mirror x10 1lb with cuing for shoulders; over head press with hands       Manual Therapy   Manual Therapy  Joint mobilization    Manual therapy comments  skilled palpation and monitoring of pt throughout TPDN    Joint Mobilization  C3-C7 PA grade III    Soft  tissue mobilization  trigger point release to upper traps     Manual Traction  gentle manual traction and sub-occipital release      Neck Exercises: Stretches   Upper Trapezius Stretch  Left;Right;1 rep;30 seconds    Other Neck Stretches  mulligan rotation stretch 3x20 sec hold                PT Short Term Goals - 05/16/19 0818      PT SHORT TERM GOAL #1   Title  pt to be I with initial HEP    Period  Weeks    Status  Achieved        PT Long Term Goals - 05/16/19 0818      PT LONG TERM GOAL #1   Title  pt to demo proper posture and lifting mechancis to reduce and prevent neck/ shoulder pain     Period  Weeks    Status  On-going      PT LONG TERM GOAL #2   Title  report decreased spasm inthe upper traps and surrounding musculature to reduce pain to </= 2/10 and maintain cerivcal mobility    Baseline  5/10    Time  6    Period  Weeks    Status  On-going       PT LONG TERM GOAL #3   Title  pt to return to the gym / home rountine and report decreased HA occurence by >/= 1 week for pt goals and promote QOL    Baseline  increased occurence of migraine    Period  Weeks    Status  On-going      PT LONG TERM GOAL #4   Title  pt to be I with all HEP given as of last visit to maintain and progress current level of function    Period  Weeks    Status  On-going            Plan - 06/01/19 1049    Clinical Impression Statement  Patientdelined needling again. She continues to have a large spasm in her left upper trap. She reported improvement with manual therapy. She was tight the last few days but was able to use her program to work through hr pain and stiffness. Therapy reviewed light stretches and exercises. Patient reported a desire todo work at home with light dumbells. Therapy reviewed liught sumbell program with stabilization.    Personal Factors and Comorbidities  Age;Comorbidity 2    Comorbidities  hx or HA, cervicaliga, osteoporosis    Examination-Activity Limitations  Lift    Clinical Decision Making  Moderate    Rehab Potential  Good    PT Frequency  2x / week    PT Duration  4 weeks    PT Treatment/Interventions  ADLs/Self Care Home Management;Cryotherapy;Electrical Stimulation;Iontophoresis 4mg /ml Dexamethasone;Moist Heat;Ultrasound;Therapeutic activities;Therapeutic exercise;Dry needling;Taping;Manual techniques;Patient/family education;Balance training;Neuromuscular re-education;Passive range of motion    PT Next Visit Plan  , Review HEP, response to TPDN posterior cervical/ hsoulder structures. STW , shoulder strengthening, posture training, sleeping positioning,update HEP for foam roll routine    PT Home Exercise Plan  upper trap stretching, levator scapulae, chin tucks, scapular retraction, cervical stabilization       Patient will benefit from skilled therapeutic intervention in order to improve the following deficits and  impairments:  Improper body mechanics, Decreased strength, Decreased knowledge of precautions, Decreased balance, Pain, Decreased endurance, Impaired vision/preception, Increased fascial restricitons, Decreased activity tolerance, Postural dysfunction  Visit Diagnosis: Chronic nonintractable  headache, unspecified headache type  Abnormal posture  Cervicalgia  Muscle weakness (generalized)     Problem List Patient Active Problem List   Diagnosis Date Noted  . URI, acute 05/19/2018  . Cerumen impaction 05/19/2018  . Trigger thumb, right thumb 10/05/2017  . History of macular degeneration 09/13/2016  . History of migraine 09/06/2016  . History of seizures 09/06/2016  . Iritis of right eye 05/12/2016  . Rheumatoid arthritis with positive rheumatoid factor (HCC) 05/11/2016  . 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  . High risk medication use 05/11/2016  . Osteoporosis 05/11/2016  . 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 History of carotid endarterectomy 06/27/2014  . Migraine without aura 04/19/2013  . Seizures (HCC) 04/19/2013  . 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 Cough 01/14/2011  . Pulmonary fibrosis (HCC) 01/14/2011    Dessie Coma PT DPT  06/01/2019, 12:01 PM  [facility] [address] [place], [place], [address] Phone: [phone]   Fax:  [phone]  Name: Brianna [name] MRN: [id number] Date of Birth: [birth year]

## 2019-06-06 ENCOUNTER — Ambulatory Visit: Payer: Medicare Other | Admitting: Physical Therapy

## 2019-06-06 ENCOUNTER — Other Ambulatory Visit: Payer: Self-pay

## 2019-06-06 ENCOUNTER — Encounter: Payer: Self-pay | Admitting: Physical Therapy

## 2019-06-06 DIAGNOSIS — G8929 Other chronic pain: Secondary | ICD-10-CM

## 2019-06-06 DIAGNOSIS — M542 Cervicalgia: Secondary | ICD-10-CM

## 2019-06-06 DIAGNOSIS — R293 Abnormal posture: Secondary | ICD-10-CM

## 2019-06-06 DIAGNOSIS — R519 Headache, unspecified: Secondary | ICD-10-CM | POA: Diagnosis not present

## 2019-06-06 DIAGNOSIS — M6281 Muscle weakness (generalized): Secondary | ICD-10-CM

## 2019-06-06 NOTE — Therapy (Signed)
[facility] [address] [place], [place], [address] Phone: [phone]   Fax:  [phone]  Physical Therapy Treatment  Patient Details  Name: Brianna [name] MRN: [id number] Date of Birth: Aug 06, 1950 Referring Provider (PT): Anson Fret, MD    Encounter Date: 06/06/2019  PT End of Session - 06/06/19 0802    Visit Number  9    Number of Visits  13    Date for PT Re-Evaluation  06/20/19    Authorization Type  MCR: Kx mod by 15th visit, progress note at 10th visit    PT Start Time  0802    PT Stop Time  0851    PT Time Calculation (min)  49 min    Activity Tolerance  Patient tolerated treatment well       Past Medical History:  Diagnosis Date  . Arthritis    in the neck  . Carotid artery occlusion   . Epilepsy (HCC)   . GERD (gastroesophageal reflux disease)   . Glaucoma   . Hyperlipidemia   . Hypertension   . Keratoconus of both eyes 1981  . Macular degeneration   . Migraines   . Neuromuscular disorder (HCC)    RA  . Peripheral vascular disease (HCC)    carotid occlusion surgery on left  . Raynaud's disease   . Retinal edema   . Rheumatoid arthritis(714.0)   . Stroke [facility])    two mild strokes presumed from left carotid stenosis    Past Surgical History:  Procedure Laterality Date  . CAROTID ARTERY ANGIOPLASTY  2008   Dr Madilyn Fireman  . CAROTID ENDARTERECTOMY Left 11-15-2007  . cataract extraction Right 04-2014   [facility]  . CHOLECYSTECTOMY  1995  . CORNEAL TRANSPLANT     x 5 ; steroid inj. retnal information  . CORNEAL TRANSPLANT Right 01-22-2014   [facility]  . 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 EYE SURGERY Right 12/2016   cornea repair   . EYE SURGERY Right 09/20/2017   cornea transplant   . GLAUCOMA SURGERY Right 2018  . TRIGGER FINGER RELEASE Right 05/16/2018   Procedure: RELEASE TRIGGER FINGER/A-1 PULLEY RIGHT THUMB;  Surgeon: Cindee Salt, MD;  Location: [facility];   Service: Orthopedics;  Laterality: Right;    There were no vitals filed for this visit.  Subjective Assessment - 06/06/19 0803    Subjective  " I am doing more at home with planting plants, I notice if I don't push too much I am much better"    Currently in Pain?  Yes    Pain Score  3     Pain Orientation  Left    Pain Descriptors / Indicators  Aching    Pain Type  Chronic pain    Pain Onset  More than a month ago    Pain Frequency  Intermittent    Aggravating Factors   doing too much    Pain Relieving Factors  stretches    Effect of Pain on Daily Activities  headaches                       [facility] Adult PT Treatment/Exercise - 06/06/19 0001      Neck Exercises: Machines for Strengthening   UBE (Upper Arm Bike)  L2 x 4 min    changing direction at 2 min     Neck Exercises: Supine   Cervical Isometrics  Flexion;Extension;Right lateral flexion;Left lateral flexion;Right rotation;Left rotation;5 reps;10  secs      Shoulder Exercises: Supine   Other Supine Exercises  scapular retraction with ER 2 x 10 with green theraband      Modalities   Modalities  Traction      Traction   Type of Traction  Cervical    Min (lbs)  10    Max (lbs)  16    Hold Time  60    Rest Time  20    Time  10      Manual Therapy   Manual Therapy  Joint mobilization    Manual therapy comments  MTPR along bil upper trap, levator scapulae, scalnees and sub-occipitals on the L    Joint Mobilization  L first rib mobs grade III    Manual Traction  gentle manual traction and sub-occipital release             PT Education - 06/06/19 0829    Education Details  updated HEP for cervical isometrics in supine/ sidelying    Person(s) Educated  Patient    Methods  Explanation;Verbal cues;Handout    Comprehension  Verbalized understanding;Verbal cues required       PT Short Term Goals - 05/16/19 0818      PT SHORT TERM GOAL #1   Title  pt to be I with initial HEP    Period  Weeks     Status  Achieved        PT Long Term Goals - 05/16/19 0818      PT LONG TERM GOAL #1   Title  pt to demo proper posture and lifting mechancis to reduce and prevent neck/ shoulder pain     Period  Weeks    Status  On-going      PT LONG TERM GOAL #2   Title  report decreased spasm inthe upper traps and surrounding musculature to reduce pain to </= 2/10 and maintain cerivcal mobility    Baseline  5/10    Time  6    Period  Weeks    Status  On-going      PT LONG TERM GOAL #3   Title  pt to return to the gym / home rountine and report decreased HA occurence by >/= 1 week for pt goals and promote QOL    Baseline  increased occurence of migraine    Period  Weeks    Status  On-going      PT LONG TERM GOAL #4   Title  pt to be I with all HEP given as of last visit to maintain and progress current level of function    Period  Weeks    Status  On-going            Plan - 06/06/19 7858    Clinical Impression Statement  pt reports improvement in neck pain and HA occurrence but does reprot mild HA today. She opted to hold off on TPDN focusing on manual techniques today to release L upper trap/ levator scapular stiffness and pain. focused on cervical and scapualr stability today which she noted improvement in HA symptoms today. trialed mechanical traction today which she noted relief of pain as well.    PT Treatment/Interventions  ADLs/Self Care Home Management;Cryotherapy;Electrical Stimulation;Iontophoresis 4mg /ml Dexamethasone;Moist Heat;Ultrasound;Therapeutic activities;Therapeutic exercise;Dry needling;Taping;Manual techniques;Patient/family education;Balance training;Neuromuscular re-education;Passive range of motion    PT Next Visit Plan  , Review HEP, TPDN PRN, shoulder strengthening, posture training, sleeping positioning, cervical stab, scapular stab, rhomboid stretching    PT Home  Exercise Plan  upper trap stretching, levator scapulae, chin tucks, scapular retraction, cervical  stabilization, foam roll routnine, cevical isometrics    Consulted and Agree with Plan of Care  Patient       Patient will benefit from skilled therapeutic intervention in order to improve the following deficits and impairments:  Improper body mechanics, Decreased strength, Decreased knowledge of precautions, Decreased balance, Pain, Decreased endurance, Impaired vision/preception, Increased fascial restricitons, Decreased activity tolerance, Postural dysfunction  Visit Diagnosis: Chronic nonintractable headache, unspecified headache type  Abnormal posture  Cervicalgia  Muscle weakness (generalized)     Problem List Patient Active Problem List   Diagnosis Date Noted  . URI, acute 05/19/2018  . Cerumen impaction 05/19/2018  . Trigger thumb, right thumb 10/05/2017  . History of macular degeneration 09/13/2016  . History of migraine 09/06/2016  . History of seizures 09/06/2016  . Iritis of right eye 05/12/2016  . Rheumatoid arthritis with positive rheumatoid factor (HCC) 05/11/2016  . 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  . High risk medication use 05/11/2016  . Osteoporosis 05/11/2016  . 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 History of carotid endarterectomy 06/27/2014  . Migraine without aura 04/19/2013  . Seizures (HCC) 04/19/2013  . 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 Cough 01/14/2011  . Pulmonary fibrosis (HCC) 01/14/2011   Lulu Riding PT, DPT, LAT, ATC  06/06/19  8:45 AM      [facility] [address] [place], [place], [address] Phone: [phone]   Fax:  [phone]  Name: Brianna [name] MRN: [id number] Date of Birth: [birth year]

## 2019-06-08 ENCOUNTER — Ambulatory Visit: Payer: Medicare Other | Admitting: Physical Therapy

## 2019-06-08 ENCOUNTER — Other Ambulatory Visit: Payer: Self-pay

## 2019-06-08 ENCOUNTER — Encounter: Payer: Self-pay | Admitting: Physical Therapy

## 2019-06-08 DIAGNOSIS — G8929 Other chronic pain: Secondary | ICD-10-CM

## 2019-06-08 DIAGNOSIS — R519 Headache, unspecified: Secondary | ICD-10-CM | POA: Diagnosis not present

## 2019-06-08 DIAGNOSIS — M542 Cervicalgia: Secondary | ICD-10-CM

## 2019-06-08 DIAGNOSIS — M6281 Muscle weakness (generalized): Secondary | ICD-10-CM

## 2019-06-08 DIAGNOSIS — R293 Abnormal posture: Secondary | ICD-10-CM

## 2019-06-08 NOTE — Therapy (Signed)
[facility] [address] [place], [place], [address] Phone: [phone]   Fax:  [phone]  Physical Therapy Treatment  Patient Details  Name: OLLA DELANCEY MRN: [id number] Date of Birth: [birth year] Referring Provider (PT): Anson Fret, MD   Progress Note Reporting Period 02/26/2019 to 06/08/2019  See note below for Objective Data and Assessment of Progress/Goals.           Encounter Date: 06/08/2019  PT End of Session - 06/08/19 1045    Visit Number  10    Number of Visits  16    Date for PT Re-Evaluation  07/20/19    Authorization Type  MCR: Kx mod by 15th visit, progress note at 20th visit    PT Start Time  0801    PT Stop Time  0845    PT Time Calculation (min)  44 min    Activity Tolerance  Patient tolerated treatment well    Behavior During Therapy  [facility] for tasks assessed/performed       Past Medical History:  Diagnosis Date  . Arthritis    in the neck  . Carotid artery occlusion   . Epilepsy (HCC)   . GERD (gastroesophageal reflux disease)   . Glaucoma   . Hyperlipidemia   . Hypertension   . Keratoconus of both eyes 1981  . Macular degeneration   . Migraines   . Neuromuscular disorder (HCC)    RA  . Peripheral vascular disease (HCC)    carotid occlusion surgery on left  . Raynaud's disease   . Retinal edema   . Rheumatoid arthritis(714.0)   . Stroke [facility])    two mild strokes presumed from left carotid stenosis    Past Surgical History:  Procedure Laterality Date  . CAROTID ARTERY ANGIOPLASTY  2008   Dr Madilyn Fireman  . CAROTID ENDARTERECTOMY Left 11-15-2007  . cataract extraction Right 04-2014   [facility]  . CHOLECYSTECTOMY  1995  . CORNEAL TRANSPLANT     x 5 ; steroid inj. retnal information  . CORNEAL TRANSPLANT Right 01-22-2014   [facility]  . 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 EYE SURGERY Right 12/2016   cornea repair   . EYE SURGERY Right 09/20/2017   cornea transplant    . GLAUCOMA SURGERY Right 2018  . TRIGGER FINGER RELEASE Right 05/16/2018   Procedure: RELEASE TRIGGER FINGER/A-1 PULLEY RIGHT THUMB;  Surgeon: Cindee Salt, MD;  Location:  [facility];  Service: Orthopedics;  Laterality: Right;    There were no vitals filed for this visit.  Subjective Assessment - 06/08/19 0805    Subjective  Patient reports that her right shoulder blade is hurting her today. She fletgood after the mechanical traction the last time but that night she had significant radicualr pain down her arm. That pain has resolved. Today she feels like she is a little nauseated and had a headache this morning.    Limitations  Standing    How long can you sit comfortably?  unlimited    How long can you stand comfortably?  unlimited    How long can you walk comfortably?  unlimited    Diagnostic tests  N/A    Currently in Pain?  Yes    Pain Score  5     Pain Location  Head    Pain Descriptors / Indicators  Aching    Pain Type  Chronic pain    Pain Onset  More than a month ago  Pain Frequency  Intermittent    Aggravating Factors   doing too much    Pain Relieving Factors  stretches    Effect of Pain on Daily Activities  headaches         [facility] PT Assessment - 06/08/19 0001      AROM   Cervical - Right Rotation  61    Cervical - Left Rotation  60      Strength   Right Shoulder Flexion  4/5    Right Shoulder External Rotation  4/5    Left Shoulder Flexion  4/5    Left Shoulder ABduction  4/5      Palpation   Palpation comment  spasming and tenderness to palpation to upper traps and cervical musculature L>R                    [facility] Adult PT Treatment/Exercise - 06/08/19 0001      Shoulder Exercises: ROM/Strengthening   UBE (Upper Arm Bike)  2 min forward during 2 minutes back she reported ian increased headache       Manual Therapy   Manual Therapy  Joint mobilization    Manual therapy comments  skilled palpation and monitoring of pt  throughout TPDN    Soft tissue mobilization  trigger point release to upper traps     Manual Traction  gentle manual traction and sub-occipital release      Neck Exercises: Stretches   Upper Trapezius Stretch  Left;Right;1 rep;30 seconds    Levator Stretch  2 reps;20 seconds       Trigger Point Dry Needling - 06/08/19 0001    Consent Given?  Yes    Muscles Treated Head and Neck  Suboccipitals    Other Dry Needling  25x30 needle for all     Upper Trapezius Response  Twitch reponse elicited;Palpable increased muscle length    SubOccipitals Response  Twitch response elicited    Levator Scapulae Response  Twitch response elicited;Palpable increased muscle length           PT Education - 06/08/19 1045    Education Details  activity to do later to reduce muscle tension    Person(s) Educated  Patient    Methods  Explanation;Demonstration;Tactile cues;Verbal cues    Comprehension  Verbalized understanding;Returned demonstration;Verbal cues required;Tactile cues required       PT Short Term Goals - 06/08/19 1203      PT SHORT TERM GOAL #1   Title  pt to be I with initial HEP    Baseline  began educating at eval    Time  3    Period  Weeks    Status  On-going    Target Date  09/19/18        PT Long Term Goals - 06/08/19 1203      PT LONG TERM GOAL #1   Title  pt to demo proper posture and lifting mechancis to reduce and prevent neck/ shoulder pain     Time  6    Period  Weeks    Status  On-going      PT LONG TERM GOAL #2   Title  report decreased spasm inthe upper traps and surrounding musculature to reduce pain to </= 2/10 and maintain cerivcal mobility    Baseline  5/10    Time  6    Period  Weeks    Status  On-going      PT LONG TERM GOAL #3   Title  pt to return to the gym / home rountine and report decreased HA occurence by >/= 1 week for pt goals and promote QOL    Baseline  increased occurence of migraine    Time  6    Period  Weeks    Status  On-going       PT LONG TERM GOAL #4   Title  pt to be I with all HEP given as of last visit to maintain and progress current level of function    Time  6    Period  Weeks    Status  On-going            Plan - 06/08/19 1046    Clinical Impression Statement  Ther-ex was limited today by oncoming migaine. Patient had significant tightness in her sub-occipitals and upper traps. She requested needling to reduce tightness. Needling decreased her nausea, pain, and photo-sensativity. Therapy reviewed her stretches to do at home. next visit we will resume with ther-ex. Overall the patient feels like she is getting stronger and is able to manage her headahces better. Her strength mesurements and range measurements have improved but today likley is not the best day to measure with a migrane. She reported a signficant decrease in symptoms with treatment today. She would benefit from further therapy 1W6 but will likley not need all of those visits.    Personal Factors and Comorbidities  Age;Comorbidity 2    Comorbidities  hx or HA, cervicaliga, osteoporosis    Examination-Activity Limitations  Lift    Stability/Clinical Decision Making  Evolving/Moderate complexity    Clinical Decision Making  Moderate    Rehab Potential  Good    PT Frequency  2x / week    PT Treatment/Interventions  ADLs/Self Care Home Management;Cryotherapy;Electrical Stimulation;Iontophoresis 4mg /ml Dexamethasone;Moist Heat;Ultrasound;Therapeutic activities;Therapeutic exercise;Dry needling;Taping;Manual techniques;Patient/family education;Balance training;Neuromuscular re-education;Passive range of motion    PT Next Visit Plan  continue with current POC. Patient feels like she is getting stronger.    PT Home Exercise Plan  upper trap stretching, levator scapulae, chin tucks, scapular retraction, cervical stabilization, foam roll routnine, cevical isometrics    Consulted and Agree with Plan of Care  Patient       Patient will benefit from  skilled therapeutic intervention in order to improve the following deficits and impairments:  Improper body mechanics, Decreased strength, Decreased knowledge of precautions, Decreased balance, Pain, Decreased endurance, Impaired vision/preception, Increased fascial restricitons, Decreased activity tolerance, Postural dysfunction  Visit Diagnosis: Chronic nonintractable headache, unspecified headache type  Abnormal posture  Cervicalgia  Muscle weakness (generalized)     Problem List Patient Active Problem List   Diagnosis Date Noted  . URI, acute 05/19/2018  . Cerumen impaction 05/19/2018  . Trigger thumb, right thumb 10/05/2017  . History of macular degeneration 09/13/2016  . History of migraine 09/06/2016  . History of seizures 09/06/2016  . Iritis of right eye 05/12/2016  . Rheumatoid arthritis with positive rheumatoid factor ([place]) 05/11/2016  . 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  . High risk medication use 05/11/2016  . Osteoporosis 05/11/2016  . 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 History of carotid endarterectomy 06/27/2014  . Migraine without aura 04/19/2013  . Seizures ([place]) 04/19/2013  . 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 Cough 01/14/2011  . Pulmonary fibrosis ([place]) 01/14/2011    Carney Living  PT DPT 06/08/2019, 12:04 PM  [facility] [address] [place], [place], [address] Phone: [phone]  Fax:  [phone]  Name: Mayra Neeramela A Drew MRN: [id number] Date of Birth: [birth year]

## 2019-06-13 ENCOUNTER — Encounter: Payer: Self-pay | Admitting: Physical Therapy

## 2019-06-13 ENCOUNTER — Other Ambulatory Visit: Payer: Self-pay

## 2019-06-13 ENCOUNTER — Ambulatory Visit: Payer: Medicare Other | Admitting: Physical Therapy

## 2019-06-13 DIAGNOSIS — M6281 Muscle weakness (generalized): Secondary | ICD-10-CM

## 2019-06-13 DIAGNOSIS — M542 Cervicalgia: Secondary | ICD-10-CM

## 2019-06-13 DIAGNOSIS — R293 Abnormal posture: Secondary | ICD-10-CM

## 2019-06-13 DIAGNOSIS — G8929 Other chronic pain: Secondary | ICD-10-CM

## 2019-06-13 DIAGNOSIS — R519 Headache, unspecified: Secondary | ICD-10-CM | POA: Diagnosis not present

## 2019-06-13 NOTE — Therapy (Signed)
[place], [place], [address] Phone: [phone]   Fax:  [phone]  Physical Therapy Treatment  Patient Details  Name: Brianna [name] MRN: [id number] Date of Birth: [birth year] Referring Provider (PT): Melvenia Beam, MD    Encounter Date: 06/13/2019  PT End of Session - 06/13/19 0825    Visit Number  11    Number of Visits  16    Date for PT Re-Evaluation  07/20/19    Authorization Type  MCR: Kx mod by 15th visit, progress note at 20th visit    PT Start Time  0801    PT Stop Time  0843    PT Time Calculation (min)  42 min    Activity Tolerance  Patient tolerated treatment well    Behavior During Therapy  [facility] for tasks assessed/performed       Past Medical History:  Diagnosis Date  . Arthritis    in the neck  . Carotid artery occlusion   . Epilepsy ([place])   . GERD (gastroesophageal reflux disease)   . Glaucoma   . Hyperlipidemia   . Hypertension   . Keratoconus of both eyes 1981  . Macular degeneration   . Migraines   . Neuromuscular disorder (HCC)    RA  . Peripheral vascular disease ([place])    carotid occlusion surgery on left  . Raynaud's disease   . Retinal edema   . Rheumatoid arthritis(714.0)   . Stroke [facility])    two mild strokes presumed from left carotid stenosis    Past Surgical History:  Procedure Laterality Date  . CAROTID ARTERY ANGIOPLASTY  2008   Dr Amedeo Plenty  . CAROTID ENDARTERECTOMY Left 11-15-2007  . cataract extraction Right 04-2014   [place]  . CORNEAL TRANSPLANT     x 5 ; steroid inj. retnal information  . 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 EYE SURGERY Right 12/2016   cornea repair   . EYE SURGERY Right 09/20/2017   cornea transplant   . GLAUCOMA SURGERY Right 2018  . TRIGGER FINGER RELEASE Right 05/16/2018   Procedure: RELEASE TRIGGER FINGER/A-1 PULLEY RIGHT THUMB;   Surgeon: Daryll Brod, MD;  Location: [facility];  Service: Orthopedics;  Laterality: Right;    There were no vitals filed for this visit.  Subjective Assessment - 06/13/19 0804    Subjective  "I am doing better today, no HA which is one of the first mornings"    Patient Stated Goals  increaes neck ROM, decrase pain, HA severity    Currently in Pain?  No/denies         [facility] PT Assessment - 06/13/19 0001      Assessment   Medical Diagnosis  Intractable chronic migraine without aura and without status migrainosus ,  Cervical myofascial pain syndrome     Referring Provider (PT)  Melvenia Beam, MD                    [facility] Adult PT Treatment/Exercise - 06/13/19 0001      Neck Exercises: Machines for Strengthening   UBE (Upper Arm Bike)  L5 x 6 min    changing direction at 3 min     Neck Exercises: Supine   Cervical Isometrics  5 reps;10 secs;Extension   with chin tuck     Neck Exercises: Sidelying   Lateral Flexion  Both;10  reps   10 seconds     Shoulder Exercises: Supine   Other Supine Exercises  scapular stabilization routine: horizontal abduction 1 x 15 low, middle and high positon. scapular retraction with ER 2 x 15 with ,    with red theraband     Shoulder Exercises: Prone   Other Prone Exercises  L shoulder I's T's and Y's 2 x 10 ea. with no weight             PT Education - 06/13/19 0837    Education Details  updated HEP for I's, T's, Y's    Person(s) Educated  Patient    Methods  Explanation;Verbal cues;Handout    Comprehension  Verbalized understanding;Verbal cues required       PT Short Term Goals - 06/08/19 1203      PT SHORT TERM GOAL #1   Title  pt to be I with initial HEP    Baseline  began educating at eval    Time  3    Period  Weeks    Status  On-going    Target Date  09/19/18        PT Long Term Goals - 06/08/19 1203      PT LONG TERM GOAL #1   Title  pt to demo proper posture and lifting mechancis to  reduce and prevent neck/ shoulder pain     Time  6    Period  Weeks    Status  On-going      PT LONG TERM GOAL #2   Title  report decreased spasm inthe upper traps and surrounding musculature to reduce pain to </= 2/10 and maintain cerivcal mobility    Baseline  5/10    Time  6    Period  Weeks    Status  On-going      PT LONG TERM GOAL #3   Title  pt to return to the gym / home rountine and report decreased HA occurence by >/= 1 week for pt goals and promote QOL    Baseline  increased occurence of migraine    Time  6    Period  Weeks    Status  On-going      PT LONG TERM GOAL #4   Title  pt to be I with all HEP given as of last visit to maintain and progress current level of function    Time  6    Period  Weeks    Status  On-going            Plan - 06/13/19 0827    Clinical Impression Statement  pt reports no pain today and noted it was the first day she hasn't had a HA. otped to hold off on TPDN and focus on cervical and scapular stability which she reported increasing fatigue on the L compared to the R.  She reported significant relief of pain/ stiffness end of session.    PT Treatment/Interventions  ADLs/Self Care Home Management;Cryotherapy;Electrical Stimulation;Iontophoresis 4mg /ml Dexamethasone;Moist Heat;Ultrasound;Therapeutic activities;Therapeutic exercise;Dry needling;Taping;Manual techniques;Patient/family education;Balance training;Neuromuscular re-education;Passive range of motion    PT Next Visit Plan  continue with current POC. Patient feels like she is getting stronger. cervical/ scapular stability, continue reviewing posture for neck.    PT Home Exercise Plan  upper trap stretching, levator scapulae, chin tucks, scapular retraction, cervical stabilization, foam roll routnine, cevical isometrics    Consulted and Agree with Plan of Care  Patient       Patient will benefit  from skilled therapeutic intervention in order to improve the following deficits and  impairments:  Improper body mechanics, Decreased strength, Decreased knowledge of precautions, Decreased balance, Pain, Decreased endurance, Impaired vision/preception, Increased fascial restricitons, Decreased activity tolerance, Postural dysfunction  Visit Diagnosis: Chronic nonintractable headache, unspecified headache type  Abnormal posture  Cervicalgia  Muscle weakness (generalized)     Problem List Patient Active Problem List   Diagnosis Date Noted  . URI, acute 05/19/2018  . Cerumen impaction 05/19/2018  . Trigger thumb, right thumb 10/05/2017  . History of macular degeneration 09/13/2016  . History of migraine 09/06/2016  . History of seizures 09/06/2016  . Iritis of right eye 05/12/2016  . Rheumatoid arthritis with positive rheumatoid factor (HCC) 05/11/2016  . 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  . High risk medication use 05/11/2016  . Osteoporosis 05/11/2016  . 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 History of carotid endarterectomy 06/27/2014  . Migraine without aura 04/19/2013  . Seizures (HCC) 04/19/2013  . 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 Cough 01/14/2011  . Pulmonary fibrosis (HCC) 01/14/2011   Lulu Riding PT, DPT, LAT, ATC  06/13/19  8:44 AM      [facility] Outpatient Rehabilitation [facility] [address] [place], [place], [address] Phone: [phone]   Fax:  [phone]  Name: Brianna [name] MRN: [id number] Date of Birth: 04/07/1951

## 2019-06-18 ENCOUNTER — Other Ambulatory Visit (HOSPITAL_COMMUNITY)
Admission: RE | Admit: 2019-06-18 | Discharge: 2019-06-18 | Disposition: A | Discharge status: Home / Self Care | Payer: Medicare Other | Source: Ambulatory Visit | Attending: Internal Medicine | Admitting: Internal Medicine

## 2019-06-18 DIAGNOSIS — Z01812 Encounter for preprocedural laboratory examination: Secondary | ICD-10-CM | POA: Insufficient documentation

## 2019-06-18 DIAGNOSIS — Z20828 Contact with and (suspected) exposure to other viral communicable diseases: Secondary | ICD-10-CM | POA: Diagnosis not present

## 2019-06-19 ENCOUNTER — Other Ambulatory Visit (HOSPITAL_COMMUNITY): Payer: Medicare Other

## 2019-06-19 LAB — NOVEL CORONAVIRUS, NAA (HOSP ORDER, SEND-OUT TO REF LAB; TAT 18-24 HRS): SARS-CoV-2, NAA: NOT DETECTED

## 2019-06-20 ENCOUNTER — Ambulatory Visit: Payer: Medicare Other | Attending: Neurology | Admitting: Physical Therapy

## 2019-06-20 ENCOUNTER — Other Ambulatory Visit: Payer: Self-pay

## 2019-06-20 ENCOUNTER — Encounter: Payer: Self-pay | Admitting: Physical Therapy

## 2019-06-20 DIAGNOSIS — M6281 Muscle weakness (generalized): Secondary | ICD-10-CM | POA: Insufficient documentation

## 2019-06-20 DIAGNOSIS — M542 Cervicalgia: Secondary | ICD-10-CM | POA: Insufficient documentation

## 2019-06-20 DIAGNOSIS — G8929 Other chronic pain: Secondary | ICD-10-CM | POA: Insufficient documentation

## 2019-06-20 DIAGNOSIS — R293 Abnormal posture: Secondary | ICD-10-CM | POA: Insufficient documentation

## 2019-06-20 DIAGNOSIS — R519 Headache, unspecified: Secondary | ICD-10-CM | POA: Diagnosis present

## 2019-06-20 NOTE — Therapy (Signed)
[place], [place], [address] Phone: [phone]   Fax:  [phone]  Physical Therapy Treatment / Discharge  Patient Details  Name: Brianna [name] MRN: [id number] Date of Birth: 26-Jan-1951 Referring Provider (PT): Melvenia Beam, MD    Encounter Date: 06/20/2019  PT End of Session - 06/20/19 1331    Visit Number  12    Number of Visits  16    Date for PT Re-Evaluation  07/20/19    Authorization Type  MCR: Kx mod by 15th visit, progress note at 20th visit    PT Start Time  1330    PT Stop Time  1408    PT Time Calculation (min)  38 min    Activity Tolerance  Patient tolerated treatment well    Behavior During Therapy  [facility] for tasks assessed/performed       Past Medical History:  Diagnosis Date  . Arthritis    in the neck  . Carotid artery occlusion   . Epilepsy ([place])   . GERD (gastroesophageal reflux disease)   . Glaucoma   . Hyperlipidemia   . Hypertension   . Keratoconus of both eyes 1981  . Macular degeneration   . Migraines   . Neuromuscular disorder (H[facility])    RA  . Peripheral vascular disease ([place])    carotid occlusion surgery on left  . Raynaud's disease   . Retinal edema   . Rheumatoid arthritis(714.0)   . Stroke [facility])    two mild strokes presumed from left carotid stenosis    Past Surgical History:  Procedure Laterality Date  . CAROTID ARTERY ANGIOPLASTY  2008   Dr Amedeo Plenty  . CAROTID ENDARTERECTOMY Left 11-15-2007  . cataract extraction Right 04-2014   [facility]  . CORNEAL TRANSPLANT     x 5 ; steroid inj. retnal information  . CORNEAL TRANSPLANT Right 01-22-2014   [facility]  . 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 EYE SURGERY Right 12/2016   cornea repair   . EYE SURGERY Right 09/20/2017   cornea transplant   . GLAUCOMA SURGERY Right 2018  . TRIGGER FINGER RELEASE Right 05/16/2018   Procedure: RELEASE TRIGGER FINGER/A-1 PULLEY RIGHT  THUMB;  Surgeon: Daryll Brod, MD;  Location: [place];  Service: Orthopedics;  Laterality: Right;    There were no vitals filed for this visit.  Subjective Assessment - 06/20/19 1334    Subjective  " I am doing pretty, some HA more in the back of the neck on the R and the shoulder"    Currently in Pain?  Yes    Pain Score  5     Pain Orientation  Right         [facility] PT Assessment - 06/20/19 0001      Assessment   Medical Diagnosis  Intractable chronic migraine without aura and without status migrainosus ,  Cervical myofascial pain syndrome     Referring Provider (PT)  Melvenia Beam, MD       Observation/Other Assessments   Focus on Therapeutic Outcomes (FOTO)   not set-up in [place] Adult PT Treatment/Exercise - 06/20/19 0001      Neck Exercises: Machines for Strengthening   UBE (Upper Arm Bike)  L6 x 4 min    chancing direction at 2 min  Manual Therapy   Joint Mobilization  C3-C7 PA grade III    Soft tissue mobilization  trigger point release to upper traps     Manual Traction  gentle manual traction and sub-occipital release      Neck Exercises: Stretches   Upper Trapezius Stretch  Left;Right;1 rep;30 seconds             PT Education - 06/20/19 1411    Education Details  reviewed previously provided HEP and how to progress strengthening and endurance training appropriately.    Person(s) Educated  Patient    Methods  Explanation;Verbal cues;Handout    Comprehension  Verbalized understanding;Verbal cues required       PT Short Term Goals - 06/20/19 1354      PT SHORT TERM GOAL #1   Title  pt to be I with initial HEP    Period  Weeks    Status  Achieved        PT Long Term Goals - 06/20/19 1354      PT LONG TERM GOAL #1   Title  pt to demo proper posture and lifting mechancis to reduce and prevent neck/ shoulder pain     Period  Weeks    Status  Achieved      PT LONG TERM GOAL #2   Title   report decreased spasm inthe upper traps and surrounding musculature to reduce pain to </= 2/10 and maintain cerivcal mobility    Baseline  pain fluctuates and depends on activity.    Period  Weeks    Status  Partially Met      PT LONG TERM GOAL #3   Title  pt to return to the gym / home rountine and report decreased HA occurence by >/= 1 week for pt goals and promote QOL    Period  Weeks    Status  Achieved      PT LONG TERM GOAL #4   Title  pt to be I with all HEP given as of last visit to maintain and progress current level of function    Period  Weeks    Status  Achieved            Plan - 06/20/19 1408    Clinical Impression Statement  Brianna [name] has made great progress with physical therapy reporting decreased pain and reduced HA occurence, She did report mild HA today that relieved with stretch and self lead MTPR techniques. She has met or partially met all goals today and is able to maintain and progress her function independently and will be discharged from PT today.    PT Next Visit Plan  D/C    PT Home Exercise Plan  upper trap stretching, levator scapulae, chin tucks, scapular retraction, cervical stabilization, foam roll routnine, cevical isometrics    Consulted and Agree with Plan of Care  Patient       Patient will benefit from skilled therapeutic intervention in order to improve the following deficits and impairments:  Improper body mechanics, Decreased strength, Decreased knowledge of precautions, Decreased balance, Pain, Decreased endurance, Impaired vision/preception, Increased fascial restricitons, Decreased activity tolerance, Postural dysfunction  Visit Diagnosis: Chronic nonintractable headache, unspecified headache type  Abnormal posture  Cervicalgia  Muscle weakness (generalized)     Problem List Patient Active Problem List   Diagnosis Date Noted  . URI, acute 05/19/2018  . Cerumen impaction 05/19/2018  . Trigger thumb, right thumb 10/05/2017   . History of macular degeneration 09/13/2016  .  History of migraine 09/06/2016  . History of seizures 09/06/2016  . Iritis of right eye 05/12/2016  . Rheumatoid arthritis with positive rheumatoid factor ([place]) 05/11/2016  . 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  . High risk medication use 05/11/2016  . Osteoporosis 05/11/2016  . 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 History of carotid endarterectomy 06/27/2014  . Migraine without aura 04/19/2013  . Seizures ([place]) 04/19/2013  . 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 Cough 01/14/2011  . Pulmonary fibrosis ([place]) 01/14/2011   Starr Lake PT, DPT, LAT, ATC  06/20/19  2:14 PM      [facility] [address] [place], [place], [address] Phone: [phone]   Fax:  [phone]  Name: Brianna [name] MRN: [id number] Date of Birth: 01-22-1951     PHYSICAL THERAPY DISCHARGE SUMMARY  Visits from Start of Care: 12  Current functional level related to goals / functional outcomes: See goals,    Remaining deficits: Intermittent HA that is controlled with stretching and exercise.    Education / Equipment: HEP, theraband, posture, lifting mechanics,   Plan: Patient agrees to discharge.  Patient goals were partially met. Patient is being discharged due to being pleased with the current functional level.  ?????           Hilaria Titsworth PT, DPT, LAT, ATC  06/20/19  2:16 PM

## 2019-06-21 ENCOUNTER — Other Ambulatory Visit: Payer: Self-pay | Admitting: *Deleted

## 2019-06-21 DIAGNOSIS — J8410 Pulmonary fibrosis, unspecified: Secondary | ICD-10-CM

## 2019-06-22 ENCOUNTER — Other Ambulatory Visit: Payer: Self-pay

## 2019-06-22 ENCOUNTER — Ambulatory Visit (INDEPENDENT_AMBULATORY_CARE_PROVIDER_SITE_OTHER): Payer: Medicare Other | Admitting: Internal Medicine

## 2019-06-22 ENCOUNTER — Ambulatory Visit (INDEPENDENT_AMBULATORY_CARE_PROVIDER_SITE_OTHER): Payer: Medicare Other

## 2019-06-22 ENCOUNTER — Encounter: Payer: Self-pay | Admitting: Internal Medicine

## 2019-06-22 DIAGNOSIS — J8410 Pulmonary fibrosis, unspecified: Secondary | ICD-10-CM

## 2019-06-22 DIAGNOSIS — R05 Cough: Secondary | ICD-10-CM | POA: Diagnosis not present

## 2019-06-22 DIAGNOSIS — R059 Cough, unspecified: Secondary | ICD-10-CM

## 2019-06-22 LAB — PULMONARY FUNCTION TEST
DL/VA % pred: 108 %
DL/VA: 4.59 ml/min/mmHg/L
DLCO unc % pred: 70 %
DLCO unc: 12.93 ml/min/mmHg
FEF 25-75 Post: 1.94 L/sec
FEF 25-75 Pre: 1.36 L/sec
FEF2575-%Change-Post: 42 %
FEF2575-%Pred-Post: 98 %
FEF2575-%Pred-Pre: 68 %
FEV1-%Change-Post: 10 %
FEV1-%Pred-Post: 68 %
FEV1-%Pred-Pre: 62 %
FEV1-Post: 1.51 L
FEV1-Pre: 1.37 L
FEV1FVC-%Change-Post: 2 %
FEV1FVC-%Pred-Pre: 106 %
FEV6-%Change-Post: 8 %
FEV6-%Pred-Post: 65 %
FEV6-%Pred-Pre: 60 %
FEV6-Post: 1.81 L
FEV6-Pre: 1.67 L
FEV6FVC-%Change-Post: 0 %
FEV6FVC-%Pred-Post: 104 %
FEV6FVC-%Pred-Pre: 103 %
FVC-%Change-Post: 7 %
FVC-%Pred-Post: 62 %
FVC-%Pred-Pre: 58 %
FVC-Post: 1.81 L
FVC-Pre: 1.68 L
Post FEV1/FVC ratio: 83 %
Post FEV6/FVC ratio: 100 %
Pre FEV1/FVC ratio: 82 %
Pre FEV6/FVC Ratio: 99 %
RV % pred: 81 %
RV: 1.63 L
TLC % pred: 71 %
TLC: 3.41 L

## 2019-06-22 NOTE — Progress Notes (Signed)
Full PFT performed today. °

## 2019-06-22 NOTE — Progress Notes (Signed)
Subjective:     Patient ID: Brianna [name], female   DOB: 1950/10/30,     MRN: [id number]       Brief patient profile:  56 yowf   quit smoking age 68 with no resp problems then @  36 bad pneumonia.  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   No sign doe but not that active.   Sleeping ok without nocturnal  or early am exac of resp c/o's or need for noct saba.  rec Ok to restart boniva the first of September and every month but if your respiratory or reflux symptoms worsen it's first medication I would stop and take in it's place Reclast IV yearly.    11/13/2013 f/u ov/Wright Gravely re:  PF assoc with RA Chief Complaint  Patient presents with  . Followup with PFT    Pt states that her breathing is unchanged since her last visit. No new co's today.    can now do 1st to 2nd floor s sob and working out regularly s limits due to sob rec  Return in one year for pfts and cxr and call sooner if not decline in tolerance > did not return       04/22/2017   Pulmonary office visit/ Shya Kovatch  / re-establish re RA assoc ILD  Chief Complaint  Patient presents with  . Pulmonary Consult    Self referral for cough x 3-4 months. She is coughing up white to yellow sputum.    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  rec Augmentin 875 mg take one pill twice daily  X 10 days - take at breakfast and supper with large glass of water.  It would help reduce the usual side effects (diarrhea and yeast infections) if you ate cultured yogurt at lunch.  Prednisone 10 mg take  4 each am x 2 days,   2 each am x 2 days,  1 each am x 2 days and stop  Stop fosfamax for now  Pantoprazole (protonix) 40 mg   Take  30-60 min before first meal of the day and Pepcid (famotidine)  20 mg one @  bedtime until return to office - this is the best way to tell whether stomach acid is contributing to your problem.   GERD diet     06/14/2017  f/u ov/Deirdre Gryder re:  uacs ? Aggravated by fosfamax  Chief Complaint  Patient presents with  . Follow-up    PFT done today. Her cough is much improved. No new co's. She wants to discuss alternative med for bones since we stopped the fosamax.  cough still present somedays  p stirring in am / no pm cough at all which is quite an improvement s need for cough suppression Rates herself at 90% better  rec Restart fosmax 2 weeks before visit (= 2 doses ) prior to your visit with your rheumatologist Risk of reflux goes up on fosfamax so ideally I would recommend a substitute either Reclast or Prolia if feasible but defer the final call on this issue to your rheumatologist     06/21/2018  f/u ov/Harvest Deist re:  ? RA lung dz  Chief Complaint  Patient presents with  . Follow-up    PFT's done. Cough has resolved. No new co's.   Dyspnea:  Not limited by breathing from desired activities   Cough: none Sleeping: 20 degrees helps with nasal congestion SABA use: none 02: none  Systemic RA control good x has retinal involvement > f/u [facility]  rec Regular physical activity with monitoring of 02 saturations toward the end of exercise (nl values lower 90's) and call if trending down    06/22/2019  f/u ov/Kamiya Acord re: ? RA lung dz  Chief Complaint   Patient presents with  . Follow-up    PFT done today. Increased dry cough in the am.    Dyspnea:  Not limited by breathing from desired activities  But not doing aerobics due to gym Cough: throat clearing assoc with nasal congestion x 3 m/ has not tried otcs Sleeping: 20 degrees HOB up  SABA use: none  02: none    No obvious day to day or daytime variability or assoc excess/ purulent sputum or mucus plugs or hemoptysis or cp or chest tightness, subjective wheeze or overt sinus or hb symptoms.   sleeping  without nocturnal  or early am exacerbation  of respiratory  c/o's or need for noct saba. Also denies any obvious fluctuation of symptoms with weather or environmental changes or other aggravating or alleviating factors except as outlined above   No unusual exposure hx or h/o childhood pna/ asthma or knowledge of premature birth.  Current Allergies, Complete Past Medical History, Past Surgical History, Family History, and Social History were reviewed in [employer] record.  ROS  The following are not active complaints unless bolded Hoarseness, sore throat, dysphagia, dental problems, itching, sneezing,  nasal congestion or discharge of excess mucus/ sense of pnds no better on flonas or purulent secretions, ear ache,   fever, chills, sweats, unintended wt loss or wt gain, classically pleuritic or exertional cp,  orthopnea pnd or arm/hand swelling  or leg swelling, presyncope, palpitations, abdominal pain, anorexia, nausea, vomiting, diarrhea  or change in bowel habits or change in bladder habits, change in stools or change in urine, dysuria, hematuria,  rash, arthralgias better on humira , visual complaints, headache, numbness, weakness or ataxia or problems with walking or coordination,  change in mood or  memory.        Current Meds  Medication Sig  . amLODipine (NORVASC) 5 MG tablet Take 5 mg by mouth daily.    . Ascorbic Acid 500 MG CAPS Take by mouth.  Marland Kitchen aspirin  325 MG tablet Take 325 mg by mouth daily.  Marland Kitchen atorvastatin (LIPITOR) 40 MG tablet Take 40 mg by mouth daily.    . botulinum toxin Type A (BOTOX) 100 units SOLR injection Inject IM into the head and neck muscles every 3 months by provider in office  . butalbital-acetaminophen-caffeine (FIORICET, ESGIC) 50-325-40 MG tablet Take 1 tablet by mouth every 6 (six) hours as needed  for headache.  . Calcium Carbonate-Vit D-Min (CALCIUM 1200 PO) Take 2 capsules by mouth daily.    . Cholecalciferol (VITAMIN D PO) Take 1,000 Units by mouth daily.   . COSOPT PF 2-0.5 % SOLN ophthalmic solution As directed  . DILANTIN 100 MG ER capsule TAKE 3 CAPSULES BY MOUTH DAILY  . Dorzolamide HCl-Timolol Mal (COSOPT OP) Cosopt (PF) 2 %-0.5 % eye drops in a dropperette  . ezetimibe (ZETIA) 10 MG tablet Take 10 mg by mouth daily.    . famotidine (PEPCID) 20 MG tablet Take 1 tablet (20 mg total) by mouth at bedtime.  Marland Kitchen HUMIRA PEN 40 MG/0.4ML PNKT As directed  . leflunomide (ARAVA) 20 MG tablet Take 20 mg by mouth daily.  Marland Kitchen LORazepam (ATIVAN) 0.5 MG tablet Take 0.5 mg by mouth daily as needed for anxiety or sleep.   . prednisoLONE acetate (PRED FORTE) 1 % ophthalmic suspension Place 1-4 drops into both eyes daily. 2 TIMES DAILY DROPS INTO R EYE--6 TIMES DAILY DROP INTO L EYE  . promethazine (PHENERGAN) 25 MG tablet Take 1 tablet (25 mg total) by mouth every 6 (six) hours as needed for nausea or vomiting.  . SUMAtriptan (IMITREX) 6 MG/0.5ML SOLN injection Inject 0.5 mLs (6 mg total) into the skin as needed for migraine.          Objective:   Physical Exam   amb wf nad   Wt 130 01/14/2011 > 08/27/2013  126 >  11/13/2013 125 > 04/22/2017  125 > 06/14/2017   127 > 10/11/2017  124 > 06/21/2018  126 > 06/22/2019  128   Vital signs reviewed - Note on arrival 02 sats  99% on RA      HEENT : pt wearing mask not removed for exam due to covid -19 concerns.    NECK :  without JVD/Nodes/TM/ nl carotid upstrokes  bilaterally   LUNGS: no acc muscle use,  Nl contour chest with minimal insp crackles in bases  bilaterally without cough on insp or exp maneuvers   CV:  RRR  no s3 or murmur or increase in P2, and no edema   ABD:  soft and nontender with nl inspiratory excursion in the supine position. No bruits or organomegaly appreciated, bowel sounds nl  MS:  Nl gait/ ext warm with RA deformities both hands, calf tenderness, cyanosis or clubbing    SKIN: warm and dry without lesions    NEURO:  alert, approp, nl sensorium with  no motor or cerebellar deficits apparent.     CXR PA and Lateral:   06/22/2019 :    I personally reviewed images and agree with radiology impression as follows:   No acute cardiopulmonary disease. Stable bibasilar interstitial changes compatible with known fibrosis.     Assessment:

## 2019-06-22 NOTE — Patient Instructions (Addendum)
Make sure you check your oxygen saturations at highest level of activity once a week to see if there is any downward trend   Pulse oximeter 35 dollars most drugstores  Try zyrtec 10 mg at bedtime  (24 hour pill) to helps nasal symptoms if not consider ENT evaluation.   Please remember to go to the  x-ray department  for your tests - we will call you with the results when they are available    Please schedule a follow up visit in 12 months but call sooner if needed Add:  Cough flared off ppi ac so add back if not better on zyrtec

## 2019-06-22 NOTE — Progress Notes (Signed)
Spoke with pt and notified of results per Dr. Wert. Pt verbalized understanding and denied any questions. 

## 2019-06-24 ENCOUNTER — Encounter: Payer: Self-pay | Admitting: Internal Medicine

## 2019-06-24 NOTE — Assessment & Plan Note (Addendum)
-   Sinus CT  01/14/2011 > neg - Trial off fosfamax and on GERD rx 04/22/17 > improved 06/14/2017   - recurrent fall 2020 no better on flonase > try zyrtec and if not better add back ppi ac   Upper airway cough syndrome (previously labeled PNDS),  is so named because it's frequently impossible to sort out how much is  CR/sinusitis with freq throat clearing (which can be related to primary GERD)   vs  causing  secondary (" extra esophageal")  GERD from wide swings in gastric pressure that occur with throat clearing, often  promoting self use of mint and menthol lozenges that reduce the lower esophageal sphincter tone and exacerbate the problem further in a cyclical fashion.   These are the same pts (now being labeled as having "irritable larynx syndrome" by some cough centers) who not infrequently have a history of having failed to tolerate ace inhibitors,  dry powder inhalers or biphosphonates or report having atypical/extraesophageal reflux symptoms that don't respond to standard doses of PPI  and are easily confused as having aecopd or asthma flares by even experienced allergists/ pulmonologists (myself included).   >>> ent f/u if not better on above plan.

## 2019-06-24 NOTE — Assessment & Plan Note (Signed)
Assoc with RA      - PFT's 06/15/2006 FEV1 (1.53)  Ratio 84 and DLC0 46 corrects to 90%     - PFT's  03/04/2011  FEV1 (1.73)  Ratio 83 and VC 2.11 and DLC0 63% corrects  94%     - PFT's 11/13/2013    VC 1.94 with DLCO 65 corrects to 102%     - 08/27/2013  Walked RA x 2 laps @ 185 ft each stopped due to  Oximeter stopped recording accurately but sats still 96% and no sob - PFT's  06/14/2017  FVC 1.71  (60 % ) ratio 89  p 5 % improvement from saba p nothing prior to study with DLCO  59 % corrects to 97 % for alv volume   PFT's  06/21/2018  FVC  1.85 (65%)   p no % improvement from saba p nothing prior to study with DLCO  65 % corrects to 106  % for alv volume   PFT's  06/22/2019  FVC 1.68 (58 % )   with DLCO  12.93 (70%) corrects to 4.59 (108 %)  for alv volume and FV curve nl    Really no convincing trend down in lung volumes or dlco and key is to rx the underlying dz per rheum as planned  Advised: Make sure you check your oxygen saturations at highest level of activity to be sure it stays over 90%  And call if trending down.   Pt informed of the seriousness of COVID 19 infection as a direct risk to their health  and safey and to those of their loved ones and should continue to wear facemask in public and minimize exposure to public locations but especially avoid any area or activity where non-close contacts are not observing distancing or wearing an appropriate face mask.    F/u can be yearly, sooner if needed  > 50% of this 25 min office visit spent in counseling.

## 2019-06-26 ENCOUNTER — Telehealth: Payer: Self-pay | Admitting: *Deleted

## 2019-06-26 NOTE — Telephone Encounter (Signed)
Patient is returning phone call. Patient phone number is [phone].

## 2019-06-26 NOTE — Telephone Encounter (Signed)
LMTCB

## 2019-06-26 NOTE — Telephone Encounter (Signed)
-----   Message from Tanda Rockers, MD sent at 06/24/2019  6:43 AM EST ----- Looks like her cough/pnds flared off ppi so if zyrtec not helping add  add back Pantoprazole (protonix) 40 mg   Take  30-60 min before first meal of the day and continue Pepcid (famotidine)  20 mg one @  bedtime x one month before considering ent eval

## 2019-06-28 NOTE — Telephone Encounter (Signed)
Spoke with the pt and notified of recs per MW  She verbalized understanding  Nothing further needed 

## 2019-07-25 ENCOUNTER — Telehealth: Payer: Self-pay

## 2019-07-25 NOTE — Telephone Encounter (Signed)
Error. DWD  

## 2019-08-02 ENCOUNTER — Telehealth: Payer: Self-pay | Admitting: Rheumatology

## 2019-08-02 NOTE — Telephone Encounter (Signed)
Patient called with questions regarding the COVID vaccine.  Patient states she had her 1st Shingle vaccine and is due to have her 2nd Shingle vaccine in March.  Patient states if she is able to sign up for the COVID vaccine does she need to postpone the 2nd Shingle vaccine.  Patient states she is also due to take her Humira injection today and is asking if she needs to stop taking it for any length of time before having the COVID vaccine.

## 2019-08-02 NOTE — Telephone Encounter (Signed)
Patient returned call.  She is confused about her vaccine dosing schedule.  She received the first Shingrix vaccine on 07/19/2019 from her primary care provider.  Her provider advised her to hold her Humira for 14 days after shingles injection.  Her last Humira dose was 07/05/2019. she is nervous about taking the second shingles vaccine and signing up for the Covid vaccine.  She is fearful that there is an interaction between the 2 vaccines or with Humira.  Patient states she does not want to ruin her chances of getting the Covid vaccine as she is due for her second Shingrix vaccine in March.  Inform patient that although she is on immunosuppressive agents she may receive inactivated vaccines and continue Humira.  Advised that studies have shown the decrease in immunity to vaccines as not clinically significant.  Also holding Humira could potentially induce a flare causing complications and/or prednisone use which increases risk of infection.  The only vaccine she should not receive while on Humira is a live vaccine.  Patient verbalized understanding.  Advised patient that the second Shingrix vaccine can be administered 2 to 6 months after the first dose.  She is able to get the vaccine March through the end of June.  Patient verbalized understanding  Informed patient that since Covid-19 vaccine is a new vaccine and has not been studied in combination with other immunizations it is recommended to space other vaccines 14 days apart from the COVID-19 vaccine.  Patient verbalized understanding.  Patient states she will go ahead and sign up with the health department and will get her Covid vaccine as soon as it is available.  She appreciates the information provided and knows now she can delay her second shingles vaccine depending on when she gets with her the vaccine.  All questions encouraged and answered.  Instructed patient to call with any other questions or concerns.   Verlin Fester, PharmD, [place],  CPP Clinical Specialty Pharmacist [phone]  08/02/2019 3:04 PM

## 2019-08-02 NOTE — Telephone Encounter (Signed)
Returned patient call no answer.  Left message and requested return call to discuss vaccines.

## 2019-08-06 ENCOUNTER — Other Ambulatory Visit: Payer: Self-pay

## 2019-08-06 MED ORDER — PANTOPRAZOLE SODIUM 40 MG PO TBEC: 40.0000 mg | DELAYED_RELEASE_TABLET | Freq: Every day | ORAL | 0 refills | Status: DC

## 2019-08-07 ENCOUNTER — Ambulatory Visit: Payer: Medicare PPO | Admitting: Neurology

## 2019-08-07 ENCOUNTER — Other Ambulatory Visit: Payer: Self-pay

## 2019-08-07 VITALS — Temp 97.9°F

## 2019-08-07 DIAGNOSIS — G43719 Chronic migraine without aura, intractable, without status migrainosus: Secondary | ICD-10-CM | POA: Diagnosis not present

## 2019-08-07 MED ORDER — SUMATRIPTAN SUCCINATE 6 MG/0.5ML ~~LOC~~ SOLN: 6.0000 mg | SUBCUTANEOUS | 11 refills | Status: DC | PRN

## 2019-08-07 NOTE — Progress Notes (Signed)
Interval history 08/07/2019: She has only 4 migraine days a month taken care of by imitrex, significant.  >70% improvement in frequency of migraines, she is thrilled. Baseline 15 severemigraine days a month and daily headaches. + masseters. if needed.Dry needling: cervical myofascial pain, Send back to dry needling - [address] for cervical myofascial pain syndrome. Gave samples for Nurtec for her to try as well acutely at last visit.   Tried: VPA, Topamax, antidepressants, norvasc, phenergan, frova, aimovig, ajovy, fioricet, (save 6 As)     Consent Form Botulism Toxin Injection For Chronic Migraine    Reviewed orally with patient, additionally signature is on file:  Botulism toxin has been approved by the Federal drug administration for treatment of chronic migraine. Botulism toxin does not cure chronic migraine and it may not be effective in some patients.  The administration of botulism toxin is accomplished by injecting a small amount of toxin into the muscles of the neck and head. Dosage must be titrated for each individual. Any benefits resulting from botulism toxin tend to wear off after 3 months with a repeat injection required if benefit is to be maintained. Injections are usually done every 3-4 months with maximum effect peak achieved by about 2 or 3 weeks. Botulism toxin is expensive and you should be sure of what costs you will incur resulting from the injection.  The side effects of botulism toxin use for chronic migraine may include:   -Transient, and usually mild, facial weakness with facial injections  -Transient, and usually mild, head or neck weakness with head/neck injections  -Reduction or loss of forehead facial animation due to forehead muscle weakness  -Eyelid drooping  -Dry eye  -Pain at the site of injection or bruising at the site of injection  -Double vision  -Potential unknown long term risks  Contraindications: You should not have Botox if you are pregnant,  nursing, allergic to albumin, have an infection, skin condition, or muscle weakness at the site of the injection, or have myasthenia gravis, Lambert-Eaton syndrome, or ALS.  It is also possible that as with any injection, there may be an allergic reaction or no effect from the medication. Reduced effectiveness after repeated injections is sometimes seen and rarely infection at the injection site may occur. All care will be taken to prevent these side effects. If therapy is given over a long time, atrophy and wasting in the muscle injected may occur. Occasionally the patient's become refractory to treatment because they develop antibodies to the toxin. In this event, therapy needs to be modified.  I have read the above information and consent to the administration of botulism toxin.    BOTOX PROCEDURE NOTE FOR MIGRAINE HEADACHE    Contraindications and precautions discussed with patient(above). Aseptic procedure was observed and patient tolerated procedure. Procedure performed by Dr. Georgia Dom  The condition has existed for more than 6 months, and pt does not have a diagnosis of ALS, Myasthenia Gravis or Lambert-Eaton Syndrome.  Risks and benefits of injections discussed and pt agrees to proceed with the procedure.  Written consent obtained  These injections are medically necessary. Pt  receives good benefits from these injections. These injections do not cause sedations or hallucinations which the oral therapies may cause.  Description of procedure:  The patient was placed in a sitting position. The standard protocol was used for Botox as follows, with 5 units of Botox injected at each site:   -Procerus muscle, midline injection DID NOT PERFORM  -Corrugator muscle, bilateral injection  DID NOT PERFORM  -Frontalis muscle, bilateral injection, with 2 sites each side, medial injection was performed in the upper one third of the frontalis muscle, in the region vertical from the medial inferior  edge of the superior orbital rim. The lateral injection was again in the upper one third of the forehead vertically above the lateral limbus of the cornea, 1.5 cm lateral to the medial injection site.  -Temporalis muscle injection, 4 sites, bilaterally. The first injection was 3 cm above the tragus of the ear, second injection site was 1.5 cm to 3 cm up from the first injection site in line with the tragus of the ear. The third injection site was 1.5-3 cm forward between the first 2 injection sites. The fourth injection site was 1.5 cm posterior to the second injection site.   -Occipitalis muscle injection, 3 sites, bilaterally. The first injection was done one half way between the occipital protuberance and the tip of the mastoid process behind the ear. The second injection site was done lateral and superior to the first, 1 fingerbreadth from the first injection. The third injection site was 1 fingerbreadth superiorly and medially from the first injection site.  -Cervical paraspinal muscle injection, 2 sites, bilateral knee first injection site was 1 cm from the midline of the cervical spine, 3 cm inferior to the lower border of the occipital protuberance. The second injection site was 1.5 cm superiorly and laterally to the first injection site.  -Trapezius muscle injection was performed at 3 sites, bilaterally. The first injection site was in the upper trapezius muscle halfway between the inflection point of the neck, and the acromion. The second injection site was one half way between the acromion and the first injection site. The third injection was done between the first injection site and the inflection point of the neck.   Will return for repeat injection in 3 months.   200 units of Botox was used, any Botox not injected was wasted. The patient tolerated the procedure well, there were no complications of the above procedure.

## 2019-08-07 NOTE — Progress Notes (Signed)
Botox- 200 units x 1 vial Lot: [id number] Expiration: 03/2022 NDC: 8343-7357-89  Bacteriostatic 0.9% Sodium Chloride- 82mL total Lot: [id number] Expiration: 10/18/2019 NDC: 1282-0813-88  Dx: T19.597 B/B

## 2019-08-16 ENCOUNTER — Other Ambulatory Visit: Payer: Self-pay

## 2019-08-16 ENCOUNTER — Telehealth (HOSPITAL_COMMUNITY): Payer: Self-pay

## 2019-08-16 DIAGNOSIS — I6522 Occlusion and stenosis of left carotid artery: Secondary | ICD-10-CM

## 2019-08-16 NOTE — Progress Notes (Signed)
HISTORY AND PHYSICAL     CC:  follow up. Requesting Provider:  Irena Reichmann, DO  HPI: This is a 69 y.o. female here for follow up for carotid artery stenosis.  Pt is s/p left CEA by Dr. Darrick Penna on 11/15/2007.    Pt was last seen 07/07/2017 by NP.  She has hx of TIA previous to CEA with sx of expressive aphasia, right hemiparesis, HA and transient left monocular vision loss.  At her last visit, she did have residual right leg and arm weakness when she gets overly tired but continued to improve with time.  Her left pupil remains dilated due to several corneal surgeries in the past.    She did not have any claudication sx at her last visit.    She has a hx of Raynaud's Dz with cold and numb feet and hands.   Her mother had hx of AAA and she tested for AAA with u/s and it measured 2.7cm in January 2020.    She presents today for follow up.  She states that she is doing well.  She has not had any numbness, weakness, clumsiness, speech difficulties or amaurosis fugax.  She states that if she is overly exhausted, she may notice slight right sided weakness, but states this has gotten better.  She does have Raynauds Dz and she has cold hands and feet.    The pt is on a statin for cholesterol management.  The pt is on a daily aspirin.   Other AC:  none The pt is on CCB for hypertension.   The pt is not diabetic.   Tobacco hx:  Remote quit 1990   Past Medical History:  Diagnosis Date  . Arthritis    in the neck  . Carotid artery occlusion   . Epilepsy (HCC)   . GERD (gastroesophageal reflux disease)   . Glaucoma   . Hyperlipidemia   . Hypertension   . Keratoconus of both eyes 1981  . Macular degeneration   . Migraines   . Neuromuscular disorder (HCC)    RA  . Peripheral vascular disease (HCC)    carotid occlusion surgery on left  . Raynaud's disease   . Retinal edema   . Rheumatoid arthritis(714.0)   . Stroke [facility])    two mild strokes presumed from left carotid stenosis     Past Surgical History:  Procedure Laterality Date  . CAROTID ARTERY ANGIOPLASTY  2008   Dr Madilyn Fireman  . CAROTID ENDARTERECTOMY Left 11-15-2007  . cataract extraction Right 04-2014   [facility]  . CHOLECYSTECTOMY  1995  . CORNEAL TRANSPLANT     x 5 ; steroid inj. retnal information  . CORNEAL TRANSPLANT Right 01-22-2014   [facility]  . 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 EYE SURGERY Right 12/2016   cornea repair   . EYE SURGERY Right 09/20/2017   cornea transplant   . GLAUCOMA SURGERY Right 2018  . TRIGGER FINGER RELEASE Right 05/16/2018   Procedure: RELEASE TRIGGER FINGER/A-1 PULLEY RIGHT THUMB;  Surgeon: Cindee Salt, MD;  Location: [facility];  Service: Orthopedics;  Laterality: Right;    Allergies  Allergen Reactions  . Morphine Other (See Comments)    Difficulty breathing  . Morphine And Related Shortness Of Breath  . Alphagan [Brimonidine]     Eyelid swelling, scratching Allergic to preservative contained in this med  . Codeine Nausea And Vomiting  . Dorzolamide Hcl-Timolol Mal     Inflammation of the eyelid, scratching Allergic to  preservative contained in this med  . Hydrocodone-Acetaminophen Nausea And Vomiting    But tolerates tylenol  . Sulfa Antibiotics Rash    "Large bumps"  . Sulfamethoxazole Rash    Current Outpatient Medications  Medication Sig Dispense Refill  . amLODipine (NORVASC) 5 MG tablet Take 5 mg by mouth daily.      . Ascorbic Acid 500 MG CAPS Take by mouth.    Marland Kitchen aspirin 325 MG tablet Take 325 mg by mouth daily.    Marland Kitchen atorvastatin (LIPITOR) 40 MG tablet Take 40 mg by mouth daily.      . botulinum toxin Type A (BOTOX) 100 units SOLR injection Inject IM into the head and neck muscles every 3 months by provider in office 2 vial 3  . butalbital-acetaminophen-caffeine (FIORICET, ESGIC) 50-325-40 MG tablet Take 1 tablet by mouth every 6 (six) hours as needed for headache. 10 tablet 4  . Calcium Carbonate-Vit D-Min (CALCIUM 1200  PO) Take 2 capsules by mouth daily.      . Cholecalciferol (VITAMIN D PO) Take 1,000 Units by mouth daily.     . COSOPT PF 2-0.5 % SOLN ophthalmic solution As directed    . DILANTIN 100 MG ER capsule TAKE 3 CAPSULES BY MOUTH DAILY 270 capsule 3  . Dorzolamide HCl-Timolol Mal (COSOPT OP) Cosopt (PF) 2 %-0.5 % eye drops in a dropperette    . ezetimibe (ZETIA) 10 MG tablet Take 10 mg by mouth daily.      . famotidine (PEPCID) 20 MG tablet Take 1 tablet (20 mg total) by mouth at bedtime. 90 tablet 1  . HUMIRA PEN 40 MG/0.4ML PNKT As directed    . leflunomide (ARAVA) 20 MG tablet Take 20 mg by mouth daily.    Marland Kitchen LORazepam (ATIVAN) 0.5 MG tablet Take 0.5 mg by mouth daily as needed for anxiety or sleep.     . pantoprazole (PROTONIX) 40 MG tablet Take 1 tablet (40 mg total) by mouth daily. 90 tablet 0  . prednisoLONE acetate (PRED FORTE) 1 % ophthalmic suspension Place 1-4 drops into both eyes daily. 2 TIMES DAILY DROPS INTO R EYE--6 TIMES DAILY DROP INTO L EYE    . promethazine (PHENERGAN) 25 MG tablet Take 1 tablet (25 mg total) by mouth every 6 (six) hours as needed for nausea or vomiting. 30 tablet 11  . SUMAtriptan (IMITREX) 6 MG/0.5ML SOLN injection Inject 0.5 mLs (6 mg total) into the skin as needed for migraine. 4 mL 11   Current Facility-Administered Medications  Medication Dose Route Frequency Provider Last Rate Last Admin  . botulinum toxin Type A (BOTOX) injection 200 Units  200 Units Intramuscular Once Ramond Marrow, DO      . botulinum toxin Type A (BOTOX) injection 200 Units  200 Units Intramuscular Once Ramond Marrow, DO      . botulinum toxin Type A (BOTOX) injection 200 Units  200 Units Intramuscular Once Ramond Marrow, DO      . botulinum toxin Type A (BOTOX) injection 200 Units  200 Units Intramuscular Once Ramond Marrow, DO      . pneumococcal 23 valent vaccine (PNU-IMMUNE) injection 0.5 mL  0.5 mL Intramuscular Once Nyoka Cowden, MD        Family History  Problem  Relation Age of Onset  . Heart disease Mother   . Lung disease Mother        ? disease process  . Uterine cancer Mother   . Heart disease Father   .  Clotting disorder Father   . Collagen disease Father   . Cervical cancer Maternal Aunt   . Prostate cancer Maternal Grandfather   . Rheum arthritis Sister   . High Cholesterol Sister   . Epilepsy Sister   . Rheum arthritis Maternal Grandmother   . High Cholesterol Brother   . High blood pressure Brother   . High blood pressure Brother   . High Cholesterol Brother   . High blood pressure Brother   . High Cholesterol Brother     Social History   Socioeconomic History  . Marital status: Married    Spouse name: [name]  . Number of children: 2  . Years of education: Bachelor  . Highest education level: Not on file  Occupational History  . Occupation: Runner, broadcasting/film/video  . Occupation: [employer] TEACHER    Employer: [employer]  Tobacco Use  . Smoking status: Former Smoker    Packs/day: 0.50    Years: 14.00    Pack years: 7.00    Types: Cigarettes    Quit date: 07/19/1988    Years since quitting: 31.0  . Smokeless tobacco: Never Used  Substance and Sexual Activity  . Alcohol use: No  . Drug use: No  . Sexual activity: Not on file  Other Topics Concern  . Not on file  Social History Narrative   Patient is married to [name]), has 2 children   Patient is right handed   Education level is Bachelor's degree   Caffeine consumption is 2 cups daily   Lives at home with husband    Social Determinants of Health   Financial Resource Strain:   . Difficulty of Paying Living Expenses: Not on file  Food Insecurity:   . Worried About Programme researcher, broadcasting/film/video in the Last Year: Not on file  . Ran Out of Food in the Last Year: Not on file  Transportation Needs:   . Lack of Transportation (Medical): Not on file  . Lack of Transportation (Non-Medical): Not on file  Physical Activity:   . Days of Exercise per Week: Not on file  . Minutes of  Exercise per Session: Not on file  Stress:   . Feeling of Stress : Not on file  Social Connections:   . Frequency of Communication with Friends and Family: Not on file  . Frequency of Social Gatherings with Friends and Family: Not on file  . Attends Religious Services: Not on file  . Active Member of Clubs or Organizations: Not on file  . Attends Banker Meetings: Not on file  . Marital Status: Not on file  Intimate Partner Violence:   . Fear of Current or Ex-Partner: Not on file  . Emotionally Abused: Not on file  . Physically Abused: Not on file  . Sexually Abused: Not on file     REVIEW OF SYSTEMS:   [X]  denotes positive finding, [ ]  denotes negative finding Cardiac  Comments:  Chest pain or chest pressure:    Shortness of breath upon exertion:    Short of breath when lying flat:    Irregular heart rhythm:        Vascular    Pain in calf, thigh, or hip brought on by ambulation:    Pain in feet at night that wakes you up from your sleep:     Blood clot in your veins:    Leg swelling:         Pulmonary    Oxygen at home:  Productive cough:     Wheezing:  x       Neurologic    Sudden weakness in arms or legs:     Sudden numbness in arms or legs:     Sudden onset of difficulty speaking or slurred speech:    Temporary loss of vision in one eye:     Problems with dizziness:         Gastrointestinal    Blood in stool:     Vomited blood:         Genitourinary    Burning when urinating:     Blood in urine:        Psychiatric    Major depression:         Hematologic    Bleeding problems:    Problems with blood clotting too easily:        Skin    Rashes or ulcers:        Constitutional    Fever or chills:      PHYSICAL EXAMINATION:  Today's Vitals   08/17/19 0839 08/17/19 0843  BP: (!) 164/94 (!) 172/100  Pulse: 73 74  Resp: 16   Weight: 128 lb (58.1 kg)   Height: 5' 2.5" (1.588 m)    Body mass index is 23.04 kg/m.   General:   WDWN in NAD; vital signs documented above Gait: Not observed HENT: WNL, normocephalic Pulmonary: normal non-labored breathing , without Rales, rhonchi,  wheezing Cardiac: regular HR, without  Murmurs, rubs or gallops; without carotid bruits Abdomen: soft, NT, no masses Skin: without rashes Vascular Exam/Pulses:  Right Left  Radial 2+ (normal) 2+ (normal)  Ulnar Unable to palpate  Unable to palpate   Popliteal Unable to palpate  Unable to palpate   PT 2+ (normal) 2+ (normal)   Extremities: without ischemic changes, without Gangrene , without cellulitis; without open wounds;  Musculoskeletal: no muscle wasting or atrophy  Neurologic: A&O X 3 Psychiatric:  The pt has Normal affect.   Non-Invasive Vascular Imaging:   Carotid Duplex on 08/17/2019: Right:  1-39% ICA stenosis Left:  No evidence of stenosis  Vertebrals: Bilateral vertebral arteries demonstrate antegrade flow.  Subclavians: Normal flow hemodynamics were seen in bilateral subclavian arteries.  Previous Carotid duplex on 07/07/17: Right: 1-39% ICA stenosis Left:  Patent left CEA site with no ICA stenosis  ABI's 08/17/2018: Right:  1.02/0.52 Left:  1.03/0.67  AAA duplex 08/17/2018: Abdominal Aorta Findings:  +-----------+-------+----------+----------+--------+--------+--------+  Location  AP (cm)Trans (cm)PSV (cm/s)WaveformThrombusComments  +-----------+-------+----------+----------+--------+--------+--------+  Proximal  2.70  2.62   57                  +-----------+-------+----------+----------+--------+--------+--------+  Mid    1.55  1.74   59                  +-----------+-------+----------+----------+--------+--------+--------+  Distal   1.55  1.37   69                  +-----------+-------+----------+----------+--------+--------+--------+  RT CIA Prox1.0  1.1    77                   +-----------+-------+----------+----------+--------+--------+--------+  LT CIA Prox1.0  1.0    89                  +-----------+-------+----------+----------+--------+--------+--------+  Incidental finding of >60% right renal artery stenosis with a peak  systolic velocity of 425 cm/sec at the origin.  Lower extremity duplex 08/17/2018: Summary:  Right: Near  normal examination. No evidence of significant arterial  dilatation. Diffuse, calcific plaque seen throughout the arteries.   Left: Near normal examination. No evidence of significant arterial  dilatation. Diffuse, calcific plaque seen throughout the arteries.     ASSESSMENT/PLAN:: 69 y.o. female here for follow up carotid artery stenosis.  -pt is doing well & remains asymptomatic.  She will continue her statin/asa. -she does have a 12 point difference of systolic pressure b/w the right and left.  She does not have any arm fatigue and both radial pulses are palpable.  -her carotid duplex is unchanged from December 2018 with no restenosis on the left and 1-39% on the right.   -discussed s/s of stroke with pt and they understand should they develop any of these sx, they will go to the nearest ER.   Leontine Locket, PA-C [facility] [phone]  Clinic MD:  Donzetta Matters

## 2019-08-16 NOTE — Telephone Encounter (Signed)

## 2019-08-17 ENCOUNTER — Other Ambulatory Visit: Payer: Self-pay

## 2019-08-17 ENCOUNTER — Ambulatory Visit: Payer: Medicare PPO

## 2019-08-17 ENCOUNTER — Ambulatory Visit (HOSPITAL_COMMUNITY)
Admission: RE | Admit: 2019-08-17 | Discharge: 2019-08-17 | Disposition: A | Discharge status: Home / Self Care | Payer: Medicare PPO | Source: Ambulatory Visit | Attending: Vascular Surgery | Admitting: Vascular Surgery

## 2019-08-17 ENCOUNTER — Ambulatory Visit (INDEPENDENT_AMBULATORY_CARE_PROVIDER_SITE_OTHER): Payer: Medicare PPO | Admitting: Physician Assistant

## 2019-08-17 VITALS — BP 172/100 | HR 74 | Resp 16 | Ht 62.5 in | Wt 128.0 lb

## 2019-08-17 DIAGNOSIS — I6522 Occlusion and stenosis of left carotid artery: Secondary | ICD-10-CM | POA: Diagnosis present

## 2019-08-20 ENCOUNTER — Other Ambulatory Visit: Payer: Self-pay | Admitting: *Deleted

## 2019-08-20 DIAGNOSIS — I6522 Occlusion and stenosis of left carotid artery: Secondary | ICD-10-CM

## 2019-08-24 ENCOUNTER — Ambulatory Visit: Payer: Medicare PPO | Attending: Internal Medicine

## 2019-08-24 DIAGNOSIS — Z23 Encounter for immunization: Secondary | ICD-10-CM

## 2019-08-24 NOTE — Progress Notes (Signed)
   Covid-19 Vaccination Clinic  Name:  Brianna [name]    MRN: [id number] DOB: 1951/04/10  08/24/2019  Brianna [name] was observed post Covid-19 immunization for 15 minutes without incidence. She was provided with Vaccine Information Sheet and instruction to access the V-Safe system.   Brianna [name] was instructed to call 911 with any severe reactions post vaccine: Marland Kitchen Difficulty breathing  . Swelling of your face and throat  . A fast heartbeat  . A bad rash all over your body  . Dizziness and weakness    Immunizations Administered    Name Date Dose VIS Date Route   Pfizer COVID-19 Vaccine 08/24/2019  8:40 AM 0.3 mL 06/29/2019 Intramuscular   Manufacturer: [employer], [employer]   Lot: [id number]   NDC: [address]-2

## 2019-09-03 ENCOUNTER — Ambulatory Visit: Payer: Medicare PPO

## 2019-09-18 ENCOUNTER — Ambulatory Visit: Payer: Medicare PPO | Attending: Internal Medicine

## 2019-09-18 DIAGNOSIS — Z23 Encounter for immunization: Secondary | ICD-10-CM | POA: Insufficient documentation

## 2019-09-18 NOTE — Progress Notes (Signed)
   Covid-19 Vaccination Clinic  Name:  Brianna [name]    MRN: [id number] DOB: [birth year]  09/18/2019  [name] was observed post Covid-19 immunization for 15 minutes without incident. She was provided with Vaccine Information Sheet and instruction to access the V-Safe system.   [name] was instructed to call 911 with any severe reactions post vaccine: Marland Kitchen Difficulty breathing  . Swelling of face and throat  . A fast heartbeat  . A bad rash all over body  . Dizziness and weakness   Immunizations Administered    Name Date Dose VIS Date Route   Pfizer COVID-19 Vaccine 09/18/2019  8:11 AM 0.3 mL 06/29/2019 Intramuscular   Manufacturer: [employer], [employer]   Lot: [id number]   NDC: [address]-1

## 2019-10-04 NOTE — Progress Notes (Signed)
Office Visit Note  [name]             Date of Birth: 10-17-1950           MRN: [id number]             PCP: Irena Reichmann, DO Referring: Irena Reichmann, DO Visit Date: 10/12/2019 Occupation: @GUAROCC @  Subjective:  Medication monitoring  History of Present Illness: Brianna [name] is a 69 y.o. female with history of seropositive rheumatoid arthritis, ILD, and iritis.  She denies any recent rheumatoid arthritis flares.  She is on Humira 40 mg subcutaneous injections every 14 days, Arava 20 mg daily, and cyclosporine as prescribed by Dr. 71.  She continues to see Dr. Sherryll Burger for management of iritis of the right eye.  She uses several eyedrops daily for management of glaucoma and is followed by Dr. Sherryll Burger.  She reports that she sees Dr. Work on a yearly basis and things have been stable.  She denies any new or worsening pulmonary symptoms.  She denies any joint pain or joint swelling at this time.  She continues to have morning stiffness lasting about 15 minutes daily.  She has no new concerns at this time.  Activities of Daily Living:  Patient reports morning stiffness for 15 minutes.   Patient Denies nocturnal pain.  Difficulty dressing/grooming: Denies Difficulty climbing stairs: Denies Difficulty getting out of chair: Denies Difficulty using hands for taps, buttons, cutlery, and/or writing: Denies  Review of Systems  Constitutional: Positive for fatigue.  HENT: Positive for mouth dryness. Negative for mouth sores and nose dryness.   Eyes: Positive for dryness. Negative for pain and redness.  Respiratory: Negative for shortness of breath and difficulty breathing.   Cardiovascular: Negative for chest pain and palpitations.  Gastrointestinal: Negative for blood in stool, constipation and diarrhea.  Endocrine: Negative for increased urination.  Genitourinary: Negative for difficulty urinating.  Musculoskeletal: Positive for morning stiffness. Negative for arthralgias,  joint pain and joint swelling.  Skin: Positive for color change. Negative for rash and redness.  Allergic/Immunologic: Negative for susceptible to infections.  Neurological: Positive for headaches. Negative for dizziness, numbness, memory loss and weakness.  Hematological: Positive for bruising/bleeding tendency.  Psychiatric/Behavioral: Negative for confusion.    PMFS History:  Patient Active Problem List   Diagnosis Date Noted  . URI, acute 05/19/2018  . Cerumen impaction 05/19/2018  . Trigger thumb, right thumb 10/05/2017  . History of macular degeneration 09/13/2016  . History of migraine 09/06/2016  . History of seizures 09/06/2016  . Iritis of right eye 05/12/2016  . Rheumatoid arthritis with positive rheumatoid factor (HCC) 05/11/2016  . 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  . High risk medication use 05/11/2016  . Osteoporosis 05/11/2016  . 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 History of carotid endarterectomy 06/27/2014  . Migraine without aura 04/19/2013  . Seizures (HCC) 04/19/2013  . 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 Cough 01/14/2011  . Pulmonary fibrosis (HCC) 01/14/2011    Past Medical History:  Diagnosis Date  . Arthritis    in the neck  . Carotid artery occlusion   . Epilepsy (HCC)   . GERD (gastroesophageal reflux disease)   . Glaucoma   . Hyperlipidemia   . Hypertension   . Keratoconus of both eyes 1981  . Macular degeneration   . Migraines   . Neuromuscular disorder (HCC)    RA  . Peripheral vascular disease (HCC)    carotid occlusion surgery on  left  . Raynaud's disease   . Retinal edema   . Rheumatoid arthritis(714.0)   . Stroke [facility])    two mild strokes presumed from left carotid stenosis    Family History  Problem Relation Age of Onset  . Heart disease Mother   . Lung disease Mother        ? disease process  . Uterine cancer Mother   . Heart disease Father   . Clotting disorder Father     . Collagen disease Father   . Cervical cancer Maternal Aunt   . Prostate cancer Maternal Grandfather   . Rheum arthritis Sister   . High Cholesterol Sister   . Epilepsy Sister   . Rheum arthritis Maternal Grandmother   . High Cholesterol Brother   . High blood pressure Brother   . High blood pressure Brother   . High Cholesterol Brother   . High blood pressure Brother   . High Cholesterol Brother    Past Surgical History:  Procedure Laterality Date  . CAROTID ARTERY ANGIOPLASTY  2008   Dr Amedeo Plenty  . CAROTID ENDARTERECTOMY Left 11-15-2007  . cataract extraction Right 04-2014   [place]  . CORNEAL TRANSPLANT     x 5 ; steroid inj. retnal information  . CORNEAL TRANSPLANT Right 01-22-2014   [facility]  . 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 EYE SURGERY Right 12/2016   cornea repair   . EYE SURGERY Right 09/20/2017   cornea transplant   . GLAUCOMA SURGERY Right 2018  . TRIGGER FINGER RELEASE Right 05/16/2018   Procedure: RELEASE TRIGGER FINGER/A-1 PULLEY RIGHT THUMB;  Surgeon: Daryll Brod, MD;  Location: [place];  Service: Orthopedics;  Laterality: Right;   Social History   Social History Narrative   Patient is married to Thayer Jew), has 2 children   Patient is right handed   Education level is Bachelor's degree   Caffeine consumption is 2 cups daily   Lives at home with husband    Immunization History  Administered Date(s) Administered  . Influenza Split 04/26/2013, 04/08/2017  . Influenza, High Dose Seasonal PF 04/09/2018  . PFIZER SARS-COV-2 Vaccination 08/24/2019, 09/18/2019  . Pneumococcal Conjugate-13 08/27/2013  . Pneumococcal Polysaccharide-23 01/14/2011  . Tdap 11/15/2017     Objective: Vital Signs: BP (!) 161/88 (BP Location: Left Arm, Patient Position: Sitting, Cuff Size: Normal)   Pulse 77   Resp 13   Ht 5\' 2"  (1.575 m)   Wt 128 lb 9.6 oz (58.3 kg)   BMI 23.52 kg/m    Physical Exam Vitals and nursing  note reviewed.  Constitutional:      Appearance: She is well-developed.  HENT:     Head: Normocephalic and atraumatic.  Eyes:     Conjunctiva/sclera: Conjunctivae normal.  Pulmonary:     Effort: Pulmonary effort is normal.  Abdominal:     General: Bowel sounds are normal.     Palpations: Abdomen is soft.  Musculoskeletal:     Cervical back: Normal range of motion.  Lymphadenopathy:     Cervical: No cervical adenopathy.  Skin:    General: Skin is warm and dry.     Capillary Refill: Capillary refill takes less than 2 seconds.  Neurological:     Mental Status: She is alert and oriented to person, place, and time.  Psychiatric:        Behavior: Behavior normal.      Musculoskeletal Exam: C-spine, thoracic spine, and lumbar spine good  ROM.  Shoulder joints, elbow joints, wrist joints, MCPs, PIPs, and DIPs good ROM with no synovitis.  Complete fist formation bilaterally. Right 1st and 2nd MCP joint. PIP thickening consistent with osteoarthritis. Hip joints, knee joints, ankle joints, MTPs, PIPs, and DIPs good ROM with no synovitis.  No warmth or effusion of knee joints.  No tenderness or swelling of ankle joints.  No MTP joint tenderness.     CDAI Exam: CDAI Score: -- Patient Global: --; Provider Global: -- Swollen: --; Tender: -- Joint Exam 10/12/2019   No joint exam has been documented for this visit   There is currently no information documented on the homunculus. Go to the Rheumatology activity and complete the homunculus joint exam.  Investigation: No additional findings.  Imaging: No results found.  Recent Labs: Lab Results  Component Value Date   WBC 7.9 12/13/2018   HGB 14.1 12/13/2018   PLT 294 12/13/2018   NA 140 12/13/2018   K 4.5 12/13/2018   CL 102 12/13/2018   CO2 29 12/13/2018   GLUCOSE 130 (H) 12/13/2018   BUN 13 12/13/2018   CREATININE 0.60 12/13/2018   BILITOT 0.4 12/13/2018   ALKPHOS 110 02/10/2017   AST 29 12/13/2018   ALT 19 12/13/2018    PROT 7.3 12/13/2018   ALBUMIN 4.4 02/10/2017   CALCIUM 9.3 12/13/2018   GFRAA 110 12/13/2018   QFTBGOLDPLUS NEGATIVE 05/31/2018    Speciality Comments: Last Reclast 11/30/2017   Procedures:  No procedures performed Allergies: Morphine, Morphine and related, Alphagan [brimonidine], Codeine, Dorzolamide hcl-timolol mal, Hydrocodone-acetaminophen, Sulfa antibiotics, and Sulfamethoxazole   Assessment / Plan:     Visit Diagnoses: Rheumatoid arthritis involving multiple sites with positive rheumatoid factor (HCC): She has no synovitis on exam today.  She has not had any recent rheumatoid arthritis flares.  She is clinically doing well on Humira 40 mg sq injections every 14 days  and Arava 10 mg 2 tablets daily.  She has not experiencing any joint pain or inflammation at this time.  She experiences morning stiffness for about 15 minutes daily.  She will continue on the current treatment regimen.  She is advised to notify us if she develops increased joint pain or joint swelling.  She will follow-up in the office in 5 months.  High risk medication use - Cyclosporin 50 mg daily, Humira 40 mg every 14 days and Arava 10 mg 2 tablets daily.  TB gold negative on 01/30/2019.  CBC and CMP were stable on 09/13/2019.  She will be due to update lab work in May and every 3 months to monitor for drug toxicity.  ILD (interstitial lung disease) (HCC) - She is followed by Dr. Sherene Sires.  She has been clinically stable and her PFTs were stable on 06/22/2019.  Chest x-ray did not reveal any acute cardiopulmonary disease.  Stable bibasilar interstitial changes compatible with known fibrosis noted on chest x-ray from 06/22/2019.  The chest x-ray was compared with an x-ray from 03/19/2019.  She will continue to follow-up with Dr. Sherene Sires yearly.  She was advised to notify Dr. Sherene Sires as well as [place] if she develops increased shortness of breath or new pulmonary symptoms.  Iritis of right eye - She is followed by Dr.Shah, Dr. Dan Humphreys, and Dr.  Lottie Dawson. She is on Humira 40 mg subcutaneous injections every 14 days, which is prescribed by Dr. Sherryll Burger.  She was evaluated by Dr. Salley Slaughter on 07/31/2019 and according to his office visit note he recommended continuing on cyclosporine, Humira, and [place]  as prescribed.  DDD (degenerative disc disease), lumbar: She has no lower back pain at this time.   Age-related osteoporosis without current pathological fracture - DEXA was on 10/08/2016 which revealed 1/3 left forearm T-score -3.5. Updated DEXA ordered 12/13/18. She is overdue to update DEXA.  She is taking a calcium and vitamin D supplement.  Trochanteric bursitis of both hips: She has no tenderness to palpation on exam today.  Other medical conditions are listed as follows:  History of carotid endarterectomy  History of seizures  History of macular degeneration  History of glaucoma  History of migraine  Orders: No orders of the defined types were placed in this encounter.  No orders of the defined types were placed in this encounter.     Follow-Up Instructions: Return in about 5 months (around 03/13/2020) for Rheumatoid arthritis, ILD, iritis .   Gearldine Bienenstock, PA-C   I examined and evaluated the patient with Sherron Ales PA.  Patient had no synovitis on my examination.  She is doing well on current combination.  The plan of care was discussed as noted above.  Pollyann Savoy, MD  Note - This record has been created using Animal nutritionist.  Chart creation errors have been sought, but may not always  have been located. Such creation errors do not reflect on  the standard of medical care.

## 2019-10-12 ENCOUNTER — Ambulatory Visit: Payer: Medicare Other | Admitting: Rheumatology

## 2019-10-12 ENCOUNTER — Encounter: Payer: Self-pay | Admitting: Rheumatology

## 2019-10-12 ENCOUNTER — Other Ambulatory Visit: Payer: Self-pay

## 2019-10-12 VITALS — BP 161/88 | HR 77 | Resp 13 | Ht 62.0 in | Wt 128.6 lb

## 2019-10-12 DIAGNOSIS — M5136 Other intervertebral disc degeneration, lumbar region: Secondary | ICD-10-CM

## 2019-10-12 DIAGNOSIS — Z87898 Personal history of other specified conditions: Secondary | ICD-10-CM

## 2019-10-12 DIAGNOSIS — M7061 Trochanteric bursitis, right hip: Secondary | ICD-10-CM

## 2019-10-12 DIAGNOSIS — M0579 Rheumatoid arthritis with rheumatoid factor of multiple sites without organ or systems involvement: Secondary | ICD-10-CM

## 2019-10-12 DIAGNOSIS — M51369 Other intervertebral disc degeneration, lumbar region without mention of lumbar back pain or lower extremity pain: Secondary | ICD-10-CM

## 2019-10-12 DIAGNOSIS — J849 Interstitial pulmonary disease, unspecified: Secondary | ICD-10-CM

## 2019-10-12 DIAGNOSIS — H209 Unspecified iridocyclitis: Secondary | ICD-10-CM | POA: Diagnosis not present

## 2019-10-12 DIAGNOSIS — Z79899 Other long term (current) drug therapy: Secondary | ICD-10-CM | POA: Diagnosis not present

## 2019-10-12 DIAGNOSIS — M810 Age-related osteoporosis without current pathological fracture: Secondary | ICD-10-CM

## 2019-10-12 DIAGNOSIS — Z8669 Personal history of other diseases of the nervous system and sense organs: Secondary | ICD-10-CM

## 2019-10-12 DIAGNOSIS — M7062 Trochanteric bursitis, left hip: Secondary | ICD-10-CM

## 2019-10-12 DIAGNOSIS — Z9889 Other specified postprocedural states: Secondary | ICD-10-CM

## 2019-10-22 ENCOUNTER — Other Ambulatory Visit: Payer: Self-pay | Admitting: *Deleted

## 2019-10-22 DIAGNOSIS — R059 Cough, unspecified: Secondary | ICD-10-CM

## 2019-10-22 DIAGNOSIS — R05 Cough: Secondary | ICD-10-CM

## 2019-10-22 MED ORDER — FAMOTIDINE 20 MG PO TABS: 20.0000 mg | ORAL_TABLET | Freq: Every day | ORAL | 1 refills | Status: DC

## 2019-10-25 ENCOUNTER — Telehealth: Payer: Self-pay | Admitting: Neurology

## 2019-10-25 NOTE — Telephone Encounter (Signed)
BOTOX 10/25/2019 B/B [id number] NPR REF # [id number] Auth # [id number]  08/02/2019 - 06/30/2020 Reff# [id number] Arma Heading

## 2019-11-02 ENCOUNTER — Other Ambulatory Visit: Payer: Self-pay | Admitting: Internal Medicine

## 2019-11-02 MED ORDER — PANTOPRAZOLE SODIUM 40 MG PO TBEC: 40.0000 mg | DELAYED_RELEASE_TABLET | Freq: Every day | ORAL | 2 refills | Status: DC

## 2019-11-06 ENCOUNTER — Ambulatory Visit: Payer: Self-pay | Admitting: Neurology

## 2019-11-08 ENCOUNTER — Ambulatory Visit (INDEPENDENT_AMBULATORY_CARE_PROVIDER_SITE_OTHER): Payer: Medicare PPO | Admitting: Neurology

## 2019-11-08 ENCOUNTER — Other Ambulatory Visit: Payer: Self-pay

## 2019-11-08 VITALS — BP 200/99 | HR 80 | Temp 96.8°F

## 2019-11-08 DIAGNOSIS — G43719 Chronic migraine without aura, intractable, without status migrainosus: Secondary | ICD-10-CM

## 2019-11-08 NOTE — Progress Notes (Signed)
Interval history 11/08/2019: Las botox she had only 4 migraine days a month taken care of by imitrex, significant.  >70% improvement in frequency of migraines, she was thrilled. Baseline 15 severemigraine days a month and daily headaches. No masseters.Unfortunately her blood pressure has been elevated systolic > 941 and unfortunately this is causing her distress and more headaches., Discuss with your doctor at next appointment: Can they place you on propranolol, atenolol? Good for migraines as well.Or any beta blocker? Other blood pressure medication emerging to be very good for migraines is candesartan   Also if needed.Dry needling: cervical myofascial pain, Send back to dry needling - [address] for cervical myofascial pain syndrome. Gave samples for Nurtec for her to try as well acutely at last visit.   Tried: VPA, Topamax, antidepressants, norvasc, phenergan, frova, aimovig, ajovy, fioricet, (save 6 As), verapamil     Consent Form Botulism Toxin Injection For Chronic Migraine    Reviewed orally with patient, additionally signature is on file:  Botulism toxin has been approved by the Federal drug administration for treatment of chronic migraine. Botulism toxin does not cure chronic migraine and it may not be effective in some patients.  The administration of botulism toxin is accomplished by injecting a small amount of toxin into the muscles of the neck and head. Dosage must be titrated for each individual. Any benefits resulting from botulism toxin tend to wear off after 3 months with a repeat injection required if benefit is to be maintained. Injections are usually done every 3-4 months with maximum effect peak achieved by about 2 or 3 weeks. Botulism toxin is expensive and you should be sure of what costs you will incur resulting from the injection.  The side effects of botulism toxin use for chronic migraine may include:   -Transient, and usually mild, facial weakness with facial  injections  -Transient, and usually mild, head or neck weakness with head/neck injections  -Reduction or loss of forehead facial animation due to forehead muscle weakness  -Eyelid drooping  -Dry eye  -Pain at the site of injection or bruising at the site of injection  -Double vision  -Potential unknown long term risks  Contraindications: You should not have Botox if you are pregnant, nursing, allergic to albumin, have an infection, skin condition, or muscle weakness at the site of the injection, or have myasthenia gravis, Lambert-Eaton syndrome, or ALS.  It is also possible that as with any injection, there may be an allergic reaction or no effect from the medication. Reduced effectiveness after repeated injections is sometimes seen and rarely infection at the injection site may occur. All care will be taken to prevent these side effects. If therapy is given over a long time, atrophy and wasting in the muscle injected may occur. Occasionally the patient's become refractory to treatment because they develop antibodies to the toxin. In this event, therapy needs to be modified.  I have read the above information and consent to the administration of botulism toxin.    BOTOX PROCEDURE NOTE FOR MIGRAINE HEADACHE    Contraindications and precautions discussed with patient(above). Aseptic procedure was observed and patient tolerated procedure. Procedure performed by Dr. Georgia Dom  The condition has existed for more than 6 months, and pt does not have a diagnosis of ALS, Myasthenia Gravis or Lambert-Eaton Syndrome.  Risks and benefits of injections discussed and pt agrees to proceed with the procedure.  Written consent obtained  These injections are medically necessary. Pt  receives good benefits from these  injections. These injections do not cause sedations or hallucinations which the oral therapies may cause.  Description of procedure:  The patient was placed in a sitting position. The  standard protocol was used for Botox as follows, with 5 units of Botox injected at each site:   -Procerus muscle, midline injection DID NOT PERFORM  -Corrugator muscle, bilateral injection DID NOT PERFORM  -Frontalis muscle, bilateral injection, with 2 sites each side, medial injection was performed in the upper one third of the frontalis muscle, in the region vertical from the medial inferior edge of the superior orbital rim. The lateral injection was again in the upper one third of the forehead vertically above the lateral limbus of the cornea, 1.5 cm lateral to the medial injection site.  -Temporalis muscle injection, 4 sites, bilaterally. The first injection was 3 cm above the tragus of the ear, second injection site was 1.5 cm to 3 cm up from the first injection site in line with the tragus of the ear. The third injection site was 1.5-3 cm forward between the first 2 injection sites. The fourth injection site was 1.5 cm posterior to the second injection site.   -Occipitalis muscle injection, 3 sites, bilaterally. The first injection was done one half way between the occipital protuberance and the tip of the mastoid process behind the ear. The second injection site was done lateral and superior to the first, 1 fingerbreadth from the first injection. The third injection site was 1 fingerbreadth superiorly and medially from the first injection site.  -Cervical paraspinal muscle injection, 2 sites, bilateral knee first injection site was 1 cm from the midline of the cervical spine, 3 cm inferior to the lower border of the occipital protuberance. The second injection site was 1.5 cm superiorly and laterally to the first injection site.  -Trapezius muscle injection was performed at 3 sites, bilaterally. The first injection site was in the upper trapezius muscle halfway between the inflection point of the neck, and the acromion. The second injection site was one half way between the acromion and the  first injection site. The third injection was done between the first injection site and the inflection point of the neck.   Will return for repeat injection in 3 months.   200 units of Botox was used, any Botox not injected was wasted. The patient tolerated the procedure well, there were no complications of the above procedure.

## 2019-11-08 NOTE — Progress Notes (Signed)
Botox- 100 units x 2 vials Lot: [id number] Expiration: 01/2022 NDC: 7711-6579-03  Bacteriostatic 0.9% Sodium Chloride- 63mL total Lot: [id number] Expiration: 01/17/2020 NDC: 2919-1660-60  Dx: O45.997 B/B

## 2019-11-08 NOTE — Patient Instructions (Signed)
Can they place you on propranolol, atenolol? Good for migraines as well.Or any beta blocker? Other blood pressure medication emerging to be very good for migraines is candesartan   Atenolol Tablets What is this medicine? ATENOLOL (a TEN oh lole) is a beta blocker. It decreases the amount of work your heart has to do and helps your heart beat regularly. It treats high blood pressure and/or prevent chest pain (also called angina). It is also used after a heart attack to prevent a second one. This medicine may be used for other purposes; ask your health care provider or pharmacist if you have questions. COMMON BRAND NAME(S): Tenormin What should I tell my health care provider before I take this medicine? They need to know if you have any of these conditions:  diabetes  heart or vessel disease like slow heart rate, worsening heart failure, heart block, sick sinus syndrome or Raynaud's disease  kidney disease  lung or breathing disease, like asthma or emphysema  pheochromocytoma  thyroid disease  an unusual or allergic reaction to atenolol, other beta-blockers, medicines, foods, dyes, or preservatives  pregnant or trying to get pregnant  breast-feeding How should I use this medicine? Take this drug by mouth. Take it as directed on the prescription label at the same time every day. You can take it with or without food. If it upsets your stomach, take it with food. Keep taking it unless your health care provider tells you to stop. Talk to your health care provider about the use of this drug in children. Special care may be needed. Overdosage: If you think you have taken too much of this medicine contact a poison control center or emergency room at once. NOTE: This medicine is only for you. Do not share this medicine with others. What if I miss a dose? If you miss a dose, take it as soon as you can. If it is almost time for your next dose, take only that dose. Do not take double or extra  doses. What may interact with this medicine? This medicine may interact with the following medications:  certain medicines for blood pressure, heart disease, irregular heart beat  clonidine  digoxin  diuretics  dobutamine  epinephrine  isoproterenol  NSAIDs, medicines for pain and inflammation, like ibuprofen or naproxen  reserpine This list may not describe all possible interactions. Give your health care provider a list of all the medicines, herbs, non-prescription drugs, or dietary supplements you use. Also tell them if you smoke, drink alcohol, or use illegal drugs. Some items may interact with your medicine. What should I watch for while using this medicine? Visit your doctor or health care professional for regular check ups. Check your blood pressure and pulse rate regularly. Ask your health care professional what your blood pressure and pulse rate should be, and when you should contact him or her. You may get drowsy or dizzy. Do not drive, use machinery, or do anything that needs mental alertness until you know how this medicine affects you. Do not stand or sit up quickly. Alcohol may interfere with the effect of this medicine. Avoid alcoholic drinks. This medicine may increase blood sugar. Ask your healthcare provider if changes in diet or medicines are needed if you have diabetes. Do not treat yourself for coughs, colds, or pain while you are taking this medicine without asking your doctor or health care professional for advice. Some ingredients may increase your blood pressure. What side effects may I notice from receiving this medicine?  Side effects that you should report to your doctor or health care professional as soon as possible:  allergic reactions like skin rash, itching or hives, swelling of the face, lips, or tongue  breathing problems  changes in vision  chest pain  cold, tingling, or numb hands or feet  depression  fast, irregular heartbeat  feeling  faint or lightheaded, falls  fever with sore throat  rapid weight gain   signs and symptoms of high blood sugar such as being more thirsty or hungry or having to urinate more than normal. You may also feel very tired or have blurry vision.  swollen ankles, legs Side effects that usually do not require medical attention (report to your doctor or health care professional if they continue or are bothersome):  anxiety, nervous  diarrhea  dry skin  change in sex drive or performance  headache  nightmares or trouble sleeping  short term memory loss  stomach upset  unusually tired This list may not describe all possible side effects. Call your doctor for medical advice about side effects. You may report side effects to FDA at 1-800-FDA-1088. Where should I keep my medicine? Keep out of the reach of children and pets. Store at room temperature between 20 and 25 degrees C (68 and 77 degrees F). Throw away any unused drug after the expiration date. NOTE: This sheet is a summary. It may not cover all possible information. If you have questions about this medicine, talk to your doctor, pharmacist, or health care provider.  2020 Elsevier/Gold Standard (2019-02-15 15:07:07)

## 2019-11-13 DIAGNOSIS — G40909 Epilepsy, unspecified, not intractable, without status epilepticus: Secondary | ICD-10-CM | POA: Insufficient documentation

## 2019-11-13 DIAGNOSIS — N390 Urinary tract infection, site not specified: Secondary | ICD-10-CM | POA: Insufficient documentation

## 2019-12-21 DIAGNOSIS — H353221 Exudative age-related macular degeneration, left eye, with active choroidal neovascularization: Secondary | ICD-10-CM | POA: Diagnosis not present

## 2019-12-21 DIAGNOSIS — Z79899 Other long term (current) drug therapy: Secondary | ICD-10-CM | POA: Diagnosis not present

## 2019-12-21 DIAGNOSIS — H30033 Focal chorioretinal inflammation, peripheral, bilateral: Secondary | ICD-10-CM | POA: Diagnosis not present

## 2019-12-21 DIAGNOSIS — H3581 Retinal edema: Secondary | ICD-10-CM | POA: Diagnosis not present

## 2019-12-21 DIAGNOSIS — H401112 Primary open-angle glaucoma, right eye, moderate stage: Secondary | ICD-10-CM | POA: Diagnosis not present

## 2019-12-21 DIAGNOSIS — H401123 Primary open-angle glaucoma, left eye, severe stage: Secondary | ICD-10-CM | POA: Diagnosis not present

## 2019-12-21 DIAGNOSIS — Z947 Corneal transplant status: Secondary | ICD-10-CM | POA: Diagnosis not present

## 2020-01-02 DIAGNOSIS — H3321 Serous retinal detachment, right eye: Secondary | ICD-10-CM | POA: Diagnosis not present

## 2020-01-02 DIAGNOSIS — Z885 Allergy status to narcotic agent status: Secondary | ICD-10-CM | POA: Diagnosis not present

## 2020-01-02 DIAGNOSIS — H401112 Primary open-angle glaucoma, right eye, moderate stage: Secondary | ICD-10-CM | POA: Diagnosis not present

## 2020-01-02 DIAGNOSIS — Z882 Allergy status to sulfonamides status: Secondary | ICD-10-CM | POA: Diagnosis not present

## 2020-01-02 DIAGNOSIS — Z886 Allergy status to analgesic agent status: Secondary | ICD-10-CM | POA: Diagnosis not present

## 2020-01-02 DIAGNOSIS — Z947 Corneal transplant status: Secondary | ICD-10-CM | POA: Diagnosis not present

## 2020-01-02 DIAGNOSIS — Z9841 Cataract extraction status, right eye: Secondary | ICD-10-CM | POA: Diagnosis not present

## 2020-01-02 DIAGNOSIS — Z961 Presence of intraocular lens: Secondary | ICD-10-CM | POA: Diagnosis not present

## 2020-01-02 DIAGNOSIS — Z888 Allergy status to other drugs, medicaments and biological substances status: Secondary | ICD-10-CM | POA: Diagnosis not present

## 2020-01-02 DIAGNOSIS — H401132 Primary open-angle glaucoma, bilateral, moderate stage: Secondary | ICD-10-CM | POA: Diagnosis not present

## 2020-01-02 DIAGNOSIS — H401123 Primary open-angle glaucoma, left eye, severe stage: Secondary | ICD-10-CM | POA: Diagnosis not present

## 2020-01-04 DIAGNOSIS — H30033 Focal chorioretinal inflammation, peripheral, bilateral: Secondary | ICD-10-CM | POA: Diagnosis not present

## 2020-01-04 DIAGNOSIS — H401112 Primary open-angle glaucoma, right eye, moderate stage: Secondary | ICD-10-CM | POA: Diagnosis not present

## 2020-01-04 DIAGNOSIS — Z79899 Other long term (current) drug therapy: Secondary | ICD-10-CM | POA: Diagnosis not present

## 2020-01-04 DIAGNOSIS — H401123 Primary open-angle glaucoma, left eye, severe stage: Secondary | ICD-10-CM | POA: Diagnosis not present

## 2020-01-04 DIAGNOSIS — Z947 Corneal transplant status: Secondary | ICD-10-CM | POA: Diagnosis not present

## 2020-01-04 DIAGNOSIS — H353221 Exudative age-related macular degeneration, left eye, with active choroidal neovascularization: Secondary | ICD-10-CM | POA: Diagnosis not present

## 2020-01-04 DIAGNOSIS — H3581 Retinal edema: Secondary | ICD-10-CM | POA: Diagnosis not present

## 2020-01-17 ENCOUNTER — Other Ambulatory Visit: Payer: Self-pay

## 2020-01-17 ENCOUNTER — Telehealth: Payer: Self-pay | Admitting: *Deleted

## 2020-01-17 DIAGNOSIS — R059 Cough, unspecified: Secondary | ICD-10-CM

## 2020-01-17 MED ORDER — FAMOTIDINE 20 MG PO TABS: 20.0000 mg | ORAL_TABLET | Freq: Every day | ORAL | 1 refills | Status: DC

## 2020-01-17 NOTE — Telephone Encounter (Signed)
Received request for clearance from Dr. Lucia Gaskins for dental surgery. Per Dr. Lucia Gaskins, pt cleared, if NPO she should still take her seizure medications. The request form had pt's name but not DOB. I called Dr. Sherri Rad office to confirm the pt and then faxed over the clearance form. Received a receipt of confirmation.

## 2020-01-18 DIAGNOSIS — H401123 Primary open-angle glaucoma, left eye, severe stage: Secondary | ICD-10-CM | POA: Diagnosis not present

## 2020-01-18 DIAGNOSIS — H401112 Primary open-angle glaucoma, right eye, moderate stage: Secondary | ICD-10-CM | POA: Diagnosis not present

## 2020-01-18 DIAGNOSIS — Z79899 Other long term (current) drug therapy: Secondary | ICD-10-CM | POA: Diagnosis not present

## 2020-01-25 DIAGNOSIS — I10 Essential (primary) hypertension: Secondary | ICD-10-CM | POA: Diagnosis not present

## 2020-01-25 DIAGNOSIS — E785 Hyperlipidemia, unspecified: Secondary | ICD-10-CM | POA: Diagnosis not present

## 2020-01-25 DIAGNOSIS — H04129 Dry eye syndrome of unspecified lacrimal gland: Secondary | ICD-10-CM | POA: Diagnosis not present

## 2020-01-25 DIAGNOSIS — M1990 Unspecified osteoarthritis, unspecified site: Secondary | ICD-10-CM | POA: Diagnosis not present

## 2020-01-25 DIAGNOSIS — Z8673 Personal history of transient ischemic attack (TIA), and cerebral infarction without residual deficits: Secondary | ICD-10-CM | POA: Diagnosis not present

## 2020-01-25 DIAGNOSIS — Z87891 Personal history of nicotine dependence: Secondary | ICD-10-CM | POA: Diagnosis not present

## 2020-01-25 DIAGNOSIS — Z882 Allergy status to sulfonamides status: Secondary | ICD-10-CM | POA: Diagnosis not present

## 2020-01-25 DIAGNOSIS — Z7982 Long term (current) use of aspirin: Secondary | ICD-10-CM | POA: Diagnosis not present

## 2020-01-25 DIAGNOSIS — Z885 Allergy status to narcotic agent status: Secondary | ICD-10-CM | POA: Diagnosis not present

## 2020-01-25 DIAGNOSIS — Z947 Corneal transplant status: Secondary | ICD-10-CM | POA: Diagnosis not present

## 2020-01-25 DIAGNOSIS — G43909 Migraine, unspecified, not intractable, without status migrainosus: Secondary | ICD-10-CM | POA: Diagnosis not present

## 2020-01-25 DIAGNOSIS — K219 Gastro-esophageal reflux disease without esophagitis: Secondary | ICD-10-CM | POA: Diagnosis not present

## 2020-01-25 DIAGNOSIS — I739 Peripheral vascular disease, unspecified: Secondary | ICD-10-CM | POA: Diagnosis not present

## 2020-01-25 DIAGNOSIS — G40909 Epilepsy, unspecified, not intractable, without status epilepticus: Secondary | ICD-10-CM | POA: Diagnosis not present

## 2020-02-05 ENCOUNTER — Ambulatory Visit: Payer: Medicare PPO | Admitting: Neurology

## 2020-02-12 ENCOUNTER — Ambulatory Visit: Payer: Medicare PPO | Admitting: Neurology

## 2020-02-12 DIAGNOSIS — G43719 Chronic migraine without aura, intractable, without status migrainosus: Secondary | ICD-10-CM | POA: Diagnosis not present

## 2020-02-12 NOTE — Progress Notes (Signed)
Botox- 200 units x 1 vial Lot: [id number] Expiration: 10/2022 NDC: 0023-3921-02  Bacteriostatic 0.9% Sodium Chloride- 4mL total Lot: [id number] Expiration: 04/18/2021 NDC: 0409-1966-02  Dx: G43.719 B/B  

## 2020-02-12 NOTE — Patient Instructions (Signed)
Discuss with your doctor at next appointment: Can they place you on propranolol, atenolol? Good for migraines as well.Or any beta blocker? Other blood pressure medication emerging to be very good for migraines is candesartan

## 2020-02-12 NOTE — Progress Notes (Signed)
Interval history 02/12/2020: Migraines fluctuate with her eye problems, she sees cornea specialists and retina specialists and they may install long term steroid release long term in the eyes. Las botox she had only 4 migraine days a month taken care of by imitrex, significant.  >70% improvement in frequency of migraines, Baseline 15 severemigraine days a month and daily headaches. No masseters.  She absolutely loved  .Dry needling: cervical myofascial pain, Send back to dry needling - [address] for cervical myofascial pain syndrome. Gave samples for Nurtec for her to try as well acutely at last visit but she did not like it.  Tried: VPA, Topamax, antidepressants, norvasc, phenergan, frova, aimovig, ajovy, fioricet, (save 6 As), verapamil     Consent Form Botulism Toxin Injection For Chronic Migraine    Reviewed orally with patient, additionally signature is on file:  Botulism toxin has been approved by the Federal drug administration for treatment of chronic migraine. Botulism toxin does not cure chronic migraine and it may not be effective in some patients.  The administration of botulism toxin is accomplished by injecting a small amount of toxin into the muscles of the neck and head. Dosage must be titrated for each individual. Any benefits resulting from botulism toxin tend to wear off after 3 months with a repeat injection required if benefit is to be maintained. Injections are usually done every 3-4 months with maximum effect peak achieved by about 2 or 3 weeks. Botulism toxin is expensive and you should be sure of what costs you will incur resulting from the injection.  The side effects of botulism toxin use for chronic migraine may include:   -Transient, and usually mild, facial weakness with facial injections  -Transient, and usually mild, head or neck weakness with head/neck injections  -Reduction or loss of forehead facial animation due to forehead muscle weakness  -Eyelid  drooping  -Dry eye  -Pain at the site of injection or bruising at the site of injection  -Double vision  -Potential unknown long term risks  Contraindications: You should not have Botox if you are pregnant, nursing, allergic to albumin, have an infection, skin condition, or muscle weakness at the site of the injection, or have myasthenia gravis, Lambert-Eaton syndrome, or ALS.  It is also possible that as with any injection, there may be an allergic reaction or no effect from the medication. Reduced effectiveness after repeated injections is sometimes seen and rarely infection at the injection site may occur. All care will be taken to prevent these side effects. If therapy is given over a long time, atrophy and wasting in the muscle injected may occur. Occasionally the patient's become refractory to treatment because they develop antibodies to the toxin. In this event, therapy needs to be modified.  I have read the above information and consent to the administration of botulism toxin.    BOTOX PROCEDURE NOTE FOR MIGRAINE HEADACHE    Contraindications and precautions discussed with patient(above). Aseptic procedure was observed and patient tolerated procedure. Procedure performed by Dr. Artemio Aly  The condition has existed for more than 6 months, and pt does not have a diagnosis of ALS, Myasthenia Gravis or Lambert-Eaton Syndrome.  Risks and benefits of injections discussed and pt agrees to proceed with the procedure.  Written consent obtained  These injections are medically necessary. Pt  receives good benefits from these injections. These injections do not cause sedations or hallucinations which the oral therapies may cause.  Description of procedure:  The patient was placed in  a sitting position. The standard protocol was used for Botox as follows, with 5 units of Botox injected at each site:   -Procerus muscle, midline injection DID NOT PERFORM  -Corrugator muscle, bilateral  injection DID NOT PERFORM  -Frontalis muscle, bilateral injection, with 2 sites each side, medial injection was performed in the upper one third of the frontalis muscle, in the region vertical from the medial inferior edge of the superior orbital rim. The lateral injection was again in the upper one third of the forehead vertically above the lateral limbus of the cornea, 1.5 cm lateral to the medial injection site.  -Temporalis muscle injection, 4 sites, bilaterally. The first injection was 3 cm above the tragus of the ear, second injection site was 1.5 cm to 3 cm up from the first injection site in line with the tragus of the ear. The third injection site was 1.5-3 cm forward between the first 2 injection sites. The fourth injection site was 1.5 cm posterior to the second injection site.   -Occipitalis muscle injection, 3 sites, bilaterally. The first injection was done one half way between the occipital protuberance and the tip of the mastoid process behind the ear. The second injection site was done lateral and superior to the first, 1 fingerbreadth from the first injection. The third injection site was 1 fingerbreadth superiorly and medially from the first injection site.  -Cervical paraspinal muscle injection, 2 sites, bilateral knee first injection site was 1 cm from the midline of the cervical spine, 3 cm inferior to the lower border of the occipital protuberance. The second injection site was 1.5 cm superiorly and laterally to the first injection site.  -Trapezius muscle injection was performed at 3 sites, bilaterally. The first injection site was in the upper trapezius muscle halfway between the inflection point of the neck, and the acromion. The second injection site was one half way between the acromion and the first injection site. The third injection was done between the first injection site and the inflection point of the neck.   Will return for repeat injection in 3 months.   200 units  of Botox was used, any Botox not injected was wasted. The patient tolerated the procedure well, there were no complications of the above procedure.

## 2020-02-15 DIAGNOSIS — H3581 Retinal edema: Secondary | ICD-10-CM | POA: Diagnosis not present

## 2020-02-15 DIAGNOSIS — Z5181 Encounter for therapeutic drug level monitoring: Secondary | ICD-10-CM | POA: Diagnosis not present

## 2020-02-15 DIAGNOSIS — H401130 Primary open-angle glaucoma, bilateral, stage unspecified: Secondary | ICD-10-CM | POA: Diagnosis not present

## 2020-02-15 DIAGNOSIS — H30102 Unspecified disseminated chorioretinal inflammation, left eye: Secondary | ICD-10-CM | POA: Insufficient documentation

## 2020-02-15 DIAGNOSIS — Z947 Corneal transplant status: Secondary | ICD-10-CM | POA: Diagnosis not present

## 2020-02-15 DIAGNOSIS — T868402 Corneal transplant rejection, left eye: Secondary | ICD-10-CM | POA: Diagnosis not present

## 2020-02-15 DIAGNOSIS — H353221 Exudative age-related macular degeneration, left eye, with active choroidal neovascularization: Secondary | ICD-10-CM | POA: Diagnosis not present

## 2020-02-15 DIAGNOSIS — H30033 Focal chorioretinal inflammation, peripheral, bilateral: Secondary | ICD-10-CM | POA: Diagnosis not present

## 2020-02-15 DIAGNOSIS — Z79899 Other long term (current) drug therapy: Secondary | ICD-10-CM | POA: Diagnosis not present

## 2020-02-18 ENCOUNTER — Ambulatory Visit: Payer: Medicare PPO | Admitting: Rheumatology

## 2020-02-18 NOTE — Progress Notes (Signed)
Office Visit Note  [name]             Date of Birth: 1951/02/06           MRN: [id number]             PCP: Irena Reichmann, DO Referring: Irena Reichmann, DO Visit Date: 02/19/2020 Occupation: @GUAROCC @  Subjective:  Discuss DEXA results   History of Present Illness: MIGNA HOUDEK is a 69 y.o. female with history of seropositive rheumatoid arthritis, iritis, ILD, osteoporosis. She is currently on Humira 40 mg sq injections once every 14 days, Arava 10 mg 2 tablets daily, and cyclosporin 50 mg daily. She denies any rheumatoid arthritis flares.  She has stiffness in her neck and lower back but denies any other joint pain or joint swelling.   She presents today to discuss DEXA results from 11/13/19. She was previously on Reclast. She has been apprehensive to restart any medications for osteoporosis due to upcoming dental work.   She denies any worsening SOB or coughing. She is followed by Dr. Sherene Sires on a yearly basis.    Activities of Daily Living:  Patient reports morning stiffness for 10  minutes.   Patient Denies nocturnal pain.  Difficulty dressing/grooming: Denies Difficulty climbing stairs: Denies Difficulty getting out of chair: Denies Difficulty using hands for taps, buttons, cutlery, and/or writing: Denies  Review of Systems  Constitutional: Negative for fatigue.  HENT: Negative for mouth sores, mouth dryness and nose dryness.   Eyes: Positive for dryness. Negative for redness and itching.  Respiratory: Negative for shortness of breath and difficulty breathing.   Cardiovascular: Negative for chest pain and palpitations.  Gastrointestinal: Negative for blood in stool, constipation and diarrhea.  Endocrine: Negative for increased urination.  Genitourinary: Negative for difficulty urinating.  Musculoskeletal: Positive for arthralgias, joint pain and morning stiffness. Negative for joint swelling, myalgias, muscle tenderness and myalgias.  Skin: Positive for  color change. Negative for rash and redness.  Allergic/Immunologic: Negative for susceptible to infections.  Neurological: Positive for headaches. Negative for dizziness, numbness, memory loss and weakness.  Hematological: Positive for bruising/bleeding tendency.  Psychiatric/Behavioral: Negative for confusion.    PMFS History:  Patient Active Problem List   Diagnosis Date Noted   URI, acute 05/19/2018   Cerumen impaction 05/19/2018   Trigger thumb, right thumb 10/05/2017   History of macular degeneration 09/13/2016   History of migraine 09/06/2016   History of seizures 09/06/2016   Iritis of right eye 05/12/2016   Rheumatoid arthritis with positive rheumatoid factor (HCC) 05/11/2016   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   High risk medication use 05/11/2016   Osteoporosis 05/11/2016   Intractable chronic migraine without aura and without status migrainosus 04/02/2016   History of carotid endarterectomy 06/27/2014   Migraine without aura 04/19/2013   Seizures (HCC) 04/19/2013   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   Cough 01/14/2011   Pulmonary fibrosis (HCC) 01/14/2011    Past Medical History:  Diagnosis Date   Arthritis    in the neck   Carotid artery occlusion    Epilepsy (HCC)    GERD (gastroesophageal reflux disease)    Glaucoma    Hyperlipidemia    Hypertension    Keratoconus of both eyes 1981   Macular degeneration    Migraines    Neuromuscular disorder (HCC)    RA   Peripheral vascular disease (HCC)    carotid occlusion surgery on left   Raynaud's disease    Retinal edema    Rheumatoid  arthritis(714.0)    Stroke [facility])    two mild strokes presumed from left carotid stenosis    Family History  Problem Relation Age of Onset   Heart disease Mother    Lung disease Mother        ? disease process   Uterine cancer Mother    Heart disease Father    Clotting disorder Father    Collagen disease Father    Cervical cancer  Maternal Aunt    Prostate cancer Maternal Grandfather    Rheum arthritis Sister    High Cholesterol Sister    Epilepsy Sister    Rheum arthritis Maternal Grandmother    High Cholesterol Brother    High blood pressure Brother    High blood pressure Brother    High Cholesterol Brother    High blood pressure Brother    High Cholesterol Brother    Past Surgical History:  Procedure Laterality Date   CAROTID ARTERY ANGIOPLASTY  2008   Dr Madilyn Fireman   CAROTID ENDARTERECTOMY Left 11-15-2007   cataract extraction Right 04-2014   [facility]   CHOLECYSTECTOMY  1995   CORNEAL TRANSPLANT     x 5 ; steroid inj. 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   [facility]   CORNEAL TRANSPLANT Left 04/17/2019   EYE SURGERY Right 12/2016   cornea repair    EYE SURGERY Right 09/20/2017   cornea transplant    GLAUCOMA SURGERY Right 2018   TRIGGER FINGER RELEASE Right 05/16/2018   Procedure: RELEASE TRIGGER FINGER/A-1 PULLEY RIGHT THUMB;  Surgeon: Cindee Salt, MD;  Location: [facility];  Service: Orthopedics;  Laterality: Right;   Social History   Social History Narrative   Patient is married to [name]), has 2 children   Patient is right handed   Education level is Bachelor's degree   Caffeine consumption is 2 cups daily   Lives at home with husband    Immunization History  Administered Date(s) Administered   Influenza Split 04/26/2013, 04/08/2017   Influenza, High Dose Seasonal PF 04/09/2018   PFIZER SARS-COV-2 Vaccination 08/24/2019, 09/18/2019   Pneumococcal Conjugate-13 08/27/2013   Pneumococcal Polysaccharide-23 01/14/2011   Tdap 11/15/2017     Objective: Vital Signs: BP (!) 187/101 (BP Location: Right Arm, Patient Position: Sitting, Cuff Size: Normal)    Pulse 88    Resp 15    Ht 5\' 2"  (1.575 m)    Wt 124 lb 9.6 oz (56.5 kg)    BMI 22.79 kg/m    Physical Exam Vitals and nursing note reviewed.  Constitutional:       Appearance: She is well-developed.  HENT:     Head: Normocephalic and atraumatic.  Eyes:     Conjunctiva/sclera: Conjunctivae normal.  Cardiovascular:     Rate and Rhythm: Normal rate and regular rhythm.     Heart sounds: Normal heart sounds.  Pulmonary:     Effort: Pulmonary effort is normal.     Comments: Crackles in bilateral lung bases Abdominal:     General: Bowel sounds are normal.     Palpations: Abdomen is soft.  Musculoskeletal:     Cervical back: Normal range of motion.  Lymphadenopathy:     Cervical: No cervical adenopathy.  Skin:    General: Skin is warm and dry.     Capillary Refill: Capillary refill takes less than 2 seconds.  Neurological:     Mental Status: She is alert and oriented to person, place, and time.  Psychiatric:  Behavior: Behavior normal.      Musculoskeletal Exam: C-spine limited ROM.  Shoulder joints, elbow joints, wrist joints, MCPs, PIPs, and DIPs good ROM with no synovitis.  PIP and DIP thickening consistent with osteoarthritis of both hands.  Hip joints, knee joints, and ankle joints good ROM with no discomfort.  No warmth or effusion of knee joints.  No tenderness or swelling of ankle joints.   CDAI Exam: CDAI Score: -- Patient Global: --; Provider Global: -- Swollen: --; Tender: -- Joint Exam 02/19/2020   No joint exam has been documented for this visit   There is currently no information documented on the homunculus. Go to the Rheumatology activity and complete the homunculus joint exam.  Investigation: No additional findings.  Imaging: XR Foot 2 Views Left  Result Date: 02/19/2020 First MTP, PIP and DIP narrowing was noted.  No intertarsal or tibiotalar joint space narrowing was noted.  Inferior calcaneal spur was noted.  No radiographic progression was noted when compared to the x-rays of 2018. Impression: These findings are consistent with osteoarthritis.  XR Foot 2 Views Right  Result Date: 02/19/2020 First MTP, PIP and  DIP narrowing was noted.  No intertarsal or tibiotalar joint space narrowing was noted.  Inferior calcaneal spur was noted.  No radiographic progression was noted when compared to the x-rays of 2018. Impression: These findings are consistent with osteoarthritis.  XR Hand 2 View Left  Result Date: 02/19/2020 CMC, PIP and DIP narrowing was noted.  No intercarpal or radiocarpal joint space narrowing was noted.  No MCP narrowing was noted.  Juxta-articular osteopenia was noted.  No radiographic progression was noted when compared to the films of 2018.  No erosive changes were noted. Impression: These findings are consistent with rheumatoid arthritis and osteoarthritis overlap.  XR Hand 2 View Right  Result Date: 02/19/2020 Juxta-articular osteopenia was noted.    PIP and DIP narrowing was noted.  No significant intercarpal or radiocarpal joint space narrowing was noted.  No erosive changes were noted.  No radiographic progression was noted when compared to the x-rays of February 10, 2017. Impression: These findings are consistent with rheumatoid arthritis and osteoarthritis overlap.   Recent Labs: Lab Results  Component Value Date   WBC 7.9 12/13/2018   HGB 14.1 12/13/2018   PLT 294 12/13/2018   NA 140 12/13/2018   K 4.5 12/13/2018   CL 102 12/13/2018   CO2 29 12/13/2018   GLUCOSE 130 (H) 12/13/2018   BUN 13 12/13/2018   CREATININE 0.60 12/13/2018   BILITOT 0.4 12/13/2018   ALKPHOS 110 02/10/2017   AST 29 12/13/2018   ALT 19 12/13/2018   PROT 7.3 12/13/2018   ALBUMIN 4.4 02/10/2017   CALCIUM 9.3 12/13/2018   GFRAA 110 12/13/2018   QFTBGOLDPLUS NEGATIVE 05/31/2018    Speciality Comments: Last Reclast 11/30/2017   Procedures:  No procedures performed Allergies: Morphine, Morphine and related, Alphagan [brimonidine], Codeine, Dorzolamide hcl-timolol mal, Hydrocodone-acetaminophen, Sulfa antibiotics, and Sulfamethoxazole   Assessment / Plan:     Visit Diagnoses: Rheumatoid arthritis  involving multiple sites with positive rheumatoid factor (HCC) -She has no tenderness or synovitis on exam.  She has not had any signs or symptoms of a rheumatoid arthritis flare.  She is clinically doing well on Humira 40 mg subcutaneous injections every 14 days and Arava 10 mg 2 tablets daily.  She has not experienced any joint pain or inflammation at this time.  She does experience joint stiffness in her lower back intermittently.  X-rays  of both hands and feet were obtained today to assess radiographic progression.  No progression or erosive changes were noted.  She was called with the results of her x-rays and all questions were addressed. She will continue on the current treatment regimen.  She was advised to notify us if she develops increased joint pain or joint swelling.  She will follow up in 5 months. Plan: XR Hand 2 View Right, XR Hand 2 View Left, XR Foot 2 Views Right, XR Foot 2 Views Left.  No radiographic progression was noted.  X-rays were consistent with rheumatoid arthritis and osteoarthritis overlap in bilateral hands and osteoarthritic changes in bilateral feet.  High risk medication use - Cyclosporin 50 mg daily, Humira 40 mg sq every 14 days and Arava 10 mg 2 tablets daily. CBC and CMP were drawn on 02/15/20 and were reviewed today in the office.  TB gold negative on 02/15/20.   She was advised to hold these above medications if she develops signs or symptoms of an infection and to resume once the infection has completely cleared.  She has received both COVID-19 vaccinations.  We discussed the importance of continuing to wear her mask and social distance.  We also discussed that if she contracts COVID-19 infection she is to notify us or her PCP in order to receive the COVID-19 antibody infusion.  ILD (interstitial lung disease) (HCC) - She is followed by Dr. Sherene Sires.  She has been clinically stable and her PFTs were stable on 06/22/2019.  We will proceed with scheduling a high-resolution chest  CT for further evaluation.  She has been experiencing intermittent wheezing.  Crackles were auscultated in bilateral lung bases.  Iritis of right eye - followed by Dr.Shah, Dr. Dan Humphreys, and Dr. Lottie Dawson. She is on Humira 40 mg subcutaneous injections every 14 days, which is prescribed by Dr. Sherryll Burger.  DDD (degenerative disc disease), lumbar: She experiences intermittent lower back pain and stiffness.  No symptoms of radiculopathy.   Age-related osteoporosis without current pathological fracture - DEXA was on 10/08/2016 which revealed 1/3 left forearm T-score -3.5. Most recent DEXA on 11/13/19: L Left femoral neck T-score -3.0, Right FN T-score -2.6, distal 1/2 left radius T-score -3.3.  DEXA results were reviewed today.  She has been taking calcium and vitamin D.  Resistive exercises were recommended.  Her last reclast infusion was on 11/30/17. She has been apprehensive to proceed with treatment since she has been undergoing dental work. We discussed different treatment options today.  She is in agreement to try Forteo daily sq injections.  Indications, contraindications, and potential side effects of forteo were discussed today. We will apply for Forteo sq daily injections.  We will obtain lab work today prior to starting her.  CBC and CMP were obtained on 02/15/20.  Repeat DEXA in April 2023.    Trochanteric bursitis of both hips: Resolved.   Other medical conditions are listed as follows:   History of seizures  History of glaucoma  History of carotid endarterectomy  History of macular degeneration  History of migraine  Orders: Orders Placed This Encounter  Procedures   XR Hand 2 View Right   XR Hand 2 View Left   XR Foot 2 Views Right   XR Foot 2 Views Left   CT Chest High Resolution   VITAMIN D 25 Hydroxy (Vit-D Deficiency, Fractures)   Phosphorus   Serum protein electrophoresis with reflex   Parathyroid hormone, intact (no Ca)   TSH   No orders of  the defined types were placed  in this encounter.   Face-to-face time spent with patient was 30 minutes. Greater than 50% of time was spent in counseling and coordination of care.  Follow-Up Instructions: Return in 5 months (on 07/21/2020) for Rheumatoid arthritis.   Pollyann Savoy, MD   Scribed by-  Sherron Ales ,PA-C  Note - This record has been created using Dragon software.  Chart creation errors have been sought, but may not always  have been located. Such creation errors do not reflect on  the standard of medical care.

## 2020-02-18 NOTE — Progress Notes (Deleted)
Office Visit Note  [name]             Date of Birth: [birth year]           MRN: [id number]             PCP: Irena Reichmann, DO Referring: Irena Reichmann, DO Visit Date: 02/18/2020 Occupation: @GUAROCC @  Subjective:  No chief complaint on file.   History of Present Illness: Brianna [name] is a 69 y.o. female ***   Activities of Daily Living:  Patient reports morning stiffness for *** {minute/hour:19697}.   Patient {ACTIONS;DENIES/REPORTS:21021675::"Denies"} nocturnal pain.  Difficulty dressing/grooming: {ACTIONS;DENIES/REPORTS:21021675::"Denies"} Difficulty climbing stairs: {ACTIONS;DENIES/REPORTS:21021675::"Denies"} Difficulty getting out of chair: {ACTIONS;DENIES/REPORTS:21021675::"Denies"} Difficulty using hands for taps, buttons, cutlery, and/or writing: {ACTIONS;DENIES/REPORTS:21021675::"Denies"}  No Rheumatology ROS completed.   PMFS History:  Patient Active Problem List   Diagnosis Date Noted  . URI, acute 05/19/2018  . Cerumen impaction 05/19/2018  . Trigger thumb, right thumb 10/05/2017  . History of macular degeneration 09/13/2016  . History of migraine 09/06/2016  . History of seizures 09/06/2016  . Iritis of right eye 05/12/2016  . Rheumatoid arthritis with positive rheumatoid factor (HCC) 05/11/2016  . 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  . High risk medication use 05/11/2016  . Osteoporosis 05/11/2016  . 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 History of carotid endarterectomy 06/27/2014  . Migraine without aura 04/19/2013  . Seizures (HCC) 04/19/2013  . 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 Cough 01/14/2011  . Pulmonary fibrosis (HCC) 01/14/2011    Past Medical History:  Diagnosis Date  . Arthritis    in the neck  . Carotid artery occlusion   . Epilepsy (HCC)   . GERD (gastroesophageal reflux disease)   . Glaucoma   . Hyperlipidemia   . Hypertension   . Keratoconus of both eyes 1981  .  Macular degeneration   . Migraines   . Neuromuscular disorder (HCC)    RA  . Peripheral vascular disease (HCC)    carotid occlusion surgery on left  . Raynaud's disease   . Retinal edema   . Rheumatoid arthritis(714.0)   . Stroke [facility])    two mild strokes presumed from left carotid stenosis    Family History  Problem Relation Age of Onset  . Heart disease Mother   . Lung disease Mother        ? disease process  . Uterine cancer Mother   . Heart disease Father   . Clotting disorder Father   . Collagen disease Father   . Cervical cancer Maternal Aunt   . Prostate cancer Maternal Grandfather   . Rheum arthritis Sister   . High Cholesterol Sister   . Epilepsy Sister   . Rheum arthritis Maternal Grandmother   . High Cholesterol Brother   . High blood pressure Brother   . High blood pressure Brother   . High Cholesterol Brother   . High blood pressure Brother   . High Cholesterol Brother    Past Surgical History:  Procedure Laterality Date  . CAROTID ARTERY ANGIOPLASTY  2008   Dr 2009  . CAROTID ENDARTERECTOMY Left 11-15-2007  . cataract extraction Right 04-2014   [facility]  . CHOLECYSTECTOMY  1995  . CORNEAL TRANSPLANT     x 5 ; steroid inj. retnal information  . 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 EYE SURGERY Right 12/2016   cornea repair   . EYE  SURGERY Right 09/20/2017   cornea transplant   . GLAUCOMA SURGERY Right 2018  . TRIGGER FINGER RELEASE Right 05/16/2018   Procedure: RELEASE TRIGGER FINGER/A-1 PULLEY RIGHT THUMB;  Surgeon: Cindee Salt, MD;  Location: [facility];  Service: Orthopedics;  Laterality: Right;   Social History   Social History Narrative   Patient is married to [name]), has 2 children   Patient is right handed   Education level is Bachelor's degree   Caffeine consumption is 2 cups daily   Lives at home with husband    Immunization History  Administered Date(s)  Administered  . Influenza Split 04/26/2013, 04/08/2017  . Influenza, High Dose Seasonal PF 04/09/2018  . PFIZER SARS-COV-2 Vaccination 08/24/2019, 09/18/2019  . Pneumococcal Conjugate-13 08/27/2013  . Pneumococcal Polysaccharide-23 01/14/2011  . Tdap 11/15/2017     Objective: Vital Signs: There were no vitals taken for this visit.   Physical Exam   Musculoskeletal Exam: ***  CDAI Exam: CDAI Score: -- Patient Global: --; Provider Global: -- Swollen: --; Tender: -- Joint Exam 02/18/2020   No joint exam has been documented for this visit   There is currently no information documented on the homunculus. Go to the Rheumatology activity and complete the homunculus joint exam.  Investigation: No additional findings.  Imaging: No results found.  Recent Labs: Lab Results  Component Value Date   WBC 7.9 12/13/2018   HGB 14.1 12/13/2018   PLT 294 12/13/2018   NA 140 12/13/2018   K 4.5 12/13/2018   CL 102 12/13/2018   CO2 29 12/13/2018   GLUCOSE 130 (H) 12/13/2018   BUN 13 12/13/2018   CREATININE 0.60 12/13/2018   BILITOT 0.4 12/13/2018   ALKPHOS 110 02/10/2017   AST 29 12/13/2018   ALT 19 12/13/2018   PROT 7.3 12/13/2018   ALBUMIN 4.4 02/10/2017   CALCIUM 9.3 12/13/2018   GFRAA 110 12/13/2018   QFTBGOLDPLUS NEGATIVE 05/31/2018    Speciality Comments: Last Reclast 11/30/2017   Procedures:  No procedures performed Allergies: Morphine, Morphine and related, Alphagan [brimonidine], Codeine, Dorzolamide hcl-timolol mal, Hydrocodone-acetaminophen, Sulfa antibiotics, and Sulfamethoxazole   Assessment / Plan:     Visit Diagnoses: No diagnosis found.  Orders: No orders of the defined types were placed in this encounter.  No orders of the defined types were placed in this encounter.   Face-to-face time spent with patient was *** minutes. Greater than 50% of time was spent in counseling and coordination of care.  Follow-Up Instructions: No follow-ups on  file.   Ellen Henri, CMA  Note - This record has been created using Animal nutritionist.  Chart creation errors have been sought, but may not always  have been located. Such creation errors do not reflect on  the standard of medical care.

## 2020-02-19 ENCOUNTER — Ambulatory Visit: Payer: Self-pay

## 2020-02-19 ENCOUNTER — Encounter: Payer: Self-pay | Admitting: Rheumatology

## 2020-02-19 ENCOUNTER — Other Ambulatory Visit: Payer: Self-pay

## 2020-02-19 ENCOUNTER — Ambulatory Visit: Payer: Medicare PPO | Admitting: Rheumatology

## 2020-02-19 VITALS — BP 187/101 | HR 88 | Resp 15 | Ht 62.0 in | Wt 124.6 lb

## 2020-02-19 DIAGNOSIS — M79671 Pain in right foot: Secondary | ICD-10-CM

## 2020-02-19 DIAGNOSIS — M79672 Pain in left foot: Secondary | ICD-10-CM

## 2020-02-19 DIAGNOSIS — M7061 Trochanteric bursitis, right hip: Secondary | ICD-10-CM | POA: Diagnosis not present

## 2020-02-19 DIAGNOSIS — M810 Age-related osteoporosis without current pathological fracture: Secondary | ICD-10-CM

## 2020-02-19 DIAGNOSIS — H209 Unspecified iridocyclitis: Secondary | ICD-10-CM | POA: Diagnosis not present

## 2020-02-19 DIAGNOSIS — M79642 Pain in left hand: Secondary | ICD-10-CM | POA: Diagnosis not present

## 2020-02-19 DIAGNOSIS — M79641 Pain in right hand: Secondary | ICD-10-CM | POA: Diagnosis not present

## 2020-02-19 DIAGNOSIS — J849 Interstitial pulmonary disease, unspecified: Secondary | ICD-10-CM | POA: Diagnosis not present

## 2020-02-19 DIAGNOSIS — Z87898 Personal history of other specified conditions: Secondary | ICD-10-CM

## 2020-02-19 DIAGNOSIS — M0579 Rheumatoid arthritis with rheumatoid factor of multiple sites without organ or systems involvement: Secondary | ICD-10-CM | POA: Diagnosis not present

## 2020-02-19 DIAGNOSIS — M5136 Other intervertebral disc degeneration, lumbar region: Secondary | ICD-10-CM | POA: Diagnosis not present

## 2020-02-19 DIAGNOSIS — Z79899 Other long term (current) drug therapy: Secondary | ICD-10-CM | POA: Diagnosis not present

## 2020-02-19 DIAGNOSIS — Z9889 Other specified postprocedural states: Secondary | ICD-10-CM

## 2020-02-19 DIAGNOSIS — M7062 Trochanteric bursitis, left hip: Secondary | ICD-10-CM

## 2020-02-19 DIAGNOSIS — Z8669 Personal history of other diseases of the nervous system and sense organs: Secondary | ICD-10-CM

## 2020-02-19 DIAGNOSIS — M51369 Other intervertebral disc degeneration, lumbar region without mention of lumbar back pain or lower extremity pain: Secondary | ICD-10-CM

## 2020-02-19 NOTE — Patient Instructions (Addendum)
[email or web address]   COVID-19 vaccine recommendations:   COVID-19 vaccine is recommended for everyone (unless you are allergic to a vaccine component), even if you are on a medication that suppresses your immune system.   If you are on Methotrexate, Cellcept (mycophenolate), Rinvoq, Harriette Ohara, and Olumiant- hold the medication for 1 week after each vaccine. Hold Methotrexate for 2 weeks after the single dose COVID-19 vaccine.   If you are on Orencia subcutaneous injection - hold medication one week prior to and one week after the first COVID-19 vaccine dose (only).   If you are on Orencia IV infusions- time vaccination administration so that the first COVID-19 vaccination will occur four weeks after the infusion and postpone the subsequent infusion by one week.   If you are on Cyclophosphamide or Rituxan infusions please contact your doctor prior to receiving the COVID-19 vaccine.   Do not take Tylenol or ant anti-inflammatory medications (NSAIDs) 24 hours prior to the COVID-19 vaccination.   There is no direct evidence about the efficacy of the COVID-19 vaccine in individuals who are on medications that suppress the immune system.   Even if you are fully vaccinated, and you are on any medications that suppress your immune system, please continue to wear a mask, maintain at least six feet social distance and practice hand hygiene.   If you develop a COVID-19 infection, please contact your PCP or our office to determine if you need antibody infusion.  We anticipate that a booster vaccine will be available soon for immunosuppressed individuals. Please cal our office before receiving your booster dose to make adjustments to your medication regimen.

## 2020-02-20 NOTE — Progress Notes (Signed)
Vitamin D WNL.  Please recommend a maintenance dose of vitamin D.  Phosphorus WNL.

## 2020-02-21 LAB — PARATHYROID HORMONE, INTACT (NO CA): PTH: 65 pg/mL — ABNORMAL HIGH (ref 14–64)

## 2020-02-21 LAB — PROTEIN ELECTROPHORESIS, SERUM, WITH REFLEX
Albumin ELP: 4.6 g/dL (ref 3.8–4.8)
Alpha 1: 0.3 g/dL (ref 0.2–0.3)
Alpha 2: 1 g/dL — ABNORMAL HIGH (ref 0.5–0.9)
Beta 2: 0.5 g/dL (ref 0.2–0.5)
Beta Globulin: 0.4 g/dL (ref 0.4–0.6)
Gamma Globulin: 1.3 g/dL (ref 0.8–1.7)
Total Protein: 8.1 g/dL (ref 6.1–8.1)

## 2020-02-21 LAB — PHOSPHORUS: Phosphorus: 4.3 mg/dL (ref 2.1–4.3)

## 2020-02-21 LAB — VITAMIN D 25 HYDROXY (VIT D DEFICIENCY, FRACTURES): Vit D, 25-Hydroxy: 42 ng/mL (ref 30–100)

## 2020-02-22 NOTE — Progress Notes (Signed)
PTH borderline elevated.  Phosphorus is WNL.  SPEP revealed isolated alpha 2 globulins suggestive of acute inflammation.

## 2020-03-03 ENCOUNTER — Other Ambulatory Visit: Payer: Self-pay

## 2020-03-03 ENCOUNTER — Ambulatory Visit (HOSPITAL_COMMUNITY)
Admission: RE | Admit: 2020-03-03 | Discharge: 2020-03-03 | Disposition: A | Discharge status: Home / Self Care | Payer: Medicare PPO | Source: Ambulatory Visit | Attending: Physician Assistant | Admitting: Physician Assistant

## 2020-03-03 DIAGNOSIS — I2510 Atherosclerotic heart disease of native coronary artery without angina pectoris: Secondary | ICD-10-CM | POA: Diagnosis not present

## 2020-03-03 DIAGNOSIS — Z9841 Cataract extraction status, right eye: Secondary | ICD-10-CM | POA: Diagnosis not present

## 2020-03-03 DIAGNOSIS — H401112 Primary open-angle glaucoma, right eye, moderate stage: Secondary | ICD-10-CM | POA: Diagnosis not present

## 2020-03-03 DIAGNOSIS — J84112 Idiopathic pulmonary fibrosis: Secondary | ICD-10-CM | POA: Diagnosis not present

## 2020-03-03 DIAGNOSIS — Z8669 Personal history of other diseases of the nervous system and sense organs: Secondary | ICD-10-CM | POA: Diagnosis not present

## 2020-03-03 DIAGNOSIS — I700 Atherosclerosis of aorta: Secondary | ICD-10-CM | POA: Diagnosis not present

## 2020-03-03 DIAGNOSIS — H3321 Serous retinal detachment, right eye: Secondary | ICD-10-CM | POA: Diagnosis not present

## 2020-03-03 DIAGNOSIS — J479 Bronchiectasis, uncomplicated: Secondary | ICD-10-CM | POA: Diagnosis not present

## 2020-03-03 DIAGNOSIS — H30033 Focal chorioretinal inflammation, peripheral, bilateral: Secondary | ICD-10-CM | POA: Diagnosis not present

## 2020-03-03 DIAGNOSIS — H401123 Primary open-angle glaucoma, left eye, severe stage: Secondary | ICD-10-CM | POA: Diagnosis not present

## 2020-03-03 DIAGNOSIS — Z947 Corneal transplant status: Secondary | ICD-10-CM | POA: Diagnosis not present

## 2020-03-03 DIAGNOSIS — Z961 Presence of intraocular lens: Secondary | ICD-10-CM | POA: Diagnosis not present

## 2020-03-03 DIAGNOSIS — J849 Interstitial pulmonary disease, unspecified: Secondary | ICD-10-CM | POA: Diagnosis not present

## 2020-03-04 ENCOUNTER — Telehealth: Payer: Self-pay | Admitting: Rheumatology

## 2020-03-04 DIAGNOSIS — I2721 Secondary pulmonary arterial hypertension: Secondary | ICD-10-CM

## 2020-03-04 NOTE — Telephone Encounter (Signed)
Patient called requesting a return call to discuss the results of her CT.  Patient states she has concerns regarding the results.

## 2020-03-04 NOTE — Progress Notes (Signed)
Pulmonary parenchymal pattern of fibrosis is grossly stable when compared to 07/21/10.  Please forward results to Dr. Sherene Sires.    Pulmonic truck is enlarged indicative of pulmonary arterial hypertension. Please place a referral to cardiology for further evaluation.  She has underlying rheumatoid arthritis and will need to be further evaluated for PAH.

## 2020-03-04 NOTE — Telephone Encounter (Signed)
See lab note for details.  

## 2020-03-05 ENCOUNTER — Other Ambulatory Visit: Payer: Self-pay | Admitting: Neurology

## 2020-03-12 DIAGNOSIS — Z9889 Other specified postprocedural states: Secondary | ICD-10-CM | POA: Diagnosis not present

## 2020-03-12 DIAGNOSIS — J849 Interstitial pulmonary disease, unspecified: Secondary | ICD-10-CM | POA: Diagnosis not present

## 2020-03-12 DIAGNOSIS — Z79899 Other long term (current) drug therapy: Secondary | ICD-10-CM | POA: Diagnosis not present

## 2020-03-12 DIAGNOSIS — G43909 Migraine, unspecified, not intractable, without status migrainosus: Secondary | ICD-10-CM | POA: Diagnosis not present

## 2020-03-12 DIAGNOSIS — H18609 Keratoconus, unspecified, unspecified eye: Secondary | ICD-10-CM | POA: Diagnosis not present

## 2020-03-12 DIAGNOSIS — E7800 Pure hypercholesterolemia, unspecified: Secondary | ICD-10-CM | POA: Diagnosis not present

## 2020-03-12 DIAGNOSIS — I6930 Unspecified sequelae of cerebral infarction: Secondary | ICD-10-CM | POA: Diagnosis not present

## 2020-03-12 DIAGNOSIS — I10 Essential (primary) hypertension: Secondary | ICD-10-CM | POA: Diagnosis not present

## 2020-03-14 ENCOUNTER — Ambulatory Visit: Payer: Medicare PPO | Admitting: Rheumatology

## 2020-03-20 DIAGNOSIS — H30102 Unspecified disseminated chorioretinal inflammation, left eye: Secondary | ICD-10-CM | POA: Diagnosis not present

## 2020-03-20 DIAGNOSIS — H401123 Primary open-angle glaucoma, left eye, severe stage: Secondary | ICD-10-CM | POA: Diagnosis not present

## 2020-03-20 DIAGNOSIS — H401112 Primary open-angle glaucoma, right eye, moderate stage: Secondary | ICD-10-CM | POA: Diagnosis not present

## 2020-03-20 DIAGNOSIS — Z947 Corneal transplant status: Secondary | ICD-10-CM | POA: Diagnosis not present

## 2020-03-20 DIAGNOSIS — Z79899 Other long term (current) drug therapy: Secondary | ICD-10-CM | POA: Diagnosis not present

## 2020-03-21 DIAGNOSIS — H30102 Unspecified disseminated chorioretinal inflammation, left eye: Secondary | ICD-10-CM | POA: Diagnosis not present

## 2020-03-21 DIAGNOSIS — Z961 Presence of intraocular lens: Secondary | ICD-10-CM | POA: Diagnosis not present

## 2020-03-21 DIAGNOSIS — J8410 Pulmonary fibrosis, unspecified: Secondary | ICD-10-CM | POA: Diagnosis not present

## 2020-03-21 DIAGNOSIS — H30032 Focal chorioretinal inflammation, peripheral, left eye: Secondary | ICD-10-CM | POA: Diagnosis not present

## 2020-03-21 DIAGNOSIS — H1589 Other disorders of sclera: Secondary | ICD-10-CM | POA: Diagnosis not present

## 2020-03-21 DIAGNOSIS — H4302 Vitreous prolapse, left eye: Secondary | ICD-10-CM | POA: Diagnosis not present

## 2020-03-21 DIAGNOSIS — Z9841 Cataract extraction status, right eye: Secondary | ICD-10-CM | POA: Diagnosis not present

## 2020-03-21 DIAGNOSIS — I10 Essential (primary) hypertension: Secondary | ICD-10-CM | POA: Diagnosis not present

## 2020-03-21 DIAGNOSIS — I2510 Atherosclerotic heart disease of native coronary artery without angina pectoris: Secondary | ICD-10-CM | POA: Diagnosis not present

## 2020-03-21 DIAGNOSIS — H43812 Vitreous degeneration, left eye: Secondary | ICD-10-CM | POA: Diagnosis not present

## 2020-03-21 DIAGNOSIS — H353220 Exudative age-related macular degeneration, left eye, stage unspecified: Secondary | ICD-10-CM | POA: Diagnosis not present

## 2020-03-21 DIAGNOSIS — H30033 Focal chorioretinal inflammation, peripheral, bilateral: Secondary | ICD-10-CM | POA: Diagnosis not present

## 2020-03-27 DIAGNOSIS — H401112 Primary open-angle glaucoma, right eye, moderate stage: Secondary | ICD-10-CM | POA: Diagnosis not present

## 2020-03-27 DIAGNOSIS — H3581 Retinal edema: Secondary | ICD-10-CM | POA: Diagnosis not present

## 2020-03-27 DIAGNOSIS — H401123 Primary open-angle glaucoma, left eye, severe stage: Secondary | ICD-10-CM | POA: Diagnosis not present

## 2020-03-27 DIAGNOSIS — H30033 Focal chorioretinal inflammation, peripheral, bilateral: Secondary | ICD-10-CM | POA: Diagnosis not present

## 2020-03-27 DIAGNOSIS — Z947 Corneal transplant status: Secondary | ICD-10-CM | POA: Diagnosis not present

## 2020-03-27 DIAGNOSIS — H30102 Unspecified disseminated chorioretinal inflammation, left eye: Secondary | ICD-10-CM | POA: Diagnosis not present

## 2020-03-27 DIAGNOSIS — Z48810 Encounter for surgical aftercare following surgery on the sense organs: Secondary | ICD-10-CM | POA: Diagnosis not present

## 2020-03-27 DIAGNOSIS — Z79899 Other long term (current) drug therapy: Secondary | ICD-10-CM | POA: Diagnosis not present

## 2020-03-27 DIAGNOSIS — H353221 Exudative age-related macular degeneration, left eye, with active choroidal neovascularization: Secondary | ICD-10-CM | POA: Diagnosis not present

## 2020-04-14 DIAGNOSIS — Z79899 Other long term (current) drug therapy: Secondary | ICD-10-CM | POA: Diagnosis not present

## 2020-04-14 DIAGNOSIS — H401112 Primary open-angle glaucoma, right eye, moderate stage: Secondary | ICD-10-CM | POA: Diagnosis not present

## 2020-04-14 DIAGNOSIS — H401123 Primary open-angle glaucoma, left eye, severe stage: Secondary | ICD-10-CM | POA: Diagnosis not present

## 2020-04-14 DIAGNOSIS — Z947 Corneal transplant status: Secondary | ICD-10-CM | POA: Diagnosis not present

## 2020-04-14 DIAGNOSIS — Z961 Presence of intraocular lens: Secondary | ICD-10-CM | POA: Diagnosis not present

## 2020-04-17 DIAGNOSIS — H401123 Primary open-angle glaucoma, left eye, severe stage: Secondary | ICD-10-CM | POA: Diagnosis not present

## 2020-04-17 DIAGNOSIS — H401112 Primary open-angle glaucoma, right eye, moderate stage: Secondary | ICD-10-CM | POA: Diagnosis not present

## 2020-04-17 DIAGNOSIS — H30102 Unspecified disseminated chorioretinal inflammation, left eye: Secondary | ICD-10-CM | POA: Diagnosis not present

## 2020-04-18 ENCOUNTER — Other Ambulatory Visit: Payer: Self-pay

## 2020-04-18 ENCOUNTER — Ambulatory Visit (INDEPENDENT_AMBULATORY_CARE_PROVIDER_SITE_OTHER): Payer: Medicare PPO | Admitting: Cardiology

## 2020-04-18 ENCOUNTER — Telehealth: Payer: Self-pay | Admitting: Cardiology

## 2020-04-18 ENCOUNTER — Encounter: Payer: Self-pay | Admitting: Cardiology

## 2020-04-18 VITALS — BP 140/100 | HR 74 | Ht 62.0 in | Wt 122.8 lb

## 2020-04-18 DIAGNOSIS — I2584 Coronary atherosclerosis due to calcified coronary lesion: Secondary | ICD-10-CM | POA: Diagnosis not present

## 2020-04-18 DIAGNOSIS — I10 Essential (primary) hypertension: Secondary | ICD-10-CM | POA: Diagnosis not present

## 2020-04-18 DIAGNOSIS — E785 Hyperlipidemia, unspecified: Secondary | ICD-10-CM | POA: Diagnosis not present

## 2020-04-18 DIAGNOSIS — I700 Atherosclerosis of aorta: Secondary | ICD-10-CM | POA: Diagnosis not present

## 2020-04-18 DIAGNOSIS — I2510 Atherosclerotic heart disease of native coronary artery without angina pectoris: Secondary | ICD-10-CM | POA: Diagnosis not present

## 2020-04-18 DIAGNOSIS — R0602 Shortness of breath: Secondary | ICD-10-CM | POA: Diagnosis not present

## 2020-04-18 MED ORDER — LOSARTAN POTASSIUM 50 MG PO TABS: 50.0000 mg | ORAL_TABLET | Freq: Every day | ORAL | 3 refills | Status: DC

## 2020-04-18 MED ORDER — ROSUVASTATIN CALCIUM 20 MG PO TABS: 20.0000 mg | ORAL_TABLET | Freq: Every day | ORAL | 3 refills | Status: DC

## 2020-04-18 NOTE — Progress Notes (Signed)
Cardiology Office Note:    Date:  04/18/2020   ID:  Brianna [name], DOB 1950-11-08, MRN [id number]  PCP:  Irena Reichmann, DO  [facility] Cardiologist:  Donato Schultz, MD  [facility] Electrophysiologist:  None   Referring MD: Pollyann Savoy, MD    History of Present Illness:    Brianna [name] is a 69 y.o. female here for evaluation of hypertension at the request of Dr. Corliss Zyonna Vardaman.  Difficulty control hypertension, 140/100 today in the clinic. She is currently on clonidine 0.1 twice a day and amlodipine 10 mg.  Denies any chest pain, mild shortness of breath at this point. She understands that the rheumatoid arthritis has attacked her lungs as well as her eyes. She has had a corneal transplant left eye.  Her pulmonary artery trunk was also enlarged on a CT scan, personally reviewed and measured at 31 mm. Mildly enlarged. This could be indicative of underlying pulmonary hypertension.  Her father died of myocardial infarction.  She also has coronary artery calcifications noted on CT scan.  Atorvastatin conflicted with medication for retina, leflunomide. Now on it 3 days a week .   Past Medical History:  Diagnosis Date  . Arthritis    in the neck  . Carotid artery occlusion   . Epilepsy (HCC)   . GERD (gastroesophageal reflux disease)   . Glaucoma   . Hyperlipidemia   . Hypertension   . Keratoconus of both eyes 1981  . Macular degeneration   . Migraines   . Neuromuscular disorder (HCC)    RA  . Peripheral vascular disease (HCC)    carotid occlusion surgery on left  . Raynaud's disease   . Retinal edema   . Rheumatoid arthritis(714.0)   . Stroke [facility])    two mild strokes presumed from left carotid stenosis    Past Surgical History:  Procedure Laterality Date  . CAROTID ARTERY ANGIOPLASTY  2008   Dr Madilyn Fireman  . CAROTID ENDARTERECTOMY Left 11-15-2007  . cataract extraction Right 04-2014   [facility]  . CHOLECYSTECTOMY  1995  . CORNEAL TRANSPLANT     x  5 ; steroid inj. retnal information  . 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 EYE SURGERY Right 12/2016   cornea repair   . EYE SURGERY Right 09/20/2017   cornea transplant   . GLAUCOMA SURGERY Right 2018  . TRIGGER FINGER RELEASE Right 05/16/2018   Procedure: RELEASE TRIGGER FINGER/A-1 PULLEY RIGHT THUMB;  Surgeon: Cindee Salt, MD;  Location: [facility];  Service: Orthopedics;  Laterality: Right;    Current Medications: Current Meds  Medication Sig  . amLODipine (NORVASC) 10 MG tablet   . Ascorbic Acid 500 MG CAPS Take by mouth.  Marland Kitchen aspirin 325 MG tablet Take 325 mg by mouth daily.  . botulinum toxin Type A (BOTOX) 100 units SOLR injection Inject IM into the head and neck muscles every 3 months by provider in office  . butalbital-acetaminophen-caffeine (FIORICET, ESGIC) 50-325-40 MG tablet Take 1 tablet by mouth every 6 (six) hours as needed for headache.  . Calcium Carbonate-Vit D-Min (CALCIUM 1200 PO) Take 2 capsules by mouth daily.    . Cholecalciferol (VITAMIN D PO) Take 1,000 Units by mouth daily.   . cloNIDine (CATAPRES) 0.1 MG tablet 2 (two) times daily.  . cycloSPORINE modified (NEORAL) 50 MG capsule Take by mouth daily.  . Difluprednate (DUREZOL) 0.05 % EMUL Place 1 drop into both eyes daily.  Marland Kitchen  DILANTIN 100 MG ER capsule TAKE 3 CAPSULES BY MOUTH DAILY  . ezetimibe (ZETIA) 10 MG tablet Take 10 mg by mouth daily.    . famotidine (PEPCID) 20 MG tablet Take 1 tablet (20 mg total) by mouth at bedtime.  Marland Kitchen HUMIRA PEN 40 MG/0.4ML PNKT Inject 40 mg into the skin every 14 (fourteen) days. As directed  . hypromellose (GENTEAL) 0.3 % GEL ophthalmic ointment as needed.  . leflunomide (ARAVA) 20 MG tablet Take 20 mg by mouth daily.  Marland Kitchen LORazepam (ATIVAN) 0.5 MG tablet Take 0.5 mg by mouth daily as needed for anxiety or sleep.   . pantoprazole (PROTONIX) 40 MG tablet Take 1 tablet (40 mg total) by mouth daily.  .  promethazine (PHENERGAN) 25 MG tablet Take 1 tablet (25 mg total) by mouth every 6 (six) hours as needed for nausea or vomiting.  . RESTASIS 0.05 % ophthalmic emulsion 2 (two) times daily.  . SUMAtriptan (IMITREX) 6 MG/0.5ML SOLN injection Inject 0.5 mLs (6 mg total) into the skin as needed for migraine.   Current Facility-Administered Medications for the 04/18/20 encounter (Office Visit) with Jake Bathe, MD  Medication  . pneumococcal 23 valent vaccine (PNU-IMMUNE) injection 0.5 mL     Allergies:   Morphine, Morphine and related, Alphagan [brimonidine], Codeine, Dorzolamide hcl-timolol mal, Hydrocodone-acetaminophen, Sulfa antibiotics, and Sulfamethoxazole   Social History   Socioeconomic History  . Marital status: Married    Spouse name: [name]  . Number of children: 2  . Years of education: Bachelor  . Highest education level: Not on file  Occupational History  . Occupation: Runner, broadcasting/film/video  . Occupation: [employer] TEACHER    Employer: [employer]  Tobacco Use  . Smoking status: Former Smoker    Packs/day: 0.50    Years: 14.00    Pack years: 7.00    Types: Cigarettes    Quit date: 07/19/1988    Years since quitting: 31.7  . Smokeless tobacco: Never Used  Vaping Use  . Vaping Use: Never used  Substance and Sexual Activity  . Alcohol use: No  . Drug use: No  . Sexual activity: Not on file  Other Topics Concern  . Not on file  Social History Narrative   Patient is married to [name]), has 2 children   Patient is right handed   Education level is Bachelor's degree   Caffeine consumption is 2 cups daily   Lives at home with husband    Social Determinants of Health   Financial Resource Strain:   . Difficulty of Paying Living Expenses: Not on file  Food Insecurity:   . Worried About Programme researcher, broadcasting/film/video in the Last Year: Not on file  . Ran Out of Food in the Last Year: Not on file  Transportation Needs:   . Lack of Transportation (Medical): Not on file  . Lack of  Transportation (Non-Medical): Not on file  Physical Activity:   . Days of Exercise per Week: Not on file  . Minutes of Exercise per Session: Not on file  Stress:   . Feeling of Stress : Not on file  Social Connections:   . Frequency of Communication with Friends and Family: Not on file  . Frequency of Social Gatherings with Friends and Family: Not on file  . Attends Religious Services: Not on file  . Active Member of Clubs or Organizations: Not on file  . Attends Banker Meetings: Not on file  . Marital Status: Not on file  Family History: [name] family history includes Cervical cancer in her maternal aunt; Clotting disorder in her father; Collagen disease in her father; Epilepsy in her sister; Heart disease in her father and mother; High Cholesterol in her brother, brother, brother, and sister; High blood pressure in her brother, brother, and brother; Lung disease in her mother; Prostate cancer in her maternal grandfather; Rheum arthritis in her maternal grandmother and sister; Uterine cancer in her mother.  ROS:   Please see the history of present illness.     All other systems reviewed and are negative.  EKGs/Labs/Other Studies Reviewed:    The following studies were reviewed today: CT scan personally reviewed as above  EKG:  EKG is  ordered today.  The ekg ordered today demonstrates sinus rhythm 74 left axis deviation  Recent Labs: No results found for requested labs within last 8760 hours.  Recent Lipid Panel No results found for: CHOL, TRIG, HDL, CHOLHDL, VLDL, LDLCALC, LDLDIRECT  Physical Exam:    VS:  BP (!) 140/100   Pulse 74   Ht 5\' 2"  (1.575 m)   Wt 122 lb 12.8 oz (55.7 kg)   BMI 22.46 kg/m     Wt Readings from Last 3 Encounters:  04/18/20 122 lb 12.8 oz (55.7 kg)  02/19/20 124 lb 9.6 oz (56.5 kg)  10/12/19 128 lb 9.6 oz (58.3 kg)     GEN:  Well nourished, well developed in no acute distress HEENT: Normal NECK: No JVD; No carotid  bruits LYMPHATICS: No lymphadenopathy CARDIAC: RRR, no murmurs, rubs, gallops RESPIRATORY:  Clear to auscultation without rales, wheezing or rhonchi  ABDOMEN: Soft, non-tender, non-distended MUSCULOSKELETAL:  No edema; No deformity  SKIN: Warm and dry NEUROLOGIC:  Alert and oriented x 3 PSYCHIATRIC:  Normal affect   ASSESSMENT:    1. Shortness of breath   2. Primary hypertension   3. Hyperlipidemia, unspecified hyperlipidemia type   4. Coronary artery calcification   5. Aortic atherosclerosis (HCC)    PLAN:    In order of problems listed above:  Difficult to control hypertension -I will go ahead and start her on losartan 50 mg once a day, I will also have her see the hypertension clinic in the next 3 to 4 weeks.  Continue with amlodipine and low-dose clonidine.  Clonidine as side effect can cause tiredness although she was tired prior to utilization of this medication. -Next step may be to add chlorthalidone or HCTZ. -Creatinine 0.51, hemoglobin A1c 5.4, hemoglobin 14.1  Hyperlipidemia -Last LDL 98 on atorvastatin as well as Zetia.  I would like to go ahead and switch her over to Crestor 20 mg once a day, high intensity statin given her coronary artery calcification seen on CT scan. -I reviewed with her pharmacy team here, statins can interact with leflunomide, causing an increased concentration of the statin.  Clinically however we are using 20 mg not 40 mg and her LDL still not at goal at this point.  I think it is reasonable clinically to continue with this prescription.  Coronary artery calcification -Calcified plaque, coronary atherosclerosis seen on CT scan.  Aggressive risk factor modification.  She is on aspirin 325 as well for her RA.  No chest pain.  Continue.  Rheumatoid arthritis -Reviewed medications.  Affecting her lungs, eyes.  Could be affecting pulmonary artery pressures as well.  I am going to check a echocardiogram to ensure proper structure and function of her  heart.  Her pulmonary trunk was measured as 31 mm  which is mildly dilated.  With her underlying pulmonary disease, it would not be unexpected to see elevated pulmonary pressures.  Also with highly elevated blood pressure, elevated pulmonary pressures can be existent as well.  Working on the blood pressure should help with this as well.   Medication Adjustments/Labs and Tests Ordered: Current medicines are reviewed at length with the patient today.  Concerns regarding medicines are outlined above.  Orders Placed This Encounter  Procedures  . Lipid panel  . EKG 12-Lead  . ECHOCARDIOGRAM COMPLETE   Meds ordered this encounter  Medications  . rosuvastatin (CRESTOR) 20 MG tablet    Sig: Take 1 tablet (20 mg total) by mouth daily.    Dispense:  90 tablet    Refill:  3  . losartan (COZAAR) 50 MG tablet    Sig: Take 1 tablet (50 mg total) by mouth daily.    Dispense:  90 tablet    Refill:  3    Patient Instructions  Medication Instructions:  Please start Crestor 20 mg a day. Start Losartan 50 mg a day. Continue all other medications as listed.  *If you need a refill on your cardiac medications before your next appointment, please call your pharmacy*  Lab Work: Please have blood work in 3 months (Lipid) If you have labs (blood work) drawn today and your tests are completely normal, you will receive your results only by: Marland Kitchen MyChart Message (if you have MyChart) OR . A paper copy in the mail If you have any lab test that is abnormal or we need to change your treatment, we will call you to review the results.  Testing/Procedures: Your physician has requested that you have an echocardiogram. Echocardiography is a painless test that uses sound waves to create images of your heart. It provides your doctor with information about the size and shape of your heart and how well your heart's chambers and valves are working. This procedure takes approximately one hour. There are no restrictions for  this procedure.  You have been referred to the Hypertension Clinic and need to be seen in 3 to 4 weeks.  Follow-Up: At [facility], you and your health needs are our priority.  As part of our continuing mission to provide you with exceptional heart care, we have created designated Provider Care Teams.  These Care Teams include your primary Cardiologist (physician) and Advanced Practice Providers (APPs -  Physician Assistants and Nurse Practitioners) who all work together to provide you with the care you need, when you need it.  We recommend signing up for the patient portal called "MyChart".  Sign up information is provided on this After Visit Summary.  MyChart is used to connect with patients for Virtual Visits (Telemedicine).  Patients are able to view lab/test results, encounter notes, upcoming appointments, etc.  Non-urgent messages can be sent to your provider as well.   To learn more about what you can do with MyChart, go to [email or web address].    Your next appointment:   3 month(s)  The format for your next appointment:   In Person  Provider:   Donato Schultz, MD  Thank you for choosing [facility]!!        Signed, Donato Schultz, MD  04/18/2020 3:17 PM    [facility]

## 2020-04-18 NOTE — Telephone Encounter (Signed)
*  STAT* If patient is at the pharmacy, call can be transferred to refill team.   1. Which medications need to be refilled? (please list name of each medication and dose if known)  losartan (COZAAR) 50 MG tablet rosuvastatin (CRESTOR) 20 MG tablet  2. Which pharmacy/location (including street and city if local pharmacy) is medication to be sent to? [facility] - [place], [place] - [address]  3. Do they need a 30 day or 90 day supply? 90 with refills  Pt was in to see Dr. Anne Fu today and needs her medications to go to this [facility] not the [facility] that was in her chart. Please send new RX to [facility]

## 2020-04-18 NOTE — Patient Instructions (Addendum)
Medication Instructions:  Please start Crestor 20 mg a day. Start Losartan 50 mg a day. Continue all other medications as listed.  *If you need a refill on your cardiac medications before your next appointment, please call your pharmacy*  Lab Work: Please have blood work in 3 months (Lipid) If you have labs (blood work) drawn today and your tests are completely normal, you will receive your results only by: Marland Kitchen MyChart Message (if you have MyChart) OR . A paper copy in the mail If you have any lab test that is abnormal or we need to change your treatment, we will call you to review the results.  Testing/Procedures: Your physician has requested that you have an echocardiogram. Echocardiography is a painless test that uses sound waves to create images of your heart. It provides your doctor with information about the size and shape of your heart and how well your heart's chambers and valves are working. This procedure takes approximately one hour. There are no restrictions for this procedure.  You have been referred to the Hypertension Clinic and need to be seen in 3 to 4 weeks.  Follow-Up: At [facility], you and your health needs are our priority.  As part of our continuing mission to provide you with exceptional heart care, we have created designated Provider Care Teams.  These Care Teams include your primary Cardiologist (physician) and Advanced Practice Providers (APPs -  Physician Assistants and Nurse Practitioners) who all work together to provide you with the care you need, when you need it.  We recommend signing up for the patient portal called "MyChart".  Sign up information is provided on this After Visit Summary.  MyChart is used to connect with patients for Virtual Visits (Telemedicine).  Patients are able to view lab/test results, encounter notes, upcoming appointments, etc.  Non-urgent messages can be sent to your provider as well.   To learn more about what you can do with  MyChart, go to [email or web address].    Your next appointment:   3 month(s)  The format for your next appointment:   In Person  Provider:   Donato Schultz, MD  Thank you for choosing [facility]!!

## 2020-04-21 ENCOUNTER — Other Ambulatory Visit: Payer: Self-pay

## 2020-04-21 MED ORDER — ROSUVASTATIN CALCIUM 20 MG PO TABS: 20.0000 mg | ORAL_TABLET | Freq: Every day | ORAL | 3 refills | Status: DC

## 2020-04-21 MED ORDER — LOSARTAN POTASSIUM 50 MG PO TABS: 50.0000 mg | ORAL_TABLET | Freq: Every day | ORAL | 3 refills | Status: DC

## 2020-04-21 NOTE — Telephone Encounter (Signed)
Pt's medication was resent to pt's pharmacy as requested. Confirmation received.  °

## 2020-04-22 ENCOUNTER — Telehealth: Payer: Self-pay

## 2020-04-22 DIAGNOSIS — H401123 Primary open-angle glaucoma, left eye, severe stage: Secondary | ICD-10-CM | POA: Diagnosis not present

## 2020-04-22 DIAGNOSIS — Z48810 Encounter for surgical aftercare following surgery on the sense organs: Secondary | ICD-10-CM | POA: Diagnosis not present

## 2020-04-22 DIAGNOSIS — Z9889 Other specified postprocedural states: Secondary | ICD-10-CM | POA: Diagnosis not present

## 2020-04-22 DIAGNOSIS — H353221 Exudative age-related macular degeneration, left eye, with active choroidal neovascularization: Secondary | ICD-10-CM | POA: Diagnosis not present

## 2020-04-22 DIAGNOSIS — Z947 Corneal transplant status: Secondary | ICD-10-CM | POA: Diagnosis not present

## 2020-04-22 DIAGNOSIS — H401112 Primary open-angle glaucoma, right eye, moderate stage: Secondary | ICD-10-CM | POA: Diagnosis not present

## 2020-04-22 DIAGNOSIS — H3581 Retinal edema: Secondary | ICD-10-CM | POA: Diagnosis not present

## 2020-04-22 DIAGNOSIS — Z79899 Other long term (current) drug therapy: Secondary | ICD-10-CM | POA: Diagnosis not present

## 2020-04-22 DIAGNOSIS — H30033 Focal chorioretinal inflammation, peripheral, bilateral: Secondary | ICD-10-CM | POA: Diagnosis not present

## 2020-04-22 NOTE — Telephone Encounter (Signed)
Pt called in wanted to know if she was suppose to keep taking the amLODipine (NORVASC) 10 MG tablet [[id number]] With both of the below meds that was added ?

## 2020-04-22 NOTE — Telephone Encounter (Signed)
**Note De-Identified Romyn Boswell Obfuscation** I started a Rosuvastatin PA through Covermymeds: Key: [id number]

## 2020-04-22 NOTE — Telephone Encounter (Signed)
Spoke with pt and made her aware that she is to continue the Amlodipine.  Pt verbalized understanding.

## 2020-04-23 NOTE — Telephone Encounter (Signed)
**Note De-Identified Zamiya Dillard Obfuscation** Letter received from [facility] stating that they have approved the pts Rosuvastatin PA. Approval is valid until 07/18/21.  I have notified [facility] of this approval.

## 2020-04-25 DIAGNOSIS — H30033 Focal chorioretinal inflammation, peripheral, bilateral: Secondary | ICD-10-CM | POA: Diagnosis not present

## 2020-04-25 DIAGNOSIS — H30102 Unspecified disseminated chorioretinal inflammation, left eye: Secondary | ICD-10-CM | POA: Diagnosis not present

## 2020-04-25 DIAGNOSIS — Z79899 Other long term (current) drug therapy: Secondary | ICD-10-CM | POA: Diagnosis not present

## 2020-04-26 ENCOUNTER — Other Ambulatory Visit: Payer: Self-pay | Admitting: Internal Medicine

## 2020-04-26 DIAGNOSIS — R059 Cough, unspecified: Secondary | ICD-10-CM

## 2020-05-04 ENCOUNTER — Other Ambulatory Visit: Payer: Self-pay | Admitting: Internal Medicine

## 2020-05-06 ENCOUNTER — Telehealth: Payer: Self-pay | Admitting: Cardiology

## 2020-05-06 MED ORDER — CLONIDINE HCL 0.1 MG PO TABS
0.1000 mg | ORAL_TABLET | Freq: Two times a day (BID) | ORAL | 11 refills | Status: DC
Start: 2020-05-06 — End: 2020-05-12

## 2020-05-06 NOTE — Telephone Encounter (Signed)
    Pt c/o BP issue: STAT if pt c/o blurred vision, one-sided weakness or slurred speech  1. What are your last 5 BP readings?  177/98 180/106 164/107 160/114  2. Are you having any other symptoms (ex. Dizziness, headache, blurred vision, passed out)? Bad Headache  3. What is your BP issue? Pt said her BP is elevated, she said she also experience really bad headache that's radiate to her right shoulder.

## 2020-05-06 NOTE — Telephone Encounter (Signed)
Spoke with pt who states she is only calling because the nurse at [facility] suggested her BP is too high.  Pt reports having h/a today but that it is not as bad as the one she had on Sunday.  It feels like tension on the right side of her head/neck.  In review of her medications she does report taking Amlodipine 10 mg daily and Losartan 50 mg as rxed.  She has not been taking her Clonidine 0.1 mg BID as instructed.  She reports she thought she was supposed to stop it.  Advised according to her instructions she is supposed to be taking it.  She will restart as rxed.  Refill sent into pharmacy of choice.  She will f/u as previously scheduled.

## 2020-05-11 NOTE — Patient Instructions (Addendum)
It was nice to meet you today   Your blood pressure goal is less than 130/64mmHg  START taking chlorthalidone 12.5 mg (HALF-tablet) once daily in the morning  DECREASE clonidine to 0.1 mg ONCE DAILY for one week. I will call you next Monday to monitor your blood pressure and see if we can stop clonidine.  Continue taking your amlodipine 10 mg daily and losartan 50 mg daily   Please continue to monitor your blood pressure at home. Bring your readings and your home cuff to your next appointment.  Please call us at [phone] if you have any questions

## 2020-05-11 NOTE — Progress Notes (Signed)
Patient ID: Brianna [name]                 DOB: 10-Mar-1951                      MRN: [id number]     HPI: Brianna [name] is a 69 y.o. female referred by Dr. Anne Fu to HTN clinic. PMH is significant for HTN, HLD, PVD, RA, migraines, arthritis, and hx stroke. Pulmonary artery trunk noted to be enlarged on CT scan, indicative of possible underlying PH. Coronary artery calcifications also noted on CT scan.  On 04/18/20, she was seen by Dr. Anne Fu and BP was 140/100, HR 74. She was started on losartan 50 mg daily. Her LDL was also elevated on atorvastatin 20 mg TIW and ezetimibe 10 mg daily, so atorvastatin was changed to rosuvastatin 20 mg daily for better LDL control. She was on a lower dose of atorvastatin due to contraindication with cyclosporine (increases statin concentration). Rosuvastatin is not contraindicated with cyclosporine but requires closer monitoring of LFTs.  On 05/06/20, she reported elevated home BP readings (177/98, 180/106, 164/107, 160/114). She was not taking her clonidine because she thought she was supposed to stop taking it, so she was instructed to restart.  Patient arrives today for initial visit. She denies any adverse effects since starting losartan. She did restart her clonidine last week but has not been able to measure her BP since then. She states that she was on amlodipine 5 mg daily for years prior to increasing it to 10 mg and started clonidine in April/May of this year. Her PCP made these medication changes and she is not sure why clonidine was the agent of choice. She complains of severe dry mouth and extreme fatigue since starting clonidine, and her fatigue requires her to drink an energy drink like Red Bull in addition to her 3-4 8-oz cups of coffee each day. She also noticed a significant loss of appetite since starting clonidine and only eats dinner.  She enjoys salty foods and adds salt to her food, but she is willing to try using Mrs. DASH to decrease salt intake.  She will also consider decreasing her energy drink intake if her fatigue improves. Her physical activity is limited given her vision problems, but she would like to start going to the gym and go on walks more frequently with her husband.  Patient states that she took rosuvastatin many years ago but her doctor at the time stopped it due to thigh myalgias. She reports that she is tolerating rosuvastatin and ezetimibe well but would like closer monitoring of lipids/LFTs given possible risk with cyclosporine.  Current HTN meds: amlodipine 10 mg daily, losartan 50 mg daily, clonidine 0.1 mg BID Previously tried: N/A BP goal: <130/80 mmHg  Current lipid-lowering meds: rosuvastatin 20 mg daily, ezetimibe 10 mg daily Previously tried: atorvastatin 20 mg TIW (ineffective) LDL goal: <70 mg/dL  Family History: father died of MI, HTN in multiple family members  Diet: Enjoys salty foods and adds salt to food. Only eats dinner - at a BLT last night; enjoys chicken and has steak Q2 weeks. Drinks 3-4 8 oz cups of coffee and 1 energy drink (I.e. red bull) daily.  Exercise: limited due to vision; house/yard work, walks 0.5-1 mile around neighborhood  Home BP readings: 185/102 when stopped taking clonidine, has not measured BP since restarting clonidine  Labs: 04/25/20: Scr 0.6, K 4.8, Na 139 (1 week after starting losartan)   Wt  Readings from Last 3 Encounters:  04/18/20 122 lb 12.8 oz (55.7 kg)  02/19/20 124 lb 9.6 oz (56.5 kg)  10/12/19 128 lb 9.6 oz (58.3 kg)   BP Readings from Last 3 Encounters:  04/18/20 (!) 140/100  02/19/20 (!) 187/101  11/08/19 (!) 200/99   Pulse Readings from Last 3 Encounters:  04/18/20 74  02/19/20 88  11/08/19 80    Renal function: CrCl cannot be calculated (Patient's most recent lab result is older than the maximum 21 days allowed.).  Past Medical History:  Diagnosis Date  . Arthritis    in the neck  . Carotid artery occlusion   . Epilepsy (HCC)   . GERD  (gastroesophageal reflux disease)   . Glaucoma   . Hyperlipidemia   . Hypertension   . Keratoconus of both eyes 1981  . Macular degeneration   . Migraines   . Neuromuscular disorder (HCC)    RA  . Peripheral vascular disease (HCC)    carotid occlusion surgery on left  . Raynaud's disease   . Retinal edema   . Rheumatoid arthritis(714.0)   . Stroke [facility]    two mild strokes presumed from left carotid stenosis    Current Outpatient Medications on File Prior to Visit  Medication Sig Dispense Refill  . amLODipine (NORVASC) 10 MG tablet     . Ascorbic Acid 500 MG CAPS Take by mouth.    Marland Kitchen aspirin 325 MG tablet Take 325 mg by mouth daily.    . botulinum toxin Type A (BOTOX) 100 units SOLR injection Inject IM into the head and neck muscles every 3 months by provider in office 2 vial 3  . butalbital-acetaminophen-caffeine (FIORICET, ESGIC) 50-325-40 MG tablet Take 1 tablet by mouth every 6 (six) hours as needed for headache. 10 tablet 4  . Calcium Carbonate-Vit D-Min (CALCIUM 1200 PO) Take 2 capsules by mouth daily.      . Cholecalciferol (VITAMIN D PO) Take 1,000 Units by mouth daily.     . cloNIDine (CATAPRES) 0.1 MG tablet Take 1 tablet (0.1 mg total) by mouth 2 (two) times daily. 60 tablet 11  . cycloSPORINE modified (NEORAL) 50 MG capsule Take by mouth daily.    . Difluprednate (DUREZOL) 0.05 % EMUL Place 1 drop into both eyes daily.    Marland Kitchen DILANTIN 100 MG ER capsule TAKE 3 CAPSULES BY MOUTH DAILY 270 capsule 3  . ezetimibe (ZETIA) 10 MG tablet Take 10 mg by mouth daily.      . famotidine (PEPCID) 20 MG tablet TAKE 1 TABLET(20 MG) BY MOUTH AT BEDTIME 90 tablet 1  . HUMIRA PEN 40 MG/0.4ML PNKT Inject 40 mg into the skin every 14 (fourteen) days. As directed    . hypromellose (GENTEAL) 0.3 % GEL ophthalmic ointment as needed.    . leflunomide (ARAVA) 20 MG tablet Take 20 mg by mouth daily.    Marland Kitchen LORazepam (ATIVAN) 0.5 MG tablet Take 0.5 mg by mouth daily as needed for anxiety or sleep.      Marland Kitchen losartan (COZAAR) 50 MG tablet Take 1 tablet (50 mg total) by mouth daily. 90 tablet 3  . pantoprazole (PROTONIX) 40 MG tablet TAKE 1 TABLET(40 MG) BY MOUTH DAILY 90 tablet 0  . promethazine (PHENERGAN) 25 MG tablet Take 1 tablet (25 mg total) by mouth every 6 (six) hours as needed for nausea or vomiting. 30 tablet 11  . RESTASIS 0.05 % ophthalmic emulsion 2 (two) times daily.    . rosuvastatin (CRESTOR) 20 MG  tablet Take 1 tablet (20 mg total) by mouth daily. 90 tablet 3  . SUMAtriptan (IMITREX) 6 MG/0.5ML SOLN injection Inject 0.5 mLs (6 mg total) into the skin as needed for migraine. 4 mL 11   Current Facility-Administered Medications on File Prior to Visit  Medication Dose Route Frequency Provider Last Rate Last Admin  . pneumococcal 23 valent vaccine (PNU-IMMUNE) injection 0.5 mL  0.5 mL Intramuscular Once Nyoka Cowden, MD        Allergies  Allergen Reactions  . Morphine Other (See Comments)    Difficulty breathing  . Morphine And Related Shortness Of Breath  . Alphagan [Brimonidine]     Eyelid swelling, scratching Allergic to preservative contained in this med  . Codeine Nausea And Vomiting  . Dorzolamide Hcl-Timolol Mal     Inflammation of the eyelid, scratching Allergic to preservative contained in this med  . Hydrocodone-Acetaminophen Nausea And Vomiting    But tolerates tylenol  . Sulfa Antibiotics Rash    "Large bumps"  . Sulfamethoxazole Rash     Assessment/Plan:  1. Hypertension - BP is elevated and above goal of <130/80 mmHg. Start chlorthalidone 12.5 mg daily. Since clonidine is not first-line therapy for HTN and given patient's extreme fatigue/dry mouth on clonidine (common side effects of clonidine), will taper off clonidine by decreasing to 0.1 mg once daily with plans to stop after 1 week on decreased dose. Continue amlodipine 10 mg daily and losartan 50 mg daily. Reviewed proper BP measuring technique - patient will start measuring BP daily ~2 hours after  morning medications and bring records to next visit. Educated patient on the importance of maintaining cardiac healthy diet low in sodium and regular physical activity. Patient will start using Mrs Sharilyn Sites instead of salt and will consider decreasing energy drink intake if her fatigue improves. She will also increase her physical activity as able. Per patient preference, will call patient in 1 week to review BP and clonidine taper. Will schedule follow-up appointment at that time.  Tama Headings, PharmD PGY2 Cardiology Pharmacy Resident

## 2020-05-12 ENCOUNTER — Ambulatory Visit (INDEPENDENT_AMBULATORY_CARE_PROVIDER_SITE_OTHER): Payer: Medicare PPO | Admitting: Pharmacist

## 2020-05-12 ENCOUNTER — Ambulatory Visit: Payer: Medicare PPO | Attending: Internal Medicine

## 2020-05-12 ENCOUNTER — Other Ambulatory Visit: Payer: Self-pay

## 2020-05-12 ENCOUNTER — Other Ambulatory Visit (HOSPITAL_BASED_OUTPATIENT_CLINIC_OR_DEPARTMENT_OTHER): Payer: Self-pay | Admitting: Internal Medicine

## 2020-05-12 ENCOUNTER — Ambulatory Visit (HOSPITAL_COMMUNITY): Payer: Medicare PPO

## 2020-05-12 ENCOUNTER — Ambulatory Visit (HOSPITAL_BASED_OUTPATIENT_CLINIC_OR_DEPARTMENT_OTHER): Payer: Medicare PPO

## 2020-05-12 DIAGNOSIS — R0602 Shortness of breath: Secondary | ICD-10-CM | POA: Insufficient documentation

## 2020-05-12 DIAGNOSIS — Z23 Encounter for immunization: Secondary | ICD-10-CM

## 2020-05-12 DIAGNOSIS — I10 Essential (primary) hypertension: Secondary | ICD-10-CM | POA: Insufficient documentation

## 2020-05-12 LAB — ECHOCARDIOGRAM COMPLETE: S' Lateral: 2.11 cm

## 2020-05-12 MED ORDER — CLONIDINE HCL 0.1 MG PO TABS: 0.1000 mg | ORAL_TABLET | Freq: Every day | ORAL | 0 refills | Status: DC

## 2020-05-12 MED ORDER — CHLORTHALIDONE 25 MG PO TABS: 12.5000 mg | ORAL_TABLET | Freq: Every day | ORAL | 11 refills | Status: DC

## 2020-05-12 NOTE — Progress Notes (Signed)
   Covid-19 Vaccination Clinic  Name:  Brianna [name]    MRN: [id number] DOB: [birth year]  05/12/2020  [name] was observed post Covid-19 immunization for 15 minutes without incident. She was provided with Vaccine Information Sheet and instruction to access the V-Safe system. Vaccinated by Dionisio David.  [name] was instructed to call 911 with any severe reactions post vaccine: Marland Kitchen Difficulty breathing  . Swelling of face and throat  . A fast heartbeat  . A bad rash all over body  . Dizziness and weakness

## 2020-05-15 ENCOUNTER — Other Ambulatory Visit (HOSPITAL_COMMUNITY): Payer: Medicare PPO

## 2020-05-19 ENCOUNTER — Telehealth: Payer: Self-pay | Admitting: Pharmacist

## 2020-05-19 ENCOUNTER — Ambulatory Visit: Payer: Medicare PPO | Admitting: Family Medicine

## 2020-05-19 NOTE — Telephone Encounter (Addendum)
Called patient to follow-up from 05/12/20 office visit. She states that she is tolerating chlorthalidone and decreased dose of clonidine but has had two short episodes of nausea/cramping since last visit. Both episodes were at different times of the day and lasted ~5 minutes each. Recent home BP readings remain above goal of <130/80 mmHg at 152/89, 161/95, 170/97, 158/94. Instructed patient to continue measuring daily BP and to record BP if she has further episodes of nausea/cramping. Instructed her to stop clonidine and to continue chlorthalidone 12.5 mg daily, amlodipine 10 mg daily and losartan 50 mg daily. Patient will likely need higher dose based on home BP readings, however will hold on making further changes until follow-up labs drawn at next visit. Scheduled follow-up office visit for 05/26/20. Will recheck labs at this visit.

## 2020-05-19 NOTE — Addendum Note (Signed)
Addended byTama Headings on: 05/19/2020 03:49 PM   Modules accepted: Orders

## 2020-05-26 ENCOUNTER — Other Ambulatory Visit: Payer: Self-pay

## 2020-05-26 ENCOUNTER — Ambulatory Visit (INDEPENDENT_AMBULATORY_CARE_PROVIDER_SITE_OTHER): Payer: Medicare PPO | Admitting: Pharmacist

## 2020-05-26 VITALS — BP 148/86 | HR 85

## 2020-05-26 DIAGNOSIS — I10 Essential (primary) hypertension: Secondary | ICD-10-CM

## 2020-05-26 DIAGNOSIS — E785 Hyperlipidemia, unspecified: Secondary | ICD-10-CM | POA: Diagnosis not present

## 2020-05-26 NOTE — Progress Notes (Signed)
Patient ID: Brianna [name]                 DOB: [birth year]                      MRN: [id number]     HPI: Brianna [name] is a 69 y.o. female referred by Dr. Anne Fu to HTN clinic. PMH is significant for HTN, HLD, PVD, RA, migraines, arthritis, and hx stroke. Pulmonary artery trunk noted to be enlarged on CT scan, indicative of possible underlying PH. Coronary artery calcifications also noted on CT scan.  On 04/18/20, she was seen by Dr. Anne Fu and BP was 140/100, HR 74. She was started on losartan 50 mg daily. Her LDL was also elevated on atorvastatin 20 mg TIW and ezetimibe 10 mg daily, so atorvastatin was changed to rosuvastatin 20 mg daily for better LDL control. She was on a lower dose of atorvastatin due to contraindication with cyclosporine (increases statin concentration). Rosuvastatin is not contraindicated with cyclosporine but requires closer monitoring of LFTs.  On 05/06/20, she reported elevated home BP readings (177/98, 180/106, 164/107, 160/114). She was not taking her clonidine because she thought she was supposed to stop taking it, so she was instructed to restart.  On 05/12/20, she presented for initial HTN clinic visit and BP was 142/96, HR 89. She denied any adverse effects since starting losartan and reported restarting clonidine. She had been taking amlodipine 5 mg daily for years prior to increasing to 10 mg and started clonidine in April/May 2021. Her PCP made these medication changes and she is not sure why clonidine was the agent of choice. She complained of severe dry mouth and extreme fatigue with clonidine, and her fatigue required her to drink a Red Bull in addition to her 3-4 8-oz cups of coffee each day. She also noticed a significant loss of appetite since starting clonidine and only eats dinner. She was started on chlorthalidone 12.5 mg daily. Since clonidine is not first-line therapy for HTN and given patient's extreme fatigue/dry mouth on clonidine (common side effects of  clonidine), clonidine was decreased to 0.1 mg once daily with plans to stop after 1 week.  On 05/19/20, she reported tolerating chlorthalidone and decreased dose of clonidine. Her home BP readings were still above goal of <130/80 mmHg at 152/89, 161/95, 170/97, 158/94. Given her side effects on clonidine, instructed her to stop clonidine and further medication changes were held until follow-up labs drawn at next visit.  She reports today for follow-up. Her home BP readings are still above goal but improved within the last week. She denies any adverse effects since stopping clonidine last Monday. She reports her fatigue has improved significantly and she "doesn't feel like an 69 year-old" anymore. Her energy drink consumption has decreased. She still has some dry mouth but this has improved. No longer having stomach cramps or nausea since stopping clonidine. She no longer adds salt to her food and uses a lot of pepper. Continues to walk 0.5-1 mile with puppy and is willing to increase her physical activity.  Current HTN meds: amlodipine 10 mg daily, losartan 50 mg daily, chlorthalidone 12.5 mg daily  Previously tried: clonidine 0.1 mg QD-BID (dry mouth, extreme fatigue, loss appetite) BP goal: <130/80 mmHg  Current lipid-lowering meds: rosuvastatin 20 mg daily, ezetimibe 10 mg daily Previously tried: atorvastatin 20 mg TIW (ineffective) LDL goal: <70 mg/dL  Family History: father died of MI, HTN in multiple family members  Diet: Enjoys  salty foods and adds salt to food. Only eats dinner - ate a BLT last night; enjoys chicken and has steak Q2 weeks. Drinks 3-4 8 oz cups of coffee and 1 energy drink (I.e. red bull) daily.  Exercise: limited due to vision; house/yard work, walks 0.5-1 mile around neighborhood  Home BP readings: 144/99, 146/104, 149/95, 157/97, 152/101  Labs: 04/25/20: Scr 0.6, K 4.8, Na 139 (1 week after starting losartan)   Wt Readings from Last 3 Encounters:  04/18/20 122 lb  12.8 oz (55.7 kg)  02/19/20 124 lb 9.6 oz (56.5 kg)  10/12/19 128 lb 9.6 oz (58.3 kg)   BP Readings from Last 3 Encounters:  05/12/20 (!) 142/96  04/18/20 (!) 140/100  02/19/20 (!) 187/101   Pulse Readings from Last 3 Encounters:  05/12/20 89  04/18/20 74  02/19/20 88    Renal function: CrCl cannot be calculated (Patient's most recent lab result is older than the maximum 21 days allowed.).  Past Medical History:  Diagnosis Date  . Arthritis    in the neck  . Carotid artery occlusion   . Epilepsy (HCC)   . GERD (gastroesophageal reflux disease)   . Glaucoma   . Hyperlipidemia   . Hypertension   . Keratoconus of both eyes 1981  . Macular degeneration   . Migraines   . Neuromuscular disorder (HCC)    RA  . Peripheral vascular disease (HCC)    carotid occlusion surgery on left  . Raynaud's disease   . Retinal edema   . Rheumatoid arthritis(714.0)   . Stroke [facility]    two mild strokes presumed from left carotid stenosis    Current Outpatient Medications on File Prior to Visit  Medication Sig Dispense Refill  . amLODipine (NORVASC) 10 MG tablet     . Ascorbic Acid 500 MG CAPS Take by mouth.    Marland Kitchen aspirin 325 MG tablet Take 325 mg by mouth daily.    . botulinum toxin Type A (BOTOX) 100 units SOLR injection Inject IM into the head and neck muscles every 3 months by provider in office 2 vial 3  . butalbital-acetaminophen-caffeine (FIORICET, ESGIC) 50-325-40 MG tablet Take 1 tablet by mouth every 6 (six) hours as needed for headache. 10 tablet 4  . Calcium Carbonate-Vit D-Min (CALCIUM 1200 PO) Take 2 capsules by mouth daily.      . chlorthalidone (HYGROTON) 25 MG tablet Take 0.5 tablets (12.5 mg total) by mouth daily. 15 tablet 11  . Cholecalciferol (VITAMIN D PO) Take 1,000 Units by mouth daily.     . cycloSPORINE modified (NEORAL) 50 MG capsule Take by mouth daily.    . Difluprednate (DUREZOL) 0.05 % EMUL Place 1 drop into both eyes daily.    Marland Kitchen DILANTIN 100 MG ER capsule  TAKE 3 CAPSULES BY MOUTH DAILY 270 capsule 3  . ezetimibe (ZETIA) 10 MG tablet Take 10 mg by mouth daily.      . famotidine (PEPCID) 20 MG tablet TAKE 1 TABLET(20 MG) BY MOUTH AT BEDTIME 90 tablet 1  . HUMIRA PEN 40 MG/0.4ML PNKT Inject 40 mg into the skin every 14 (fourteen) days. As directed    . hypromellose (GENTEAL) 0.3 % GEL ophthalmic ointment as needed.    . leflunomide (ARAVA) 20 MG tablet Take 20 mg by mouth daily.    Marland Kitchen LORazepam (ATIVAN) 0.5 MG tablet Take 0.5 mg by mouth daily as needed for anxiety or sleep.     Marland Kitchen losartan (COZAAR) 50 MG tablet Take 1  tablet (50 mg total) by mouth daily. 90 tablet 3  . pantoprazole (PROTONIX) 40 MG tablet TAKE 1 TABLET(40 MG) BY MOUTH DAILY 90 tablet 0  . promethazine (PHENERGAN) 25 MG tablet Take 1 tablet (25 mg total) by mouth every 6 (six) hours as needed for nausea or vomiting. 30 tablet 11  . RESTASIS 0.05 % ophthalmic emulsion 2 (two) times daily.    . rosuvastatin (CRESTOR) 20 MG tablet Take 1 tablet (20 mg total) by mouth daily. 90 tablet 3  . SUMAtriptan (IMITREX) 6 MG/0.5ML SOLN injection Inject 0.5 mLs (6 mg total) into the skin as needed for migraine. 4 mL 11   Current Facility-Administered Medications on File Prior to Visit  Medication Dose Route Frequency Provider Last Rate Last Admin  . pneumococcal 23 valent vaccine (PNU-IMMUNE) injection 0.5 mL  0.5 mL Intramuscular Once Nyoka Cowden, MD        Allergies  Allergen Reactions  . Morphine Other (See Comments)    Difficulty breathing  . Morphine And Related Shortness Of Breath  . Alphagan [Brimonidine]     Eyelid swelling, scratching Allergic to preservative contained in this med  . Codeine Nausea And Vomiting  . Dorzolamide Hcl-Timolol Mal     Inflammation of the eyelid, scratching Allergic to preservative contained in this med  . Hydrocodone-Acetaminophen Nausea And Vomiting    But tolerates tylenol  . Sulfa Antibiotics Rash    "Large bumps"  . Sulfamethoxazole Rash      Assessment/Plan:  1. Hypertension - BP is elevated and above goal of <130/80 mmHg. Continue amlodipine 10 mg daily, losartan 50 mg daily, and chlorthalidone 12.5 mg daily. Pending BMET today, will plan to increase chlorthalidone to 25 mg daily. Reviewed proper BP measuring technique. Educated patient on the importance of maintaining cardiac healthy diet low in sodium and regular physical activity. Patient will increase her physical activity as able. Will review BMET results and finalize medication changes via phone call tomorrow and schedule 2-week follow-up appointment at that time.  2. Hyperlipidemia - LDL is elevated and above goal <70 mg/dL at last check on 96/7/89. Continue rosuvastatin 20 mg daily and ezetimibe 10 mg daily. Patient is fasting today, so will check fasting lipid panel and LFTs today per patient request since it has been 5 weeks since changes were made to lipid-lowering medication regimen and since she is not able to make her original lab appointment in January.   Tama Headings, PharmD PGY2 Cardiology Pharmacy Resident

## 2020-05-26 NOTE — Patient Instructions (Addendum)
It was nice to see you today!  Your blood pressure goal is less than 130/53mmHg  Continue taking chlorthalidone 12.5 mg (HALF-tablet) once daily in the morning, amlodipine 10 mg daily and losartan 50 mg daily  If your labs are stable today, we will plan to increase chlorthalidone. We will give you a call when the labs come back so that we can finalize this plan and schedule follow up  Please continue to monitor your blood pressure at home. Bring your readings to your next appointment.  Please call us at [phone] if you have any questions

## 2020-05-27 ENCOUNTER — Telehealth: Payer: Self-pay | Admitting: Pharmacist

## 2020-05-27 ENCOUNTER — Ambulatory Visit: Payer: Medicare PPO | Admitting: Neurology

## 2020-05-27 ENCOUNTER — Ambulatory Visit: Payer: Medicare PPO | Admitting: Family Medicine

## 2020-05-27 DIAGNOSIS — G43719 Chronic migraine without aura, intractable, without status migrainosus: Secondary | ICD-10-CM

## 2020-05-27 LAB — HEPATIC FUNCTION PANEL
ALT: 31 IU/L (ref 0–32)
AST: 39 IU/L (ref 0–40)
Albumin: 4.6 g/dL (ref 3.8–4.8)
Alkaline Phosphatase: 104 IU/L (ref 44–121)
Bilirubin Total: 0.3 mg/dL (ref 0.0–1.2)
Bilirubin, Direct: 0.12 mg/dL (ref 0.00–0.40)
Total Protein: 7.7 g/dL (ref 6.0–8.5)

## 2020-05-27 LAB — BASIC METABOLIC PANEL
BUN/Creatinine Ratio: 20 (ref 12–28)
BUN: 13 mg/dL (ref 8–27)
CO2: 29 mmol/L (ref 20–29)
Calcium: 9.3 mg/dL (ref 8.7–10.3)
Chloride: 96 mmol/L (ref 96–106)
Creatinine, Ser: 0.64 mg/dL (ref 0.57–1.00)
GFR calc Af Amer: 107 mL/min/{1.73_m2} (ref >59)
GFR calc non Af Amer: 93 mL/min/{1.73_m2} (ref >59)
Glucose: 93 mg/dL (ref 65–99)
Potassium: 4.4 mmol/L (ref 3.5–5.2)
Sodium: 137 mmol/L (ref 134–144)

## 2020-05-27 LAB — LIPID PANEL
Chol/HDL Ratio: 3 ratio (ref 0.0–4.4)
Cholesterol, Total: 216 mg/dL — ABNORMAL HIGH (ref 100–199)
HDL: 73 mg/dL (ref >39)
LDL Chol Calc (NIH): 117 mg/dL — ABNORMAL HIGH (ref 0–99)
Triglycerides: 152 mg/dL — ABNORMAL HIGH (ref 0–149)
VLDL Cholesterol Cal: 26 mg/dL (ref 5–40)

## 2020-05-27 MED ORDER — CHLORTHALIDONE 25 MG PO TABS: 25.0000 mg | ORAL_TABLET | Freq: Every day | ORAL | 11 refills | Status: DC

## 2020-05-27 NOTE — Telephone Encounter (Signed)
Patient returned call. Informed patient that her kidney function and electrolytes were normal. We will increase chlorthalidone to 25mg  (1 tablet) daily. Follow up apt scheduled for 11/22 @ 1:30PM.  Also informed pt that her LDL was a little high, but we would discuss in follow up apt.

## 2020-05-27 NOTE — Progress Notes (Signed)
Botox-100unitsx2 vials Lot: 1) [id number]        2) [id number] Expiration: 08/2022 NDC: 5176-1607-37   0.9% Sodium Chloride- 19mL total Lot: [id number] Expiration: 04/2022 NDC: 85462-703-50  Dx: Chronic Migraine B/B   Consent signed

## 2020-05-27 NOTE — Progress Notes (Signed)
She continues to do well on Botox therapy. Sumatriptan injections and Fioricet help with migraine abortion. She continues Dilantin ER 300mg  for seizures.  Reviewed   Consent Form Botulism Toxin Injection For Chronic Migraine    Reviewed orally with patient, additionally signature is on file:  Botulism toxin has been approved by the Federal drug administration for treatment of chronic migraine. Botulism toxin does not cure chronic migraine and it may not be effective in some patients.  The administration of botulism toxin is accomplished by injecting a small amount of toxin into the muscles of the neck and head. Dosage must be titrated for each individual. Any benefits resulting from botulism toxin tend to wear off after 3 months with a repeat injection required if benefit is to be maintained. Injections are usually done every 3-4 months with maximum effect peak achieved by about 2 or 3 weeks. Botulism toxin is expensive and you should be sure of what costs you will incur resulting from the injection.  The side effects of botulism toxin use for chronic migraine may include:   -Transient, and usually mild, facial weakness with facial injections  -Transient, and usually mild, head or neck weakness with head/neck injections  -Reduction or loss of forehead facial animation due to forehead muscle weakness  -Eyelid drooping  -Dry eye  -Pain at the site of injection or bruising at the site of injection  -Double vision  -Potential unknown long term risks   Contraindications: You should not have Botox if you are pregnant, nursing, allergic to albumin, have an infection, skin condition, or muscle weakness at the site of the injection, or have myasthenia gravis, Lambert-Eaton syndrome, or ALS.  It is also possible that as with any injection, there may be an allergic reaction or no effect from the medication. Reduced effectiveness after repeated injections is sometimes seen and rarely infection at  the injection site may occur. All care will be taken to prevent these side effects. If therapy is given over a long time, atrophy and wasting in the muscle injected may occur. Occasionally the patient's become refractory to treatment because they develop antibodies to the toxin. In this event, therapy needs to be modified.  I have read the above information and consent to the administration of botulism toxin.    BOTOX PROCEDURE NOTE FOR MIGRAINE HEADACHE  Contraindications and precautions discussed with patient(above). Aseptic procedure was observed and patient tolerated procedure. Procedure performed by , FNP-C.   The condition has existed for more than 6 months, and pt does not have a diagnosis of ALS, Myasthenia Gravis or Lambert-Eaton Syndrome.  Risks and benefits of injections discussed and pt agrees to proceed with the procedure.  Written consent obtained  These injections are medically necessary. Pt  receives good benefits from these injections. These injections do not cause sedations or hallucinations which the oral therapies may cause.   Description of procedure:  The patient was placed in a sitting position. The standard protocol was used for Botox as follows, with 5 units of Botox injected at each site:  -Procerus muscle, NOT injected  -Corrugator muscle, NOT injected  -Frontalis muscle, bilateral injection, with 2 sites each side, medial injection was performed in the upper one third of the frontalis muscle, in the region vertical from the medial inferior edge of the superior orbital rim. The lateral injection was again in the upper one third of the forehead vertically above the lateral limbus of the cornea, 1.5 cm lateral to the medial injection  site.  -Temporalis muscle injection, 4 sites, bilaterally. The first injection was 3 cm above the tragus of the ear, second injection site was 1.5 cm to 3 cm up from the first injection site in line with the tragus of the ear.  The third injection site was 1.5-3 cm forward between the first 2 injection sites. The fourth injection site was 1.5 cm posterior to the second injection site. 5th site laterally in the temporalis  muscleat the level of the outer canthus.  -Occipitalis muscle injection, 3 sites, bilaterally. The first injection was done one half way between the occipital protuberance and the tip of the mastoid process behind the ear. The second injection site was done lateral and superior to the first, 1 fingerbreadth from the first injection. The third injection site was 1 fingerbreadth superiorly and medially from the first injection site.  -Cervical paraspinal muscle injection, 2 sites, bilaterally. The first injection site was 1 cm from the midline of the cervical spine, 3 cm inferior to the lower border of the occipital protuberance. The second injection site was 1.5 cm superiorly and laterally to the first injection site.  -Trapezius muscle injection was performed at 3 sites, bilaterally. The first injection site was in the upper trapezius muscle halfway between the inflection point of the neck, and the acromion. The second injection site was one half way between the acromion and the first injection site. The third injection was done between the first injection site and the inflection point of the neck.   Will return for repeat injection in 3 months.   A total of 200 units of Botox was prepared, 140 units of Botox was injected as documented above, any Botox not injected was wasted. The patient tolerated the procedure well, there were no complications of the above procedure.

## 2020-05-27 NOTE — Telephone Encounter (Signed)
Called patient for office visit follow-up and left message to call back. BMET is stable and WNL, so she can increase chlorthalidone to 25 mg (one full tablet) daily as discussed during office visit yesterday. Sent in new Rx to [facility] on [address].  LDL is elevated and above goal at 117 after switching low-dose atorvastatin to rosuvastatin 20 mg daily. She also takes ezetimibe 10 mg daily. LFTs are WNL. Continue current medications for now and will discuss additional lipid-lowering medications at follow-up visit.  Need to schedule 2-week HTN and lipid follow-up appointment.

## 2020-06-05 DIAGNOSIS — H6123 Impacted cerumen, bilateral: Secondary | ICD-10-CM | POA: Diagnosis not present

## 2020-06-09 ENCOUNTER — Other Ambulatory Visit: Payer: Self-pay

## 2020-06-09 ENCOUNTER — Encounter: Payer: Self-pay | Admitting: Pharmacist

## 2020-06-09 ENCOUNTER — Ambulatory Visit (INDEPENDENT_AMBULATORY_CARE_PROVIDER_SITE_OTHER): Payer: Medicare PPO | Admitting: Pharmacist

## 2020-06-09 VITALS — BP 144/90 | HR 76

## 2020-06-09 DIAGNOSIS — I10 Essential (primary) hypertension: Secondary | ICD-10-CM

## 2020-06-09 DIAGNOSIS — H401112 Primary open-angle glaucoma, right eye, moderate stage: Secondary | ICD-10-CM | POA: Diagnosis not present

## 2020-06-09 DIAGNOSIS — I700 Atherosclerosis of aorta: Secondary | ICD-10-CM

## 2020-06-09 DIAGNOSIS — H401123 Primary open-angle glaucoma, left eye, severe stage: Secondary | ICD-10-CM | POA: Diagnosis not present

## 2020-06-09 DIAGNOSIS — H30102 Unspecified disseminated chorioretinal inflammation, left eye: Secondary | ICD-10-CM | POA: Diagnosis not present

## 2020-06-09 DIAGNOSIS — Z79899 Other long term (current) drug therapy: Secondary | ICD-10-CM | POA: Diagnosis not present

## 2020-06-09 MED ORDER — ROSUVASTATIN CALCIUM 40 MG PO TABS: 40.0000 mg | ORAL_TABLET | Freq: Every day | ORAL | 1 refills | Status: DC

## 2020-06-09 NOTE — Progress Notes (Signed)
Patient ID: Brianna [name]                 DOB: [birth year]                    MRN: [id number]     HPI: Brianna [name] is a 69 y.o. female patient referred to lipid clinic by Dr Anne Fu. PMH is significant for HTN, HLD, PVD, RA, migraines, arthritis, and hx stroke. Pulmonary artery trunk noted to be enlarged on CT scan, indicative of possible underlying PH. Coronary artery calcifications also noted on CT scan.  On 04/18/20, she was seen by Dr. Anne Fu and BP was 140/100, HR 74. She was started on losartan 50 mg daily. Her LDL was also elevated on atorvastatin 20 mg TIW and ezetimibe 10 mg daily, so atorvastatin was changed to rosuvastatin 20 mg daily for better LDL control. She was on a lower dose of atorvastatin due to contraindication with cyclosporine (increases statin concentration). Rosuvastatin is not contraindicated with cyclosporine but requires closer monitoring of LFTs.  On 05/06/20, she reported elevated home BP readings (177/98, 180/106, 164/107, 160/114). She was not taking her clonidine because she thought she was supposed to stop taking it, so she was instructed to restart.  Patient arrives today for initial visit. She denies any adverse effects since starting losartan. She did restart her clonidine last week but has not been able to measure her BP since then. She states that she was on amlodipine 5 mg daily for years prior to increasing it to 10 mg and started clonidine in April/May of this year. Her PCP made these medication changes and she is not sure why clonidine was the agent of choice. She complains of severe dry mouth and extreme fatigue since starting clonidine, and her fatigue requires her to drink an energy drink like Red Bull in addition to her 3-4 8-oz cups of coffee each day. She also noticed a significant loss of appetite since starting clonidine and only eats dinner.  She enjoys salty foods and adds salt to her food, but she is willing to try using Mrs. DASH to decrease  salt intake. She will also consider decreasing her energy drink intake if her fatigue improves. Her physical activity is limited given her vision problems, but she would like to start going to the gym and go on walks more frequently with her husband.  Patient states that she took rosuvastatin many years ago but her doctor at the time stopped it due to thigh myalgias. She reports that she is tolerating rosuvastatin and ezetimibe well but would like closer monitoring of lipids/LFTs given possible risk with cyclosporine.  Patient presents today in good spirits.  Forgot blood pressure log but thinks her BP this morning was 135/85.  Has not had a systolic BP less than 130.  Reports two episodes of dizziness in last week.  Was not able to check blood pressure at that time.  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 Is scared to go the gym until her vision is better.  Drinks coffee throughout the day, 2 cups in the morning one at lunch and one at dinner.  Takes meds in morning, has to get up during the night to urinate.  Is currently on rosuvastatin 20 mg and has not been titrated up due to possible interaction with leflunamide.   Current Cholesterol Medications: rosuvastatin 20 mg, zetia 10 mg  Current HTN  Medications: amlodipine 10mg , losartan 50 mg, chlorthalidone 25mg   LDL goal: <70  Diet: Adds a minimal amount of salt to food.  Drinks 4 cups of coffee a day.  Steak once a month.  Tuna, salmon, pancakes, hamburgers, salads.  Does not eat breakfast  Exercise: walks dog  Social History: former smoker, no alcohol  Labs: TC 216, HDL 73, LDL 117, Trigs 152 (05/26/20 on crestor 20 and Zetia 10)  Past Medical History:  Diagnosis Date   Arthritis    in the neck   Carotid artery occlusion    Epilepsy (HCC)    GERD (gastroesophageal reflux disease)    Glaucoma    Hyperlipidemia    Hypertension    Keratoconus of both eyes 1981   Macular  degeneration    Migraines    Neuromuscular disorder (HCC)    RA   Peripheral vascular disease (HCC)    carotid occlusion surgery on left   Raynaud's disease    Retinal edema    Rheumatoid arthritis(714.0)    Stroke (HCC)    two mild strokes presumed from left carotid stenosis    Current Outpatient Medications on File Prior to Visit  Medication Sig Dispense Refill   amLODipine (NORVASC) 10 MG tablet      Ascorbic Acid 500 MG CAPS Take by mouth.     aspirin 325 MG tablet Take 325 mg by mouth daily.     botulinum toxin Type A (BOTOX) 100 units SOLR injection Inject IM into the head and neck muscles every 3 months by provider in office 2 vial 3   butalbital-acetaminophen-caffeine (FIORICET, ESGIC) 50-325-40 MG tablet Take 1 tablet by mouth every 6 (six) hours as needed for headache. 10 tablet 4   Calcium Carbonate-Vit D-Min (CALCIUM 1200 PO) Take 2 capsules by mouth daily.       chlorthalidone (HYGROTON) 25 MG tablet Take 1 tablet (25 mg total) by mouth daily. 30 tablet 11   Cholecalciferol (VITAMIN D PO) Take 1,000 Units by mouth daily.      cycloSPORINE modified (NEORAL) 50 MG capsule Take by mouth daily.     Difluprednate (DUREZOL) 0.05 % EMUL Place 1 drop into both eyes daily.     DILANTIN 100 MG ER capsule TAKE 3 CAPSULES BY MOUTH DAILY 270 capsule 3   ezetimibe (ZETIA) 10 MG tablet Take 10 mg by mouth daily.       famotidine (PEPCID) 20 MG tablet TAKE 1 TABLET(20 MG) BY MOUTH AT BEDTIME 90 tablet 1   HUMIRA PEN 40 MG/0.4ML PNKT Inject 40 mg into the skin every 14 (fourteen) days. As directed     hypromellose (GENTEAL) 0.3 % GEL ophthalmic ointment as needed.     leflunomide (ARAVA) 20 MG tablet Take 20 mg by mouth daily.     LORazepam (ATIVAN) 0.5 MG tablet Take 0.5 mg by mouth daily as needed for anxiety or sleep.      losartan (COZAAR) 50 MG tablet Take 1 tablet (50 mg total) by mouth daily. 90 tablet 3   pantoprazole (PROTONIX) 40 MG tablet TAKE 1  TABLET(40 MG) BY MOUTH DAILY 90 tablet 0   promethazine (PHENERGAN) 25 MG tablet Take 1 tablet (25 mg total) by mouth every 6 (six) hours as needed for nausea or vomiting. 30 tablet 11   RESTASIS 0.05 % ophthalmic emulsion 2 (two) times daily.     rosuvastatin (CRESTOR) 20 MG tablet Take 1 tablet (20 mg total) by mouth daily. 90 tablet 3   SUMAtriptan (IMITREX)  6 MG/0.5ML SOLN injection Inject 0.5 mLs (6 mg total) into the skin as needed for migraine. 4 mL 11   Current Facility-Administered Medications on File Prior to Visit  Medication Dose Route Frequency Provider Last Rate Last Admin   pneumococcal 23 valent vaccine (PNU-IMMUNE) injection 0.5 mL  0.5 mL Intramuscular Once Nyoka Cowden, MD        Allergies  Allergen Reactions   Morphine Other (See Comments)    Difficulty breathing   Morphine And Related Shortness Of Breath   Alphagan [Brimonidine]     Eyelid swelling, scratching Allergic to preservative contained in this med   Codeine Nausea And Vomiting   Dorzolamide Hcl-Timolol Mal     Inflammation of the eyelid, scratching Allergic to preservative contained in this med   Hydrocodone-Acetaminophen Nausea And Vomiting    But tolerates tylenol   Sulfa Antibiotics Rash    "Large bumps"   Sulfamethoxazole Rash    Assessment/Plan:  1. Hyperlipidemia - Patient LDL 117 which is above goal despite rosuvastatin 20mg  and Zetia 10mg .  Patient is willing to increase rosuvastatin to 40mg . Drug interaction with lefluonomide states that Concurrent use of ROSUVASTATIN and LEFLUNOMIDE AND METABOLITES may result in increased rosuvastatin exposure which we can maybe use to our advantage.  Instructed patient to call if she has increased myalgias  Stop rosuvastatin 20mg  daily Start rosuvastatin 40mg  daily Continue Zetia 10mg    2. Hypertension - Patient's BP in room today 144/90 which is above goal of <130/80.  Unclear what home readings are at this time. Concern over patient's  dizziness and its severity.  Will have patient monitor BP readings for 1 more week and see if she has another episode.  If no further episodes and home BP readings above goal, consider increasing losartan to 100mg .    Recommended patient reduce caffeine intake, especially at night if she is unable to sleep  Continue losartan 50mg  daily Continue chlorthalidone 25 mg daily  Patient will contact in 1 week with updated home BP readings   , PharmD, BCACP, CDCES [facility] [address], [place],  Phone: [phone]; Fax: [phone] 06/09/2020 5:06 PM

## 2020-06-09 NOTE — Patient Instructions (Signed)
It was good meeting you today!  We would like your blood pressure to be less than 130/80 and your LDL (bad cholesterol) to be less than 70  We will increase your rosuvastatin to 40mg  once daily  Because of your dizziness, I would like you to continue to monitor your blood pressure at home and let know if you have any more dizzy spells  Please call with any questions  [place], PharmD, Laural Golden, CDCES [facility] [address] [place], [place] Phone: [phone]; Fax: [phone] 06/09/2020 2:30 PM

## 2020-06-18 DIAGNOSIS — H401123 Primary open-angle glaucoma, left eye, severe stage: Secondary | ICD-10-CM | POA: Diagnosis not present

## 2020-06-18 DIAGNOSIS — H30033 Focal chorioretinal inflammation, peripheral, bilateral: Secondary | ICD-10-CM | POA: Diagnosis not present

## 2020-06-18 DIAGNOSIS — T868412 Corneal transplant failure, left eye: Secondary | ICD-10-CM | POA: Diagnosis not present

## 2020-06-18 DIAGNOSIS — H30102 Unspecified disseminated chorioretinal inflammation, left eye: Secondary | ICD-10-CM | POA: Diagnosis not present

## 2020-06-18 DIAGNOSIS — H401112 Primary open-angle glaucoma, right eye, moderate stage: Secondary | ICD-10-CM | POA: Diagnosis not present

## 2020-06-18 DIAGNOSIS — Z947 Corneal transplant status: Secondary | ICD-10-CM | POA: Diagnosis not present

## 2020-06-23 ENCOUNTER — Other Ambulatory Visit: Payer: Self-pay

## 2020-06-23 ENCOUNTER — Ambulatory Visit: Payer: Medicare PPO | Admitting: Internal Medicine

## 2020-06-23 ENCOUNTER — Encounter: Payer: Self-pay | Admitting: Internal Medicine

## 2020-06-23 DIAGNOSIS — R079 Chest pain, unspecified: Secondary | ICD-10-CM

## 2020-06-23 DIAGNOSIS — R059 Cough, unspecified: Secondary | ICD-10-CM | POA: Diagnosis not present

## 2020-06-23 DIAGNOSIS — J8410 Pulmonary fibrosis, unspecified: Secondary | ICD-10-CM | POA: Diagnosis not present

## 2020-06-23 LAB — CBC WITH DIFFERENTIAL/PLATELET
Basophils Absolute: 0.1 10*3/uL (ref 0.0–0.1)
Basophils Relative: 1.6 % (ref 0.0–3.0)
Eosinophils Absolute: 0.5 10*3/uL (ref 0.0–0.7)
Eosinophils Relative: 8.3 % — ABNORMAL HIGH (ref 0.0–5.0)
HCT: 40.6 % (ref 36.0–46.0)
Hemoglobin: 13.4 g/dL (ref 12.0–15.0)
Lymphocytes Relative: 19.6 % (ref 12.0–46.0)
Lymphs Abs: 1.1 10*3/uL (ref 0.7–4.0)
MCHC: 32.9 g/dL (ref 30.0–36.0)
MCV: 88 fl (ref 78.0–100.0)
Monocytes Absolute: 1 10*3/uL (ref 0.1–1.0)
Monocytes Relative: 17 % — ABNORMAL HIGH (ref 3.0–12.0)
Neutro Abs: 3 10*3/uL (ref 1.4–7.7)
Neutrophils Relative %: 53.5 % (ref 43.0–77.0)
Platelets: 243 10*3/uL (ref 150.0–400.0)
RBC: 4.62 Mil/uL (ref 3.87–5.11)
RDW: 14.1 % (ref 11.5–15.5)
WBC: 5.6 10*3/uL (ref 4.0–10.5)

## 2020-06-23 NOTE — Patient Instructions (Addendum)
GERD (REFLUX)  is an extremely common cause of respiratory symptoms just like yours , many times with no obvious heartburn at all.    It can be treated with medication, but also with lifestyle changes including elevation of the head of your bed (ideally with 6 -8inch blocks under the headboard of your bed),  Smoking cessation, avoidance of late meals, excessive alcohol, and avoid fatty foods, chocolate, peppermint, colas, red wine, and acidic juices such as orange juice.  NO MINT OR MENTHOL PRODUCTS SO NO COUGH DROPS  USE SUGARLESS CANDY INSTEAD (Jolley ranchers or Stover's or Life Savers) or even ice chips will also do - the key is to swallow to prevent all throat clearing. NO OIL BASED VITAMINS - use powdered substitutes.  Avoid fish oil when coughing.   We will call to schedule a sinus ct next   Try allegra or clariton over  the counter to see if helps your sensation of post nasal drainage   Make sure you check your oxygen saturations at highest level of activity to be sure it stays over 90% and keep track of it at least once a week, more often if breathing getting worse, and let me know if losing ground.   Please remember to go to the lab department   for your tests - we will call you with the results when they are available.       Please schedule a follow up visit in 3 months but call sooner if needed

## 2020-06-23 NOTE — Assessment & Plan Note (Addendum)
Assoc with RA      - PFT's 06/15/2006 FEV1 (1.53)  Ratio 84 and DLC0 46 corrects to 90%     - PFT's  03/04/2011  FEV1 (1.73)  Ratio 83 and VC 2.11 and DLC0 63% corrects  94%     - PFT's 11/13/2013    VC 1.94 with DLCO 65 corrects to 102%     - 08/27/2013  Walked RA x 2 laps @ 185 ft each stopped due to  Oximeter stopped recording accurately but sats still 96% and no sob - PFT's  06/14/2017  FVC 1.71  (60 % ) ratio 89  p 5 % improvement from saba p nothing prior to study with DLCO  59 % corrects to 97 % for alv volume   PFT's  06/21/2018  FVC  1.85 (65%)   p no % improvement from saba p nothing prior to study with DLCO  65 % corrects to 106  % for alv volume   PFT's  06/22/2019  FVC 1.68 (58 % )   with DLCO  12.93 (70%) corrects to 4.59 (108 %)  for alv volume and FV curve nl   CHEST  HRCT  03/03/20  1. Pulmonary parenchymal pattern of fibrosis appears grossly stable from 07/21/2010. Findings are consistent with UIP per consensus guidelines: Diagnosis of Idiopathic Pulmonary Fibrosis: An Official ATS/ERS/JRS/ALAT Clinical Practice Guideline. Am Rosezetta Schlatter Crit Care Med Vol 198, Iss 5, [phone], Mar 19 2017. 2. Aortic atherosclerosis (ICD10-I70.0). Coronary artery calcification. 3. Enlarged pulmonic trunk, indicative of pulmonary arterial hypertension.  - Echo   05/12/20  LVH nod / GII diastolic dysfunction /  PAS around 40 with nl RV -  06/23/2020   Walked RA  3laps @ approx 281ft each @ brisk pace  stopped due to end of study, no desats, no sob or CP with sats at end = 97%  No evidence of dz progression, key is to control systemic dz.  Advised stay active and:   Make sure you check your oxygen saturations at highest level of activity to be sure it stays over 90% and keep track of it at least once a week, more often if breathing getting worse, and let me know if losing ground.

## 2020-06-23 NOTE — Progress Notes (Signed)
Subjective:     Patient ID: Brianna [name], female   DOB: February 20, 1951,     MRN: [id number]       Brief patient profile:  69 yowf   quit smoking age 69 with no resp problems then @  36 bad pneumonia.  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   No sign doe but not that active.   Sleeping ok without nocturnal  or early am exac of resp c/o's or need for noct saba.  rec Ok to restart boniva the first of September and every month but if your respiratory or reflux symptoms worsen it's first medication I would stop and take in it's place Reclast IV yearly.    11/13/2013 f/u ov/[name] re:  PF assoc with RA Chief Complaint  Patient presents with  . Followup with PFT    Pt states that her breathing is unchanged since her last visit. No new co's today.    can now do 1st to 2nd floor s sob and working out regularly s limits due to sob rec  Return in one year for pfts and cxr and call sooner if not decline in tolerance > did not return       04/22/2017  [facility] office visit/ [name]  / re-establish re RA assoc ILD  Chief Complaint  Patient presents with  . Pulmonary Consult    Self referral for cough x 3-4 months. She is coughing up white to yellow sputum.    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  rec Augmentin 875 mg take one pill twice daily  X 10 days - take at breakfast and supper with large glass of water.  It would help reduce the usual side effects (diarrhea and yeast infections) if you ate cultured yogurt at lunch.  Prednisone 10 mg take  4 each am x 2 days,   2 each am x 2 days,  1 each am x 2 days and stop  Stop fosfamax for now  Pantoprazole (protonix) 40 mg   Take  30-60 min before first meal of the day and Pepcid (famotidine)  20 mg one @  bedtime until return to office - this is the best way to tell whether stomach acid is contributing to your problem.   GERD diet     06/14/2017  f/u ov/[name] re:  uacs ? Aggravated by fosfamax  Chief Complaint  Patient presents with  . Follow-up    PFT done today. Her cough is much improved. No new co's. She wants to discuss alternative med for bones since we stopped the fosamax.  cough still present somedays  p stirring in am / no pm cough at all which is quite an improvement s need for cough suppression Rates herself at 90% better  rec Restart fosmax 2 weeks before visit (= 2 doses ) prior to your visit with your rheumatologist Risk of reflux goes up on fosfamax so ideally I would recommend a substitute either Reclast or Prolia if feasible but defer the final call on this issue to your rheumatologist     06/21/2018  f/u ov/[name] re:  ? RA lung dz  Chief Complaint  Patient presents with  . Follow-up    PFT's done. Cough has resolved. No new co's.   Dyspnea:  Not limited by breathing from desired activities   Cough: none Sleeping: 20 degrees helps with nasal congestion SABA use: none 02: none  Systemic RA control good x has retinal involvement > f/u [facility]  rec Regular physical activity with monitoring of 02 saturations toward the end of exercise (nl values lower 90's) and call if trending down    06/22/2019  f/u ov/[name] re: ? RA lung dz  Chief Complaint   Patient presents with  . Follow-up    PFT done today. Increased dry cough in the am.    Dyspnea:  Not limited by breathing from desired activities  But not doing aerobics due to gym Cough: throat clearing assoc with nasal congestion x 3 m/ has not tried otcs Sleeping: 20 degrees HOB up  SABA use: none  02: none  rec Make sure you check your oxygen saturations at highest level of activity  Try zyrtec 10 mg at bedtime  (24 hour pill) to helps nasal symptoms if not consider ENT evaluation> did not use as risk of dry eyes Add:  Cough flared off ppi ac so add back if not better on zyrtec    06/23/2020  f/u ov/[name] re: ?  RA lung dz  Chief Complaint  Patient presents with  . Follow-up    Pulm fibrosis, Cough, still having dry cough, occ yellow mucus.Chest tightness with exertion  Dyspnea:  Walking the dog at brisk pace /  predictable R anterior cp when walks briskly up incline  Cough: throat clearing better in afternoon/ min yellow mucus/  Sleeping: 20-30 degrees  SABA use: none 02: none    No obvious day to day or daytime variability or assoc excess/ purulent sputum or mucus plugs or hemoptysis or   chest tightness, subjective wheeze or overt sinus or hb symptoms.   Sleeping as above without nocturnal  or early am exacerbation  of respiratory  c/o's or need for noct saba. Also denies any obvious fluctuation of symptoms with weather or environmental changes or other aggravating or alleviating factors except as outlined above   No unusual exposure hx or h/o childhood pna/ asthma or knowledge of premature birth.  Current Allergies, Complete Past Medical History, Past Surgical History, Family History, and Social History were reviewed in [employer] record.  ROS  The following are not active complaints unless bolded Hoarseness, sore throat, dysphagia, dental problems, itching, sneezing,  nasal congestion or sense of discharge of excess mucus or purulent secretions,  ear ache,   fever, chills, sweats, unintended wt loss or wt gain, classically pleuritic  cp,  orthopnea pnd or arm/hand swelling  or leg swelling, presyncope, palpitations, abdominal pain, anorexia, nausea, vomiting, diarrhea  or change in bowel habits or change in bladder habits, change in stools or change in urine, dysuria, hematuria,  rash, arthralgias, visual complaints  OD, headache, numbness, weakness or ataxia or problems with walking or coordination,  change in mood or  memory.        Current Meds  Medication Sig  . ACETAMINOPHEN-BUTALBITAL 50-325 MG TABS   . amLODipine (NORVASC) 10 MG tablet   . Ascorbic Acid 500 MG CAPS Take by mouth.  Marland Kitchen aspirin 325 MG tablet Take 325 mg by mouth daily.  . botulinum toxin Type A (BOTOX) 100 units SOLR injection Inject IM into the head and neck muscles every 3 months by provider in office  . butalbital-acetaminophen-caffeine (FIORICET, ESGIC) 50-325-40 MG tablet Take 1 tablet by mouth every 6 (six) hours as needed for headache.  . Calcium Carbonate-Vit D-Min (CALCIUM 1200 PO) Take 2 capsules by mouth daily.    . chlorthalidone (HYGROTON) 25 MG tablet Take 1 tablet (25 mg total) by mouth daily.  . Cholecalciferol (VITAMIN D PO) Take 1,000 Units by mouth daily.   . cloNIDine (CATAPRES) 0.1 MG tablet   . cycloSPORINE modified (NEORAL) 50 MG capsule Take by mouth daily.  . Difluprednate (DUREZOL) 0.05 % EMUL Place 1 drop into both eyes daily.  Marland Kitchen DILANTIN 100 MG ER capsule TAKE 3 CAPSULES BY MOUTH DAILY  . ezetimibe (ZETIA) 10 MG tablet Take 10 mg by mouth daily.    . famotidine (PEPCID) 20 MG tablet TAKE 1 TABLET(20 MG) BY MOUTH AT BEDTIME  . HUMIRA PEN 40 MG/0.4ML PNKT Inject 40 mg into the skin every 14 (fourteen) days. As directed  . hypromellose (GENTEAL) 0.3 % GEL ophthalmic ointment as needed.  . leflunomide (ARAVA) 20 MG tablet Take 20 mg by mouth daily.  Marland Kitchen LORazepam (ATIVAN) 0.5 MG tablet Take 0.5 mg by mouth daily as needed for anxiety or sleep.    Marland Kitchen losartan (COZAAR) 50 MG tablet Take 1 tablet (50 mg total) by mouth daily.  . pantoprazole (PROTONIX) 40 MG tablet TAKE 1 TABLET(40 MG) BY MOUTH DAILY  . promethazine (PHENERGAN) 25 MG tablet Take 1 tablet (25 mg total) by mouth every 6 (six) hours as needed for nausea or vomiting.  . RESTASIS 0.05 % ophthalmic emulsion 2 (two) times daily.  . rosuvastatin (CRESTOR) 40 MG tablet Take 1 tablet (40 mg total) by mouth daily.  . SUMAtriptan (IMITREX) 6 MG/0.5ML SOLN injection Inject 0.5 mLs (6 mg total) into the skin as needed for migraine.   Current Facility-Administered Medications for the 06/23/20 encounter (Office Visit) with Nyoka Cowden, MD  Medication  . pneumococcal 23 valent vaccine (PNU-IMMUNE) injection 0.5 mL             Objective:   Physical Exam      06/23/2020   122  06/22/2019  128  06/21/2018    126   Wt 130 01/14/2011 > 08/27/2013  126 >  11/13/2013 125 > 04/22/2017  125 > 06/14/2017   127 > 10/11/2017  124 >    Vital signs reviewed  06/23/2020  - Note at rest 02 sats  100% on RA   amb wf nad   HEENT : pt wearing mask not removed for exam due to covid -19 concerns.    NECK :  without JVD/Nodes/TM/ nl carotid upstrokes bilaterally   LUNGS: no acc muscle use,  Nl contour chest which is clear to A and P bilaterally without cough on insp or exp maneuvers   CV:  RRR  no s3 or murmur or increase in P2, and no edema   ABD:  soft and nontender with nl inspiratory  excursion in the supine position. No bruits or organomegaly appreciated, bowel sounds nl  MS:  Nl gait/ ext warm without deformities, calf tenderness, cyanosis or clubbing No obvious joint restrictions   SKIN: warm and dry without lesions    NEURO:  alert, approp, nl sensorium with  no motor or cerebellar deficits apparent.         I personally reviewed images and agree with radiology impression as follows:   Chest  HRCT  03/03/20  1. Pulmonary parenchymal pattern of fibrosis appears grossly  stable from 07/21/2010. Findings are consistent with UIP per consensus guidelines:   2. Aortic atherosclerosis (ICD10-I70.0). Coronary artery calcification. 3. Enlarged pulmonic trunk, indicative of pulmonary arterial hypertension.          Assessment:

## 2020-06-24 ENCOUNTER — Encounter: Payer: Self-pay | Admitting: Internal Medicine

## 2020-06-24 DIAGNOSIS — R079 Chest pain, unspecified: Secondary | ICD-10-CM | POA: Insufficient documentation

## 2020-06-24 LAB — IGE: IgE (Immunoglobulin E), Serum: 59 kU/L (ref <=114)

## 2020-06-24 NOTE — Assessment & Plan Note (Signed)
-   Sinus CT  01/14/2011 > neg - Trial off fosfamax and on GERD rx 04/22/17 > improved 06/14/2017  - recurrent fall 2020 > try zyrtec and if not better add back ppi ac  - Allergy profile 06/23/2020 >  Eos 0. /  IgE   - Sinus CT ordered  Upper airway cough syndrome (previously labeled PNDS),  is so named because it's frequently impossible to sort out how much is  CR/sinusitis with freq throat clearing (which can be related to primary GERD)   vs  causing  secondary (" extra esophageal")  GERD from wide swings in gastric pressure that occur with throat clearing, often  promoting self use of mint and menthol lozenges that reduce the lower esophageal sphincter tone and exacerbate the problem further in a cyclical fashion.   These are the same pts (now being labeled as having "irritable larynx syndrome" by some cough centers) who not infrequently have a history of having failed to tolerate ace inhibitors,  dry powder inhalers or biphosphonates or report having atypical/extraesophageal reflux symptoms that don't respond to standard doses of PPI  and are easily confused as having aecopd or asthma flares by even experienced allergists/ pulmonologists (myself included).   >>> proceed with w/u and try clariton or allegra as they are very unlikely to make her feel her eyes are too dry as have no anticholinergic side effects to speak of.

## 2020-06-24 NOTE — Assessment & Plan Note (Signed)
Reported 06/23/2020 x "months" but not reproducible in office or mentioned to Dr Anne Fu in Oct 2021 cardiology eval  "because she was told it was coming from her lungs"  The pain is reproducible walking briskly or uphill x months so notified Dr Anne Fu to make him aware.           Each maintenance medication was reviewed in detail including emphasizing most importantly the difference between maintenance and prns and under what circumstances the prns are to be triggered using an action plan format where appropriate.  Total time for H and P, chart review, counseling,  directly observing portions of ambulatory 02 saturation study/ and generating customized AVS unique to this office visit / charting = 30 min

## 2020-06-30 DIAGNOSIS — T868412 Corneal transplant failure, left eye: Secondary | ICD-10-CM | POA: Diagnosis not present

## 2020-06-30 DIAGNOSIS — H30102 Unspecified disseminated chorioretinal inflammation, left eye: Secondary | ICD-10-CM | POA: Diagnosis not present

## 2020-06-30 DIAGNOSIS — H401123 Primary open-angle glaucoma, left eye, severe stage: Secondary | ICD-10-CM | POA: Diagnosis not present

## 2020-06-30 DIAGNOSIS — Z947 Corneal transplant status: Secondary | ICD-10-CM | POA: Diagnosis not present

## 2020-07-01 DIAGNOSIS — H30033 Focal chorioretinal inflammation, peripheral, bilateral: Secondary | ICD-10-CM | POA: Diagnosis not present

## 2020-07-01 DIAGNOSIS — Z947 Corneal transplant status: Secondary | ICD-10-CM | POA: Diagnosis not present

## 2020-07-01 DIAGNOSIS — H401112 Primary open-angle glaucoma, right eye, moderate stage: Secondary | ICD-10-CM | POA: Diagnosis not present

## 2020-07-01 DIAGNOSIS — Z79899 Other long term (current) drug therapy: Secondary | ICD-10-CM | POA: Diagnosis not present

## 2020-07-01 DIAGNOSIS — H30102 Unspecified disseminated chorioretinal inflammation, left eye: Secondary | ICD-10-CM | POA: Diagnosis not present

## 2020-07-01 DIAGNOSIS — H401123 Primary open-angle glaucoma, left eye, severe stage: Secondary | ICD-10-CM | POA: Diagnosis not present

## 2020-07-01 DIAGNOSIS — H3581 Retinal edema: Secondary | ICD-10-CM | POA: Diagnosis not present

## 2020-07-01 DIAGNOSIS — H353221 Exudative age-related macular degeneration, left eye, with active choroidal neovascularization: Secondary | ICD-10-CM | POA: Diagnosis not present

## 2020-07-02 DIAGNOSIS — Z79899 Other long term (current) drug therapy: Secondary | ICD-10-CM | POA: Diagnosis not present

## 2020-07-03 ENCOUNTER — Inpatient Hospital Stay: Admission: RE | Admit: 2020-07-03 | Payer: Medicare PPO | Source: Ambulatory Visit

## 2020-07-04 ENCOUNTER — Other Ambulatory Visit: Payer: Self-pay

## 2020-07-04 ENCOUNTER — Ambulatory Visit (INDEPENDENT_AMBULATORY_CARE_PROVIDER_SITE_OTHER)
Admission: RE | Admit: 2020-07-04 | Discharge: 2020-07-04 | Disposition: A | Discharge status: Home / Self Care | Payer: Medicare PPO | Source: Ambulatory Visit | Attending: Internal Medicine | Admitting: Internal Medicine

## 2020-07-04 DIAGNOSIS — Z9889 Other specified postprocedural states: Secondary | ICD-10-CM | POA: Diagnosis not present

## 2020-07-04 DIAGNOSIS — R059 Cough, unspecified: Secondary | ICD-10-CM | POA: Diagnosis not present

## 2020-07-10 ENCOUNTER — Telehealth: Payer: Self-pay | Admitting: Internal Medicine

## 2020-07-10 NOTE — Progress Notes (Signed)
Called and left message on voicemail to please return phone call to go over results. Contact number provided. 

## 2020-07-10 NOTE — Telephone Encounter (Signed)
Called and spoke to pt. Informed her of the results and recs per MW. Pt verbalized understanding and denied any further questions or concerns at this time.    Call patient : Study is neg for sinusitis though the passages are narrowed, nothing to do differently for now - Be sure patient has/keeps f/u ov so we can go over all the details of this study and get a plan together moving forward - ok to move up f/u if not feeling better and wants to be seen sooner

## 2020-07-15 DIAGNOSIS — R7301 Impaired fasting glucose: Secondary | ICD-10-CM | POA: Diagnosis not present

## 2020-07-15 DIAGNOSIS — I10 Essential (primary) hypertension: Secondary | ICD-10-CM | POA: Diagnosis not present

## 2020-07-15 DIAGNOSIS — G40909 Epilepsy, unspecified, not intractable, without status epilepticus: Secondary | ICD-10-CM | POA: Diagnosis not present

## 2020-07-15 DIAGNOSIS — E559 Vitamin D deficiency, unspecified: Secondary | ICD-10-CM | POA: Diagnosis not present

## 2020-07-15 DIAGNOSIS — Z0000 Encounter for general adult medical examination without abnormal findings: Secondary | ICD-10-CM | POA: Diagnosis not present

## 2020-07-17 NOTE — Progress Notes (Signed)
Office Visit Note  [name]             Date of Birth: [birth year]           MRN: [id number]             PCP: Irena Reichmann, DO Referring: Irena Reichmann, DO Visit Date: 07/31/2020 Occupation: @GUAROCC @  Subjective:  Medication management   History of Present Illness: Brianna [name] is a 69 y.o. female with history of rheumatoid arthritis and arthritis.  She has been followed by Dr. 79 from [facility].  She states she that she was taken off the cyclosporine due to elevation in her blood pressure and was started on tacrolimus in December.  She has been taking tacrolimus 1 mg p.o. twice daily since then without any side effects.  She has noticed some improvement in her eyes.  She denies any joint swelling on Humira and Arava combination which she has been tolerating well.  She has been off Reclast because she will be getting dental implant.  She is interested in starting on Forteo subcutaneous injections.  She continues to have some lower back discomfort and some discomfort from trochanteric bursitis.  She has been doing stretching exercises.  Activities of Daily Living:  Patient reports morning stiffness for 0 minutes.   Patient Denies nocturnal pain.  Difficulty dressing/grooming: Denies Difficulty climbing stairs: Denies Difficulty getting out of chair: Denies Difficulty using hands for taps, buttons, cutlery, and/or writing: Reports  Review of Systems  Constitutional: Positive for fatigue.  HENT: Positive for mouth dryness and nose dryness. Negative for mouth sores.   Eyes: Positive for pain and dryness. Negative for redness and itching.  Respiratory: Negative for shortness of breath and difficulty breathing.   Cardiovascular: Negative for chest pain and palpitations.  Gastrointestinal: Negative for blood in stool, constipation and diarrhea.  Endocrine: Negative for increased urination.  Genitourinary: Negative for difficulty urinating.   Musculoskeletal: Negative for arthralgias, joint pain, joint swelling, myalgias, morning stiffness, muscle tenderness and myalgias.  Skin: Positive for color change. Negative for rash and redness.  Allergic/Immunologic: Negative for susceptible to infections.  Neurological: Positive for headaches. Negative for dizziness, numbness, memory loss and weakness.  Hematological: Positive for bruising/bleeding tendency.  Psychiatric/Behavioral: Negative for confusion.    PMFS History:  Patient Active Problem List   Diagnosis Date Noted  . Exertional chest pain 06/24/2020  . Hypertension 05/12/2020  . URI, acute 05/19/2018  . Cerumen impaction 05/19/2018  . Trigger thumb, right thumb 10/05/2017  . History of macular degeneration 09/13/2016  . History of migraine 09/06/2016  . History of seizures 09/06/2016  . Iritis of right eye 05/12/2016  . Rheumatoid arthritis with positive rheumatoid factor (HCC) 05/11/2016  . 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  . High risk medication use 05/11/2016  . Osteoporosis 05/11/2016  . 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 History of carotid endarterectomy 06/27/2014  . Migraine without aura 04/19/2013  . Seizures (HCC) 04/19/2013  . 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 Cough 01/14/2011  . Pulmonary fibrosis (HCC) 01/14/2011    Past Medical History:  Diagnosis Date  . Arthritis    in the neck  . Carotid artery occlusion   . Epilepsy (HCC)   . GERD (gastroesophageal reflux disease)   . Glaucoma   . Hyperlipidemia   . Hypertension   . Keratoconus of both eyes 1981  . Macular degeneration   . Migraines   . Neuromuscular disorder (HCC)  RA  . Peripheral vascular disease (HCC)    carotid occlusion surgery on left  . Raynaud's disease   . Retinal edema   . Rheumatoid arthritis(714.0)   . Stroke [facility])    two mild strokes presumed from left carotid stenosis    Family History  Problem Relation Age of  Onset  . Heart disease Mother   . Lung disease Mother        ? disease process  . Uterine cancer Mother   . Heart disease Father   . Clotting disorder Father   . Collagen disease Father   . Cervical cancer Maternal Aunt   . Prostate cancer Maternal Grandfather   . Rheum arthritis Sister   . High Cholesterol Sister   . Epilepsy Sister   . Rheum arthritis Maternal Grandmother   . High Cholesterol Brother   . High blood pressure Brother   . High blood pressure Brother   . High Cholesterol Brother   . High blood pressure Brother   . High Cholesterol Brother    Past Surgical History:  Procedure Laterality Date  . CAROTID ARTERY ANGIOPLASTY  2008   Dr Madilyn Fireman  . CAROTID ENDARTERECTOMY Left 11-15-2007  . cataract extraction Right 04-2014   [facility]  . CHOLECYSTECTOMY  1995  . CORNEAL TRANSPLANT     x 5 ; steroid inj. retnal information  . 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 EYE SURGERY Right 12/2016   cornea repair   . EYE SURGERY Right 09/20/2017   cornea transplant   . GLAUCOMA SURGERY Right 2018  . TRIGGER FINGER RELEASE Right 05/16/2018   Procedure: RELEASE TRIGGER FINGER/A-1 PULLEY RIGHT THUMB;  Surgeon: Cindee Salt, MD;  Location: [facility];  Service: Orthopedics;  Laterality: Right;   Social History   Social History Narrative   Patient is married to [name]), has 2 children   Patient is right handed   Education level is Bachelor's degree   Caffeine consumption is 2 cups daily   Lives at home with husband    Immunization History  Administered Date(s) Administered  . Influenza Split 04/26/2013, 04/08/2017  . Influenza, High Dose Seasonal PF 04/09/2018  . PFIZER SARS-COV-2 Vaccination 08/24/2019, 09/18/2019, 05/12/2020  . Pneumococcal Conjugate-13 08/27/2013  . Pneumococcal Polysaccharide-23 01/14/2011  . Tdap 11/15/2017     Objective: Vital Signs: BP (!) 146/87 (BP Location: Left Arm,  Patient Position: Sitting, Cuff Size: Normal)   Pulse 77   Resp 14   Ht 5\' 2"  (1.575 m)   Wt 121 lb (54.9 kg)   BMI 22.13 kg/m    Physical Exam Vitals and nursing note reviewed.  Constitutional:      Appearance: She is well-developed and well-nourished.  HENT:     Head: Normocephalic and atraumatic.  Eyes:     Extraocular Movements: EOM normal.     Conjunctiva/sclera: Conjunctivae normal.  Cardiovascular:     Rate and Rhythm: Normal rate and regular rhythm.     Pulses: Intact distal pulses.     Heart sounds: Normal heart sounds.  Pulmonary:     Effort: Pulmonary effort is normal.     Breath sounds: Normal breath sounds.  Abdominal:     General: Bowel sounds are normal.     Palpations: Abdomen is soft.  Musculoskeletal:     Cervical back: Normal range of motion.  Lymphadenopathy:     Cervical: No cervical adenopathy.  Skin:    General:  Skin is warm and dry.     Capillary Refill: Capillary refill takes less than 2 seconds.  Neurological:     Mental Status: She is alert and oriented to person, place, and time.  Psychiatric:        Mood and Affect: Mood and affect normal.        Behavior: Behavior normal.      Musculoskeletal Exam: Spine, thoracic and lumbar spine range of motion.  Shoulder joints, elbow joints, wrist joints, MCPs PIPs and DIPs with good range of motion with no synovitis.  Hip joints, knee joints, ankles, MTPs and PIPs with good range of motion with no synovitis.  CDAI Exam: CDAI Score: 0  Patient Global: 0 mm; Provider Global: 0 mm Swollen: 0 ; Tender: 0  Joint Exam 07/31/2020   No joint exam has been documented for this visit   There is currently no information documented on the homunculus. Go to the Rheumatology activity and complete the homunculus joint exam.  Investigation: No additional findings.  Imaging: CT MAXILLOFACIAL LIMITED WO CONTRAST  Result Date: 07/04/2020 CLINICAL DATA:  Cough, persistent EXAM: CT PARANASAL SINUS LIMITED  WITHOUT CONTRAST TECHNIQUE: Non-contiguous multidetector CT images of the paranasal sinuses were obtained in a single plane without contrast. COMPARISON:  01/15/2011. FINDINGS: Paranasal sinuses: Frontal: Normally aerated. Ethmoid: Normally aerated. Maxillary: Normally aerated. Sphenoid: Normally aerated. Right ostiomeatal unit: Mild narrowing proximally secondary to mucosal thickening Left ostiomeatal unit: Mild narrowing proximally secondary to mucosal thickening. Nasal passages: Patent. Midline nasal septum. Other: Postprocedural appearance of the orbits. IMPRESSION: Clear paranasal sinuses. Mild proximal narrowing of the bilateral ostiomeatal units secondary to mucosal thickening. Otherwise patent sinonasal drainage pathways. Electronically Signed   By: Stana Bunting M.D.   On: 07/04/2020 08:59    Recent Labs: Lab Results  Component Value Date   WBC 5.6 06/23/2020   HGB 13.4 06/23/2020   PLT 243.0 06/23/2020   NA 137 05/26/2020   K 4.4 05/26/2020   CL 96 05/26/2020   CO2 29 05/26/2020   GLUCOSE 93 05/26/2020   BUN 13 05/26/2020   CREATININE 0.64 05/26/2020   BILITOT 0.3 05/26/2020   ALKPHOS 104 05/26/2020   AST 39 05/26/2020   ALT 31 05/26/2020   PROT 7.7 05/26/2020   ALBUMIN 4.6 05/26/2020   CALCIUM 9.3 05/26/2020   GFRAA 107 05/26/2020   QFTBGOLDPLUS NEGATIVE 05/31/2018   July 02, 2020 labs [facility] CBC and CMP were normal.  Speciality Comments: Last Reclast 11/30/2017   Procedures:  No procedures performed Allergies: Morphine, Morphine and related, Alphagan [brimonidine], Codeine, Dorzolamide hcl-timolol mal, Hydrocodone-acetaminophen, Sulfa antibiotics, and Sulfamethoxazole   Assessment / Plan:     Visit Diagnoses: Rheumatoid arthritis involving multiple sites with positive rheumatoid factor (HCC)-her rheumatoid arthritis is very well controlled with no synovitis.  Although she continues to have some problems with iritis and has been followed by  Dr. Sherryll Burger.  She was recently started on tacrolimus 1 mg p.o. twice daily.  She has been tolerating the medication well.  As her rheumatoid arthritis is well controlled I advised her to decrease Arva to half a tablet for a week and if she had no flares then she can stop the medication.  We will see how she does on the combination of Humira and tacrolimus.  High risk medication use -  Humira 40 mg sq every 14 days and Arava 20 mg po daily.  Her labs done at [facility] in December 2022 were reviewed which  were within normal limits.  We will continue to monitor labs every 3 months.  She is fully immunized against COVID-19.  She also received a booster.  Use of mask, social distancing and hand hygiene was discussed.  ILD (interstitial lung disease) (HCC) - She is followed by Dr. Sherene Sires.  She has been clinically stable and her PFTs were stable on 06/22/2019.    Iritis of right eye - followed by Dr.Shah, Dr. Dan Humphreys, and Dr. Lottie Dawson. She is on Humira 40 mg subcutaneous injections every 14 days, which is prescribed by Dr. Sherryll Burger.  She was on cyclosporine which was discontinued due to elevated blood pressure.  She is currently on tacrolimus 1 mg p.o. twice daily by Dr. Sherryll Burger.  Trochanteric bursitis of both hips - Resolved.   DDD (degenerative disc disease), lumbar-she continues to have some chronic discomfort.  Age-related osteoporosis without current pathological fracture - Most recent DEXA on 11/13/19: L Left femoral neck T-score -3.0, Right FN T-score -2.6, distal 1/2 left radius T-score -3.3.  She is concerned about ONJ and does not want to take Reclast anymore.  We will apply for Forteo.  History of glaucoma  History of seizures  History of carotid endarterectomy  History of migraine  History of macular degeneration  Orders: No orders of the defined types were placed in this encounter.  No orders of the defined types were placed in this encounter. .  Follow-Up Instructions: Return in about  3 months (around 10/29/2020) for Rheumatoid arthritis, Osteoarthritis, Osteoporosis.   Pollyann Savoy, MD  Note - This record has been created using Animal nutritionist.  Chart creation errors have been sought, but may not always  have been located. Such creation errors do not reflect on  the standard of medical care.

## 2020-07-21 ENCOUNTER — Other Ambulatory Visit: Payer: Medicare PPO

## 2020-07-23 ENCOUNTER — Ambulatory Visit: Payer: Medicare PPO | Admitting: Rheumatology

## 2020-07-28 ENCOUNTER — Other Ambulatory Visit: Payer: Self-pay

## 2020-07-28 ENCOUNTER — Ambulatory Visit: Payer: Medicare PPO | Admitting: Cardiology

## 2020-07-28 ENCOUNTER — Encounter: Payer: Self-pay | Admitting: Cardiology

## 2020-07-28 VITALS — BP 142/90 | HR 80 | Ht 62.0 in | Wt 121.0 lb

## 2020-07-28 DIAGNOSIS — I2510 Atherosclerotic heart disease of native coronary artery without angina pectoris: Secondary | ICD-10-CM | POA: Diagnosis not present

## 2020-07-28 DIAGNOSIS — I2584 Coronary atherosclerosis due to calcified coronary lesion: Secondary | ICD-10-CM | POA: Diagnosis not present

## 2020-07-28 DIAGNOSIS — E785 Hyperlipidemia, unspecified: Secondary | ICD-10-CM | POA: Diagnosis not present

## 2020-07-28 DIAGNOSIS — I10 Essential (primary) hypertension: Secondary | ICD-10-CM | POA: Diagnosis not present

## 2020-07-28 DIAGNOSIS — M069 Rheumatoid arthritis, unspecified: Secondary | ICD-10-CM | POA: Diagnosis not present

## 2020-07-28 MED ORDER — ROSUVASTATIN CALCIUM 20 MG PO TABS: 20.0000 mg | ORAL_TABLET | Freq: Every day | ORAL | 3 refills | Status: DC

## 2020-07-28 MED ORDER — LOSARTAN POTASSIUM 100 MG PO TABS: 100.0000 mg | ORAL_TABLET | Freq: Every day | ORAL | 3 refills | Status: DC

## 2020-07-28 NOTE — Patient Instructions (Signed)
Medication Instructions:  Please decrease your Crestor to 20 mg a day. Increase your Losartan to 100 mg a day. Continue all other medications as listed.  *If you need a refill on your cardiac medications before your next appointment, please call your pharmacy*  Please follow up in the Hypertension Clinic in 3 months.  Follow-Up: At [facility], you and your health needs are our priority.  As part of our continuing mission to provide you with exceptional heart care, we have created designated Provider Care Teams.  These Care Teams include your primary Cardiologist (physician) and Advanced Practice Providers (APPs -  Physician Assistants and Nurse Practitioners) who all work together to provide you with the care you need, when you need it.  We recommend signing up for the patient portal called "MyChart".  Sign up information is provided on this After Visit Summary.  MyChart is used to connect with patients for Virtual Visits (Telemedicine).  Patients are able to view lab/test results, encounter notes, upcoming appointments, etc.  Non-urgent messages can be sent to your provider as well.   To learn more about what you can do with MyChart, go to [email or web address].    Your next appointment:   6 month(s)  The format for your next appointment:   In Person  Provider:   Donato Schultz, MD   Thank you for choosing [facility]!!

## 2020-07-28 NOTE — Progress Notes (Signed)
Cardiology Office Note:    Date:  07/28/2020   ID:  Brianna [name], DOB [birth year], MRN [id number]  PCP:  Irena Reichmann, DO  [facility] Cardiologist:  Donato Schultz, MD  [facility] Electrophysiologist:  None   Referring MD: Irena Reichmann, DO    History of Present Illness:    Brianna [name] is a 70 y.o. female here for the follow-up of difficult to control hypertension originally seen on 04/18/2020.  Denied any chest pain.  Had mild shortness of breath.  Dr. Sherene Sires has seen in pulmonary.  Note reviewed.  Also had corneal transplant of left eye.  Pulmonary artery trunk was enlarged on CT scan 31 mm.  Indicative of underlying pulmonary hypertension.  Family history of father dying from MI.  Coronary artery calcifications noted on CT scan.  She used to be on statin therapy, atorvastatin however it conflicted with leflunomide, medication for retina she is now on it 3 days a week at 20 mg.  Now on Crestor 40. Would like her to be on 20 because of the conflict with leflunomide, it increasing serum concentrations  Overall doing okay today.  No major changes.  No fevers chills nausea vomiting syncope bleeding.  See below for details.  Past Medical History:  Diagnosis Date  . Arthritis    in the neck  . Carotid artery occlusion   . Epilepsy (HCC)   . GERD (gastroesophageal reflux disease)   . Glaucoma   . Hyperlipidemia   . Hypertension   . Keratoconus of both eyes 1981  . Macular degeneration   . Migraines   . Neuromuscular disorder (HCC)    RA  . Peripheral vascular disease (HCC)    carotid occlusion surgery on left  . Raynaud's disease   . Retinal edema   . Rheumatoid arthritis(714.0)   . Stroke [facility])    two mild strokes presumed from left carotid stenosis    Past Surgical History:  Procedure Laterality Date  . CAROTID ARTERY ANGIOPLASTY  2008   Dr Madilyn Fireman  . CAROTID ENDARTERECTOMY Left 11-15-2007  . cataract extraction Right 04-2014   [facility]  .  CHOLECYSTECTOMY  1995  . CORNEAL TRANSPLANT     x 5 ; steroid inj. retnal information  . CORNEAL TRANSPLANT Right 01-22-2014   [facility]  . 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 EYE SURGERY Right 12/2016   cornea repair   . EYE SURGERY Right 09/20/2017   cornea transplant   . GLAUCOMA SURGERY Right 2018  . TRIGGER FINGER RELEASE Right 05/16/2018   Procedure: RELEASE TRIGGER FINGER/A-1 PULLEY RIGHT THUMB;  Surgeon: Cindee Salt, MD;  Location: [facility];  Service: Orthopedics;  Laterality: Right;    Current Medications: Current Meds  Medication Sig  . ACETAMINOPHEN-BUTALBITAL 50-325 MG TABS Take by mouth daily as needed.  Marland Kitchen amLODipine (NORVASC) 10 MG tablet Take 10 mg by mouth daily.  . Ascorbic Acid 500 MG CAPS Take by mouth.  Marland Kitchen aspirin 325 MG tablet Take 325 mg by mouth daily.  . botulinum toxin Type A (BOTOX) 100 units SOLR injection Inject IM into the head and neck muscles every 3 months by provider in office  . Calcium Carbonate-Vit D-Min (CALCIUM 1200 PO) Take 2 capsules by mouth daily.  . chlorthalidone (HYGROTON) 25 MG tablet Take 1 tablet (25 mg total) by mouth daily.  . Cholecalciferol (VITAMIN D PO) Take 1,000 Units by mouth daily.   . cloNIDine (CATAPRES) 0.1 MG tablet Take 0.1 mg by  mouth daily.  . Difluprednate (DUREZOL) 0.05 % EMUL Place 1 drop into both eyes daily.  Marland Kitchen DILANTIN 100 MG ER capsule TAKE 3 CAPSULES BY MOUTH DAILY  . ezetimibe (ZETIA) 10 MG tablet Take 10 mg by mouth daily.  . famotidine (PEPCID) 20 MG tablet TAKE 1 TABLET(20 MG) BY MOUTH AT BEDTIME  . HUMIRA PEN 40 MG/0.4ML PNKT Inject 40 mg into the skin every 14 (fourteen) days. As directed  . hypromellose (GENTEAL) 0.3 % GEL ophthalmic ointment as needed.  . leflunomide (ARAVA) 20 MG tablet Take 20 mg by mouth daily.  Marland Kitchen LORazepam (ATIVAN) 0.5 MG tablet Take 0.5 mg by mouth daily as needed for anxiety or sleep.  Marland Kitchen losartan (COZAAR) 100 MG tablet Take 1 tablet (100 mg total) by mouth  daily.  . pantoprazole (PROTONIX) 40 MG tablet TAKE 1 TABLET(40 MG) BY MOUTH DAILY  . promethazine (PHENERGAN) 25 MG tablet Take 1 tablet (25 mg total) by mouth every 6 (six) hours as needed for nausea or vomiting.  . RESTASIS 0.05 % ophthalmic emulsion 2 (two) times daily.  . rosuvastatin (CRESTOR) 20 MG tablet Take 1 tablet (20 mg total) by mouth daily.  . SUMAtriptan (IMITREX) 6 MG/0.5ML SOLN injection Inject 0.5 mLs (6 mg total) into the skin as needed for migraine.  . [DISCONTINUED] losartan (COZAAR) 50 MG tablet Take 1 tablet (50 mg total) by mouth daily.  . [DISCONTINUED] rosuvastatin (CRESTOR) 40 MG tablet Take 1 tablet (40 mg total) by mouth daily.   Current Facility-Administered Medications for the 07/28/20 encounter (Office Visit) with Jake Bathe, MD  Medication  . pneumococcal 23 valent vaccine (PNU-IMMUNE) injection 0.5 mL     Allergies:   Morphine, Morphine and related, Alphagan [brimonidine], Codeine, Dorzolamide hcl-timolol mal, Hydrocodone-acetaminophen, Sulfa antibiotics, and Sulfamethoxazole   Social History   Socioeconomic History  . Marital status: Married    Spouse name: [name]  . Number of children: 2  . Years of education: Bachelor  . Highest education level: Not on file  Occupational History  . Occupation: Runner, broadcasting/film/video  . Occupation: [employer] TEACHER    Employer: [employer]  Tobacco Use  . Smoking status: Former Smoker    Packs/day: 0.50    Years: 14.00    Pack years: 7.00    Types: Cigarettes    Quit date: 07/19/1988    Years since quitting: 32.0  . Smokeless tobacco: Never Used  Vaping Use  . Vaping Use: Never used  Substance and Sexual Activity  . Alcohol use: No  . Drug use: No  . Sexual activity: Not on file  Other Topics Concern  . Not on file  Social History Narrative   Patient is married to [name]), has 2 children   Patient is right handed   Education level is Bachelor's degree   Caffeine consumption is 2 cups daily   Lives at home  with husband    Social Determinants of Health   Financial Resource Strain: Not on file  Food Insecurity: Not on file  Transportation Needs: Not on file  Physical Activity: Not on file  Stress: Not on file  Social Connections: Not on file     Family History: [name] family history includes Cervical cancer in her maternal aunt; Clotting disorder in her father; Collagen disease in her father; Epilepsy in her sister; Heart disease in her father and mother; High Cholesterol in her brother, brother, brother, and sister; High blood pressure in her brother, brother, and brother; Lung disease in her  mother; Prostate cancer in her maternal grandfather; Rheum arthritis in her maternal grandmother and sister; Uterine cancer in her mother.  ROS:   Please see the history of present illness.     All other systems reviewed and are negative.  EKGs/Labs/Other Studies Reviewed:    The following studies were reviewed today:  ECHO 05/12/20:   1. Left ventricular ejection fraction, by estimation, is 65 to 70%. The  left ventricle has normal function. The left ventricle has no regional  wall motion abnormalities. There is moderate concentric left ventricular  hypertrophy. Left ventricular  diastolic parameters are consistent with Grade II diastolic dysfunction  (pseudonormalization). Elevated left atrial pressure. The average left  ventricular global longitudinal strain is -23.3 %. The global longitudinal  strain is normal.  2. Right ventricular systolic function is normal. The right ventricular  size is normal. There is mildly elevated pulmonary artery systolic  pressure. The estimated right ventricular systolic pressure is 39.5 mmHg.  3. The mitral valve is normal in structure. Moderate mitral valve  regurgitation. No evidence of mitral stenosis. There is moderate  holosystolic prolapse of multiple scallops of the posterior leaflet of the  mitral valve.  4. The aortic valve is normal in  structure. Aortic valve regurgitation is  not visualized. No aortic stenosis is present.  5. The inferior vena cava is normal in size with greater than 50%  respiratory variability, suggesting right atrial pressure of 3 mmHg.  EKG:  EKG is  ordered today.  The ekg ordered today demonstrates  Recent Labs: 05/26/2020: ALT 31; BUN 13; Creatinine, Ser 0.64; Potassium 4.4; Sodium 137 06/23/2020: Hemoglobin 13.4; Platelets 243.0  Recent Lipid Panel    Component Value Date/Time   CHOL 216 (H) 05/26/2020 1607   TRIG 152 (H) 05/26/2020 1607   HDL 73 05/26/2020 1607   CHOLHDL 3.0 05/26/2020 1607   LDLCALC 117 (H) 05/26/2020 1607     Risk Assessment/Calculations:       Physical Exam:    VS:  BP (!) 142/90   Pulse 80   Ht 5\' 2"  (1.575 m)   Wt 121 lb (54.9 kg)   BMI 22.13 kg/m     Wt Readings from Last 3 Encounters:  07/28/20 121 lb (54.9 kg)  06/23/20 122 lb 9.6 oz (55.6 kg)  04/18/20 122 lb 12.8 oz (55.7 kg)     GEN:  Well nourished, well developed in no acute distress HEENT: Normal NECK: No JVD; No carotid bruits LYMPHATICS: No lymphadenopathy CARDIAC: RRR, soft systolic murmurs, rubs, gallops RESPIRATORY:  Clear to auscultation without rales, wheezing or rhonchi  ABDOMEN: Soft, non-tender, non-distended MUSCULOSKELETAL:  No edema; No deformity  SKIN: Warm and dry NEUROLOGIC:  Alert and oriented x 3 PSYCHIATRIC:  Normal affect   ASSESSMENT:    1. Coronary artery calcification   2. Primary hypertension   3. Hyperlipidemia, unspecified hyperlipidemia type   4. Rheumatoid arthritis of other site, unspecified whether rheumatoid factor present (HCC)    PLAN:    In order of problems listed above:  Difficulty control hypertension - At last clinic visit we started losartan 50 mg once a day.  I also asked her to go see the hypertension clinic where chlorthalidone 25 mg was initiated. Amlodipine 10 mg continued.  Blood pressure today 142/90.  We are going to increase her  losartan from 50 to 100 mg a day.  No longer on clonidine.  Hyperlipidemia - Prior LDL 98 on atorvastatin as well as Zetia.  Statins can interact  with leflunomide causing an increase in concentration of the statin.  Clinically however we are going to use 20 mg not 40.  We will try to keep her on the 20 mg of Crestor and 10 of Zetia.  Last LDL 100.  Coronary artery calcification - Calcified plaque coronary atherosclerosis seen on CT scan.  Aggressive risk factor modification.  On aspirin 325 for her rheumatoid arthritis as well.  20 mg of Crestor, 10 of Zetia.  He would be nice to see an LDL less than 70 however it may be challenging to reach that goal.  Rheumatoid arthritis - Affecting eyes, lungs.  Prior corneal transplant.  Pulmonary artery pressures could be mildly elevated with pulmonary trunk of 31 mmHg.  Pulmonary pressures were estimated on echocardiogram 05/12/2020 at 40 mmHg.  Mildly increased.  We will be repeating echocardiogram in 1 year.  Moderate mitral regurgitation - We will be repeating echocardiogram in 1 year.  Continue to optimize blood pressure control.   Medication Adjustments/Labs and Tests Ordered: Current medicines are reviewed at length with the patient today.  Concerns regarding medicines are outlined above.  No orders of the defined types were placed in this encounter.  Meds ordered this encounter  Medications  . losartan (COZAAR) 100 MG tablet    Sig: Take 1 tablet (100 mg total) by mouth daily.    Dispense:  90 tablet    Refill:  3  . rosuvastatin (CRESTOR) 20 MG tablet    Sig: Take 1 tablet (20 mg total) by mouth daily.    Dispense:  90 tablet    Refill:  3    Patient Instructions  Medication Instructions:  Please decrease your Crestor to 20 mg a day. Increase your Losartan to 100 mg a day. Continue all other medications as listed.  *If you need a refill on your cardiac medications before your next appointment, please call your pharmacy*  Please  follow up in the Hypertension Clinic in 3 months.  Follow-Up: At [facility], you and your health needs are our priority.  As part of our continuing mission to provide you with exceptional heart care, we have created designated Provider Care Teams.  These Care Teams include your primary Cardiologist (physician) and Advanced Practice Providers (APPs -  Physician Assistants and Nurse Practitioners) who all work together to provide you with the care you need, when you need it.  We recommend signing up for the patient portal called "MyChart".  Sign up information is provided on this After Visit Summary.  MyChart is used to connect with patients for Virtual Visits (Telemedicine).  Patients are able to view lab/test results, encounter notes, upcoming appointments, etc.  Non-urgent messages can be sent to your provider as well.   To learn more about what you can do with MyChart, go to [email or web address].    Your next appointment:   6 month(s)  The format for your next appointment:   In Person  Provider:   Donato Schultz, MD   Thank you for choosing [facility]!!         Signed, Donato Schultz, MD  07/28/2020 9:44 AM    [facility]

## 2020-07-30 DIAGNOSIS — H401133 Primary open-angle glaucoma, bilateral, severe stage: Secondary | ICD-10-CM | POA: Diagnosis not present

## 2020-07-30 DIAGNOSIS — Z947 Corneal transplant status: Secondary | ICD-10-CM | POA: Diagnosis not present

## 2020-07-30 DIAGNOSIS — Z9841 Cataract extraction status, right eye: Secondary | ICD-10-CM | POA: Diagnosis not present

## 2020-07-30 DIAGNOSIS — H401112 Primary open-angle glaucoma, right eye, moderate stage: Secondary | ICD-10-CM | POA: Diagnosis not present

## 2020-07-30 DIAGNOSIS — Z961 Presence of intraocular lens: Secondary | ICD-10-CM | POA: Diagnosis not present

## 2020-07-30 DIAGNOSIS — Z9842 Cataract extraction status, left eye: Secondary | ICD-10-CM | POA: Diagnosis not present

## 2020-07-30 DIAGNOSIS — H401123 Primary open-angle glaucoma, left eye, severe stage: Secondary | ICD-10-CM | POA: Diagnosis not present

## 2020-07-31 ENCOUNTER — Encounter: Payer: Self-pay | Admitting: Rheumatology

## 2020-07-31 ENCOUNTER — Ambulatory Visit: Payer: Medicare PPO | Admitting: Rheumatology

## 2020-07-31 ENCOUNTER — Other Ambulatory Visit: Payer: Self-pay

## 2020-07-31 ENCOUNTER — Telehealth: Payer: Self-pay

## 2020-07-31 VITALS — BP 146/87 | HR 77 | Resp 14 | Ht 62.0 in | Wt 121.0 lb

## 2020-07-31 DIAGNOSIS — Z79899 Other long term (current) drug therapy: Secondary | ICD-10-CM

## 2020-07-31 DIAGNOSIS — J849 Interstitial pulmonary disease, unspecified: Secondary | ICD-10-CM

## 2020-07-31 DIAGNOSIS — Z87898 Personal history of other specified conditions: Secondary | ICD-10-CM

## 2020-07-31 DIAGNOSIS — H209 Unspecified iridocyclitis: Secondary | ICD-10-CM | POA: Diagnosis not present

## 2020-07-31 DIAGNOSIS — Z9889 Other specified postprocedural states: Secondary | ICD-10-CM

## 2020-07-31 DIAGNOSIS — M0579 Rheumatoid arthritis with rheumatoid factor of multiple sites without organ or systems involvement: Secondary | ICD-10-CM

## 2020-07-31 DIAGNOSIS — M810 Age-related osteoporosis without current pathological fracture: Secondary | ICD-10-CM

## 2020-07-31 DIAGNOSIS — M7062 Trochanteric bursitis, left hip: Secondary | ICD-10-CM

## 2020-07-31 DIAGNOSIS — Z8669 Personal history of other diseases of the nervous system and sense organs: Secondary | ICD-10-CM | POA: Diagnosis not present

## 2020-07-31 DIAGNOSIS — M7061 Trochanteric bursitis, right hip: Secondary | ICD-10-CM | POA: Diagnosis not present

## 2020-07-31 DIAGNOSIS — M5136 Other intervertebral disc degeneration, lumbar region: Secondary | ICD-10-CM

## 2020-07-31 NOTE — Telephone Encounter (Signed)
Please apply for forteo per Dr. Corliss Skains, thanks!

## 2020-07-31 NOTE — Patient Instructions (Signed)
Standing Labs We placed an order today for your standing lab work.   Please have your standing labs drawn in March and every 3 months    If possible, please have your labs drawn 2 weeks prior to your appointment so that the provider can discuss your results at your appointment.  We have open lab daily Monday through Thursday from 8:30-12:30 PM and 1:30-4:30 PM and Friday from 8:30-12:30 PM and 1:30-4:00 PM at the office of Dr. Rox Mcgriff, [facility].   Please be advised, patients with office appointments requiring lab work will take precedents over walk-in lab work.  If possible, please come for your lab work on Monday and Friday afternoons, as you may experience shorter wait times. The office is located at [address], [address], [place], [place] [address] No appointment is necessary.   Labs are drawn by [facility]. Please bring your co-pay at the time of your lab draw.  You may receive a bill from [facility] for your lab work.  If you wish to have your labs drawn at another location, please call the office 24 hours in advance to send orders.  If you have any questions regarding directions or hours of operation,  please call [phone].   As a reminder, please drink plenty of water prior to coming for your lab work. Thanks!   

## 2020-07-31 NOTE — Telephone Encounter (Signed)
Submitted a Prior Authorization request to [facility] for FORTEO via Cover My Meds. Will update once we receive a response.  KeyLelon Frohlich - PA Case ID: [id number]

## 2020-08-01 NOTE — Telephone Encounter (Signed)
Received notification from [facility] regarding a prior authorization for FORTEO. Authorization has been APPROVED from 07/19/20 to 07/18/21.   Authorization # [id number]  Ran test claim, patient's copay is $100 for 1 month supply (not counting required pen needles)   Called patient and discussed, she does not have a patient assistance application. Will place 1 in the mail and she will call with any questions.

## 2020-08-05 DIAGNOSIS — G43909 Migraine, unspecified, not intractable, without status migrainosus: Secondary | ICD-10-CM | POA: Diagnosis not present

## 2020-08-05 DIAGNOSIS — Z0000 Encounter for general adult medical examination without abnormal findings: Secondary | ICD-10-CM | POA: Diagnosis not present

## 2020-08-05 DIAGNOSIS — E7800 Pure hypercholesterolemia, unspecified: Secondary | ICD-10-CM | POA: Diagnosis not present

## 2020-08-05 DIAGNOSIS — M069 Rheumatoid arthritis, unspecified: Secondary | ICD-10-CM | POA: Diagnosis not present

## 2020-08-05 DIAGNOSIS — J849 Interstitial pulmonary disease, unspecified: Secondary | ICD-10-CM | POA: Diagnosis not present

## 2020-08-05 DIAGNOSIS — H3530 Unspecified macular degeneration: Secondary | ICD-10-CM | POA: Diagnosis not present

## 2020-08-05 DIAGNOSIS — G40909 Epilepsy, unspecified, not intractable, without status epilepticus: Secondary | ICD-10-CM | POA: Diagnosis not present

## 2020-08-05 DIAGNOSIS — I10 Essential (primary) hypertension: Secondary | ICD-10-CM | POA: Diagnosis not present

## 2020-08-05 DIAGNOSIS — H18609 Keratoconus, unspecified, unspecified eye: Secondary | ICD-10-CM | POA: Diagnosis not present

## 2020-08-07 ENCOUNTER — Ambulatory Visit: Payer: Medicare PPO | Admitting: Neurology

## 2020-08-07 ENCOUNTER — Telehealth: Payer: Self-pay | Admitting: Neurology

## 2020-08-07 ENCOUNTER — Encounter: Payer: Self-pay | Admitting: Neurology

## 2020-08-07 VITALS — BP 158/82 | HR 92 | Ht 62.0 in | Wt 121.0 lb

## 2020-08-07 DIAGNOSIS — G40909 Epilepsy, unspecified, not intractable, without status epilepticus: Secondary | ICD-10-CM

## 2020-08-07 DIAGNOSIS — G43719 Chronic migraine without aura, intractable, without status migrainosus: Secondary | ICD-10-CM

## 2020-08-07 MED ORDER — REYVOW 100 MG PO TABS: 100.0000 mg | ORAL_TABLET | Freq: Every day | ORAL | 0 refills | Status: DC | PRN

## 2020-08-07 MED ORDER — NURTEC 75 MG PO TBDP: 75.0000 mg | ORAL_TABLET | Freq: Every day | ORAL | 0 refills | Status: DC | PRN

## 2020-08-07 MED ORDER — UBRELVY 50 MG PO TABS: 50.0000 mg | ORAL_TABLET | ORAL | 0 refills | Status: DC | PRN

## 2020-08-07 NOTE — Telephone Encounter (Signed)
Faxed signed PA request to [facility] with notes.

## 2020-08-07 NOTE — Patient Instructions (Signed)
Bernita Raisin: Please take one tablet at the onset of your headache. If it does not improve the symptoms please take one additional tablet. Do not take more then 2 tablets in 24hrs. Do not take use more then 2 to 3 times in a week.  Nurtec: Once daily as needed  Lasmiditan: Once daily as needed.  Lasmiditan tablets What is this medicine? LASMIDITAN (las MID i tan) is used to treat migraines with or without aura. An aura is a strange feeling or visual disturbance that warns you of an attack. It is not used to prevent migraines. This medicine may be used for other purposes; ask your health care provider or pharmacist if you have questions. COMMON BRAND NAME(S): REYVOW What should I tell my health care provider before I take this medicine? They need to know if you have any of these conditions:  heart disease  liver disease  an unusual or allergic reaction to lasmiditan, other medicines, foods, dyes, or preservatives  pregnant or trying to get pregnant  breast-feeding How should I use this medicine? Take this medicine by mouth with water. Take it as directed on the label. Do not cut, crush or chew this medicine. Swallow the tablets whole. You can take it with or without food. If it upsets your stomach, take it with food. Do not use it more often than directed. A special MedGuide will be given to you by the pharmacist with each prescription and refill. Be sure to read this information carefully each time. Talk to your health care provider about the use of this medicine in children. Special care may be needed. Overdosage: If you think you have taken too much of this medicine contact a poison control center or emergency room at once. NOTE: This medicine is only for you. Do not share this medicine with others. What if I miss a dose? This does not apply. This medicine is not for regular use. It should only be used as needed. What may interact with this medicine? This medicine may interact with the  following medications:  alcohol  antihistamines for allergy, cough, and cold  certain medicines for anxiety or sleep  certain medicines for blood pressure, heart disease, irregular heart beat  certain medicines for depression, anxiety, or psychotic disorders  certain medicines for seizures like phenobarbital, primidone  dextromethorphan  general anesthetics like halothane, isoflurane, methoxyflurane, propofol  local anesthetics like lidocaine, pramoxine, tetracaine  medicines that relax muscles for surgery  narcotic medicines for pain  phenothiazines like chlorpromazine, mesoridazine, prochlorperazine, thioridazine  St. John's wort This list may not describe all possible interactions. Give your health care provider a list of all the medicines, herbs, non-prescription drugs, or dietary supplements you use. Also tell them if you smoke, drink alcohol, or use illegal drugs. Some items may interact with your medicine. What should I watch for while using this medicine? Visit your health care provider for regular checks on your progress. Tell your health care provider if your symptoms do not start to get better or if they get worse. You may get drowsy or dizzy. Do not drive, use machinery, or do anything that needs mental alertness until you know how this medicine affects you. Do not stand up or sit up quickly, especially if you are an older patient. This reduces the risk of dizzy or fainting spells. Alcohol may interfere with the effect of this medicine. Avoid alcoholic drinks. Tell your health care provider right away if you have any change in your eyesight. What side  effects may I notice from receiving this medicine? Side effects that you should report to your doctor or health care professional as soon as possible:  allergic reactions (skin rash, itching or hives; swelling of the face, lips, or tongue)  changes in vision  heartbeat rhythm changes (trouble breathing; chest pain;  dizziness; fast, irregular heartbeat; feeling faint or lightheaded, falls) Side effects that usually do not require medical attention (report these to your doctor or health care professional if they continue or are bothersome):  dizziness  drowsiness  headache  nausea, vomiting  palpitations  tiredness This list may not describe all possible side effects. Call your doctor for medical advice about side effects. You may report side effects to FDA at 1-800-FDA-1088. Where should I keep my medicine? Keep out of the reach of children and pets. This medicine can be abused. Keep it in a safe place to protect it from theft. Do not share it with anyone. It is only for you. Selling or giving away this medicine is dangerous and against the law. Store at room temperature between 20 and 25 degrees C (68 and 77 degrees F). Get rid of any unused medicine after the expiration date. This medicine may cause harm and death if it is taken by other adults, children, or pets. It is important to get rid of the medicine as soon as you no longer need it or it is expired. You can do this in two ways:  Take the medicine to a medicine take-back program. Check with your pharmacy or law enforcement to find a location.  If you cannot return the medicine, check the label or package insert to see if the medicine should be thrown out in the garbage or flushed down the toilet. If you are not sure, ask your health care provider. If it is safe to put it in the trash, take the medicine out of the container. Mix the medicine with cat litter, dirt, coffee grounds, or other unwanted substance. Seal the mixture in a bag or container. Put it in the trash. NOTE: This sheet is a summary. It may not cover all possible information. If you have questions about this medicine, talk to your doctor, pharmacist, or health care provider.  2021 Elsevier/Gold Standard (2020-01-02 12:03:02) Rimegepant oral dissolving tablet What is this  medicine? RIMEGEPANT (ri ME je pant) is used to treat migraine headaches with or without aura. An aura is a strange feeling or visual disturbance that warns you of an attack. It is also used to prevent migraine headaches. This medicine may be used for other purposes; ask your health care provider or pharmacist if you have questions. COMMON BRAND NAME(S): NURTEC ODT What should I tell my health care provider before I take this medicine? They need to know if you have any of these conditions:  kidney disease  liver disease  an unusual or allergic reaction to rimegepant, other medicines, foods, dyes, or preservatives  pregnant or trying to get pregnant  breast-feeding How should I use this medicine? Take the medicine by mouth. Follow the directions on the prescription label. Leave the tablet in the sealed blister pack until you are ready to take it. With dry hands, open the blister and gently remove the tablet. If the tablet breaks or crumbles, throw it away and take a new tablet out of the blister pack. Place the tablet in the mouth and allow it to dissolve, and then swallow. Do not cut, crush, or chew this medicine. You do not  need water to take this medicine. Talk to your pediatrician about the use of this medicine in children. Special care may be needed. Overdosage: If you think you have taken too much of this medicine contact a poison control center or emergency room at once. NOTE: This medicine is only for you. Do not share this medicine with others. What if I miss a dose? This does not apply. This medicine is not for regular use. What may interact with this medicine? This medicine may interact with the following medications:  certain medicines for fungal infections like fluconazole, itraconazole  rifampin This list may not describe all possible interactions. Give your health care provider a list of all the medicines, herbs, non-prescription drugs, or dietary supplements you use. Also  tell them if you smoke, drink alcohol, or use illegal drugs. Some items may interact with your medicine. What should I watch for while using this medicine? Visit your health care professional for regular checks on your progress. Tell your health care professional if your symptoms do not start to get better or if they get worse. What side effects may I notice from receiving this medicine? Side effects that you should report to your doctor or health care professional as soon as possible:  allergic reactions like skin rash, itching or hives; swelling of the face, lips, or tongue Side effects that usually do not require medical attention (report these to your doctor or health care professional if they continue or are bothersome):  nausea This list may not describe all possible side effects. Call your doctor for medical advice about side effects. You may report side effects to FDA at 1-800-FDA-1088. Where should I keep my medicine? Keep out of the reach of children and pets. Store at room temperature between 20 and 25 degrees C (68 and 77 degrees F). Get rid of any unused medicine after the expiration date. To get rid of medicines that are no longer needed or have expired:  Take the medicine to a medicine take-back program. Check with your pharmacy or law enforcement to find a location.  If you cannot return the medicine, check the label or package insert to see if the medicine should be thrown out in the garbage or flushed down the toilet. If you are not sure, ask your health care provider. If it is safe to put it in the trash, take the medicine out of the container. Mix the medicine with cat litter, dirt, coffee grounds, or other unwanted substance. Seal the mixture in a bag or container. Put it in the trash. NOTE: This sheet is a summary. It may not cover all possible information. If you have questions about this medicine, talk to your doctor, pharmacist, or health care provider.  2021  Elsevier/Gold Standard (2019-12-18 17:56:55) Ubrogepant tablets What is this medicine? UBROGEPANT (ue BROE je pant) is used to treat migraine headaches with or without aura. An aura is a strange feeling or visual disturbance that warns you of an attack. It is not used to prevent migraines. This medicine may be used for other purposes; ask your health care provider or pharmacist if you have questions. COMMON BRAND NAME(S): Bernita Raisin What should I tell my health care provider before I take this medicine? They need to know if you have any of these conditions:  kidney disease  liver disease  an unusual or allergic reaction to ubrogepant, other medicines, foods, dyes, or preservatives  pregnant or trying to get pregnant  breast-feeding How should I use this medicine?  Take this medicine by mouth with a glass of water. Follow the directions on the prescription label. You can take it with or without food. If it upsets your stomach, take it with food. Take your medicine at regular intervals. Do not take it more often than directed. Do not stop taking except on your doctor's advice. Talk to your pediatrician about the use of this medicine in children. Special care may be needed. Overdosage: If you think you have taken too much of this medicine contact a poison control center or emergency room at once. NOTE: This medicine is only for you. Do not share this medicine with others. What if I miss a dose? This does not apply. This medicine is not for regular use. What may interact with this medicine? Do not take this medicine with any of the following medicines:  ceritinib  certain antibiotics like chloramphenicol, clarithromycin, telithromycin  certain antivirals for HIV like atazanavir, cobicistat, darunavir, delavirdine, fosamprenavir, indinavir, ritonavir  certain medicines for fungal infections like itraconazole, ketoconazole, posaconazole,  voriconazole  conivaptan  grapefruit  idelalisib  mifepristone  nefazodone  ribociclib This medicine may also interact with the following medications:  carvedilol  certain medicines for seizures like phenobarbital, phenytoin  ciprofloxacin  cyclosporine  eltrombopag  fluconazole  fluvoxamine  quinidine  rifampin  St. John's wort  verapamil This list may not describe all possible interactions. Give your health care provider a list of all the medicines, herbs, non-prescription drugs, or dietary supplements you use. Also tell them if you smoke, drink alcohol, or use illegal drugs. Some items may interact with your medicine. What should I watch for while using this medicine? Visit your health care professional for regular checks on your progress. Tell your health care professional if your symptoms do not start to get better or if they get worse. Your mouth may get dry. Chewing sugarless gum or sucking hard candy and drinking plenty of water may help. Contact your health care professional if the problem does not go away or is severe. What side effects may I notice from receiving this medicine? Side effects that you should report to your doctor or health care professional as soon as possible:  allergic reactions like skin rash, itching or hives; swelling of the face, lips, or tongue Side effects that usually do not require medical attention (report these to your doctor or health care professional if they continue or are bothersome):  drowsiness  dry mouth  nausea  tiredness This list may not describe all possible side effects. Call your doctor for medical advice about side effects. You may report side effects to FDA at 1-800-FDA-1088. Where should I keep my medicine? Keep out of the reach of children. Store at room temperature between 15 and 30 degrees C (59 and 86 degrees F). Throw away any unused medicine after the expiration date. NOTE: This sheet is a summary. It  may not cover all possible information. If you have questions about this medicine, talk to your doctor, pharmacist, or health care provider.  2021 Elsevier/Gold Standard (2018-09-21 08:50:55)

## 2020-08-07 NOTE — Telephone Encounter (Signed)
Patient has a Botox appointment on 2/14. I filled out [facility] PA form for Botox continuation. Previous PA #[id number] expired 06/30/20. Will give to MD to sign.

## 2020-08-07 NOTE — Progress Notes (Signed)
[facility]    Provider:  Dr Lucia Gaskins Referring Provider: Irena Reichmann, DO Primary Care Physician:  Irena Reichmann, DO  CC:  Chronic intractable headaches, seizures  She is here to discuss acute management. She cannot take imitrex due to hx of stroke. She has the migraines twice a week which is much better that prior since being on the botox and Ajovy.   HPI:  Brianna [name] is a 70 y.o. female here as a referral from Dr. Thomasena Edis for Patient with chronic intractable headaches. She has had good response to botox (>50% reduction) but still technically has chronic migraines. Ajovy has helped significantly. New problem, she had a seizure at the age of 28 because she missed her medication. 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 In college she would feel strange and have strange time awareness before the seizures. She was started in dilantin. She saw a specialist and tried other medications but Dilantinis the only one that helped. She gets regular bone density tests, last seizure was 30 years ago. Been on Dilantin since the age of 22. She is not interested in changing.   Review of Systems: Patient complains of symptoms per HPI as well as the following symptoms: seizures, eye pain. Pertinent negatives and positives per HPI. All others negative    Social History   Socioeconomic History  . Marital status: Married    Spouse name: [name]  . Number of children: 2  . Years of education: Bachelor  . Highest education level: Not on file  Occupational History  . Occupation: Runner, broadcasting/film/video  . Occupation: [employer] TEACHER    Employer: [employer]  Tobacco Use  . Smoking status: Former Smoker    Packs/day: 0.50    Years: 14.00    Pack years: 7.00    Types: Cigarettes    Quit date: 07/19/1988    Years since quitting: 32.0  . Smokeless  tobacco: Never Used  Vaping Use  . Vaping Use: Never used  Substance and Sexual Activity  . Alcohol use: No  . Drug use: No  . Sexual activity: Not on file  Other Topics Concern  . Not on file  Social History Narrative   Patient is married to [name]), has 2 children   Patient is right handed   Education level is Bachelor's degree   Caffeine consumption is about 2 cups daily   Lives at home with husband    Social Determinants of Health   Financial Resource Strain: Not on file  Food Insecurity: Not on file  Transportation Needs: Not on file  Physical Activity: Not on file  Stress: Not on file  Social Connections: Not on file  Intimate Partner Violence: Not on file    Family History  Problem Relation Age of Onset  . Heart disease Mother   . Lung disease Mother        ? disease process  . Uterine cancer Mother   . Heart disease Father   . Clotting disorder Father   . Collagen disease Father   . Cervical cancer Maternal Aunt   . Prostate cancer Maternal Grandfather   . Rheum arthritis Sister   . High Cholesterol Sister   . Epilepsy Sister   . Rheum arthritis Maternal Grandmother   . High Cholesterol Brother   . High blood pressure Brother   .  High blood pressure Brother   . High Cholesterol Brother   . High blood pressure Brother   . High Cholesterol Brother     Past Medical History:  Diagnosis Date  . Arthritis    in the neck  . Carotid artery occlusion   . Epilepsy (HCC)   . GERD (gastroesophageal reflux disease)   . Glaucoma   . Hyperlipidemia   . Hypertension   . Keratoconus of both eyes 1981  . Macular degeneration   . Migraines   . Neuromuscular disorder (HCC)    RA  . Peripheral vascular disease (HCC)    carotid occlusion surgery on left  . Raynaud's disease   . Retinal edema   . Rheumatoid arthritis(714.0)   . Stroke [facility])    two mild strokes presumed from left carotid stenosis    Past Surgical History:  Procedure Laterality Date  .  CAROTID ARTERY ANGIOPLASTY  2008   Dr Madilyn Fireman  . CAROTID ENDARTERECTOMY Left 11-15-2007  . cataract extraction Right 04-2014   [facility]  . CHOLECYSTECTOMY  1995  . CORNEAL TRANSPLANT     x 5 ; steroid inj. retnal information  . CORNEAL TRANSPLANT Right 01-22-2014   [facility]  . 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 EYE SURGERY Right 12/2016   cornea repair   . EYE SURGERY Right 09/20/2017   cornea transplant   . GLAUCOMA SURGERY Right 2018  . TRIGGER FINGER RELEASE Right 05/16/2018   Procedure: RELEASE TRIGGER FINGER/A-1 PULLEY RIGHT THUMB;  Surgeon: Cindee Salt, MD;  Location: [facility];  Service: Orthopedics;  Laterality: Right;    Current Outpatient Medications  Medication Sig Dispense Refill  . ACETAMINOPHEN-BUTALBITAL 50-325 MG TABS Take by mouth daily as needed.    Marland Kitchen amLODipine (NORVASC) 10 MG tablet Take 10 mg by mouth daily.    . Ascorbic Acid 500 MG CAPS Take by mouth.    Marland Kitchen aspirin 325 MG tablet Take 325 mg by mouth daily.    . botulinum toxin Type A (BOTOX) 100 units SOLR injection Inject IM into the head and neck muscles every 3 months by provider in office 2 vial 3  . butalbital-acetaminophen-caffeine (FIORICET, ESGIC) 50-325-40 MG tablet Take 1 tablet by mouth every 6 (six) hours as needed for headache. 10 tablet 4  . Calcium Carbonate-Vit D-Min (CALCIUM 1200 PO) Take 2 capsules by mouth daily.    . chlorthalidone (HYGROTON) 25 MG tablet Take 1 tablet (25 mg total) by mouth daily. 30 tablet 11  . Cholecalciferol (VITAMIN D PO) Take 1,000 Units by mouth daily.     . cloNIDine (CATAPRES) 0.1 MG tablet Take 0.1 mg by mouth daily.    . Difluprednate (DUREZOL) 0.05 % EMUL 6 drops in left eye and 2 in right eye daily.    Marland Kitchen DILANTIN 100 MG ER capsule TAKE 3 CAPSULES BY MOUTH DAILY 270 capsule 3  . ezetimibe (ZETIA) 10 MG tablet Take 10 mg by mouth daily.    . famotidine (PEPCID) 20 MG tablet TAKE 1 TABLET(20 MG) BY MOUTH AT BEDTIME 90 tablet 1  . HUMIRA  PEN 40 MG/0.4ML PNKT Inject 40 mg into the skin every 14 (fourteen) days. As directed    . hypromellose (GENTEAL) 0.3 % GEL ophthalmic ointment as needed.    Marylin Crosby Succinate (REYVOW) 100 MG TABS Take 100 mg by mouth daily as needed. 1 tablet 0  . leflunomide (ARAVA) 20 MG tablet Take 20 mg by mouth daily.    Marland Kitchen  LORazepam (ATIVAN) 0.5 MG tablet Take 0.5 mg by mouth daily as needed for anxiety or sleep.    Marland Kitchen losartan (COZAAR) 100 MG tablet Take 1 tablet (100 mg total) by mouth daily. (Patient taking differently: Take 200 mg by mouth daily.) 90 tablet 3  . pantoprazole (PROTONIX) 40 MG tablet TAKE 1 TABLET(40 MG) BY MOUTH DAILY 90 tablet 0  . promethazine (PHENERGAN) 25 MG tablet Take 1 tablet (25 mg total) by mouth every 6 (six) hours as needed for nausea or vomiting. 30 tablet 11  . RESTASIS 0.05 % ophthalmic emulsion 2 (two) times daily.    . Rimegepant Sulfate (NURTEC) 75 MG TBDP Take 75 mg by mouth daily as needed. For migraines. Take as close to onset of migraine as possible. One daily maximum. 4 tablet 0  . rosuvastatin (CRESTOR) 20 MG tablet Take 1 tablet (20 mg total) by mouth daily. 90 tablet 3  . SUMAtriptan (IMITREX) 6 MG/0.5ML SOLN injection Inject 0.5 mLs (6 mg total) into the skin as needed for migraine. 4 mL 11  . tacrolimus (PROGRAF) 1 MG capsule Take 1 mg by mouth 2 (two) times daily.    Marland Kitchen Ubrogepant (UBRELVY) 50 MG TABS Take 50 mg by mouth every 2 (two) hours as needed. Max 200mg  a day 4 tablet 0   Current Facility-Administered Medications  Medication Dose Route Frequency Provider Last Rate Last Admin  . pneumococcal 23 valent vaccine (PNU-IMMUNE) injection 0.5 mL  0.5 mL Intramuscular Once , MD        Allergies as of 08/07/2020 - Review Complete 08/07/2020  Allergen Reaction Noted  . Morphine Other (See Comments) 06/19/2015  . Morphine and related Shortness Of Breath 01/14/2011  . Alphagan [brimonidine]  12/27/2017  . Codeine Nausea And Vomiting  06/19/2015  . Dorzolamide hcl-timolol mal  12/27/2017  . Hydrocodone-acetaminophen Nausea And Vomiting 06/19/2015  . Sulfa antibiotics Rash 01/14/2011  . Sulfamethoxazole Rash 06/19/2015    Vitals: BP (!) 158/82 (BP Location: Right Arm, Patient Position: Sitting)   Pulse 92   Ht 5\' 2"  (1.575 m)   Wt 121 lb (54.9 kg)   BMI 22.13 kg/m  Last Weight:  Wt Readings from Last 1 Encounters:  08/07/20 121 lb (54.9 kg)   Last Height:   Ht Readings from Last 1 Encounters:  08/07/20 5\' 2"  (1.575 m)   Exam: NAD, pleasant                  Speech:    Speech is normal; fluent and spontaneous with normal comprehension.  Cognition:    The patient is oriented to person, place, and time;     recent and remote memory intact;     language fluent;    Cranial Nerves:    The pupils are equal, round, and reactive to light.Trigeminal sensation is intact and the muscles of mastication are normal. The face is symmetric. The palate elevates in the midline. Hearing intact. Voice is normal. Shoulder shrug is normal. The tongue has normal motion without fasciculations.   Coordination:  No dysmetria  Motor Observation:    No asymmetry, no atrophy, and no involuntary movements noted. Tone:    Normal muscle tone.     Strength:    Strength is V/V in the upper and lower limbs.      Sensation: intact to LT       Assessment/Plan:  70  year old with hx of seizures. Has been on dilantin long term. Tried other AEDs, Discussed  long-term side effects of Dilantin, she would like to stay on this medication. Will continue. Also cannot take imitrex due to strokes, will give her several other acute management medications to try on different days. She also has fioricet but discussed rebound headache potential.   Triptans contraindicated in this patient due to strokes. Will try several other options gave samples. Discussed in detail: Ubrelvy: taking with dilantin may increase ubrelvy action, but will keep her at  50mg  instead of 100mg  and only as needed no more than 2 days a week Lasmiditan and Dilantin : May increase sedation, discussed with patient, again only as needed up to 2 days a week Nurtec: Once daily as needed up to 2 days a week Discussed Nerivio - seizure precautions - Discussed Patients with epilepsy have a small risk of sudden unexpected death, a condition referred to as sudden unexpected death in epilepsy (SUDEP). SUDEP is defined specifically as the sudden, unexpected, witnessed or unwitnessed, nontraumatic and nondrowning death in patients with epilepsy with or without evidence for a seizure, and excluding documented status epilepticus, in which post mortem examination does not reveal a structural or toxicologic cause for death  - Patient is unable to drive, operate heavy machinery, perform activities at heights or participate in water activities until 6 months seizure free. She is well controlled without a seizure for many years.   Meds ordered this encounter  Medications  . Ubrogepant (UBRELVY) 50 MG TABS    Sig: Take 50 mg by mouth every 2 (two) hours as needed. Max 200mg  a day    Dispense:  4 tablet    Refill:  0    4/22  . Lasmiditan Succinate (REYVOW) 100 MG TABS    Sig: Take 100 mg by mouth daily as needed.    Dispense:  1 tablet    Refill:  0  . Rimegepant Sulfate (NURTEC) 75 MG TBDP    Sig: Take 75 mg by mouth daily as needed. For migraines. Take as close to onset of migraine as possible. One daily maximum.    Dispense:  4 tablet    Refill:  0     , MD  [facility] [address] [place], [facility] [address]  Phone [phone] Fax [phone]  I spent over 40  minutes of face-to-face and non-face-to-face time with patient on the  1. Intractable chronic migraine without aura and without status migrainosus   2. Nonintractable epilepsy without status epilepticus, unspecified epilepsy type (HCC)    diagnosis.   This included previsit chart review, lab review, study review, order entry, electronic health record documentation, patient education on the different diagnostic and therapeutic options, counseling and coordination of care, risks and benefits of management, compliance, or risk factor reduction

## 2020-08-11 NOTE — Telephone Encounter (Signed)
Received approval from [facility] via fax. EOC #[id number] (08/02/19- 07/18/21).

## 2020-08-12 DIAGNOSIS — Z79899 Other long term (current) drug therapy: Secondary | ICD-10-CM | POA: Diagnosis not present

## 2020-08-12 DIAGNOSIS — H30102 Unspecified disseminated chorioretinal inflammation, left eye: Secondary | ICD-10-CM | POA: Diagnosis not present

## 2020-08-12 DIAGNOSIS — Z947 Corneal transplant status: Secondary | ICD-10-CM | POA: Diagnosis not present

## 2020-08-12 DIAGNOSIS — H3581 Retinal edema: Secondary | ICD-10-CM | POA: Diagnosis not present

## 2020-08-12 DIAGNOSIS — H401112 Primary open-angle glaucoma, right eye, moderate stage: Secondary | ICD-10-CM | POA: Diagnosis not present

## 2020-08-12 DIAGNOSIS — H30033 Focal chorioretinal inflammation, peripheral, bilateral: Secondary | ICD-10-CM | POA: Diagnosis not present

## 2020-08-12 DIAGNOSIS — H401123 Primary open-angle glaucoma, left eye, severe stage: Secondary | ICD-10-CM | POA: Diagnosis not present

## 2020-08-12 DIAGNOSIS — H353221 Exudative age-related macular degeneration, left eye, with active choroidal neovascularization: Secondary | ICD-10-CM | POA: Diagnosis not present

## 2020-08-22 ENCOUNTER — Other Ambulatory Visit: Payer: Self-pay | Admitting: Internal Medicine

## 2020-08-22 DIAGNOSIS — H30102 Unspecified disseminated chorioretinal inflammation, left eye: Secondary | ICD-10-CM | POA: Diagnosis not present

## 2020-08-22 DIAGNOSIS — R059 Cough, unspecified: Secondary | ICD-10-CM

## 2020-08-22 DIAGNOSIS — H401112 Primary open-angle glaucoma, right eye, moderate stage: Secondary | ICD-10-CM | POA: Diagnosis not present

## 2020-08-22 DIAGNOSIS — H401123 Primary open-angle glaucoma, left eye, severe stage: Secondary | ICD-10-CM | POA: Diagnosis not present

## 2020-08-22 DIAGNOSIS — H1820 Unspecified corneal edema: Secondary | ICD-10-CM | POA: Diagnosis not present

## 2020-08-28 ENCOUNTER — Ambulatory Visit: Payer: Medicare PPO | Admitting: Physician Assistant

## 2020-08-28 ENCOUNTER — Ambulatory Visit (HOSPITAL_COMMUNITY)
Admission: RE | Admit: 2020-08-28 | Discharge: 2020-08-28 | Disposition: A | Discharge status: Home / Self Care | Payer: Medicare PPO | Source: Ambulatory Visit | Attending: Physician Assistant | Admitting: Physician Assistant

## 2020-08-28 ENCOUNTER — Encounter: Payer: Self-pay | Admitting: Physician Assistant

## 2020-08-28 ENCOUNTER — Other Ambulatory Visit: Payer: Self-pay

## 2020-08-28 VITALS — BP 139/89 | HR 75 | Temp 98.7°F | Resp 20 | Ht 62.0 in | Wt 119.6 lb

## 2020-08-28 DIAGNOSIS — H3530 Unspecified macular degeneration: Secondary | ICD-10-CM | POA: Insufficient documentation

## 2020-08-28 DIAGNOSIS — I6522 Occlusion and stenosis of left carotid artery: Secondary | ICD-10-CM

## 2020-08-28 DIAGNOSIS — E7800 Pure hypercholesterolemia, unspecified: Secondary | ICD-10-CM | POA: Insufficient documentation

## 2020-08-28 DIAGNOSIS — I6990 Unspecified sequelae of unspecified cerebrovascular disease: Secondary | ICD-10-CM | POA: Insufficient documentation

## 2020-08-28 DIAGNOSIS — F419 Anxiety disorder, unspecified: Secondary | ICD-10-CM | POA: Insufficient documentation

## 2020-08-28 DIAGNOSIS — J8409 Other alveolar and parieto-alveolar conditions: Secondary | ICD-10-CM | POA: Insufficient documentation

## 2020-08-28 DIAGNOSIS — E559 Vitamin D deficiency, unspecified: Secondary | ICD-10-CM | POA: Insufficient documentation

## 2020-08-28 DIAGNOSIS — Z79899 Other long term (current) drug therapy: Secondary | ICD-10-CM | POA: Insufficient documentation

## 2020-08-28 DIAGNOSIS — I779 Disorder of arteries and arterioles, unspecified: Secondary | ICD-10-CM | POA: Insufficient documentation

## 2020-08-28 NOTE — Progress Notes (Signed)
History of Present Illness:  Patient is a 70 y.o. year old female who presents for evaluation of carotid stenosis.  Symptoms related to this stenosis include TIA. sx of expressive aphasia, right hemiparesis, HA and transient left monocular vision loss.  S/P left CEA 2009.  The patient denies symptoms of TIA, amaurosis, or stroke today  The patient is currently on 325 [address]a  antiplatelet therapy.    She has had left eye surgery and has a dealated pupil which is being followed by her opthalmoligist.      Past Medical History:  Diagnosis Date  . Arthritis    in the neck  . Carotid artery occlusion   . Epilepsy (HCC)   . GERD (gastroesophageal reflux disease)   . Glaucoma   . Hyperlipidemia   . Hypertension   . Keratoconus of both eyes 1981  . Macular degeneration   . Migraines   . Neuromuscular disorder (HCC)    RA  . Peripheral vascular disease (HCC)    carotid occlusion surgery on left  . Raynaud's disease   . Retinal edema   . Rheumatoid arthritis(714.0)   . Stroke [facility])    two mild strokes presumed from left carotid stenosis    Past Surgical History:  Procedure Laterality Date  . CAROTID ARTERY ANGIOPLASTY  2008   Dr Madilyn Fireman  . CAROTID ENDARTERECTOMY Left 11-15-2007  . cataract extraction Right 04-2014   [facility]  . CHOLECYSTECTOMY  1995  . CORNEAL TRANSPLANT     x 5 ; steroid inj. retnal information  . CORNEAL TRANSPLANT Right 01-22-2014   [facility]  . 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 EYE SURGERY Right 12/2016   cornea repair   . EYE SURGERY Right 09/20/2017   cornea transplant   . GLAUCOMA SURGERY Right 2018  . TRIGGER FINGER RELEASE Right 05/16/2018   Procedure: RELEASE TRIGGER FINGER/A-1 PULLEY RIGHT THUMB;  Surgeon: Cindee Salt, MD;  Location: [facility];  Service: Orthopedics;  Laterality: Right;     Social History Social History   Tobacco Use  . Smoking status: Former Smoker    Packs/day: 0.50    Years: 14.00    Pack  years: 7.00    Types: Cigarettes    Quit date: 07/19/1988    Years since quitting: 32.1  . Smokeless tobacco: Never Used  Vaping Use  . Vaping Use: Never used  Substance Use Topics  . Alcohol use: No  . Drug use: No    Family History Family History  Problem Relation Age of Onset  . Heart disease Mother   . Lung disease Mother        ? disease process  . Uterine cancer Mother   . Heart disease Father   . Clotting disorder Father   . Collagen disease Father   . Cervical cancer Maternal Aunt   . Prostate cancer Maternal Grandfather   . Rheum arthritis Sister   . High Cholesterol Sister   . Epilepsy Sister   . Rheum arthritis Maternal Grandmother   . High Cholesterol Brother   . High blood pressure Brother   . High blood pressure Brother   . High Cholesterol Brother   . High blood pressure Brother   . High Cholesterol Brother     Allergies  Allergies  Allergen Reactions  . Morphine Other (See Comments)    Difficulty breathing  . Morphine And Related Shortness Of Breath  . Alphagan [Brimonidine]     Eyelid  swelling, scratching Allergic to preservative contained in this med  . Codeine Nausea And Vomiting  . Dorzolamide Hcl-Timolol Mal     Inflammation of the eyelid, scratching Allergic to preservative contained in this med  . Hydrocodone-Acetaminophen Nausea And Vomiting    But tolerates tylenol  . Sulfa Antibiotics Rash    "Large bumps"  . Sulfamethoxazole Rash     Current Outpatient Medications  Medication Sig Dispense Refill  . ACETAMINOPHEN-BUTALBITAL 50-325 MG TABS Take by mouth daily as needed.    Marland Kitchen amLODipine (NORVASC) 10 MG tablet Take 10 mg by mouth daily.    . Ascorbic Acid 500 MG CAPS Take by mouth.    Marland Kitchen aspirin 325 MG tablet Take 325 mg by mouth daily.    . botulinum toxin Type A (BOTOX) 100 units SOLR injection Inject IM into the head and neck muscles every 3 months by provider in office 2 vial 3  . butalbital-acetaminophen-caffeine (FIORICET,  ESGIC) 50-325-40 MG tablet Take 1 tablet by mouth every 6 (six) hours as needed for headache. 10 tablet 4  . Calcium Carbonate-Vit D-Min (CALCIUM 1200 PO) Take 2 capsules by mouth daily.    . chlorthalidone (HYGROTON) 25 MG tablet Take 1 tablet (25 mg total) by mouth daily. 30 tablet 11  . Cholecalciferol (VITAMIN D PO) Take 1,000 Units by mouth daily.     . Difluprednate (DUREZOL) 0.05 % EMUL 6 drops in left eye and 2 in right eye daily.    Marland Kitchen DILANTIN 100 MG ER capsule TAKE 3 CAPSULES BY MOUTH DAILY 270 capsule 3  . ezetimibe (ZETIA) 10 MG tablet Take 10 mg by mouth daily.    . famotidine (PEPCID) 20 MG tablet TAKE 1 TABLET(20 MG) BY MOUTH AT BEDTIME 90 tablet 1  . HUMIRA PEN 40 MG/0.4ML PNKT Inject 40 mg into the skin every 14 (fourteen) days. As directed    . hypromellose (GENTEAL) 0.3 % GEL ophthalmic ointment as needed.    Marylin Crosby Succinate (REYVOW) 100 MG TABS Take 100 mg by mouth daily as needed. 1 tablet 0  . leflunomide (ARAVA) 20 MG tablet Take 20 mg by mouth daily.    Marland Kitchen LORazepam (ATIVAN) 0.5 MG tablet Take 0.5 mg by mouth daily as needed for anxiety or sleep.    Marland Kitchen losartan (COZAAR) 100 MG tablet Take 1 tablet (100 mg total) by mouth daily. (Patient taking differently: Take 200 mg by mouth daily.) 90 tablet 3  . pantoprazole (PROTONIX) 40 MG tablet TAKE 1 TABLET(40 MG) BY MOUTH DAILY 90 tablet 0  . promethazine (PHENERGAN) 25 MG tablet Take 1 tablet (25 mg total) by mouth every 6 (six) hours as needed for nausea or vomiting. 30 tablet 11  . RESTASIS 0.05 % ophthalmic emulsion 2 (two) times daily.    . Rimegepant Sulfate (NURTEC) 75 MG TBDP Take 75 mg by mouth daily as needed. For migraines. Take as close to onset of migraine as possible. One daily maximum. 4 tablet 0  . rosuvastatin (CRESTOR) 10 MG tablet 1 tablet    . tacrolimus (PROGRAF) 1 MG capsule Take 1 mg by mouth 2 (two) times daily.    Marland Kitchen Ubrogepant (UBRELVY) 50 MG TABS Take 50 mg by mouth every 2 (two) hours as needed.  Max 200mg  a day 4 tablet 0   Current Facility-Administered Medications  Medication Dose Route Frequency Provider Last Rate Last Admin  . pneumococcal 23 valent vaccine (PNU-IMMUNE) injection 0.5 mL  0.5 mL Intramuscular Once Nyoka Cowden, MD  ROS:   General:  No weight loss, Fever, chills  HEENT: No recent headaches, no nasal bleeding, no visual changes, no sore throat  Neurologic: No dizziness, blackouts, seizures. No recent symptoms of stroke or mini- stroke. No recent episodes of slurred speech, or temporary blindness.  Cardiac: No recent episodes of chest pain/pressure, no shortness of breath at rest.  No shortness of breath with exertion.  Denies history of atrial fibrillation or irregular heartbeat  Vascular: No history of rest pain in feet.  No history of claudication.  No history of non-healing ulcer, No history of DVT   Pulmonary: No home oxygen, no productive cough, no hemoptysis,  No asthma or wheezing  Musculoskeletal:  [ ]  Arthritis, [ ]  Low back pain,  [ ]  Joint pain  Hematologic:No history of hypercoagulable state.  No history of easy bleeding.  No history of anemia  Gastrointestinal: No hematochezia or melena,  No gastroesophageal reflux, no trouble swallowing  Urinary: [ ]  chronic Kidney disease, [ ]  on HD - [ ]  MWF or [ ]  TTHS, [ ]  Burning with urination, [ ]  Frequent urination, [ ]  Difficulty urinating;   Skin: No rashes  Psychological: No history of anxiety,  No history of depression   Physical Examination  Vitals:   08/28/20 1029 08/28/20 1031  BP: 134/83 139/89  Pulse: 75   Resp: 20   Temp: 98.7 F (37.1 C)   TempSrc: Temporal   SpO2: 96%   Weight: 119 lb 9.6 oz (54.3 kg)   Height: 5\' 2"  (1.575 m)     Body mass index is 21.88 kg/m.  General:  Alert and oriented, no acute distress HEENT: Normal Neck: No bruit or JVD Pulmonary: Clear to auscultation bilaterally Cardiac: Regular Rate and Rhythm without murmur Gastrointestinal: Soft,  non-tender, non-distended, no mass, no scars Skin: No rash Extremity Pulses:  2+ radial, brachial pulses bilaterally Musculoskeletal: No deformity or edema  Neurologic: Upper and lower extremity motor 5/5 and symmetric  DATA:    Right Carotid Findings:  +----------+--------+--------+--------+------------------+--------+       PSV cm/sEDV cm/sStenosisPlaque DescriptionComments  +----------+--------+--------+--------+------------------+--------+  CCA Prox 74   20                      +----------+--------+--------+--------+------------------+--------+  CCA Mid  75   21                      +----------+--------+--------+--------+------------------+--------+  CCA Distal78   24                      +----------+--------+--------+--------+------------------+--------+  ICA Prox 70   24   1-39%  calcific and focal      +----------+--------+--------+--------+------------------+--------+  ICA Mid  106   37                      +----------+--------+--------+--------+------------------+--------+  ICA Distal132   35                tortuous  +----------+--------+--------+--------+------------------+--------+  ECA    64   0                       +----------+--------+--------+--------+------------------+--------+   +----------+--------+-------+----------------+-------------------+       PSV cm/sEDV cmsDescribe    Arm Pressure (mmHG)  +----------+--------+-------+----------------+-------------------+    0   Multiphasic, WNL            +----------+--------+-------+----------------+-------------------+   +---------+--------+---+--------+--+---------+  VertebralPSV cm/s110EDV  cm/s20Antegrade  +---------+--------+---+--------+--+---------+       Left Carotid Findings:  +----------+--------+--------+--------+------------------+--------+       PSV cm/sEDV cm/sStenosisPlaque DescriptionComments  +----------+--------+--------+--------+------------------+--------+  CCA Prox 86   22                      +----------+--------+--------+--------+------------------+--------+  CCA Mid  77   21                      +----------+--------+--------+--------+------------------+--------+  CCA Distal80   24                      +----------+--------+--------+--------+------------------+--------+  ICA Prox 34   13   1-39%  irregular           +----------+--------+--------+--------+------------------+--------+  ICA Mid  79   29                      +----------+--------+--------+--------+------------------+--------+  ICA Distal83   30                      +----------+--------+--------+--------+------------------+--------+  ECA    61   11                      +----------+--------+--------+--------+------------------+--------+   +----------+--------+--------+----------------+-------------------+       PSV cm/sEDV cm/sDescribe    Arm Pressure (mmHG)  +----------+--------+--------+----------------+-------------------+  [id number]   0    Multiphasic, WNL            +----------+--------+--------+----------------+-------------------+   +---------+--------+--+--------+--+---------+  VertebralPSV cm/s93EDV cm/s23Antegrade  +---------+--------+--+--------+--+---------+      Summary:  Right Carotid: Velocities in the right ICA are consistent with a 1-39%  stenosis.   Left Carotid: Velocities in the left ICA are consistent with a 1-39%  stenosis.   Vertebrals: Bilateral vertebral  arteries demonstrate antegrade flow.  Subclavians: Normal flow hemodynamics were seen in bilateral subclavian        arteries.   ASSESSMENT:  History of symptomatic left ICA stenosis s/p left CEA  The duplex shows no recurrent stenosis and B ICA are < 39% stenosis   PLAN: She stays active and is a non smoker.  She will continue to take Crestor and ASA daily.  We reviewed signs and symptoms of Stroke /TIA.  If these occur she will call 911. F/U for repeat carotid duplex in 1 year.   Mosetta Pigeon PA-C [facility] Office: [phone]  MD in clinic [place]

## 2020-08-29 NOTE — Telephone Encounter (Signed)
Called patient to follow up on PAP application. Left message.

## 2020-08-31 NOTE — Progress Notes (Unsigned)
Patient left, had a migraine

## 2020-09-01 ENCOUNTER — Ambulatory Visit: Payer: Medicare PPO | Admitting: Neurology

## 2020-09-01 ENCOUNTER — Other Ambulatory Visit: Payer: Self-pay

## 2020-09-01 DIAGNOSIS — G43719 Chronic migraine without aura, intractable, without status migrainosus: Secondary | ICD-10-CM

## 2020-09-02 ENCOUNTER — Ambulatory Visit (INDEPENDENT_AMBULATORY_CARE_PROVIDER_SITE_OTHER): Payer: Medicare PPO | Admitting: Adult Health

## 2020-09-02 DIAGNOSIS — G43719 Chronic migraine without aura, intractable, without status migrainosus: Secondary | ICD-10-CM

## 2020-09-02 NOTE — Progress Notes (Signed)
       BOTOX PROCEDURE NOTE FOR MIGRAINE HEADACHE    Contraindications and precautions discussed with patient(above). Aseptic procedure was observed and patient tolerated procedure. Procedure performed by Butch Penny, NP  The condition has existed for more than 6 months, and pt does not have a diagnosis of ALS, Myasthenia Gravis or Lambert-Eaton Syndrome.  Risks and benefits of injections discussed and pt agrees to proceed with the procedure.  Written consent obtained  These injections are medically necessary. She receives good benefits from these injections.. These injections do not cause sedations or hallucinations which the oral therapies may cause.  Indication/Diagnosis: chronic migraine BOTOX(J0585) injection was performed according to protocol by [employer]. 200 units of BOTOX was dissolved into 4 cc NS.   NDC: [address]-89  Type of toxin: Botox  Botox- 200 units x 1 vial Lot: [id number] Expiration: 04/2023 NDC: 2119-4174-08   Bacteriostatic 0.9% Sodium Chloride- 24mL total [id number] Expiration: 02/23 NDC: 14970-263-78      Description of procedure:  The patient was placed in a sitting position. The standard protocol was used for Botox as follows, with 5 units of Botox injected at each site:   -Procerus muscle, refused  -Corrugator muscle, refused  -Frontalis muscle, bilateral injection, with 2 sites each side, medial injection was performed in the upper one third of the frontalis muscle, in the region vertical from the medial inferior edge of the superior orbital rim. The lateral injection was again in the upper one third of the forehead vertically above the lateral limbus of the cornea, 1.5 cm lateral to the medial injection site.  -Temporalis muscle injection, 4 sites, bilaterally. The first injection was 3 cm above the tragus of the ear, second injection site was 1.5 cm to 3 cm up from the first injection site in line with the tragus of the ear. The third  injection site was 1.5-3 cm forward between the first 2 injection sites. The fourth injection site was 1.5 cm posterior to the second injection site.  -Occipitalis muscle injection, 3 sites, bilaterally. The first injection was done one half way between the occipital protuberance and the tip of the mastoid process behind the ear. The second injection site was done lateral and superior to the first, 1 fingerbreadth from the first injection. The third injection site was 1 fingerbreadth superiorly and medially from the first injection site.  -Cervical paraspinal muscle injection, 2 sites, bilateral knee first injection site was 1 cm from the midline of the cervical spine, 3 cm inferior to the lower border of the occipital protuberance. The second injection site was 1.5 cm superiorly and laterally to the first injection site.  -Trapezius muscle injection was performed at 3 sites, bilaterally. The first injection site was in the upper trapezius muscle halfway between the inflection point of the neck, and the acromion. The second injection site was one half way between the acromion and the first injection site. The third injection was done between the first injection site and the inflection point of the neck.   Will return for repeat injection in 3 months.   Any Botox that was not use was wasted.  The patient tolerated the procedure well with no complications  Butch Penny, MSN, NP-C 09/02/2020, 3:36 PM [facility] [address], [address] [place], [place] [address] [phone]

## 2020-09-02 NOTE — Progress Notes (Signed)
Botox- 200 units x 1 vial Lot: [id number] Expiration: 04/2023 NDC: 7341-9379-02   Bacteriostatic 0.9% Sodium Chloride- 28mL total [id number] Expiration: 02/23 NDC: 92426-834-19   B/B **

## 2020-09-03 ENCOUNTER — Telehealth: Payer: Self-pay

## 2020-09-03 NOTE — Telephone Encounter (Signed)
Patient called requesting to speak with Rachael regarding the patient assistance application.  Patient states she "has a couple of questions" and requested a return call.

## 2020-09-03 NOTE — Telephone Encounter (Signed)
Returned call and answered patient's questions on the [employer] PAP application.  Patient will drop off at the office this week.

## 2020-09-22 ENCOUNTER — Ambulatory Visit: Payer: Medicare PPO | Admitting: Internal Medicine

## 2020-09-29 NOTE — Telephone Encounter (Signed)
LVM for patient to follow up on PAP application.- 2nd attempt

## 2020-09-30 ENCOUNTER — Other Ambulatory Visit: Payer: Self-pay

## 2020-09-30 ENCOUNTER — Encounter: Payer: Self-pay | Admitting: Internal Medicine

## 2020-09-30 ENCOUNTER — Ambulatory Visit: Payer: Medicare PPO | Admitting: Internal Medicine

## 2020-09-30 DIAGNOSIS — J8410 Pulmonary fibrosis, unspecified: Secondary | ICD-10-CM | POA: Diagnosis not present

## 2020-09-30 DIAGNOSIS — R059 Cough, unspecified: Secondary | ICD-10-CM

## 2020-09-30 NOTE — Assessment & Plan Note (Signed)
Assoc with RA      - PFT's 06/15/2006 FEV1 (1.53)  Ratio 84 and DLC0 46 corrects to 90%     - PFT's  03/04/2011  FEV1 (1.73)  Ratio 83 and VC 2.11 and DLC0 63% corrects  94%     - PFT's 11/13/2013    VC 1.94 with DLCO 65 corrects to 102%     - 08/27/2013  Walked RA x 2 laps @ 185 ft each stopped due to  Oximeter stopped recording accurately but sats still 96% and no sob - PFT's  06/14/2017  FVC 1.71  (60 % ) ratio 89  p 5 % improvement from saba p nothing prior to study with DLCO  59 % corrects to 97 % for alv volume   PFT's  06/21/2018  FVC  1.85 (65%)   p no % improvement from saba p nothing prior to study with DLCO  65 % corrects to 106  % for alv volume   PFT's  06/22/2019  FVC 1.68 (58 % )   with DLCO  12.93 (70%) corrects to 4.59 (108 %)  for alv volume and FV curve nl   CHEST  HRCT  03/03/20  1. Pulmonary parenchymal pattern of fibrosis appears grossly stable from 07/21/2010. Findings are consistent with UIP per consensus guidelines: Diagnosis of Idiopathic Pulmonary Fibrosis: An Official ATS/ERS/JRS/ALAT Clinical Practice Guideline. Am Rosezetta Schlatter Crit Care Med Vol 198, Iss 5, [phone], Mar 19 2017. 2. Aortic atherosclerosis (ICD10-I70.0). Coronary artery calcification. 3. Enlarged pulmonic trunk, indicative of pulmonary arterial hypertension.      - Echo   05/12/20  LVH nod / GII diastolic dysfunction /  PAS around 40 with nl RV -  06/23/2020   Walked RA  3laps @ approx 257ft each @ brisk pace  stopped due to end of study, no desats, no sob or CP with sats at end = 97%  Good control of systemic dz has halted the progression clinically and radiolgraphically of the assoc pf   rec continue present rx/ monitor 02 sats at peak ex (not after ex) to reflect PF severity/ progression and if no decline then f/u yearly ok

## 2020-09-30 NOTE — Progress Notes (Signed)
Subjective:     Patient ID: Brianna [name], female   DOB: February 20, 1951,     MRN: [id number]       Brief patient profile:  70 yowf   quit smoking age 70 with no resp problems then @  36 bad pneumonia.  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   No sign doe but not that active.   Sleeping ok without nocturnal  or early am exac of resp c/o's or need for noct saba.  rec Ok to restart boniva the first of September and every month but if your respiratory or reflux symptoms worsen it's first medication I would stop and take in it's place Reclast IV yearly.    11/13/2013 f/u ov/Wert re:  PF assoc with RA Chief Complaint  Patient presents with  . Followup with PFT    Pt states that her breathing is unchanged since her last visit. No new co's today.    can now do 1st to 2nd floor s sob and working out regularly s limits due to sob rec  Return in one year for pfts and cxr and call sooner if not decline in tolerance > did not return       04/22/2017  [facility] office visit/ Wert  / re-establish re RA assoc ILD  Chief Complaint  Patient presents with  . Pulmonary Consult    Self referral for cough x 3-4 months. She is coughing up white to yellow sputum.    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  rec Augmentin 875 mg take one pill twice daily  X 10 days - take at breakfast and supper with large glass of water.  It would help reduce the usual side effects (diarrhea and yeast infections) if you ate cultured yogurt at lunch.  Prednisone 10 mg take  4 each am x 2 days,   2 each am x 2 days,  1 each am x 2 days and stop  Stop fosfamax for now  Pantoprazole (protonix) 40 mg   Take  30-60 min before first meal of the day and Pepcid (famotidine)  20 mg one @  bedtime until return to office - this is the best way to tell whether stomach acid is contributing to your problem.   GERD diet     06/14/2017  f/u ov/Wert re:  uacs ? Aggravated by fosfamax  Chief Complaint  Patient presents with  . Follow-up    PFT done today. Her cough is much improved. No new co's. She wants to discuss alternative med for bones since we stopped the fosamax.  cough still present somedays  p stirring in am / no pm cough at all which is quite an improvement s need for cough suppression Rates herself at 90% better  rec Restart fosmax 2 weeks before visit (= 2 doses ) prior to your visit with your rheumatologist Risk of reflux goes up on fosfamax so ideally I would recommend a substitute either Reclast or Prolia if feasible but defer the final call on this issue to your rheumatologist     06/21/2018  f/u ov/Wert re:  ? RA lung dz  Chief Complaint  Patient presents with  . Follow-up    PFT's done. Cough has resolved. No new co's.   Dyspnea:  Not limited by breathing from desired activities   Cough: none Sleeping: 20 degrees helps with nasal congestion SABA use: none 02: none  Systemic RA control good x has retinal involvement > f/u [facility]  rec Regular physical activity with monitoring of 02 saturations toward the end of exercise (nl values lower 90's) and call if trending down    06/22/2019  f/u ov/Wert re: ? RA lung dz  Chief Complaint   Patient presents with  . Follow-up    PFT done today. Increased dry cough in the am.    Dyspnea:  Not limited by breathing from desired activities  But not doing aerobics due to gym Cough: throat clearing assoc with nasal congestion x 3 m/ has not tried otcs Sleeping: 20 degrees HOB up  SABA use: none  02: none  rec Make sure you check your oxygen saturations at highest level of activity  Try zyrtec 10 mg at bedtime  (24 hour pill) to helps nasal symptoms if not consider ENT evaluation> did not use as risk of dry eyes Add:  Cough flared off ppi ac so add back if not better on zyrtec    06/23/2020  f/u ov/Wert re: ?  RA lung dz  Chief Complaint  Patient presents with  . Follow-up    Pulm fibrosis, Cough, still having dry cough, occ yellow mucus.Chest tightness with exertion  Dyspnea:  Walking the dog at brisk pace /  predictable R anterior cp when walks briskly up incline  Cough: throat clearing better in afternoon/ min yellow mucus/  Sleeping: 20-30 degrees  SABA use: none 02: none  rec GERD  Diet  Try allegra or clariton over  the counter to see if helps your sensation of post nasal drainage  Make sure you check your oxygen saturations at highest level of activity   Please remember to go to the lab department /ct sinus   - Allergy profile 06/23/2020 >  Eos 0.5/  IgE  59 - Sinus CT 07/04/2020 >>> Clear paranasal sinuses. Mild proximal narrowing of the bilateral ostiomeatal units secondary to mucosal thickening         09/30/2020  f/u ov/Wert re: pnds not able to take any antihistamines  Chief Complaint  Patient presents with  . Follow-up    Cough is unchanged since there last visit. She has prod cough with yellow to clear sputum in the mornings.   Dyspnea: walking dog brisk pace/ up hills no more cps / no desats when checks but this is usually p ex  Cough: worse in am's x 3 years / can't take any inhistamines / assoc with pnds Sleeping: 30 degrees electric bed   comfortable SABA use: none 02: none  Covid status:   vax x 3    No obvious day to day or daytime variability or assoc excess/  purulent sputum or mucus plugs or hemoptysis or cp or chest tightness, subjective wheeze or overt sinus or hb symptoms.   Sleeping  without nocturnal  or early am exacerbation  of respiratory  c/o's or need for noct saba. Also denies any obvious fluctuation of symptoms with weather or environmental changes or other aggravating or alleviating factors except as outlined above   No unusual exposure hx or h/o childhood pna/ asthma or knowledge of premature birth.  Current Allergies, Complete Past Medical History, Past Surgical History, Family History, and Social History were reviewed in [employer] record.  ROS  The following are not active complaints unless bolded Hoarseness, sore throat, dysphagia, dental problems, itching, sneezing,  nasal congestion or discharge of excess mucus or purulent secretions, ear ache,   fever, chills, sweats, unintended wt loss or wt gain, classically pleuritic or exertional cp,  orthopnea pnd or arm/hand swelling  or leg swelling, presyncope, palpitations, abdominal pain, anorexia, nausea, vomiting, diarrhea  or change in bowel habits or change in bladder habits, change in stools or change in urine, dysuria, hematuria,  rash, arthralgias, visual complaints, headache, numbness, weakness or ataxia or problems with walking or coordination,  change in mood or  memory.        Current Meds  Medication Sig  . ACETAMINOPHEN-BUTALBITAL 50-325 MG TABS Take by mouth daily as needed.  Marland Kitchen amLODipine (NORVASC) 10 MG tablet Take 10 mg by mouth daily.  . Ascorbic Acid 500 MG CAPS Take by mouth.  Marland Kitchen aspirin 325 MG tablet Take 325 mg by mouth daily.  . botulinum toxin Type A (BOTOX) 100 units SOLR injection Inject IM into the head and neck muscles every 3 months by provider in office  . butalbital-acetaminophen-caffeine (FIORICET, ESGIC)  50-325-40 MG tablet Take 1 tablet by mouth every 6 (six) hours as needed for headache.  . Calcium Carbonate-Vit D-Min (CALCIUM 1200 PO) Take 2 capsules by mouth daily.  . chlorthalidone (HYGROTON) 25 MG tablet Take 1 tablet (25 mg total) by mouth daily.  . Cholecalciferol (VITAMIN D PO) Take 1,000 Units by mouth daily.   . Difluprednate (DUREZOL) 0.05 % EMUL 6 drops in left eye and 2 in right eye daily.  Marland Kitchen DILANTIN 100 MG ER capsule TAKE 3 CAPSULES BY MOUTH DAILY  . ezetimibe (ZETIA) 10 MG tablet Take 10 mg by mouth daily.  . famotidine (PEPCID) 20 MG tablet TAKE 1 TABLET(20 MG) BY MOUTH AT BEDTIME  . HUMIRA PEN 40 MG/0.4ML PNKT Inject 40 mg into the skin every 14 (fourteen) days. As directed  . hypromellose (GENTEAL) 0.3 % GEL ophthalmic ointment as needed.  Marylin Crosby Succinate (REYVOW) 100 MG TABS Take 100 mg by mouth daily as needed.  Marland Kitchen LORazepam (ATIVAN) 0.5 MG tablet Take 0.5 mg by mouth daily as needed for anxiety or sleep.  Marland Kitchen losartan (COZAAR) 100 MG tablet Take 1 tablet (100 mg total) by mouth daily. (Patient taking differently: Take 200 mg by mouth daily.)  . pantoprazole (PROTONIX) 40 MG tablet TAKE 1 TABLET(40 MG) BY MOUTH DAILY  . promethazine (PHENERGAN) 25 MG tablet Take 1 tablet (25 mg total) by mouth every 6 (six) hours as needed for nausea or vomiting.  . RESTASIS 0.05 % ophthalmic emulsion 2 (two) times daily.  . Rimegepant Sulfate (NURTEC) 75 MG TBDP Take 75 mg by mouth daily as needed. For migraines. Take as close to onset of migraine as possible. One daily maximum.  . rosuvastatin (CRESTOR) 10 MG tablet 1 tablet  .  tacrolimus (PROGRAF) 1 MG capsule Take 1 mg by mouth 2 (two) times daily.  Marland Kitchen Ubrogepant (UBRELVY) 50 MG TABS Take 50 mg by mouth every 2 (two) hours as needed. Max 200mg  a day   Current Facility-Administered Medications for the 09/30/20 encounter (Office Visit) with 10/02/20, MD  Medication  . pneumococcal 23 valent vaccine (PNU-IMMUNE) injection 0.5 mL                   Objective:   Physical Exam     09/30/2020    118 06/23/2020   122  06/22/2019  128  06/21/2018    126   Wt 130 01/14/2011 > 08/27/2013  126 >  11/13/2013 125 > 04/22/2017  125 > 06/14/2017   127 > 10/11/2017  124 >   Vital signs reviewed  09/30/2020  - Note at rest 02 sats  95% on RA   General appearance:    Pleasant elderly wf nad   HEENT : pt wearing mask not removed for exam due to covid -19 concerns.    NECK :  without JVD/Nodes/TM/ nl carotid upstrokes bilaterally   LUNGS: no acc muscle use,  Nl contour chest with trace insp crackles bases  bilaterally without cough on insp or exp maneuvers   CV:  RRR  no s3 or murmur or increase in P2, and no edema   ABD:  soft and nontender with nl inspiratory excursion in the supine position. No bruits or organomegaly appreciated, bowel sounds nl  MS:  Nl gait/ ext warm without deformities, calf tenderness, cyanosis or clubbing No obvious joint restrictions   SKIN: warm and dry without lesions    NEURO:  alert, approp, nl sensorium with  no motor or cerebellar deficits apparent.             Assessment:

## 2020-09-30 NOTE — Assessment & Plan Note (Addendum)
Onset around 2012   - Sinus CT  01/14/2011 > neg - Trial off fosfamax and on GERD rx 04/22/17 > improved 06/14/2017  - recurrent fall 2020 > try zyrtec and if not better add back ppi ac  - Allergy profile 06/23/2020 >  Eos 0.5/  IgE  59 - Sinus CT 07/04/2020 >>> Clear paranasal sinuses. Mild proximal narrowing of the bilateral ostiomeatal units secondary to mucosal thickening - 09/30/2020 trial of flonase/afrin rec if not better > ent next   Reviewed with pt: I emphasized that nasal steroids have no immediate benefit in terms of improving symptoms.  To help them reached the target tissue, the patient should use Afrin two puffs every 12 hours applied one min before using the nasal steroids.  Afrin should be stopped after no more than 5 days.  If the symptoms worsen, Afrin can be restarted after 5 days off of therapy to prevent rebound congestion from overuse of Afrin.  I also emphasized that in no way are nasal steroids a concern in terms of "addiction".    F/u ent prn           Each maintenance medication was reviewed in detail including emphasizing most importantly the difference between maintenance and prns and under what circumstances the prns are to be triggered using an action plan format where appropriate.  Total time for H and P, chart review, counseling, reviewing nasal device(s) and generating customized AVS unique to this office visit / same day charting = 30 min

## 2020-09-30 NOTE — Patient Instructions (Addendum)
Flonase twice daily each nostril   If not better :  I emphasized that nasal steroids(flonase)  have no immediate benefit in terms of improving symptoms.  To help them reached the target tissue, the patient should use Afrin two puffs every 12 hours applied one min before using the nasal steroids.  Afrin should be stopped after no more than 5 days.  If the symptoms worsen, Afrin can be restarted after 5 days off of therapy to prevent rebound congestion from overuse of Afrin.  I also emphasized that in no way are nasal steroids a concern in terms of "addiction".   If still not better > see you ENT next and let him know about your CT sinus   Make sure you check your oxygen saturation at your highest level of activity to be sure it stays over 90% and keep track of it at least once a week, more often if breathing getting worse, and let me know if losing ground.    Please schedule a follow up visit in 12 months but call sooner if needed

## 2020-10-01 DIAGNOSIS — H3321 Serous retinal detachment, right eye: Secondary | ICD-10-CM | POA: Diagnosis not present

## 2020-10-01 DIAGNOSIS — Z9841 Cataract extraction status, right eye: Secondary | ICD-10-CM | POA: Diagnosis not present

## 2020-10-01 DIAGNOSIS — Z961 Presence of intraocular lens: Secondary | ICD-10-CM | POA: Diagnosis not present

## 2020-10-01 DIAGNOSIS — Z79899 Other long term (current) drug therapy: Secondary | ICD-10-CM | POA: Diagnosis not present

## 2020-10-01 DIAGNOSIS — H401123 Primary open-angle glaucoma, left eye, severe stage: Secondary | ICD-10-CM | POA: Diagnosis not present

## 2020-10-01 DIAGNOSIS — H401133 Primary open-angle glaucoma, bilateral, severe stage: Secondary | ICD-10-CM | POA: Diagnosis not present

## 2020-10-01 DIAGNOSIS — Z947 Corneal transplant status: Secondary | ICD-10-CM | POA: Diagnosis not present

## 2020-10-16 NOTE — Telephone Encounter (Signed)
Called patient to follow up on PAP application. No answer/ VM- 3rd attempt. Closing encounter.

## 2020-10-16 NOTE — Progress Notes (Signed)
Office Visit Note  [name]             Date of Birth: Feb 04, 1951           MRN: [id number]             PCP: Brianna Reichmann, DO Referring: Brianna Reichmann, DO Visit Date: 10/30/2020 Occupation: @GUAROCC @  Subjective:  Discuss osteoporosis treatment   History of Present Illness: Brianna [name] is a 70 y.o. female with history of seropositive rheumatoid arthritis, ILD, and iritis.  She is on humira 40 mg sq injections every 14 days and arava 20 mg by mouth daily.  At her last office visit on 07/31/2020 she was clinically doing well so Dr. 08/02/2020 advised the patient to try reducing Arava to half a tablet daily for 1 week and if she did not develop increased pain she could discontinue.  According to the patient she recently underwent oral surgery so she was holding Humira during that time.  She decided to continue on Arava due to the concern for flaring while holding Humira.  She restarted on Humira on Monday of this week.  She will be following up with her periodontist and dentist soon.  She would like to eventually discontinue arava once she is able to consistently stay on Humira.  She denies any signs or symptoms of a flare. She is not having any joint pain or joint swelling at this time. She has occasional lower back pain but overall it has been tolerable, especially if she sleeps with a pillow between her knees at night.  She states that her discomfort due to trochanteric bursitis has improved significantly.  She denies any recent falls or fractures.  She has completed her patient assistance application for forteo but she does not think she will qualify due to low income threshold. In the past she could not tolerate fosamax due to GERD.  She was concerned about ONJ so she did not want to proceed with reclast.  She continues to take a calcium and vitamin D supplement daily.   She denies any new or worsening pulmonary symptoms.  Her most recent appointment with Dr. Tuesday was on 09/30/20  at which time her ILD was thought to be stable.  She received the 2nd covid-19 booster yesterday.        Activities of Daily Living:  Patient reports morning stiffness for 5-10 minutes.   Patient Denies nocturnal pain.  Difficulty dressing/grooming: Denies Difficulty climbing stairs: Denies Difficulty getting out of chair: Denies Difficulty using hands for taps, buttons, cutlery, and/or writing: Reports  Review of Systems  Constitutional: Negative for fatigue.  HENT: Positive for mouth sores. Negative for mouth dryness and nose dryness.   Eyes: Positive for dryness. Negative for pain and visual disturbance.  Respiratory: Negative for cough, hemoptysis, shortness of breath and difficulty breathing.   Cardiovascular: Negative for chest pain, palpitations, hypertension and swelling in legs/feet.  Gastrointestinal: Negative for blood in stool, constipation and diarrhea.  Endocrine: Negative for increased urination.  Genitourinary: Negative for painful urination.  Musculoskeletal: Positive for morning stiffness. Negative for arthralgias, joint pain, joint swelling, myalgias, muscle weakness, muscle tenderness and myalgias.  Skin: Positive for color change. Negative for pallor, rash, hair loss, nodules/bumps, skin tightness, ulcers and sensitivity to sunlight.  Allergic/Immunologic: Negative for susceptible to infections.  Neurological: Positive for headaches. Negative for dizziness, numbness and weakness.  Hematological: Negative for swollen glands.  Psychiatric/Behavioral: Negative for depressed mood and sleep disturbance. The patient is  not nervous/anxious.     PMFS History:  Patient Active Problem List   Diagnosis Date Noted  . Anxiety 08/28/2020  . Carotid artery disease (HCC) 08/28/2020  . Late effects of cerebrovascular disease 08/28/2020  . Macular degeneration 08/28/2020  . Other long term (current) drug therapy 08/28/2020  . 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  .  Pure hypercholesterolemia 08/28/2020  . Vitamin D deficiency 08/28/2020  . Exertional chest pain 06/24/2020  . Hypertension 05/12/2020  . 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 Acute urinary tract infection 11/13/2019  . Epilepsy (HCC) 11/13/2019  . Corneal ulcer of left eye 12/05/2018  . Acute left eye pain 12/05/2018  . Endophthalmitis, acute, left 12/05/2018  . URI, acute 05/19/2018  . Cerumen impaction 05/19/2018  . Laceration of left middle finger without foreign body without damage to nail 11/23/2017  . Trigger thumb, right thumb 10/05/2017  . 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  . Numbness 04/25/2017  . Pain 04/25/2017  . History of macular degeneration 09/13/2016  . History of migraine 09/06/2016  . History of seizures 09/06/2016  . 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 Secondary corneal edema of right eye 06/28/2016  . Iritis of right eye 05/12/2016  . Rheumatoid arthritis with positive rheumatoid factor (HCC) 05/11/2016  . 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  . High risk medication use 05/11/2016  . Osteoporosis 05/11/2016  . 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 Peripheral focal chorioretinal inflammation of both eyes 01/04/2016  . Squamous blepharitis of upper and lower eyelids of both eyes 10/22/2015  . Dry eyes, bilateral 10/21/2015  . History of high risk medication treatment 04/28/2015  . Cystoid macular edema of left eye 09/23/2014  . History of carotid endarterectomy 06/27/2014  . Acquired myogenic ptosis of both eyelids 06/07/2014  . 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  . Migraine without aura 04/19/2013  . Seizures (HCC) 04/19/2013  . 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 Minor corneal opacity 08/02/2011  . 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  . Cough 01/14/2011  . Pulmonary fibrosis (HCC) 01/14/2011    Past Medical History:  Diagnosis Date  . Arthritis     in the neck  . Carotid artery occlusion   . Epilepsy (HCC)   . GERD (gastroesophageal reflux disease)   . Glaucoma   . Hyperlipidemia   . Hypertension   . Keratoconus of both eyes 1981  . Macular degeneration   . Migraines   . Neuromuscular disorder (HCC)    RA  . Peripheral vascular disease (HCC)    carotid occlusion surgery on left  . Raynaud's disease   . Retinal edema   . Rheumatoid arthritis(714.0)   . Stroke [facility])    two mild strokes presumed from left carotid stenosis    Family History  Problem Relation Age of Onset  . Heart disease Mother   . Lung disease Mother        ? disease process  . Uterine cancer Mother   . Heart disease Father   . Clotting disorder Father   . Collagen disease Father   . Cervical cancer Maternal Aunt   . Prostate cancer Maternal Grandfather   . Rheum arthritis Sister   . High Cholesterol Sister   . Epilepsy Sister   . Rheum arthritis Maternal Grandmother   . High Cholesterol Brother   . High blood pressure Brother   . High blood pressure Brother   . High Cholesterol Brother   . High  blood pressure Brother   . High Cholesterol Brother    Past Surgical History:  Procedure Laterality Date  . CAROTID ARTERY ANGIOPLASTY  2008   Dr Madilyn Fireman  . CAROTID ENDARTERECTOMY Left 11-15-2007  . cataract extraction Right 04-2014   [facility]  . CHOLECYSTECTOMY  1995  . CORNEAL TRANSPLANT     x 5 ; steroid inj. retnal information  . CORNEAL TRANSPLANT Right 01-22-2014   [facility]  . 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 EYE SURGERY Right 12/2016   cornea repair   . EYE SURGERY Right 09/20/2017   cornea transplant   . GLAUCOMA SURGERY Right 2018  . TRIGGER FINGER RELEASE Right 05/16/2018   Procedure: RELEASE TRIGGER FINGER/A-1 PULLEY RIGHT THUMB;  Surgeon: Cindee Salt, MD;  Location: [facility];  Service: Orthopedics;  Laterality: Right;   Social History   Social History Narrative   Patient is married to [name]), has 2  children   Patient is right handed   Education level is Bachelor's degree   Caffeine consumption is about 2 cups daily   Lives at home with husband    Immunization History  Administered Date(s) Administered  . Influenza Split 04/26/2013, 04/08/2017  . Influenza, High Dose Seasonal PF 04/09/2018  . Influenza, Quadrivalent, Recombinant, Inj, Pf 04/21/2017, 03/19/2019  . Influenza-Unspecified 04/19/2016  . PFIZER(Purple Top)SARS-COV-2 Vaccination 08/24/2019, 09/18/2019, 05/12/2020, 10/29/2020  . Pneumococcal Conjugate-13 08/27/2013, 07/05/2017  . Pneumococcal Polysaccharide-23 01/14/2011  . Tdap 11/15/2017  . Zoster Recombinat (Shingrix) 07/19/2019, 11/28/2019     Objective: Vital Signs: BP 137/82 (BP Location: Left Arm, Patient Position: Sitting, Cuff Size: Normal)   Pulse 76   Resp 14   Ht  (1.575 m)   Wt 119 lb (54 kg)   BMI 21.77 kg/m    Physical Exam Vitals and nursing note reviewed.  Constitutional:      Appearance: She is well-developed.  HENT:     Head: Normocephalic and atraumatic.  Eyes:     Conjunctiva/sclera: Conjunctivae normal.  Pulmonary:     Effort: Pulmonary effort is normal.  Abdominal:     Palpations: Abdomen is soft.  Musculoskeletal:     Cervical back: Normal range of motion.  Skin:    General: Skin is warm and dry.     Capillary Refill: Capillary refill takes less than 2 seconds.  Neurological:     Mental Status: She is alert and oriented to person, place, and time.  Psychiatric:        Behavior: Behavior normal.      Musculoskeletal Exam: C-spine, thoracic spine, and lumbar spine good ROM.  Shoulder joints, elbow joints, wrist joints, MCPs, PIPs, and DIPs good ROM with no synovitis.  PIP and DIP prominence bilaterally.  Hip joints good ROM with no discomfort.  No tenderness to palpation over trochanteric bursa.  Knee joints good ROM with no discomfort.  No warmth or effusion of knee joints.  No tenderness or swelling of ankle joints.     CDAI Exam: CDAI Score: -- Patient Global: --; Provider Global: -- Swollen: --; Tender: -- Joint Exam 10/30/2020   No joint exam has been documented for this visit   There is currently no information documented on the homunculus. Go to the Rheumatology activity and complete the homunculus joint exam.  Investigation: No additional findings.  Imaging: No results found.  Recent Labs: Lab Results  Component Value Date   WBC 5.6 06/23/2020   HGB 13.4 06/23/2020   PLT 243.0 06/23/2020  NA 139 10/28/2020   K 4.4 10/28/2020   CL 97 10/28/2020   CO2 26 10/28/2020   GLUCOSE 101 (H) 10/28/2020   BUN 16 10/28/2020   CREATININE 0.78 10/28/2020   BILITOT 0.3 05/26/2020   ALKPHOS 104 05/26/2020   AST 39 05/26/2020   ALT 31 05/26/2020   PROT 7.7 05/26/2020   ALBUMIN 4.6 05/26/2020   CALCIUM 9.4 10/28/2020   GFRAA 107 05/26/2020   QFTBGOLDPLUS NEGATIVE 05/31/2018    Speciality Comments: Last Reclast 11/30/2017   Procedures:  No procedures performed Allergies: Morphine, Morphine and related, Alphagan [brimonidine], Codeine, Dorzolamide hcl-timolol mal, Hydrocodone-acetaminophen, Sulfa antibiotics, and Sulfamethoxazole     Assessment / Plan:     Visit Diagnoses: Rheumatoid arthritis involving multiple sites with positive rheumatoid factor (HCC): She has no joint tenderness or synovitis.  She has not had any recent rheumatoid arthritis flares.  She has not experienced any joint pain, joint swelling, nocturnal pain, or difficulty with ADLs at this time.  At her last office visit on 07/31/2020 with Dr. Corliss Skains she was advised to discontinue Arava since she was clinically doing well on Humira and tacrolimus.  She underwent oral surgery performed by her periodontist recently at which time she was advised to hold Humira.  Due to the concern for flaring while off of both Humira she continued on Arava 20 mg 1 tablet by mouth daily.  She restarted on Humira on 10/27/20.  She plans on  discontinuing Arava once she is able to stay on Humira consistently.  She advise notify us if she develops increased joint pain or joint swelling.  She will follow-up in the office in 5 months.    High risk medication use - Humira 40 mg sq every 14 days and Arava 20 mg po daily.  She will be discontinuing Arava. CBC and CMP updated on 07/02/20.  BMP drawn on 10/28/20.  CBC and hepatic function panel were checked today.  Her next lab work will be due in July and every 3 months to monitor for drug toxicity.  Standing orders for CBC and CMP are in place.  TB gold negative on 02/15/20.  Future order for TB gold placed today.- Plan: CBC with Differential/Platelet, Hepatic function panel, QuantiFERON-TB Gold Plus She has not had any recent infections. She has had 4 pfizer covid-19 vaccine doses.   Advised to hold Arava and humira if she develops signs or symptoms of an infection and to resume once the infection has completely cleared.   Screening for tuberculosis - Future order for TB gold placed today. Plan: QuantiFERON-TB Gold Plus  ILD (interstitial lung disease) (HCC) - She is followed by Dr. Johney Maine recent appointment 09/30/20.  She has been clinically stable and her PFTs were stable on 06/22/2019.  High resolution chest CT updated on 03/03/20: Pulmonary parenchymal pattern of fibrosis appears grossly stable from 07/21/2010. Findings are consistent with UIP per consensus guidelines.  She has not developed any new or worsening pulmonary symptoms.   Iritis of right eye - Followed by Dr.Shah, Dr. Dan Humphreys, and Dr. Lottie Dawson. She is on Humira 40 mg subcutaneous injections every 14 days, which is prescribed by Dr. Sherryll Burger. Previously on cyclosporine which was discontinued due to elevated BP.  She remains on tacrolimus as prescribed by Dr. Sherryll Burger.   Trochanteric bursitis of both hips - Resolved. No tenderness to palpation on exam.  DDD (degenerative disc disease), lumbar: She has intermittent discomfort. No symptoms of  radiculopathy.   Age-related osteoporosis without current pathological fracture -  DEXA on 11/13/19: L Left femoral neck T-score -3.0, Right FN T-score -2.6, distal 1/2 left radius T-score -3.3.  She has not had any falls or fractures.  Previously d/c fosasamx due to GERD. She declined starting on reclast due to concern for ONJ.  Applying for [employer] patient assistance.  She will continue taking a calcium and vitamin D supplement daily.   Other medical conditions are listed as follows:   History of glaucoma  History of carotid endarterectomy  History of seizures  History of migraine  History of macular degeneration    Orders: Orders Placed This Encounter  Procedures  . CBC with Differential/Platelet  . Hepatic function panel  . QuantiFERON-TB Gold Plus   No orders of the defined types were placed in this encounter.     Follow-Up Instructions: Return in about 5 months (around 04/01/2021) for Rheumatoid arthritis, ILD.   Gearldine Bienenstock, PA-C  Note - This record has been created using Dragon software.  Chart creation errors have been sought, but may not always  have been located. Such creation errors do not reflect on  the standard of medical care.

## 2020-10-23 DIAGNOSIS — Z885 Allergy status to narcotic agent status: Secondary | ICD-10-CM | POA: Diagnosis not present

## 2020-10-23 DIAGNOSIS — Z882 Allergy status to sulfonamides status: Secondary | ICD-10-CM | POA: Diagnosis not present

## 2020-10-23 DIAGNOSIS — H401112 Primary open-angle glaucoma, right eye, moderate stage: Secondary | ICD-10-CM | POA: Diagnosis not present

## 2020-10-23 DIAGNOSIS — H401123 Primary open-angle glaucoma, left eye, severe stage: Secondary | ICD-10-CM | POA: Diagnosis not present

## 2020-10-23 DIAGNOSIS — Z888 Allergy status to other drugs, medicaments and biological substances status: Secondary | ICD-10-CM | POA: Diagnosis not present

## 2020-10-27 NOTE — Progress Notes (Signed)
Patient ID: Brianna [name]                 DOB: 10-Aug-1950                      MRN: [id number]     HPI: Brianna [name] is a 70 y.o. female referred by Dr. Anne Fu to HTN clinic. PMH is significant for HTN, HLD, PVD, RA, migraines, arthritis, epilepsy and hx stroke. Pulmonary artery trunk noted to be enlarged on CT scan, indicative of possible underlying PH. Coronary artery calcifications also noted on CT scan. Last seen by Dr. Anne Fu on 07/28/20 and BP was elevated at 142/90. Losartan was increased from 50 to 100 mg daily.   Patient presents today in good spirits. Reports medication adherence with losartan 100 mg daily, amlodipine 10 mg daily, and chlorthalidone 25 mg daily. Reports some dizziness upon standing too quickly. Reports history of migraines and blurry vision secondary to increased ocular pressure in left eye. Denies LE swelling. Pt did not bring home BP log but reports BP average 135/80. Additionally, pt could not confirm which dose of rosuvastatin she is taking at home (10 vs 20 mg). Discussed diet and exercise regimen below.  Current HTN meds: amlodipine 10 mg daily (AM), chlorthalidone 25 mg daily (AM), losartan 100 mg daily (AM) Previously tried: clonidine 0.1 mg BID (severe dry mouth, extreme fatigue, and loss of appetite) BP goal: <130/80 mmHg  Family History: father dying from MI. Epilepsy in her sister; Heart disease in her father and mother; High Cholesterol in her brother, brother, brother, and sister; High blood pressure in her brother, brother, and brother; Lung disease in her mother; Prostate cancer in her maternal grandfather; Rheum arthritis in her maternal grandmother and sister; Uterine cancer in her mother.  Social History: Former Smoker (Quit: 07/19/1988 - 0.5 ppd x14 years)  Diet: Still adding some salt to diet. Down to 2-3 cups of coffee a day (previously 4 cups daily), down to 0.5-1 energy drink a day (previosuly 2 drinks daily). Eating chicken, sometimes hamburger,  occassional steak. Needs to work on cutting down carbs and fats. Steak once a month. Tuna, salmon, pancakes, hamburgers, salads.  Does not eat breakfast  Exercise: walk with dog for 0.5 mile to 3-5 blocks, gardening, has not gone back to gym  Home BP readings: averaging 135/80s  Labs: 05/26/20: Scr 0.64, K 4.4, Na 137  Wt Readings from Last 3 Encounters:  09/30/20 118 lb (53.5 kg)  08/28/20 119 lb 9.6 oz (54.3 kg)  08/07/20 121 lb (54.9 kg)   BP Readings from Last 3 Encounters:  10/28/20 136/82  09/30/20 130/84  08/28/20 139/89   Pulse Readings from Last 3 Encounters:  09/30/20 76  08/28/20 75  08/07/20 92    Renal function: CrCl cannot be calculated (Patient's most recent lab result is older than the maximum 21 days allowed.).  Past Medical History:  Diagnosis Date  . Arthritis    in the neck  . Carotid artery occlusion   . Epilepsy (HCC)   . GERD (gastroesophageal reflux disease)   . Glaucoma   . Hyperlipidemia   . Hypertension   . Keratoconus of both eyes 1981  . Macular degeneration   . Migraines   . Neuromuscular disorder (HCC)    RA  . Peripheral vascular disease (HCC)    carotid occlusion surgery on left  . Raynaud's disease   . Retinal edema   . Rheumatoid arthritis(714.0)   .  Stroke [facility])    two mild strokes presumed from left carotid stenosis    Current Outpatient Medications on File Prior to Visit  Medication Sig Dispense Refill  . ACETAMINOPHEN-BUTALBITAL 50-325 MG TABS Take by mouth daily as needed.    Marland Kitchen amLODipine (NORVASC) 10 MG tablet Take 10 mg by mouth daily.    . Ascorbic Acid 500 MG CAPS Take by mouth.    Marland Kitchen aspirin 325 MG tablet Take 325 mg by mouth daily.    . botulinum toxin Type A (BOTOX) 100 units SOLR injection Inject IM into the head and neck muscles every 3 months by provider in office 2 vial 3  . butalbital-acetaminophen-caffeine (FIORICET, ESGIC) 50-325-40 MG tablet Take 1 tablet by mouth every 6 (six) hours as needed for  headache. 10 tablet 4  . Calcium Carbonate-Vit D-Min (CALCIUM 1200 PO) Take 2 capsules by mouth daily.    . chlorthalidone (HYGROTON) 25 MG tablet Take 1 tablet (25 mg total) by mouth daily. 30 tablet 11  . Cholecalciferol (VITAMIN D PO) Take 1,000 Units by mouth daily.     Marland Kitchen COVID-19 mRNA vaccine, Pfizer, 30 MCG/0.3ML injection INJECT AS DIRECTED .3 mL 0  . Difluprednate (DUREZOL) 0.05 % EMUL 6 drops in left eye and 2 in right eye daily.    Marland Kitchen DILANTIN 100 MG ER capsule TAKE 3 CAPSULES BY MOUTH DAILY 270 capsule 3  . ezetimibe (ZETIA) 10 MG tablet Take 10 mg by mouth daily.    . famotidine (PEPCID) 20 MG tablet TAKE 1 TABLET(20 MG) BY MOUTH AT BEDTIME 90 tablet 1  . HUMIRA PEN 40 MG/0.4ML PNKT Inject 40 mg into the skin every 14 (fourteen) days. As directed    . hypromellose (GENTEAL) 0.3 % GEL ophthalmic ointment as needed.    Brianna [name] (REYVOW) 100 MG TABS Take 100 mg by mouth daily as needed. 1 tablet 0  . LORazepam (ATIVAN) 0.5 MG tablet Take 0.5 mg by mouth daily as needed for anxiety or sleep.    Marland Kitchen losartan (COZAAR) 100 MG tablet Take 1 tablet (100 mg total) by mouth daily. (Patient taking differently: Take 200 mg by mouth daily.) 90 tablet 3  . pantoprazole (PROTONIX) 40 MG tablet TAKE 1 TABLET(40 MG) BY MOUTH DAILY 90 tablet 0  . promethazine (PHENERGAN) 25 MG tablet Take 1 tablet (25 mg total) by mouth every 6 (six) hours as needed for nausea or vomiting. 30 tablet 11  . RESTASIS 0.05 % ophthalmic emulsion 2 (two) times daily.    . Rimegepant Sulfate (NURTEC) 75 MG TBDP Take 75 mg by mouth daily as needed. For migraines. Take as close to onset of migraine as possible. One daily maximum. 4 tablet 0  . rosuvastatin (CRESTOR) 10 MG tablet 1 tablet    . tacrolimus (PROGRAF) 1 MG capsule Take 1 mg by mouth 2 (two) times daily.    Marland Kitchen Ubrogepant (UBRELVY) 50 MG TABS Take 50 mg by mouth every 2 (two) hours as needed. Max 200mg  a day 4 tablet 0   Current Facility-Administered  Medications on File Prior to Visit  Medication Dose Route Frequency Provider Last Rate Last Admin  . pneumococcal 23 valent vaccine (PNU-IMMUNE) injection 0.5 mL  0.5 mL Intramuscular Once , MD        Allergies  Allergen Reactions  . Morphine Other (See Comments)    Difficulty breathing  . Morphine And Related Shortness Of Breath  . Alphagan [Brimonidine]     Eyelid swelling, scratching  Allergic to preservative contained in this med  . Codeine Nausea And Vomiting  . Dorzolamide Hcl-Timolol Mal     Inflammation of the eyelid, scratching Allergic to preservative contained in this med  . Hydrocodone-Acetaminophen Nausea And Vomiting    But tolerates tylenol  . Sulfa Antibiotics Rash    "Large bumps"  . Sulfamethoxazole Rash    Assessment/Plan:  1. Hypertension -  BP above goal <130/80 mmHg. Medication adherence appears optimal. Encouraged patient to start checking BP at least 1 hour after medications and to bring log to next visit to better assess BP. Patient verbalized understanding. Continued amlodipine 10 mg daily, chlorthalidone 25 mg daily and losartan 100 mg daily. Encouraged patient to aim for a diet full of vegetables, fruit and lean meats (chicken, [place], fish) and to limit salt intake by eating fresh or frozen vegetables (instead of canned), rinse canned vegetables prior to cooking and do not add any additional salt to meals. Encouraged patient to exercise 20-30 minutes daily with the goal of 150 minutes per week. Patient verbalized understanding. Will check BMET today due to recent dose increase in losartan at last visit. Follow-up appointment scheduled in 1 month.  2. Hyperlipidemia - LDL above goal <70 mg/dL. Medication adherence appears optimal with ezetimibe 10 mg daily, however patient cannot recall which dose of rosuvastatin she is taking (10 vs 20 mg). Per Dr. Judd Gaudier note on 07/28/20, rosuvastatin was reduced from 40 mg to 20 mg daily due to a drug  interaction with Lefluonomide. daily. However, rosuvastatin 10 mg daily is currently active on medication list. Instructed patient to call us regarding which dose she is taking at home. Patient verbalized understanding. Additionally, discussed clinical benefits and possible side effects of PCSK9-inhibitors, and idenitified no drug-drug interactions with current medications listed on profile. Will check lipid panel today to determine need for PCSK9-inhibitor. Patient amendable with plan.  Addendum: 1. Hypertension - Patient called and reported current prescription bottle for losartan is actually 50 mg daily instead of 100 mg daily as listed on her AVS, and has been taking losartan as directed on the prescription bottle. Instructed patient to start taking losartan 100 mg daily as previously prescribed by Dr. Shari Prows. Resent prescription to pharmacy. Follow-up appointment rescheduled in 2 weeks for BP check and BMET.  2. Hyperlipidemia - Pt called and verified she is taking rosuvastatin 20 mg daily and not 10 mg daily as listed on medication profile. Sent updated prescription of rosuvastatin 20 mg daily to pharmacy.   Fabio Neighbors, PharmD, BCPS PGY2 Ambulatory Care Pharmacy Resident [facility] [address] [place], [place] [address] Phone: [phone]; Fax: [phone]

## 2020-10-28 ENCOUNTER — Encounter: Payer: Self-pay | Admitting: Pharmacist

## 2020-10-28 ENCOUNTER — Other Ambulatory Visit: Payer: Self-pay

## 2020-10-28 ENCOUNTER — Ambulatory Visit (INDEPENDENT_AMBULATORY_CARE_PROVIDER_SITE_OTHER): Payer: Medicare PPO | Admitting: Pharmacist

## 2020-10-28 VITALS — BP 136/82

## 2020-10-28 DIAGNOSIS — I10 Essential (primary) hypertension: Secondary | ICD-10-CM

## 2020-10-28 DIAGNOSIS — E785 Hyperlipidemia, unspecified: Secondary | ICD-10-CM

## 2020-10-28 DIAGNOSIS — E7800 Pure hypercholesterolemia, unspecified: Secondary | ICD-10-CM | POA: Diagnosis not present

## 2020-10-28 LAB — LIPID PANEL
Chol/HDL Ratio: 2.8 ratio (ref 0.0–4.4)
Cholesterol, Total: 196 mg/dL (ref 100–199)
HDL: 69 mg/dL (ref >39)
LDL Chol Calc (NIH): 110 mg/dL — ABNORMAL HIGH (ref 0–99)
Triglycerides: 96 mg/dL (ref 0–149)
VLDL Cholesterol Cal: 17 mg/dL (ref 5–40)

## 2020-10-28 LAB — BASIC METABOLIC PANEL
BUN/Creatinine Ratio: 21 (ref 12–28)
BUN: 16 mg/dL (ref 8–27)
CO2: 26 mmol/L (ref 20–29)
Calcium: 9.4 mg/dL (ref 8.7–10.3)
Chloride: 97 mmol/L (ref 96–106)
Creatinine, Ser: 0.78 mg/dL (ref 0.57–1.00)
Glucose: 101 mg/dL — ABNORMAL HIGH (ref 65–99)
Potassium: 4.4 mmol/L (ref 3.5–5.2)
Sodium: 139 mmol/L (ref 134–144)
eGFR: 83 mL/min/{1.73_m2} (ref >59)

## 2020-10-28 MED ORDER — ROSUVASTATIN CALCIUM 20 MG PO TABS: 20.0000 mg | ORAL_TABLET | Freq: Every day | ORAL | 3 refills | Status: DC

## 2020-10-28 MED ORDER — LOSARTAN POTASSIUM 100 MG PO TABS: 100.0000 mg | ORAL_TABLET | Freq: Every day | ORAL | 3 refills | Status: DC

## 2020-10-28 NOTE — Patient Instructions (Addendum)
Nice to see you today!  Keep up the good work with diet and exercise. Aim for a diet full of vegetables, fruit and lean meats (chicken, [place], fish). Try to limit carbs (bread, pasta, sugar, rice) and red meat consumption.  Medication Changes: Continue amlodipine 10 mg daily  Continue chlorthalidone 25 mg daily  Continue losartan 100 mg daily  Please give [place] a call at [phone] to determine which dose of "Rosuvstatin or Crestor" you are taking and also to provide your [facility] information or you can Mychart [place] at [employer].

## 2020-10-30 ENCOUNTER — Encounter: Payer: Self-pay | Admitting: Physician Assistant

## 2020-10-30 ENCOUNTER — Other Ambulatory Visit: Payer: Self-pay

## 2020-10-30 ENCOUNTER — Ambulatory Visit: Payer: Medicare PPO | Admitting: Physician Assistant

## 2020-10-30 VITALS — BP 137/82 | HR 76 | Resp 14 | Ht 62.0 in | Wt 119.0 lb

## 2020-10-30 DIAGNOSIS — H209 Unspecified iridocyclitis: Secondary | ICD-10-CM | POA: Diagnosis not present

## 2020-10-30 DIAGNOSIS — Z79899 Other long term (current) drug therapy: Secondary | ICD-10-CM

## 2020-10-30 DIAGNOSIS — J849 Interstitial pulmonary disease, unspecified: Secondary | ICD-10-CM

## 2020-10-30 DIAGNOSIS — M7061 Trochanteric bursitis, right hip: Secondary | ICD-10-CM

## 2020-10-30 DIAGNOSIS — Z87898 Personal history of other specified conditions: Secondary | ICD-10-CM

## 2020-10-30 DIAGNOSIS — M0579 Rheumatoid arthritis with rheumatoid factor of multiple sites without organ or systems involvement: Secondary | ICD-10-CM

## 2020-10-30 DIAGNOSIS — M5136 Other intervertebral disc degeneration, lumbar region: Secondary | ICD-10-CM | POA: Diagnosis not present

## 2020-10-30 DIAGNOSIS — Z9889 Other specified postprocedural states: Secondary | ICD-10-CM | POA: Diagnosis not present

## 2020-10-30 DIAGNOSIS — Z111 Encounter for screening for respiratory tuberculosis: Secondary | ICD-10-CM

## 2020-10-30 DIAGNOSIS — Z8669 Personal history of other diseases of the nervous system and sense organs: Secondary | ICD-10-CM

## 2020-10-30 DIAGNOSIS — M810 Age-related osteoporosis without current pathological fracture: Secondary | ICD-10-CM | POA: Diagnosis not present

## 2020-10-30 DIAGNOSIS — M7062 Trochanteric bursitis, left hip: Secondary | ICD-10-CM

## 2020-10-31 LAB — CBC WITH DIFFERENTIAL/PLATELET
Absolute Monocytes: 1206 cells/uL — ABNORMAL HIGH (ref 200–950)
Basophils Absolute: 90 cells/uL (ref 0–200)
Basophils Relative: 1 %
Eosinophils Absolute: 738 cells/uL — ABNORMAL HIGH (ref 15–500)
Eosinophils Relative: 8.2 %
HCT: 42.4 % (ref 35.0–45.0)
Hemoglobin: 13.9 g/dL (ref 11.7–15.5)
Lymphs Abs: 1224 cells/uL (ref 850–3900)
MCH: 29.5 pg (ref 27.0–33.0)
MCHC: 32.8 g/dL (ref 32.0–36.0)
MCV: 90 fL (ref 80.0–100.0)
MPV: 11.3 fL (ref 7.5–12.5)
Monocytes Relative: 13.4 %
Neutro Abs: 5742 cells/uL (ref 1500–7800)
Neutrophils Relative %: 63.8 %
Platelets: 290 10*3/uL (ref 140–400)
RBC: 4.71 10*6/uL (ref 3.80–5.10)
RDW: 12.5 % (ref 11.0–15.0)
Total Lymphocyte: 13.6 %
WBC: 9 10*3/uL (ref 3.8–10.8)

## 2020-10-31 LAB — HEPATIC FUNCTION PANEL
AG Ratio: 1.4 (calc) (ref 1.0–2.5)
ALT: 20 U/L (ref 6–29)
AST: 27 U/L (ref 10–35)
Albumin: 4.4 g/dL (ref 3.6–5.1)
Alkaline phosphatase (APISO): 93 U/L (ref 37–153)
Bilirubin, Direct: 0.1 mg/dL (ref 0.0–0.2)
Globulin: 3.2 g/dL (calc) (ref 1.9–3.7)
Indirect Bilirubin: 0.3 mg/dL (calc) (ref 0.2–1.2)
Total Bilirubin: 0.4 mg/dL (ref 0.2–1.2)
Total Protein: 7.6 g/dL (ref 6.1–8.1)

## 2020-11-03 NOTE — Progress Notes (Signed)
Absolute monocytes and absolute eosinophils are slightly elevated.  WBC count is WNL.  Rest of CBC WNL.  Hepatic function panel WNL.

## 2020-11-03 NOTE — Telephone Encounter (Signed)
Patient dropped off [employer] PAP application during OV visit with Sherron Ales on 10/30/20. We discussed in detail that [employer] has an appeal process wherein she can submit documentation of mdeical bills for her and her husbnad to illustrate that income is not reflective of cost of care. Patient plans to call [employer] and collect list of what documents/bills she can submit. Patient was provided draft of financial hardship letter to complete.  I called patient to collect annual income information but she will call me back later with this information because her husband is aware of this.  Chesley Mires, PharmD, MPH Clinical Pharmacist (Rheumatology and Pulmonology)

## 2020-11-05 NOTE — Telephone Encounter (Signed)
Called patient, left message to follow up on income info needed for PAP application.

## 2020-11-05 NOTE — Telephone Encounter (Signed)
Patient returned call and provided income info.  Submitted Patient Assistance Application to [facility] for [employer] along with provider portion. Will update patient when we receive a response.  Fax# [phone] Phone# [phone]

## 2020-11-07 ENCOUNTER — Other Ambulatory Visit: Payer: Self-pay | Admitting: Internal Medicine

## 2020-11-08 ENCOUNTER — Other Ambulatory Visit: Payer: Self-pay | Admitting: Internal Medicine

## 2020-11-10 NOTE — Progress Notes (Signed)
Patient ID: Brianna [name]                 DOB: 01-01-1951                      MRN: [id number]     HPI: Brianna [name] is a 69 y.o. female referred by Dr. Johney Frame to HTN clinic. PMH is significant for HTN, HLD, PVD, RA, migraines, arthritis, epilepsy and stroke. Pulmonary artery trunk noted to be enlarged on CT scan, indicative of possible underlying PH. Coronary artery calcifications also noted on CT scan. Pt last seen in HTN clinic on 10/28/20 and BP was 136/82. Pt reported taking losartan 50 mg daily instead of 100 mg daily as prescribed by Dr. Johney Frame. Pt instructed to start losartan 100 mg daily and to bring BP log to next visit.   Patient presents today in good spirits. Reports medication adherence with amlodipine 10 mg daily and losartan 100 mg daily. Reports she is unsure if she is taking chlorthalidone. Denies dizziness. Reports history of migraines/headaches and blurry vision secondary to increased ocular pressure and inflammation in eyes. Denies LE swelling. Reports only able to check BP about 3 times since our last visit due to everything being packed up for kitchen renovations. Reports BP 136/90 and 135/85. Additionally, reports medication adherence and no side effects with ezetimibe 10 mg daily and rosuvastatin 20 mg daily.  Current HTN meds: amlodipine 10 mg daily (AM), chlorthalidone 25 mg daily (AM), losartan 100 mg daily (AM) Previously tried: clonidine 0.1 mg BID (severe dry mouth, extreme fatigue, dizziness, and loss of appetite) BP goal: <130/80 mmHg  Current Lipid meds: ezetimibe 10 mg daily, rosuvastatin 20 mg daily Previously tried meds: rosuvastatin 40 mg daily (drug interaction with Lefluonomide) Risk factors: hx stroke, HTN, HLD, PVD LDL goal: <70 mg/dL  Family History: Father died from MI. Epilepsy in her sister; Heart disease in her father and mother; High Cholesterol in her brother, brother, brother, and sister; High blood pressure in her brother, brother, and  brother; Lung disease in her mother; Prostate cancer in her maternal grandfather; Rheum arthritis in her maternal grandmother and sister; Uterine cancer in her mother  Social History: Former Smoker (Quit: 07/19/1988 - 0.5 ppd x14 years)  Diet: Still adding some salt to diet. Down to 2-3 cups of coffee a day (previously 4 cups daily), down to 0.5-1 energy drink a day (previously 2 drinks daily). Eating chicken, sometimes hamburger, occasional steak. Needs to work on cutting down carbs and fats. Steak once a month. Tuna, salmon, pancakes, hamburgers, salads.  Does not eat breakfast  Exercise: walk with dog for 0.5 mile to 3-5 blocks, gardening, has not gone back to gym  Home BP readings: 136/90, 135/85  Labs: 10/28/20:   BMET: Scr 0.78, K 4.4, Na 139, eGFR 83  LDL 110, TG 96, TC 196, HDL 69 (ezetimibe 10 mg and rosuvastatin 20 mg daily)  Wt Readings from Last 3 Encounters:  10/30/20 119 lb (54 kg)  09/30/20 118 lb (53.5 kg)  08/28/20 119 lb 9.6 oz (54.3 kg)   BP Readings from Last 3 Encounters:  11/11/20 (!) 142/84  10/30/20 137/82  10/28/20 136/82   Pulse Readings from Last 3 Encounters:  10/30/20 76  09/30/20 76  08/28/20 75    Renal function: Estimated Creatinine Clearance: 53.2 mL/min (by C-G formula based on SCr of 0.78 mg/dL).  Past Medical History:  Diagnosis Date  . Arthritis    in the  neck  . Carotid artery occlusion   . Epilepsy ([place])   . GERD (gastroesophageal reflux disease)   . Glaucoma   . Hyperlipidemia   . Hypertension   . Keratoconus of both eyes 1981  . Macular degeneration   . Migraines   . Neuromuscular disorder ([facility])    RA  . Peripheral vascular disease ([place])    carotid occlusion surgery on left  . Raynaud's disease   . Retinal edema   . Rheumatoid arthritis(714.0)   . Stroke [facility])    two mild strokes presumed from left carotid stenosis    Current Outpatient Medications on File Prior to Visit  Medication Sig Dispense Refill  .  ACETAMINOPHEN-BUTALBITAL 50-325 MG TABS Take by mouth daily as needed.    Marland Kitchen amLODipine (NORVASC) 10 MG tablet Take 10 mg by mouth daily.    . Ascorbic Acid 500 MG CAPS Take by mouth.    Marland Kitchen aspirin 325 MG tablet Take 325 mg by mouth daily.    . botulinum toxin Type A (BOTOX) 100 units SOLR injection Inject IM into the head and neck muscles every 3 months by provider in office 2 vial 3  . butalbital-acetaminophen-caffeine (FIORICET, ESGIC) 50-325-40 MG tablet Take 1 tablet by mouth every 6 (six) hours as needed for headache. 10 tablet 4  . Calcium Carbonate-Vit D-Min (CALCIUM 1200 PO) Take 2 capsules by mouth daily.    . chlorthalidone (HYGROTON) 25 MG tablet Take 1 tablet (25 mg total) by mouth daily. 30 tablet 11  . Cholecalciferol (VITAMIN D PO) Take 1,000 Units by mouth daily.     . Difluprednate (DUREZOL) 0.05 % EMUL 5 drops in left eye and 2 in right eye daily.    Marland Kitchen DILANTIN 100 MG ER capsule TAKE 3 CAPSULES BY MOUTH DAILY 270 capsule 3  . ezetimibe (ZETIA) 10 MG tablet Take 10 mg by mouth daily.    . famotidine (PEPCID) 20 MG tablet TAKE 1 TABLET(20 MG) BY MOUTH AT BEDTIME 90 tablet 1  . HUMIRA PEN 40 MG/0.4ML PNKT Inject 40 mg into the skin every 14 (fourteen) days. As directed    . hypromellose (GENTEAL) 0.3 % GEL ophthalmic ointment as needed.    . leflunomide (ARAVA) 20 MG tablet Take 20 mg by mouth daily.    Marland Kitchen LORazepam (ATIVAN) 0.5 MG tablet Take 0.5 mg by mouth daily as needed for anxiety or sleep.    Marland Kitchen losartan (COZAAR) 100 MG tablet Take 1 tablet (100 mg total) by mouth daily. 90 tablet 3  . pantoprazole (PROTONIX) 40 MG tablet TAKE 1 TABLET(40 MG) BY MOUTH DAILY 90 tablet 3  . promethazine (PHENERGAN) 25 MG tablet Take 1 tablet (25 mg total) by mouth every 6 (six) hours as needed for nausea or vomiting. 30 tablet 11  . RESTASIS 0.05 % ophthalmic emulsion 2 (two) times daily.    . Rimegepant Sulfate (NURTEC) 75 MG TBDP Take 75 mg by mouth daily as needed. For migraines. Take as  close to onset of migraine as possible. One daily maximum. 4 tablet 0  . rosuvastatin (CRESTOR) 20 MG tablet Take 1 tablet (20 mg total) by mouth daily. 90 tablet 3  . tacrolimus (PROGRAF) 1 MG capsule Take 1 mg by mouth 2 (two) times daily.     Current Facility-Administered Medications on File Prior to Visit  Medication Dose Route Frequency Provider Last Rate Last Admin  . pneumococcal 23 valent vaccine (PNU-IMMUNE) injection 0.5 mL  0.5 mL Intramuscular Once Tanda Rockers,  MD        Allergies  Allergen Reactions  . Morphine Other (See Comments)    Difficulty breathing  . Morphine And Related Shortness Of Breath  . Alphagan [Brimonidine]     Eyelid swelling, scratching Allergic to preservative contained in this med  . Codeine Nausea And Vomiting  . Dorzolamide Hcl-Timolol Mal     Inflammation of the eyelid, scratching Allergic to preservative contained in this med  . Hydrocodone-Acetaminophen Nausea And Vomiting    But tolerates tylenol  . Sulfa Antibiotics Rash    "Large bumps"  . Sulfamethoxazole Rash     Assessment/Plan:  1. Hypertension - BP above goal <130/80 mmHg. Medication adherence appears optimal with amlodipine 10 mg daily and losartan 100 mg daily, however pt unsure if taking chlorthalidone 25 mg daily as prescribed. Instructed patient to call our office or send a mychart message to verify if taking chlorthalidone. Patient verbalized understanding. If taking chlorthalidone, discussed potentially adding spironolactone 12.5 mg daily pending BMET results for additional BP management. Patient verbalized understanding. Continued amlodipine 10 mg daily, losartan 100 mg daily, and chlorthalidone 25 mg daily. Will plan to schedule follow-up appointment and labs after speaking with patient.   2. Hyperlipidemia - LDL above goal <70 mg/dL. Medication adherence appears optimal with ezetimibe 10 mg daily and rosuvastatin 20 mg daily. Patient unable to increase rosuvastatin to 40 mg  daily due to drug interaction with Leflunomide. Previously discussed clinical benefits and possible side effects of PCSK9-inhibitors. Previously demonstrated proper injection technique with teach-back method. Identified no drug-drug interactions with current medications listed on profile. Will plan to start Repatha 140 mg [place] every 2 weeks pending PA approval and schedule follow-up fasting lipid panel and LFTs in 3 months. Continued rosuvastatin 20 mg daily and ezetimibe 10 mg daily.  Lorel Monaco, PharmD, [facility] [address], [place], [place] [address] Phone: [phone]; Fax: [phone]

## 2020-11-11 ENCOUNTER — Encounter: Payer: Self-pay | Admitting: Pharmacist

## 2020-11-11 ENCOUNTER — Ambulatory Visit (INDEPENDENT_AMBULATORY_CARE_PROVIDER_SITE_OTHER): Payer: Medicare PPO | Admitting: Pharmacist

## 2020-11-11 ENCOUNTER — Other Ambulatory Visit: Payer: Self-pay

## 2020-11-11 ENCOUNTER — Other Ambulatory Visit: Payer: Medicare PPO | Admitting: *Deleted

## 2020-11-11 VITALS — BP 142/84

## 2020-11-11 DIAGNOSIS — I2584 Coronary atherosclerosis due to calcified coronary lesion: Secondary | ICD-10-CM | POA: Diagnosis not present

## 2020-11-11 DIAGNOSIS — I2510 Atherosclerotic heart disease of native coronary artery without angina pectoris: Secondary | ICD-10-CM

## 2020-11-11 DIAGNOSIS — I10 Essential (primary) hypertension: Secondary | ICD-10-CM

## 2020-11-11 DIAGNOSIS — I700 Atherosclerosis of aorta: Secondary | ICD-10-CM

## 2020-11-11 DIAGNOSIS — E7800 Pure hypercholesterolemia, unspecified: Secondary | ICD-10-CM | POA: Diagnosis not present

## 2020-11-11 DIAGNOSIS — E785 Hyperlipidemia, unspecified: Secondary | ICD-10-CM

## 2020-11-11 NOTE — Patient Instructions (Addendum)
It was nice to see you today!  Your goal blood pressure is <130/80 mmHg.  Medication Changes:  Continue amlodipine 10 mg daily  Continue losartan 100 mg daily  Double check and see if you are taking Chlorthalidone 25 mg daily. Can give [place] a call or send a mychart message  Will plan to start a PCSK9-inhibitor (Praluent or Repatha depending on what your insurance covers)  For now, continue rosuvastatin 20 mg daily and ezetimibe 10 mg daily, and I will give you a call when the cholesterol shot is ready to be picked up from your pharmacy and will plan to discontinue ezetimibe.  Monitor blood pressure at home daily and keep a log (on your phone or piece of paper) to bring with you to your next visit. Write down date, time, blood pressure and pulse.  Keep up the good work with diet and exercise. Aim for a diet full of vegetables, fruit and lean meats (chicken, [place], fish). Try to limit salt intake by eating fresh or frozen vegetables (instead of canned), rinse canned vegetables prior to cooking and do not add any additional salt to meals.   Please give [place] a call at [phone] with any questions or concerns.

## 2020-11-12 ENCOUNTER — Telehealth: Payer: Self-pay | Admitting: Pharmacist

## 2020-11-12 DIAGNOSIS — I10 Essential (primary) hypertension: Secondary | ICD-10-CM

## 2020-11-12 DIAGNOSIS — E785 Hyperlipidemia, unspecified: Secondary | ICD-10-CM

## 2020-11-12 LAB — BASIC METABOLIC PANEL WITH GFR
BUN/Creatinine Ratio: 20 (ref 12–28)
BUN: 13 mg/dL (ref 8–27)
CO2: 26 mmol/L (ref 20–29)
Calcium: 9.6 mg/dL (ref 8.7–10.3)
Chloride: 95 mmol/L — ABNORMAL LOW (ref 96–106)
Creatinine, Ser: 0.65 mg/dL (ref 0.57–1.00)
Glucose: 81 mg/dL (ref 65–99)
Potassium: 4.1 mmol/L (ref 3.5–5.2)
Sodium: 138 mmol/L (ref 134–144)
eGFR: 96 mL/min/1.73

## 2020-11-12 MED ORDER — REPATHA SURECLICK 140 MG/ML ~~LOC~~ SOAJ: 1.0000 "pen " | SUBCUTANEOUS | 11 refills | Status: DC

## 2020-11-12 MED ORDER — SPIRONOLACTONE 25 MG PO TABS: 12.5000 mg | ORAL_TABLET | Freq: Every day | ORAL | 2 refills | Status: DC

## 2020-11-12 NOTE — Telephone Encounter (Signed)
Contacted patient regarding hypertension and hyperlipidemia management. Patient confirmed she is taking amlodipine 10 mg daily, losartan 100 mg daily, and chlorthalidone 25 mg daily as prescribed. She has discontinued clonidine. BMET wnl (Scr 0.65, K 4.1, Na 138), therefore will start spironolactone 12.5 mg daily as discussed at our visit yesterday for additional BP management. Patient verbalized understanding. Will monitor potassium as patient is also taking tacrolimus. Scheduled follow-up BMET in 2 weeks and HTN visit in 3 weeks.  Additionally, PA approved for Repatha 140 mg [place] every 2 weeks. Prescription sent to pharmacy with $27.03 copay. Patient verbalized agreement. Continued rosuvastatin 20 mg daily and ezetimibe 10 mg daily. Follow-up fasting lipid panel and LFTs scheduled in 3 months.

## 2020-11-12 NOTE — Telephone Encounter (Signed)
Agree with plan as documented.

## 2020-11-14 NOTE — Telephone Encounter (Signed)
Called patient to discuss. 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 Will contact office with any questions.

## 2020-11-14 NOTE — Telephone Encounter (Signed)
Received notification from [facility] for [employer] patient assistance, patient's application has been DENIED due to patient did not meet income guidelines.    Phone# [phone]  Patient received additional documents in office to complete in case she was denied for income. Will follow up with patient to discuss.

## 2020-11-18 DIAGNOSIS — H353221 Exudative age-related macular degeneration, left eye, with active choroidal neovascularization: Secondary | ICD-10-CM | POA: Diagnosis not present

## 2020-11-18 DIAGNOSIS — H401123 Primary open-angle glaucoma, left eye, severe stage: Secondary | ICD-10-CM | POA: Diagnosis not present

## 2020-11-18 DIAGNOSIS — Z947 Corneal transplant status: Secondary | ICD-10-CM | POA: Diagnosis not present

## 2020-11-18 DIAGNOSIS — H30033 Focal chorioretinal inflammation, peripheral, bilateral: Secondary | ICD-10-CM | POA: Diagnosis not present

## 2020-11-18 DIAGNOSIS — H401112 Primary open-angle glaucoma, right eye, moderate stage: Secondary | ICD-10-CM | POA: Diagnosis not present

## 2020-11-18 DIAGNOSIS — H3581 Retinal edema: Secondary | ICD-10-CM | POA: Diagnosis not present

## 2020-11-18 DIAGNOSIS — Z79899 Other long term (current) drug therapy: Secondary | ICD-10-CM | POA: Diagnosis not present

## 2020-11-18 DIAGNOSIS — H30102 Unspecified disseminated chorioretinal inflammation, left eye: Secondary | ICD-10-CM | POA: Diagnosis not present

## 2020-11-20 DIAGNOSIS — H6123 Impacted cerumen, bilateral: Secondary | ICD-10-CM | POA: Diagnosis not present

## 2020-11-21 ENCOUNTER — Telehealth: Payer: Self-pay | Admitting: Cardiology

## 2020-11-21 NOTE — Telephone Encounter (Signed)
Noted. Both rx sent in on 4/27, pt was delayed in picking them up. BMET 5/10 has already been canceled and will be checked at 5/17 HTN visit instead.

## 2020-11-21 NOTE — Telephone Encounter (Signed)
Pt is calling to let our office know that she received her new meds: Injectable and Spironolactone

## 2020-11-24 DIAGNOSIS — H401112 Primary open-angle glaucoma, right eye, moderate stage: Secondary | ICD-10-CM | POA: Diagnosis not present

## 2020-11-24 DIAGNOSIS — Z79899 Other long term (current) drug therapy: Secondary | ICD-10-CM | POA: Diagnosis not present

## 2020-11-24 DIAGNOSIS — H3321 Serous retinal detachment, right eye: Secondary | ICD-10-CM | POA: Diagnosis not present

## 2020-11-24 DIAGNOSIS — Z947 Corneal transplant status: Secondary | ICD-10-CM | POA: Diagnosis not present

## 2020-11-24 DIAGNOSIS — Z961 Presence of intraocular lens: Secondary | ICD-10-CM | POA: Diagnosis not present

## 2020-11-24 DIAGNOSIS — Z9841 Cataract extraction status, right eye: Secondary | ICD-10-CM | POA: Diagnosis not present

## 2020-11-24 DIAGNOSIS — H401123 Primary open-angle glaucoma, left eye, severe stage: Secondary | ICD-10-CM | POA: Diagnosis not present

## 2020-11-25 ENCOUNTER — Telehealth: Payer: Self-pay

## 2020-11-25 ENCOUNTER — Other Ambulatory Visit: Payer: Medicare PPO

## 2020-11-25 DIAGNOSIS — M0579 Rheumatoid arthritis with rheumatoid factor of multiple sites without organ or systems involvement: Secondary | ICD-10-CM

## 2020-11-25 DIAGNOSIS — J849 Interstitial pulmonary disease, unspecified: Secondary | ICD-10-CM

## 2020-11-25 DIAGNOSIS — Z20822 Contact with and (suspected) exposure to covid-19: Secondary | ICD-10-CM | POA: Diagnosis not present

## 2020-11-25 DIAGNOSIS — Z79899 Other long term (current) drug therapy: Secondary | ICD-10-CM

## 2020-11-25 DIAGNOSIS — H209 Unspecified iridocyclitis: Secondary | ICD-10-CM

## 2020-11-25 DIAGNOSIS — J8410 Pulmonary fibrosis, unspecified: Secondary | ICD-10-CM

## 2020-11-25 DIAGNOSIS — I2721 Secondary pulmonary arterial hypertension: Secondary | ICD-10-CM

## 2020-11-25 DIAGNOSIS — Z87898 Personal history of other specified conditions: Secondary | ICD-10-CM

## 2020-11-25 NOTE — Telephone Encounter (Signed)
I called patient, patient verbalized understanding, referral placed for infusion.

## 2020-11-25 NOTE — Telephone Encounter (Signed)
Patient called stating she took a Rapid Covid test last night and it was positive.  Patient states her symptoms include headache, chills, sore throat, chest congestion, fatigue, and body ache.  Patient states she is going to [facility] to take the 3 day test this morning.  Patient states Dr. Corliss Skains wanted her to contact the office if she ever tested positive due to being on Humira so she could refer her for infusion.  Patient requested a return call.

## 2020-11-25 NOTE — Telephone Encounter (Signed)
Humira 40 mg sq every 14 days, please advise.

## 2020-11-25 NOTE — Telephone Encounter (Signed)
Patient should hold Humira.  We should refer her for monoclonal antibody infusion.  She may also call her PCP for treatment options.

## 2020-11-26 ENCOUNTER — Telehealth: Payer: Self-pay | Admitting: Unknown Physician Specialty

## 2020-11-26 ENCOUNTER — Telehealth: Payer: Self-pay

## 2020-11-26 NOTE — Telephone Encounter (Signed)
Called to discuss with patient about COVID-19 symptoms and the use of one of the available treatments for those with mild to moderate Covid symptoms and at a high risk of hospitalization.  Pt appears to qualify for outpatient treatment due to co-morbid conditions and/or a member of an at-risk group in accordance with the FDA Emergency Use Authorization.    Symptom onset: 11/23/20 Cough,sore throat, headache,decreased appetite Vaccinated: Yes Booster? Yes Immunocompromised?  Qualifiers: RA, Lang Snow NIH Criteria: Tier 1   Pt. Would like to speak with APP.  Esther Hardy

## 2020-11-26 NOTE — Telephone Encounter (Signed)
I connected by phone with Brianna [name] on 11/26/2020 at 10:43 AM to discuss the potential use of a new treatment for mild to moderate COVID-19 viral infection in non-hospitalized patients.  This patient is a 70 y.o. female that meets the FDA criteria for Emergency Use Authorization of COVID monoclonal antibody bebtelovimab.  Has a (+) direct SARS-CoV-2 viral test result  Has mild or moderate COVID-19   Is NOT hospitalized due to COVID-19  Is within 10 days of symptom onset  Has at least one of the high risk factor(s) for progression to severe COVID-19 and/or hospitalization as defined in EUA.  Specific high risk criteria : Older age (>/= 68 yo) and Immunosuppressive Disease or Treatment   I have spoken and communicated the following to the patient or parent/caregiver regarding COVID monoclonal antibody treatment:  1. FDA has authorized the emergency use for the treatment of mild to moderate COVID-19 in adults and pediatric patients with positive results of direct SARS-CoV-2 viral testing who are 38 years of age and older weighing at least 40 kg, and who are at high risk for progressing to severe COVID-19 and/or hospitalization.  2. The significant known and potential risks and benefits of COVID monoclonal antibody, and the extent to which such potential risks and benefits are unknown.  3. Information on available alternative treatments and the risks and benefits of those alternatives, including clinical trials.  4. Patients treated with COVID monoclonal antibody should continue to self-isolate and use infection control measures (e.g., wear mask, isolate, social distance, avoid sharing personal items, clean and disinfect "high touch" surfaces, and frequent handwashing) according to CDC guidelines.   5. The patient or parent/caregiver has the option to accept or refuse COVID monoclonal antibody treatment.  6. Discussion about the monoclonal antibody infusion does not ensure treatment.  The patient will be placed on a list and scheduled according to risk, symptom onset and availability. A scheduler will reach to the patient to let them know if we can accommodate their infusion or not.  After reviewing this information with the patient, the patient has agreed to receive one of the available covid 19 monoclonal antibodies and will be provided an appropriate fact sheet prior to infusion. Gabriel Cirri, NP 11/26/2020 10:43 AM Sx onset 5/7

## 2020-11-28 ENCOUNTER — Ambulatory Visit (INDEPENDENT_AMBULATORY_CARE_PROVIDER_SITE_OTHER): Payer: Medicare PPO

## 2020-11-28 ENCOUNTER — Other Ambulatory Visit: Payer: Self-pay | Admitting: Physician Assistant

## 2020-11-28 DIAGNOSIS — U071 COVID-19: Secondary | ICD-10-CM

## 2020-11-28 DIAGNOSIS — M0579 Rheumatoid arthritis with rheumatoid factor of multiple sites without organ or systems involvement: Secondary | ICD-10-CM

## 2020-11-28 DIAGNOSIS — J8410 Pulmonary fibrosis, unspecified: Secondary | ICD-10-CM

## 2020-11-28 MED ORDER — ALBUTEROL SULFATE HFA 108 (90 BASE) MCG/ACT IN AERS: 2.0000 | INHALATION_SPRAY | Freq: Once | RESPIRATORY_TRACT | Status: AC | PRN

## 2020-11-28 MED ORDER — METHYLPREDNISOLONE SODIUM SUCC 125 MG IJ SOLR
125.0000 mg | Freq: Once | INTRAMUSCULAR | Status: AC | PRN
Start: 2020-11-28 — End: 2020-11-28

## 2020-11-28 MED ORDER — BEBTELOVIMAB 175 MG/2 ML IV (EUA)
175.0000 mg | Freq: Once | INTRAMUSCULAR | Status: AC
  Administered 2020-11-28: 175 mg via INTRAVENOUS

## 2020-11-28 MED ORDER — FAMOTIDINE IN NACL 20-0.9 MG/50ML-% IV SOLN: 20.0000 mg | Freq: Once | INTRAVENOUS | Status: AC | PRN

## 2020-11-28 MED ORDER — EPINEPHRINE 0.3 MG/0.3ML IJ SOAJ: 0.3000 mg | Freq: Once | INTRAMUSCULAR | Status: AC | PRN

## 2020-11-28 MED ORDER — DIPHENHYDRAMINE HCL 50 MG/ML IJ SOLN
50.0000 mg | Freq: Once | INTRAMUSCULAR | Status: AC | PRN
Start: 2020-11-28 — End: 2020-11-28

## 2020-11-28 MED ORDER — SODIUM CHLORIDE 0.9 % IV SOLN
INTRAVENOUS | Status: AC | PRN
Start: 2020-11-28 — End: ?

## 2020-11-28 NOTE — Patient Instructions (Signed)
10 Things You Can Do to Manage Your COVID-19 Symptoms at Home If you have possible or confirmed COVID-19: 1. Stay home except to get medical care. 2. Monitor your symptoms carefully. If your symptoms get worse, call your healthcare provider immediately. 3. Get rest and stay hydrated. 4. If you have a medical appointment, call the healthcare provider ahead of time and tell them that you have or may have COVID-19. 5. For medical emergencies, call 911 and notify the dispatch personnel that you have or may have COVID-19. 6. Cover your cough and sneezes with a tissue or use the inside of your elbow. 7. Wash your hands often with soap and water for at least 20 seconds or clean your hands with an alcohol-based hand sanitizer that contains at least 60% alcohol. 8. As much as possible, stay in a specific room and away from other people in your home. Also, you should use a separate bathroom, if available. If you need to be around other people in or outside of the home, wear a mask. 9. Avoid sharing personal items with other people in your household, like dishes, towels, and bedding. 10. Clean all surfaces that are touched often, like counters, tabletops, and doorknobs. Use household cleaning sprays or wipes according to the label instructions. cdc.gov/coronavirus 02/01/2020 This information is not intended to replace advice given to you by your health care provider. Make sure you discuss any questions you have with your health care provider. Document Revised: 05/19/2020 Document Reviewed: 05/19/2020 Elsevier Patient Education  2021 Elsevier Inc.  What types of side effects do monoclonal antibody drugs cause?  Common side effects  In general, the more common side effects caused by monoclonal antibody drugs include: . Allergic reactions, such as hives or itching . Flu-like signs and symptoms, including chills, fatigue, fever, and muscle aches and pains . Nausea, vomiting . Diarrhea . Skin  rashes . Low blood pressure   The CDC is recommending patients who receive monoclonal antibody treatments wait at least 90 days before being vaccinated.  Currently, there are no data on the safety and efficacy of mRNA COVID-19 vaccines in persons who received monoclonal antibodies or convalescent plasma as part of COVID-19 treatment. Based on the estimated half-life of such therapies as well as evidence suggesting that reinfection is uncommon in the 90 days after initial infection, vaccination should be deferred for at least 90 days, as a precautionary measure until additional information becomes available, to avoid interference of the antibody treatment with vaccine-induced immune responses.   If someone you know is interested in receiving treatment please have them contact their MD for a referral or visit [email or web address]    

## 2020-11-28 NOTE — Progress Notes (Signed)
Diagnosis: Covid  Provider:  Chilton Greathouse, MD  Procedure: Infusion  IV Type: Peripheral, IV Location: L Antecubital  Bebtelovimab, Dose: 175 mg  Infusion Start Time: 1405  Infusion Stop Time: 1406  Post Infusion IV Care: Observation period completed and Peripheral IV Discontinued  Discharge: Condition: Good, Destination: Home . AVS provided to patient.   Performed by:  Essie Hart, RN

## 2020-12-01 ENCOUNTER — Other Ambulatory Visit: Payer: Self-pay

## 2020-12-01 ENCOUNTER — Ambulatory Visit: Payer: Medicare PPO | Admitting: Neurology

## 2020-12-01 DIAGNOSIS — G43719 Chronic migraine without aura, intractable, without status migrainosus: Secondary | ICD-10-CM | POA: Diagnosis not present

## 2020-12-01 DIAGNOSIS — R519 Headache, unspecified: Secondary | ICD-10-CM

## 2020-12-01 DIAGNOSIS — I639 Cerebral infarction, unspecified: Secondary | ICD-10-CM

## 2020-12-01 DIAGNOSIS — R400 Somnolence: Secondary | ICD-10-CM

## 2020-12-01 MED ORDER — SUMATRIPTAN SUCCINATE 6 MG/0.5ML ~~LOC~~ SOLN: 6.0000 mg | SUBCUTANEOUS | 11 refills | Status: DC | PRN

## 2020-12-01 NOTE — Progress Notes (Signed)
Interval history 12/01/2020:   Send to Dr. Vickey Huger, intractable headches, morning headaches: Intractable headaches, wakes with headaches, has headaches when she wakes in the morning, hx of strokes: sleep evaluation  Imitrex injections: Patient has intractable chronic migraines, the best thing that is ever worked for her is Imitrex injections, we have tried multiple alternative medications including analgesics, Nurtec, Ubrelvy, Reyvow, and multiple other for second and third line acute management medications.  Patient had stroke, at that time we took her off her Imitrex, today she implore only request to have it back, she understands the risks, we did review the risks and discussed that sometimes life is about weighing the benefits versus those risks, at this time I will prescribe them for her, she is fully aware of the risk that she is taking with a history of vascular disease.  Meds ordered this encounter  Medications  . SUMAtriptan (IMITREX) 6 MG/0.5ML SOLN injection    Sig: Inject 0.5 mLs (6 mg total) into the skin every 2 (two) hours as needed for migraine or headache. Take one dose at headache onset, can take additional dose 2hrs later if needed. No more then 2 injections in 24hrs    Dispense:  6 mL    Refill:  11    autoinjector if available   Orders Placed This Encounter  Procedures  . Ambulatory referral to Sleep Studies      Interval history 02/12/2020: Migraines fluctuate with her eye problems, she sees cornea specialists and retina specialists and they may install long term steroid release long term in the eyes. Las botox she had only 4 migraine days a month taken care of by imitrex, significant.  >70% improvement in frequency of migraines, Baseline 15 severemigraine days a month and daily headaches. No masseters.  She absolutely loved  .Dry needling: cervical myofascial pain, Send back to dry needling - [address] for cervical myofascial pain syndrome. Gave samples for Nurtec  for her to try as well acutely at last visit but she did not like it.  Tried: VPA, Topamax, antidepressants, norvasc, phenergan, frova, aimovig, ajovy, fioricet, (save 6 As), verapamil     Consent Form Botulism Toxin Injection For Chronic Migraine    Reviewed orally with patient, additionally signature is on file:  Botulism toxin has been approved by the Federal drug administration for treatment of chronic migraine. Botulism toxin does not cure chronic migraine and it may not be effective in some patients.  The administration of botulism toxin is accomplished by injecting a small amount of toxin into the muscles of the neck and head. Dosage must be titrated for each individual. Any benefits resulting from botulism toxin tend to wear off after 3 months with a repeat injection required if benefit is to be maintained. Injections are usually done every 3-4 months with maximum effect peak achieved by about 2 or 3 weeks. Botulism toxin is expensive and you should be sure of what costs you will incur resulting from the injection.  The side effects of botulism toxin use for chronic migraine may include:   -Transient, and usually mild, facial weakness with facial injections  -Transient, and usually mild, head or neck weakness with head/neck injections  -Reduction or loss of forehead facial animation due to forehead muscle weakness  -Eyelid drooping  -Dry eye  -Pain at the site of injection or bruising at the site of injection  -Double vision  -Potential unknown long term risks  Contraindications: You should not have Botox if you are pregnant, nursing,  allergic to albumin, have an infection, skin condition, or muscle weakness at the site of the injection, or have myasthenia gravis, Lambert-Eaton syndrome, or ALS.  It is also possible that as with any injection, there may be an allergic reaction or no effect from the medication. Reduced effectiveness after repeated injections is sometimes seen  and rarely infection at the injection site may occur. All care will be taken to prevent these side effects. If therapy is given over a long time, atrophy and wasting in the muscle injected may occur. Occasionally the patient's become refractory to treatment because they develop antibodies to the toxin. In this event, therapy needs to be modified.  I have read the above information and consent to the administration of botulism toxin.    BOTOX PROCEDURE NOTE FOR MIGRAINE HEADACHE    Contraindications and precautions discussed with patient(above). Aseptic procedure was observed and patient tolerated procedure. Procedure performed by Dr. Artemio Aly  The condition has existed for more than 6 months, and pt does not have a diagnosis of ALS, Myasthenia Gravis or Lambert-Eaton Syndrome.  Risks and benefits of injections discussed and pt agrees to proceed with the procedure.  Written consent obtained  These injections are medically necessary. Pt  receives good benefits from these injections. These injections do not cause sedations or hallucinations which the oral therapies may cause.  Description of procedure:  The patient was placed in a sitting position. The standard protocol was used for Botox as follows, with 5 units of Botox injected at each site:   -Procerus muscle, midline injection DID NOT PERFORM  -Corrugator muscle, bilateral injection DID NOT PERFORM  -Frontalis muscle, bilateral injection, with 2 sites each side, medial injection was performed in the upper one third of the frontalis muscle, in the region vertical from the medial inferior edge of the superior orbital rim. The lateral injection was again in the upper one third of the forehead vertically above the lateral limbus of the cornea, 1.5 cm lateral to the medial injection site.  -Temporalis muscle injection, 4 sites, bilaterally. The first injection was 3 cm above the tragus of the ear, second injection site was 1.5 cm to 3 cm up  from the first injection site in line with the tragus of the ear. The third injection site was 1.5-3 cm forward between the first 2 injection sites. The fourth injection site was 1.5 cm posterior to the second injection site.   -Occipitalis muscle injection, 3 sites, bilaterally. The first injection was done one half way between the occipital protuberance and the tip of the mastoid process behind the ear. The second injection site was done lateral and superior to the first, 1 fingerbreadth from the first injection. The third injection site was 1 fingerbreadth superiorly and medially from the first injection site.  -Cervical paraspinal muscle injection, 2 sites, bilateral knee first injection site was 1 cm from the midline of the cervical spine, 3 cm inferior to the lower border of the occipital protuberance. The second injection site was 1.5 cm superiorly and laterally to the first injection site.  -Trapezius muscle injection was performed at 3 sites, bilaterally. The first injection site was in the upper trapezius muscle halfway between the inflection point of the neck, and the acromion. The second injection site was one half way between the acromion and the first injection site. The third injection was done between the first injection site and the inflection point of the neck.   Will return for repeat injection in 3  months.   200 units of Botox was used, any Botox not injected was wasted. The patient tolerated the procedure well, there were no complications of the above procedure.

## 2020-12-01 NOTE — Progress Notes (Signed)
Botox- 200 units x 1 vial Lot: [id number] Expiration: 07/2023 NDC: 7505-1833-58  Bacteriostatic 0.9% Sodium Chloride- 34mL total Lot: [id number] Expiration: 12/17/2021 NDC: 4210-3128-11  Dx: W86.773 B/B

## 2020-12-02 ENCOUNTER — Ambulatory Visit: Payer: Medicare PPO

## 2020-12-02 ENCOUNTER — Ambulatory Visit: Payer: Medicare PPO | Admitting: Neurology

## 2020-12-03 ENCOUNTER — Telehealth: Payer: Self-pay

## 2020-12-03 DIAGNOSIS — E785 Hyperlipidemia, unspecified: Secondary | ICD-10-CM

## 2020-12-03 MED ORDER — REPATHA SURECLICK 140 MG/ML ~~LOC~~ SOAJ: 1.0000 "pen " | SUBCUTANEOUS | 11 refills | Status: DC

## 2020-12-03 NOTE — Telephone Encounter (Signed)
Called to let the pt know that the repatha was APPROVED, RX SENT, PT INSTRUCTED TO CALL BACK IF COSTLY AND TO SCHEDULE FASTING LIPID

## 2020-12-04 ENCOUNTER — Telehealth: Payer: Self-pay

## 2020-12-04 NOTE — Telephone Encounter (Signed)
Patient called stating she is due to take her Humira injection tomorrow 12/06/19 and no longer has any Covid symptoms.  Patient requested a return call to let her know if it is okay to restart.

## 2020-12-04 NOTE — Telephone Encounter (Signed)
Please advise her to wait another week to see if the cough improves or progresses.  If cough continues to improve then she can have Humira shot.

## 2020-12-05 NOTE — Telephone Encounter (Signed)
I called patient, patient verbalized understanding. 

## 2020-12-12 NOTE — Telephone Encounter (Signed)
Called and spoke to patient, she is still in process of gathering documents.

## 2020-12-14 NOTE — Progress Notes (Signed)
Patient ID: DARBI CHANDRAN                 DOB: [birth year]                      MRN: [id number]     HPI: Diksha Tagliaferro Harperis a 70 y.o.femalereferred by Dr. Francesca Oman HTN clinic. PMH is significant for HTN, HLD, PVD, RA, migraines, arthritis,epilepsyand stroke. Pulmonary artery trunk noted to be enlarged on CT scan, indicative of possible underlying PH. Coronary artery calcifications also noted on CT scan. Pt last seen in HTN clinic on 11/11/20 and BP was elevated at 142/84. On 11/12/20, pt confirmed taking amlodipine 10 mg daily, losartan 100 mg daily, and chlorthalidone 25 mg daily as prescribed. BMET wnl, therefore spironolactone 12.5 mg daily was initiated for additional BP management (but actually started on 11/20/20).   Additionally, patient visited Dr. Jaynee Eagles (neurology) on 12/01/20 for chronic migraine management. Patient reported previous Imitrex (sumatriptin) injections was the best thing that worked for her migraines. She has tried multiple alternative medications including analgesics, Nurtec, Ubrelvy, Reyvow, and other multiple second and third line acute management medications with minimal relief. Dr. Jaynee Eagles noted Imitirex was discontinued when patient had a stroke. However, pt requested to restart Imitrex for migraine relief as other agents were not helping. Dr. Jaynee Eagles reviewed the cardiovascular risks given history of vascular disease (stroke and CAD), and documented patient understood the risks versus benefits. Ultimately, pt was restarted on Imitrex PRN for migraine management.  Patient presents today in good spirits. Reportsmedication adherence with amlodipine 10 mg daily, chlorthalidone 25 mg daily, and losartan 100 mg daily. Reports running out of spironolactone 12.5 mg ~4 days ago (Friday 12/12/20). Pt continues to report history of migraines/headaches and blurry vision secondary to increased ocular pressure and inflammation in eyes. Denies dizziness and LE swelling. Reports noticing a  decrease in weight "pants falling off." Reports average weight ~117 lbs (previously 121 lbs in January 2022). Reports low appetite recently and more frequent migraines which also causes low appetite. Reports sleep study in July to assess sleeping patterns. Reports not frequently checking BP at home, but has been "higher than normal" - has been consumed with kitchen renovations, daughter visiting, and BP monitor packed in boxes. Additionally, reports issues with Repatha pen malfunctioning and plans to visit pharmacy for replacement pen.   Current HTN meds:spironolactone 12.5 mg daily, amlodipine 10 mg daily(AM), chlorthalidone 25 mg daily(AM), losartan 100 mg daily(AM) Previously tried:clonidine 0.1 mg BID (severe dry mouth, extreme fatigue, dizziness, and loss of appetite) BP goal:<130/80 mmHg  Family History: Father died from MI. Epilepsy in her sister; Heart disease in her father and mother; High Cholesterol in her brother, brother, brother, and sister; High blood pressure in her brother, brother, and brother; Lung disease in her mother; Prostate cancer in her maternal grandfather; Rheum arthritis in her maternal grandmother and sister; Uterine cancer in her mother  Social History:Former Smoker (Quit: 07/19/1988- 0.5 ppd x14 years)  Diet:Still adding some salt to diet. Down to 2-3 cups of coffeea day (previously 4 cups daily),down to 0.5-1energydrink a day (previously 2 drinks daily).Eating chicken, sometimes hamburger, occasional steak. Needsto work on cutting down carbs and fats.Steak once a month. Tuna, salmon, pancakes, hamburgers, salads. Does not eat breakfast   Exercise:walk with dog for 0.5 mile to 3-5 blocks, gardening, has notgoneback to gym  Home BP readings: none   Labs: 11/11/20: Scr 0.65, K 4.1, Na 138, eGFR 96 10/28/20: Scr 0.78, K  4.4, Na 139, eGFR 83  Wt Readings from Last 3 Encounters:  12/16/20 114 lb 12.8 oz (52.1 kg)  10/30/20 119 lb (54 kg)  09/30/20  118 lb (53.5 kg)   BP Readings from Last 3 Encounters:  12/16/20 (!) 150/80  11/28/20 (!) 147/82  11/11/20 (!) 142/84   Pulse Readings from Last 3 Encounters:  12/16/20 63  11/28/20 77  10/30/20 76    Renal function: CrCl cannot be calculated (Patient's most recent lab result is older than the maximum 21 days allowed.).  Past Medical History:  Diagnosis Date  . Arthritis    in the neck  . Carotid artery occlusion   . Epilepsy ([place])   . GERD (gastroesophageal reflux disease)   . Glaucoma   . Hyperlipidemia   . Hypertension   . Keratoconus of both eyes 1981  . Macular degeneration   . Migraines   . Neuromuscular disorder ([facility])    RA  . Peripheral vascular disease ([place])    carotid occlusion surgery on left  . Raynaud's disease   . Retinal edema   . Rheumatoid arthritis(714.0)   . Stroke [facility])    two mild strokes presumed from left carotid stenosis    Current Outpatient Medications on File Prior to Visit  Medication Sig Dispense Refill  . ACETAMINOPHEN-BUTALBITAL 50-325 MG TABS Take by mouth daily as needed.    Marland Kitchen amLODipine (NORVASC) 10 MG tablet Take 10 mg by mouth daily.    . Ascorbic Acid 500 MG CAPS Take by mouth.    Marland Kitchen aspirin 325 MG tablet Take 325 mg by mouth daily.    . botulinum toxin Type A (BOTOX) 100 units SOLR injection Inject IM into the head and neck muscles every 3 months by provider in office 2 vial 3  . butalbital-acetaminophen-caffeine (FIORICET, ESGIC) 50-325-40 MG tablet Take 1 tablet by mouth every 6 (six) hours as needed for headache. 10 tablet 4  . Calcium Carbonate-Vit D-Min (CALCIUM 1200 PO) Take 2 capsules by mouth daily.    . chlorthalidone (HYGROTON) 25 MG tablet Take 1 tablet (25 mg total) by mouth daily. 30 tablet 11  . Cholecalciferol (VITAMIN D PO) Take 1,000 Units by mouth daily.     . Difluprednate (DUREZOL) 0.05 % EMUL 5 drops in left eye and 2 in right eye daily.    Marland Kitchen DILANTIN 100 MG ER capsule TAKE 3 CAPSULES BY MOUTH DAILY 270  capsule 3  . famotidine (PEPCID) 20 MG tablet TAKE 1 TABLET(20 MG) BY MOUTH AT BEDTIME 90 tablet 1  . HUMIRA PEN 40 MG/0.4ML PNKT Inject 40 mg into the skin every 14 (fourteen) days. As directed    . hypromellose (GENTEAL) 0.3 % GEL ophthalmic ointment as needed.    . leflunomide (ARAVA) 20 MG tablet Take 20 mg by mouth daily.    Marland Kitchen LORazepam (ATIVAN) 0.5 MG tablet Take 0.5 mg by mouth daily as needed for anxiety or sleep.    Marland Kitchen losartan (COZAAR) 100 MG tablet Take 1 tablet (100 mg total) by mouth daily. 90 tablet 3  . pantoprazole (PROTONIX) 40 MG tablet TAKE 1 TABLET(40 MG) BY MOUTH DAILY 90 tablet 3  . promethazine (PHENERGAN) 25 MG tablet Take 1 tablet (25 mg total) by mouth every 6 (six) hours as needed for nausea or vomiting. 30 tablet 11  . RESTASIS 0.05 % ophthalmic emulsion 2 (two) times daily.    . Rimegepant Sulfate (NURTEC) 75 MG TBDP Take 75 mg by mouth daily as needed. For  migraines. Take as close to onset of migraine as possible. One daily maximum. 4 tablet 0  . rosuvastatin (CRESTOR) 20 MG tablet Take 1 tablet (20 mg total) by mouth daily. 90 tablet 3  . SUMAtriptan (IMITREX) 6 MG/0.5ML SOLN injection Inject 0.5 mLs (6 mg total) into the skin every 2 (two) hours as needed for migraine or headache. Take one dose at headache onset, can take additional dose 2hrs later if needed. No more then 2 injections in 24hrs 6 mL 11  . tacrolimus (PROGRAF) 1 MG capsule Take 1 mg by mouth 2 (two) times daily.     Current Facility-Administered Medications on File Prior to Visit  Medication Dose Route Frequency Provider Last Rate Last Admin  . 0.9 %  sodium chloride infusion   Intravenous PRN Eileen Stanford, PA-C      . pneumococcal 23 valent vaccine (PNU-IMMUNE) injection 0.5 mL  0.5 mL Intramuscular Once Tanda Rockers, MD        Allergies  Allergen Reactions  . Morphine Other (See Comments)    Difficulty breathing  . Morphine And Related Shortness Of Breath  . Alphagan [Brimonidine]      Eyelid swelling, scratching Allergic to preservative contained in this med  . Codeine Nausea And Vomiting  . Dorzolamide Hcl-Timolol Mal     Inflammation of the eyelid, scratching Allergic to preservative contained in this med  . Hydrocodone-Acetaminophen Nausea And Vomiting    But tolerates tylenol  . Sulfa Antibiotics Rash    "Large bumps"  . Sulfamethoxazole Rash     Assessment/Plan:  1. Hypertension - Clinic BP continues to be above goal <130/80 mmHg. Medication adherence appears optimal with amlodipine 10 mg daily, chlorthalidone 25 mg daily and losartan 100 mg daily, however pt has been out of spironolactone 12.5 mg for ~4 days now. Extensively discussed the importance of checking BP at home given inconsistency with medication compliance from previous visits and recent addition of Imitrex (has not used yet). Encouraged patient to check BP daily at least 1 hour after medications and to bring log to the next visit. Patient verbalized understanding. Continued spironolactone 12.5 mg daily, amlodipine 10 mg daily, chlorthalidone 25 mg daily, and losartan 100 mg daily. Will likely need to increase spironolactone to 25 mg daily at next visit if BP remains elevated while taking all BP medications. Will check BMET today given recent spironolactone initiation. Follow-up appointment scheduled in 2 weeks.  Lorel Monaco, PharmD, [facility] [address], [place], [place] [address] Phone: [phone]; Fax: [phone]

## 2020-12-16 ENCOUNTER — Other Ambulatory Visit (HOSPITAL_COMMUNITY): Payer: Self-pay

## 2020-12-16 ENCOUNTER — Encounter: Payer: Self-pay | Admitting: Pharmacist

## 2020-12-16 ENCOUNTER — Other Ambulatory Visit: Payer: Self-pay

## 2020-12-16 ENCOUNTER — Ambulatory Visit (INDEPENDENT_AMBULATORY_CARE_PROVIDER_SITE_OTHER): Payer: Medicare PPO | Admitting: Pharmacist

## 2020-12-16 VITALS — BP 150/80 | HR 63 | Wt 114.8 lb

## 2020-12-16 DIAGNOSIS — I10 Essential (primary) hypertension: Secondary | ICD-10-CM

## 2020-12-16 DIAGNOSIS — E785 Hyperlipidemia, unspecified: Secondary | ICD-10-CM | POA: Diagnosis not present

## 2020-12-16 LAB — BASIC METABOLIC PANEL
BUN/Creatinine Ratio: 19 (ref 12–28)
BUN: 14 mg/dL (ref 8–27)
CO2: 27 mmol/L (ref 20–29)
Calcium: 9.4 mg/dL (ref 8.7–10.3)
Chloride: 98 mmol/L (ref 96–106)
Creatinine, Ser: 0.74 mg/dL (ref 0.57–1.00)
Glucose: 99 mg/dL (ref 65–99)
Potassium: 4.3 mmol/L (ref 3.5–5.2)
Sodium: 140 mmol/L (ref 134–144)
eGFR: 88 mL/min/{1.73_m2} (ref >59)

## 2020-12-16 MED ORDER — REPATHA SURECLICK 140 MG/ML ~~LOC~~ SOAJ
1.0000 "pen " | SUBCUTANEOUS | 11 refills | Status: DC
  Filled 2020-12-16: qty 2, 28d supply, fill #0

## 2020-12-16 MED ORDER — EZETIMIBE 10 MG PO TABS: 10.0000 mg | ORAL_TABLET | Freq: Every day | ORAL | 3 refills | Status: DC

## 2020-12-16 MED ORDER — SPIRONOLACTONE 25 MG PO TABS: 12.5000 mg | ORAL_TABLET | Freq: Every day | ORAL | 2 refills | Status: DC

## 2020-12-16 NOTE — Patient Instructions (Signed)
It was nice to see you today!  Your goal blood pressure is <130/80 mmHg.  Medication Changes:  Continue amlodipine 10 mg daily  Continue losartan 100 mg daily  Continue Spironolactone 12.5 mg daily (0.5 tablet)  Continue Chlorthalidone 25 mg daily  Monitor blood pressure at home daily and keep a log (on your phone or piece of paper) to bring with you to your next visit. Write down date, time, blood pressure and pulse.  Keep up the good work with diet and exercise. Aim for a diet full of vegetables, fruit and lean meats (chicken, [place], fish). Try to limit salt intake by eating fresh or frozen vegetables (instead of canned), rinse canned vegetables prior to cooking and do not add any additional salt to meals.   Please give [place] a call at [phone] with any questions or concerns.

## 2020-12-18 ENCOUNTER — Telehealth: Payer: Self-pay

## 2020-12-18 NOTE — Telephone Encounter (Signed)
Left a message for the pt to call back. Pt has appt with the Pharmacist 12/30/20.

## 2020-12-18 NOTE — Telephone Encounter (Signed)
-----   Message from Meriam Sprague, MD sent at 12/16/2020  9:48 PM EDT ----- Electrolytes and kidney function look good. Please monitor the blood pressures and we can adjust the medications as needed.

## 2020-12-22 ENCOUNTER — Telehealth: Payer: Self-pay | Admitting: Pharmacist

## 2020-12-22 NOTE — Telephone Encounter (Signed)
Received fax from [facility] that patient requested a replacement Repatha pen.  Had Dr Anne Fu sign and faxed back to [phone]

## 2020-12-25 DIAGNOSIS — Z01419 Encounter for gynecological examination (general) (routine) without abnormal findings: Secondary | ICD-10-CM | POA: Diagnosis not present

## 2020-12-25 DIAGNOSIS — Z1231 Encounter for screening mammogram for malignant neoplasm of breast: Secondary | ICD-10-CM | POA: Diagnosis not present

## 2020-12-25 DIAGNOSIS — Z6822 Body mass index (BMI) 22.0-22.9, adult: Secondary | ICD-10-CM | POA: Diagnosis not present

## 2020-12-25 NOTE — Telephone Encounter (Signed)
Patient called clinic regarding [employer] application and states her husbnad is having a surgery in July and would like to wait until he has surgery to submit appeal documents. She states she is still in the process of gathering documentation. She will f/u with [place] when she has this documentation ready.  Chesley Mires, PharmD, MPH Clinical Pharmacist (Rheumatology and Pulmonology

## 2020-12-29 NOTE — Progress Notes (Signed)
Patient ID: Brianna [name]                 DOB: 10-17-1950                      MRN: [id number]     HPI: Brianna [name] is a 70 y.o. female referred by Dr. Johney Frame to HTN clinic. PMH is significant for HTN, HLD, PVD, RA, migraines, arthritis, epilepsy and stroke. Pulmonary artery trunk noted to be enlarged on CT scan, indicative of possible underlying PH. Coronary artery calcifications also noted on CT scan. Pt last seen in HTN clinic on 12/16/20 and BP was 150/80. Reported running out of spironolactone a few days prior to visit, however reported adherence with all other HTN medications. Discussed importance of medication adherence and checking BP at home. No medication changes were made.  Patient presents today in good spirits. Reports medication adherence with all HTN medications. Continues to reports migraines/headaches secondary to increased ocular pressure and inflammation in the eyes, however has not needed Imitrex for acute migraine relief. Denies dizziness, blurred vision and LE swelling. Reports home BP readings between 126-135/80s with one high of 140/85.   Current HTN meds: spironolactone 12.5 mg daily, amlodipine 10 mg daily (AM), chlorthalidone 25 mg daily (AM), losartan 100 mg daily (AM) Previously tried: clonidine 0.1 mg BID (severe dry mouth, extreme fatigue, dizziness, and loss of appetite) BP goal: <130/80 mmHg   Family History: Father died from MI. Epilepsy in her sister; Heart disease in her father and mother; High Cholesterol in her brother, brother, brother, and sister; High blood pressure in her brother, brother, and brother; Lung disease in her mother; Prostate cancer in her maternal grandfather; Rheum arthritis in her maternal grandmother and sister; Uterine cancer in her mother  Social History: Former Smoker (Quit: 07/19/1988 - 0.5 ppd x14 years)   Diet: Still adding some salt to diet. Down to 2-3 cups of coffee a day (previously 4 cups daily), down to 0.5-1 energy drink  a day (previously 2 drinks daily). Eating chicken, sometimes hamburger, occasional steak. Needs to work on cutting down carbs and fats. Steak once a month. Tuna, salmon, pancakes, hamburgers, salads.  Does not eat breakfast  Exercise: walk with dog for 0.5 mile to 3-5 blocks, gardening, has not gone back to gym  Home BP readings: 6/8 - 6/14 BP: 126/88, 135/80, 130/80, 135/80, 130/80, 140/85  Labs: 11/11/20: Scr 0.65, K 4.1, Na 138, eGFR 96 10/28/20: Scr 0.78, K 4.4, Na 139, eGFR 83  Wt Readings from Last 3 Encounters:  12/16/20 114 lb 12.8 oz (52.1 kg)  10/30/20 119 lb (54 kg)  09/30/20 118 lb (53.5 kg)   BP Readings from Last 3 Encounters:  12/30/20 132/78  12/16/20 (!) 150/80  11/28/20 (!) 147/82   Pulse Readings from Last 3 Encounters:  12/16/20 63  11/28/20 77  10/30/20 76    Renal function: CrCl cannot be calculated (Unknown ideal weight.).  Past Medical History:  Diagnosis Date   Arthritis    in the neck   Carotid artery occlusion    Epilepsy (HCC)    GERD (gastroesophageal reflux disease)    Glaucoma    Hyperlipidemia    Hypertension    Keratoconus of both eyes 1981   Macular degeneration    Migraines    Neuromuscular disorder (HCC)    RA   Peripheral vascular disease ([place])    carotid occlusion surgery on left   Raynaud's disease  Retinal edema    Rheumatoid arthritis(714.0)    Stroke (HCC)    two mild strokes presumed from left carotid stenosis    Current Outpatient Medications on File Prior to Visit  Medication Sig Dispense Refill   ACETAMINOPHEN-BUTALBITAL 50-325 MG TABS Take by mouth daily as needed.     amLODipine (NORVASC) 10 MG tablet Take 10 mg by mouth daily.     Ascorbic Acid 500 MG CAPS Take by mouth.     aspirin 325 MG tablet Take 325 mg by mouth daily.     botulinum toxin Type A (BOTOX) 100 units SOLR injection Inject IM into the head and neck muscles every 3 months by provider in office 2 vial 3   butalbital-acetaminophen-caffeine  (FIORICET, ESGIC) 50-325-40 MG tablet Take 1 tablet by mouth every 6 (six) hours as needed for headache. 10 tablet 4   Calcium Carbonate-Vit D-Min (CALCIUM 1200 PO) Take 2 capsules by mouth daily.     chlorthalidone (HYGROTON) 25 MG tablet Take 1 tablet (25 mg total) by mouth daily. 30 tablet 11   Cholecalciferol (VITAMIN D PO) Take 1,000 Units by mouth daily.      Difluprednate (DUREZOL) 0.05 % EMUL 5 drops in left eye and 2 in right eye daily.     DILANTIN 100 MG ER capsule TAKE 3 CAPSULES BY MOUTH DAILY 270 capsule 3   Evolocumab (REPATHA SURECLICK) 233 MG/ML SOAJ Inject 1 pen into the skin every 14 (fourteen) days. 2 mL 11   ezetimibe (ZETIA) 10 MG tablet Take 1 tablet (10 mg total) by mouth daily. 90 tablet 3   famotidine (PEPCID) 20 MG tablet TAKE 1 TABLET(20 MG) BY MOUTH AT BEDTIME 90 tablet 1   HUMIRA PEN 40 MG/0.4ML PNKT Inject 40 mg into the skin every 14 (fourteen) days. As directed     hypromellose (GENTEAL) 0.3 % GEL ophthalmic ointment as needed.     leflunomide (ARAVA) 20 MG tablet Take 20 mg by mouth daily.     LORazepam (ATIVAN) 0.5 MG tablet Take 0.5 mg by mouth daily as needed for anxiety or sleep.     losartan (COZAAR) 100 MG tablet Take 1 tablet (100 mg total) by mouth daily. 90 tablet 3   pantoprazole (PROTONIX) 40 MG tablet TAKE 1 TABLET(40 MG) BY MOUTH DAILY 90 tablet 3   promethazine (PHENERGAN) 25 MG tablet Take 1 tablet (25 mg total) by mouth every 6 (six) hours as needed for nausea or vomiting. 30 tablet 11   RESTASIS 0.05 % ophthalmic emulsion 2 (two) times daily.     Rimegepant Sulfate (NURTEC) 75 MG TBDP Take 75 mg by mouth daily as needed. For migraines. Take as close to onset of migraine as possible. One daily maximum. 4 tablet 0   rosuvastatin (CRESTOR) 20 MG tablet Take 1 tablet (20 mg total) by mouth daily. 90 tablet 3   spironolactone (ALDACTONE) 25 MG tablet Take 0.5 tablets (12.5 mg total) by mouth daily. 15 tablet 2   SUMAtriptan (IMITREX) 6 MG/0.5ML SOLN  injection Inject 0.5 mLs (6 mg total) into the skin every 2 (two) hours as needed for migraine or headache. Take one dose at headache onset, can take additional dose 2hrs later if needed. No more then 2 injections in 24hrs 6 mL 11   tacrolimus (PROGRAF) 1 MG capsule Take 1 mg by mouth 2 (two) times daily.     Current Facility-Administered Medications on File Prior to Visit  Medication Dose Route Frequency Provider Last Rate Last Admin  0.9 %  sodium chloride infusion   Intravenous PRN Thompson, Kathryn R, PA-C       pneumococcal 23 valent vaccine (PNU-IMMUNE) injection 0.5 mL  0.5 mL Intramuscular Once Wert, Michael B, MD        Allergies  Allergen Reactions   Morphine Other (See Comments)    Difficulty breathing   Morphine And Related Shortness Of Breath   Alphagan [Brimonidine]     Eyelid swelling, scratching Allergic to preservative contained in this med   Codeine Nausea And Vomiting   Dorzolamide Hcl-Timolol Mal     Inflammation of the eyelid, scratching Allergic to preservative contained in this med   Hydrocodone-Acetaminophen Nausea And Vomiting    But tolerates tylenol   Sulfa Antibiotics Rash    "Large bumps"   Sulfamethoxazole Rash     Assessment/Plan:  1. Hypertension - BP near goal <130/80 mmHg. Medication adherence appears optimal. Continued spironolactone 12.5 mg daily, amlodipine 10 mg daily, losartan 100 mg daily, and chlorthalidone 25 mg daily. Encouraged patient to continue to check BP 3-4 times weekly and bring log to next visit. Patient verbalized understanding. Follow-up appointment scheduled in HTN clinic in 6 weeks.   Ivy Nwogu, PharmD, BCPS PGY2 Ambulatory Care Pharmacy Resident [facility] [address], , [place] [address] Phone: [phone]; Fax: [phone]  

## 2020-12-30 ENCOUNTER — Other Ambulatory Visit: Payer: Self-pay

## 2020-12-30 ENCOUNTER — Telehealth: Payer: Self-pay | Admitting: Neurology

## 2020-12-30 ENCOUNTER — Encounter: Payer: Self-pay | Admitting: Pharmacist

## 2020-12-30 ENCOUNTER — Ambulatory Visit (INDEPENDENT_AMBULATORY_CARE_PROVIDER_SITE_OTHER): Payer: Medicare PPO | Admitting: Pharmacist

## 2020-12-30 VITALS — BP 132/78

## 2020-12-30 DIAGNOSIS — I10 Essential (primary) hypertension: Secondary | ICD-10-CM

## 2020-12-30 NOTE — Telephone Encounter (Signed)
Spoke with patient. She is out of Aimovig. I advised pt will look into availability and give her  a call back.

## 2020-12-30 NOTE — Patient Instructions (Signed)
It was nice to see you today!  Your goal blood pressure is <130/80 mmHg.  Medication Changes:  Continue spironolactone 12.5 mg daily  Continue amlodipine 10 mg daily  Continue chlorthalidone 25 mg daily  Continue losartan 100 mg daily  Monitor blood pressure at home daily and keep a log (on your phone or piece of paper) to bring with you to your next visit. Write down date, time, blood pressure and pulse.  Keep up the good work with diet and exercise. Aim for a diet full of vegetables, fruit and lean meats (chicken, [place], fish). Try to limit salt intake by eating fresh or frozen vegetables (instead of canned), rinse canned vegetables prior to cooking and do not add any additional salt to meals.

## 2020-12-30 NOTE — Telephone Encounter (Signed)
Pt called stating that she is needing to speak to the Provider or RN regarding her being out of her migraine preventative medication. Please advise.

## 2020-12-31 NOTE — Telephone Encounter (Signed)
We are out of samples. I spoke with patient. She understands. I suggested she call back in 1-2 weeks to see. She verbalized appreciation.

## 2021-01-09 IMAGING — CT CT PARANASAL SINUSES LIMITED
2 of 4 series · 16 of 22 positions shown, 18 images · non-contrast
Comparison: 01/15/2011.

CLINICAL DATA: Cough, persistent

EXAM:
CT PARANASAL SINUS LIMITED WITHOUT CONTRAST
TECHNIQUE: Non-contiguous multidetector CT images of the paranasal sinuses were
obtained in a single plane without contrast.

[Series 4: limited sinus bone · axial · 0.28mm/px · z∈[+165,+235]mm · 8 of 10 slices shown, 10 images]
[im 2/10  brain]
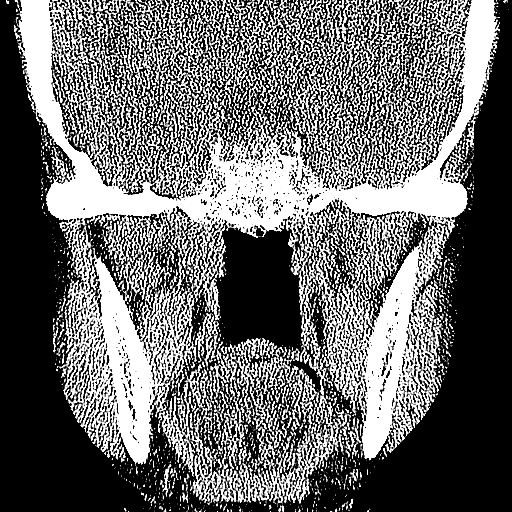
[im 2/10  bone]
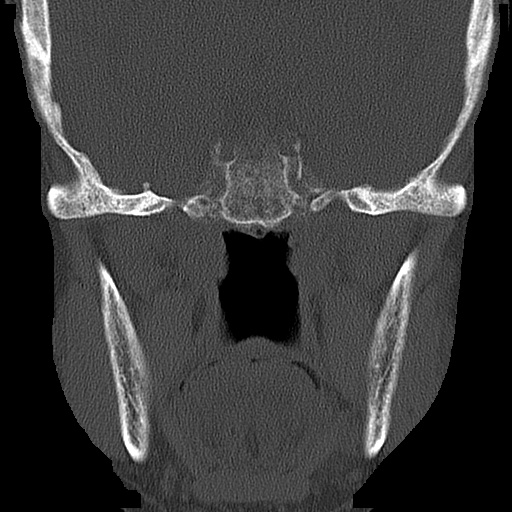
[im 3/10  bone]
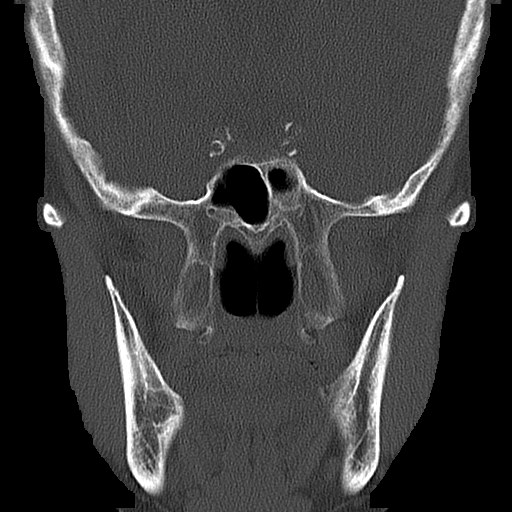
[im 4/10  bone]
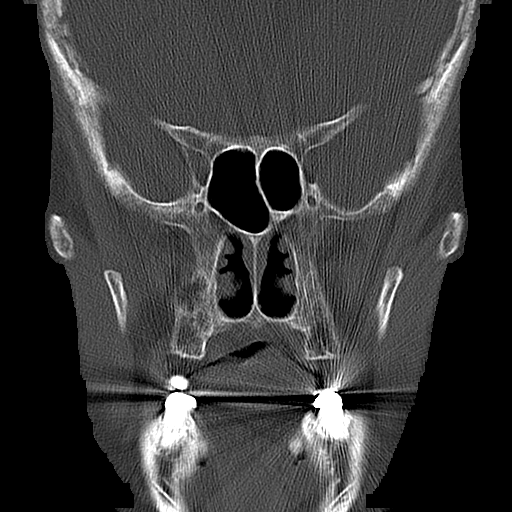
[im 5/10  bone]
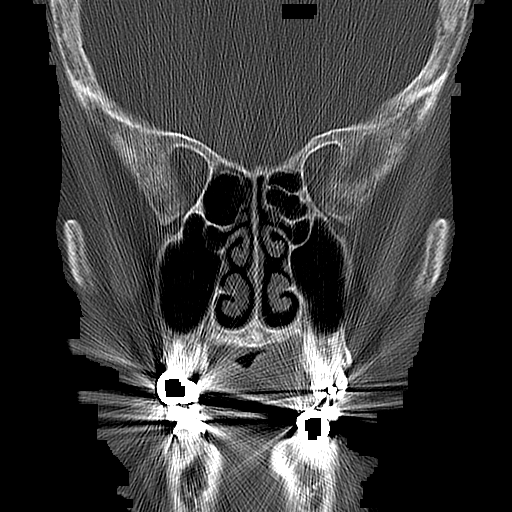
[im 6/10  brain]
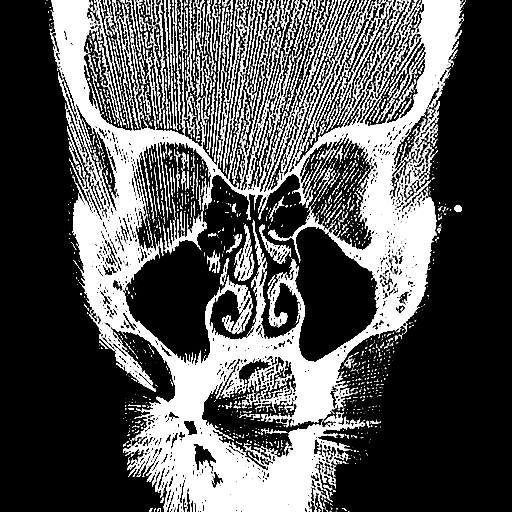
[im 6/10  bone]
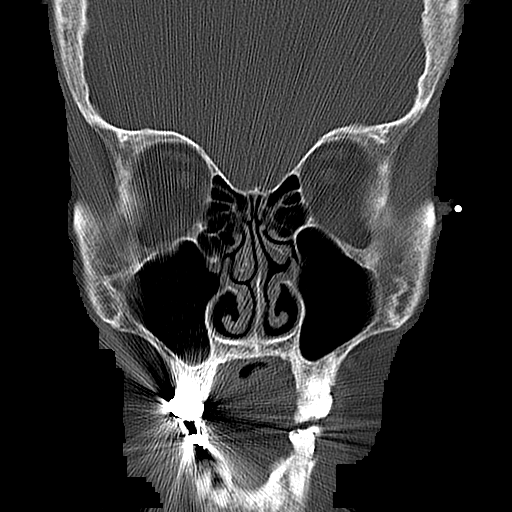
[im 7/10  bone]
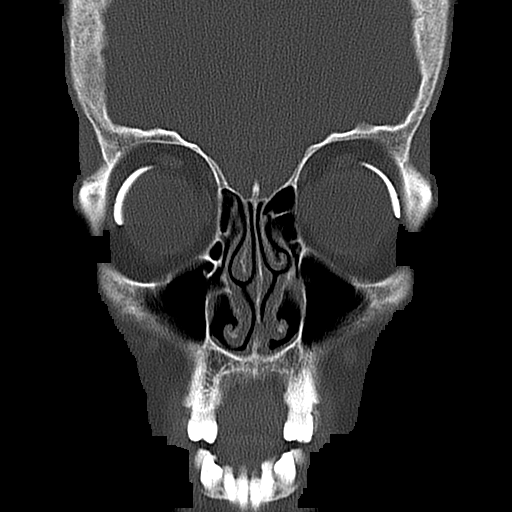
[im 8/10  bone]
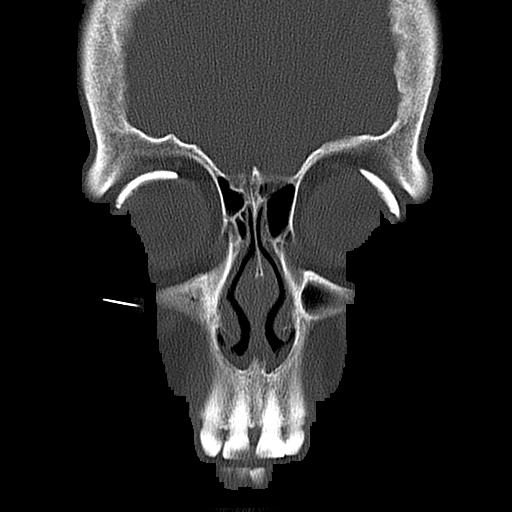
[im 9/10  bone]
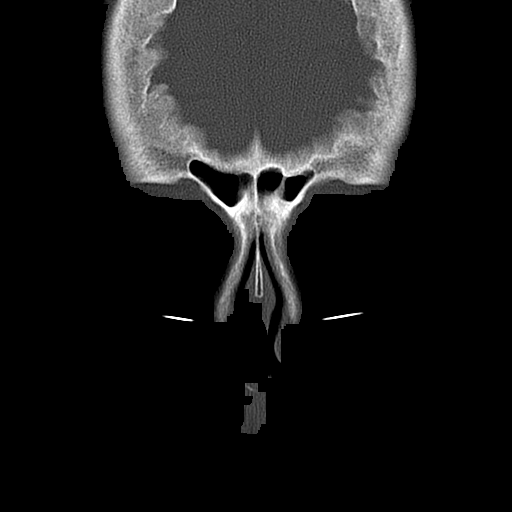

[Series 5: limited sinus st · axial · 0.28mm/px · z∈[+165,+235]mm · 8 of 10 slices shown]
[im 2/10  bone]
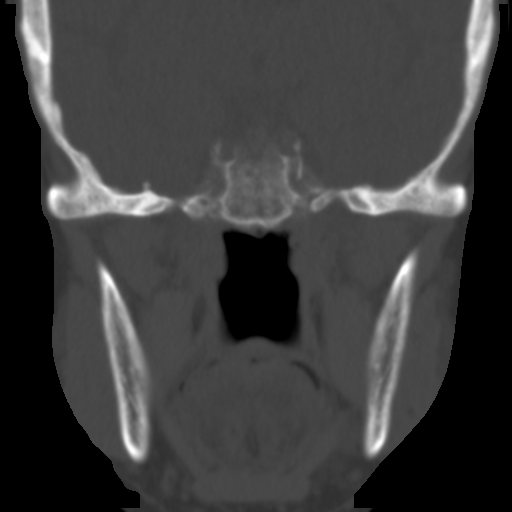
[im 3/10  bone]
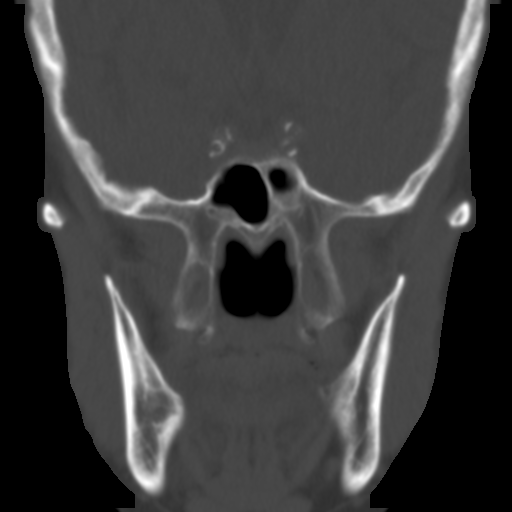
[im 4/10  bone]
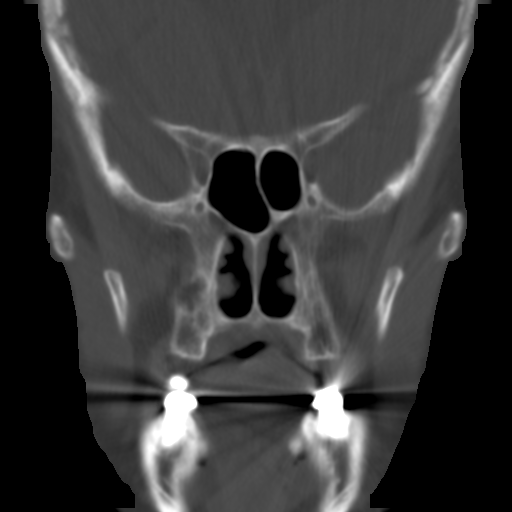
[im 5/10  bone]
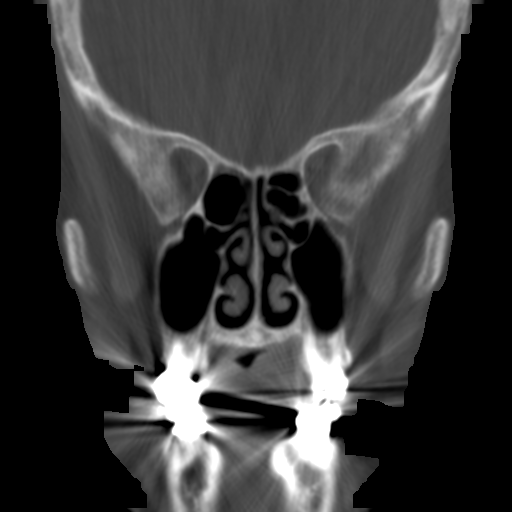
[im 6/10  bone]
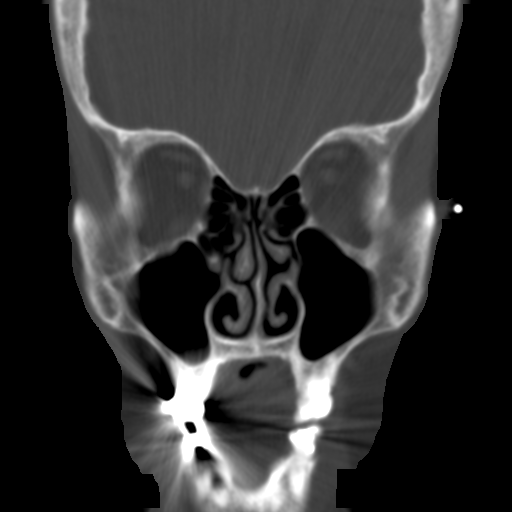
[im 7/10  bone]
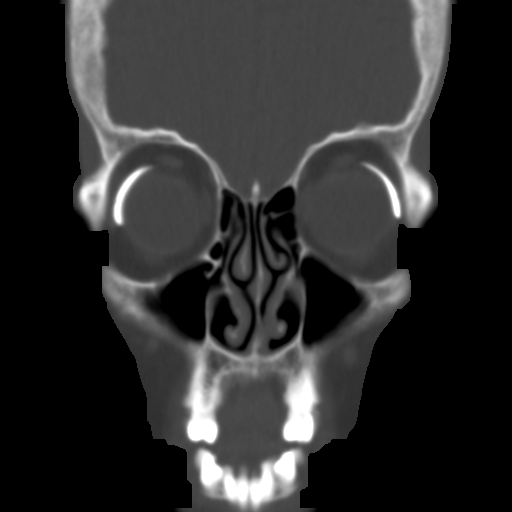
[im 8/10  bone]
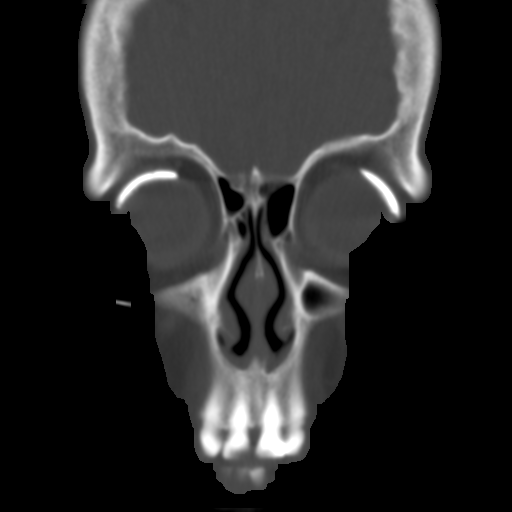
[im 9/10  bone]
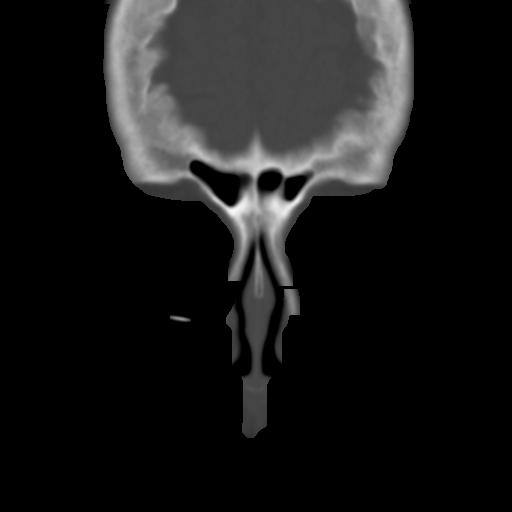

[16 of 22 positions shown; findings below may reference images not displayed]

FINDINGS: Paranasal sinuses:

Frontal: Normally aerated.

Ethmoid: Normally aerated.

Maxillary: Normally aerated.

Sphenoid: Normally aerated.

Right ostiomeatal unit: Mild narrowing proximally secondary to
mucosal thickening

Left ostiomeatal unit: Mild narrowing proximally secondary to
mucosal thickening.

Nasal passages: Patent. Midline nasal septum.

Other: Postprocedural appearance of the orbits.
IMPRESSION: Clear paranasal sinuses. Mild proximal narrowing of the bilateral
ostiomeatal units secondary to mucosal thickening.

Otherwise patent sinonasal drainage pathways.

## 2021-01-26 ENCOUNTER — Telehealth: Payer: Self-pay | Admitting: Neurology

## 2021-01-26 NOTE — Telephone Encounter (Signed)
Called pt on mobile and LVM (Ok per [employer]) advising we can provide aimovig samples to her. Could she come tomorrow afternoon 7/12?

## 2021-01-26 NOTE — Telephone Encounter (Signed)
Per [place] in phone room, patient called back and stated she would come at 2:30 pm tomorrow.

## 2021-01-26 NOTE — Telephone Encounter (Signed)
Pt called statin she is out of the Aimovig. Hasn't had any in about two months. Pt requesting a call back.

## 2021-01-27 MED ORDER — AIMOVIG 140 MG/ML ~~LOC~~ SOAJ: 140.0000 mg | SUBCUTANEOUS | 0 refills | Status: DC

## 2021-01-27 NOTE — Addendum Note (Signed)
Addended by: Bertram Savin on: 01/27/2021 03:53 PM   Modules accepted: Orders

## 2021-01-27 NOTE — Telephone Encounter (Signed)
Aimovig samples given x 6 per Dr Lucia Gaskins.

## 2021-02-02 DIAGNOSIS — M79645 Pain in left finger(s): Secondary | ICD-10-CM | POA: Diagnosis not present

## 2021-02-02 DIAGNOSIS — M65312 Trigger thumb, left thumb: Secondary | ICD-10-CM | POA: Diagnosis not present

## 2021-02-02 DIAGNOSIS — M1812 Unilateral primary osteoarthritis of first carpometacarpal joint, left hand: Secondary | ICD-10-CM | POA: Diagnosis not present

## 2021-02-02 DIAGNOSIS — M25642 Stiffness of left hand, not elsewhere classified: Secondary | ICD-10-CM | POA: Diagnosis not present

## 2021-02-02 DIAGNOSIS — M19042 Primary osteoarthritis, left hand: Secondary | ICD-10-CM | POA: Diagnosis not present

## 2021-02-03 ENCOUNTER — Institutional Professional Consult (permissible substitution): Payer: Medicare PPO | Admitting: Neurology

## 2021-02-09 ENCOUNTER — Telehealth: Payer: Self-pay

## 2021-02-09 NOTE — Telephone Encounter (Signed)
LMOM TO R/S 8/4 APPT TO A LATER DATE

## 2021-02-10 ENCOUNTER — Other Ambulatory Visit: Payer: Medicare PPO

## 2021-02-10 ENCOUNTER — Ambulatory Visit: Payer: Medicare PPO

## 2021-02-11 ENCOUNTER — Emergency Department (HOSPITAL_COMMUNITY): Payer: Medicare PPO

## 2021-02-11 ENCOUNTER — Encounter (HOSPITAL_COMMUNITY): Payer: Self-pay

## 2021-02-11 ENCOUNTER — Other Ambulatory Visit: Payer: Self-pay

## 2021-02-11 ENCOUNTER — Inpatient Hospital Stay (HOSPITAL_COMMUNITY)
Admission: EM | Admit: 2021-02-11 | Discharge: 2021-02-15 | DRG: 516 | Disposition: A | Discharge status: Home Health | Payer: Medicare PPO | Attending: Internal Medicine | Admitting: Internal Medicine

## 2021-02-11 DIAGNOSIS — S82032A Displaced transverse fracture of left patella, initial encounter for closed fracture: Secondary | ICD-10-CM | POA: Diagnosis not present

## 2021-02-11 DIAGNOSIS — K219 Gastro-esophageal reflux disease without esophagitis: Secondary | ICD-10-CM | POA: Diagnosis present

## 2021-02-11 DIAGNOSIS — Z79899 Other long term (current) drug therapy: Secondary | ICD-10-CM

## 2021-02-11 DIAGNOSIS — Z8673 Personal history of transient ischemic attack (TIA), and cerebral infarction without residual deficits: Secondary | ICD-10-CM

## 2021-02-11 DIAGNOSIS — S82002A Unspecified fracture of left patella, initial encounter for closed fracture: Secondary | ICD-10-CM

## 2021-02-11 DIAGNOSIS — Z8049 Family history of malignant neoplasm of other genital organs: Secondary | ICD-10-CM | POA: Diagnosis not present

## 2021-02-11 DIAGNOSIS — J8410 Pulmonary fibrosis, unspecified: Secondary | ICD-10-CM | POA: Diagnosis not present

## 2021-02-11 DIAGNOSIS — Z66 Do not resuscitate: Secondary | ICD-10-CM | POA: Diagnosis present

## 2021-02-11 DIAGNOSIS — R52 Pain, unspecified: Secondary | ICD-10-CM | POA: Diagnosis present

## 2021-02-11 DIAGNOSIS — G43719 Chronic migraine without aura, intractable, without status migrainosus: Secondary | ICD-10-CM | POA: Diagnosis not present

## 2021-02-11 DIAGNOSIS — I10 Essential (primary) hypertension: Secondary | ICD-10-CM | POA: Diagnosis not present

## 2021-02-11 DIAGNOSIS — I7300 Raynaud's syndrome without gangrene: Secondary | ICD-10-CM | POA: Diagnosis present

## 2021-02-11 DIAGNOSIS — E559 Vitamin D deficiency, unspecified: Secondary | ICD-10-CM | POA: Diagnosis not present

## 2021-02-11 DIAGNOSIS — G43709 Chronic migraine without aura, not intractable, without status migrainosus: Secondary | ICD-10-CM | POA: Diagnosis present

## 2021-02-11 DIAGNOSIS — Z7982 Long term (current) use of aspirin: Secondary | ICD-10-CM

## 2021-02-11 DIAGNOSIS — M7989 Other specified soft tissue disorders: Secondary | ICD-10-CM | POA: Diagnosis not present

## 2021-02-11 DIAGNOSIS — H5462 Unqualified visual loss, left eye, normal vision right eye: Secondary | ICD-10-CM | POA: Diagnosis present

## 2021-02-11 DIAGNOSIS — Z83438 Family history of other disorder of lipoprotein metabolism and other lipidemia: Secondary | ICD-10-CM | POA: Diagnosis not present

## 2021-02-11 DIAGNOSIS — G40909 Epilepsy, unspecified, not intractable, without status epilepticus: Secondary | ICD-10-CM | POA: Diagnosis present

## 2021-02-11 DIAGNOSIS — Z8261 Family history of arthritis: Secondary | ICD-10-CM

## 2021-02-11 DIAGNOSIS — R531 Weakness: Secondary | ICD-10-CM | POA: Diagnosis not present

## 2021-02-11 DIAGNOSIS — Z820 Family history of epilepsy and other diseases of the nervous system: Secondary | ICD-10-CM | POA: Diagnosis not present

## 2021-02-11 DIAGNOSIS — Y92010 Kitchen of single-family (private) house as the place of occurrence of the external cause: Secondary | ICD-10-CM

## 2021-02-11 DIAGNOSIS — E785 Hyperlipidemia, unspecified: Secondary | ICD-10-CM | POA: Diagnosis present

## 2021-02-11 DIAGNOSIS — S82009A Unspecified fracture of unspecified patella, initial encounter for closed fracture: Secondary | ICD-10-CM | POA: Diagnosis not present

## 2021-02-11 DIAGNOSIS — W010XXA Fall on same level from slipping, tripping and stumbling without subsequent striking against object, initial encounter: Secondary | ICD-10-CM | POA: Diagnosis not present

## 2021-02-11 DIAGNOSIS — Z20822 Contact with and (suspected) exposure to covid-19: Secondary | ICD-10-CM | POA: Diagnosis present

## 2021-02-11 DIAGNOSIS — Z882 Allergy status to sulfonamides status: Secondary | ICD-10-CM

## 2021-02-11 DIAGNOSIS — Z832 Family history of diseases of the blood and blood-forming organs and certain disorders involving the immune mechanism: Secondary | ICD-10-CM

## 2021-02-11 DIAGNOSIS — M059 Rheumatoid arthritis with rheumatoid factor, unspecified: Secondary | ICD-10-CM | POA: Diagnosis present

## 2021-02-11 DIAGNOSIS — H409 Unspecified glaucoma: Secondary | ICD-10-CM | POA: Diagnosis present

## 2021-02-11 DIAGNOSIS — Z8249 Family history of ischemic heart disease and other diseases of the circulatory system: Secondary | ICD-10-CM | POA: Diagnosis not present

## 2021-02-11 DIAGNOSIS — Z888 Allergy status to other drugs, medicaments and biological substances status: Secondary | ICD-10-CM

## 2021-02-11 DIAGNOSIS — Z885 Allergy status to narcotic agent status: Secondary | ICD-10-CM

## 2021-02-11 DIAGNOSIS — M810 Age-related osteoporosis without current pathological fracture: Secondary | ICD-10-CM | POA: Diagnosis present

## 2021-02-11 DIAGNOSIS — M47812 Spondylosis without myelopathy or radiculopathy, cervical region: Secondary | ICD-10-CM | POA: Diagnosis present

## 2021-02-11 DIAGNOSIS — D62 Acute posthemorrhagic anemia: Secondary | ICD-10-CM | POA: Diagnosis not present

## 2021-02-11 DIAGNOSIS — E7800 Pure hypercholesterolemia, unspecified: Secondary | ICD-10-CM | POA: Diagnosis not present

## 2021-02-11 DIAGNOSIS — Z419 Encounter for procedure for purposes other than remedying health state, unspecified: Secondary | ICD-10-CM

## 2021-02-11 DIAGNOSIS — S82002D Unspecified fracture of left patella, subsequent encounter for closed fracture with routine healing: Secondary | ICD-10-CM | POA: Diagnosis not present

## 2021-02-11 DIAGNOSIS — R569 Unspecified convulsions: Secondary | ICD-10-CM | POA: Diagnosis not present

## 2021-02-11 DIAGNOSIS — Z87891 Personal history of nicotine dependence: Secondary | ICD-10-CM

## 2021-02-11 DIAGNOSIS — W19XXXA Unspecified fall, initial encounter: Secondary | ICD-10-CM

## 2021-02-11 LAB — BASIC METABOLIC PANEL
Anion gap: 9 (ref 5–15)
BUN: 12 mg/dL (ref 8–23)
CO2: 28 mmol/L (ref 22–32)
Calcium: 9.5 mg/dL (ref 8.9–10.3)
Chloride: 101 mmol/L (ref 98–111)
Creatinine, Ser: 0.73 mg/dL (ref 0.44–1.00)
GFR, Estimated: >60 mL/min (ref >60)
Glucose, Bld: 108 mg/dL — ABNORMAL HIGH (ref 70–99)
Potassium: 4.4 mmol/L (ref 3.5–5.1)
Sodium: 138 mmol/L (ref 135–145)

## 2021-02-11 LAB — RESP PANEL BY RT-PCR (FLU A&B, COVID) ARPGX2
Influenza A by PCR: NEGATIVE
Influenza B by PCR: NEGATIVE
SARS Coronavirus 2 by RT PCR: NEGATIVE

## 2021-02-11 LAB — CBC WITH DIFFERENTIAL/PLATELET
Abs Immature Granulocytes: 0.01 10*3/uL (ref 0.00–0.07)
Basophils Absolute: 0.1 10*3/uL (ref 0.0–0.1)
Basophils Relative: 1 %
Eosinophils Absolute: 0.5 10*3/uL (ref 0.0–0.5)
Eosinophils Relative: 6 %
HCT: 41.6 % (ref 36.0–46.0)
Hemoglobin: 13.3 g/dL (ref 12.0–15.0)
Immature Granulocytes: 0 %
Lymphocytes Relative: 13 %
Lymphs Abs: 1.1 10*3/uL (ref 0.7–4.0)
MCH: 29 pg (ref 26.0–34.0)
MCHC: 32 g/dL (ref 30.0–36.0)
MCV: 90.8 fL (ref 80.0–100.0)
Monocytes Absolute: 1 10*3/uL (ref 0.1–1.0)
Monocytes Relative: 12 %
Neutro Abs: 5.4 10*3/uL (ref 1.7–7.7)
Neutrophils Relative %: 68 %
Platelets: 238 10*3/uL (ref 150–400)
RBC: 4.58 MIL/uL (ref 3.87–5.11)
RDW: 13.7 % (ref 11.5–15.5)
WBC: 8 10*3/uL (ref 4.0–10.5)
nRBC: 0 % (ref 0.0–0.2)

## 2021-02-11 MED ORDER — CHLORHEXIDINE GLUCONATE 4 % EX LIQD
60.0000 mL | Freq: Once | CUTANEOUS | Status: DC
  Filled 2021-02-11: qty 15

## 2021-02-11 MED ORDER — BISACODYL 5 MG PO TBEC: 5.0000 mg | DELAYED_RELEASE_TABLET | Freq: Every day | ORAL | Status: DC | PRN

## 2021-02-11 MED ORDER — CHLORHEXIDINE GLUCONATE 4 % EX LIQD: 60.0000 mL | Freq: Once | CUTANEOUS | Status: DC

## 2021-02-11 MED ORDER — FENTANYL CITRATE (PF) 100 MCG/2ML IJ SOLN
50.0000 ug | Freq: Once | INTRAMUSCULAR | Status: AC
  Administered 2021-02-11: 50 ug via INTRAVENOUS

## 2021-02-11 MED ORDER — SPIRONOLACTONE 12.5 MG HALF TABLET
12.5000 mg | ORAL_TABLET | Freq: Every day | ORAL | Status: DC
  Administered 2021-02-13 – 2021-02-15 (×3): 12.5 mg via ORAL
  Filled 2021-02-11 (×4): qty 1

## 2021-02-11 MED ORDER — CYCLOSPORINE 0.05 % OP EMUL
1.0000 [drp] | Freq: Two times a day (BID) | OPHTHALMIC | Status: DC
  Administered 2021-02-11 – 2021-02-15 (×7): 1 [drp] via OPHTHALMIC
  Filled 2021-02-11 (×9): qty 1

## 2021-02-11 MED ORDER — PANTOPRAZOLE SODIUM 40 MG PO TBEC
40.0000 mg | DELAYED_RELEASE_TABLET | Freq: Every day | ORAL | Status: DC
  Administered 2021-02-12 – 2021-02-15 (×4): 40 mg via ORAL
  Filled 2021-02-11 (×4): qty 1

## 2021-02-11 MED ORDER — CHLORTHALIDONE 25 MG PO TABS
25.0000 mg | ORAL_TABLET | Freq: Every day | ORAL | Status: DC
  Administered 2021-02-13 – 2021-02-15 (×3): 25 mg via ORAL
  Filled 2021-02-11 (×4): qty 1

## 2021-02-11 MED ORDER — LOSARTAN POTASSIUM 50 MG PO TABS
100.0000 mg | ORAL_TABLET | Freq: Every day | ORAL | Status: DC
  Administered 2021-02-12 – 2021-02-15 (×4): 100 mg via ORAL
  Filled 2021-02-11 (×4): qty 2

## 2021-02-11 MED ORDER — ONDANSETRON HCL 4 MG/2ML IJ SOLN: 4.0000 mg | Freq: Four times a day (QID) | INTRAMUSCULAR | Status: DC | PRN

## 2021-02-11 MED ORDER — SODIUM CHLORIDE 0.9 % IV BOLUS
500.0000 mL | Freq: Once | INTRAVENOUS | Status: AC
  Administered 2021-02-11: 500 mL via INTRAVENOUS

## 2021-02-11 MED ORDER — METHOCARBAMOL 500 MG PO TABS
500.0000 mg | ORAL_TABLET | Freq: Four times a day (QID) | ORAL | Status: DC | PRN
  Administered 2021-02-11 – 2021-02-15 (×7): 500 mg via ORAL
  Filled 2021-02-11 (×7): qty 1

## 2021-02-11 MED ORDER — DIFLUPREDNATE 0.05 % OP EMUL
2.0000 [drp] | Freq: Every day | OPHTHALMIC | Status: DC
  Administered 2021-02-12: 2 [drp] via OPHTHALMIC
  Administered 2021-02-13 – 2021-02-15 (×3): 5 [drp] via OPHTHALMIC

## 2021-02-11 MED ORDER — POVIDONE-IODINE 10 % EX SWAB: 2.0000 "application " | Freq: Once | CUTANEOUS | Status: DC

## 2021-02-11 MED ORDER — LORAZEPAM 0.5 MG PO TABS
0.5000 mg | ORAL_TABLET | Freq: Every day | ORAL | Status: DC | PRN
  Administered 2021-02-11 – 2021-02-12 (×2): 0.5 mg via ORAL
  Filled 2021-02-11 (×2): qty 1

## 2021-02-11 MED ORDER — METHOCARBAMOL 1000 MG/10ML IJ SOLN
500.0000 mg | Freq: Four times a day (QID) | INTRAVENOUS | Status: DC | PRN
  Filled 2021-02-11 (×2): qty 5

## 2021-02-11 MED ORDER — POLYETHYLENE GLYCOL 3350 17 G PO PACK: 17.0000 g | PACK | Freq: Every day | ORAL | Status: DC | PRN

## 2021-02-11 MED ORDER — ATORVASTATIN CALCIUM 40 MG PO TABS
40.0000 mg | ORAL_TABLET | Freq: Every day | ORAL | Status: DC
  Administered 2021-02-12: 40 mg via ORAL
  Filled 2021-02-11: qty 1

## 2021-02-11 MED ORDER — CEFAZOLIN SODIUM-DEXTROSE 2-4 GM/100ML-% IV SOLN: 2.0000 g | INTRAVENOUS | Status: DC

## 2021-02-11 MED ORDER — AMLODIPINE BESYLATE 10 MG PO TABS
10.0000 mg | ORAL_TABLET | Freq: Every day | ORAL | Status: DC
  Administered 2021-02-12 – 2021-02-15 (×4): 10 mg via ORAL
  Filled 2021-02-11 (×4): qty 1

## 2021-02-11 MED ORDER — FENTANYL CITRATE (PF) 100 MCG/2ML IJ SOLN
50.0000 ug | INTRAMUSCULAR | Status: DC | PRN
  Administered 2021-02-11 – 2021-02-12 (×6): 50 ug via INTRAVENOUS
  Filled 2021-02-11 (×8): qty 2

## 2021-02-11 MED ORDER — BUTALBITAL-APAP-CAFFEINE 50-325-40 MG PO TABS: 1.0000 | ORAL_TABLET | Freq: Four times a day (QID) | ORAL | Status: DC | PRN

## 2021-02-11 MED ORDER — DOCUSATE SODIUM 100 MG PO CAPS
100.0000 mg | ORAL_CAPSULE | Freq: Two times a day (BID) | ORAL | Status: DC
  Administered 2021-02-11 – 2021-02-15 (×8): 100 mg via ORAL
  Filled 2021-02-11 (×8): qty 1

## 2021-02-11 MED ORDER — PHENYTOIN SODIUM EXTENDED 100 MG PO CAPS
300.0000 mg | ORAL_CAPSULE | Freq: Every day | ORAL | Status: DC
  Administered 2021-02-13 – 2021-02-15 (×3): 300 mg via ORAL
  Filled 2021-02-11 (×3): qty 3

## 2021-02-11 MED ORDER — FAMOTIDINE 20 MG PO TABS
20.0000 mg | ORAL_TABLET | Freq: Every day | ORAL | Status: DC
  Administered 2021-02-11 – 2021-02-14 (×4): 20 mg via ORAL
  Filled 2021-02-11 (×4): qty 1

## 2021-02-11 NOTE — H&P (Signed)
History and Physical    Brianna [name] ZOX:[id number] DOB: 06-28-1951 DOA: 02/11/2021  PCP: Irena Reichmann, DO Consultants:  Merlyn Lot - orthopedics; Lucia Gaskins - neurology; Wert - pulmonology; [facility] - cardiology; Fields - vascular surgery Patient coming from:  Home - lives with husband; NOK: Husband, [phone]  Chief Complaint: fall  HPI: Brianna [name] is a 70 y.o. female with medical history significant of epilepsy; HLD; HTN; RA; PVD; and CVA presenting with a mechanical fall.   She reports that she slipped on water in the floor from her dog - she doesn't see out of her left eye and didn't see it.  She landed directly on her knee and could not move it immediately.  Pain is reasonably controlled at this time.    ED Course: Patellar fracture from mechanical fall.  Ortho will repair due to severity of injury, hopefully tomorrow.  Not on AC.  Review of Systems: As per HPI; otherwise review of systems reviewed and negative.   Ambulatory Status:  Ambulates without assistance  COVID Vaccine Status:  Complete plus 2 boosters  Past Medical History:  Diagnosis Date   Arthritis    in the neck   Carotid artery occlusion    Epilepsy (HCC)    GERD (gastroesophageal reflux disease)    Glaucoma    Hyperlipidemia    Hypertension    Keratoconus of both eyes 1981   Macular degeneration    Migraines    Peripheral vascular disease (HCC)    carotid occlusion surgery on left   Raynaud's disease    Retinal edema    Rheumatoid arthritis(714.0)    Stroke (HCC)    two mild strokes presumed from left carotid stenosis    Past Surgical History:  Procedure Laterality Date   CAROTID ARTERY ANGIOPLASTY  2008   Dr Madilyn Fireman   CAROTID ENDARTERECTOMY Left 11-15-2007   cataract extraction Right 04-2014   [facility]   CHOLECYSTECTOMY  1995   CORNEAL TRANSPLANT     x 5 ; steroid inj. 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   [facility]   CORNEAL TRANSPLANT Left 04/17/2019   EYE  SURGERY Right 12/2016   cornea repair    EYE SURGERY Right 09/20/2017   cornea transplant    GLAUCOMA SURGERY Right 2018   TRIGGER FINGER RELEASE Right 05/16/2018   Procedure: RELEASE TRIGGER FINGER/A-1 PULLEY RIGHT THUMB;  Surgeon: Cindee Salt, MD;  Location: [facility];  Service: Orthopedics;  Laterality: Right;    Social History   Socioeconomic History   Marital status: Married    Spouse name: [name]   Number of children: 2   Years of education: Bachelor   Highest education level: Not on file  Occupational History   Occupation: Runner, broadcasting/film/video   Occupation: AG TEACHER    Employer: [employer]    Comment: retired  Tobacco Use   Smoking status: Former    Packs/day: 0.50    Years: 14.00    Pack years: 7.00    Types: Cigarettes    Quit date: 07/19/1988    Years since quitting: 32.5   Smokeless tobacco: Never  Vaping Use   Vaping Use: Never used  Substance and Sexual Activity   Alcohol use: No   Drug use: No   Sexual activity: Not on file  Other Topics Concern   Not on file  Social History Narrative   Patient is married to [name]), has 2 children   Patient is right handed   Education level  is Bachelor's degree   Caffeine consumption is about 2 cups daily   Lives at home with husband    Social Determinants of Corporate investment banker Strain: Not on file  Food Insecurity: Not on file  Transportation Needs: Not on file  Physical Activity: Not on file  Stress: Not on file  Social Connections: Not on file  Intimate Partner Violence: Not on file    Allergies  Allergen Reactions   Morphine Other (See Comments)    Difficulty breathing   Morphine And Related Shortness Of Breath   Alphagan [Brimonidine]     Eyelid swelling, scratching Allergic to preservative contained in this med   Codeine Nausea And Vomiting   Dorzolamide Hcl-Timolol Mal     Inflammation of the eyelid, scratching Allergic to preservative contained in this med    Hydrocodone-Acetaminophen Nausea And Vomiting    But tolerates tylenol   Sulfa Antibiotics Rash    "Large bumps"   Sulfamethoxazole Rash    Family History  Problem Relation Age of Onset   Heart disease Mother    Lung disease Mother        ? disease process   Uterine cancer Mother    Heart disease Father    Clotting disorder Father    Collagen disease Father    Cervical cancer Maternal Aunt    Prostate cancer Maternal Grandfather    Rheum arthritis Sister    High Cholesterol Sister    Epilepsy Sister    Rheum arthritis Maternal Grandmother    High Cholesterol Brother    High blood pressure Brother    High blood pressure Brother    High Cholesterol Brother    High blood pressure Brother    High Cholesterol Brother     Prior to Admission medications   Medication Sig Start Date End Date Taking? Authorizing Provider  amLODipine (NORVASC) 10 MG tablet Take 10 mg by mouth daily. 10/24/19  Yes [provider]  Ascorbic Acid 500 MG CAPS Take by mouth.   Yes [provider]  aspirin 325 MG tablet Take 325 mg by mouth daily.   Yes [provider]  atorvastatin (LIPITOR) 40 MG tablet Take 40 mg by mouth daily.   Yes [provider]  botulinum toxin Type A (BOTOX) 100 units SOLR injection Inject IM into the head and neck muscles every 3 months by provider in office 02/26/16  Yes Anson Fret, MD  butalbital-acetaminophen-caffeine (FIORICET, ESGIC) 50-325-40 MG tablet Take 1 tablet by mouth every 6 (six) hours as needed for headache. 04/05/18  Yes Anson Fret, MD  Calcium Carbonate-Vit D-Min (CALCIUM 1200 PO) Take 2 capsules by mouth daily.   Yes [provider]  chlorthalidone (HYGROTON) 25 MG tablet Take 1 tablet (25 mg total) by mouth daily. 05/27/20 05/22/21 Yes Jake Bathe, MD  Cholecalciferol (VITAMIN D PO) Take 1,000 Units by mouth daily.    Yes [provider]  Difluprednate (DUREZOL) 0.05 % EMUL 5 drops in left eye and 2  in right eye daily.   Yes [provider]  Difluprednate 0.05 % EMUL 6 drops. 4 drops in the left eye, 2 drops in the right eye 11/18/20  Yes [provider]  DILANTIN 100 MG ER capsule TAKE 3 CAPSULES BY MOUTH DAILY 03/06/20  Yes Anson Fret, MD  Erenumab-aooe (AIMOVIG) 140 MG/ML SOAJ Inject 140 mg into the skin every 30 (thirty) days. 01/27/21  Yes Anson Fret, MD  Evolocumab [facility] SURECLICK)  140 MG/ML SOAJ Inject 1 pen into the skin every 14 (fourteen) days. 12/16/20  Yes Meriam Sprague, MD  ezetimibe (ZETIA) 10 MG tablet Take 1 tablet (10 mg total) by mouth daily. 12/16/20  Yes Meriam Sprague, MD  famotidine (PEPCID) 20 MG tablet TAKE 1 TABLET(20 MG) BY MOUTH AT BEDTIME 08/22/20  Yes Nyoka Cowden, MD  HUMIRA PEN 40 MG/0.4ML PNKT Inject 40 mg into the skin every 14 (fourteen) days. As directed 06/13/19  Yes [provider]  leflunomide (ARAVA) 20 MG tablet Take 20 mg by mouth daily.   Yes [provider]  LORazepam (ATIVAN) 0.5 MG tablet Take 0.5 mg by mouth daily as needed for anxiety or sleep.   Yes [provider]  losartan (COZAAR) 100 MG tablet Take 1 tablet (100 mg total) by mouth daily. 10/28/20  Yes Jake Bathe, MD  losartan (COZAAR) 50 MG tablet Take 50 mg by mouth daily. 12/23/20  Yes [provider]  pantoprazole (PROTONIX) 40 MG tablet TAKE 1 TABLET(40 MG) BY MOUTH DAILY 11/10/20  Yes Nyoka Cowden, MD  promethazine (PHENERGAN) 25 MG tablet Take 1 tablet (25 mg total) by mouth every 6 (six) hours as needed for nausea or vomiting. 04/05/18  Yes Anson Fret, MD  promethazine (PHENERGAN) 25 MG tablet Take by mouth.   Yes [provider]  RESTASIS 0.05 % ophthalmic emulsion 2 (two) times daily. 01/18/20  Yes [provider]  Rimegepant Sulfate (NURTEC) 75 MG TBDP Take 75 mg by mouth daily as needed. For migraines. Take as close to onset of migraine as possible. One daily maximum. 08/07/20  Yes  Anson Fret, MD  spironolactone (ALDACTONE) 25 MG tablet Take 0.5 tablets (12.5 mg total) by mouth daily. 12/16/20 03/16/21 Yes Meriam Sprague, MD  SUMAtriptan (IMITREX) 6 MG/0.5ML SOLN injection Inject 0.5 mLs (6 mg total) into the skin every 2 (two) hours as needed for migraine or headache. Take one dose at headache onset, can take additional dose 2hrs later if needed. No more then 2 injections in 24hrs 12/01/20  Yes Anson Fret, MD  SUMAtriptan 6 MG/0.5ML SOAJ Inject 0.5 mLs into the skin as needed. 12/03/20  Yes [provider]  tacrolimus (PROGRAF) 1 MG capsule Take 1 mg by mouth 2 (two) times daily.   Yes [provider]  ascorbic Acid (VITAMIN C) 500 MG CPCR Take 500 mg by mouth daily.    [provider]  hypromellose (GENTEAL) 0.3 % GEL ophthalmic ointment as needed. 12/21/19   [provider]  leflunomide (ARAVA) 20 MG tablet Take 1 tablet by mouth daily. Patient not taking: No sig reported 11/18/20   [provider]  LORazepam (ATIVAN) 0.5 MG tablet Take 1 tablet by mouth every 6 (six) hours as needed. 11/07/20   [provider]  rosuvastatin (CRESTOR) 20 MG tablet Take 1 tablet (20 mg total) by mouth daily. 10/28/20 01/26/21  Jake Bathe, MD    Physical Exam: Vitals:   02/11/21 0946 02/11/21 1030 02/11/21 1230 02/11/21 1245  BP:  (!) 168/80 (!) 181/88 (!) 149/86  Pulse:  (!) 55 95 80  Resp:  20 16 12   Temp:      TempSrc:      SpO2:  100% 96% 96%  Weight: 53.1 kg     Height: 5\' 2"  (1.575 m)        General:  Appears calm and comfortable and is in NAD Eyes:  normal lids, iris;  left eye with corneal clouding and vision loss ENT:  grossly normal hearing, lips & tongue, mmm Neck:  no LAD, masses or thyromegaly Cardiovascular:  RRR, no m/r/g. No LE edema.  Respiratory:   CTA bilaterally with no wheezes/rales/rhonchi.  Normal respiratory effort. Abdomen:  soft, NT, ND Skin:  no rash or induration seen on limited  exam Musculoskeletal:  grossly normal tone BUE/RLE, left knee is in immobilizer and surrounded by ice packs with good distal circulation Psychiatric:  grossly normal mood and affect, speech fluent and appropriate, AOx3 Neurologic:  CN 2-12 grossly intact, moves all extremities in coordinated fashion other than LLE due to pain    Radiological Exams on Admission: Independently reviewed - see discussion in A/P where applicable  DG Knee Complete 4 Views Left  Result Date: 02/11/2021 CLINICAL DATA:  Fall directly onto left knee this morning. Diffuse knee pain. EXAM: LEFT KNEE - COMPLETE 4+ VIEW COMPARISON:  None. FINDINGS: Transverse fracture of the patella with 5 cm superior displacement of the superior fragment. Prepatellar soft tissue swelling is present. IMPRESSION: Transverse displaced fracture of the patella with approximately 5 cm superior displacement of the superior fragment. Electronically Signed   By: Acquanetta Belling M.D.   On: 02/11/2021 11:44    EKG: Independently reviewed.  NSR with rate 87; incomplete RBBB and LAFB that appear to be new since last tracing, no evidence of acute ischemia   Labs on Admission: I have personally reviewed the available labs and imaging studies at the time of the admission.  Pertinent labs:   Unremarkable BMP Normal CBC COVID/flu negative   Assessment/Plan Principal Problem:   Patellar fracture Active Problems:   Seizures (HCC)   Intractable chronic migraine without aura and without status migrainosus   Rheumatoid arthritis with positive rheumatoid factor (HCC)   Hypertension   Dyslipidemia   Patella fracture -Mechanical fall resulting in left patellar fracture -Orthopedics consulted -NPO after midnight in anticipation of surgical repair tomorrow -SCDs overnight, start Lovenox post-operatively (or as per ortho) -Pain control with Robxain and Fentanyl prn -SW consult for [facility] rehab  -Will need PT consult post-operatively  Epilepsy -Continue  Dilantin -Subtherapeutic level  RA -On Humira as an outpatient -Hold Arava, tacrolimus  HTN -Continue Norvasc, chlorthalidone, losartan, spironolactone  HLD -Continue Lipitor -Also on Repatha, Zetia as outpatient  Glaucoma -Residual L eye blindness -Continue Difluprednate, Restasis  Migraines -Hold Imitrex, Nurtec -On Botox, Aimovig -Continue Fiorcet prn    DVT prophylaxis:  SCDs until approved for Lovenox by orthopedics Code Status:  DNR - confirmed with patient/family Family Communication: Husband was present throughout evaluation Disposition Plan:  Home once clinically improved Consults called: Orthopedics; SW, Nutrition; will need PT post-operatively  Admission status: Admit - It is my clinical opinion that admission to INPATIENT is reasonable and necessary because of the expectation that this patient will require hospital care that crosses at least 2 midnights to treat this condition based on the medical complexity of the problems presented.  Given the aforementioned information, the predictability of an adverse outcome is felt to be significant.      Jonah Blue MD [facility]   How to contact the [facility] Attending or Consulting provider 7A - 7P or covering provider during after hours 7P -7A, for this patient?  Check the care team in [facility] and look for a) attending/consulting TR[facility] provider listed and b) the [facility] team listed Log into [email or web address] and use [place]'s universal password to access. If you do not have the password, please  contact the hospital operator. Locate the [facility] provider you are looking for under [facility] and page to a number that you can be directly reached. If you still have difficulty reaching the provider, please page the [facility] (Director on Call) for the Hospitalists listed on amion for assistance.   02/11/2021, 2:53 PM

## 2021-02-11 NOTE — Anesthesia Preprocedure Evaluation (Addendum)
Anesthesia Evaluation  Patient identified by MRN, date of birth, ID band Patient awake    Reviewed: Allergy & Precautions, NPO status , Patient's Chart, lab work & pertinent test results  Airway Mallampati: II  TM Distance: >3 FB Neck ROM: Full    Dental no notable dental hx.    Pulmonary former smoker,    Pulmonary exam normal breath sounds clear to auscultation       Cardiovascular hypertension, + Peripheral Vascular Disease (left carotid stenosis)  Normal cardiovascular exam Rhythm:Regular Rate:Normal     Neuro/Psych  Headaches, Seizures -,  Anxiety CVA    GI/Hepatic Neg liver ROS, GERD  ,  Endo/Other  raynauds  Renal/GU negative Renal ROS  negative genitourinary   Musculoskeletal  (+) Arthritis , Rheumatoid disorders,    Abdominal   Peds negative pediatric ROS (+)  Hematology negative hematology ROS (+)   Anesthesia Other Findings   Reproductive/Obstetrics                            Anesthesia Physical Anesthesia Plan  ASA: 3  Anesthesia Plan: General   Post-op Pain Management:    Induction: Intravenous  PONV Risk Score and Plan: 3 and Treatment may vary due to age or medical condition, Midazolam, Ondansetron and Dexamethasone  Airway Management Planned: LMA  Additional Equipment: None  Intra-op Plan:   Post-operative Plan: Extubation in OR  Informed Consent: I have reviewed the patients History and Physical, chart, labs and discussed the procedure including the risks, benefits and alternatives for the proposed anesthesia with the patient or authorized representative who has indicated his/her understanding and acceptance.   Patient has DNR.  Discussed DNR with patient and Suspend DNR.   Dental advisory given  Plan Discussed with: CRNA and Anesthesiologist  Anesthesia Plan Comments: ( GA/LMA. 1 PIV. Patient does NOT want to be DNR. She would want a full resuscitation  if needed for any cardiopulmonary events. Tanna Furry, MD  )     Anesthesia Quick Evaluation

## 2021-02-11 NOTE — Progress Notes (Signed)
Orthopedic Tech Progress Note Patient Details:  Brianna [name] 1950-11-07 [id number]  Ortho Devices Type of Ortho Device: Knee Immobilizer Ortho Device/Splint Location: LLE Ortho Device/Splint Interventions: Ordered, Application, Adjustment   Post Interventions Patient Tolerated: Well Instructions Provided: Care of device  Donald Pore 02/11/2021, 12:47 PM

## 2021-02-11 NOTE — Consult Note (Addendum)
ORTHOPAEDIC CONSULTATION  REQUESTING PHYSICIAN: Jonah Blue, MD  PCP:  Irena Reichmann, DO  Chief Complaint: Left knee pain after fall  HPI: Brianna [name] is a 70 y.o. female who complains of left knee pain after slipping on water on the floor which caused a fall directly onto her left knee.  Patient immediately could not move her knee.  She noted swelling and deformity at that time.  Patient does report she cannot see well out of her left eye due to glaucoma.   Patient is on Humira.  Patient lives at home with her husband.  She is a past medical history of epilepsy, HLD, CVA, RA, PVD, and hypertension. Past Medical History:  Diagnosis Date   Arthritis    in the neck   Carotid artery occlusion    Epilepsy (HCC)    GERD (gastroesophageal reflux disease)    Glaucoma    Hyperlipidemia    Hypertension    Keratoconus of both eyes 1981   Macular degeneration    Migraines    Peripheral vascular disease (HCC)    carotid occlusion surgery on left   Raynaud's disease    Retinal edema    Rheumatoid arthritis(714.0)    Stroke (HCC)    two mild strokes presumed from left carotid stenosis   Past Surgical History:  Procedure Laterality Date   CAROTID ARTERY ANGIOPLASTY  2008   Dr Madilyn Fireman   CAROTID ENDARTERECTOMY Left 11-15-2007   cataract extraction Right 04-2014   [facility]   CHOLECYSTECTOMY  1995   CORNEAL TRANSPLANT     x 5 ; steroid inj. 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   [facility]   CORNEAL TRANSPLANT Left 04/17/2019   EYE SURGERY Right 12/2016   cornea repair    EYE SURGERY Right 09/20/2017   cornea transplant    GLAUCOMA SURGERY Right 2018   TRIGGER FINGER RELEASE Right 05/16/2018   Procedure: RELEASE TRIGGER FINGER/A-1 PULLEY RIGHT THUMB;  Surgeon: Cindee Salt, MD;  Location: [facility];  Service: Orthopedics;  Laterality: Right;   Social History   Socioeconomic History   Marital status: Married    Spouse name:  [name]   Number of children: 2   Years of education: Bachelor   Highest education level: Not on file  Occupational History   Occupation: Runner, broadcasting/film/video   Occupation: [employer] TEACHER    Employer: [employer]    Comment: retired  Tobacco Use   Smoking status: Former    Packs/day: 0.50    Years: 14.00    Pack years: 7.00    Types: Cigarettes    Quit date: 07/19/1988    Years since quitting: 32.5   Smokeless tobacco: Never  Vaping Use   Vaping Use: Never used  Substance and Sexual Activity   Alcohol use: No   Drug use: No   Sexual activity: Not on file  Other Topics Concern   Not on file  Social History Narrative   Patient is married to [name]), has 2 children   Patient is right handed   Education level is Bachelor's degree   Caffeine consumption is about 2 cups daily   Lives at home with husband    Social Determinants of Corporate investment banker Strain: Not on file  Food Insecurity: Not on file  Transportation Needs: Not on file  Physical Activity: Not on file  Stress: Not on file  Social Connections: Not on file   Family History  Problem Relation Age  of Onset   Heart disease Mother    Lung disease Mother        ? disease process   Uterine cancer Mother    Heart disease Father    Clotting disorder Father    Collagen disease Father    Cervical cancer Maternal Aunt    Prostate cancer Maternal Grandfather    Rheum arthritis Sister    High Cholesterol Sister    Epilepsy Sister    Rheum arthritis Maternal Grandmother    High Cholesterol Brother    High blood pressure Brother    High blood pressure Brother    High Cholesterol Brother    High blood pressure Brother    High Cholesterol Brother    Allergies  Allergen Reactions   Morphine Other (See Comments)    Difficulty breathing   Morphine And Related Shortness Of Breath   Alphagan [Brimonidine]     Eyelid swelling, scratching Allergic to preservative contained in this med   Codeine Nausea And Vomiting    Dorzolamide Hcl-Timolol Mal     Inflammation of the eyelid, scratching Allergic to preservative contained in this med   Hydrocodone-Acetaminophen Nausea And Vomiting    But tolerates tylenol   Sulfa Antibiotics Rash    "Large bumps"   Sulfamethoxazole Rash   Prior to Admission medications   Medication Sig Start Date End Date Taking? Authorizing Provider  amLODipine (NORVASC) 10 MG tablet Take 10 mg by mouth daily. 10/24/19  Yes [provider]  Ascorbic Acid 500 MG CAPS Take by mouth.   Yes [provider]  aspirin 325 MG tablet Take 325 mg by mouth daily.   Yes [provider]  atorvastatin (LIPITOR) 40 MG tablet Take 40 mg by mouth daily.   Yes [provider]  botulinum toxin Type A (BOTOX) 100 units SOLR injection Inject IM into the head and neck muscles every 3 months by provider in office 02/26/16  Yes Anson Fret, MD  butalbital-acetaminophen-caffeine (FIORICET, ESGIC) 50-325-40 MG tablet Take 1 tablet by mouth every 6 (six) hours as needed for headache. 04/05/18  Yes Anson Fret, MD  Calcium Carbonate-Vit D-Min (CALCIUM 1200 PO) Take 2 capsules by mouth daily.   Yes [provider]  chlorthalidone (HYGROTON) 25 MG tablet Take 1 tablet (25 mg total) by mouth daily. 05/27/20 05/22/21 Yes Jake Bathe, MD  Cholecalciferol (VITAMIN D PO) Take 1,000 Units by mouth daily.    Yes [provider]  Difluprednate (DUREZOL) 0.05 % EMUL 5 drops in left eye and 2 in right eye daily.   Yes [provider]  DILANTIN 100 MG ER capsule TAKE 3 CAPSULES BY MOUTH DAILY 03/06/20  Yes Anson Fret, MD  Erenumab-aooe (AIMOVIG) 140 MG/ML SOAJ Inject 140 mg into the skin every 30 (thirty) days. 01/27/21  Yes Anson Fret, MD  Evolocumab (REPATHA SURECLICK) 140 MG/ML SOAJ Inject 1 pen into the skin every 14 (fourteen) days. 12/16/20  Yes Meriam Sprague, MD  ezetimibe (ZETIA) 10 MG tablet Take 1 tablet (10 mg total) by mouth daily.  12/16/20  Yes Meriam Sprague, MD  famotidine (PEPCID) 20 MG tablet TAKE 1 TABLET(20 MG) BY MOUTH AT BEDTIME 08/22/20  Yes Nyoka Cowden, MD  HUMIRA PEN 40 MG/0.4ML PNKT Inject 40 mg into the skin every 14 (fourteen) days. As directed 06/13/19  Yes [provider]  leflunomide (ARAVA) 20 MG tablet Take 20 mg by mouth daily.   Yes [provider]  LORazepam (ATIVAN) 0.5 MG tablet Take 0.5 mg by mouth daily as needed for anxiety or sleep.   Yes [provider]  losartan (COZAAR) 100 MG tablet Take 1 tablet (100 mg total) by mouth daily. 10/28/20  Yes Jake Bathe, MD  pantoprazole (PROTONIX) 40 MG tablet TAKE 1 TABLET(40 MG) BY MOUTH DAILY 11/10/20  Yes Nyoka Cowden, MD  promethazine (PHENERGAN) 25 MG tablet Take 1 tablet (25 mg total) by mouth every 6 (six) hours as needed for nausea or vomiting. 04/05/18  Yes Anson Fret, MD  RESTASIS 0.05 % ophthalmic emulsion 2 (two) times daily. 01/18/20  Yes [provider]  Rimegepant Sulfate (NURTEC) 75 MG TBDP Take 75 mg by mouth daily as needed. For migraines. Take as close to onset of migraine as possible. One daily maximum. 08/07/20  Yes Anson Fret, MD  spironolactone (ALDACTONE) 25 MG tablet Take 0.5 tablets (12.5 mg total) by mouth daily. 12/16/20 03/16/21 Yes Meriam Sprague, MD  SUMAtriptan (IMITREX) 6 MG/0.5ML SOLN injection Inject 0.5 mLs (6 mg total) into the skin every 2 (two) hours as needed for migraine or headache. Take one dose at headache onset, can take additional dose 2hrs later if needed. No more then 2 injections in 24hrs 12/01/20  Yes Anson Fret, MD  tacrolimus (PROGRAF) 1 MG capsule Take 1 mg by mouth 2 (two) times daily.   Yes [provider]  ascorbic Acid (VITAMIN C) 500 MG CPCR Take 500 mg by mouth daily.    [provider]  hypromellose (GENTEAL) 0.3 % GEL ophthalmic ointment as needed. 12/21/19   [provider]  rosuvastatin (CRESTOR) 20 MG tablet  Take 1 tablet (20 mg total) by mouth daily. 10/28/20 01/26/21  Jake Bathe, MD   DG Knee Complete 4 Views Left  Result Date: 02/11/2021 CLINICAL DATA:  Fall directly onto left knee this morning. Diffuse knee pain. EXAM: LEFT KNEE - COMPLETE 4+ VIEW COMPARISON:  None. FINDINGS: Transverse fracture of the patella with 5 cm superior displacement of the superior fragment. Prepatellar soft tissue swelling is present. IMPRESSION: Transverse displaced fracture of the patella with approximately 5 cm superior displacement of the superior fragment. Electronically Signed   By: Acquanetta Belling M.D.   On: 02/11/2021 11:44    Positive ROS: All other systems have been reviewed and were otherwise negative with the exception of those mentioned in the HPI and as above.  Physical Exam: Constitutional: General Appearance: healthy-appearing, well-nourished, and well-developed. Level of Distress: no acute distress. Eyes: Lens (normal) clear: both eyes. Head: Head: normocephalic and atraumatic. Lungs: Respiratory effort: no dyspnea. Skin: Inspection and palpation: no rash, lesions Neurologic: Cranial Nerves: grossly intact. Sensation: grossly intact. Psychiatric: Insight: good judgement and insight. Mental Status: normal mood and affect and active and alert. Orientation: to time, place, and person.   MUSCULOSKELETAL:  Left Knee  Inspection: Moderate swelling, no erythema  Palpation: global tenderness to the anterior knee.   Effusion: moderate  Palpable  patella fragments with defect   extensor mech is disrupted.  No calf swelling or tenderness  Distal ankle and foot strength normal Sensation is intact to light touch Extremity is warm and well perfused   Assessment: Left knee transverse patella fracture  Plan: Discussed with the patient that she does have a transverse patella fracture which has disrupted her extensor mechanism.  We discussed the necessity for open reduction internal fixation of the  left patella to restore the extensor mechanism.  Recommend knee  immobilizer until surgery tomorrow. Risk and benefits of surgery discussed and general post operative protocol.   ORIF of the left patella with Dr. Aundria Rud first case around 7:30am.   Patient requesting to use her own eye drops for glaucoma that are not on formulary here - Up to the primary team but seems appropriate.    Keep n.p.o. at midnight. DVT prophylaxis : SCD, Lovenox postop while inpatient  Ok for Fioricet for migraine tonight.   Weightbearing: Nonweightbearing to the left lower extremity   Arbie Cookey, PA    02/11/2021 4:22 PM

## 2021-02-11 NOTE — ED Provider Notes (Signed)
[facility] EMERGENCY DEPARTMENT Provider Note   CSN: [id number] Arrival date & time: 02/11/21  0941     History Chief Complaint  Patient presents with   [name]    Brianna [name] is a 70 y.o. female.  Patient with history of epilepsy, reflux, carotid occlusion, rheumatoid arthritis, pulmonary fibrosis presents with left knee pain and swelling.  Patient slipped on water this morning on a new kitchen floor leading her to hit hard on her knee.  Deformity and swelling.  No other injuries no head injuries or syncope.  Pain severe and constant.  No recent infectious symptoms.  Patient on aspirin no anticoagulants.      Past Medical History:  Diagnosis Date   Arthritis    in the neck   Carotid artery occlusion    Epilepsy (HCC)    GERD (gastroesophageal reflux disease)    Glaucoma    Hyperlipidemia    Hypertension    Keratoconus of both eyes 1981   Macular degeneration    Migraines    Peripheral vascular disease (HCC)    carotid occlusion surgery on left   Raynaud's disease    Retinal edema    Rheumatoid arthritis(714.0)    Stroke (HCC)    two mild strokes presumed from left carotid stenosis    Patient Active Problem List   Diagnosis Date Noted   Patellar fracture 02/11/2021   Anxiety 08/28/2020   Carotid artery disease (HCC) 08/28/2020   Late effects of cerebrovascular disease 08/28/2020   Macular degeneration 08/28/2020   Other long term (current) drug therapy 08/28/2020   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   Pure hypercholesterolemia 08/28/2020   Vitamin D deficiency 08/28/2020   Exertional chest pain 06/24/2020   Hypertension 05/12/2020   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   Acute urinary tract infection 11/13/2019   Epilepsy (HCC) 11/13/2019   Corneal ulcer of left eye 12/05/2018   Acute left eye pain 12/05/2018   Endophthalmitis, acute, left 12/05/2018   URI, acute 05/19/2018   Cerumen impaction 05/19/2018    Laceration of left middle finger without foreign body without damage to nail 11/23/2017   Trigger thumb, right thumb 10/05/2017   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   Numbness 04/25/2017   Pain 04/25/2017   History of macular degeneration 09/13/2016   History of migraine 09/06/2016   History of seizures 09/06/2016   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   Secondary corneal edema of right eye 06/28/2016   Iritis of right eye 05/12/2016   Rheumatoid arthritis with positive rheumatoid factor (HCC) 05/11/2016   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   High risk medication use 05/11/2016   Osteoporosis 05/11/2016   Intractable chronic migraine without aura and without status migrainosus 04/02/2016   Peripheral focal chorioretinal inflammation of both eyes 01/04/2016   Squamous blepharitis of upper and lower eyelids of both eyes 10/22/2015   Dry eyes, bilateral 10/21/2015   History of high risk medication treatment 04/28/2015   Cystoid macular edema of left eye 09/23/2014   History of carotid endarterectomy 06/27/2014   Acquired myogenic ptosis of both eyelids 06/07/2014   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     Family History  Problem Relation Age of Onset   Heart disease Mother    Lung disease Mother        ? disease process   Uterine cancer Mother    Heart disease Father    Clotting disorder Father    Collagen disease Father    Cervical cancer Maternal Aunt    Prostate cancer Maternal Grandfather    Rheum arthritis Sister    High Cholesterol Sister    Epilepsy Sister    Rheum arthritis Maternal Grandmother    High Cholesterol Brother    High blood pressure Brother    High blood pressure Brother    High Cholesterol Brother    High blood pressure Brother    High Cholesterol Brother     Social History   Tobacco Use   Smoking status: Former    Packs/day: 0.50    Years: 14.00    Pack years: 7.00    Types: Cigarettes    Quit date: 07/19/1988    Years since quitting: 32.5   Smokeless tobacco: Never  Vaping Use   Vaping Use: Never used  Substance Use Topics   Alcohol use: No   Drug use: No    Home Medications Prior to Admission medications   Medication Sig Start Date End Date Taking? Authorizing Provider  amLODipine (NORVASC) 10 MG tablet Take 10 mg by mouth daily. 10/24/19  Yes [provider]  Ascorbic Acid 500 MG CAPS Take by mouth.   Yes [provider]  aspirin 325 MG tablet Take 325 mg by mouth daily.   Yes [provider]  atorvastatin (LIPITOR) 40 MG tablet Take 40 mg by mouth daily.   Yes [provider]  botulinum toxin Type A (BOTOX) 100 units SOLR injection Inject IM into the head and neck muscles every 3 months by provider in office 02/26/16  Yes Anson Fret, MD  butalbital-acetaminophen-caffeine (FIORICET, ESGIC) 50-325-40 MG tablet Take 1 tablet by mouth every 6 (six) hours as needed for  headache. 04/05/18  Yes Anson Fret, MD  Calcium Carbonate-Vit D-Min (CALCIUM 1200 PO) Take 2 capsules by mouth daily.   Yes [provider]  chlorthalidone (HYGROTON) 25 MG tablet Take 1 tablet (25 mg total) by mouth daily. 05/27/20 05/22/21 Yes Jake Bathe, MD  Cholecalciferol (VITAMIN D PO) Take 1,000 Units by mouth daily.    Yes [provider]  Difluprednate (DUREZOL) 0.05 % EMUL 5 drops in left eye and 2 in right eye daily.   Yes [provider]  Difluprednate 0.05 % EMUL 6 drops. 4 drops in the left eye, 2 drops in the right eye 11/18/20  Yes [provider]  DILANTIN 100 MG ER capsule  TAKE 3 CAPSULES BY MOUTH DAILY 03/06/20  Yes Anson Fret, MD  Erenumab-aooe (AIMOVIG) 140 MG/ML SOAJ Inject 140 mg into the skin every 30 (thirty) days. 01/27/21  Yes Anson Fret, MD  Evolocumab (REPATHA SURECLICK) 140 MG/ML SOAJ Inject 1 pen into the skin every 14 (fourteen) days. 12/16/20  Yes Meriam Sprague, MD  ezetimibe (ZETIA) 10 MG tablet Take 1 tablet (10 mg total) by mouth daily. 12/16/20  Yes Meriam Sprague, MD  famotidine (PEPCID) 20 MG tablet TAKE 1 TABLET(20 MG) BY MOUTH AT BEDTIME 08/22/20  Yes Nyoka Cowden, MD  HUMIRA PEN 40 MG/0.4ML PNKT Inject 40 mg into the skin every 14 (fourteen) days. As directed 06/13/19  Yes [provider]  leflunomide (ARAVA) 20 MG tablet Take 20 mg by mouth daily.   Yes [provider]  LORazepam (ATIVAN) 0.5 MG tablet Take 0.5 mg by mouth daily as needed for anxiety or sleep.   Yes [provider]  losartan (COZAAR) 100 MG tablet Take 1 tablet (100 mg total) by mouth daily. 10/28/20  Yes Jake Bathe, MD  losartan (COZAAR) 50 MG tablet Take 50 mg by mouth daily. 12/23/20  Yes [provider]  pantoprazole (PROTONIX) 40 MG tablet TAKE 1 TABLET(40 MG) BY MOUTH DAILY 11/10/20  Yes Nyoka Cowden, MD  promethazine (PHENERGAN) 25 MG tablet Take 1 tablet (25 mg total) by mouth every  6 (six) hours as needed for nausea or vomiting. 04/05/18  Yes Anson Fret, MD  promethazine (PHENERGAN) 25 MG tablet Take by mouth.   Yes [provider]  RESTASIS 0.05 % ophthalmic emulsion 2 (two) times daily. 01/18/20  Yes [provider]  Rimegepant Sulfate (NURTEC) 75 MG TBDP Take 75 mg by mouth daily as needed. For migraines. Take as close to onset of migraine as possible. One daily maximum. 08/07/20  Yes Anson Fret, MD  spironolactone (ALDACTONE) 25 MG tablet Take 0.5 tablets (12.5 mg total) by mouth daily. 12/16/20 03/16/21 Yes Meriam Sprague, MD  SUMAtriptan (IMITREX) 6 MG/0.5ML SOLN injection Inject 0.5 mLs (6 mg total) into the skin every 2 (two) hours as needed for migraine or headache. Take one dose at headache onset, can take additional dose 2hrs later if needed. No more then 2 injections in 24hrs 12/01/20  Yes Anson Fret, MD  SUMAtriptan 6 MG/0.5ML SOAJ Inject 0.5 mLs into the skin as needed. 12/03/20  Yes [provider]  tacrolimus (PROGRAF) 1 MG capsule Take 1 mg by mouth 2 (two) times daily.   Yes [provider]  ascorbic Acid (VITAMIN C) 500 MG CPCR Take 500 mg by mouth daily.    [provider]  hypromellose (GENTEAL) 0.3 % GEL ophthalmic ointment as needed. 12/21/19   [provider]  leflunomide (ARAVA) 20 MG tablet Take 1 tablet by mouth daily. Patient not taking: No sig reported 11/18/20   [provider]  LORazepam (ATIVAN) 0.5 MG tablet Take 1 tablet by mouth every 6 (six) hours as needed. 11/07/20   [provider]  rosuvastatin (CRESTOR) 20 MG tablet Take 1 tablet (20 mg total) by mouth daily. 10/28/20 01/26/21  Jake Bathe, MD    Allergies    Morphine, Morphine and related, Alphagan [brimonidine], Codeine, Dorzolamide hcl-timolol mal, Hydrocodone-acetaminophen, Sulfa antibiotics, and Sulfamethoxazole  Review of Systems   Review of Systems  Constitutional:  Negative for chills and  fever.  HENT:  Negative for congestion.   Eyes:  Negative  for visual disturbance.  Respiratory:  Negative for shortness of breath.   Cardiovascular:  Negative for chest pain.  Gastrointestinal:  Negative for abdominal pain and vomiting.  Genitourinary:  Negative for dysuria and flank pain.  Musculoskeletal:  Positive for gait problem and joint swelling. Negative for back pain, neck pain and neck stiffness.  Skin:  Negative for rash.  Neurological:  Negative for light-headedness and headaches.   Physical Exam Updated Vital Signs BP (!) 149/86   Pulse 80   Temp 98.2 F (36.8 C) (Oral)   Resp 12   Ht 5\' 2"  (1.575 m)   Wt 53.1 kg   SpO2 96%   BMI 21.40 kg/m   Physical Exam Vitals and nursing note reviewed.  Constitutional:      General: She is not in acute distress.    Appearance: She is well-developed.  HENT:     Head: Normocephalic and atraumatic.     Mouth/Throat:     Mouth: Mucous membranes are moist.  Eyes:     General:        Right eye: No discharge.        Left eye: No discharge.     Conjunctiva/sclera: Conjunctivae normal.  Neck:     Trachea: No tracheal deviation.  Cardiovascular:     Rate and Rhythm: Normal rate and regular rhythm.     Heart sounds: No murmur heard. Pulmonary:     Effort: Pulmonary effort is normal.     Breath sounds: Normal breath sounds.  Abdominal:     General: There is no distension.     Palpations: Abdomen is soft.     Tenderness: There is no abdominal tenderness. There is no guarding.  Musculoskeletal:        General: Swelling, tenderness, deformity and signs of injury present.     Cervical back: Normal range of motion and neck supple. No rigidity.     Comments: Patient has moderate swelling erythema and ecchymosis anterior left patella and surrounding anterior knee region.  No tenderness to tibia or left ankle or foot, no tenderness to femur or left hip.  Patient unable to move due to pain.  Compartments soft distal to the injury  neurovascularly intact distal to the injury.  Skin:    General: Skin is warm.     Capillary Refill: Capillary refill takes less than 2 seconds.  Neurological:     General: No focal deficit present.     Mental Status: She is alert.     Cranial Nerves: No cranial nerve deficit.  Psychiatric:     Comments: anxious    ED Results / Procedures / Treatments   Labs (all labs ordered are listed, but only abnormal results are displayed) Labs Reviewed  BASIC METABOLIC PANEL - Abnormal; Notable for the following components:      Result Value   Glucose, Bld 108 (*)    All other components within normal limits  RESP PANEL BY RT-PCR (FLU A&B, COVID) ARPGX2  CBC WITH DIFFERENTIAL/PLATELET    EKG None  Radiology DG Knee Complete 4 Views Left  Result Date: 02/11/2021 CLINICAL DATA:  Fall directly onto left knee this morning. Diffuse knee pain. EXAM: LEFT KNEE - COMPLETE 4+ VIEW COMPARISON:  None. FINDINGS: Transverse fracture of the patella with 5 cm superior displacement of the superior fragment. Prepatellar soft tissue swelling is present. IMPRESSION: Transverse displaced fracture of the patella with approximately 5 cm superior displacement of the superior fragment. Electronically Signed   By: 02/13/2021  Mir M.D.   On: 02/11/2021 11:44    Procedures Procedures   Medications Ordered in ED Medications  fentaNYL (SUBLIMAZE) injection 50 mcg (50 mcg Intravenous Given 02/11/21 1100)  sodium chloride 0.9 % bolus 500 mL (500 mLs Intravenous New Bag/Given 02/11/21 1059)  fentaNYL (SUBLIMAZE) injection 50 mcg (50 mcg Intravenous Given 02/11/21 1235)    ED Course  I have reviewed the triage vital signs and the nursing notes.  Pertinent labs & imaging results that were available during my care of the patient were reviewed by me and considered in my medical decision making (see chart for details).    MDM Rules/Calculators/A&P                         Patient presents with mechanical fall and  significant swelling deformity left knee.  Clinical concern for patella fracture, discussed other differentials including proximal tibia fracture, meniscal or ligamentous injury.  Plan for general blood work, COVID for preop, pain meds and IV fluids.  Patient's pain improved on reassessment.  X-ray reviewed showing significant patella fracture with 5 cm displacement.  Patient unable to bear any weight, will be unable to function at home.  Discussed with orthopedic Dr. Victorino Dike who will see the patient.  Discussed with hospitalist for admission. Blood work reviewed no acute abnormalities, normal hemoglobin, normal kidney function, normal electrolytes.  Discussed knee immobilizer with Ortho technician.   Final Clinical Impression(s) / ED Diagnoses Final diagnoses:  Fall, initial encounter  Closed displaced fracture of left patella, unspecified fracture morphology, initial encounter    Rx / DC Orders ED Discharge Orders     None        Blane Ohara, MD 02/11/21 1302

## 2021-02-11 NOTE — ED Triage Notes (Signed)
Pt bib [facility] from home with complaints of fall and left knee pain. Pt slipped on water this am and fell onto Left knee. Denies LOC and hitting head. No blood thinner and no head trauma present. Left knee deformity noted with pulses present.  EMS vitals: 160/92 BP, 96 HR,18 R

## 2021-02-12 ENCOUNTER — Institutional Professional Consult (permissible substitution): Payer: Medicare PPO | Admitting: Neurology

## 2021-02-12 ENCOUNTER — Inpatient Hospital Stay (HOSPITAL_COMMUNITY): Payer: Medicare PPO | Admitting: Anesthesiology

## 2021-02-12 ENCOUNTER — Inpatient Hospital Stay (HOSPITAL_COMMUNITY): Payer: Medicare PPO

## 2021-02-12 ENCOUNTER — Encounter (HOSPITAL_COMMUNITY): Payer: Self-pay | Admitting: Internal Medicine

## 2021-02-12 ENCOUNTER — Encounter (HOSPITAL_COMMUNITY)
Admission: EM | Disposition: A | Discharge status: Home Health | Payer: Self-pay | Source: Home / Self Care | Attending: Internal Medicine

## 2021-02-12 DIAGNOSIS — S82002A Unspecified fracture of left patella, initial encounter for closed fracture: Secondary | ICD-10-CM | POA: Diagnosis not present

## 2021-02-12 HISTORY — PX: ORIF PATELLA: SHX5033

## 2021-02-12 LAB — CBC
HCT: 39 % (ref 36.0–46.0)
Hemoglobin: 12.4 g/dL (ref 12.0–15.0)
MCH: 28.9 pg (ref 26.0–34.0)
MCHC: 31.8 g/dL (ref 30.0–36.0)
MCV: 90.9 fL (ref 80.0–100.0)
Platelets: 215 10*3/uL (ref 150–400)
RBC: 4.29 MIL/uL (ref 3.87–5.11)
RDW: 13.6 % (ref 11.5–15.5)
WBC: 9.5 10*3/uL (ref 4.0–10.5)
nRBC: 0 % (ref 0.0–0.2)

## 2021-02-12 LAB — BASIC METABOLIC PANEL
Anion gap: 8 (ref 5–15)
BUN: 12 mg/dL (ref 8–23)
CO2: 30 mmol/L (ref 22–32)
Calcium: 9.4 mg/dL (ref 8.9–10.3)
Chloride: 97 mmol/L — ABNORMAL LOW (ref 98–111)
Creatinine, Ser: 0.62 mg/dL (ref 0.44–1.00)
GFR, Estimated: >60 mL/min (ref >60)
Glucose, Bld: 124 mg/dL — ABNORMAL HIGH (ref 70–99)
Potassium: 3.8 mmol/L (ref 3.5–5.1)
Sodium: 135 mmol/L (ref 135–145)

## 2021-02-12 LAB — HIV ANTIBODY (ROUTINE TESTING W REFLEX): HIV Screen 4th Generation wRfx: NONREACTIVE

## 2021-02-12 SURGERY — OPEN REDUCTION INTERNAL FIXATION (ORIF) PATELLA
Anesthesia: General | Site: Knee | Laterality: Left

## 2021-02-12 MED ORDER — ROSUVASTATIN CALCIUM 20 MG PO TABS: 20.0000 mg | ORAL_TABLET | Freq: Every day | ORAL | Status: DC

## 2021-02-12 MED ORDER — DOCUSATE SODIUM 100 MG PO CAPS: 100.0000 mg | ORAL_CAPSULE | Freq: Two times a day (BID) | ORAL | Status: DC

## 2021-02-12 MED ORDER — SUFENTANIL CITRATE 50 MCG/ML IV SOLN
INTRAVENOUS | Status: AC
  Filled 2021-02-12: qty 1

## 2021-02-12 MED ORDER — CEFAZOLIN SODIUM-DEXTROSE 2-3 GM-%(50ML) IV SOLR
INTRAVENOUS | Status: DC | PRN
  Administered 2021-02-12: 2 g via INTRAVENOUS

## 2021-02-12 MED ORDER — ENOXAPARIN SODIUM 40 MG/0.4ML IJ SOSY
40.0000 mg | PREFILLED_SYRINGE | INTRAMUSCULAR | Status: DC
  Administered 2021-02-13 – 2021-02-15 (×3): 40 mg via SUBCUTANEOUS
  Filled 2021-02-12 (×3): qty 0.4

## 2021-02-12 MED ORDER — ACETAMINOPHEN 10 MG/ML IV SOLN
INTRAVENOUS | Status: DC | PRN
  Administered 2021-02-12: 1000 mg via INTRAVENOUS

## 2021-02-12 MED ORDER — METOCLOPRAMIDE HCL 5 MG PO TABS: 5.0000 mg | ORAL_TABLET | Freq: Three times a day (TID) | ORAL | Status: DC | PRN

## 2021-02-12 MED ORDER — TRAMADOL HCL 50 MG PO TABS
50.0000 mg | ORAL_TABLET | Freq: Four times a day (QID) | ORAL | Status: DC | PRN
Start: 2021-02-12 — End: 2021-02-15
  Administered 2021-02-14 – 2021-02-15 (×5): 50 mg via ORAL
  Filled 2021-02-12 (×5): qty 1

## 2021-02-12 MED ORDER — EZETIMIBE 10 MG PO TABS
10.0000 mg | ORAL_TABLET | Freq: Every day | ORAL | Status: DC
  Administered 2021-02-12 – 2021-02-15 (×4): 10 mg via ORAL
  Filled 2021-02-12 (×4): qty 1

## 2021-02-12 MED ORDER — BUPIVACAINE-EPINEPHRINE (PF) 0.25% -1:200000 IJ SOLN
INTRAMUSCULAR | Status: AC
  Filled 2021-02-12: qty 30

## 2021-02-12 MED ORDER — PROMETHAZINE HCL 25 MG/ML IJ SOLN: 6.2500 mg | INTRAMUSCULAR | Status: DC | PRN

## 2021-02-12 MED ORDER — SUGAMMADEX SODIUM 200 MG/2ML IV SOLN
INTRAVENOUS | Status: DC | PRN
  Administered 2021-02-12: 200 mg via INTRAVENOUS

## 2021-02-12 MED ORDER — ONDANSETRON HCL 4 MG PO TABS: 4.0000 mg | ORAL_TABLET | Freq: Four times a day (QID) | ORAL | Status: DC | PRN

## 2021-02-12 MED ORDER — CEFAZOLIN SODIUM-DEXTROSE 1-4 GM/50ML-% IV SOLN
1.0000 g | Freq: Four times a day (QID) | INTRAVENOUS | Status: AC
Start: 2021-02-12 — End: 2021-02-12
  Administered 2021-02-12 (×2): 1 g via INTRAVENOUS
  Filled 2021-02-12 (×2): qty 50

## 2021-02-12 MED ORDER — ENSURE ENLIVE PO LIQD
237.0000 mL | Freq: Two times a day (BID) | ORAL | Status: DC
  Administered 2021-02-13 – 2021-02-15 (×5): 237 mL via ORAL

## 2021-02-12 MED ORDER — BUPIVACAINE-EPINEPHRINE 0.25% -1:200000 IJ SOLN
INTRAMUSCULAR | Status: DC | PRN
  Administered 2021-02-12: 15 mL

## 2021-02-12 MED ORDER — TACROLIMUS 1 MG PO CAPS
1.0000 mg | ORAL_CAPSULE | Freq: Two times a day (BID) | ORAL | Status: DC
  Administered 2021-02-12 – 2021-02-15 (×7): 1 mg via ORAL
  Filled 2021-02-12 (×8): qty 1

## 2021-02-12 MED ORDER — KETOROLAC TROMETHAMINE 15 MG/ML IJ SOLN
15.0000 mg | Freq: Four times a day (QID) | INTRAMUSCULAR | Status: DC | PRN
  Administered 2021-02-12 – 2021-02-14 (×6): 15 mg via INTRAVENOUS
  Filled 2021-02-12 (×6): qty 1

## 2021-02-12 MED ORDER — ACETAMINOPHEN 325 MG PO TABS
650.0000 mg | ORAL_TABLET | Freq: Four times a day (QID) | ORAL | Status: DC | PRN
  Administered 2021-02-14 – 2021-02-15 (×3): 650 mg via ORAL
  Filled 2021-02-12 (×3): qty 2

## 2021-02-12 MED ORDER — MIDAZOLAM HCL 2 MG/2ML IJ SOLN
INTRAMUSCULAR | Status: AC
  Filled 2021-02-12: qty 2

## 2021-02-12 MED ORDER — ADULT MULTIVITAMIN W/MINERALS CH
1.0000 | ORAL_TABLET | Freq: Every day | ORAL | Status: DC
  Administered 2021-02-13 – 2021-02-15 (×3): 1 via ORAL
  Filled 2021-02-12 (×4): qty 1

## 2021-02-12 MED ORDER — ROCURONIUM 10MG/ML (10ML) SYRINGE FOR MEDFUSION PUMP - OPTIME
INTRAVENOUS | Status: DC | PRN
  Administered 2021-02-12: 50 mg via INTRAVENOUS

## 2021-02-12 MED ORDER — LACTATED RINGERS IV SOLN: INTRAVENOUS | Status: DC | PRN

## 2021-02-12 MED ORDER — FENTANYL CITRATE (PF) 100 MCG/2ML IJ SOLN
INTRAMUSCULAR | Status: AC
  Filled 2021-02-12: qty 2

## 2021-02-12 MED ORDER — SUFENTANIL CITRATE 50 MCG/ML IV SOLN
INTRAVENOUS | Status: DC | PRN
  Administered 2021-02-12 (×2): 10 ug via INTRAVENOUS

## 2021-02-12 MED ORDER — MIDAZOLAM HCL 2 MG/2ML IJ SOLN
INTRAMUSCULAR | Status: DC | PRN
  Administered 2021-02-12: 2 mg via INTRAVENOUS

## 2021-02-12 MED ORDER — ONDANSETRON HCL 4 MG/2ML IJ SOLN
INTRAMUSCULAR | Status: DC | PRN
  Administered 2021-02-12: 4 mg via INTRAVENOUS

## 2021-02-12 MED ORDER — FENTANYL CITRATE (PF) 100 MCG/2ML IJ SOLN
25.0000 ug | INTRAMUSCULAR | Status: DC | PRN
  Administered 2021-02-12 (×4): 25 ug via INTRAVENOUS

## 2021-02-12 MED ORDER — 0.9 % SODIUM CHLORIDE (POUR BTL) OPTIME
TOPICAL | Status: DC | PRN
  Administered 2021-02-12: 1000 mL

## 2021-02-12 MED ORDER — PROPOFOL 10 MG/ML IV BOLUS
INTRAVENOUS | Status: AC
  Filled 2021-02-12: qty 20

## 2021-02-12 MED ORDER — LIDOCAINE HCL (CARDIAC) PF 100 MG/5ML IV SOSY
PREFILLED_SYRINGE | INTRAVENOUS | Status: DC | PRN
Start: 2021-02-12 — End: 2021-02-12
  Administered 2021-02-12: 100 mg via INTRAVENOUS

## 2021-02-12 MED ORDER — PROPOFOL 10 MG/ML IV BOLUS
INTRAVENOUS | Status: DC | PRN
  Administered 2021-02-12: 120 mg via INTRAVENOUS

## 2021-02-12 MED ORDER — AMISULPRIDE (ANTIEMETIC) 5 MG/2ML IV SOLN: 10.0000 mg | Freq: Once | INTRAVENOUS | Status: DC | PRN

## 2021-02-12 MED ORDER — ASPIRIN 325 MG PO TABS
325.0000 mg | ORAL_TABLET | Freq: Every day | ORAL | Status: DC
  Administered 2021-02-13 – 2021-02-15 (×3): 325 mg via ORAL
  Filled 2021-02-12 (×3): qty 1

## 2021-02-12 MED ORDER — SUMATRIPTAN SUCCINATE 6 MG/0.5ML ~~LOC~~ SOLN
6.0000 mg | SUBCUTANEOUS | Status: DC | PRN
  Filled 2021-02-12: qty 0.5

## 2021-02-12 MED ORDER — ONDANSETRON HCL 4 MG/2ML IJ SOLN
4.0000 mg | Freq: Four times a day (QID) | INTRAMUSCULAR | Status: DC | PRN
  Administered 2021-02-12: 4 mg via INTRAVENOUS
  Filled 2021-02-12: qty 2

## 2021-02-12 MED ORDER — BUPIVACAINE HCL (PF) 0.25 % IJ SOLN
INTRAMUSCULAR | Status: AC
  Filled 2021-02-12: qty 20

## 2021-02-12 MED ORDER — DEXAMETHASONE SODIUM PHOSPHATE 10 MG/ML IJ SOLN
INTRAMUSCULAR | Status: DC | PRN
Start: 2021-02-12 — End: 2021-02-12
  Administered 2021-02-12: 10 mg via INTRAVENOUS

## 2021-02-12 MED ORDER — METOCLOPRAMIDE HCL 5 MG/ML IJ SOLN: 5.0000 mg | Freq: Three times a day (TID) | INTRAMUSCULAR | Status: DC | PRN

## 2021-02-12 SURGICAL SUPPLY — 74 items
ADH SKN CLS APL DERMABOND .7 (GAUZE/BANDAGES/DRESSINGS) ×1
ALCOHOL 70% 16 OZ (MISCELLANEOUS) ×1 IMPLANT
APL PRP STRL LF DISP 70% ISPRP (MISCELLANEOUS) ×1
BAG COUNTER SPONGE SURGICOUNT (BAG) ×2 IMPLANT
BAG SPNG CNTER NS LX DISP (BAG) ×1
BIT DRILL 2.6 CANN (BIT) ×1 IMPLANT
BNDG COHESIVE 4X5 TAN STRL (GAUZE/BANDAGES/DRESSINGS) IMPLANT
BNDG COHESIVE 6X5 TAN STRL LF (GAUZE/BANDAGES/DRESSINGS) ×2 IMPLANT
BNDG ELASTIC 4X5.8 VLCR STR LF (GAUZE/BANDAGES/DRESSINGS) IMPLANT
BNDG ELASTIC 6X5.8 VLCR STR LF (GAUZE/BANDAGES/DRESSINGS) ×1 IMPLANT
CHLORAPREP W/TINT 26 (MISCELLANEOUS) ×1 IMPLANT
COVER SURGICAL LIGHT HANDLE (MISCELLANEOUS) ×2 IMPLANT
CUFF TOURN SGL QUICK 34 (TOURNIQUET CUFF) ×2
CUFF TOURN SGL QUICK 42 (TOURNIQUET CUFF) IMPLANT
CUFF TRNQT CYL 34X4.125X (TOURNIQUET CUFF) IMPLANT
DERMABOND ADVANCED (GAUZE/BANDAGES/DRESSINGS) ×1
DERMABOND ADVANCED .7 DNX12 (GAUZE/BANDAGES/DRESSINGS) IMPLANT
DRAPE C-ARM 42X72 X-RAY (DRAPES) ×1 IMPLANT
DRAPE C-ARMOR (DRAPES) ×1 IMPLANT
DRAPE IMP U-DRAPE 54X76 (DRAPES) ×2 IMPLANT
DRAPE INCISE IOBAN 66X45 STRL (DRAPES) ×1 IMPLANT
DRAPE U-SHAPE 47X51 STRL (DRAPES) IMPLANT
DRSG PAD ABDOMINAL 8X10 ST (GAUZE/BANDAGES/DRESSINGS) ×1 IMPLANT
DURAPREP 26ML APPLICATOR (WOUND CARE) ×1 IMPLANT
ELECT CAUTERY BLADE 6.4 (BLADE) ×1 IMPLANT
ELECT REM PT RETURN 9FT ADLT (ELECTROSURGICAL) ×2
ELECTRODE REM PT RTRN 9FT ADLT (ELECTROSURGICAL) ×1 IMPLANT
FIBERTAPE 2 W/STRL NDL 17 (SUTURE) ×1 IMPLANT
GAUZE SPONGE 4X4 12PLY STRL (GAUZE/BANDAGES/DRESSINGS) ×2 IMPLANT
GAUZE XEROFORM 5X9 LF (GAUZE/BANDAGES/DRESSINGS) IMPLANT
GLOVE SRG 8 PF TXTR STRL LF DI (GLOVE) ×2 IMPLANT
GLOVE SURG ENC MOIS LTX SZ7.5 (GLOVE) ×4 IMPLANT
GLOVE SURG UNDER POLY LF SZ8 (GLOVE) ×4
GOWN STRL REUS W/ TWL LRG LVL3 (GOWN DISPOSABLE) ×1 IMPLANT
GOWN STRL REUS W/ TWL XL LVL3 (GOWN DISPOSABLE) ×2 IMPLANT
GOWN STRL REUS W/TWL LRG LVL3 (GOWN DISPOSABLE) ×2
GOWN STRL REUS W/TWL XL LVL3 (GOWN DISPOSABLE) ×4
GUIDEWIRE 1.35MM  DUAL TROCAR (WIRE) ×4
GUIDEWIRE 1.35MM DUAL TROCAR (WIRE) IMPLANT
KIT BASIN OR (CUSTOM PROCEDURE TRAY) ×2 IMPLANT
KIT TURNOVER KIT B (KITS) ×2 IMPLANT
NDL HYPO 25GX1X1/2 BEV (NEEDLE) IMPLANT
NEEDLE HYPO 25GX1X1/2 BEV (NEEDLE) ×2 IMPLANT
NS IRRIG 1000ML POUR BTL (IV SOLUTION) ×2 IMPLANT
PACK ORTHO EXTREMITY (CUSTOM PROCEDURE TRAY) ×2 IMPLANT
PAD ARMBOARD 7.5X6 YLW CONV (MISCELLANEOUS) ×3 IMPLANT
PAD CAST 3X4 CTTN HI CHSV (CAST SUPPLIES) IMPLANT
PADDING CAST COTTON 3X4 STRL (CAST SUPPLIES)
PADDING CAST COTTON 6X4 STRL (CAST SUPPLIES) ×1 IMPLANT
PASSER SUT SWANSON 36MM LOOP (INSTRUMENTS) IMPLANT
SCREW 4X36MM CANN LO-PRO (Screw) ×2 IMPLANT
SPONGE T-LAP 18X18 ~~LOC~~+RFID (SPONGE) ×2 IMPLANT
STOCKINETTE IMPERVIOUS LG (DRAPES) ×2 IMPLANT
STRIP CLOSURE SKIN 1/2X4 (GAUZE/BANDAGES/DRESSINGS) ×1 IMPLANT
SUCTION FRAZIER HANDLE 10FR (MISCELLANEOUS) ×2
SUCTION TUBE FRAZIER 10FR DISP (MISCELLANEOUS) ×1 IMPLANT
SUT ETHIBOND 5 LR DA (SUTURE) ×3 IMPLANT
SUT ETHILON 2 0 FS 18 (SUTURE) IMPLANT
SUT ETHILON 3 0 PS 1 (SUTURE) IMPLANT
SUT FIBERWIRE #2 38 T-5 BLUE (SUTURE) ×2
SUT MNCRL AB 3-0 PS2 18 (SUTURE) ×1 IMPLANT
SUT VIC AB 0 CT1 27 (SUTURE) ×2
SUT VIC AB 0 CT1 27XBRD ANBCTR (SUTURE) IMPLANT
SUT VIC AB 2-0 CT1 27 (SUTURE) ×2
SUT VIC AB 2-0 CT1 TAPERPNT 27 (SUTURE) IMPLANT
SUTURE FIBERWR #2 38 T-5 BLUE (SUTURE) IMPLANT
SYR CONTROL 10ML LL (SYRINGE) ×1 IMPLANT
TOWEL GREEN STERILE (TOWEL DISPOSABLE) ×4 IMPLANT
TOWEL GREEN STERILE FF (TOWEL DISPOSABLE) ×2 IMPLANT
TRAY CATH INTERMITTENT SS 16FR (CATHETERS) ×1 IMPLANT
TUBE CONNECTING 12X1/4 (SUCTIONS) ×2 IMPLANT
UNDERPAD 30X36 HEAVY ABSORB (UNDERPADS AND DIAPERS) ×2 IMPLANT
WATER STERILE IRR 1000ML POUR (IV SOLUTION) ×2 IMPLANT
YANKAUER SUCT BULB TIP NO VENT (SUCTIONS) ×1 IMPLANT

## 2021-02-12 NOTE — Transfer of Care (Signed)
Immediate Anesthesia Transfer of Care Note  [name]  Procedure(s) Performed: OPEN REDUCTION INTERNAL (ORIF) FIXATION PATELLA (Left: Knee)  Patient Location: PACU  Anesthesia Type:General  Level of Consciousness: oriented, drowsy, patient cooperative and obtunded  Airway & Oxygen Therapy: Patient Spontanous Breathing and Patient connected to nasal cannula oxygen  Post-op Assessment: Report given to RN, Post -op Vital signs reviewed and stable and Patient moving all extremities X 4  Post vital signs: Reviewed and stable  Last Vitals:  Vitals Value Taken Time  BP 166/100 02/12/21 0915  Temp    Pulse 25 02/12/21 0917  Resp 19 02/12/21 0917  SpO2 55 % 02/12/21 0917  Vitals shown include unvalidated device data.  Last Pain:  Vitals:   02/12/21 0616  TempSrc:   PainSc: 4          Complications: No notable events documented.

## 2021-02-12 NOTE — Op Note (Signed)
02/12/2021  8:45 AM  PATIENT:  Brianna [name]    PRE-OPERATIVE DIAGNOSIS:  closed displaced left patella fracture  POST-OPERATIVE DIAGNOSIS:  Same  PROCEDURE:  OPEN REDUCTION INTERNAL (ORIF) FIXATION PATELLA  SURGEON:  Yolonda Kida, MD  PHYSICIAN ASSISTANT: Dion Saucier, PA-C, present and scrubbed throughout the case, critical for completion in a timely fashion, and for retraction, instrumentation, and closure.  ANESTHESIA:   General with local  PREOPERATIVE INDICATIONS:  Brianna [name] is a  70 y.o. female with a diagnosis of closed displaced left patella fracture who elected for surgical management in order to restore the function of the extensor mechanism.    The risks benefits and alternatives were discussed with the patient preoperatively including but not limited to the risks of infection, bleeding, nerve injury, cardiopulmonary complications, the need for revision surgery, hardware prominence, hardware failure, the need for hardware removal, nonunion, malunion, posttraumatic arthritis, stiffness, loss of strength and function, among others, and the patient was willing to proceed.  OPERATIVE IMPLANTS: Arthrex 4.0 mm cannulated screws x2 with a FiberTape cerclage suture going through the cannulated screws in a figure-of-eight cerclage fashion  OPERATIVE FINDINGS: Displaced patella fracture  OPERATIVE PROCEDURE: The patient was brought to the operating room and placed in the supine position. General anesthesia was administered. IV antibiotics were given. The lower extremity was prepped and draped in usual sterile fashion. The leg was elevated and exsanguinated and the tourniquet was inflated. Time out was performed.   Anterior incision was made over the patella and the fracture fragments identified and cleaned of hematoma. The retinaculum was torn on either side.  I reduced the fracture anatomically and held provisionally with a clamp and placed 2 guidewires for the  cannulated screws.  The lengths were measured, after being confirmed on C-arm, and then I placed the screws, taking care to make sure that there were threads only on the proximal segment, providing compression at the fracture site, and the tips were not prominent proximally.  C-arm used to confirm reduction and position of the screws, and once I was satisfied with this I then used a Keith needle through the screws bringing a  FiberTape in a figure-of-eight type fashion. This provided excellent secondary fixation. I left the screw lengths slightly short of the far cortex in order to minimize the risk for rupture of the FiberWire over the tip of the screws.  The wounds were irrigated copiously and the retinaculum repaired with #2 FiberWire followed by Vicryl for the subcutaneous tissue with subcuticular Monocryl for skin, Dermabond and sterile gauze for the skin. The wounds were also injected. A knee immobilizer was applied. The patient was awakened and returned to the PACU in stable and satisfactory condition. There were no complications.   Disposition: Ms. Trevathan will be touchdown weightbearing to the left lower extremity for the first 2 weeks.  She will then progress to 50% weightbearing for the next 4 weeks.  She will maintain the knee in full extension for the first 2 weeks.  We will transition her to a Bledsoe style brace before her discharge from this hospitalization.  She will return to the medicine service at this time.

## 2021-02-12 NOTE — Anesthesia Postprocedure Evaluation (Signed)
Anesthesia Post Note  [name]  Procedure(s) Performed: OPEN REDUCTION INTERNAL (ORIF) FIXATION PATELLA (Left: Knee)     Patient location during evaluation: PACU Anesthesia Type: General Level of consciousness: awake Pain management: pain level controlled Vital Signs Assessment: post-procedure vital signs reviewed and stable Respiratory status: spontaneous breathing and respiratory function stable Cardiovascular status: stable Postop Assessment: no apparent nausea or vomiting Anesthetic complications: no   No notable events documented.  Last Vitals:  Vitals:   02/12/21 1055 02/12/21 1109  BP:  (!) 166/89  Pulse: 84 84  Resp: 15 16  Temp: 36.9 C 36.8 C  SpO2: 95% 96%    Last Pain:  Vitals:   02/12/21 1109  TempSrc: Oral  PainSc:                  Mellody Dance

## 2021-02-12 NOTE — Progress Notes (Signed)
Orthopedic Tech Progress Note Patient Details:  Brianna [name] 17-Aug-1950 [id number] Called order into [facility] Patient ID: Brianna [name], female   DOB: [birth year], 71 y.o.   MRN: [id number]  Lovett Calender 02/12/2021, 9:33 AM

## 2021-02-12 NOTE — Evaluation (Signed)
Physical Therapy Evaluation Patient Details Name: Brianna [name] MRN: [id number] DOB: May 08, 1951 Today's Date: 02/12/2021   History of Present Illness  70 yo female presents to [facility] on 7/27 with fall and L patellar fx. 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   Clinical Impression  Pt presents with impaired balance, post-operative pain and weakness, and decreased activity tolerance vs baseline. Pt to benefit from acute PT to address deficits. Pt tolerated stand pivot to/from [facility] with contact guard, overall requring min guard to min assist for mobility. PT feels pt will progress well with mobility, needs to practice 3 steps and short-distance gait training tomorrow. PT to progress mobility as tolerated, and will continue to follow acutely.       Follow Up Recommendations Home health PT;Supervision for mobility/OOB    Equipment Recommendations  Rolling walker with 5" wheels;3in1 (PT);Wheelchair (measurements PT);Wheelchair cushion (measurements PT) (may progress away from w/c)    Recommendations for Other Services       Precautions / Restrictions Precautions Precautions: Fall Required Braces or Orthoses: Splint/Cast Splint/Cast: bedsloe brace locked in extension at all times Restrictions Weight Bearing Restrictions: Yes LLE Weight Bearing: Touchdown weight bearing Other Position/Activity Restrictions: order in op note      Mobility  Bed Mobility Overal bed mobility: Needs Assistance Bed Mobility: Supine to Sit;Sit to Supine     Supine to sit: Min assist Sit to supine: Min assist   General bed mobility comments: min assist for LLE lifting into/out of bed.    Transfers Overall transfer level: Needs assistance Equipment used: Rolling walker (2 wheeled) Transfers: Sit to/from [employer] Sit to Stand: Min guard Stand pivot transfers: Min guard       General transfer  comment: min guard for rise and pivot to/from BSC on pt's R, cues for TDWB only on LLE which pt maintained well.  Ambulation/Gait             General Gait Details: NT - too fatigued  Stairs            Wheelchair Mobility    Modified Rankin (Stroke Patients Only)       Balance Overall balance assessment: Needs assistance;History of Falls Sitting-balance support: No upper extremity supported Sitting balance-Leahy Scale: Fair     Standing balance support: Bilateral upper extremity supported;During functional activity Standing balance-Leahy Scale: Poor Standing balance comment: reliant on external support                             Pertinent Vitals/Pain Pain Assessment: 0-10 Pain Score: 9  Pain Location: LLE Pain Descriptors / Indicators: Sore;Discomfort;Grimacing Pain Intervention(s): Limited activity within patient's tolerance;Monitored during session;Repositioned    Home Living Family/patient expects to be discharged to:: Private residence Living Arrangements: Spouse/significant other Available Help at Discharge: Family;Available 24 hours/day Type of Home: House Home Access: Stairs to enter Entrance Stairs-Rails: Right Entrance Stairs-Number of Steps: 3 Home Layout: Able to live on main level with bedroom/bathroom;1/2 bath on main level Home Equipment: None      Prior Function Level of Independence: Independent               Hand Dominance   Dominant Hand: Right    Extremity/Trunk Assessment   Upper Extremity Assessment Upper Extremity Assessment: Overall WFL for tasks assessed    Lower Extremity Assessment Lower Extremity Assessment: Generalized weakness;LLE deficits/detail LLE Deficits /  Details: limited exam due to locked in extension    Cervical / Trunk Assessment Cervical / Trunk Assessment: Normal  Communication   Communication: No difficulties  Cognition Arousal/Alertness: Awake/alert Behavior During Therapy: WFL  for tasks assessed/performed Overall Cognitive Status: Within Functional Limits for tasks assessed                                 General Comments: tearful intermittently during session      General Comments      Exercises     Assessment/Plan    PT Assessment Patient needs continued PT services  PT Problem List Decreased strength;Decreased mobility;Decreased safety awareness;Decreased activity tolerance;Decreased balance;Decreased knowledge of use of DME;Pain;Decreased knowledge of precautions       PT Treatment Interventions DME instruction;Therapeutic exercise;Gait training;Balance training;Patient/family education;Therapeutic activities;Stair training;Functional mobility training    PT Goals (Current goals can be found in the Care Plan section)  Acute Rehab PT Goals Patient Stated Goal: go home PT Goal Formulation: With patient Time For Goal Achievement: 02/26/21 Potential to Achieve Goals: Good    Frequency Min 4X/week   Barriers to discharge        Co-evaluation               AM-PAC PT "6 Clicks" Mobility  Outcome Measure Help needed turning from your back to your side while in a flat bed without using bedrails?: A Little Help needed moving from lying on your back to sitting on the side of a flat bed without using bedrails?: A Little Help needed moving to and from a bed to a chair (including a wheelchair)?: A Little Help needed standing up from a chair using your arms (e.g., wheelchair or bedside chair)?: A Little Help needed to walk in hospital room?: A Lot Help needed climbing 3-5 steps with a railing? : A Lot 6 Click Score: 16    End of Session   Activity Tolerance: Patient tolerated treatment well Patient left: in bed;with call bell/phone within reach;with bed alarm set Nurse Communication: Mobility status PT Visit Diagnosis: Other abnormalities of gait and mobility (R26.89);Unsteadiness on feet (R26.81)    Time: 9450-3888 PT Time  Calculation (min) (ACUTE ONLY): 22 min   Charges:   PT Evaluation $PT Eval Low Complexity: 1 Low          Aylana Hirschfeld S, PT DPT Acute Rehabilitation Services Pager [phone]  Office [phone]   Tyrone Apple E Christain [place] 02/12/2021, 2:35 PM

## 2021-02-12 NOTE — H&P (Signed)
H&P update  The surgical history has been reviewed and remains accurate without interval change.  The patient was re-examined and patient's physiologic condition has not changed significantly in the last 30 days. The condition still exists that makes this procedure necessary. The treatment plan remains the same, without new options for care.  No new pharmacological allergies or types of therapy has been initiated that would change the plan or the appropriateness of the plan.  The patient and/or family understand the potential benefits and risks.  Ashlye Oviedo P. Aundria Rud, MD 02/12/2021 7:34 AM

## 2021-02-12 NOTE — Progress Notes (Signed)
Initial Nutrition Assessment  DOCUMENTATION CODES:   Not applicable  INTERVENTION:   -Once diet is advanced, add:   -Ensure Enlive po BID, each supplement provides 350 kcal and 20 grams of protein  -MVI with minerals daily  NUTRITION DIAGNOSIS:   Increased nutrient needs related to post-op healing as evidenced by estimated needs.  GOAL:   Patient will meet greater than or equal to 90% of their needs  MONITOR:   PO intake, Supplement acceptance, Diet advancement, Labs, Weight trends, Skin, I & O's  REASON FOR ASSESSMENT:   Consult Assessment of nutrition requirement/status  ASSESSMENT:   Brianna [name] is a 70 y.o. female with medical history significant of epilepsy; HLD; HTN; RA; PVD; and CVA presenting with a mechanical fall.   She reports that she slipped on water in the floor from her dog - she doesn't see out of her left eye and didn't see it.  She landed directly on her knee and could not move it immediately.  Pain is reasonably controlled at this time.  Pt admitted with lt patellar fracture.   Reviewed I/O's: +500 ml x 24 hours  Per orthopedics notes, plan for ORIF with lt patella today. Pt currently NPO for procedure.   Pt unavailable at time of visit. RD unable to obtain further nutrition-related history or complete nutrition-focused physical exam at this time.    Reviewed wt hx; pt has experienced a 3.3% wt loss over the past 6 months, which is not significant for time frame.   Pt with increased nutritional needs for post-operative healing and would benefit from addition of oral nutrition supplements.   Medications reviewed and colace, dilantin, and aldactone.   Labs reviewed.   Diet Order:   Diet Order             Diet NPO time specified  Diet effective midnight                   EDUCATION NEEDS:   No education needs have been identified at this time  Skin:  Skin Assessment: Skin Integrity Issues: Skin Integrity Issues::  Incisions Incisions: closed lt knee  Last BM:  Unknown  Height:   Ht Readings from Last 1 Encounters:  02/11/21 5\' 2"  (1.575 m)    Weight:   Wt Readings from Last 1 Encounters:  02/11/21 53.1 kg    Ideal Body Weight:  50 kg  BMI:  Body mass index is 21.4 kg/m.  Estimated Nutritional Needs:   Kcal:  1600-1800  Protein:  80-65 grams  Fluid:  > 1.6 L    02/13/21, RD, LDN, CDCES Registered Dietitian II Certified Diabetes Care and Education Specialist Please refer to [facility] for RD and/or RD on-call/weekend/after hours pager

## 2021-02-12 NOTE — Progress Notes (Signed)
[facility] Progress Note  [name]    WUJ:[id number]  DOA: 02/11/2021     Date of Service: the patient was seen and examined on 02/12/2021  Brief hospital course: Past medical history of HLD, HTN, RA, PVD, CVA.  Presents with mechanical fall.  Found to have patellar fracture.  Underwent surgery.  Currently plan is monitor for postop recovery.  Subjective: Pain present.  No nausea no vomiting no fever no chills.  No dizziness or lightheadedness.  Assessment and Plan: 1.  Patella fracture Orthopedic consult. Underwent surgery. Pain control per orthopedic. Patient has multiple allergies with narcotics.  Will monitor.  Currently tolerating fentanyl.  Anticipating tolerating tramadol.  Also Toradol.  2.  History of seizures Continue Dilantin. Check level.  3.  Rheumatoid arthritis On Humira. Currently on hold.  4.  Glaucoma Patient takes tacrolimus for eye. Will continue. Continue rest of the eyedrops as well.  5.  Hyperlipidemia On Crestor. Will continue.  Also continue Zetia.  6.  Migraine. Resuming home medication.  Scheduled Meds:  amLODipine  10 mg Oral Daily   [START ON 02/13/2021] aspirin  325 mg Oral Daily   chlorthalidone  25 mg Oral Daily   cycloSPORINE  1 drop Both Eyes BID   Difluprednate  2-5 drop Both Eyes Daily   docusate sodium  100 mg Oral BID   [START ON 02/13/2021] enoxaparin (LOVENOX) injection  40 mg Subcutaneous Q24H   ezetimibe  10 mg Oral Daily   famotidine  20 mg Oral QHS   [START ON 02/13/2021] feeding supplement  237 mL Oral BID BM   fentaNYL       losartan  100 mg Oral Daily   [START ON 02/13/2021] multivitamin with minerals  1 tablet Oral Daily   pantoprazole  40 mg Oral Daily   phenytoin  300 mg Oral Daily   spironolactone  12.5 mg Oral Daily   tacrolimus  1 mg Oral BID   Continuous Infusions:   ceFAZolin (ANCEF) IV 1 g (02/12/21 1135)   methocarbamol (ROBAXIN) IV     PRN Meds: acetaminophen, bisacodyl,  butalbital-acetaminophen-caffeine, fentaNYL (SUBLIMAZE) injection, ketorolac, LORazepam, methocarbamol **OR** methocarbamol (ROBAXIN) IV, ondansetron **OR** ondansetron (ZOFRAN) IV, polyethylene glycol, SUMAtriptan, traMADol  Body mass index is 21.4 kg/m.  Nutrition Problem: Increased nutrient needs Etiology: post-op healing     DVT Prophylaxis:   enoxaparin (LOVENOX) injection 40 mg Start: 02/13/21 0800 SCDs Start: 02/12/21 1115 SCDs Start: 02/11/21 1427    Advance goals of care discussion: Pt is Full code.  Family Communication: no family was present at bedside, at the time of interview.   Data Reviewed: I have personally reviewed and interpreted daily labs, tele strips, imaging. Sodium 135.  Creatinine stable.  Physical Exam:  General: Appear in mild distress, no Rash; Oral Mucosa Clear, moist. no Abnormal Neck Mass Or lumps, Conjunctiva normal  Cardiovascular: S1 and S2 Present, no Murmur, Respiratory: good respiratory effort, Bilateral Air entry present and CTA, no Crackles, no wheezes Abdomen: Bowel Sound present, Soft and no tenderness Extremities: no Pedal edema Neurology: alert and oriented to time, place, and person affect appropriate. no new focal deficit Gait not checked due to patient safety concerns  Vitals:   02/12/21 1045 02/12/21 1055 02/12/21 1109 02/12/21 1500  BP: (!) 153/84  (!) 166/89 134/70  Pulse: 86 84 84 96  Resp: 18 15 16 16   Temp:  98.4 F (36.9 C) 98.2 F (36.8 C) (!) 97.4 F (36.3 C)  TempSrc:  Oral Oral  SpO2: 94% 95% 96% 96%  Weight:      Height:        Disposition:  Status is: Inpatient  Remains inpatient appropriate because:Ongoing active pain requiring inpatient pain management  Dispo: The patient is from: Home              Anticipated d/c is to: Home              Patient currently is not medically stable to d/c.   Difficult to place patient No  Time spent: 35 minutes. I reviewed all nursing notes, pharmacy notes, vitals,  pertinent old records. I have discussed plan of care as described above with RN.  Author: Lynden Oxford, MD [facility] 02/12/2021 7:54 PM  To reach On-call, see care teams to locate the attending and reach out via [email or web address]. Between 7PM-7AM, please contact night-coverage If you still have difficulty reaching the attending provider, please page the [facility] (Director on Call) for [facility] on amion for assistance.

## 2021-02-12 NOTE — Brief Op Note (Signed)
02/11/2021 - 02/12/2021  8:44 AM  PATIENT:  Brianna [name]  70 y.o. female  PRE-OPERATIVE DIAGNOSIS:  closed displaced left patella fracture  POST-OPERATIVE DIAGNOSIS:  closed displaced left patella fracture  PROCEDURE:  Procedure(s): OPEN REDUCTION INTERNAL (ORIF) FIXATION PATELLA (Left)  SURGEON:  Surgeon(s) and Role:    * Yolonda Kida, MD - Primary  PHYSICIAN ASSISTANT: Dion Saucier, PA-C   ANESTHESIA:   local and general  EBL:  10 cc  BLOOD ADMINISTERED:none  DRAINS: none   LOCAL MEDICATIONS USED:  MARCAINE     SPECIMEN:  No Specimen  DISPOSITION OF SPECIMEN:  N/A  COUNTS:  YES  TOURNIQUET:  * Missing tourniquet times found for documented tourniquets in log: [id number] *  DICTATION: .Note written in EPIC  PLAN OF CARE: Discharge to home after PACU  PATIENT DISPOSITION:  PACU - hemodynamically stable.   Delay start of Pharmacological VTE agent (>24hrs) due to surgical blood loss or risk of bleeding: not applicable

## 2021-02-12 NOTE — Anesthesia Procedure Notes (Signed)
Procedure Name: Intubation Date/Time: 02/12/2021 7:46 AM Performed by: Claris Che, CRNA Pre-anesthesia Checklist: Emergency Drugs available, Patient identified, Suction available, Patient being monitored and Timeout performed Patient Re-evaluated:Patient Re-evaluated prior to induction Oxygen Delivery Method: Circle system utilized Preoxygenation: Pre-oxygenation with 100% oxygen Induction Type: IV induction and Inhalational induction with existing ETT Ventilation: Mask ventilation without difficulty Laryngoscope Size: Mac and 4 Grade View: Grade II Tube type: Oral Tube size: 7.5 mm Number of attempts: 1 Airway Equipment and Method: Stylet Placement Confirmation: ETT inserted through vocal cords under direct vision, positive ETCO2 and breath sounds checked- equal and bilateral Secured at: 22 cm Tube secured with: Tape Dental Injury: Teeth and Oropharynx as per pre-operative assessment

## 2021-02-12 NOTE — ED Notes (Signed)
Report given to [facility]. Patient enroute to [facility].

## 2021-02-12 NOTE — Plan of Care (Signed)

## 2021-02-12 NOTE — Progress Notes (Signed)
Occupational Therapy Evaluation Patient Details Name: Brianna [name] MRN: [id number] DOB: [birth year] Today's Date: 02/12/2021    History of Present Illness 70 yo female presents to [facility] on 7/27 with fall and L patellar fx. 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   Clinical Impression   Pt presents with above diagnosis. PTA pt PLOF living at home with husband mostly I with ADLs and IADLs. Pt is currently limited with safe ADL engagement due to pain, safety awareness, and limited dynamic/static stability. Pt will benefit from continued acute level OT to address safety awareness, AE education, functional transfers, HEP, and compensatory techniques to improve independence with ADLs prior to returning home to [facility] with support.     Follow Up Recommendations  Home health OT;Supervision - Intermittent    Equipment Recommendations  3 in 1 bedside commode    Recommendations for Other Services       Precautions / Restrictions Precautions Precautions: Fall Required Braces or Orthoses: Splint/Cast Splint/Cast: bedsloe brace locked in extension at all times Restrictions Weight Bearing Restrictions: Yes LLE Weight Bearing: Touchdown weight bearing Other Position/Activity Restrictions: order in op note      Mobility Bed Mobility Overal bed mobility: Needs Assistance Bed Mobility: Supine to Sit;Sit to Supine     Supine to sit: Min guard Sit to supine: Min guard   General bed mobility comments: decreased physical assistance required this session. Pt demonstrated good use of active assist to present LLE to EOB onto the floor.    Transfers Overall transfer level: Needs assistance Equipment used: Rolling walker (2 wheeled) Transfers: Sit to/from [employer] Transfers Sit to Stand: Min assist Stand pivot transfers: Min assist Squat pivot transfers: Min assist     General  transfer comment: min A to initiate sit to stand for squat pivot to [facility] and returning back to bed from stand pivot. Pt still requires cues for hand placement and sequencing to safely transfer. Cues to adhere to TDWB.    Balance Overall balance assessment: Needs assistance;History of Falls Sitting-balance support: No upper extremity supported Sitting balance-Leahy Scale: Fair     Standing balance support: Bilateral upper extremity supported;During functional activity Standing balance-Leahy Scale: Poor Standing balance comment: reliant on external support                           ADL either performed or assessed with clinical judgement   ADL Overall ADL's : Needs assistance/impaired Eating/Feeding: Set up;Sitting                       Toilet Transfer: Minimal assistance;Cueing for safety;Cueing for sequencing;Squat-pivot;Stand-pivot Toilet Transfer Details (indicate cue type and reason): Pt received with concerns for toileting. Pt demonstrated min A squat pivot to [facility] with RW for safety with cues required for hand placement. Pt then demonstrated stand  pivot from [facility] to bed with min A and cues to reach for seated surface. Toileting- Clothing Manipulation and Hygiene: Min guard;Sitting/lateral lean Toileting - Clothing Manipulation Details (indicate cue type and reason): Pt demonstrated seated toilet hygiene.     Functional mobility during ADLs: Minimal assistance;Cueing for safety;Cueing for sequencing;Rolling walker General ADL Comments: Pt educated for safety awareness and to continue asking for assistance with functional transfers for safety and to prevent falls.     Vision         Perception  Praxis      Pertinent Vitals/Pain Pain Assessment: 0-10 Pain Score: 8  Pain Location: LLE Pain Descriptors / Indicators: Sore;Discomfort;Grimacing Pain Intervention(s): Premedicated before session;Repositioned     Hand Dominance Right   Extremity/Trunk  Assessment Upper Extremity Assessment Upper Extremity Assessment: Overall WFL for tasks assessed   Lower Extremity Assessment Lower Extremity Assessment: Defer to PT evaluation LLE Deficits / Details: limited exam due to locked in extension   Cervical / Trunk Assessment Cervical / Trunk Assessment: Normal   Communication Communication Communication: No difficulties   Cognition Arousal/Alertness: Awake/alert Behavior During Therapy: WFL for tasks assessed/performed Overall Cognitive Status: Within Functional Limits for tasks assessed                                 General Comments: concerns for post acute rehab   General Comments  husband present at bedside.    Exercises     Shoulder Instructions      Home Living Family/patient expects to be discharged to:: Private residence Living Arrangements: Spouse/significant other Available Help at Discharge: Family;Available 24 hours/day Type of Home: House Home Access: Stairs to enter [employer] of Steps: 3 Entrance Stairs-Rails: Right Home Layout: Able to live on main level with bedroom/bathroom;1/2 bath on main level         Bathroom Toilet: Standard     Home Equipment: None          Prior Functioning/Environment Level of Independence: Independent                 OT Problem List: Decreased strength;Decreased activity tolerance;Impaired balance (sitting and/or standing);Decreased safety awareness;Decreased knowledge of use of DME or AE;Decreased knowledge of precautions;Pain      OT Treatment/Interventions: Self-care/ADL training;Therapeutic exercise;DME and/or AE instruction;Therapeutic activities;Patient/family education;Balance training    OT Goals(Current goals can be found in the care plan section) Acute Rehab OT Goals Patient Stated Goal: go home OT Goal Formulation: With patient Time For Goal Achievement: 02/26/21 Potential to Achieve Goals: Fair  OT Frequency: Min 2X/week    Barriers to D/C:            Co-evaluation              AM-PAC OT "6 Clicks" Daily Activity     Outcome Measure Help from another person eating meals?: None Help from another person taking care of personal grooming?: A Little Help from another person toileting, which includes using toliet, bedpan, or urinal?: A Little Help from another person bathing (including washing, rinsing, drying)?: A Little Help from another person to put on and taking off regular upper body clothing?: A Little Help from another person to put on and taking off regular lower body clothing?: A Lot 6 Click Score: 18   End of Session Equipment Utilized During Treatment: Gait belt;Rolling walker Nurse Communication: Mobility status;Weight bearing status  Activity Tolerance: Patient limited by pain Patient left: in bed;with call bell/phone within reach;with family/visitor present  OT Visit Diagnosis: Unsteadiness on feet (R26.81);Muscle weakness (generalized) (M62.81);Pain                Time: 5009-3818 OT Time Calculation (min): 18 min Charges:  OT General Charges $OT Visit: 1 Visit OT Evaluation $OT Eval Low Complexity: 1 Low  [place] Old, MSOT, OTR/L  Supplemental Rehabilitation Services  [phone]   Zigmund Daniel 02/12/2021, 3:14 PM

## 2021-02-13 ENCOUNTER — Encounter (HOSPITAL_COMMUNITY): Payer: Self-pay | Admitting: Orthopedic Surgery

## 2021-02-13 DIAGNOSIS — S82002A Unspecified fracture of left patella, initial encounter for closed fracture: Secondary | ICD-10-CM | POA: Diagnosis not present

## 2021-02-13 LAB — BASIC METABOLIC PANEL
Anion gap: 9 (ref 5–15)
BUN: 13 mg/dL (ref 8–23)
CO2: 29 mmol/L (ref 22–32)
Calcium: 8.8 mg/dL — ABNORMAL LOW (ref 8.9–10.3)
Chloride: 97 mmol/L — ABNORMAL LOW (ref 98–111)
Creatinine, Ser: 0.7 mg/dL (ref 0.44–1.00)
GFR, Estimated: >60 mL/min (ref >60)
Glucose, Bld: 136 mg/dL — ABNORMAL HIGH (ref 70–99)
Potassium: 3.6 mmol/L (ref 3.5–5.1)
Sodium: 135 mmol/L (ref 135–145)

## 2021-02-13 LAB — CBC
HCT: 35 % — ABNORMAL LOW (ref 36.0–46.0)
Hemoglobin: 11.5 g/dL — ABNORMAL LOW (ref 12.0–15.0)
MCH: 29 pg (ref 26.0–34.0)
MCHC: 32.9 g/dL (ref 30.0–36.0)
MCV: 88.2 fL (ref 80.0–100.0)
Platelets: 183 10*3/uL (ref 150–400)
RBC: 3.97 MIL/uL (ref 3.87–5.11)
RDW: 13.4 % (ref 11.5–15.5)
WBC: 11.8 10*3/uL — ABNORMAL HIGH (ref 4.0–10.5)
nRBC: 0 % (ref 0.0–0.2)

## 2021-02-13 LAB — PHENYTOIN LEVEL, TOTAL: Phenytoin Lvl: 10.4 ug/mL (ref 10.0–20.0)

## 2021-02-13 MED ORDER — CEFAZOLIN SODIUM-DEXTROSE 1-4 GM/50ML-% IV SOLN
1.0000 g | Freq: Once | INTRAVENOUS | Status: AC
  Administered 2021-02-13: 1 g via INTRAVENOUS

## 2021-02-13 MED ORDER — TRAMADOL HCL 50 MG PO TABS: 50.0000 mg | ORAL_TABLET | Freq: Four times a day (QID) | ORAL | 0 refills | Status: DC | PRN

## 2021-02-13 MED ORDER — METHOCARBAMOL 500 MG PO TABS: 500.0000 mg | ORAL_TABLET | Freq: Three times a day (TID) | ORAL | 0 refills | Status: DC | PRN

## 2021-02-13 MED ORDER — METHOCARBAMOL 500 MG PO TABS: 500.0000 mg | ORAL_TABLET | Freq: Four times a day (QID) | ORAL | 0 refills | Status: DC | PRN

## 2021-02-13 MED ORDER — POLYETHYLENE GLYCOL 3350 17 G PO PACK
17.0000 g | PACK | Freq: Every day | ORAL | Status: DC
  Administered 2021-02-13 – 2021-02-15 (×3): 17 g via ORAL
  Filled 2021-02-13 (×3): qty 1

## 2021-02-13 NOTE — Progress Notes (Signed)
   Subjective:  Brianna [name] is a 70 y.o. female, 1 Day Post-Op   s/p Procedure(s): OPEN REDUCTION INTERNAL (ORIF) FIXATION PATELLA   Patient reports pain as mild to moderate.  She reports that she has mild pain at rest but will have intermittent spurts of increased pain that is moderate to severe.  She reports the Toradol was helpful for pain.  She denies numbness or tingling.  Denies fever or chills.  Discussed that she does not feel super comfortable going home when it comes to ambulation and getting in the shower.  She spoke with physical therapy today and there was a recommendation for postacute rehab.  Objective:   VITALS:   Vitals:   02/13/21 0031 02/13/21 0300 02/13/21 0756 02/13/21 1506  BP: (!) 165/79 (!) 172/79 (!) 169/83 (!) 149/79  Pulse: 89 96 87 85  Resp: 16 16 16 17   Temp: 99 F (37.2 C) 98.7 F (37.1 C) 98.2 F (36.8 C) 99.3 F (37.4 C)  TempSrc: Oral Oral Oral Oral  SpO2: 93% 97% 97% 98%  Weight:      Height:       Left Lower Extremity:   Neurologically intact Neurovascular intact Sensation intact distally Intact pulses distally Dorsiflexion/Plantar flexion intact Incision: dressing C/D/I Compartment soft Calf soft TROM brace present.    Lab Results  Component Value Date   WBC 11.8 (H) 02/13/2021   HGB 11.5 (L) 02/13/2021   HCT 35.0 (L) 02/13/2021   MCV 88.2 02/13/2021   PLT 183 02/13/2021   BMET    Component Value Date/Time   NA 135 02/13/2021 0529   NA 140 12/16/2020 1033   K 3.6 02/13/2021 0529   CL 97 (L) 02/13/2021 0529   CO2 29 02/13/2021 0529   GLUCOSE 136 (H) 02/13/2021 0529   BUN 13 02/13/2021 0529   BUN 14 12/16/2020 1033   CREATININE 0.70 02/13/2021 0529   CREATININE 0.60 12/13/2018 1352   CALCIUM 8.8 (L) 02/13/2021 0529   GFRNONAA >60 02/13/2021 0529   GFRNONAA 95 12/13/2018 1352   GFRAA 107 05/26/2020 1607   GFRAA 110 12/13/2018 1352     Assessment/Plan: 1 Day Post-Op   Principal Problem:   Patellar  fracture Active Problems:   Seizures (HCC)   Intractable chronic migraine without aura and without status migrainosus   Rheumatoid arthritis with positive rheumatoid factor (HCC)   Hypertension   Dyslipidemia   Advance diet Up with therapy  Discharge per primary -  post acute rehab per  PT with recommended equipment of  3 in 1, walker, wheelchair per PT/OT Recommendations.   Weightbearing Status: touchdown weightbearing to the left lower extremity for the first 2 weeks.  She will then progress to 50% weightbearing for the next 4 weeks.  She will maintain the knee in full extension for the first 2 weeks.  DVT Prophylaxis : Lovenox inpatient, aspirin 325mg  outpatient - patient takes this at baseline.    Tramadol RX/robaxin sent to patients pharmacy for discharge in case she goes home, also printed in  case of discharge to post acute rehab.   Follow up at [facility] in 2 weeks. Instructions placed in AVS.   12/15/2018 Brianna [name] 02/13/2021, 3:40 PM  Oretha Caprice PA-C  Physician Assistant with Dr. 02/15/2021 [facility]

## 2021-02-13 NOTE — Discharge Instructions (Addendum)
ORTHOPEDIC DISCHARGE INSTRUCTIONS:   -Maintain your bandage for 4 days and then can take off and use a simple dry dressing such as gauze and an Ace wrap.  You can get your incision wet after 4 days but no soaking or submerging.  Pat dry when you get out of the shower. Ensure the leg is kept straight.   -Wear your brace at all times except for showering and leave the brace in extension with the leg straight.  -For pain ice the knee off and on every 30 minutes, take Tylenol (1000mg  every 8 hours) , and Aleve (two tablets every 12 hours) or ibuprofen.  For breakthrough pain take tramadol.   -Weightbearing: You can begin touchdown weightbearing to the left leg for the first 2 weeks.  This means that it is okay to put your toe down to help balance when moving with a walker or doing transfers. You are to keep the knee in full extension (completely straight ) for the first two weeks.  We will progress this and start therapy when you return to the office in 2 weeks.  -For blood clot prevention make sure you pump your ankles frequently throughout the day.  You will also take your normal 325 mg aspirin once a day.  You will take this medication  for the next 6 weeks.   -Call the [facility] office at [phone] to make an appointment to see Dr. [phone] or Aundria Rud PA-C 2 weeks out from from the date of surgery.

## 2021-02-13 NOTE — TOC CAGE-AID Note (Signed)
Transition of Care [facility]) - CAGE-AID Screening   Patient Details  Name: WOODROW DULSKI MRN: [id number] Date of Birth: 01-Nov-1950  Transition of Care [facility]) CM/SW Contact:    Yarielis Funaro C Tarpley-Carter, LCSWA Phone Number: 02/13/2021, 3:10 PM   Clinical Narrative: Pt participated in Cage-Aid.  Pt stated she did not use substance and/or ETOH.  Pt was not offered resources, due to no use of substance or ETOH.    Dejan Angert Tarpley-Carter, MSW, LCSW-A Pronouns:  She/Her/Hers [facility] Clinical Social Worker Direct Number:  [phone] [email or web address]   CAGE-AID Screening:    Have You Ever Felt You Ought to Cut Down on Your Drinking or Drug Use?: No Have People Annoyed You By [employer] Your Drinking Or Drug Use?: No Have You Felt Bad Or Guilty About Your Drinking Or Drug Use?: No Have You Ever Had a Drink or Used Drugs First Thing In The Morning to Steady Your Nerves or to Get Rid of a Hangover?: No CAGE-AID Score: 0  Substance Abuse Education Offered: No

## 2021-02-13 NOTE — Progress Notes (Signed)
Physical Therapy Treatment Patient Details Name: Brianna [name] MRN: [id number] DOB: 1950/08/19 Today's Date: 02/13/2021    History of Present Illness 70 yo female presents to [facility] on 7/27 with fall and L patellar fx. 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    PT Comments     Pt supine in bed.  Performed gt progression this pm.  Pt require min to min guard assistance.  Based on need for continued assistance due to weakness and pain will update recommendation for post acute rehab.  Spoke with MD who placed a consult for rehab.  Plan for continued PT follow up over weekend.    Follow Up Recommendations  Supervision for mobility/OOB (POST ACUTE REHAB)     Equipment Recommendations  Rolling walker with 5" wheels;3in1 (PT);Wheelchair (measurements PT);Wheelchair cushion (measurements PT);Hospital bed (may need PTAR transport home.)    Recommendations for Other Services       Precautions / Restrictions Precautions Precautions: Fall Required Braces or Orthoses: Splint/Cast Splint/Cast: bedsloe brace locked in extension at all times Restrictions Weight Bearing Restrictions: Yes LLE Weight Bearing: Touchdown weight bearing Other Position/Activity Restrictions: order in op note    Mobility  Bed Mobility Overal bed mobility: Needs Assistance Bed Mobility: Supine to Sit;Sit to Supine     Supine to sit: Min guard Sit to supine: Min guard   General bed mobility comments: used gt belt on brace to move LE to edge of bed.  Pt very painful and guarded when lowering limb.    Transfers Overall transfer level: Needs assistance Equipment used: Rolling walker (2 wheeled) Transfers: Sit to/from Stand Sit to Stand: Min assist         General transfer comment: Cues for hand placement to and from seated surface.  Ambulation/Gait Ambulation/Gait assistance: Min guard Gait Distance (Feet): 20  Feet Assistive device: Rolling walker (2 wheeled) Gait Pattern/deviations: Step-to pattern;Trunk flexed;Antalgic     General Gait Details: Cues for technique in RW for hop to pattern.  pt maintaining weight bearing well.  Pt fatigues quickly with gt training.   Stairs             Wheelchair Mobility    Modified Rankin (Stroke Patients Only)       Balance Overall balance assessment: Needs assistance;History of Falls Sitting-balance support: No upper extremity supported Sitting balance-Leahy Scale: Fair       Standing balance-Leahy Scale: Poor Standing balance comment: reliant on external support                            Cognition Arousal/Alertness: Awake/alert Behavior During Therapy: WFL for tasks assessed/performed Overall Cognitive Status: Within Functional Limits for tasks assessed                                 General Comments: concerns for post acute rehab      Exercises      General Comments        Pertinent Vitals/Pain Pain Assessment: 0-10 Pain Score: 5  Pain Location: LLE Pain Descriptors / Indicators: Sore;Discomfort;Grimacing Pain Intervention(s): Monitored during session;Repositioned    Home Living                      Prior Function            PT Goals (current  goals can now be found in the care plan section) Acute Rehab PT Goals Patient Stated Goal: go home Potential to Achieve Goals: Good Progress towards PT goals: Progressing toward goals    Frequency    Min 4X/week      PT Plan Discharge plan needs to be updated    Co-evaluation              AM-PAC PT "6 Clicks" Mobility   Outcome Measure  Help needed turning from your back to your side while in a flat bed without using bedrails?: A Little Help needed moving from lying on your back to sitting on the side of a flat bed without using bedrails?: A Little Help needed moving to and from a bed to a chair (including a  wheelchair)?: A Little Help needed standing up from a chair using your arms (e.g., wheelchair or bedside chair)?: A Little Help needed to walk in hospital room?: A Lot Help needed climbing 3-5 steps with a railing? : A Lot 6 Click Score: 16    End of Session Equipment Utilized During Treatment:  (L bledsoe brace.) Activity Tolerance: Patient tolerated treatment well Patient left: in bed;with call bell/phone within reach;with bed alarm set Nurse Communication: Mobility status PT Visit Diagnosis: Other abnormalities of gait and mobility (R26.89);Unsteadiness on feet (R26.81)     Time: 0867-6195 PT Time Calculation (min) (ACUTE ONLY): 24 min  Charges:  $Gait Training: 8-22 mins $Therapeutic Activity: 8-22 mins                     Bonney Leitz , PTA Acute Rehabilitation Services Pager [phone] Office [phone]    Brianna [name] Artis Delay 02/13/2021, 4:52 PM

## 2021-02-13 NOTE — Progress Notes (Signed)
[facility] Progress Note  [name]    NAT:[id number]  DOA: 02/11/2021     Date of Service: the patient was seen and examined on 02/13/2021  Brief hospital course: Past medical history of HLD, HTN, RA, PVD, CVA.  Presents with mechanical fall.  Found to have patellar fracture.  Underwent surgery.  Currently plan is monitor for postop recovery and arrange for safe discharge.  Subjective: Pain still present.  No nausea no vomiting but no fever no chills.  Passing gas.  Had a small BM.  Abdomen discomfort with  Assessment and Plan: 1.  Patella fracture Orthopedic consult. Underwent surgery. Pain control per orthopedic. Patient has multiple allergies with narcotics.  Will monitor.  Currently tolerating fentanyl. Tolerating tramadol.  Also Toradol. Touchdown weightbearing for first 2 weeks then progress to 50% weightbearing for the next 4 weeks.  Maintain the knee in full extension for at least 2 weeks Lovenox inpatient for DVT prophylaxis, aspirin 325 mg outpatient  2.  History of seizures Continue Dilantin. Dilantin level adequate.  3.  Rheumatoid arthritis On Humira. Currently on hold.  4.  Glaucoma Patient takes tacrolimus for eye. Will continue. Continue rest of the eyedrops as well.  5.  Hyperlipidemia On Crestor. Will continue.  Also continue Zetia.  6.  Migraine. Resuming home medication.  7.  Postop blood loss anemia. Baseline hemoglobin around 13.  Currently hemoglobin 11.5.  Likely postop acute blood loss anemia.  Monitor for now.  Disposition PT recommends home health with supervision for mobility.  Family and patient requesting to go to CIR.  We will request screening although it does not appear that the patient will require CIR level care.  Scheduled Meds:  amLODipine  10 mg Oral Daily   aspirin  325 mg Oral Daily   chlorthalidone  25 mg Oral Daily   cycloSPORINE  1 drop Both Eyes BID   Difluprednate  2-5 drop Both Eyes Daily    docusate sodium  100 mg Oral BID   enoxaparin (LOVENOX) injection  40 mg Subcutaneous Q24H   ezetimibe  10 mg Oral Daily   famotidine  20 mg Oral QHS   feeding supplement  237 mL Oral BID BM   losartan  100 mg Oral Daily   multivitamin with minerals  1 tablet Oral Daily   pantoprazole  40 mg Oral Daily   phenytoin  300 mg Oral Daily   polyethylene glycol  17 g Oral Daily   spironolactone  12.5 mg Oral Daily   tacrolimus  1 mg Oral BID   Continuous Infusions:  methocarbamol (ROBAXIN) IV     PRN Meds: acetaminophen, bisacodyl, butalbital-acetaminophen-caffeine, fentaNYL (SUBLIMAZE) injection, ketorolac, LORazepam, methocarbamol **OR** methocarbamol (ROBAXIN) IV, ondansetron **OR** ondansetron (ZOFRAN) IV, SUMAtriptan, traMADol  Body mass index is 21.4 kg/m.  Nutrition Problem: Increased nutrient needs Etiology: post-op healing     DVT Prophylaxis:   enoxaparin (LOVENOX) injection 40 mg Start: 02/13/21 0800 SCDs Start: 02/12/21 1115 SCDs Start: 02/11/21 1427    Advance goals of care discussion: Pt is Full code.  Family Communication: no family was present at bedside, at the time of interview.   Data Reviewed: I have personally reviewed and interpreted daily labs, tele strips, imaging. Electrolytes stable.  Serum creatinine is stable.  Physical Exam:  General: Appear in mild distress, no Rash; Oral Mucosa Clear, moist. no Abnormal Neck Mass Or lumps, Conjunctiva normal  Cardiovascular: S1 and S2 Present, no Murmur, Respiratory: good respiratory effort, Bilateral Air entry present  and CTA, no Crackles, no wheezes Abdomen: Bowel Sound present, Soft and mild abdominal discomfort, no tenderness Extremities: no Pedal edema Neurology: alert and oriented to time, place, and person affect appropriate. no new focal deficit Gait not checked due to patient safety concerns  Vitals:   02/13/21 0031 02/13/21 0300 02/13/21 0756 02/13/21 1506  BP: (!) 165/79 (!) 172/79 (!) 169/83 (!)  149/79  Pulse: 89 96 87 85  Resp: 16 16 16 17   Temp: 99 F (37.2 C) 98.7 F (37.1 C) 98.2 F (36.8 C) 99.3 F (37.4 C)  TempSrc: Oral Oral Oral Oral  SpO2: 93% 97% 97% 98%  Weight:      Height:        Disposition:  Status is: Inpatient  Remains inpatient appropriate because:Ongoing active pain requiring inpatient pain management  Dispo: The patient is from: Home              Anticipated d/c is to: Home              Patient currently is not medically stable to d/c.   Difficult to place patient No  Time spent: 35 minutes. I reviewed all nursing notes, pharmacy notes, vitals, pertinent old records. I have discussed plan of care as described above with RN.  Author: , MD [facility] 02/13/2021 6:55 PM  To reach On-call, see care teams to locate the attending and reach out via [email or web address]/15/2021. Between 7PM-7AM, please contact night-coverage If you still have difficulty reaching the attending provider, please page the [facility] (Director on Call) for [facility] on amion for assistance.

## 2021-02-13 NOTE — Care Management Important Message (Signed)
Important Message  Patient Details  Name: Brianna [name] MRN: [id number] Date of Birth: [birth year]   Medicare Important Message Given:  Yes     Mardene Sayer 02/13/2021, 5:20 PM

## 2021-02-14 DIAGNOSIS — S82002A Unspecified fracture of left patella, initial encounter for closed fracture: Secondary | ICD-10-CM | POA: Diagnosis not present

## 2021-02-14 LAB — CBC
HCT: 33.9 % — ABNORMAL LOW (ref 36.0–46.0)
Hemoglobin: 11.4 g/dL — ABNORMAL LOW (ref 12.0–15.0)
MCH: 29.9 pg (ref 26.0–34.0)
MCHC: 33.6 g/dL (ref 30.0–36.0)
MCV: 89 fL (ref 80.0–100.0)
Platelets: 191 10*3/uL (ref 150–400)
RBC: 3.81 MIL/uL — ABNORMAL LOW (ref 3.87–5.11)
RDW: 13.5 % (ref 11.5–15.5)
WBC: 11.6 10*3/uL — ABNORMAL HIGH (ref 4.0–10.5)
nRBC: 0 % (ref 0.0–0.2)

## 2021-02-14 NOTE — Progress Notes (Signed)
Physical Therapy Treatment Patient Details Name: Brianna [name] MRN: [id number] DOB: 1951-07-09 Today's Date: 02/14/2021    History of Present Illness 70 yo female presents to [facility] on 7/27 with fall and L patellar fx. 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    PT Comments    Pt tolerates treatment well, progressing ambulation distances and tolerating stair training. Pt is able to bump up steps on buttocks with assistance of PT as documented below. Pt demonstrates no need for physical assistance during bed mobility and transfers. Pt will benefit from continued acute PT services to improve tolerance for gait training and to progress stair training to manage a full flight of stairs. PT updates recommendations back to HHPT. Pt will also benefit from a RW and 3in1 commode.   Follow Up Recommendations  Home health PT;Supervision for mobility/OOB     Equipment Recommendations  Rolling walker with 5" wheels;3in1 (PT) (family reports pt has rented hospital bed and wheelchair)    Recommendations for Other Services       Precautions / Restrictions Precautions Precautions: Fall Required Braces or Orthoses: Other Brace Splint/Cast: bedsloe brace locked in extension at all times Restrictions Weight Bearing Restrictions: Yes LLE Weight Bearing: Touchdown weight bearing    Mobility  Bed Mobility Overal bed mobility: Needs Assistance Bed Mobility: Supine to Sit     Supine to sit: Supervision     General bed mobility comments: pt utilizing gait belt as leg lifter    Transfers Overall transfer level: Needs assistance Equipment used: Rolling walker (2 wheeled) Transfers: Sit to/from [employer] Sit to Stand: Supervision Stand pivot transfers: Supervision          Ambulation/Gait Ambulation/Gait assistance: Min guard Gait Distance (Feet): 50 Feet (50' x 2, additional trials of  10', 20' x 2) Assistive device: Rolling walker (2 wheeled) Gait Pattern/deviations:  (hop-to gait) Gait velocity: reduced Gait velocity interpretation: <1.8 ft/sec, indicate of risk for recurrent falls General Gait Details: pt preferring hop-to gait due to discomfort with TWDB. Pt hops for 5-10 feet at a time, taking brief standing rest breaks in between.   Stairs Stairs: Yes Stairs assistance: Min assist Stair Management: One rail Left;Backwards (bumping up steps on buttocks) Number of Stairs: 4 General stair comments: pt bumps up 4-5 steps on buttocks, requiring verbal cues to lower herself onto step and minA to power back up into standing from bottom step   Wheelchair Mobility    Modified Rankin (Stroke Patients Only)       Balance Overall balance assessment: Needs assistance Sitting-balance support: No upper extremity supported;Feet supported Sitting balance-Leahy Scale: Good     Standing balance support: Single extremity supported;Bilateral upper extremity supported Standing balance-Leahy Scale: Poor Standing balance comment: reliant on external support                            Cognition Arousal/Alertness: Awake/alert Behavior During Therapy: WFL for tasks assessed/performed Overall Cognitive Status: Within Functional Limits for tasks assessed                                 General Comments: anxious with initiation of stair training but otherwise calm      Exercises      General Comments General comments (skin integrity, edema, etc.): VSS on RA  Pertinent Vitals/Pain Pain Assessment: Faces Faces Pain Scale: Hurts little more Pain Location: LLE Pain Descriptors / Indicators: Grimacing Pain Intervention(s): Monitored during session    Home Living                      Prior Function            PT Goals (current goals can now be found in the care plan section) Acute Rehab PT Goals Patient Stated Goal: go  home Progress towards PT goals: Progressing toward goals    Frequency    Min 4X/week      PT Plan Discharge plan needs to be updated    Co-evaluation              AM-PAC PT "6 Clicks" Mobility   Outcome Measure  Help needed turning from your back to your side while in a flat bed without using bedrails?: A Little Help needed moving from lying on your back to sitting on the side of a flat bed without using bedrails?: A Little Help needed moving to and from a bed to a chair (including a wheelchair)?: A Little Help needed standing up from a chair using your arms (e.g., wheelchair or bedside chair)?: A Little Help needed to walk in hospital room?: A Little Help needed climbing 3-5 steps with a railing? : A Lot 6 Click Score: 17    End of Session Equipment Utilized During Treatment: Gait belt Activity Tolerance: Patient tolerated treatment well Patient left: in chair;with call bell/phone within reach;with family/visitor present Nurse Communication: Mobility status PT Visit Diagnosis: Other abnormalities of gait and mobility (R26.89);Unsteadiness on feet (R26.81)     Time: 6546-5035 PT Time Calculation (min) (ACUTE ONLY): 69 min  Charges:  $Gait Training: 23-37 mins $Therapeutic Activity: 23-37 mins                     Arlyss Gandy, PT, DPT Acute Rehabilitation Pager: [phone]    Arlyss Gandy 02/14/2021, 3:06 PM

## 2021-02-14 NOTE — Progress Notes (Signed)
[facility] Progress Note  [name]    XBJ:[id number]  DOA: 02/11/2021     Date of Service: the patient was seen and examined on 02/14/2021  Brief hospital course: Past medical history of HLD, HTN, RA, PVD, CVA.  Presents with mechanical fall.  Found to have patellar fracture.  Underwent surgery.  Currently plan is monitor for postop recovery and final PT evaluation tomorrow.  Subjective: Pain improving.  No nausea no vomiting.  Had a BM.  Assessment and Plan: 1.  Patella fracture Orthopedic consult. Underwent surgery. Pain control per orthopedic. Patient has multiple allergies with narcotics.  Will monitor.  Currently tolerating fentanyl. Tolerating tramadol.  Also Toradol. Touchdown weightbearing for first 2 weeks then progress to 50% weightbearing for the next 4 weeks.  Maintain the knee in full extension for at least 2 weeks Lovenox inpatient for DVT prophylaxis, aspirin 325 mg outpatient. Patient requesting CIR although not able candidate for CAR and therefore will go home with home health tomorrow.  2.  History of seizures Continue Dilantin. Dilantin level adequate.  3.  Rheumatoid arthritis On Humira. Currently on hold.  4.  Glaucoma Patient takes tacrolimus for eye. Will continue. Continue rest of the eyedrops as well.  5.  Hyperlipidemia On Crestor. Will continue.  Also continue Zetia.  6.  Migraine. Resuming home medication.  7.  Postop blood loss anemia. Baseline hemoglobin around 13.  Currently hemoglobin 11.5.  Likely postop acute blood loss anemia.  Monitor for now.  Disposition Home with home health tomorrow after repeat PT evaluation.  Scheduled Meds:  amLODipine  10 mg Oral Daily   aspirin  325 mg Oral Daily   chlorthalidone  25 mg Oral Daily   cycloSPORINE  1 drop Both Eyes BID   Difluprednate  2-5 drop Both Eyes Daily   docusate sodium  100 mg Oral BID   enoxaparin (LOVENOX) injection  40 mg Subcutaneous Q24H   ezetimibe   10 mg Oral Daily   famotidine  20 mg Oral QHS   feeding supplement  237 mL Oral BID BM   losartan  100 mg Oral Daily   multivitamin with minerals  1 tablet Oral Daily   pantoprazole  40 mg Oral Daily   phenytoin  300 mg Oral Daily   polyethylene glycol  17 g Oral Daily   spironolactone  12.5 mg Oral Daily   tacrolimus  1 mg Oral BID   Continuous Infusions:  methocarbamol (ROBAXIN) IV     PRN Meds: acetaminophen, bisacodyl, butalbital-acetaminophen-caffeine, fentaNYL (SUBLIMAZE) injection, ketorolac, LORazepam, methocarbamol **OR** methocarbamol (ROBAXIN) IV, ondansetron **OR** ondansetron (ZOFRAN) IV, SUMAtriptan, traMADol  Body mass index is 21.4 kg/m.  Nutrition Problem: Increased nutrient needs Etiology: post-op healing     DVT Prophylaxis:   enoxaparin (LOVENOX) injection 40 mg Start: 02/13/21 0800 SCDs Start: 02/12/21 1115 SCDs Start: 02/11/21 1427    Advance goals of care discussion: Pt is Full code.  Family Communication: no family was present at bedside, at the time of interview.   Data Reviewed: I have personally reviewed and interpreted daily labs, tele strips, imaging. Hemoglobin stable after surgery.  Physical Exam:  General: Appear in mild distress, no Rash; Oral Mucosa Clear, moist. no Abnormal Neck Mass Or lumps, Conjunctiva normal  Cardiovascular: S1 and S2 Present, no Murmur, Respiratory: good respiratory effort, Bilateral Air entry present and CTA, no Crackles, no wheezes Abdomen: Bowel Sound present, Soft and no tenderness Extremities: no Pedal edema Neurology: alert and oriented to time, place,  and person affect appropriate. no new focal deficit Gait not checked due to patient safety concerns  Vitals:   02/13/21 1506 02/13/21 2001 02/14/21 0833 02/14/21 1940  BP: (!) 149/79 136/72 (!) 158/80 115/70  Pulse: 85 89 89 87  Resp: 17 17 18 16   Temp: 99.3 F (37.4 C) 98.6 F (37 C) 98.2 F (36.8 C) 98 F (36.7 C)  TempSrc: Oral Oral Oral Oral   SpO2: 98% 98% 100% 95%  Weight:      Height:        Disposition:  Status is: Inpatient  Remains inpatient appropriate because:Ongoing active pain requiring inpatient pain management  Dispo: The patient is from: Home              Anticipated d/c is to: Home              Patient currently is not medically stable to d/c.   Difficult to place patient No  Time spent: 35 minutes. I reviewed all nursing notes, pharmacy notes, vitals, pertinent old records. I have discussed plan of care as described above with RN.  Author: , MD [facility] 02/14/2021 8:22 PM  To reach On-call, see care teams to locate the attending and reach out via [email or web address]. Between 7PM-7AM, please contact night-coverage If you still have difficulty reaching the attending provider, please page the [facility] (Director on Call) for [facility] on amion for assistance.

## 2021-02-14 NOTE — Plan of Care (Signed)
Patient doing well with mobility to bathroom Problem: Education: Goal: Knowledge of General Education information will improve Description: Including pain rating scale, medication(s)/side effects and non-pharmacologic comfort measures Outcome: Progressing   Problem: Health Behavior/Discharge Planning: Goal: Ability to manage health-related needs will improve Outcome: Progressing   Problem: Clinical Measurements: Goal: Ability to maintain clinical measurements within normal limits will improve Outcome: Progressing

## 2021-02-14 NOTE — Progress Notes (Signed)
Inpatient Rehab Admissions Coordinator:    Pt. Was screened at request of MD due to family requesting CIR. At this time, Pt.'s dx of patellar fracture does not meet CIR's medical necessity criteria. In addition, PT and OT are both recommending HH, indicating Pt. Also does not demonstrate need for 3 hours of intensive therapy on CIR. I will not pursue CIR consult for this Pt.  Megan Salon, MS, CCC-SLP Rehab Admissions Coordinator  [phone] (celll) [phone] (office)

## 2021-02-14 NOTE — Plan of Care (Signed)
OOB w/ walker. C/o pain, trying tramadol and tylenol today. Ice packs applied. Possible discharge home tomorrow.   Problem: Activity: Goal: Risk for activity intolerance will decrease Outcome: Progressing   Problem: Nutrition: Goal: Adequate nutrition will be maintained Outcome: Progressing   Problem: Elimination: Goal: Will not experience complications related to bowel motility Outcome: Progressing Goal: Will not experience complications related to urinary retention Outcome: Progressing   Problem: Pain Managment: Goal: General experience of comfort will improve Outcome: Progressing   Problem: Safety: Goal: Ability to remain free from injury will improve Outcome: Progressing   Problem: Skin Integrity: Goal: Risk for impaired skin integrity will decrease Outcome: Progressing

## 2021-02-14 NOTE — Progress Notes (Signed)
Occupational Therapy Treatment Patient Details Name: Brianna [name] MRN: [id number] DOB: [birth year] Today's Date: 02/14/2021    History of present illness 70 yo female presents to [facility] on 7/27 with fall and L patellar fx. 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   OT comments  Pam is progressing well towards her OT goals. Session focused on compensatory techniques for ADLs. Pt was set up for all bathing tasks while sitting EOB with use of lateral leans. She demonstrated great sit<>stands 4x throughout session with min guard for safety and ambulated to bathroom with rw given min guard and gentle encouragement. Pt continues to display some impulsivity with ambulation and transfers, likely due to anxiety and pain. She maintain WB precautions very well. Pt continues to benefit from OT acutely. D/c plan remains appropriate.    Follow Up Recommendations  Home health OT;Supervision - Intermittent    Equipment Recommendations  3 in 1 bedside commode       Precautions / Restrictions Precautions Precautions: Fall Required Braces or Orthoses: Splint/Cast Splint/Cast: bedsloe brace locked in extension at all times Restrictions Weight Bearing Restrictions: Yes LLE Weight Bearing: Touchdown weight bearing       Mobility Bed Mobility Overal bed mobility: Needs Assistance Bed Mobility: Supine to Sit     Supine to sit: Supervision     General bed mobility comments: pt abel to use gait belt as leg lifter to manage LLE    Transfers Overall transfer level: Needs assistance Equipment used: Rolling walker (2 wheeled) Transfers: Sit to/from Stand Sit to Stand: Min guard         General transfer comment: Pt with great hand placement for sit<>Stand transfers. min guard for safety    Balance Overall balance assessment: Needs assistance;History of Falls Sitting-balance support: No upper extremity  supported Sitting balance-Leahy Scale: Fair     Standing balance support: Bilateral upper extremity supported;During functional activity Standing balance-Leahy Scale: Poor                             ADL either performed or assessed with clinical judgement   ADL Overall ADL's : Needs assistance/impaired     Grooming: Set up;Oral care;Sitting Grooming Details (indicate cue type and reason): Sitting in recliner Upper Body Bathing: Set up;Sitting;Cueing for safety Upper Body Bathing Details (indicate cue type and reason): Sitting EOB Lower Body Bathing: Set up;Sitting/lateral leans Lower Body Bathing Details (indicate cue type and reason): Sitting EOB Upper Body Dressing : Set up;Sitting Upper Body Dressing Details (indicate cue type and reason): Sitting EOB     Toilet Transfer: Min guard;Ambulation;RW;Comfort height toilet;Grab bars   Toileting- Clothing Manipulation and Hygiene: Supervision/safety;Sitting/lateral lean       Functional mobility during ADLs: Min guard;Cueing for safety;Rolling walker General ADL Comments: Pt completed bathing, dressing, and toileting this session with set up/min guard for all tasks. Pt able to bathe real perineal with lateral leans and ambulated to the bathroom for toileting needs with max encouragement and min guard for safety. With ambualtion pt demonstrates mild safety awareness impairments and can be impulsive due to pain and anxiety, however does well wtih gentle encouragement.     Vision   Vision Assessment?: Vision impaired- to be further tested in functional context          Cognition Arousal/Alertness: Awake/alert Behavior During Therapy: WFL for tasks assessed/performed Overall Cognitive Status: Within Functional Limits  for tasks assessed             General Comments: anxious for incrased mobility distances              General Comments VSS on RA, pt completed toileting needs this session, RN aware.     Pertinent Vitals/ Pain       Pain Assessment: Faces Faces Pain Scale: Hurts even more Pain Location: LLE Pain Descriptors / Indicators: Sore;Discomfort;Grimacing Pain Intervention(s): Limited activity within patient's tolerance;Monitored during session   Frequency  Min 2X/week        Progress Toward Goals  OT Goals(current goals can now be found in the care plan section)  Progress towards OT goals: Progressing toward goals  Acute Rehab OT Goals Patient Stated Goal: go home OT Goal Formulation: With patient Time For Goal Achievement: 02/26/21 Potential to Achieve Goals: Fair ADL Goals Pt Will Perform Grooming: with set-up;sitting Pt Will Perform Lower Body Dressing: with min guard assist;sit to/from stand;with adaptive equipment Pt Will Transfer to Toilet: with supervision;bedside commode Pt Will Perform Toileting - Clothing Manipulation and hygiene: with set-up;sit to/from stand;with adaptive equipment;sitting/lateral leans  Plan Discharge plan remains appropriate       AM-PAC OT "6 Clicks" Daily Activity     Outcome Measure   Help from another person eating meals?: None Help from another person taking care of personal grooming?: A Little Help from another person toileting, which includes using toliet, bedpan, or urinal?: A Little Help from another person bathing (including washing, rinsing, drying)?: A Little Help from another person to put on and taking off regular upper body clothing?: A Little Help from another person to put on and taking off regular lower body clothing?: A Lot 6 Click Score: 18    End of Session Equipment Utilized During Treatment: Gait belt;Rolling walker  OT Visit Diagnosis: Unsteadiness on feet (R26.81);Muscle weakness (generalized) (M62.81);Pain   Activity Tolerance Patient tolerated treatment well   Patient Left in chair;with call bell/phone within reach   Nurse Communication Mobility status;Weight bearing status (pt urinated, & pt  requests assistance for ordering lunch)        Time: 7026-3785 OT Time Calculation (min): 31 min  Charges: OT General Charges $OT Visit: 1 Visit OT Treatments $Self Care/Home Management : 23-37 mins     Annette Bertelson A Minnie Shi 02/14/2021, 12:14 PM

## 2021-02-15 DIAGNOSIS — S82002A Unspecified fracture of left patella, initial encounter for closed fracture: Secondary | ICD-10-CM | POA: Diagnosis not present

## 2021-02-15 LAB — TACROLIMUS LEVEL: Tacrolimus (FK506) - LabCorp: 1 ng/mL — ABNORMAL LOW (ref 2.0–20.0)

## 2021-02-15 LAB — BASIC METABOLIC PANEL
Anion gap: 9 (ref 5–15)
BUN: 19 mg/dL (ref 8–23)
CO2: 30 mmol/L (ref 22–32)
Calcium: 8.4 mg/dL — ABNORMAL LOW (ref 8.9–10.3)
Chloride: 96 mmol/L — ABNORMAL LOW (ref 98–111)
Creatinine, Ser: 0.52 mg/dL (ref 0.44–1.00)
GFR, Estimated: >60 mL/min (ref >60)
Glucose, Bld: 108 mg/dL — ABNORMAL HIGH (ref 70–99)
Potassium: 3.1 mmol/L — ABNORMAL LOW (ref 3.5–5.1)
Sodium: 135 mmol/L (ref 135–145)

## 2021-02-15 MED ORDER — POTASSIUM CHLORIDE CRYS ER 20 MEQ PO TBCR
40.0000 meq | EXTENDED_RELEASE_TABLET | ORAL | Status: AC
Start: 2021-02-15 — End: 2021-02-15
  Administered 2021-02-15 (×2): 40 meq via ORAL
  Filled 2021-02-15 (×2): qty 2

## 2021-02-15 MED ORDER — POLYETHYLENE GLYCOL 3350 17 G PO PACK: 17.0000 g | PACK | Freq: Every day | ORAL | 0 refills | Status: DC

## 2021-02-15 MED ORDER — ENSURE ENLIVE PO LIQD: 237.0000 mL | Freq: Two times a day (BID) | ORAL | 0 refills | Status: DC

## 2021-02-15 MED ORDER — DOCUSATE SODIUM 100 MG PO CAPS: 100.0000 mg | ORAL_CAPSULE | Freq: Two times a day (BID) | ORAL | 0 refills | Status: DC

## 2021-02-15 NOTE — Plan of Care (Signed)
Discharge instructions went over with Patient and family. Two copies given to family. All questions answered. IV d/c. Waiting for walker and BSC.   Problem: Increased Nutrient Needs (NI-5.1) Goal: Food and/or nutrient delivery Description: Individualized approach for food/nutrient provision. Outcome: Adequate for Discharge   Problem: Acute Rehab PT Goals(only PT should resolve) Goal: Pt Will Go Supine/Side To Sit Outcome: Adequate for Discharge Goal: Patient Will Transfer Sit To/From Stand Outcome: Adequate for Discharge Goal: Pt Will Transfer Bed To Chair/Chair To Bed Outcome: Adequate for Discharge Goal: Pt Will Ambulate Outcome: Adequate for Discharge Goal: Pt Will Go Up/Down Stairs Outcome: Adequate for Discharge Goal: Pt Will Verbalize and Adhere to Precautions While Description: PT Will Verbalize and Adhere to Precautions While Performing Mobility Outcome: Adequate for Discharge Goal: Pt/caregiver will Perform Home Exercise Program Outcome: Adequate for Discharge   Problem: Acute Rehab OT Goals (only OT should resolve) Goal: Pt. Will Perform Grooming Outcome: Adequate for Discharge Goal: Pt. Will Perform Lower Body Dressing Outcome: Adequate for Discharge Goal: Pt. Will Transfer To Toilet Outcome: Adequate for Discharge Goal: Pt. Will Perform Toileting-Clothing Manipulation Outcome: Adequate for Discharge   Problem: Education: Goal: Knowledge of General Education information will improve Description: Including pain rating scale, medication(s)/side effects and non-pharmacologic comfort measures Outcome: Adequate for Discharge   Problem: Health Behavior/Discharge Planning: Goal: Ability to manage health-related needs will improve Outcome: Adequate for Discharge   Problem: Clinical Measurements: Goal: Ability to maintain clinical measurements within normal limits will improve Outcome: Adequate for Discharge Goal: Will remain free from infection Outcome: Adequate for  Discharge Goal: Diagnostic test results will improve Outcome: Adequate for Discharge Goal: Respiratory complications will improve Outcome: Adequate for Discharge Goal: Cardiovascular complication will be avoided Outcome: Adequate for Discharge   Problem: Activity: Goal: Risk for activity intolerance will decrease Outcome: Adequate for Discharge   Problem: Nutrition: Goal: Adequate nutrition will be maintained Outcome: Adequate for Discharge   Problem: Coping: Goal: Level of anxiety will decrease Outcome: Adequate for Discharge   Problem: Elimination: Goal: Will not experience complications related to bowel motility Outcome: Adequate for Discharge Goal: Will not experience complications related to urinary retention Outcome: Adequate for Discharge   Problem: Pain Managment: Goal: General experience of comfort will improve Outcome: Adequate for Discharge   Problem: Safety: Goal: Ability to remain free from injury will improve Outcome: Adequate for Discharge   Problem: Skin Integrity: Goal: Risk for impaired skin integrity will decrease Outcome: Adequate for Discharge

## 2021-02-15 NOTE — Progress Notes (Signed)
Physical Therapy Treatment Patient Details Name: Brianna [name] MRN: [id number] DOB: March 21, 1951 Today's Date: 02/15/2021    History of Present Illness 70 yo female presents to [facility] on 7/27 with fall and L patellar fx. 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    PT Comments    Pt tolerates treatment well with improved ambulation distances. Pt is able to bump up increased number of stairs and performs floor transfer with assistance and use of railing. PT provides education on safety methods for use of gait belt and alternatives to stair negotiation including being carried up stairs in wheelchair with 2 person assist. Pt will benefit from continued HHPT at the time of discharge to aide in home setup and improving activity tolerance and stair negotiation. Pt will follow-up for outpatient PT when cleared by surgeon.   Follow Up Recommendations  Home health PT;Supervision for mobility/OOB     Equipment Recommendations  Rolling walker with 5" wheels;3in1 (PT) (pt has access to wheelchair and hospital bed)    Recommendations for Other Services       Precautions / Restrictions Precautions Precautions: Fall Required Braces or Orthoses: Other Brace Splint/Cast: bedsloe brace locked in extension at all times Restrictions Weight Bearing Restrictions: Yes LLE Weight Bearing: Touchdown weight bearing    Mobility  Bed Mobility                    Transfers Overall transfer level: Needs assistance Equipment used: Rolling walker (2 wheeled) Transfers: Sit to/from Stand Sit to Stand: Supervision;Mod assist         General transfer comment: cues for proximity to surface before initiating stand to sit. Sit to stand from floor when simulating stair training requiring modA and use of railing  Ambulation/Gait Ambulation/Gait assistance: Min guard Gait Distance (Feet): 60 Feet (additonal trials of 15'  x w) Assistive device: Rolling walker (2 wheeled) Gait Pattern/deviations:  (hop-to gait) Gait velocity: reduced Gait velocity interpretation: <1.8 ft/sec, indicate of risk for recurrent falls General Gait Details: pt with short hop-to gait, taking multiple brief standing rest breaks due to fatigue   Stairs Stairs: Yes Stairs assistance: Min assist Stair Management: One rail Left (bumping up on buttocks) Number of Stairs: 12 General stair comments: pt requires minA to slowly lower down to seated position on steps, bumps up 12 steps with support of LLE for comfort, requires modA to stand from seated position on floor at top of stairs   Wheelchair Mobility    Modified Rankin (Stroke Patients Only)       Balance Overall balance assessment: Needs assistance Sitting-balance support: No upper extremity supported;Feet supported Sitting balance-Leahy Scale: Good     Standing balance support: Bilateral upper extremity supported Standing balance-Leahy Scale: Poor Standing balance comment: reliant on external support                            Cognition Arousal/Alertness: Awake/alert Behavior During Therapy: WFL for tasks assessed/performed Overall Cognitive Status: Within Functional Limits for tasks assessed                                 General Comments: anxious at times      Exercises      General Comments General comments (skin integrity, edema, etc.): VSS on RA      Pertinent  Vitals/Pain Pain Assessment: Faces Faces Pain Scale: Hurts little more Pain Location: LLE Pain Descriptors / Indicators: Grimacing Pain Intervention(s): Monitored during session    Home Living                      Prior Function            PT Goals (current goals can now be found in the care plan section) Acute Rehab PT Goals Patient Stated Goal: go home Progress towards PT goals: Progressing toward goals    Frequency    Min 4X/week       PT Plan Current plan remains appropriate    Co-evaluation              AM-PAC PT "6 Clicks" Mobility   Outcome Measure  Help needed turning from your back to your side while in a flat bed without using bedrails?: A Little Help needed moving from lying on your back to sitting on the side of a flat bed without using bedrails?: A Little Help needed moving to and from a bed to a chair (including a wheelchair)?: A Little Help needed standing up from a chair using your arms (e.g., wheelchair or bedside chair)?: A Little Help needed to walk in hospital room?: A Little Help needed climbing 3-5 steps with a railing? : A Lot 6 Click Score: 17    End of Session Equipment Utilized During Treatment: Gait belt Activity Tolerance: Patient tolerated treatment well Patient left: in chair;with call bell/phone within reach;with family/visitor present Nurse Communication: Mobility status PT Visit Diagnosis: Other abnormalities of gait and mobility (R26.89);Unsteadiness on feet (R26.81)     Time: 3704-8889 PT Time Calculation (min) (ACUTE ONLY): 42 min  Charges:  $Gait Training: 23-37 mins $Therapeutic Activity: 8-22 mins                     Arlyss Gandy, PT, DPT Acute Rehabilitation Pager: [phone]    Arlyss Gandy 02/15/2021, 12:31 PM

## 2021-02-15 NOTE — Discharge Summary (Signed)
[facility] Discharge Summary   [name] MMN:[id number]  PCP: Irena Reichmann, DO  Date of admission: 02/11/2021   Date of discharge: 02/15/2021      Discharge Diagnoses:  Principal Problem:   Patellar fracture Active Problems:   Seizures (HCC)   Intractable chronic migraine without aura and without status migrainosus   Rheumatoid arthritis with positive rheumatoid factor (HCC)   Hypertension   Dyslipidemia  Admitted From: home Disposition:  Home with home health  Recommendations for Outpatient Follow-up:  PCP: Follow-up with PCP in 1 week follow-up with orthopedic as recommended.   Follow-up Information     Irena Reichmann, DO. Schedule an appointment as soon as possible for a visit in 2 week(s).   Specialty: Family Medicine Contact information: [address] [place] [address] [place] [address] [phone]         Yolonda Kida, MD. Schedule an appointment as soon as possible for a visit in 2 week(s).   Specialty: Orthopedic Surgery Contact information: [address] [place] [address] [place] [address] [phone]         Care, [facility] Follow up.   Specialty: Home Health Services Why: Home health services will be provided by [facility], start of care will be with 48 hours post discharge Contact information: [address] [place] [address] [phone]                Discharge Instructions     Diet - low sodium heart healthy   Complete by: As directed    Increase activity slowly   Complete by: As directed    Leave dressing on - Keep it clean, dry, and intact until clinic visit   Complete by: As directed        Diet recommendation: Cardiac diet  Activity: The patient is advised to gradually reintroduce usual activities, as tolerated  Discharge Condition: stable  Code Status: DNR   History of present illness: As per the H and P dictated on admission, "Brianna [name] is a 70  y.o. female with medical history significant of epilepsy; HLD; HTN; RA; PVD; and CVA presenting with a mechanical fall.   She reports that she slipped on water in the floor from her dog - she doesn't see out of her left eye and didn't see it.  She landed directly on her knee and could not move it immediately.  Pain is reasonably controlled at this time."  Hospital Course:  Summary of her active problems in the hospital is as following. 1.  Left Patella fracture Orthopedic consult. Underwent surgery. Pain control per orthopedic. Patient has multiple allergies with narcotics.  Will monitor.  Currently tolerating fentanyl. Tolerating tramadol. Also Toradol. Touchdown weightbearing for first 2 weeks then progress to 50% weightbearing for the next 4 weeks.  Maintain the knee in full extension for at least 2 weeks. Lovenox inpatient for DVT prophylaxis, aspirin 325 mg outpatient. Patient requesting CIR although not able candidate for CAR and therefore will go home with home health.   2.  History of seizures Continue Dilantin. Dilantin level adequate.   3.  Rheumatoid arthritis On Humira. Currently on hold.   4.  Glaucoma Patient takes tacrolimus for eye. Will continue. Continue rest of the eyedrops as well.   5.  Hyperlipidemia On Crestor. Will continue.  Also continue Zetia.   6.  Migraine. Resuming home medication.   7.  Postop blood loss anemia. Baseline hemoglobin around 13.  Currently hemoglobin  11.5.  Likely postop acute blood loss anemia.  Monitor for now.   8. Disposition Home with home health tomorrow after repeat PT evaluation.  9. Pain control  - [place] Controlled Substance Reporting System database was reviewed. - 5 day supply was provided. - Patient was instructed, not to drive, operate heavy machinery, perform activities at heights, swimming or participation in water activities or provide baby sitting services while on Pain, Sleep and Anxiety Medications;  until her outpatient Physician has advised to do so again.  - Also recommended to not to take more than prescribed Pain, Sleep and Anxiety Medications.  Patient was seen by physical therapy, who recommended Home Health. On the day of the discharge the patient's vitals were stable, and no other new acute medical condition were reported. The patient was felt safe to be discharge at Home with Home health.  Consultants: orthopedics Procedures: OPEN REDUCTION INTERNAL (ORIF) FIXATION PATELLA  DISCHARGE MEDICATION: Allergies as of 02/15/2021       Reactions   Morphine Other (See Comments)   Difficulty breathing   Morphine And Related Shortness Of Breath   Alphagan [brimonidine]    Eyelid swelling, scratching Allergic to preservative contained in this med   Codeine Nausea And Vomiting   Dorzolamide Hcl-timolol Mal    Inflammation of the eyelid, scratching Allergic to preservative contained in this med   Hydrocodone-acetaminophen Nausea And Vomiting   But tolerates tylenol   Sulfa Antibiotics Rash   "Large bumps"   Sulfamethoxazole Rash        Medication List     TAKE these medications    Aimovig 140 MG/ML Soaj Generic drug: Erenumab-aooe Inject 140 mg into the skin every 30 (thirty) days.   amLODipine 10 MG tablet Commonly known as: NORVASC Take 10 mg by mouth daily.   ascorbic Acid 500 MG Cpcr Commonly known as: VITAMIN C Take 500 mg by mouth daily. What changed: Another medication with the same name was removed. Continue taking this medication, and follow the directions you see here.   aspirin 325 MG tablet Take 325 mg by mouth daily.   atorvastatin 40 MG tablet Commonly known as: LIPITOR Take 40 mg by mouth daily.   botulinum toxin Type A 100 units Solr injection Commonly known as: BOTOX Inject IM into the head and neck muscles every 3 months by provider in office   butalbital-acetaminophen-caffeine 50-325-40 MG tablet Commonly known as: FIORICET Take 1  tablet by mouth every 6 (six) hours as needed for headache.   CALCIUM 1200 PO Take 2 capsules by mouth daily.   chlorthalidone 25 MG tablet Commonly known as: HYGROTON Take 1 tablet (25 mg total) by mouth daily.   Dilantin 100 MG ER capsule Generic drug: phenytoin TAKE 3 CAPSULES BY MOUTH DAILY   docusate sodium 100 MG capsule Commonly known as: COLACE Take 1 capsule (100 mg total) by mouth 2 (two) times daily.   Durezol 0.05 % Emul Generic drug: Difluprednate 5 drops in left eye and 2 in right eye daily.   ezetimibe 10 MG tablet Commonly known as: ZETIA Take 1 tablet (10 mg total) by mouth daily.   famotidine 20 MG tablet Commonly known as: PEPCID TAKE 1 TABLET(20 MG) BY MOUTH AT BEDTIME   feeding supplement Liqd Take 237 mLs by mouth 2 (two) times daily between meals.   Humira Pen 40 MG/0.4ML Pnkt Generic drug: Adalimumab Inject 40 mg into the skin every 14 (fourteen) days. As directed   hypromellose 0.3 % Gel  ophthalmic ointment Commonly known as: GENTEAL as needed.   leflunomide 20 MG tablet Commonly known as: ARAVA Take 20 mg by mouth daily.   LORazepam 0.5 MG tablet Commonly known as: ATIVAN Take 0.5 mg by mouth daily as needed for anxiety or sleep.   losartan 100 MG tablet Commonly known as: COZAAR Take 1 tablet (100 mg total) by mouth daily.   methocarbamol 500 MG tablet Commonly known as: Robaxin Take 1 tablet (500 mg total) by mouth every 8 (eight) hours as needed for muscle spasms.   Nurtec 75 MG Tbdp Generic drug: Rimegepant Sulfate Take 75 mg by mouth daily as needed. For migraines. Take as close to onset of migraine as possible. One daily maximum.   pantoprazole 40 MG tablet Commonly known as: PROTONIX TAKE 1 TABLET(40 MG) BY MOUTH DAILY   polyethylene glycol 17 g packet Commonly known as: MIRALAX / GLYCOLAX Take 17 g by mouth daily. Start taking on: February 16, 2021   promethazine 25 MG tablet Commonly known as: PHENERGAN Take 1  tablet (25 mg total) by mouth every 6 (six) hours as needed for nausea or vomiting.   Repatha SureClick 140 MG/ML Soaj Generic drug: Evolocumab Inject 1 pen into the skin every 14 (fourteen) days.   Restasis 0.05 % ophthalmic emulsion Generic drug: cycloSPORINE 2 (two) times daily.   rosuvastatin 20 MG tablet Commonly known as: CRESTOR Take 1 tablet (20 mg total) by mouth daily.   spironolactone 25 MG tablet Commonly known as: ALDACTONE Take 0.5 tablets (12.5 mg total) by mouth daily.   SUMAtriptan 6 MG/0.5ML Soln injection Commonly known as: IMITREX Inject 0.5 mLs (6 mg total) into the skin every 2 (two) hours as needed for migraine or headache. Take one dose at headache onset, can take additional dose 2hrs later if needed. No more then 2 injections in 24hrs   tacrolimus 1 MG capsule Commonly known as: PROGRAF Take 1 mg by mouth 2 (two) times daily.   traMADol 50 MG tablet Commonly known as: Ultram Take 1 tablet (50 mg total) by mouth every 6 (six) hours as needed for up to 24 doses for moderate pain.   VITAMIN D PO Take 1,000 Units by mouth daily.               Durable Medical Equipment  (From admission, onward)           Start     Ordered   02/15/21 1234  For home use only DME 3 n 1  Once        02/15/21 1233   02/15/21 1233  For home use only DME Walker rolling  Once       Question Answer Comment  Walker: With 5 Inch Wheels   Patient needs a walker to treat with the following condition Weakness      02/15/21 1233              Discharge Care Instructions  (From admission, onward)           Start     Ordered   02/15/21 0000  Leave dressing on - Keep it clean, dry, and intact until clinic visit        02/15/21 1130           Discharge Exam: Filed Weights   02/11/21 0946  Weight: 53.1 kg   Vitals:   02/14/21 1940 02/15/21 0826  BP: 115/70 (!) 143/76  Pulse: 87 81  Resp: 16 15  Temp: 98  F (36.7 C) 98.4 F (36.9 C)  SpO2:  95% 97%   General: Appear in no distress, no Rash; Oral Mucosa Clear, moist. no Abnormal Neck Mass Or lumps, Conjunctiva normal  Cardiovascular: S1 and S2 Present, no Murmur Respiratory: good respiratory effort, Bilateral Air entry present and CTA, no Crackles, no wheezes Abdomen: Bowel Sound present, Soft and no tenderness Extremities: left Pedal edema Neurology: alert and oriented to time, place, and person affect appropriate. no new focal deficit  The results of significant diagnostics from this hospitalization (including imaging, microbiology, ancillary and laboratory) are listed below for reference.    Significant Diagnostic Studies: DG Knee 1-2 Views Left  Result Date: 02/12/2021 CLINICAL DATA:  ORIF. EXAM: LEFT KNEE - 1-2 VIEW; DG C-ARM 1-60 MIN COMPARISON:  02/11/2021. FINDINGS: ORIF left patella. Hardware intact. Anatomic alignment. Two images obtained. 0 minutes 24 seconds fluoroscopy time. 0.4 mGy radiation dose. IMPRESSION: ORIF left patella with anatomic alignment. Electronically Signed   By: Maisie Fus  Register   On: 02/12/2021 08:59   DG Knee Complete 4 Views Left  Result Date: 02/11/2021 CLINICAL DATA:  Fall directly onto left knee this morning. Diffuse knee pain. EXAM: LEFT KNEE - COMPLETE 4+ VIEW COMPARISON:  None. FINDINGS: Transverse fracture of the patella with 5 cm superior displacement of the superior fragment. Prepatellar soft tissue swelling is present. IMPRESSION: Transverse displaced fracture of the patella with approximately 5 cm superior displacement of the superior fragment. Electronically Signed   By: Acquanetta Belling M.D.   On: 02/11/2021 11:44   DG C-Arm 1-60 Min  Result Date: 02/12/2021 CLINICAL DATA:  ORIF. EXAM: LEFT KNEE - 1-2 VIEW; DG C-ARM 1-60 MIN COMPARISON:  02/11/2021. FINDINGS: ORIF left patella. Hardware intact. Anatomic alignment. Two images obtained. 0 minutes 24 seconds fluoroscopy time. 0.4 mGy radiation dose. IMPRESSION: ORIF left patella with  anatomic alignment. Electronically Signed   By: Maisie Fus  Register   On: 02/12/2021 08:59    Microbiology: Recent Results (from the past 240 hour(s))  Resp Panel by RT-PCR (Flu A&B, Covid) Nasopharyngeal Swab     Status: None   Collection Time: 02/11/21 10:44 AM   Specimen: Nasopharyngeal Swab; Nasopharyngeal(NP) swabs in vial transport medium  Result Value Ref Range Status   SARS Coronavirus 2 by RT PCR NEGATIVE NEGATIVE Final    Comment: (NOTE) SARS-CoV-2 target nucleic acids are NOT DETECTED.  The SARS-CoV-2 RNA is generally detectable in upper respiratory specimens during the acute phase of infection. The lowest concentration of SARS-CoV-2 viral copies this assay can detect is 138 copies/mL. A negative result does not preclude SARS-Cov-2 infection and should not be used as the sole basis for treatment or other patient management decisions. A negative result may occur with  improper specimen collection/handling, submission of specimen other than nasopharyngeal swab, presence of viral mutation(s) within the areas targeted by this assay, and inadequate number of viral copies(<138 copies/mL). A negative result must be combined with clinical observations, patient history, and epidemiological information. The expected result is Negative.  Fact Sheet for Patients:  BloggerCourse.com  Fact Sheet for Healthcare Providers:  SeriousBroker.it  This test is no t yet approved or cleared by the [place] FDA and  has been authorized for detection and/or diagnosis of SARS-CoV-2 by FDA under an Emergency Use Authorization (EUA). This EUA will remain  in effect (meaning this test can be used) for the duration of the COVID-19 declaration under Section 564(b)(1) of the Act, 21 U.S.C.section 360bbb-3(b)(1), unless the authorization is terminated  or revoked  sooner.       Influenza A by PCR NEGATIVE NEGATIVE Final   Influenza B by PCR  NEGATIVE NEGATIVE Final    Comment: (NOTE) The Xpert Xpress SARS-CoV-2/FLU/RSV plus assay is intended as an aid in the diagnosis of influenza from Nasopharyngeal swab specimens and should not be used as a sole basis for treatment. Nasal washings and aspirates are unacceptable for Xpert Xpress SARS-CoV-2/FLU/RSV testing.  Fact Sheet for Patients: BloggerCourse.com  Fact Sheet for Healthcare Providers: SeriousBroker.it  This test is not yet approved or cleared by the [place] FDA and has been authorized for detection and/or diagnosis of SARS-CoV-2 by FDA under an Emergency Use Authorization (EUA). This EUA will remain in effect (meaning this test can be used) for the duration of the COVID-19 declaration under Section 564(b)(1) of the Act, 21 U.S.C. section 360bbb-3(b)(1), unless the authorization is terminated or revoked.  Performed at [facility], [address], [place], [place] [address]    Labs: CBC: Recent Labs  Lab 02/11/21 1044 02/12/21 0443 02/13/21 0529 02/14/21 1524  WBC 8.0 9.5 11.8* 11.6*  NEUTROABS 5.4  --   --   --   HGB 13.3 12.4 11.5* 11.4*  HCT 41.6 39.0 35.0* 33.9*  MCV 90.8 90.9 88.2 89.0  PLT 238 215 183 191   Basic Metabolic Panel: Recent Labs  Lab 02/11/21 1044 02/12/21 0443 02/13/21 0529 02/15/21 0448  NA 138 135 135 135  K 4.4 3.8 3.6 3.1*  CL 101 97* 97* 96*  CO2 GLUCOSE 108* 124* 136* 108*  BUN CREATININE 0.73 0.62 0.70 0.52  CALCIUM 9.5 9.4 8.8* 8.4*   Liver Function Tests: No results for input(s): AST, ALT, ALKPHOS, BILITOT, PROT, ALBUMIN in the last 168 hours. CBG: No results for input(s): GLUCAP in the last 168 hours.  Time spent: 35 minutes  Signed:  Lynden Oxford  [facility] 02/15/2021

## 2021-02-15 NOTE — TOC Transition Note (Signed)
Transition of Care [facility]) - CM/SW Discharge Note   Patient Details  Name: Brianna [name] MRN: [id number] Date of Birth: [birth year]  [facility] CM/SW Contact:  Epifanio Lesches, RN Phone Number: 02/15/2021, 12:55 PM   Clinical Narrative:    Patient will DC to: home Anticipated DC date: 02/15/2021 Family notified: yes, Morrie Sheldon Transport by: car       -S/P ORIF , L knee/ patella 02/12/2021  Per MD patient ready for DC today. RN, patient, patient's family, and [facility] aware of DC .... Pt agreed to home health services. Pt was without preference. Referral made with [facility] and accepted, SOC within 48 hours . Referral made with [facility] for [id number] walker and 3in1/BSC. Equipment will be delivered to bedside prior to d/c.  Post hospital f/u noted on AVS.  Pt without Rx med concerns.  Family to provide transportation to home.  RNCM will sign off for now as intervention is no longer needed. Please consult [place] again if new needs arise.     Barriers to Discharge: Continued Medical Work up   Patient Goals and CMS Choice     Choice offered to / list presented to : Patient  Discharge Placement                       Discharge Plan and Services   Discharge Planning Services: CM Consult            DME Arranged: 3-N-1, Walker rolling   Date DME Agency Contacted: 02/13/21 Time DME Agency Contacted: [phone] Representative spoke with at DME Agency: Velna Hatchet HH Arranged: PT, OT [facility] Agency: [facility] Date [facility] Agency Contacted: 02/13/21 Time HH Agency Contacted: [phone] Representative spoke with at [facility] Agency: Kandee Keen  Social Determinants of Health (SDOH) Interventions     Readmission Risk Interventions No flowsheet data found.

## 2021-02-15 NOTE — Progress Notes (Signed)
Medial devices delivered to bedside. Patient left via wheelchair with family.

## 2021-02-17 DIAGNOSIS — G43019 Migraine without aura, intractable, without status migrainosus: Secondary | ICD-10-CM | POA: Diagnosis not present

## 2021-02-17 DIAGNOSIS — S82032D Displaced transverse fracture of left patella, subsequent encounter for closed fracture with routine healing: Secondary | ICD-10-CM | POA: Diagnosis not present

## 2021-02-17 DIAGNOSIS — G40909 Epilepsy, unspecified, not intractable, without status epilepticus: Secondary | ICD-10-CM | POA: Diagnosis not present

## 2021-02-17 DIAGNOSIS — H5462 Unqualified visual loss, left eye, normal vision right eye: Secondary | ICD-10-CM | POA: Diagnosis not present

## 2021-02-17 DIAGNOSIS — D500 Iron deficiency anemia secondary to blood loss (chronic): Secondary | ICD-10-CM | POA: Diagnosis not present

## 2021-02-17 DIAGNOSIS — M059 Rheumatoid arthritis with rheumatoid factor, unspecified: Secondary | ICD-10-CM | POA: Diagnosis not present

## 2021-02-17 DIAGNOSIS — M47812 Spondylosis without myelopathy or radiculopathy, cervical region: Secondary | ICD-10-CM | POA: Diagnosis not present

## 2021-02-17 DIAGNOSIS — I7300 Raynaud's syndrome without gangrene: Secondary | ICD-10-CM | POA: Diagnosis not present

## 2021-02-17 DIAGNOSIS — I10 Essential (primary) hypertension: Secondary | ICD-10-CM | POA: Diagnosis not present

## 2021-02-19 ENCOUNTER — Ambulatory Visit: Payer: Medicare PPO

## 2021-02-19 ENCOUNTER — Other Ambulatory Visit: Payer: Medicare PPO

## 2021-02-20 DIAGNOSIS — G40909 Epilepsy, unspecified, not intractable, without status epilepticus: Secondary | ICD-10-CM | POA: Diagnosis not present

## 2021-02-20 DIAGNOSIS — I7300 Raynaud's syndrome without gangrene: Secondary | ICD-10-CM | POA: Diagnosis not present

## 2021-02-20 DIAGNOSIS — D500 Iron deficiency anemia secondary to blood loss (chronic): Secondary | ICD-10-CM | POA: Diagnosis not present

## 2021-02-20 DIAGNOSIS — G43019 Migraine without aura, intractable, without status migrainosus: Secondary | ICD-10-CM | POA: Diagnosis not present

## 2021-02-20 DIAGNOSIS — M47812 Spondylosis without myelopathy or radiculopathy, cervical region: Secondary | ICD-10-CM | POA: Diagnosis not present

## 2021-02-20 DIAGNOSIS — M059 Rheumatoid arthritis with rheumatoid factor, unspecified: Secondary | ICD-10-CM | POA: Diagnosis not present

## 2021-02-20 DIAGNOSIS — I10 Essential (primary) hypertension: Secondary | ICD-10-CM | POA: Diagnosis not present

## 2021-02-20 DIAGNOSIS — S82032D Displaced transverse fracture of left patella, subsequent encounter for closed fracture with routine healing: Secondary | ICD-10-CM | POA: Diagnosis not present

## 2021-02-20 DIAGNOSIS — H5462 Unqualified visual loss, left eye, normal vision right eye: Secondary | ICD-10-CM | POA: Diagnosis not present

## 2021-02-23 DIAGNOSIS — I10 Essential (primary) hypertension: Secondary | ICD-10-CM | POA: Diagnosis not present

## 2021-02-23 DIAGNOSIS — S82032D Displaced transverse fracture of left patella, subsequent encounter for closed fracture with routine healing: Secondary | ICD-10-CM | POA: Diagnosis not present

## 2021-02-23 DIAGNOSIS — D500 Iron deficiency anemia secondary to blood loss (chronic): Secondary | ICD-10-CM | POA: Diagnosis not present

## 2021-02-23 DIAGNOSIS — M47812 Spondylosis without myelopathy or radiculopathy, cervical region: Secondary | ICD-10-CM | POA: Diagnosis not present

## 2021-02-23 DIAGNOSIS — M059 Rheumatoid arthritis with rheumatoid factor, unspecified: Secondary | ICD-10-CM | POA: Diagnosis not present

## 2021-02-23 DIAGNOSIS — H5462 Unqualified visual loss, left eye, normal vision right eye: Secondary | ICD-10-CM | POA: Diagnosis not present

## 2021-02-23 DIAGNOSIS — I7300 Raynaud's syndrome without gangrene: Secondary | ICD-10-CM | POA: Diagnosis not present

## 2021-02-23 DIAGNOSIS — G40909 Epilepsy, unspecified, not intractable, without status epilepticus: Secondary | ICD-10-CM | POA: Diagnosis not present

## 2021-02-23 DIAGNOSIS — G43019 Migraine without aura, intractable, without status migrainosus: Secondary | ICD-10-CM | POA: Diagnosis not present

## 2021-02-24 DIAGNOSIS — G40909 Epilepsy, unspecified, not intractable, without status epilepticus: Secondary | ICD-10-CM | POA: Diagnosis not present

## 2021-02-24 DIAGNOSIS — M059 Rheumatoid arthritis with rheumatoid factor, unspecified: Secondary | ICD-10-CM | POA: Diagnosis not present

## 2021-02-24 DIAGNOSIS — I7300 Raynaud's syndrome without gangrene: Secondary | ICD-10-CM | POA: Diagnosis not present

## 2021-02-24 DIAGNOSIS — H5462 Unqualified visual loss, left eye, normal vision right eye: Secondary | ICD-10-CM | POA: Diagnosis not present

## 2021-02-24 DIAGNOSIS — S82032D Displaced transverse fracture of left patella, subsequent encounter for closed fracture with routine healing: Secondary | ICD-10-CM | POA: Diagnosis not present

## 2021-02-24 DIAGNOSIS — M47812 Spondylosis without myelopathy or radiculopathy, cervical region: Secondary | ICD-10-CM | POA: Diagnosis not present

## 2021-02-24 DIAGNOSIS — I10 Essential (primary) hypertension: Secondary | ICD-10-CM | POA: Diagnosis not present

## 2021-02-24 DIAGNOSIS — D500 Iron deficiency anemia secondary to blood loss (chronic): Secondary | ICD-10-CM | POA: Diagnosis not present

## 2021-02-24 DIAGNOSIS — G43019 Migraine without aura, intractable, without status migrainosus: Secondary | ICD-10-CM | POA: Diagnosis not present

## 2021-02-27 ENCOUNTER — Other Ambulatory Visit: Payer: Medicare PPO

## 2021-02-27 ENCOUNTER — Ambulatory Visit: Payer: Medicare PPO

## 2021-02-27 NOTE — Telephone Encounter (Signed)
Called patient to follow up on [employer] patient assistance application. She recently broke her knee cap and had surgery. She is waiting on the bills from all of that and her bills from [facility]. Once she receives, she will bring all of her documents to the office.

## 2021-03-01 ENCOUNTER — Other Ambulatory Visit: Payer: Self-pay | Admitting: Neurology

## 2021-03-02 DIAGNOSIS — G43019 Migraine without aura, intractable, without status migrainosus: Secondary | ICD-10-CM | POA: Diagnosis not present

## 2021-03-02 DIAGNOSIS — I10 Essential (primary) hypertension: Secondary | ICD-10-CM | POA: Diagnosis not present

## 2021-03-02 DIAGNOSIS — M059 Rheumatoid arthritis with rheumatoid factor, unspecified: Secondary | ICD-10-CM | POA: Diagnosis not present

## 2021-03-02 DIAGNOSIS — M47812 Spondylosis without myelopathy or radiculopathy, cervical region: Secondary | ICD-10-CM | POA: Diagnosis not present

## 2021-03-02 DIAGNOSIS — H5462 Unqualified visual loss, left eye, normal vision right eye: Secondary | ICD-10-CM | POA: Diagnosis not present

## 2021-03-02 DIAGNOSIS — S82032D Displaced transverse fracture of left patella, subsequent encounter for closed fracture with routine healing: Secondary | ICD-10-CM | POA: Diagnosis not present

## 2021-03-02 DIAGNOSIS — D500 Iron deficiency anemia secondary to blood loss (chronic): Secondary | ICD-10-CM | POA: Diagnosis not present

## 2021-03-02 DIAGNOSIS — G40909 Epilepsy, unspecified, not intractable, without status epilepticus: Secondary | ICD-10-CM | POA: Diagnosis not present

## 2021-03-02 DIAGNOSIS — I7300 Raynaud's syndrome without gangrene: Secondary | ICD-10-CM | POA: Diagnosis not present

## 2021-03-03 DIAGNOSIS — H401112 Primary open-angle glaucoma, right eye, moderate stage: Secondary | ICD-10-CM | POA: Diagnosis not present

## 2021-03-03 DIAGNOSIS — H353221 Exudative age-related macular degeneration, left eye, with active choroidal neovascularization: Secondary | ICD-10-CM | POA: Diagnosis not present

## 2021-03-03 DIAGNOSIS — Z947 Corneal transplant status: Secondary | ICD-10-CM | POA: Diagnosis not present

## 2021-03-03 DIAGNOSIS — H30102 Unspecified disseminated chorioretinal inflammation, left eye: Secondary | ICD-10-CM | POA: Diagnosis not present

## 2021-03-03 DIAGNOSIS — Z79899 Other long term (current) drug therapy: Secondary | ICD-10-CM | POA: Diagnosis not present

## 2021-03-03 DIAGNOSIS — H3581 Retinal edema: Secondary | ICD-10-CM | POA: Diagnosis not present

## 2021-03-03 DIAGNOSIS — H30033 Focal chorioretinal inflammation, peripheral, bilateral: Secondary | ICD-10-CM | POA: Diagnosis not present

## 2021-03-03 DIAGNOSIS — H35373 Puckering of macula, bilateral: Secondary | ICD-10-CM | POA: Diagnosis not present

## 2021-03-03 DIAGNOSIS — H401123 Primary open-angle glaucoma, left eye, severe stage: Secondary | ICD-10-CM | POA: Diagnosis not present

## 2021-03-04 DIAGNOSIS — Z4789 Encounter for other orthopedic aftercare: Secondary | ICD-10-CM | POA: Diagnosis not present

## 2021-03-05 DIAGNOSIS — H5462 Unqualified visual loss, left eye, normal vision right eye: Secondary | ICD-10-CM | POA: Diagnosis not present

## 2021-03-05 DIAGNOSIS — S82032D Displaced transverse fracture of left patella, subsequent encounter for closed fracture with routine healing: Secondary | ICD-10-CM | POA: Diagnosis not present

## 2021-03-05 DIAGNOSIS — G43019 Migraine without aura, intractable, without status migrainosus: Secondary | ICD-10-CM | POA: Diagnosis not present

## 2021-03-05 DIAGNOSIS — D500 Iron deficiency anemia secondary to blood loss (chronic): Secondary | ICD-10-CM | POA: Diagnosis not present

## 2021-03-05 DIAGNOSIS — I7300 Raynaud's syndrome without gangrene: Secondary | ICD-10-CM | POA: Diagnosis not present

## 2021-03-05 DIAGNOSIS — G40909 Epilepsy, unspecified, not intractable, without status epilepticus: Secondary | ICD-10-CM | POA: Diagnosis not present

## 2021-03-05 DIAGNOSIS — I10 Essential (primary) hypertension: Secondary | ICD-10-CM | POA: Diagnosis not present

## 2021-03-05 DIAGNOSIS — M059 Rheumatoid arthritis with rheumatoid factor, unspecified: Secondary | ICD-10-CM | POA: Diagnosis not present

## 2021-03-05 DIAGNOSIS — M47812 Spondylosis without myelopathy or radiculopathy, cervical region: Secondary | ICD-10-CM | POA: Diagnosis not present

## 2021-03-10 ENCOUNTER — Ambulatory Visit: Payer: Medicare PPO | Admitting: Neurology

## 2021-03-10 ENCOUNTER — Other Ambulatory Visit: Payer: Self-pay

## 2021-03-10 DIAGNOSIS — G43719 Chronic migraine without aura, intractable, without status migrainosus: Secondary | ICD-10-CM | POA: Diagnosis not present

## 2021-03-10 DIAGNOSIS — G40909 Epilepsy, unspecified, not intractable, without status epilepticus: Secondary | ICD-10-CM | POA: Diagnosis not present

## 2021-03-10 DIAGNOSIS — M47812 Spondylosis without myelopathy or radiculopathy, cervical region: Secondary | ICD-10-CM | POA: Diagnosis not present

## 2021-03-10 DIAGNOSIS — S82032D Displaced transverse fracture of left patella, subsequent encounter for closed fracture with routine healing: Secondary | ICD-10-CM | POA: Diagnosis not present

## 2021-03-10 DIAGNOSIS — G43019 Migraine without aura, intractable, without status migrainosus: Secondary | ICD-10-CM | POA: Diagnosis not present

## 2021-03-10 DIAGNOSIS — M059 Rheumatoid arthritis with rheumatoid factor, unspecified: Secondary | ICD-10-CM | POA: Diagnosis not present

## 2021-03-10 DIAGNOSIS — I7300 Raynaud's syndrome without gangrene: Secondary | ICD-10-CM | POA: Diagnosis not present

## 2021-03-10 DIAGNOSIS — D500 Iron deficiency anemia secondary to blood loss (chronic): Secondary | ICD-10-CM | POA: Diagnosis not present

## 2021-03-10 DIAGNOSIS — H5462 Unqualified visual loss, left eye, normal vision right eye: Secondary | ICD-10-CM | POA: Diagnosis not present

## 2021-03-10 DIAGNOSIS — I10 Essential (primary) hypertension: Secondary | ICD-10-CM | POA: Diagnosis not present

## 2021-03-10 NOTE — Progress Notes (Signed)
Botox- 200 units x 1 vial Lot: [id number] Expiration: 07/2023 NDC: 2637-8588-50  Bacteriostatic 0.9% Sodium Chloride- 3mL total Lot: [id number] Expiration: 07/19/2022 NDC: 8786-7672-09  Dx: O70.962 B/B

## 2021-03-10 NOTE — Progress Notes (Signed)
Interval history 03/10/2021: stable. No corrugators or procerus. Very high frontalis. Rest in traps. Broke her patella in a wheelchair now.  Interval history 12/01/2020: stable  Send to Dr. Vickey Huger, intractable headches, morning headaches: Intractable headaches, wakes with headaches, has headaches when she wakes in the morning, hx of strokes: sleep evaluation  Imitrex injections: Patient has intractable chronic migraines, the best thing that is ever worked for her is Imitrex injections, we have tried multiple alternative medications including analgesics, Nurtec, Ubrelvy, Reyvow, and multiple other for second and third line acute management medications.  Patient had stroke, at that time we took her off her Imitrex, today she implore only request to have it back, she understands the risks, we did review the risks and discussed that sometimes life is about weighing the benefits versus those risks, at this time I will prescribe them for her, she is fully aware of the risk that she is taking with a history of vascular disease.  Interval history 02/12/2020: Migraines fluctuate with her eye problems, she sees cornea specialists and retina specialists and they may install long term steroid release long term in the eyes. Las botox she had only 4 migraine days a month taken care of by imitrex, significant.  >70% improvement in frequency of migraines, Baseline 15 severemigraine days a month and daily headaches. No masseters.  She absolutely loved  .Dry needling: cervical myofascial pain, Send back to dry needling - [address] for cervical myofascial pain syndrome. Gave samples for Nurtec for her to try as well acutely at last visit but she did not like it.  Tried: VPA, Topamax, antidepressants, norvasc, phenergan, frova, aimovig, ajovy, fioricet, (save 6 As), verapamil     Consent Form Botulism Toxin Injection For Chronic Migraine    Reviewed orally with patient, additionally signature is on  file:  Botulism toxin has been approved by the Federal drug administration for treatment of chronic migraine. Botulism toxin does not cure chronic migraine and it may not be effective in some patients.  The administration of botulism toxin is accomplished by injecting a small amount of toxin into the muscles of the neck and head. Dosage must be titrated for each individual. Any benefits resulting from botulism toxin tend to wear off after 3 months with a repeat injection required if benefit is to be maintained. Injections are usually done every 3-4 months with maximum effect peak achieved by about 2 or 3 weeks. Botulism toxin is expensive and you should be sure of what costs you will incur resulting from the injection.  The side effects of botulism toxin use for chronic migraine may include:   -Transient, and usually mild, facial weakness with facial injections  -Transient, and usually mild, head or neck weakness with head/neck injections  -Reduction or loss of forehead facial animation due to forehead muscle weakness  -Eyelid drooping  -Dry eye  -Pain at the site of injection or bruising at the site of injection  -Double vision  -Potential unknown long term risks  Contraindications: You should not have Botox if you are pregnant, nursing, allergic to albumin, have an infection, skin condition, or muscle weakness at the site of the injection, or have myasthenia gravis, Lambert-Eaton syndrome, or ALS.  It is also possible that as with any injection, there may be an allergic reaction or no effect from the medication. Reduced effectiveness after repeated injections is sometimes seen and rarely infection at the injection site may occur. All care will be taken to prevent these side effects. If  therapy is given over a long time, atrophy and wasting in the muscle injected may occur. Occasionally the patient's become refractory to treatment because they develop antibodies to the toxin. In this event,  therapy needs to be modified.  I have read the above information and consent to the administration of botulism toxin.    BOTOX PROCEDURE NOTE FOR MIGRAINE HEADACHE    Contraindications and precautions discussed with patient(above). Aseptic procedure was observed and patient tolerated procedure. Procedure performed by Dr. Artemio Aly  The condition has existed for more than 6 months, and pt does not have a diagnosis of ALS, Myasthenia Gravis or Lambert-Eaton Syndrome.  Risks and benefits of injections discussed and pt agrees to proceed with the procedure.  Written consent obtained  These injections are medically necessary. Pt  receives good benefits from these injections. These injections do not cause sedations or hallucinations which the oral therapies may cause.  Description of procedure:  The patient was placed in a sitting position. The standard protocol was used for Botox as follows, with 5 units of Botox injected at each site:   -Procerus muscle, midline injection DID NOT PERFORM  -Corrugator muscle, bilateral injection DID NOT PERFORM  -Frontalis muscle, bilateral injection, with 2 sites each side, medial injection was performed in the upper one third of the frontalis muscle, in the region vertical from the medial inferior edge of the superior orbital rim. The lateral injection was again in the upper one third of the forehead vertically above the lateral limbus of the cornea, 1.5 cm lateral to the medial injection site.  -Temporalis muscle injection, 4 sites, bilaterally. The first injection was 3 cm above the tragus of the ear, second injection site was 1.5 cm to 3 cm up from the first injection site in line with the tragus of the ear. The third injection site was 1.5-3 cm forward between the first 2 injection sites. The fourth injection site was 1.5 cm posterior to the second injection site.   -Occipitalis muscle injection, 3 sites, bilaterally. The first injection was done one  half way between the occipital protuberance and the tip of the mastoid process behind the ear. The second injection site was done lateral and superior to the first, 1 fingerbreadth from the first injection. The third injection site was 1 fingerbreadth superiorly and medially from the first injection site.  -Cervical paraspinal muscle injection, 2 sites, bilateral knee first injection site was 1 cm from the midline of the cervical spine, 3 cm inferior to the lower border of the occipital protuberance. The second injection site was 1.5 cm superiorly and laterally to the first injection site.  -Trapezius muscle injection was performed at 3 sites, bilaterally. The first injection site was in the upper trapezius muscle halfway between the inflection point of the neck, and the acromion. The second injection site was one half way between the acromion and the first injection site. The third injection was done between the first injection site and the inflection point of the neck.   Will return for repeat injection in 3 months.   200 units of Botox was used, any Botox not injected was wasted. The patient tolerated the procedure well, there were no complications of the above procedure.

## 2021-03-11 DIAGNOSIS — H5462 Unqualified visual loss, left eye, normal vision right eye: Secondary | ICD-10-CM | POA: Diagnosis not present

## 2021-03-11 DIAGNOSIS — G40909 Epilepsy, unspecified, not intractable, without status epilepticus: Secondary | ICD-10-CM | POA: Diagnosis not present

## 2021-03-11 DIAGNOSIS — I10 Essential (primary) hypertension: Secondary | ICD-10-CM | POA: Diagnosis not present

## 2021-03-11 DIAGNOSIS — S82032D Displaced transverse fracture of left patella, subsequent encounter for closed fracture with routine healing: Secondary | ICD-10-CM | POA: Diagnosis not present

## 2021-03-11 DIAGNOSIS — D500 Iron deficiency anemia secondary to blood loss (chronic): Secondary | ICD-10-CM | POA: Diagnosis not present

## 2021-03-11 DIAGNOSIS — M059 Rheumatoid arthritis with rheumatoid factor, unspecified: Secondary | ICD-10-CM | POA: Diagnosis not present

## 2021-03-11 DIAGNOSIS — H401112 Primary open-angle glaucoma, right eye, moderate stage: Secondary | ICD-10-CM | POA: Diagnosis not present

## 2021-03-11 DIAGNOSIS — G43019 Migraine without aura, intractable, without status migrainosus: Secondary | ICD-10-CM | POA: Diagnosis not present

## 2021-03-11 DIAGNOSIS — I7300 Raynaud's syndrome without gangrene: Secondary | ICD-10-CM | POA: Diagnosis not present

## 2021-03-11 DIAGNOSIS — H401123 Primary open-angle glaucoma, left eye, severe stage: Secondary | ICD-10-CM | POA: Diagnosis not present

## 2021-03-11 DIAGNOSIS — M47812 Spondylosis without myelopathy or radiculopathy, cervical region: Secondary | ICD-10-CM | POA: Diagnosis not present

## 2021-03-12 DIAGNOSIS — I7300 Raynaud's syndrome without gangrene: Secondary | ICD-10-CM | POA: Diagnosis not present

## 2021-03-12 DIAGNOSIS — D500 Iron deficiency anemia secondary to blood loss (chronic): Secondary | ICD-10-CM | POA: Diagnosis not present

## 2021-03-12 DIAGNOSIS — M47812 Spondylosis without myelopathy or radiculopathy, cervical region: Secondary | ICD-10-CM | POA: Diagnosis not present

## 2021-03-12 DIAGNOSIS — H5462 Unqualified visual loss, left eye, normal vision right eye: Secondary | ICD-10-CM | POA: Diagnosis not present

## 2021-03-12 DIAGNOSIS — S82032D Displaced transverse fracture of left patella, subsequent encounter for closed fracture with routine healing: Secondary | ICD-10-CM | POA: Diagnosis not present

## 2021-03-12 DIAGNOSIS — I10 Essential (primary) hypertension: Secondary | ICD-10-CM | POA: Diagnosis not present

## 2021-03-12 DIAGNOSIS — M059 Rheumatoid arthritis with rheumatoid factor, unspecified: Secondary | ICD-10-CM | POA: Diagnosis not present

## 2021-03-12 DIAGNOSIS — G40909 Epilepsy, unspecified, not intractable, without status epilepticus: Secondary | ICD-10-CM | POA: Diagnosis not present

## 2021-03-12 DIAGNOSIS — G43019 Migraine without aura, intractable, without status migrainosus: Secondary | ICD-10-CM | POA: Diagnosis not present

## 2021-03-17 DIAGNOSIS — M47812 Spondylosis without myelopathy or radiculopathy, cervical region: Secondary | ICD-10-CM | POA: Diagnosis not present

## 2021-03-17 DIAGNOSIS — I7300 Raynaud's syndrome without gangrene: Secondary | ICD-10-CM | POA: Diagnosis not present

## 2021-03-17 DIAGNOSIS — I10 Essential (primary) hypertension: Secondary | ICD-10-CM | POA: Diagnosis not present

## 2021-03-17 DIAGNOSIS — H5462 Unqualified visual loss, left eye, normal vision right eye: Secondary | ICD-10-CM | POA: Diagnosis not present

## 2021-03-17 DIAGNOSIS — G43019 Migraine without aura, intractable, without status migrainosus: Secondary | ICD-10-CM | POA: Diagnosis not present

## 2021-03-17 DIAGNOSIS — G40909 Epilepsy, unspecified, not intractable, without status epilepticus: Secondary | ICD-10-CM | POA: Diagnosis not present

## 2021-03-17 DIAGNOSIS — D500 Iron deficiency anemia secondary to blood loss (chronic): Secondary | ICD-10-CM | POA: Diagnosis not present

## 2021-03-17 DIAGNOSIS — S82032D Displaced transverse fracture of left patella, subsequent encounter for closed fracture with routine healing: Secondary | ICD-10-CM | POA: Diagnosis not present

## 2021-03-17 DIAGNOSIS — M059 Rheumatoid arthritis with rheumatoid factor, unspecified: Secondary | ICD-10-CM | POA: Diagnosis not present

## 2021-03-18 ENCOUNTER — Other Ambulatory Visit: Payer: Self-pay

## 2021-03-18 ENCOUNTER — Other Ambulatory Visit: Payer: Medicare PPO | Admitting: *Deleted

## 2021-03-18 ENCOUNTER — Ambulatory Visit (INDEPENDENT_AMBULATORY_CARE_PROVIDER_SITE_OTHER): Payer: Medicare PPO | Admitting: Pharmacist

## 2021-03-18 VITALS — BP 128/78 | HR 85

## 2021-03-18 DIAGNOSIS — E785 Hyperlipidemia, unspecified: Secondary | ICD-10-CM

## 2021-03-18 DIAGNOSIS — G43019 Migraine without aura, intractable, without status migrainosus: Secondary | ICD-10-CM | POA: Diagnosis not present

## 2021-03-18 DIAGNOSIS — M059 Rheumatoid arthritis with rheumatoid factor, unspecified: Secondary | ICD-10-CM | POA: Diagnosis not present

## 2021-03-18 DIAGNOSIS — I7300 Raynaud's syndrome without gangrene: Secondary | ICD-10-CM | POA: Diagnosis not present

## 2021-03-18 DIAGNOSIS — E7800 Pure hypercholesterolemia, unspecified: Secondary | ICD-10-CM | POA: Diagnosis not present

## 2021-03-18 DIAGNOSIS — S82032D Displaced transverse fracture of left patella, subsequent encounter for closed fracture with routine healing: Secondary | ICD-10-CM | POA: Diagnosis not present

## 2021-03-18 DIAGNOSIS — I10 Essential (primary) hypertension: Secondary | ICD-10-CM

## 2021-03-18 DIAGNOSIS — H5462 Unqualified visual loss, left eye, normal vision right eye: Secondary | ICD-10-CM | POA: Diagnosis not present

## 2021-03-18 DIAGNOSIS — G40909 Epilepsy, unspecified, not intractable, without status epilepticus: Secondary | ICD-10-CM | POA: Diagnosis not present

## 2021-03-18 DIAGNOSIS — M47812 Spondylosis without myelopathy or radiculopathy, cervical region: Secondary | ICD-10-CM | POA: Diagnosis not present

## 2021-03-18 DIAGNOSIS — D500 Iron deficiency anemia secondary to blood loss (chronic): Secondary | ICD-10-CM | POA: Diagnosis not present

## 2021-03-18 LAB — BASIC METABOLIC PANEL
BUN/Creatinine Ratio: 18 (ref 12–28)
BUN: 11 mg/dL (ref 8–27)
CO2: 29 mmol/L (ref 20–29)
Calcium: 8.9 mg/dL (ref 8.7–10.3)
Chloride: 98 mmol/L (ref 96–106)
Creatinine, Ser: 0.6 mg/dL (ref 0.57–1.00)
Glucose: 96 mg/dL (ref 65–99)
Potassium: 3.8 mmol/L (ref 3.5–5.2)
Sodium: 140 mmol/L (ref 134–144)
eGFR: 98 mL/min/{1.73_m2} (ref >59)

## 2021-03-18 LAB — HEPATIC FUNCTION PANEL
ALT: 16 IU/L (ref 0–32)
AST: 24 IU/L (ref 0–40)
Albumin: 4.3 g/dL (ref 3.8–4.8)
Alkaline Phosphatase: 104 IU/L (ref 44–121)
Bilirubin Total: 0.2 mg/dL (ref 0.0–1.2)
Bilirubin, Direct: 0.11 mg/dL (ref 0.00–0.40)
Total Protein: 7 g/dL (ref 6.0–8.5)

## 2021-03-18 LAB — LIPID PANEL
Chol/HDL Ratio: 2 ratio (ref 0.0–4.4)
Cholesterol, Total: 140 mg/dL (ref 100–199)
HDL: 71 mg/dL (ref >39)
LDL Chol Calc (NIH): 49 mg/dL (ref 0–99)
Triglycerides: 113 mg/dL (ref 0–149)
VLDL Cholesterol Cal: 20 mg/dL (ref 5–40)

## 2021-03-18 MED ORDER — ROSUVASTATIN CALCIUM 20 MG PO TABS: 20.0000 mg | ORAL_TABLET | Freq: Every day | ORAL | 3 refills | Status: DC

## 2021-03-18 MED ORDER — AMLODIPINE BESYLATE 10 MG PO TABS: 10.0000 mg | ORAL_TABLET | Freq: Every day | ORAL | 3 refills | Status: DC

## 2021-03-18 MED ORDER — CHLORTHALIDONE 25 MG PO TABS: 25.0000 mg | ORAL_TABLET | Freq: Every day | ORAL | 3 refills | Status: DC

## 2021-03-18 MED ORDER — LOSARTAN POTASSIUM 100 MG PO TABS: 100.0000 mg | ORAL_TABLET | Freq: Every day | ORAL | 3 refills | Status: DC

## 2021-03-18 MED ORDER — SPIRONOLACTONE 25 MG PO TABS: 12.5000 mg | ORAL_TABLET | Freq: Every day | ORAL | 3 refills | Status: DC

## 2021-03-18 NOTE — Progress Notes (Signed)
Patient ID: [name]                 DOB: 11/24/1950                      MRN: [id number]     HPI: Brianna [name] is a 70 y.o. female referred by Dr. Shari Prows to HTN clinic. PMH is significant for HTN, HLD, PVD, RA, migraines, arthritis, epilepsy and stroke. Pulmonary artery trunk noted to be enlarged on CT scan, indicative of possible underlying PH. Coronary artery calcifications also noted on CT scan. At last visit in HTN clinic in mid June, BP had improved to 132/78 and no med changes were made. She was admitted to [facility] 02/11/21 with patellar fracture after a fall (slipped on water on the floor) and underwent ORIF of her patella.  Patient reports medication adherence and is tolerating therapy well. Denies dizziness and LE swelling. PT/OT is coming out 2-3x per week as she recovers from knee surgery. Here in a wheelchair today. Home BP has been 130-135. Hasn't taken AM BP meds yet today, usually takes 8-9am. Has been using her Robaxin to help with her knee, not using much tramadol or NSAIDs. K was 3.1 in the hospital last month, typically normal. This is being rechecked today. She does report some difficulty with a few of her Repatha injections. Technique reviewed today.  Current HTN meds:  amlodipine 10mg  daily (AM) chlorthalidone 25mg  daily (AM) losartan 100mg  daily (AM) spironolactone 12.5mg  daily (AM)  Previously tried: clonidine 0.1 mg BID (severe dry mouth, extreme fatigue, dizziness, and loss of appetite)  BP goal: <130/80 mmHg   Family History: Father died from MI. Epilepsy in her sister; Heart disease in her father and mother; High Cholesterol in her brother, brother, brother, and sister; High blood pressure in her brother, brother, and brother; Lung disease in her mother; Prostate cancer in her maternal grandfather; Rheum arthritis in her maternal grandmother and sister; Uterine cancer in her mother  Social History: Former Smoker (Quit: 07/19/1988 - 0.5 ppd x14 years)    Diet: Still adding some salt to diet. Down to 2-3 cups of coffee a day (previously 4 cups daily), down to 0.5-1 energy drink a day (previously 2 drinks daily). Eating chicken, sometimes hamburger, occasional steak. Needs to work on cutting down carbs and fats. Steak once a month. Tuna, salmon, pancakes, hamburgers, salads.  Does not eat breakfast  Exercise: limited for now as she recovers from knee surgery. Typically would walk with dog for 0.5 mile to 3-5 blocks, gardening  Wt Readings from Last 3 Encounters:  02/11/21 117 lb (53.1 kg)  12/16/20 114 lb 12.8 oz (52.1 kg)  10/30/20 119 lb (54 kg)   BP Readings from Last 3 Encounters:  02/15/21 (!) 143/76  12/30/20 132/78  12/16/20 (!) 150/80   Pulse Readings from Last 3 Encounters:  02/15/21 81  12/16/20 63  11/28/20 77    Renal function: CrCl cannot be calculated (Patient's most recent lab result is older than the maximum 21 days allowed.).  Past Medical History:  Diagnosis Date   Arthritis    in the neck   Carotid artery occlusion    Epilepsy (HCC)    GERD (gastroesophageal reflux disease)    Glaucoma    Hyperlipidemia    Hypertension    Keratoconus of both eyes 1981   Macular degeneration    Migraines    Peripheral vascular disease (HCC)    carotid occlusion surgery  on left   Raynaud's disease    Retinal edema    Rheumatoid arthritis(714.0)    Stroke (HCC)    two mild strokes presumed from left carotid stenosis    Current Outpatient Medications on File Prior to Visit  Medication Sig Dispense Refill   amLODipine (NORVASC) 10 MG tablet Take 10 mg by mouth daily.     ascorbic Acid (VITAMIN C) 500 MG CPCR Take 500 mg by mouth daily.     aspirin 325 MG tablet Take 325 mg by mouth daily.     atorvastatin (LIPITOR) 40 MG tablet Take 40 mg by mouth daily.     botulinum toxin Type A (BOTOX) 100 units SOLR injection Inject IM into the head and neck muscles every 3 months by provider in office 2 vial 3    butalbital-acetaminophen-caffeine (FIORICET, ESGIC) 50-325-40 MG tablet Take 1 tablet by mouth every 6 (six) hours as needed for headache. 10 tablet 4   Calcium Carbonate-Vit D-Min (CALCIUM 1200 PO) Take 2 capsules by mouth daily.     chlorthalidone (HYGROTON) 25 MG tablet Take 1 tablet (25 mg total) by mouth daily. 30 tablet 11   Cholecalciferol (VITAMIN D PO) Take 1,000 Units by mouth daily.      Difluprednate (DUREZOL) 0.05 % EMUL 5 drops in left eye and 2 in right eye daily.     DILANTIN 100 MG ER capsule TAKE 3 CAPSULES BY MOUTH DAILY 270 capsule 3   docusate sodium (COLACE) 100 MG capsule Take 1 capsule (100 mg total) by mouth 2 (two) times daily. 10 capsule 0   Erenumab-aooe (AIMOVIG) 140 MG/ML SOAJ Inject 140 mg into the skin every 30 (thirty) days. 6 mL 0   Evolocumab (REPATHA SURECLICK) 140 MG/ML SOAJ Inject 1 pen into the skin every 14 (fourteen) days. 2 mL 11   ezetimibe (ZETIA) 10 MG tablet Take 1 tablet (10 mg total) by mouth daily. 90 tablet 3   famotidine (PEPCID) 20 MG tablet TAKE 1 TABLET(20 MG) BY MOUTH AT BEDTIME 90 tablet 1   feeding supplement (ENSURE ENLIVE / ENSURE PLUS) LIQD Take 237 mLs by mouth 2 (two) times daily between meals. 10000 mL 0   HUMIRA PEN 40 MG/0.4ML PNKT Inject 40 mg into the skin every 14 (fourteen) days. As directed     hypromellose (GENTEAL) 0.3 % GEL ophthalmic ointment as needed.     leflunomide (ARAVA) 20 MG tablet Take 20 mg by mouth daily.     LORazepam (ATIVAN) 0.5 MG tablet Take 0.5 mg by mouth daily as needed for anxiety or sleep.     losartan (COZAAR) 100 MG tablet Take 1 tablet (100 mg total) by mouth daily. 90 tablet 3   methocarbamol (ROBAXIN) 500 MG tablet Take 1 tablet (500 mg total) by mouth every 8 (eight) hours as needed for muscle spasms. 40 tablet 0   pantoprazole (PROTONIX) 40 MG tablet TAKE 1 TABLET(40 MG) BY MOUTH DAILY 90 tablet 3   polyethylene glycol (MIRALAX / GLYCOLAX) 17 g packet Take 17 g by mouth daily. 14 each 0    promethazine (PHENERGAN) 25 MG tablet Take 1 tablet (25 mg total) by mouth every 6 (six) hours as needed for nausea or vomiting. 30 tablet 11   RESTASIS 0.05 % ophthalmic emulsion 2 (two) times daily.     Rimegepant Sulfate (NURTEC) 75 MG TBDP Take 75 mg by mouth daily as needed. For migraines. Take as close to onset of migraine as possible. One daily maximum. 4 tablet  0   rosuvastatin (CRESTOR) 20 MG tablet Take 1 tablet (20 mg total) by mouth daily. 90 tablet 3   spironolactone (ALDACTONE) 25 MG tablet Take 0.5 tablets (12.5 mg total) by mouth daily. 15 tablet 2   SUMAtriptan (IMITREX) 6 MG/0.5ML SOLN injection Inject 0.5 mLs (6 mg total) into the skin every 2 (two) hours as needed for migraine or headache. Take one dose at headache onset, can take additional dose 2hrs later if needed. No more then 2 injections in 24hrs 6 mL 11   tacrolimus (PROGRAF) 1 MG capsule Take 1 mg by mouth 2 (two) times daily.     traMADol (ULTRAM) 50 MG tablet Take 1 tablet (50 mg total) by mouth every 6 (six) hours as needed for up to 24 doses for moderate pain. 24 tablet 0   Current Facility-Administered Medications on File Prior to Visit  Medication Dose Route Frequency Provider Last Rate Last Admin   0.9 %  sodium chloride infusion   Intravenous PRN Janetta Hora, PA-C        Allergies  Allergen Reactions   Morphine Other (See Comments)    Difficulty breathing   Morphine And Related Shortness Of Breath   Alphagan [Brimonidine]     Eyelid swelling, scratching Allergic to preservative contained in this med   Codeine Nausea And Vomiting   Dorzolamide Hcl-Timolol Mal     Inflammation of the eyelid, scratching Allergic to preservative contained in this med   Hydrocodone-Acetaminophen Nausea And Vomiting    But tolerates tylenol   Sulfa Antibiotics Rash    "Large bumps"   Sulfamethoxazole Rash     Assessment/Plan:  1. Hypertension - BP at goal <130/80 mmHg. Will continue spironolactone 12.5mg   daily, amlodipine 10mg  daily, losartan 100mg  daily, and chlorthalidone 25mg  daily. Pt aware to call clinic with any concerns.  2. Hyperlipidemia - LDL goal < 70 due to hx of stroke and PVD. Checking lipids today on rosuvastatin 20mg  daily, ezetimibe 10mg  daily, and Repatha 140mg  Q2W. Advised pt can try injecting Repatha into her upper outer thigh instead of her abdomen to see if this is easier. Also has atorvastatin on her med list - she will look at her pill bottles at home and make sure she is not taking this as well.   F/u with PharmD as needed.  Juleah Paradise E. Yuta Cipollone, PharmD, BCACP, CPP [facility] [address] [place],  Phone: [phone]; Fax: [phone] 03/18/2021 10:14 AM

## 2021-03-18 NOTE — Patient Instructions (Addendum)
Blood pressure: Your goal is < 130/71mmHg Continue taking your current medications: -amlodipine 10mg  daily (AM) -chlorthalidone 25mg  daily (AM) -losartan 100mg  daily (AM) -spironolactone 12.5mg  daily (AM)  Cholesterol: Your LDL goal is < 70 Continue taking your current medications: -rosuvastatin 20mg  daily -ezetimibe 10mg  daily -Repatha 140mg  every 2 weeks - try injecting in your upper outer thigh -make sure you are not also taking atorvastatin  Call clinic with any concerns #[phone]

## 2021-03-19 ENCOUNTER — Telehealth: Payer: Self-pay | Admitting: Pharmacist

## 2021-03-19 NOTE — Telephone Encounter (Signed)
Pt c/o medication issue:  1. Name of Medication: Evolocumab (REPATHA SURECLICK) 140 MG/ML SOAJ  2. How are you currently taking this medication (dosage and times per day)? Injects every 14 days  3. Are you having a reaction (difficulty breathing--STAT)? no  4. What is your medication issue? Patient states the Repatha company has been trying to contact the office to replace the shots that were not working. She states they need to verify she has a prescription. She says she assumed they were processing it, but received an email yesterday that said they tried numerous times to contact the office and were unsuccessful. She says once she shoots the injection tomorrow she will be out. She would like someone to contact the Repatha company at: [phone]

## 2021-03-19 NOTE — Telephone Encounter (Signed)
Verbal order given to [facility] to replace Repatha pen, pt is aware.

## 2021-03-24 ENCOUNTER — Encounter: Payer: Self-pay | Admitting: Neurology

## 2021-03-24 ENCOUNTER — Ambulatory Visit: Payer: Medicare PPO | Admitting: Neurology

## 2021-03-24 VITALS — BP 128/74 | HR 79 | Ht 62.0 in | Wt 117.0 lb

## 2021-03-24 DIAGNOSIS — I639 Cerebral infarction, unspecified: Secondary | ICD-10-CM

## 2021-03-24 DIAGNOSIS — G44019 Episodic cluster headache, not intractable: Secondary | ICD-10-CM

## 2021-03-24 DIAGNOSIS — G4701 Insomnia due to medical condition: Secondary | ICD-10-CM

## 2021-03-24 DIAGNOSIS — R519 Headache, unspecified: Secondary | ICD-10-CM

## 2021-03-24 DIAGNOSIS — G43719 Chronic migraine without aura, intractable, without status migrainosus: Secondary | ICD-10-CM

## 2021-03-24 NOTE — Progress Notes (Signed)
SLEEP MEDICINE CLINIC    Provider:  Melvyn Novas, MD  Primary Care Physician:  Irena Reichmann, DO [address] [place] [address] [place] [address]     Referring Provider: Naomie Dean, MD         Chief Complaint according to patient   Patient presents with:     New Patient (Initial Visit)           HISTORY OF PRESENT ILLNESS:  Brianna [name] is a 70 y.o.  Caucasian female patient seen here as a referral on 03/24/2021 from Dr Lucia Gaskins for a sleep consultation.  Chief concern according to patient :  long term migraine patient who also has several eye medical problems, woken by pounding headaches, out of sleep, not just headaches being present . For CVA she was seen by Dr .Buzzy Han, Love, sumner, and now Tobias.    I have the pleasure of seeing Brianna [name] today, a right -handed White or Caucasian female with a possible sleep disorder.  She has a  has a past medical history of Arthritis, Carotid artery occlusion, Epilepsy (HCC), GERD (gastroesophageal reflux disease), Glaucoma, Hyperlipidemia, Hypertension, Keratoconus of both eyes (1981), Macular degeneration, Migraines, Peripheral vascular disease (HCC), Raynaud's disease, Retinal edema, Rheumatoid arthritis(714.0), and Stroke (HCC).    Sleep relevant medical history: Tonsillectomy at age 69 , sinus problems and deviated septum nasi.   Family medical /sleep history: no  other family member on CPAP with OSA, insomnia, sleep walkers.    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 Tobacco use: remote history until age 89 .  ETOH use ; one drink a month or less- ,  Caffeine intake in form of Coffee( 6 cups / day- all day) Soda( /) Tea ( /) or energy drinks. Regular exercise in form of PT  , stopped gym in 2020.  Hobbies : gardening,       Sleep habits are as follows: [name] dinner time is between 5-6 PM. The  patient goes to bed at 9 PM and continues to sleep for intervals of 2-3  hours, wakes 2-3 of these. The preferred sleep position is currently on her back due to knee surgery July 28th.  , with the support of 1 pillows.  Dreams are reportedly infrequent/vivid.  7.30-8   AM is the usual rise time. The patient wakes up spontaneously.   She reports not feeling refreshed or restored for long  in AM, with symptoms such as dry mouth, morning headaches, cluster headaches, migraines, , and residual fatigue.  Naps are taken infrequently after lunch ,  only if sick or exhausted lasting from  to 30-90 minutes .   Review of Systems: Out of a complete 14 system review, the patient complains of only the following symptoms, and all other reviewed systems are negative.:  Fatigue, sleepiness , snoring, fragmented sleep,nocturia, times 2-3   Arthritis Underweight  Knee surgery , left recently    How likely are you to doze in the following situations: 0 = not likely, 1 = slight chance, 2 = moderate chance, 3 = high chance   Sitting and Reading? Watching Television? Sitting inactive in a public place (theater or meeting)? As a passenger in a car for an hour without a break? Lying down in the afternoon when circumstances permit? Sitting and talking to someone? Sitting quietly after lunch without  alcohol? In a car, while stopped for a few minutes in traffic?   Total =   Sleep related and unrelated headaches, cluster,  Fatigue with Rheumatology arthritis. 04 / 24 points   FSS endorsed at 40/ 63 points.   Social History   Socioeconomic History   Marital status: Married    Spouse name: [name]   Number of children: 2   Years of education: Bachelor   Highest education level: Bachelor's degree (e.g., BA, AB, BS)  Occupational History   Occupation: Runner, broadcasting/film/video   Occupation: AG TEACHER    Employer: [employer]    Comment: retired  Tobacco Use   Smoking status: Former    Packs/day: 0.50     Years: 14.00    Pack years: 7.00    Types: Cigarettes    Quit date: 1989    Years since quitting: 33.7   Smokeless tobacco: Never  Vaping Use   Vaping Use: Never used  Substance and Sexual Activity   Alcohol use: No   Drug use: No   Sexual activity: Not on file  Other Topics Concern   Not on file  Social History Narrative   Patient is married to Alma), has 2 children   Patient is right handed   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   Caffeine consumption is about 2 cups daily   Lives at home with husband    Social Determinants of Health   Financial Resource Strain: Not on file  Food Insecurity: Not on file  Transportation Needs: Not on file  Physical Activity: Not on file  Stress: Not on file  Social Connections: Not on file    Family History  Problem Relation Age of Onset   Heart disease Mother    Lung disease Mother        ? disease process   Uterine cancer Mother    Heart disease Father    Clotting disorder Father    Collagen disease Father    Cervical cancer Maternal Aunt    Prostate cancer Maternal Grandfather    Rheum arthritis Sister    High Cholesterol Sister    Epilepsy Sister    Rheum arthritis Maternal Grandmother    High Cholesterol Brother    High blood pressure Brother    High blood pressure Brother    High Cholesterol Brother    High blood pressure Brother    High Cholesterol Brother     Past Medical History:  Diagnosis Date   Arthritis    in the neck   Carotid artery occlusion    Epilepsy (HCC)    GERD (gastroesophageal reflux disease)    Glaucoma    Hyperlipidemia    Hypertension    Keratoconus of both eyes 1981   Macular degeneration    Migraines    Peripheral vascular disease (HCC)    carotid occlusion surgery on left   Raynaud's disease    Retinal edema    Rheumatoid arthritis(714.0)    Stroke (HCC)    two mild strokes presumed from left carotid stenosis    Past Surgical History:  Procedure Laterality Date   CAROTID  ARTERY ANGIOPLASTY  2008   Dr Madilyn Fireman   CAROTID ENDARTERECTOMY Left 11-15-2007   cataract extraction Right 04-2014   [facility]   CHOLECYSTECTOMY  1995   CORNEAL TRANSPLANT     x 5 ; steroid inj. 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   [facility]   CORNEAL TRANSPLANT Left 04/17/2019   EYE SURGERY  Right 12/2016   cornea repair    EYE SURGERY Right 09/20/2017   cornea transplant    GLAUCOMA SURGERY Right 2018   ORIF PATELLA Left 02/12/2021   Procedure: OPEN REDUCTION INTERNAL (ORIF) FIXATION PATELLA;  Surgeon: Yolonda Kida, MD;  Location: [facility] OR;  Service: Orthopedics;  Laterality: Left;   TRIGGER FINGER RELEASE Right 05/16/2018   Procedure: RELEASE TRIGGER FINGER/A-1 PULLEY RIGHT THUMB;  Surgeon: Cindee Salt, MD;  Location: [facility];  Service: Orthopedics;  Laterality: Right;     Current Outpatient Medications on File Prior to Visit  Medication Sig Dispense Refill   amLODipine (NORVASC) 10 MG tablet Take 1 tablet (10 mg total) by mouth daily. 90 tablet 3   ascorbic Acid (VITAMIN C) 500 MG CPCR Take 500 mg by mouth daily.     aspirin 325 MG tablet Take 325 mg by mouth daily.     botulinum toxin Type A (BOTOX) 100 units SOLR injection Inject IM into the head and neck muscles every 3 months by provider in office 2 vial 3   butalbital-acetaminophen-caffeine (FIORICET, ESGIC) 50-325-40 MG tablet Take 1 tablet by mouth every 6 (six) hours as needed for headache. 10 tablet 4   Calcium Carbonate-Vit D-Min (CALCIUM 1200 PO) Take 2 capsules by mouth daily.     chlorthalidone (HYGROTON) 25 MG tablet Take 1 tablet (25 mg total) by mouth daily. 90 tablet 3   Cholecalciferol (VITAMIN D PO) Take 1,000 Units by mouth daily.      Difluprednate (DUREZOL) 0.05 % EMUL 5 drops in left eye and 2 in right eye daily.     DILANTIN 100 MG ER capsule TAKE 3 CAPSULES BY MOUTH DAILY 270 capsule 3   docusate sodium (COLACE) 100 MG capsule Take 1 capsule (100 mg total) by  mouth 2 (two) times daily. 10 capsule 0   Erenumab-aooe (AIMOVIG) 140 MG/ML SOAJ Inject 140 mg into the skin every 30 (thirty) days. 6 mL 0   Evolocumab (REPATHA SURECLICK) 140 MG/ML SOAJ Inject 1 pen into the skin every 14 (fourteen) days. 2 mL 11   ezetimibe (ZETIA) 10 MG tablet Take 1 tablet (10 mg total) by mouth daily. 90 tablet 3   famotidine (PEPCID) 20 MG tablet TAKE 1 TABLET(20 MG) BY MOUTH AT BEDTIME 90 tablet 1   feeding supplement (ENSURE ENLIVE / ENSURE PLUS) LIQD Take 237 mLs by mouth 2 (two) times daily between meals. 10000 mL 0   HUMIRA PEN 40 MG/0.4ML PNKT Inject 40 mg into the skin every 14 (fourteen) days. As directed     hypromellose (GENTEAL) 0.3 % GEL ophthalmic ointment as needed.     leflunomide (ARAVA) 20 MG tablet Take 20 mg by mouth daily.     LORazepam (ATIVAN) 0.5 MG tablet Take 0.5 mg by mouth daily as needed for anxiety or sleep.     losartan (COZAAR) 100 MG tablet Take 1 tablet (100 mg total) by mouth daily. 90 tablet 3   methocarbamol (ROBAXIN) 500 MG tablet Take 1 tablet (500 mg total) by mouth every 8 (eight) hours as needed for muscle spasms. 40 tablet 0   pantoprazole (PROTONIX) 40 MG tablet TAKE 1 TABLET(40 MG) BY MOUTH DAILY 90 tablet 3   polyethylene glycol (MIRALAX / GLYCOLAX) 17 g packet Take 17 g by mouth daily. 14 each 0   promethazine (PHENERGAN) 25 MG tablet Take 1 tablet (25 mg total) by mouth every 6 (six) hours as needed for nausea or vomiting. 30 tablet 11  RESTASIS 0.05 % ophthalmic emulsion 2 (two) times daily.     Rimegepant Sulfate (NURTEC) 75 MG TBDP Take 75 mg by mouth daily as needed. For migraines. Take as close to onset of migraine as possible. One daily maximum. 4 tablet 0   rosuvastatin (CRESTOR) 20 MG tablet Take 1 tablet (20 mg total) by mouth daily. 90 tablet 3   spironolactone (ALDACTONE) 25 MG tablet Take 0.5 tablets (12.5 mg total) by mouth daily. 45 tablet 3   SUMAtriptan (IMITREX) 6 MG/0.5ML SOLN injection Inject 0.5 mLs (6 mg  total) into the skin every 2 (two) hours as needed for migraine or headache. Take one dose at headache onset, can take additional dose 2hrs later if needed. No more then 2 injections in 24hrs 6 mL 11   tacrolimus (PROGRAF) 1 MG capsule Take 1 mg by mouth 2 (two) times daily.     traMADol (ULTRAM) 50 MG tablet Take 1 tablet (50 mg total) by mouth every 6 (six) hours as needed for up to 24 doses for moderate pain. 24 tablet 0   Current Facility-Administered Medications on File Prior to Visit  Medication Dose Route Frequency Provider Last Rate Last Admin   0.9 %  sodium chloride infusion   Intravenous PRN Janetta Hora, PA-C        Allergies  Allergen Reactions   Morphine Other (See Comments)    Difficulty breathing   Morphine And Related Shortness Of Breath   Alphagan [Brimonidine]     Eyelid swelling, scratching Allergic to preservative contained in this med   Codeine Nausea And Vomiting   Dorzolamide Hcl-Timolol Mal     Inflammation of the eyelid, scratching Allergic to preservative contained in this med   Hydrocodone-Acetaminophen Nausea And Vomiting    But tolerates tylenol   Sulfa Antibiotics Rash    "Large bumps"   Sulfamethoxazole Rash    Physical exam:  Today's Vitals   03/24/21 1014  BP: 128/74  Pulse: 79  Weight: 117 lb (53.1 kg)  Height:  (1.575 m)   Body mass index is 21.4 kg/m.   Wt Readings from Last 3 Encounters:  03/24/21 117 lb (53.1 kg)  02/11/21 117 lb (53.1 kg)  12/16/20 114 lb 12.8 oz (52.1 kg)     Ht Readings from Last 3 Encounters:  03/24/21  (1.575 m)  02/11/21  (1.575 m)  10/30/20  (1.575 m)      General: The patient is awake, alert and appears not in acute distress. The patient is well groomed. Head: Normocephalic, atraumatic. Neck is supple.  Mallampati 1-   neck circumference:13 inches . Nasal airflow  patent.  Retrognathia is  seen.  Dental status: crowded, peaked palate  Cardiovascular:  Regular rate and  cardiac rhythm by pulse,  without distended neck veins. Respiratory: Lungs are clear to auscultation.  Skin:  Without evidence of ankle edema, or rash. Trunk: [name] posture is erect.   Neurologic exam : The patient is awake and alert, oriented to place and time.   Memory subjective described as intact.  Attention span & concentration ability appears normal.  Speech is fluent,  without  dysarthria, dysphonia or aphasia.  Mood and affect are appropriate.   Cranial nerves: no loss of smell or taste reported  Pupils are uneven- permanently dilated pupil, disrounded on the left, clouded cornea, neo-vascularization. CERATOCONUS at baseline, steroids caused cataract, glaucoma, RA affected retinea. Funduscopic exam deferred. .  Extraocular movements in vertical and horizontal planes  were intact and without nystagmus.  Hearing was intact to soft voice and finger rubbing.    Facial sensation intact to fine touch.  Facial motor strength is symmetric and tongue and uvula move midline.  Neck ROM : rotation, tilt and flexion extension were normal for age and shoulder shrug was symmetrical.    Motor exam:  Symmetric bulk, tone and ROM.  history of right hemiparesis,  Normal tone without cog wheeling, symmetric grip strength .   Sensory:  Fine touch, pinprick and vibration were   normal.  Proprioception tested in the upper extremities was normal.   Coordination: Rapid alternating movements in the fingers/hands were of normal speed.  The Finger-to-nose maneuver was intact without evidence of ataxia, dysmetria or tremor.   Gait and station: Patient in wheelchair  Deep tendon reflexes: in the upper extremities are symmetric and intact.  Babinski response was deferred.       After spending a total time of  45  minutes face to face and additional time for physical and neurologic examination, review of laboratory studies,  personal review of imaging studies, reports and results of other testing  and review of referral information / records as far as provided in visit, I have established the following assessments:  1) this patient has more risk factors for CSA than OSA. 2) cluster and migraines, morning headaches, frequent 3) current chronic pain at the left knee is affecting sleep quality. Tramadol was initiated and helped sleep, now mostly on muscle relaxer. Left knee fracture of the patella   My Plan is to proceed with:  1) HST for screening of sleep apnea.  2)I will also order a PSG.    I would like to thank Irena Reichmann, DO and Irena Reichmann, Do [address] [place],  [place] [address] for allowing me to meet with and to take care of this pleasant patient.   In short, Brianna [name] is presenting with possible pain related insomnia, risk of central apnea, high caffeine use and muscle relaxant use. CLUSTER and morning headache a symptom that can be attributed to hypoxia and caffeine use  I plan to follow up either personally or through our NP within 2-4  month.   CC: I will share my notes with  dr Lucia Gaskins .  Electronically signed by: Melvyn Novas, MD 03/24/2021 10:27 AM  [facility] Board certified by The [employer] of Sleep Medicine and Diplomate of the [employer] of Sleep Medicine. Board certified In Neurology through the ABPN, Fellow of the [employer] of Neu[employer]. Medical Director of [facility].

## 2021-03-24 NOTE — Patient Instructions (Signed)
Quality Sleep Information, Adult Quality sleep is important for your mental and physical health. It also improves your quality of life. Quality sleep means you: Are asleep for most of the time you are in bed. Fall asleep within 30 minutes. Wake up no more than once a night.  Are awake for no longer than 20 minutes if you do wake up during the night. Most adults need 7-8 hours of quality sleep each night. How can poor sleep affect me? If you do not get enough quality sleep, you may have: Mood swings. Daytime sleepiness. Confusion. Decreased reaction time. Sleep disorders, such as insomnia and sleep apnea. Difficulty with: Solving problems. Coping with stress. Paying attention. These issues may affect your performance and productivity at work, school, and at home. Lack of sleep may also put you at higher risk for accidents, suicide,and risky behaviors. If you do not get quality sleep you may also be at higher risk for several health problems, including: Infections. Type 2 diabetes. Heart disease. High blood pressure. Obesity. Worsening of long-term conditions, like arthritis, kidney disease, depression, Parkinson's disease, and epilepsy. What actions can I take to get more quality sleep?     Stick to a sleep schedule. Go to sleep and wake up at about the same time each day. Do not try to sleep less on weekdays and make up for lost sleep on weekends. This does not work. Try to get about 30 minutes of exercise on most days. Do not exercise 2-3 hours before going to bed. Limit naps during the day to 30 minutes or less. Do not use any products that contain nicotine or tobacco, such as cigarettes or e-cigarettes. If you need help quitting, ask your health care provider. Do not drink caffeinated beverages for at least 8 hours before going to bed. Coffee, tea, and some sodas contain caffeine. Do not drink alcohol close to bedtime. Do not eat large meals close to bedtime. Do not take naps  in the late afternoon. Try to get at least 30 minutes of sunlight every day. Morning sunlight is best. Make time to relax before bed. Reading, listening to music, or taking a hot bath promotes quality sleep. Make your bedroom a place that promotes quality sleep. Keep your bedroom dark, quiet, and at a comfortable room temperature. Make sure your bed is comfortable. Take out sleep distractions like TV, a computer, smartphone, and bright lights. If you are lying awake in bed for longer than 20 minutes, get up and do a relaxing activity until you feel sleepy. Work with your health care provider to treat medical conditions that may affect sleeping, such as: Nasal obstruction. Snoring. Sleep apnea and other sleep disorders. Talk to your health care provider if you think any of your prescription medicines may cause you to have difficulty falling or staying asleep. If you have sleep problems, talk with a sleep consultant. If you think you have a sleep disorder, talk with your health care provider about getting evaluated by a specialist. Where to find more information National Sleep Foundation website: https://sleepfoundation.org National Heart, Lung, and Blood Institute (NHLBI): www.nhlbi.nih.gov/files/docs/public/sleep/healthy_sleep.pdf Centers for Disease Control and Prevention (CDC): www.cdc.gov/sleep/index.html Contact a health care provider if you: Have trouble getting to sleep or staying asleep. Often wake up very early in the morning and cannot get back to sleep. Have daytime sleepiness. Have daytime sleep attacks of suddenly falling asleep and sudden muscle weakness (narcolepsy). Have a tingling sensation in your legs with a strong urge to move your legs (  restless legs syndrome). Stop breathing briefly during sleep (sleep apnea). Think you have a sleep disorder or are taking a medicine that is affecting your quality of sleep. Summary Most adults need 7-8 hours of quality sleep each  night. Getting enough quality sleep is an important part of health and well-being. Make your bedroom a place that promotes quality sleep and avoid things that may cause you to have poor sleep, such as alcohol, caffeine, smoking, and large meals. Talk to your health care provider if you have trouble falling asleep or staying asleep. This information is not intended to replace advice given to you by your health care provider. Make sure you discuss any questions you have with your healthcare provider. Document Revised: 10/12/2017 Document Reviewed: 10/12/2017 Elsevier Patient Education  2022 [employer]. Insomnia Insomnia is a sleep disorder that makes it difficult to fall asleep or stay asleep. Insomnia can cause fatigue, low energy, difficulty concentrating, moodswings, and poor performance at work or school. There are three different ways to classify insomnia: Difficulty falling asleep. Difficulty staying asleep. Waking up too early in the morning. Any type of insomnia can be long-term (chronic) or short-term (acute). Both are common. Short-term insomnia usually lasts for three months or less. Chronic insomnia occurs at least three times a week for longer than threemonths. What are the causes? Insomnia may be caused by another condition, situation, or substance, such as: Anxiety. Certain medicines. Gastroesophageal reflux disease (GERD) or other gastrointestinal conditions. Asthma or other breathing conditions. Restless legs syndrome, sleep apnea, or other sleep disorders. Chronic pain. Menopause. Stroke. Abuse of alcohol, tobacco, or illegal drugs. Mental health conditions, such as depression. Caffeine. Neurological disorders, such as Alzheimer's disease. An overactive thyroid (hyperthyroidism). Sometimes, the cause of insomnia may not be known. What increases the risk? Risk factors for insomnia include: Gender. Women are affected more often than men. Age. Insomnia is more common  as you get older. Stress. Lack of exercise. Irregular work schedule or working night shifts. Traveling between different time zones. Certain medical and mental health conditions. What are the signs or symptoms? If you have insomnia, the main symptom is having trouble falling asleep or having trouble staying asleep. This may lead to other symptoms, such as: Feeling fatigued or having low energy. Feeling nervous about going to sleep. Not feeling rested in the morning. Having trouble concentrating. Feeling irritable, anxious, or depressed. How is this diagnosed? This condition may be diagnosed based on: Your symptoms and medical history. Your health care provider may ask about: Your sleep habits. Any medical conditions you have. Your mental health. A physical exam. How is this treated? Treatment for insomnia depends on the cause. Treatment may focus on treating an underlying condition that is causing insomnia. Treatment may also include: Medicines to help you sleep. Counseling or therapy. Lifestyle adjustments to help you sleep better. Follow these instructions at home: Eating and drinking  Limit or avoid alcohol, caffeinated beverages, and cigarettes, especially close to bedtime. These can disrupt your sleep. Do not eat a large meal or eat spicy foods right before bedtime. This can lead to digestive discomfort that can make it hard for you to sleep.  Sleep habits  Keep a sleep diary to help you and your health care provider figure out what could be causing your insomnia. Write down: When you sleep. When you wake up during the night. How well you sleep. How rested you feel the next day. Any side effects of medicines you are taking. What you eat   and drink. Make your bedroom a dark, comfortable place where it is easy to fall asleep. Put up shades or blackout curtains to block light from outside. Use a white noise machine to block noise. Keep the temperature cool. Limit screen  use before bedtime. This includes: Watching TV. Using your smartphone, tablet, or computer. Stick to a routine that includes going to bed and waking up at the same times every day and night. This can help you fall asleep faster. Consider making a quiet activity, such as reading, part of your nighttime routine. Try to avoid taking naps during the day so that you sleep better at night. Get out of bed if you are still awake after 15 minutes of trying to sleep. Keep the lights down, but try reading or doing a quiet activity. When you feel sleepy, go back to bed.  General instructions Take over-the-counter and prescription medicines only as told by your health care provider. Exercise regularly, as told by your health care provider. Avoid exercise starting several hours before bedtime. Use relaxation techniques to manage stress. Ask your health care provider to suggest some techniques that may work well for you. These may include: Breathing exercises. Routines to release muscle tension. Visualizing peaceful scenes. Make sure that you drive carefully. Avoid driving if you feel very sleepy. Keep all follow-up visits as told by your health care provider. This is important. Contact a health care provider if: You are tired throughout the day. You have trouble in your daily routine due to sleepiness. You continue to have sleep problems, or your sleep problems get worse. Get help right away if: You have serious thoughts about hurting yourself or someone else. If you ever feel like you may hurt yourself or others, or have thoughts about taking your own life, get help right away. You can go to your nearest emergency department or call: Your local emergency services (911 in the [place] A suicide crisis helpline, such as the National Suicide Prevention Lifeline at 1-800-273-8255. This is open 24 hours a day. Summary Insomnia is a sleep disorder that makes it difficult to fall asleep or stay asleep. Insomnia  can be long-term (chronic) or short-term (acute). Treatment for insomnia depends on the cause. Treatment may focus on treating an underlying condition that is causing insomnia. Keep a sleep diary to help you and your health care provider figure out what could be causing your insomnia. This information is not intended to replace advice given to you by your health care provider. Make sure you discuss any questions you have with your healthcare provider. Document Revised: 05/15/2020 Document Reviewed: 05/15/2020 Elsevier Patient Education  2022 Elsevier Inc.  

## 2021-03-26 DIAGNOSIS — I10 Essential (primary) hypertension: Secondary | ICD-10-CM | POA: Diagnosis not present

## 2021-03-26 DIAGNOSIS — M47812 Spondylosis without myelopathy or radiculopathy, cervical region: Secondary | ICD-10-CM | POA: Diagnosis not present

## 2021-03-26 DIAGNOSIS — H5462 Unqualified visual loss, left eye, normal vision right eye: Secondary | ICD-10-CM | POA: Diagnosis not present

## 2021-03-26 DIAGNOSIS — G43019 Migraine without aura, intractable, without status migrainosus: Secondary | ICD-10-CM | POA: Diagnosis not present

## 2021-03-26 DIAGNOSIS — M059 Rheumatoid arthritis with rheumatoid factor, unspecified: Secondary | ICD-10-CM | POA: Diagnosis not present

## 2021-03-26 DIAGNOSIS — G40909 Epilepsy, unspecified, not intractable, without status epilepticus: Secondary | ICD-10-CM | POA: Diagnosis not present

## 2021-03-26 DIAGNOSIS — D500 Iron deficiency anemia secondary to blood loss (chronic): Secondary | ICD-10-CM | POA: Diagnosis not present

## 2021-03-26 DIAGNOSIS — S82032D Displaced transverse fracture of left patella, subsequent encounter for closed fracture with routine healing: Secondary | ICD-10-CM | POA: Diagnosis not present

## 2021-03-26 DIAGNOSIS — I7300 Raynaud's syndrome without gangrene: Secondary | ICD-10-CM | POA: Diagnosis not present

## 2021-03-30 DIAGNOSIS — G40909 Epilepsy, unspecified, not intractable, without status epilepticus: Secondary | ICD-10-CM | POA: Diagnosis not present

## 2021-03-30 DIAGNOSIS — I7300 Raynaud's syndrome without gangrene: Secondary | ICD-10-CM | POA: Diagnosis not present

## 2021-03-30 DIAGNOSIS — H5462 Unqualified visual loss, left eye, normal vision right eye: Secondary | ICD-10-CM | POA: Diagnosis not present

## 2021-03-30 DIAGNOSIS — M059 Rheumatoid arthritis with rheumatoid factor, unspecified: Secondary | ICD-10-CM | POA: Diagnosis not present

## 2021-03-30 DIAGNOSIS — M47812 Spondylosis without myelopathy or radiculopathy, cervical region: Secondary | ICD-10-CM | POA: Diagnosis not present

## 2021-03-30 DIAGNOSIS — G43019 Migraine without aura, intractable, without status migrainosus: Secondary | ICD-10-CM | POA: Diagnosis not present

## 2021-03-30 DIAGNOSIS — I10 Essential (primary) hypertension: Secondary | ICD-10-CM | POA: Diagnosis not present

## 2021-03-30 DIAGNOSIS — D500 Iron deficiency anemia secondary to blood loss (chronic): Secondary | ICD-10-CM | POA: Diagnosis not present

## 2021-03-30 DIAGNOSIS — S82032D Displaced transverse fracture of left patella, subsequent encounter for closed fracture with routine healing: Secondary | ICD-10-CM | POA: Diagnosis not present

## 2021-03-30 NOTE — Telephone Encounter (Signed)
Called patient for update on any bills she has received for her knee surgery that she states she had in late July. She has only received ambulance bill.  I requested that she reach back out to our clinic once she has gathered documents. She also has been advised that we may have to start [employer] PAP application from scratch since it has been at least 4 months since the original denial. She verbalized understanding. Will await response from patient  Chesley Mires, PharmD, MPH, BCPS Clinical Pharmacist (Rheumatology and Pulmonology)

## 2021-04-01 DIAGNOSIS — Z4789 Encounter for other orthopedic aftercare: Secondary | ICD-10-CM | POA: Diagnosis not present

## 2021-04-01 DIAGNOSIS — Z471 Aftercare following joint replacement surgery: Secondary | ICD-10-CM | POA: Diagnosis not present

## 2021-04-02 DIAGNOSIS — I10 Essential (primary) hypertension: Secondary | ICD-10-CM | POA: Diagnosis not present

## 2021-04-02 DIAGNOSIS — D500 Iron deficiency anemia secondary to blood loss (chronic): Secondary | ICD-10-CM | POA: Diagnosis not present

## 2021-04-02 DIAGNOSIS — M059 Rheumatoid arthritis with rheumatoid factor, unspecified: Secondary | ICD-10-CM | POA: Diagnosis not present

## 2021-04-02 DIAGNOSIS — G43019 Migraine without aura, intractable, without status migrainosus: Secondary | ICD-10-CM | POA: Diagnosis not present

## 2021-04-02 DIAGNOSIS — S82032D Displaced transverse fracture of left patella, subsequent encounter for closed fracture with routine healing: Secondary | ICD-10-CM | POA: Diagnosis not present

## 2021-04-02 DIAGNOSIS — H5462 Unqualified visual loss, left eye, normal vision right eye: Secondary | ICD-10-CM | POA: Diagnosis not present

## 2021-04-02 DIAGNOSIS — M47812 Spondylosis without myelopathy or radiculopathy, cervical region: Secondary | ICD-10-CM | POA: Diagnosis not present

## 2021-04-02 DIAGNOSIS — I7300 Raynaud's syndrome without gangrene: Secondary | ICD-10-CM | POA: Diagnosis not present

## 2021-04-02 DIAGNOSIS — G40909 Epilepsy, unspecified, not intractable, without status epilepticus: Secondary | ICD-10-CM | POA: Diagnosis not present

## 2021-04-06 DIAGNOSIS — S82032D Displaced transverse fracture of left patella, subsequent encounter for closed fracture with routine healing: Secondary | ICD-10-CM | POA: Diagnosis not present

## 2021-04-06 DIAGNOSIS — I7300 Raynaud's syndrome without gangrene: Secondary | ICD-10-CM | POA: Diagnosis not present

## 2021-04-06 DIAGNOSIS — I10 Essential (primary) hypertension: Secondary | ICD-10-CM | POA: Diagnosis not present

## 2021-04-06 DIAGNOSIS — M059 Rheumatoid arthritis with rheumatoid factor, unspecified: Secondary | ICD-10-CM | POA: Diagnosis not present

## 2021-04-06 DIAGNOSIS — H5462 Unqualified visual loss, left eye, normal vision right eye: Secondary | ICD-10-CM | POA: Diagnosis not present

## 2021-04-06 DIAGNOSIS — G40909 Epilepsy, unspecified, not intractable, without status epilepticus: Secondary | ICD-10-CM | POA: Diagnosis not present

## 2021-04-06 DIAGNOSIS — G43019 Migraine without aura, intractable, without status migrainosus: Secondary | ICD-10-CM | POA: Diagnosis not present

## 2021-04-06 DIAGNOSIS — D500 Iron deficiency anemia secondary to blood loss (chronic): Secondary | ICD-10-CM | POA: Diagnosis not present

## 2021-04-06 DIAGNOSIS — M47812 Spondylosis without myelopathy or radiculopathy, cervical region: Secondary | ICD-10-CM | POA: Diagnosis not present

## 2021-04-07 DIAGNOSIS — I7300 Raynaud's syndrome without gangrene: Secondary | ICD-10-CM | POA: Diagnosis not present

## 2021-04-07 DIAGNOSIS — G40909 Epilepsy, unspecified, not intractable, without status epilepticus: Secondary | ICD-10-CM | POA: Diagnosis not present

## 2021-04-07 DIAGNOSIS — I10 Essential (primary) hypertension: Secondary | ICD-10-CM | POA: Diagnosis not present

## 2021-04-07 DIAGNOSIS — S82032D Displaced transverse fracture of left patella, subsequent encounter for closed fracture with routine healing: Secondary | ICD-10-CM | POA: Diagnosis not present

## 2021-04-07 DIAGNOSIS — D500 Iron deficiency anemia secondary to blood loss (chronic): Secondary | ICD-10-CM | POA: Diagnosis not present

## 2021-04-07 DIAGNOSIS — G43019 Migraine without aura, intractable, without status migrainosus: Secondary | ICD-10-CM | POA: Diagnosis not present

## 2021-04-07 DIAGNOSIS — M47812 Spondylosis without myelopathy or radiculopathy, cervical region: Secondary | ICD-10-CM | POA: Diagnosis not present

## 2021-04-07 DIAGNOSIS — M059 Rheumatoid arthritis with rheumatoid factor, unspecified: Secondary | ICD-10-CM | POA: Diagnosis not present

## 2021-04-07 DIAGNOSIS — H5462 Unqualified visual loss, left eye, normal vision right eye: Secondary | ICD-10-CM | POA: Diagnosis not present

## 2021-04-08 DIAGNOSIS — Z809 Family history of malignant neoplasm, unspecified: Secondary | ICD-10-CM | POA: Diagnosis not present

## 2021-04-09 DIAGNOSIS — S82032D Displaced transverse fracture of left patella, subsequent encounter for closed fracture with routine healing: Secondary | ICD-10-CM | POA: Diagnosis not present

## 2021-04-09 DIAGNOSIS — D500 Iron deficiency anemia secondary to blood loss (chronic): Secondary | ICD-10-CM | POA: Diagnosis not present

## 2021-04-09 DIAGNOSIS — M059 Rheumatoid arthritis with rheumatoid factor, unspecified: Secondary | ICD-10-CM | POA: Diagnosis not present

## 2021-04-09 DIAGNOSIS — H5462 Unqualified visual loss, left eye, normal vision right eye: Secondary | ICD-10-CM | POA: Diagnosis not present

## 2021-04-09 DIAGNOSIS — I10 Essential (primary) hypertension: Secondary | ICD-10-CM | POA: Diagnosis not present

## 2021-04-09 DIAGNOSIS — G40909 Epilepsy, unspecified, not intractable, without status epilepticus: Secondary | ICD-10-CM | POA: Diagnosis not present

## 2021-04-09 DIAGNOSIS — G43019 Migraine without aura, intractable, without status migrainosus: Secondary | ICD-10-CM | POA: Diagnosis not present

## 2021-04-09 DIAGNOSIS — M47812 Spondylosis without myelopathy or radiculopathy, cervical region: Secondary | ICD-10-CM | POA: Diagnosis not present

## 2021-04-09 DIAGNOSIS — I7300 Raynaud's syndrome without gangrene: Secondary | ICD-10-CM | POA: Diagnosis not present

## 2021-04-13 DIAGNOSIS — H3321 Serous retinal detachment, right eye: Secondary | ICD-10-CM | POA: Diagnosis not present

## 2021-04-13 DIAGNOSIS — Z961 Presence of intraocular lens: Secondary | ICD-10-CM | POA: Diagnosis not present

## 2021-04-13 DIAGNOSIS — Z9841 Cataract extraction status, right eye: Secondary | ICD-10-CM | POA: Diagnosis not present

## 2021-04-13 DIAGNOSIS — H401112 Primary open-angle glaucoma, right eye, moderate stage: Secondary | ICD-10-CM | POA: Diagnosis not present

## 2021-04-13 DIAGNOSIS — H4440 Unspecified hypotony of eye: Secondary | ICD-10-CM | POA: Diagnosis not present

## 2021-04-13 DIAGNOSIS — H401123 Primary open-angle glaucoma, left eye, severe stage: Secondary | ICD-10-CM | POA: Diagnosis not present

## 2021-04-13 DIAGNOSIS — Z947 Corneal transplant status: Secondary | ICD-10-CM | POA: Diagnosis not present

## 2021-04-13 DIAGNOSIS — Z79899 Other long term (current) drug therapy: Secondary | ICD-10-CM | POA: Diagnosis not present

## 2021-04-14 DIAGNOSIS — H6123 Impacted cerumen, bilateral: Secondary | ICD-10-CM | POA: Diagnosis not present

## 2021-04-15 DIAGNOSIS — M059 Rheumatoid arthritis with rheumatoid factor, unspecified: Secondary | ICD-10-CM | POA: Diagnosis not present

## 2021-04-15 DIAGNOSIS — I7300 Raynaud's syndrome without gangrene: Secondary | ICD-10-CM | POA: Diagnosis not present

## 2021-04-15 DIAGNOSIS — G40909 Epilepsy, unspecified, not intractable, without status epilepticus: Secondary | ICD-10-CM | POA: Diagnosis not present

## 2021-04-15 DIAGNOSIS — S82032D Displaced transverse fracture of left patella, subsequent encounter for closed fracture with routine healing: Secondary | ICD-10-CM | POA: Diagnosis not present

## 2021-04-15 DIAGNOSIS — G43019 Migraine without aura, intractable, without status migrainosus: Secondary | ICD-10-CM | POA: Diagnosis not present

## 2021-04-15 DIAGNOSIS — M47812 Spondylosis without myelopathy or radiculopathy, cervical region: Secondary | ICD-10-CM | POA: Diagnosis not present

## 2021-04-15 DIAGNOSIS — H5462 Unqualified visual loss, left eye, normal vision right eye: Secondary | ICD-10-CM | POA: Diagnosis not present

## 2021-04-15 DIAGNOSIS — I10 Essential (primary) hypertension: Secondary | ICD-10-CM | POA: Diagnosis not present

## 2021-04-15 DIAGNOSIS — D500 Iron deficiency anemia secondary to blood loss (chronic): Secondary | ICD-10-CM | POA: Diagnosis not present

## 2021-04-20 DIAGNOSIS — M25562 Pain in left knee: Secondary | ICD-10-CM | POA: Diagnosis not present

## 2021-04-28 ENCOUNTER — Other Ambulatory Visit: Payer: Self-pay | Admitting: Internal Medicine

## 2021-04-28 ENCOUNTER — Other Ambulatory Visit: Payer: Self-pay | Admitting: Cardiology

## 2021-04-28 DIAGNOSIS — M25562 Pain in left knee: Secondary | ICD-10-CM | POA: Diagnosis not present

## 2021-04-28 DIAGNOSIS — R059 Cough, unspecified: Secondary | ICD-10-CM

## 2021-04-30 DIAGNOSIS — K5900 Constipation, unspecified: Secondary | ICD-10-CM | POA: Diagnosis not present

## 2021-04-30 DIAGNOSIS — Z1211 Encounter for screening for malignant neoplasm of colon: Secondary | ICD-10-CM | POA: Diagnosis not present

## 2021-04-30 DIAGNOSIS — K5730 Diverticulosis of large intestine without perforation or abscess without bleeding: Secondary | ICD-10-CM | POA: Diagnosis not present

## 2021-04-30 DIAGNOSIS — M25562 Pain in left knee: Secondary | ICD-10-CM | POA: Diagnosis not present

## 2021-04-30 DIAGNOSIS — K219 Gastro-esophageal reflux disease without esophagitis: Secondary | ICD-10-CM | POA: Diagnosis not present

## 2021-05-05 DIAGNOSIS — M25562 Pain in left knee: Secondary | ICD-10-CM | POA: Diagnosis not present

## 2021-05-07 DIAGNOSIS — M25562 Pain in left knee: Secondary | ICD-10-CM | POA: Diagnosis not present

## 2021-05-12 DIAGNOSIS — M25562 Pain in left knee: Secondary | ICD-10-CM | POA: Diagnosis not present

## 2021-05-13 DIAGNOSIS — Z4789 Encounter for other orthopedic aftercare: Secondary | ICD-10-CM | POA: Diagnosis not present

## 2021-05-14 DIAGNOSIS — M25562 Pain in left knee: Secondary | ICD-10-CM | POA: Diagnosis not present

## 2021-05-19 DIAGNOSIS — M25562 Pain in left knee: Secondary | ICD-10-CM | POA: Diagnosis not present

## 2021-05-21 DIAGNOSIS — M25562 Pain in left knee: Secondary | ICD-10-CM | POA: Diagnosis not present

## 2021-05-25 ENCOUNTER — Telehealth: Payer: Self-pay

## 2021-05-25 DIAGNOSIS — M25562 Pain in left knee: Secondary | ICD-10-CM | POA: Diagnosis not present

## 2021-05-25 NOTE — Telephone Encounter (Signed)
LVM for patient to call back to schedule sleep study.

## 2021-05-27 DIAGNOSIS — R300 Dysuria: Secondary | ICD-10-CM | POA: Diagnosis not present

## 2021-05-28 DIAGNOSIS — M545 Low back pain, unspecified: Secondary | ICD-10-CM | POA: Diagnosis not present

## 2021-05-28 DIAGNOSIS — M5451 Vertebrogenic low back pain: Secondary | ICD-10-CM | POA: Diagnosis not present

## 2021-05-28 DIAGNOSIS — M5126 Other intervertebral disc displacement, lumbar region: Secondary | ICD-10-CM | POA: Insufficient documentation

## 2021-06-08 DIAGNOSIS — K449 Diaphragmatic hernia without obstruction or gangrene: Secondary | ICD-10-CM | POA: Diagnosis not present

## 2021-06-08 DIAGNOSIS — K5730 Diverticulosis of large intestine without perforation or abscess without bleeding: Secondary | ICD-10-CM | POA: Diagnosis not present

## 2021-06-08 DIAGNOSIS — K219 Gastro-esophageal reflux disease without esophagitis: Secondary | ICD-10-CM | POA: Diagnosis not present

## 2021-06-08 DIAGNOSIS — Z1211 Encounter for screening for malignant neoplasm of colon: Secondary | ICD-10-CM | POA: Diagnosis not present

## 2021-06-08 DIAGNOSIS — K2100 Gastro-esophageal reflux disease with esophagitis, without bleeding: Secondary | ICD-10-CM | POA: Diagnosis not present

## 2021-06-09 ENCOUNTER — Ambulatory Visit: Payer: Medicare PPO | Admitting: Neurology

## 2021-06-09 DIAGNOSIS — G43711 Chronic migraine without aura, intractable, with status migrainosus: Secondary | ICD-10-CM

## 2021-06-09 NOTE — Progress Notes (Signed)
Interval history 06/09/2021: stable. No corrugators or procerus. Very high frontalis. Rest in traps. Broke her patella in a wheelchair now.>70% improvement in frequency of migraines, Baseline 15 severemigraine days a month and daily headaches.  Interval history 03/10/2021: stable. No corrugators or procerus. Very high frontalis. Rest in traps. Broke her patella in a wheelchair now.  Interval history 12/01/2020: stable  Send to Dr. Vickey Huger, intractable headches, morning headaches: Intractable headaches, wakes with headaches, has headaches when she wakes in the morning, hx of strokes: sleep evaluation  Imitrex injections: Patient has intractable chronic migraines, the best thing that is ever worked for her is Imitrex injections, we have tried multiple alternative medications including analgesics, Nurtec, Ubrelvy, Reyvow, and multiple other for second and third line acute management medications.  Patient had stroke, at that time we took her off her Imitrex, today she implore only request to have it back, she understands the risks, we did review the risks and discussed that sometimes life is about weighing the benefits versus those risks, at this time I will prescribe them for her, she is fully aware of the risk that she is taking with a history of vascular disease.  Interval history 02/12/2020: Migraines fluctuate with her eye problems, she sees cornea specialists and retina specialists and they may install long term steroid release long term in the eyes. Las botox she had only 4 migraine days a month taken care of by imitrex, significant.  >70% improvement in frequency of migraines, Baseline 15 severemigraine days a month and daily headaches. No clenching.  She absolutely loved  .Dry needling: cervical myofascial pain, Send back to dry needling - [address] for cervical myofascial pain syndrome. Gave samples for Nurtec for her to try as well acutely at last visit but she did not like it.  Tried: VPA,  Topamax, antidepressants, norvasc, phenergan, frova, aimovig, ajovy, fioricet, (save 6 As), verapamil     Consent Form Botulism Toxin Injection For Chronic Migraine    Reviewed orally with patient, additionally signature is on file:  Botulism toxin has been approved by the Federal drug administration for treatment of chronic migraine. Botulism toxin does not cure chronic migraine and it may not be effective in some patients.  The administration of botulism toxin is accomplished by injecting a small amount of toxin into the muscles of the neck and head. Dosage must be titrated for each individual. Any benefits resulting from botulism toxin tend to wear off after 3 months with a repeat injection required if benefit is to be maintained. Injections are usually done every 3-4 months with maximum effect peak achieved by about 2 or 3 weeks. Botulism toxin is expensive and you should be sure of what costs you will incur resulting from the injection.  The side effects of botulism toxin use for chronic migraine may include:   -Transient, and usually mild, facial weakness with facial injections  -Transient, and usually mild, head or neck weakness with head/neck injections  -Reduction or loss of forehead facial animation due to forehead muscle weakness  -Eyelid drooping  -Dry eye  -Pain at the site of injection or bruising at the site of injection  -Double vision  -Potential unknown long term risks  Contraindications: You should not have Botox if you are pregnant, nursing, allergic to albumin, have an infection, skin condition, or muscle weakness at the site of the injection, or have myasthenia gravis, Lambert-Eaton syndrome, or ALS.  It is also possible that as with any injection, there may be an  allergic reaction or no effect from the medication. Reduced effectiveness after repeated injections is sometimes seen and rarely infection at the injection site may occur. All care will be taken to prevent  these side effects. If therapy is given over a long time, atrophy and wasting in the muscle injected may occur. Occasionally the patient's become refractory to treatment because they develop antibodies to the toxin. In this event, therapy needs to be modified.  I have read the above information and consent to the administration of botulism toxin.    BOTOX PROCEDURE NOTE FOR MIGRAINE HEADACHE    Contraindications and precautions discussed with patient(above). Aseptic procedure was observed and patient tolerated procedure. Procedure performed by Dr. Artemio Aly  The condition has existed for more than 6 months, and pt does not have a diagnosis of ALS, Myasthenia Gravis or Lambert-Eaton Syndrome.  Risks and benefits of injections discussed and pt agrees to proceed with the procedure.  Written consent obtained  These injections are medically necessary. Pt  receives good benefits from these injections. These injections do not cause sedations or hallucinations which the oral therapies may cause.  Description of procedure:  The patient was placed in a sitting position. The standard protocol was used for Botox as follows, with 5 units of Botox injected at each site:   -Procerus muscle, midline injection DID NOT PERFORM  -Corrugator muscle, bilateral injection DID NOT PERFORM  -Frontalis muscle, bilateral injection, with 2 sites each side, medial injection was performed in the upper one third of the frontalis muscle, in the region vertical from the medial inferior edge of the superior orbital rim. The lateral injection was again in the upper one third of the forehead vertically above the lateral limbus of the cornea, 1.5 cm lateral to the medial injection site.  -Temporalis muscle injection, 4 sites, bilaterally. The first injection was 3 cm above the tragus of the ear, second injection site was 1.5 cm to 3 cm up from the first injection site in line with the tragus of the ear. The third injection  site was 1.5-3 cm forward between the first 2 injection sites. The fourth injection site was 1.5 cm posterior to the second injection site.   -Occipitalis muscle injection, 3 sites, bilaterally. The first injection was done one half way between the occipital protuberance and the tip of the mastoid process behind the ear. The second injection site was done lateral and superior to the first, 1 fingerbreadth from the first injection. The third injection site was 1 fingerbreadth superiorly and medially from the first injection site.  -Cervical paraspinal muscle injection, 2 sites, bilateral knee first injection site was 1 cm from the midline of the cervical spine, 3 cm inferior to the lower border of the occipital protuberance. The second injection site was 1.5 cm superiorly and laterally to the first injection site.  -Trapezius muscle injection was performed at 3 sites, bilaterally. The first injection site was in the upper trapezius muscle halfway between the inflection point of the neck, and the acromion. The second injection site was one half way between the acromion and the first injection site. The third injection was done between the first injection site and the inflection point of the neck.   Will return for repeat injection in 3 months.   140 units of Botox was used, 60U Botox not injected was wasted. The patient tolerated the procedure well, there were no complications of the above procedure.

## 2021-06-09 NOTE — Progress Notes (Signed)
Botox- 200 units x 1 vial Lot: [id number] Expiration: 11/2023 NDC: 9622-2979-89  Bacteriostatic 0.9% Sodium Chloride- 91mL total Lot: [id number] Expiration: 01/07-2022 NDC: 7408-1448-18  Dx: H63.149 B/B

## 2021-06-15 ENCOUNTER — Other Ambulatory Visit: Payer: Self-pay | Admitting: Cardiology

## 2021-06-15 DIAGNOSIS — M5451 Vertebrogenic low back pain: Secondary | ICD-10-CM | POA: Diagnosis not present

## 2021-06-22 DIAGNOSIS — S82002A Unspecified fracture of left patella, initial encounter for closed fracture: Secondary | ICD-10-CM | POA: Insufficient documentation

## 2021-07-01 DIAGNOSIS — S82002D Unspecified fracture of left patella, subsequent encounter for closed fracture with routine healing: Secondary | ICD-10-CM | POA: Diagnosis not present

## 2021-07-02 ENCOUNTER — Other Ambulatory Visit: Payer: Self-pay | Admitting: Neurology

## 2021-07-03 DIAGNOSIS — H353221 Exudative age-related macular degeneration, left eye, with active choroidal neovascularization: Secondary | ICD-10-CM | POA: Diagnosis not present

## 2021-07-03 DIAGNOSIS — H401112 Primary open-angle glaucoma, right eye, moderate stage: Secondary | ICD-10-CM | POA: Diagnosis not present

## 2021-07-03 DIAGNOSIS — H30102 Unspecified disseminated chorioretinal inflammation, left eye: Secondary | ICD-10-CM | POA: Diagnosis not present

## 2021-07-03 DIAGNOSIS — Z79899 Other long term (current) drug therapy: Secondary | ICD-10-CM | POA: Diagnosis not present

## 2021-07-03 DIAGNOSIS — H401123 Primary open-angle glaucoma, left eye, severe stage: Secondary | ICD-10-CM | POA: Diagnosis not present

## 2021-07-03 DIAGNOSIS — Z947 Corneal transplant status: Secondary | ICD-10-CM | POA: Diagnosis not present

## 2021-07-03 DIAGNOSIS — H30033 Focal chorioretinal inflammation, peripheral, bilateral: Secondary | ICD-10-CM | POA: Diagnosis not present

## 2021-07-03 DIAGNOSIS — H3581 Retinal edema: Secondary | ICD-10-CM | POA: Diagnosis not present

## 2021-07-07 DIAGNOSIS — Z87891 Personal history of nicotine dependence: Secondary | ICD-10-CM | POA: Diagnosis not present

## 2021-07-07 DIAGNOSIS — H938X2 Other specified disorders of left ear: Secondary | ICD-10-CM | POA: Diagnosis not present

## 2021-07-07 DIAGNOSIS — H30033 Focal chorioretinal inflammation, peripheral, bilateral: Secondary | ICD-10-CM | POA: Diagnosis not present

## 2021-07-07 DIAGNOSIS — H6121 Impacted cerumen, right ear: Secondary | ICD-10-CM | POA: Diagnosis not present

## 2021-07-07 DIAGNOSIS — Z79899 Other long term (current) drug therapy: Secondary | ICD-10-CM | POA: Diagnosis not present

## 2021-07-09 ENCOUNTER — Other Ambulatory Visit: Payer: Self-pay | Admitting: Cardiology

## 2021-07-09 DIAGNOSIS — H401123 Primary open-angle glaucoma, left eye, severe stage: Secondary | ICD-10-CM | POA: Diagnosis not present

## 2021-07-09 DIAGNOSIS — H401112 Primary open-angle glaucoma, right eye, moderate stage: Secondary | ICD-10-CM | POA: Diagnosis not present

## 2021-07-28 ENCOUNTER — Telehealth: Payer: Self-pay | Admitting: Neurology

## 2021-07-28 NOTE — Telephone Encounter (Signed)
Patient's next Botox appointment is 09/08/21. Received fax from [facility] regarding approval of Botox for the 2023 year. Patient's PA for Botox has been approved from 07/19/21-07/18/22.

## 2021-08-04 DIAGNOSIS — R946 Abnormal results of thyroid function studies: Secondary | ICD-10-CM | POA: Diagnosis not present

## 2021-08-04 DIAGNOSIS — E7800 Pure hypercholesterolemia, unspecified: Secondary | ICD-10-CM | POA: Diagnosis not present

## 2021-08-04 DIAGNOSIS — Z79899 Other long term (current) drug therapy: Secondary | ICD-10-CM | POA: Diagnosis not present

## 2021-08-04 DIAGNOSIS — R7301 Impaired fasting glucose: Secondary | ICD-10-CM | POA: Diagnosis not present

## 2021-08-04 DIAGNOSIS — I6930 Unspecified sequelae of cerebral infarction: Secondary | ICD-10-CM | POA: Diagnosis not present

## 2021-08-04 DIAGNOSIS — E559 Vitamin D deficiency, unspecified: Secondary | ICD-10-CM | POA: Diagnosis not present

## 2021-08-04 DIAGNOSIS — I779 Disorder of arteries and arterioles, unspecified: Secondary | ICD-10-CM | POA: Diagnosis not present

## 2021-08-04 DIAGNOSIS — Z0000 Encounter for general adult medical examination without abnormal findings: Secondary | ICD-10-CM | POA: Diagnosis not present

## 2021-08-05 ENCOUNTER — Other Ambulatory Visit: Payer: Self-pay | Admitting: Internal Medicine

## 2021-08-05 DIAGNOSIS — R059 Cough, unspecified: Secondary | ICD-10-CM

## 2021-08-11 DIAGNOSIS — M069 Rheumatoid arthritis, unspecified: Secondary | ICD-10-CM | POA: Diagnosis not present

## 2021-08-11 DIAGNOSIS — G40909 Epilepsy, unspecified, not intractable, without status epilepticus: Secondary | ICD-10-CM | POA: Diagnosis not present

## 2021-08-11 DIAGNOSIS — Z9889 Other specified postprocedural states: Secondary | ICD-10-CM | POA: Diagnosis not present

## 2021-08-11 DIAGNOSIS — H18609 Keratoconus, unspecified, unspecified eye: Secondary | ICD-10-CM | POA: Diagnosis not present

## 2021-08-11 DIAGNOSIS — G43909 Migraine, unspecified, not intractable, without status migrainosus: Secondary | ICD-10-CM | POA: Diagnosis not present

## 2021-08-11 DIAGNOSIS — I6930 Unspecified sequelae of cerebral infarction: Secondary | ICD-10-CM | POA: Diagnosis not present

## 2021-08-11 DIAGNOSIS — Z0000 Encounter for general adult medical examination without abnormal findings: Secondary | ICD-10-CM | POA: Diagnosis not present

## 2021-08-11 DIAGNOSIS — H3530 Unspecified macular degeneration: Secondary | ICD-10-CM | POA: Diagnosis not present

## 2021-08-11 DIAGNOSIS — I10 Essential (primary) hypertension: Secondary | ICD-10-CM | POA: Diagnosis not present

## 2021-08-13 DIAGNOSIS — H21511 Anterior synechiae (iris), right eye: Secondary | ICD-10-CM | POA: Diagnosis not present

## 2021-08-13 DIAGNOSIS — H401123 Primary open-angle glaucoma, left eye, severe stage: Secondary | ICD-10-CM | POA: Diagnosis not present

## 2021-08-13 DIAGNOSIS — Z886 Allergy status to analgesic agent status: Secondary | ICD-10-CM | POA: Diagnosis not present

## 2021-08-13 DIAGNOSIS — H401112 Primary open-angle glaucoma, right eye, moderate stage: Secondary | ICD-10-CM | POA: Diagnosis not present

## 2021-08-13 DIAGNOSIS — H30033 Focal chorioretinal inflammation, peripheral, bilateral: Secondary | ICD-10-CM | POA: Diagnosis not present

## 2021-08-13 DIAGNOSIS — Z947 Corneal transplant status: Secondary | ICD-10-CM | POA: Diagnosis not present

## 2021-08-13 DIAGNOSIS — H0288B Meibomian gland dysfunction left eye, upper and lower eyelids: Secondary | ICD-10-CM | POA: Diagnosis not present

## 2021-08-13 DIAGNOSIS — H0288A Meibomian gland dysfunction right eye, upper and lower eyelids: Secondary | ICD-10-CM | POA: Diagnosis not present

## 2021-08-13 DIAGNOSIS — H04123 Dry eye syndrome of bilateral lacrimal glands: Secondary | ICD-10-CM | POA: Diagnosis not present

## 2021-08-13 DIAGNOSIS — Z961 Presence of intraocular lens: Secondary | ICD-10-CM | POA: Diagnosis not present

## 2021-08-13 DIAGNOSIS — Z885 Allergy status to narcotic agent status: Secondary | ICD-10-CM | POA: Diagnosis not present

## 2021-08-13 DIAGNOSIS — H353221 Exudative age-related macular degeneration, left eye, with active choroidal neovascularization: Secondary | ICD-10-CM | POA: Diagnosis not present

## 2021-08-20 IMAGING — RF DG KNEE 1-2V*L*
1 series · 2 of 2 positions shown · non-contrast
Comparison: 02/11/2021.

CLINICAL DATA: ORIF.

EXAM:
LEFT KNEE - 1-2 VIEW; DG C-ARM 1-60 MIN

[Series 1: run · 2 of 2 slices shown]
[im 1/2]
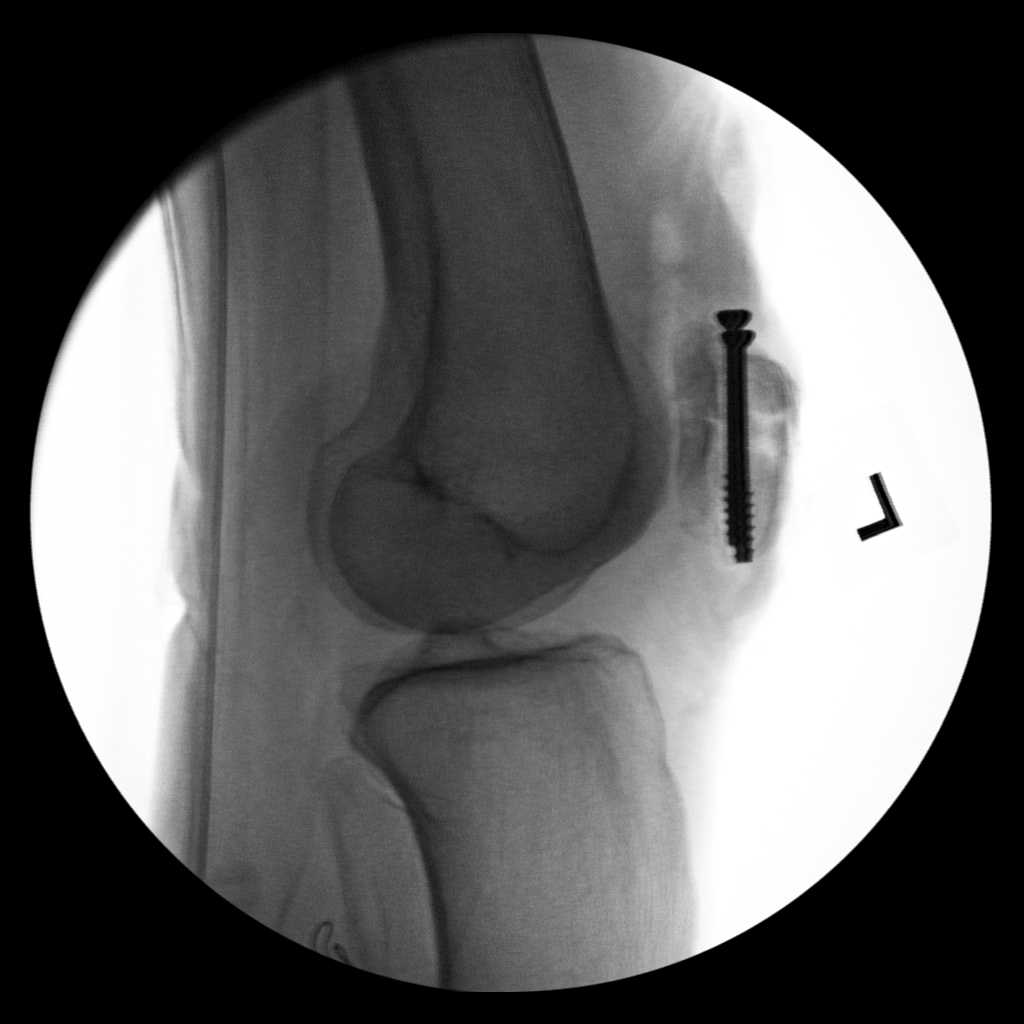
[im 2/2]
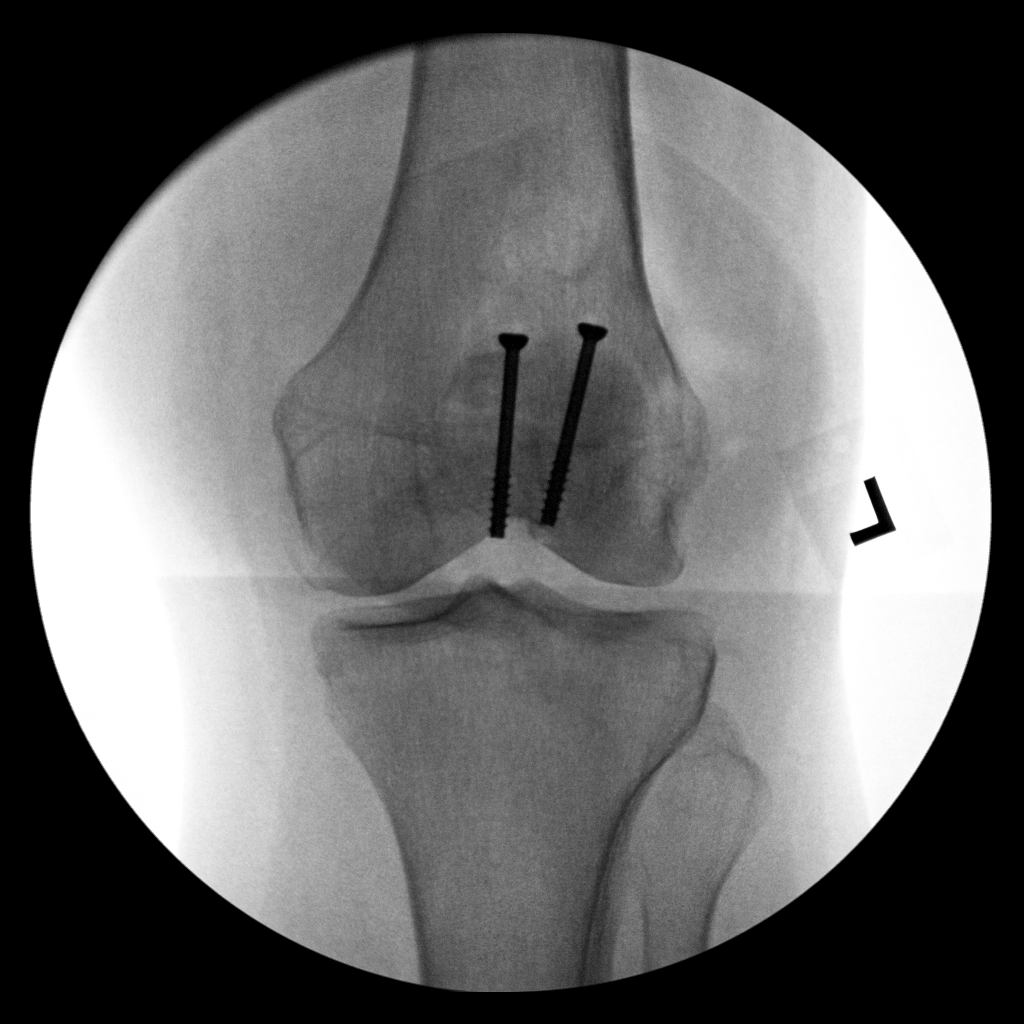

[2 of 2 positions shown; findings below may reference images not displayed]

FINDINGS: ORIF left patella. Hardware intact. Anatomic alignment. Two images
obtained. 0 minutes 24 seconds fluoroscopy time. 0.4 mGy radiation
dose.
IMPRESSION: ORIF left patella with anatomic alignment.

## 2021-08-27 ENCOUNTER — Other Ambulatory Visit: Payer: Self-pay

## 2021-08-27 DIAGNOSIS — I6522 Occlusion and stenosis of left carotid artery: Secondary | ICD-10-CM

## 2021-08-31 NOTE — Progress Notes (Signed)
HISTORY AND PHYSICAL     CC:  follow up. Requesting Provider:  Irena Reichmann, DO  HPI: This is a 71 y.o. female here for follow up for carotid artery stenosis.  Pt is s/p left CEA  on 11/15/2007 by Dr. Madilyn Fireman.  She has hx of TIA previous to CEA with sx of expressive aphasia, right hemiparesis, HA and transient left monocular vision loss.    Pt was last seen 08/28/2020 and at that time she was not having any stroke sx.  She did have hx of left eye surgery with hx of dilated pupil that is being followed by her ophthalmologist. Her duplex revealed 1-39% bilateral ICA stenosis.    Pt returns today for follow up.    Pt denies any amaurosis fugax, speech difficulties, weakness, numbness, paralysis or clumsiness or facial droop.    She states that she does have some issues with GERD/acid reflux.  She does have a GI doctor and recently had an endoscopy and was told everything was okay.  She continues to have issues.  She has not followed up with her cardiologist or PCP about this.  She did lose weight after her knee surgery last year but has gained back 6lbs.    The pt is on a statin for cholesterol management.  The pt is on a daily aspirin.   Other AC:  none The pt is on CCB, ARB, diuretic for hypertension.   The pt is not diabetic.   Tobacco hx:  former  Her mother had hx of AAA and she tested for AAA with u/s and it measured 2.7cm in January 2020.    Past Medical History:  Diagnosis Date   Arthritis    in the neck   Carotid artery occlusion    Epilepsy (HCC)    GERD (gastroesophageal reflux disease)    Glaucoma    Hyperlipidemia    Hypertension    Keratoconus of both eyes 1981   Macular degeneration    Migraines    Peripheral vascular disease (HCC)    carotid occlusion surgery on left   Raynaud's disease    Retinal edema    Rheumatoid arthritis(714.0)    Stroke (HCC)    two mild strokes presumed from left carotid stenosis    Past Surgical History:  Procedure Laterality Date    CAROTID ARTERY ANGIOPLASTY  2008   Dr Madilyn Fireman   CAROTID ENDARTERECTOMY Left 11-15-2007   cataract extraction Right 04-2014   [facility]   CHOLECYSTECTOMY  1995   CORNEAL TRANSPLANT     x 5 ; steroid inj. 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   [facility]   CORNEAL TRANSPLANT Left 04/17/2019   EYE SURGERY Right 12/2016   cornea repair    EYE SURGERY Right 09/20/2017   cornea transplant    GLAUCOMA SURGERY Right 2018   ORIF PATELLA Left 02/12/2021   Procedure: OPEN REDUCTION INTERNAL (ORIF) FIXATION PATELLA;  Surgeon: Yolonda Kida, MD;  Location: [facility] OR;  Service: Orthopedics;  Laterality: Left;   TRIGGER FINGER RELEASE Right 05/16/2018   Procedure: RELEASE TRIGGER FINGER/A-1 PULLEY RIGHT THUMB;  Surgeon: Cindee Salt, MD;  Location: [facility];  Service: Orthopedics;  Laterality: Right;    Allergies  Allergen Reactions   Morphine Other (See Comments)    Difficulty breathing   Morphine And Related Shortness Of Breath   Alphagan [Brimonidine]     Eyelid swelling, scratching Allergic to preservative contained in this med   Codeine Nausea  And Vomiting   Dorzolamide Hcl-Timolol Mal     Inflammation of the eyelid, scratching Allergic to preservative contained in this med   Hydrocodone-Acetaminophen Nausea And Vomiting    But tolerates tylenol   Sulfa Antibiotics Rash    "Large bumps"   Sulfamethoxazole Rash    Current Outpatient Medications  Medication Sig Dispense Refill   amLODipine (NORVASC) 10 MG tablet Take 1 tablet (10 mg total) by mouth daily. 90 tablet 3   ascorbic Acid (VITAMIN C) 500 MG CPCR Take 500 mg by mouth daily.     aspirin 325 MG tablet Take 325 mg by mouth daily.     botulinum toxin Type A (BOTOX) 100 units SOLR injection Inject IM into the head and neck muscles every 3 months by provider in office 2 vial 3   butalbital-acetaminophen-caffeine (FIORICET, ESGIC) 50-325-40 MG tablet Take 1 tablet by mouth every 6 (six)  hours as needed for headache. 10 tablet 4   Calcium Carbonate-Vit D-Min (CALCIUM 1200 PO) Take 2 capsules by mouth daily.     chlorthalidone (HYGROTON) 25 MG tablet TAKE 1 TABLET(25 MG) BY MOUTH DAILY 30 tablet 0   Cholecalciferol (VITAMIN D PO) Take 1,000 Units by mouth daily.      Difluprednate (DUREZOL) 0.05 % EMUL 5 drops in left eye and 2 in right eye daily.     DILANTIN 100 MG ER capsule TAKE 3 CAPSULES BY MOUTH DAILY 270 capsule 3   docusate sodium (COLACE) 100 MG capsule Take 1 capsule (100 mg total) by mouth 2 (two) times daily. 10 capsule 0   Erenumab-aooe (AIMOVIG) 140 MG/ML SOAJ Inject 140 mg into the skin every 30 (thirty) days. 6 mL 0   Evolocumab (REPATHA SURECLICK) 140 MG/ML SOAJ Inject 1 pen into the skin every 14 (fourteen) days. 2 mL 11   ezetimibe (ZETIA) 10 MG tablet Take 1 tablet (10 mg total) by mouth daily. 90 tablet 3   famotidine (PEPCID) 20 MG tablet TAKE 1 TABLET(20 MG) BY MOUTH AT BEDTIME 90 tablet 1   HUMIRA PEN 40 MG/0.4ML PNKT Inject 40 mg into the skin every 14 (fourteen) days. As directed     leflunomide (ARAVA) 20 MG tablet Take 20 mg by mouth daily.     LORazepam (ATIVAN) 0.5 MG tablet Take 0.5 mg by mouth daily as needed for anxiety or sleep.     losartan (COZAAR) 100 MG tablet Take 1 tablet (100 mg total) by mouth daily. 90 tablet 3   pantoprazole (PROTONIX) 40 MG tablet TAKE 1 TABLET(40 MG) BY MOUTH DAILY 90 tablet 3   polyethylene glycol (MIRALAX / GLYCOLAX) 17 g packet Take 17 g by mouth daily. 14 each 0   promethazine (PHENERGAN) 25 MG tablet Take 1 tablet (25 mg total) by mouth every 6 (six) hours as needed for nausea or vomiting. 30 tablet 11   RESTASIS 0.05 % ophthalmic emulsion 2 (two) times daily.     rosuvastatin (CRESTOR) 20 MG tablet Take 1 tablet (20 mg total) by mouth daily. 90 tablet 3   spironolactone (ALDACTONE) 25 MG tablet Take 0.5 tablets (12.5 mg total) by mouth daily. 45 tablet 3   SUMAtriptan (IMITREX) 6 MG/0.5ML SOLN injection Inject  0.5 mLs (6 mg total) into the skin every 2 (two) hours as needed for migraine or headache. Take one dose at headache onset, can take additional dose 2hrs later if needed. No more then 2 injections in 24hrs 6 mL 11   tacrolimus (PROGRAF) 1 MG capsule Take  1 mg by mouth 2 (two) times daily.     Current Facility-Administered Medications  Medication Dose Route Frequency Provider Last Rate Last Admin   0.9 %  sodium chloride infusion   Intravenous PRN Janetta Hora, PA-C        Family History  Problem Relation Age of Onset   Heart disease Mother    Lung disease Mother        ? disease process   Uterine cancer Mother    Heart disease Father    Clotting disorder Father    Collagen disease Father    Cervical cancer Maternal Aunt    Prostate cancer Maternal Grandfather    Rheum arthritis Sister    High Cholesterol Sister    Epilepsy Sister    Rheum arthritis Maternal Grandmother    High Cholesterol Brother    High blood pressure Brother    High blood pressure Brother    High Cholesterol Brother    High blood pressure Brother    High Cholesterol Brother     Social History   Socioeconomic History   Marital status: Married    Spouse name: [name]   Number of children: 2   Years of education: Bachelor   Highest education level: Bachelor's degree (e.g., BA, AB, BS)  Occupational History   Occupation: Runner, broadcasting/film/video   Occupation: AG TEACHER    Employer: [employer]    Comment: retired  Tobacco Use   Smoking status: Former    Packs/day: 0.50    Years: 14.00    Pack years: 7.00    Types: Cigarettes    Quit date: 1989    Years since quitting: 34.1   Smokeless tobacco: Never  Vaping Use   Vaping Use: Never used  Substance and Sexual Activity   Alcohol use: No   Drug use: No   Sexual activity: Not on file  Other Topics Concern   Not on file  Social History Narrative   Patient is married to [place]), has 2 children   Patient is right handed   Education level is  Energy manager degree   Caffeine consumption is about 2 cups daily   Lives at home with husband    Social Determinants of Corporate investment banker Strain: Not on file  Food Insecurity: Not on file  Transportation Needs: Not on file  Physical Activity: Not on file  Stress: Not on file  Social Connections: Not on file  Intimate Partner Violence: Not on file     REVIEW OF SYSTEMS:    denotes positive finding,  denotes negative finding Cardiac  Comments:  Chest pain or chest pressure:    Shortness of breath upon exertion: x   Short of breath when lying flat:    Irregular heart rhythm:        Vascular    Pain in calf, thigh, or hip brought on by ambulation:    Pain in feet at night that wakes you up from your sleep:     Blood clot in your veins:    Leg swelling:         Pulmonary    Oxygen at home:    Productive cough:  x   Wheezing:  x       Neurologic    Sudden weakness in arms or legs:     Sudden numbness in arms or legs:     Sudden onset of difficulty speaking or slurred speech:    Temporary loss of vision in one  eye:     Problems with dizziness:         Gastrointestinal    Blood in stool:     Vomited blood:         Genitourinary    Burning when urinating:     Blood in urine:        Psychiatric    Major depression:         Hematologic    Bleeding problems:    Problems with blood clotting too easily:        Skin    Rashes or ulcers:        Constitutional    Fever or chills:      PHYSICAL EXAMINATION:  Today's Vitals   09/02/21 1327 09/02/21 1330  BP: (!) 142/83 (!) 158/88  Pulse: 79 83  Temp: (!) 97.4 F (36.3 C)   Weight: 113 lb 6.4 oz (51.4 kg)   Height: 5\' 2"  (1.575 m)    Body mass index is 20.74 kg/m.   General:  WDWN in NAD; vital signs documented above Gait: Not observed HENT: WNL, normocephalic Pulmonary: normal non-labored breathing Cardiac: regular HR, without carotid bruits Abdomen: soft, NT; aortic pulse is not  palpable Skin: without rashes Vascular Exam/Pulses: Bilateral radial pulses are palpable; right PT and left AT are palpable Extremities: without ischemic changes, without Gangrene , without cellulitis; without open wounds Musculoskeletal: no muscle wasting or atrophy  Neurologic: A&O X 3; moving all extremities equally; speech is fluent/normal Psychiatric:  The pt has Normal affect.   Non-Invasive Vascular Imaging:   Carotid Duplex on 09/02/2021: Right:  1-39% ICA stenosis Left:  1-39% ICA stenosis Vertebrals:  Bilateral vertebral arteries demonstrate antegrade flow.  Subclavians: Normal flow hemodynamics were seen in bilateral subclavian arteries.  Previous Carotid duplex on 08/28/2020: Right: 1-39% ICA stenosis Left:   1-39% ICA stenosis    ASSESSMENT/PLAN:: 71 y.o. female here for follow up carotid artery stenosis and is s/p left CEA for symptomatic carotid artery stenosis on 11/15/2007 by Dr. Madilyn FiremanHayes.   -duplex today reveals 1-39% bilateral ICA stenosis and she remains asymptomatic. -discussed s/s of stroke with pt and she understands should she develop any of these sx, she will go to the nearest ER or call 911. -pt will f/u in one year with carotid duplex -pt will call sooner should they have any issues. -continue statin/asa  -regarding her GERD/acid reflux-she just had an endoscopy and was cleared.  She continues to have pain that she contributes to GERD.  She sees Dr. Anne FuSkains.  Given women can have different s/s of cardiac issues, discussed with her f/u with him to be evaluated.  If that work up is negative, recommend following up with her PCP.    Doreatha MassedSamantha Kasin Tonkinson, [facility] [phone]  Clinic MD:  Randie Heinzain

## 2021-09-02 ENCOUNTER — Ambulatory Visit (HOSPITAL_COMMUNITY)
Admission: RE | Admit: 2021-09-02 | Discharge: 2021-09-02 | Disposition: A | Discharge status: Home / Self Care | Payer: Medicare PPO | Source: Ambulatory Visit | Attending: Vascular Surgery | Admitting: Vascular Surgery

## 2021-09-02 ENCOUNTER — Encounter: Payer: Self-pay | Admitting: Physician Assistant

## 2021-09-02 ENCOUNTER — Ambulatory Visit: Payer: Medicare PPO | Admitting: Physician Assistant

## 2021-09-02 ENCOUNTER — Other Ambulatory Visit: Payer: Self-pay

## 2021-09-02 VITALS — BP 158/88 | HR 83 | Temp 97.4°F | Ht 62.0 in | Wt 113.4 lb

## 2021-09-02 DIAGNOSIS — I6522 Occlusion and stenosis of left carotid artery: Secondary | ICD-10-CM | POA: Diagnosis not present

## 2021-09-02 DIAGNOSIS — I6523 Occlusion and stenosis of bilateral carotid arteries: Secondary | ICD-10-CM | POA: Diagnosis not present

## 2021-09-06 ENCOUNTER — Other Ambulatory Visit: Payer: Self-pay | Admitting: Cardiology

## 2021-09-06 DIAGNOSIS — I10 Essential (primary) hypertension: Secondary | ICD-10-CM

## 2021-09-08 ENCOUNTER — Ambulatory Visit: Payer: Medicare PPO | Admitting: Neurology

## 2021-09-08 DIAGNOSIS — G43719 Chronic migraine without aura, intractable, without status migrainosus: Secondary | ICD-10-CM

## 2021-09-08 DIAGNOSIS — G43711 Chronic migraine without aura, intractable, with status migrainosus: Secondary | ICD-10-CM | POA: Diagnosis not present

## 2021-09-08 MED ORDER — SUMATRIPTAN SUCCINATE 6 MG/0.5ML ~~LOC~~ SOLN: 6.0000 mg | SUBCUTANEOUS | 11 refills | Status: DC | PRN

## 2021-09-08 NOTE — Progress Notes (Signed)
Botox- 200 units x 1 vial Lot: [id number] Expiration: 10/2023 NDC: [id number]  0.9% Sodium Chloride- 47mL total Lot: [id number] Expiration: 08/19/2022 NDC: [id number]  Dx: [id number] B/B

## 2021-09-08 NOTE — Progress Notes (Signed)
09/08/2021: stable  Interval history 06/09/2021: stable. No corrugators or procerus. Very high frontalis.  Broke her patella in a wheelchair now.>70% improvement in frequency of migraines, Baseline 15 severemigraine days a month and daily headaches.  Interval history 03/10/2021: stable. No corrugators or procerus. Very high frontalis. Rest in traps. Broke her patella in a wheelchair now.  Interval history 12/01/2020: stable  Send to Dr. Vickey Huger, intractable headches, morning headaches: Intractable headaches, wakes with headaches, has headaches when she wakes in the morning, hx of strokes: sleep evaluation  Imitrex injections: Patient has intractable chronic migraines, the best thing that is ever worked for her is Imitrex injections, we have tried multiple alternative medications including analgesics, Nurtec, Ubrelvy, Reyvow, and multiple other for second and third line acute management medications.  Patient had stroke, at that time we took her off her Imitrex, today she implore only request to have it back, she understands the risks, we did review the risks and discussed that sometimes life is about weighing the benefits versus those risks, at this time I will prescribe them for her, she is fully aware of the risk that she is taking with a history of vascular disease.  Interval history 02/12/2020: Migraines fluctuate with her eye problems, she sees cornea specialists and retina specialists and they may install long term steroid release long term in the eyes. Las botox she had only 4 migraine days a month taken care of by imitrex, significant.  >70% improvement in frequency of migraines, Baseline 15 severemigraine days a month and daily headaches. No clenching.  She absolutely loved  .Dry needling: cervical myofascial pain, Send back to dry needling - [address] for cervical myofascial pain syndrome. Gave samples for Nurtec for her to try as well acutely at last visit but she did not like it.  Tried:  VPA, Topamax, antidepressants, norvasc, phenergan, frova, aimovig, ajovy, fioricet, (save 6 As), verapamil     Consent Form Botulism Toxin Injection For Chronic Migraine    Reviewed orally with patient, additionally signature is on file:  Botulism toxin has been approved by the Federal drug administration for treatment of chronic migraine. Botulism toxin does not cure chronic migraine and it may not be effective in some patients.  The administration of botulism toxin is accomplished by injecting a small amount of toxin into the muscles of the neck and head. Dosage must be titrated for each individual. Any benefits resulting from botulism toxin tend to wear off after 3 months with a repeat injection required if benefit is to be maintained. Injections are usually done every 3-4 months with maximum effect peak achieved by about 2 or 3 weeks. Botulism toxin is expensive and you should be sure of what costs you will incur resulting from the injection.  The side effects of botulism toxin use for chronic migraine may include:   -Transient, and usually mild, facial weakness with facial injections  -Transient, and usually mild, head or neck weakness with head/neck injections  -Reduction or loss of forehead facial animation due to forehead muscle weakness  -Eyelid drooping  -Dry eye  -Pain at the site of injection or bruising at the site of injection  -Double vision  -Potential unknown long term risks  Contraindications: You should not have Botox if you are pregnant, nursing, allergic to albumin, have an infection, skin condition, or muscle weakness at the site of the injection, or have myasthenia gravis, Lambert-Eaton syndrome, or ALS.  It is also possible that as with any injection, there may be  an allergic reaction or no effect from the medication. Reduced effectiveness after repeated injections is sometimes seen and rarely infection at the injection site may occur. All care will be taken to  prevent these side effects. If therapy is given over a long time, atrophy and wasting in the muscle injected may occur. Occasionally the patient's become refractory to treatment because they develop antibodies to the toxin. In this event, therapy needs to be modified.  I have read the above information and consent to the administration of botulism toxin.    BOTOX PROCEDURE NOTE FOR MIGRAINE HEADACHE    Contraindications and precautions discussed with patient(above). Aseptic procedure was observed and patient tolerated procedure. Procedure performed by Dr. Artemio Aly  The condition has existed for more than 6 months, and pt does not have a diagnosis of ALS, Myasthenia Gravis or Lambert-Eaton Syndrome.  Risks and benefits of injections discussed and pt agrees to proceed with the procedure.  Written consent obtained  These injections are medically necessary. Pt  receives good benefits from these injections. These injections do not cause sedations or hallucinations which the oral therapies may cause.  Description of procedure:  The patient was placed in a sitting position. The standard protocol was used for Botox as follows, with 5 units of Botox injected at each site:   -Procerus muscle, midline injection DID NOT PERFORM  -Corrugator muscle, bilateral injection DID NOT PERFORM  -Frontalis muscle, bilateral injection, with 2 sites each side, medial injection was performed in the upper one third of the frontalis muscle, in the region vertical from the medial inferior edge of the superior orbital rim. The lateral injection was again in the upper one third of the forehead vertically above the lateral limbus of the cornea, 1.5 cm lateral to the medial injection site.  -Temporalis muscle injection, 4 sites, bilaterally. The first injection was 3 cm above the tragus of the ear, second injection site was 1.5 cm to 3 cm up from the first injection site in line with the tragus of the ear. The third  injection site was 1.5-3 cm forward between the first 2 injection sites. The fourth injection site was 1.5 cm posterior to the second injection site.   -Occipitalis muscle injection, 3 sites, bilaterally. The first injection was done one half way between the occipital protuberance and the tip of the mastoid process behind the ear. The second injection site was done lateral and superior to the first, 1 fingerbreadth from the first injection. The third injection site was 1 fingerbreadth superiorly and medially from the first injection site.  -Cervical paraspinal muscle injection, 2 sites, bilateral knee first injection site was 1 cm from the midline of the cervical spine, 3 cm inferior to the lower border of the occipital protuberance. The second injection site was 1.5 cm superiorly and laterally to the first injection site.  -Trapezius muscle injection was performed at 3 sites, bilaterally. The first injection site was in the upper trapezius muscle halfway between the inflection point of the neck, and the acromion. The second injection site was one half way between the acromion and the first injection site. The third injection was done between the first injection site and the inflection point of the neck.   Will return for repeat injection in 3 months.   140 units of Botox was used, 60U Botox not injected was wasted. The patient tolerated the procedure well, there were no complications of the above procedure.

## 2021-09-14 ENCOUNTER — Other Ambulatory Visit: Payer: Self-pay | Admitting: Cardiology

## 2021-09-14 MED ORDER — CHLORTHALIDONE 25 MG PO TABS: 25.0000 mg | ORAL_TABLET | Freq: Every day | ORAL | 0 refills | Status: DC

## 2021-09-22 ENCOUNTER — Other Ambulatory Visit: Payer: Self-pay

## 2021-09-22 ENCOUNTER — Ambulatory Visit (INDEPENDENT_AMBULATORY_CARE_PROVIDER_SITE_OTHER): Payer: Medicare PPO

## 2021-09-22 ENCOUNTER — Ambulatory Visit: Payer: Medicare PPO | Admitting: Internal Medicine

## 2021-09-22 ENCOUNTER — Encounter: Payer: Self-pay | Admitting: Internal Medicine

## 2021-09-22 DIAGNOSIS — R059 Cough, unspecified: Secondary | ICD-10-CM | POA: Diagnosis not present

## 2021-09-22 DIAGNOSIS — J8410 Pulmonary fibrosis, unspecified: Secondary | ICD-10-CM

## 2021-09-22 DIAGNOSIS — J310 Chronic rhinitis: Secondary | ICD-10-CM

## 2021-09-22 NOTE — Progress Notes (Signed)
Subjective:     Patient ID: Brianna [name], female   DOB: 07-Oct-1950,     MRN: [id number]       Brief patient profile:  71 yowf  quit smoking age 71 with no resp problems then @  71 bad pneumonia.  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   No sign doe but not that active.   Sleeping ok without nocturnal  or early am exac of resp c/o's or need for noct saba.  rec Ok to restart boniva the first of September and every month but if your respiratory or reflux symptoms worsen it's first medication I would stop and take in it's place Reclast IV yearly.    11/13/2013 f/u ov/Brianna [name] re:  PF assoc with RA Chief Complaint  Patient presents with   Followup with PFT    Pt states that her breathing is unchanged since her last visit. No new co's today.    can now do 1st to 2nd floor s sob and working out regularly s limits due to sob rec  Return in one year for pfts and cxr and call sooner if not decline in tolerance > did not return       04/22/2017  [name]  / re-establish re RA assoc ILD  Chief Complaint  Patient presents with   Pulmonary Consult    Self referral for cough x 3-4 months. She is coughing up white to yellow sputum.    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  rec Augmentin 875 mg take one pill twice daily  X 10 days - take at breakfast and supper with large glass of water.  It would help reduce the usual side effects (diarrhea and yeast infections) if you ate cultured yogurt at lunch.  Prednisone 10 mg take  4 each am x 2 days,   2 each am x 2 days,  1 each am x 2 days and stop  Stop fosfamax for now  Pantoprazole (protonix) 40 mg   Take  30-60 min before first meal of the day and Pepcid (famotidine)  20 mg one @  bedtime until return to office - this is the best way to tell whether stomach acid is contributing to your problem.   GERD diet     06/14/2017  f/u ov/Brianna [name] re:  uacs ? Aggravated by fosfamax  Chief Complaint  Patient presents with   Follow-up    PFT done today. Her cough is much improved. No new co's. She wants to discuss alternative med for bones since we stopped the fosamax.  cough still present somedays p  stirring in am / no pm cough at all which is quite an improvement s need for cough suppression Rates herself at 90% better  rec Restart fosmax 2 weeks before visit (= 2 doses ) prior to your visit with your rheumatologist Risk of reflux goes up on fosfamax so ideally I would recommend a substitute either Reclast or Prolia if feasible but defer the final call on this issue to your rheumatologist     06/21/2018  f/u ov/Brianna [name] re:  ? RA lung dz  Chief Complaint  Patient presents with   Follow-up    PFT's done. Cough has resolved. No new co's.   Dyspnea:  Not limited by breathing from desired activities   Cough: none Sleeping: 20 degrees helps with nasal congestion SABA use: none 02: none  Systemic RA control good x has retinal involvement > f/u [facility]  rec Regular physical activity with monitoring of 02 saturations toward the end of exercise (nl values lower 90's) and call if trending down    06/22/2019  f/u ov/Brianna [name] re: ? RA lung dz  Chief Complaint   Patient presents with   Follow-up    PFT done today. Increased dry cough in the am.    Dyspnea:  Not limited by breathing from desired activities  But not doing aerobics due to gym Cough: throat clearing assoc with nasal congestion x 3 m/ has not tried otcs Sleeping: 20 degrees HOB up  SABA use: none  02: none  rec Make sure you check your oxygen saturations at highest level of activity  Try zyrtec 10 mg at bedtime  (24 hour pill) to helps nasal symptoms if not consider ENT evaluation> did not use as risk of dry eyes Add:  Cough flared off ppi ac so add back if not better on zyrtec    06/23/2020  f/u ov/Brianna [name] re: ?  RA lung dz  Chief Complaint  Patient presents with   Follow-up    Pulm fibrosis, Cough, still having dry cough, occ yellow mucus.Chest tightness with exertion  Dyspnea:  Walking the dog at brisk pace /  predictable R anterior cp when walks briskly up incline  Cough: throat clearing better in afternoon/ min yellow mucus/  Sleeping: 20-30 degrees  SABA use: none 02: none  rec GERD  Diet  Try allegra or clariton over  the counter to see if helps your sensation of post nasal drainage  Make sure you check your oxygen saturations at highest level of activity   Please remember to go to the lab department /ct sinus   - Allergy profile 06/23/2020 >  Eos 0.5/  IgE  59 - Sinus CT 07/04/2020 >>> Clear paranasal sinuses. Mild proximal narrowing of the bilateral ostiomeatal units secondary to mucosal thickening         09/30/2020  f/u ov/Brianna [name] re: pnds not able to take any antihistamines  Chief Complaint  Patient presents with   Follow-up    Cough is unchanged since there last visit. She has prod cough with yellow to clear sputum in the mornings.   Dyspnea: walking dog brisk pace/ up hills no more cps / no desats when checks but this is usually p ex  Cough: worse in am's x 3 years / can't take any inhistamines / assoc with pnds Sleeping: 30 degrees electric bed   comfortable SABA use: none 02: none  Covid status:   vax x 3  Rec Flonase twice daily each nostril  If not better :  I emphasized  that nasal steroids(flonase)  have no immediate benefit in terms of improving symptoms If still not better > see you ENT next and let him know about your CT sinus Make sure you check your oxygen saturation at your highest level of activity to be sure it stays over 90%    09/22/2021  f/u ov/Brianna [name] re: PF / RA   maint on no resp rx   No chief complaint on file.  Dyspnea:  not walking at much due to L knee  Cough: assoc with pnds / Sleeping: electric bed x 30 degrees  SABA use: none  02: none    No obvious day to day or daytime variability or assoc excess/ purulent sputum or mucus plugs or hemoptysis or cp or chest tightness, subjective wheeze or overt sinus or hb symptoms.   Sleeping  without nocturnal  or early am exacerbation  of respiratory  c/o's or need for noct saba. Also denies any obvious fluctuation of symptoms with weather or environmental changes or other aggravating or alleviating factors except as outlined above   No unusual exposure hx or h/o childhood pna/ asthma or knowledge of premature birth.  Current Allergies, Complete Past Medical History, Past Surgical History, Family History, and Social History were reviewed in [employer] record.  ROS  The following are not active complaints unless bolded Hoarseness, sore throat, dysphagia, dental problems, itching, sneezing,  nasal congestion or discharge of excess mucus or purulent secretions, ear ache,   fever, chills, sweats, unintended wt loss or wt gain, classically pleuritic or exertional cp,  orthopnea pnd or arm/hand swelling  or leg swelling, presyncope, palpitations, abdominal pain, anorexia, nausea, vomiting, diarrhea  or change in bowel habits or change in bladder habits, change in stools or change in urine, dysuria, hematuria,  rash, arthralgias, visual complaints,  headache, numbness, weakness or ataxia or problems with walking or coordination,  change in mood or  memory.        Current Meds  Medication Sig   amLODipine (NORVASC) 10 MG tablet Take 1 tablet (10 mg total) by mouth daily.   ascorbic Acid (VITAMIN C) 500 MG CPCR Take 500 mg by mouth daily.   aspirin 325 MG tablet Take 325 mg by mouth daily.   botulinum toxin Type A (BOTOX) 100 units SOLR injection Inject IM into the head and neck muscles every 3 months by provider in office   butalbital-acetaminophen-caffeine (FIORICET, ESGIC) 50-325-40 MG tablet Take 1 tablet by mouth every 6 (six) hours as needed for headache.   Calcium Carbonate-Vit D-Min (CALCIUM 1200 PO) Take 2 capsules by mouth daily.   chlorthalidone (HYGROTON) 25 MG tablet Take 1 tablet (25 mg total) by mouth daily. Please make overdue appt with Dr. Marlou Porch before anymore refills. Thank you 2nd attempt   Cholecalciferol (VITAMIN D PO) Take 1,000 Units by mouth daily.    DILANTIN 100 MG ER capsule TAKE 3 CAPSULES BY MOUTH DAILY   docusate sodium (COLACE) 100 MG capsule Take 1 capsule (100 mg total) by mouth 2 (two) times daily.   Erenumab-aooe (AIMOVIG) 140 MG/ML SOAJ Inject 140 mg into the skin every 30 (thirty) days.   Evolocumab (REPATHA SURECLICK) XX123456 MG/ML SOAJ Inject 1 pen into the skin every 14 (fourteen) days.   ezetimibe (ZETIA) 10 MG tablet Take 1 tablet (10 mg total) by mouth daily.   famotidine (PEPCID) 20 MG tablet TAKE 1 TABLET(20 MG) BY MOUTH AT BEDTIME   HUMIRA PEN 40 MG/0.4ML PNKT Inject 40 mg into  the skin every 14 (fourteen) days. As directed   leflunomide (ARAVA) 20 MG tablet Take 20 mg by mouth daily.   LORazepam (ATIVAN) 0.5 MG tablet Take 0.5 mg by mouth daily as needed for anxiety or sleep.   losartan (COZAAR) 100 MG tablet Take 1 tablet (100 mg total) by mouth daily.   pantoprazole (PROTONIX) 40 MG tablet TAKE 1 TABLET(40 MG) BY MOUTH DAILY   promethazine (PHENERGAN) 25 MG tablet Take 1 tablet (25 mg total) by  mouth every 6 (six) hours as needed for nausea or vomiting.   RESTASIS 0.05 % ophthalmic emulsion 2 (two) times daily.   rosuvastatin (CRESTOR) 20 MG tablet Take 1 tablet (20 mg total) by mouth daily. Please make overdue appt with Dr. Marlou Porch before anymore refills. Thank you 1st attempt   spironolactone (ALDACTONE) 25 MG tablet Take 0.5 tablets (12.5 mg total) by mouth daily.   SUMAtriptan (IMITREX) 6 MG/0.5ML SOLN injection Inject 0.5 mLs (6 mg total) into the skin every 2 (two) hours as needed for migraine or headache. Take one dose at headache onset, can take additional dose 2hrs later if needed. No more then 2 injections in 24hrs   tacrolimus (PROGRAF) 1 MG capsule Take 1 mg by mouth 2 (two) times daily.   Current Facility-Administered Medications for the 09/22/21 encounter (Office Visit) with Tanda Rockers, MD  Medication   0.9 %  sodium chloride infusion                    Objective:   Physical Exam   wts   09/22/2021      113  09/30/2020    118 06/23/2020    122  06/22/2019  128  06/21/2018    126   Wt 130 01/14/2011 > 08/27/2013  126 >  11/13/2013 125 > 04/22/2017  125 > 06/14/2017   127 > 10/11/2017  124 >   Vital signs reviewed  09/22/2021  - Note at rest 02 sats  96% on RA   General appearance:    amb wf nad     HEENT : pt wearing mask not removed for exam due to covid -19 concerns.  Ears canals opacified by wax bilaterally    NECK :  without JVD/Nodes/TM/ nl carotid upstrokes bilaterally   LUNGS: no acc muscle use,  Nl contour chest with very min crackles in bases  bilaterally without cough on insp or exp maneuvers   CV:  RRR  no s3 or murmur or increase in P2, and no edema   ABD:  soft and nontender with nl inspiratory excursion in the supine position. No bruits or organomegaly appreciated, bowel sounds nl  MS:  Nl gait/ ext warm without deformities, calf tenderness, cyanosis or clubbing No obvious joint restrictions   SKIN: warm and dry without lesions    NEURO:   alert, approp, nl sensorium with  no motor or cerebellar deficits apparent.        CXR PA and Lateral:   09/22/2021 :    I personally reviewed images and agree with radiology impression as follows:   1. Stable fibrotic changes. 2. No acute infiltrate.      Assessment:

## 2021-09-22 NOTE — Patient Instructions (Signed)
Make sure you check your oxygen saturation at your highest level of activity to be sure it stays over 90% and keep track of it at least once a week, more often if breathing getting worse, and let me know if losing ground.  ? ? ?Please remember to go to the  x-ray department  for your tests - we will call you with the results when they are available  ? ?Try Mucinex D  over the counter - you have to sign for it and follow up with ENT as planned  ? ?Please schedule a follow up visit in 3 months but call sooner if needed with PFTs same day  ?

## 2021-09-23 ENCOUNTER — Encounter: Payer: Self-pay | Admitting: Internal Medicine

## 2021-09-23 DIAGNOSIS — J310 Chronic rhinitis: Secondary | ICD-10-CM | POA: Insufficient documentation

## 2021-09-23 NOTE — Progress Notes (Signed)
Office Visit Note  [name]             Date of Birth: [birth year]           MRN: [id number]             PCP: Irena Reichmann, DO Referring: Irena Reichmann, DO Visit Date: 10/07/2021 Occupation: @GUAROCC @  Subjective:  Other (Patient reports left thumb pain and swelling. )   History of Present Illness: Brianna [name] is a 70 y.o. female with history of rheumatoid arthritis, interstitial lung disease and arthritis.  She states recently she has been having pain and discomfort in her left thumb.  She states she is having intermittent swelling.  She wants to have a cortisone injection.  She has been taking Humira 40 mg subcu every other week.  She was also evaluated by Dr. Sherene Sires who did chest x-ray and told her that her disease is a stable.  She is a scheduled to have PFTs.  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  She is getting Humira by Dr. Sherryll Burger.  The labs in December were within normal limits.  Activities of Daily Living:  Patient reports morning stiffness for 5-10 minutes.   Patient Reports nocturnal pain.  Difficulty dressing/grooming: Denies Difficulty climbing stairs: Denies Difficulty getting out of chair: Denies Difficulty using hands for taps, buttons, cutlery, and/or writing: Reports  Review of Systems  Constitutional:  Positive for fatigue.  HENT:  Positive for mouth dryness. Negative for mouth sores and nose dryness.   Eyes:  Positive for dryness. Negative for pain and itching.  Respiratory:  Negative for shortness of breath and difficulty breathing.   Cardiovascular:  Negative for chest pain and palpitations.  Gastrointestinal:  Negative for blood in stool, constipation and diarrhea.  Endocrine: Negative for increased urination.  Genitourinary:  Negative for difficulty urinating.  Musculoskeletal:  Positive for joint pain, joint pain, joint swelling and morning stiffness. Negative for myalgias, muscle tenderness and myalgias.  Skin:   Negative for color change, rash and redness.  Allergic/Immunologic: Negative for susceptible to infections.  Neurological:  Positive for headaches and memory loss. Negative for dizziness, numbness and weakness.  Hematological:  Positive for bruising/bleeding tendency.  Psychiatric/Behavioral:  Negative for confusion.    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   Pure hypercholesterolemia 08/28/2020   Vitamin D deficiency 08/28/2020   Exertional chest pain 06/24/2020   Hypertension 05/12/2020   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   Acute urinary tract infection 11/13/2019   Epilepsy (HCC) 11/13/2019   Corneal ulcer of left eye 12/05/2018   Acute left eye pain 12/05/2018   Endophthalmitis, acute, left 12/05/2018   URI, acute 05/19/2018   Cerumen impaction 05/19/2018   Laceration of left middle finger without foreign body without damage to nail 11/23/2017   Trigger thumb, right thumb 10/05/2017   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   Numbness 04/25/2017   Pain 04/25/2017   History  of macular degeneration 09/13/2016   History of migraine 09/06/2016   History of seizures 09/06/2016   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   Secondary corneal edema of right eye 06/28/2016   Iritis of right eye 05/12/2016   Rheumatoid arthritis with positive rheumatoid factor (HCC) 05/11/2016    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   High risk medication use 05/11/2016   Osteoporosis 05/11/2016   Intractable chronic migraine without aura and without status migrainosus 04/02/2016   Peripheral focal chorioretinal inflammation of both eyes 01/04/2016   Squamous blepharitis of upper and lower eyelids of both eyes 10/22/2015   Dry eyes, bilateral 10/21/2015   History of high risk medication treatment 04/28/2015   Cystoid macular edema of left eye 09/23/2014   History of carotid endarterectomy 06/27/2014   Acquired myogenic ptosis of both eyelids 06/07/2014   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   Migraine without aura 04/19/2013   Seizures (HCC) 04/19/2013   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   Keratoconus, 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   Minor corneal opacity 08/02/2011   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   Cough 01/14/2011   Pulmonary fibrosis (HCC) 01/14/2011    Past Medical History:  Diagnosis Date   Arthritis    in the neck   Carotid artery occlusion    Epilepsy (HCC)    GERD (gastroesophageal reflux disease)    Glaucoma    Hyperlipidemia    Hypertension    Keratoconus of both eyes 1981   Macular degeneration    Migraines    Peripheral vascular disease (HCC)    carotid occlusion surgery on left   Raynaud's disease    Retinal edema    Rheumatoid arthritis(714.0)    Stroke (HCC)    two mild strokes presumed from left carotid stenosis    Family History  Problem Relation Age of Onset   Heart disease Mother    Lung disease Mother        ? disease process   Uterine cancer Mother    Heart disease Father    Clotting disorder Father    Collagen disease Father    Cervical cancer Maternal Aunt    Prostate cancer Maternal Grandfather    Rheum arthritis Sister    High Cholesterol Sister    Epilepsy Sister    Rheum arthritis Maternal Grandmother    High Cholesterol Brother    High blood pressure Brother    High blood pressure Brother    High Cholesterol Brother    High blood  pressure Brother    High Cholesterol Brother    Past Surgical History:  Procedure Laterality Date   CAROTID ARTERY ANGIOPLASTY  2008   Dr Madilyn Fireman   CAROTID ENDARTERECTOMY Left 11-15-2007   cataract extraction Right 04-2014   [facility]   CHOLECYSTECTOMY  1995   CORNEAL TRANSPLANT     x 5 ; steroid inj. 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   [facility]   CORNEAL TRANSPLANT Left 04/17/2019   EYE SURGERY Right 12/2016   cornea repair    EYE SURGERY Right 09/20/2017   cornea transplant    GLAUCOMA SURGERY Right 2018   ORIF PATELLA Left 02/12/2021   Procedure: OPEN REDUCTION INTERNAL (ORIF) FIXATION PATELLA;  Surgeon: Yolonda Kida, MD;  Location: [facility] OR;  Service: Orthopedics;  Laterality: Left;   TRIGGER FINGER RELEASE Right 05/16/2018   Procedure: RELEASE TRIGGER FINGER/A-1 PULLEY RIGHT THUMB;  Surgeon: Cindee Salt, MD;  Location: [facility];  Service: Orthopedics;  Laterality: Right;   Social History  Social History Narrative   Patient is married to [name]), has 2 children   Patient is right handed   Education level is Bachelor's degree   Caffeine consumption is about 2 cups daily   Lives at home with husband    Immunization History  Administered Date(s) Administered   Influenza Split 04/26/2013, 04/08/2017   Influenza, High Dose Seasonal PF 04/09/2018   Influenza, Quadrivalent, Recombinant, Inj, Pf 04/21/2017, 03/19/2019   Influenza-Unspecified 04/19/2016   PFIZER(Purple Top)SARS-COV-2 Vaccination 08/24/2019, 09/18/2019, 05/12/2020, 10/29/2020   Pneumococcal Conjugate-13 08/27/2013, 07/05/2017   Pneumococcal Polysaccharide-23 01/14/2011   Tdap 11/15/2017   Zoster Recombinat (Shingrix) 07/19/2019, 11/28/2019     Objective: Vital Signs: BP 134/82 (BP Location: Left Arm, Patient Position: Sitting, Cuff Size: Normal)   Pulse 76   Ht 5\' 2"  (1.575 m)   Wt 115 lb 9.6 oz (52.4 kg)   BMI 21.14 kg/m    Physical Exam Vitals and  nursing note reviewed.  Constitutional:      Appearance: She is well-developed.  HENT:     Head: Normocephalic and atraumatic.  Eyes:     Conjunctiva/sclera: Conjunctivae normal.  Cardiovascular:     Rate and Rhythm: Normal rate and regular rhythm.     Heart sounds: Normal heart sounds.  Pulmonary:     Effort: Pulmonary effort is normal.     Breath sounds: Normal breath sounds.  Abdominal:     General: Bowel sounds are normal.     Palpations: Abdomen is soft.  Musculoskeletal:     Cervical back: Normal range of motion.  Lymphadenopathy:     Cervical: No cervical adenopathy.  Skin:    General: Skin is warm and dry.     Capillary Refill: Capillary refill takes less than 2 seconds.  Neurological:     Mental Status: She is alert and oriented to person, place, and time.  Psychiatric:        Behavior: Behavior normal.     Musculoskeletal Exam: C-spine was in good range of motion.  Shoulder joints, elbow joints, wrist joints with good range of motion.  She has thickening of MCPs PIPs and DIPs with tenderness on palpation over left first MCP joint.  Hip joints and knee joints with good range of motion without any warmth swelling or effusion.  She had no tenderness over ankles or MTPs.  CDAI Exam: CDAI Score: 2.2  Patient Global: 1 mm; Provider Global: 1 mm Swollen: 1 ; Tender: 1  Joint Exam 10/07/2021      Right  Left  MCP 1     Swollen Tender     Investigation: No additional findings.  Imaging: DG Chest 2 View  Result Date: 09/22/2021 CLINICAL DATA:  Cough, pulmonary fibrosis. EXAM: CHEST - 2 VIEW COMPARISON:  Chest x-ray 06/22/2019.  Chest CT 03/03/2020. FINDINGS: Fibrotic changes in both lung bases appear similar to the prior study. There is no new focal lung infiltrate, pleural effusion or pneumothorax identified. The cardiomediastinal silhouette is within normal limits. There are surgical clips in the right upper quadrant of the abdomen. No acute fractures are seen.  IMPRESSION: 1. Stable fibrotic changes. 2. No acute infiltrate. Electronically Signed   By: Darliss Cheney M.D.   On: 09/22/2021 16:31   US Guided Needle Placement  Result Date: 10/07/2021 Ultrasound guided injection is preferred based studies that show increased duration, increased effect, greater accuracy, decreased procedural pain, increased response rate, and decreased cost with ultrasound guided versus blind injection.   Verbal informed consent obtained.  Time-out conducted.  Noted no overlying erythema, induration, or other signs of local infection. Ultrasound-guided left first MCP joint: After sterile prep with Betadine, injected 0.5 mL of 1% lidocaine  and 10 mg Kenalog using a 27-gauge needle, left first MCP joint was injected.     Recent Labs: Lab Results  Component Value Date   WBC 11.6 (H) 02/14/2021   HGB 11.4 (L) 02/14/2021   PLT 191 02/14/2021   NA 140 03/18/2021   K 3.8 03/18/2021   CL 98 03/18/2021   CO2 29 03/18/2021   GLUCOSE 96 03/18/2021   BUN 11 03/18/2021   CREATININE 0.60 03/18/2021   BILITOT 0.2 03/18/2021   ALKPHOS 104 03/18/2021   AST 24 03/18/2021   ALT 16 03/18/2021   PROT 7.0 03/18/2021   ALBUMIN 4.3 03/18/2021   CALCIUM 8.9 03/18/2021   GFRAA 107 05/26/2020   QFTBGOLDPLUS NEGATIVE 05/31/2018    Speciality Comments: Last Reclast 11/30/2017   Procedures:  Small Joint Inj: L thumb MCP on 10/07/2021 4:20 PM Indications: pain and joint swelling Details: 27 G needle, ultrasound-guided dorsal approach  Spinal Needle: No  Medications: 0.5 mL lidocaine 1 %; 10 mg triamcinolone acetonide 40 MG/ML Aspirate: 0 mL Procedure, treatment alternatives, risks and benefits explained, specific risks discussed. Immediately prior to procedure a time out was called to verify the correct patient, procedure, equipment, support staff and site/side marked as required. Patient was prepped and draped in the usual sterile fashion.    Allergies: Morphine, Morphine and  related, Alphagan [brimonidine], Codeine, Dorzolamide hcl-timolol mal, Hydrocodone-acetaminophen, Sulfa antibiotics, and Sulfamethoxazole   Assessment / Plan:     Visit Diagnoses: Rheumatoid arthritis involving multiple sites with positive rheumatoid factor (HCC) patient is clinically doing well on Humira 40 mg subcu q. 14 days.  She denies any increased pain or swelling in her joints.  She has been tolerating Humira without any side effects.  Chronic pain of left thumb -she has been having discomfort in her left first MCP joint off-and-on for the last few months.  She states the pain has been worse recently.  She is having difficulty doing routine activities.  She requests having left thumb cortisone injection.  Per her request after informed consent was obtained the left first MCP joint was prepped in sterile fashion injected with lidocaine and cortisone as described above.  She tolerated the procedure well.  Postprocedure instructions were given.  Splint was applied to be used for 3 days.  She was advised to contact us in case her symptoms flare.  High risk medication use - Humira 40 mg sq every 14 days prescribed by Dr. Sherryll Burger.  Labs also monitored by Dr. Sherryll Burger.  Last labs were in December.  Information about immunization was provided.  She was also advised to hold Humira in case she develops an infection and resume after the infection resolves.  Annual skin examination to screen for skin cancer was also advised.  ILD (interstitial lung disease) (HCC) - She is followed by Dr. Sherene Sires.  Patient had recent chest x-ray.  Her last high-resolution CT was in 2021.  She is a scheduled to have PFTs per patient.  Iritis of right eye - Followed by Dr.Shah, Dr. Dan Humphreys, and Dr. Lottie Dawson. She is on Humira 40 mg subcutaneous injections every 14 days, which is prescribed by Dr. Sherryll Burger.   Trochanteric bursitis of both hips - Resolved.  DDD (degenerative disc disease), lumbar-  Age-related osteoporosis without current  pathological fracture - DEXA on 11/13/19: L Left femoral  neck T-score -3.0, Right FN T-score -2.6, distal 1/2 left radius T-score -3.3.Previously d/c fosasamx due to GERD.  She had 1 Reclast infusion and then she stopped it as she was concerned about ONJ.  We applied for Forteo.  She is waiting on the insurance approval.  I will forward the information to her pharmacist.  Other medical problems are listed as follows:  History of seizures  History of carotid endarterectomy  History of glaucoma  History of macular degeneration  History of migraine  Orders: Orders Placed This Encounter  Procedures   Small Joint Inj: L thumb MCP   US Guided Needle Placement   No orders of the defined types were placed in this encounter.    Follow-Up Instructions: Return in about 5 months (around 03/09/2022) for Rheumatoid arthritis, ILD.   Pollyann Savoy, MD  Note - This record has been created using Animal nutritionist.  Chart creation errors have been sought, but may not always  have been located. Such creation errors do not reflect on  the standard of medical care.

## 2021-09-23 NOTE — Assessment & Plan Note (Signed)
Advised mucinex d and f/u with ent next week as planned ? ? ?    ?  ? ?Each maintenance medication was reviewed in detail including emphasizing most importantly the difference between maintenance and prns and under what circumstances the prns are to be triggered using an action plan format where appropriate. ? ?Total time for H and P, chart review, counseling,  and generating customized AVS unique to this office visit / same day charting =20 min ?     ?

## 2021-09-23 NOTE — Assessment & Plan Note (Signed)
Assoc with RA  ?    - PFT's 06/15/2006 FEV1 (1.53)  Ratio 84 and DLC0 46 corrects to 90% ?    - PFT's  03/04/2011  FEV1 (1.73)  Ratio 83 and VC 2.11 and DLC0 63% corrects  94% ?    - PFT's 11/13/2013    VC 1.94 with DLCO 65 corrects to 102% ?    - 08/27/2013  Walked RA x 2 laps @ 185 ft each stopped due to  Oximeter stopped recording accurately but sats still 96% and no sob ?- PFT's  06/14/2017  FVC 1.71  (60 % ) ratio 89  p 5 % improvement from saba p nothing prior to study with DLCO  59 % corrects to 97 % for alv volume   ?PFT's  06/21/2018  FVC  1.85 (65%)   p no % improvement from saba p nothing prior to study with DLCO  65 % corrects to 106  % for alv volume   ?PFT's  06/22/2019  FVC 1.68 (58 % )   with DLCO  12.93 (70%) corrects to 4.59 (108 %)  for alv volume and FV curve nl   ?CHEST  HRCT  03/03/20  ?1. Pulmonary parenchymal pattern of fibrosis appears grossly stable ?from 07/21/2010. Findings are consistent with UIP per consensus ?guidelines: Diagnosis of Idiopathic Pulmonary Fibrosis: An Official ?ATS/ERS/JRS/ALAT Clinical Practice Guideline. Am Rosezetta Schlatter Crit Care ?Med Vol 198, Iss 5, [id number], Mar 19 2017. ?2. Aortic atherosclerosis (ICD10-I70.0). Coronary artery ?calcification. ?3. 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      ?- Echo   05/12/20  LVH nod / GII diastolic dysfunction /  PAS around 40 with nl RV ?-  06/23/2020   Walked RA  3laps @ approx 237ft each @ brisk pace  stopped due to end of study, no desats, no sob or CP with sats at end = 97% ? ?Advised no evidence of PF progression related to RA and best way to monitor: ?Make sure you check your oxygen saturation at your highest level of activity to be sure it stays over 90% and keep track of it at least once a week, more often if breathing getting worse, and let me know if losing ground.  ?

## 2021-09-30 DIAGNOSIS — H6120 Impacted cerumen, unspecified ear: Secondary | ICD-10-CM | POA: Diagnosis not present

## 2021-10-07 ENCOUNTER — Ambulatory Visit (INDEPENDENT_AMBULATORY_CARE_PROVIDER_SITE_OTHER): Payer: Medicare PPO

## 2021-10-07 ENCOUNTER — Other Ambulatory Visit: Payer: Self-pay

## 2021-10-07 ENCOUNTER — Telehealth: Payer: Self-pay

## 2021-10-07 ENCOUNTER — Encounter: Payer: Self-pay | Admitting: Rheumatology

## 2021-10-07 ENCOUNTER — Ambulatory Visit: Payer: Medicare PPO | Admitting: Rheumatology

## 2021-10-07 VITALS — BP 134/82 | HR 76 | Ht 62.0 in | Wt 115.6 lb

## 2021-10-07 DIAGNOSIS — M7062 Trochanteric bursitis, left hip: Secondary | ICD-10-CM

## 2021-10-07 DIAGNOSIS — Z87898 Personal history of other specified conditions: Secondary | ICD-10-CM

## 2021-10-07 DIAGNOSIS — Z79899 Other long term (current) drug therapy: Secondary | ICD-10-CM

## 2021-10-07 DIAGNOSIS — G8929 Other chronic pain: Secondary | ICD-10-CM

## 2021-10-07 DIAGNOSIS — M0579 Rheumatoid arthritis with rheumatoid factor of multiple sites without organ or systems involvement: Secondary | ICD-10-CM | POA: Diagnosis not present

## 2021-10-07 DIAGNOSIS — J849 Interstitial pulmonary disease, unspecified: Secondary | ICD-10-CM | POA: Diagnosis not present

## 2021-10-07 DIAGNOSIS — M79645 Pain in left finger(s): Secondary | ICD-10-CM

## 2021-10-07 DIAGNOSIS — H209 Unspecified iridocyclitis: Secondary | ICD-10-CM | POA: Diagnosis not present

## 2021-10-07 DIAGNOSIS — M5136 Other intervertebral disc degeneration, lumbar region: Secondary | ICD-10-CM | POA: Diagnosis not present

## 2021-10-07 DIAGNOSIS — M810 Age-related osteoporosis without current pathological fracture: Secondary | ICD-10-CM

## 2021-10-07 DIAGNOSIS — Z9889 Other specified postprocedural states: Secondary | ICD-10-CM

## 2021-10-07 DIAGNOSIS — Z8669 Personal history of other diseases of the nervous system and sense organs: Secondary | ICD-10-CM

## 2021-10-07 DIAGNOSIS — M7061 Trochanteric bursitis, right hip: Secondary | ICD-10-CM | POA: Diagnosis not present

## 2021-10-07 MED ORDER — TRIAMCINOLONE ACETONIDE 40 MG/ML IJ SUSP
10.0000 mg | INTRAMUSCULAR | Status: AC | PRN
  Administered 2021-10-07: 10 mg via INTRA_ARTICULAR

## 2021-10-07 MED ORDER — LIDOCAINE HCL 1 % IJ SOLN
0.5000 mL | INTRAMUSCULAR | Status: AC | PRN
  Administered 2021-10-07: .5 mL

## 2021-10-07 NOTE — Telephone Encounter (Signed)
Patient was seen in the office today and Dr. Estanislado Pandy would like to know the status of forteo. Thanks!  ?

## 2021-10-08 ENCOUNTER — Other Ambulatory Visit: Payer: Self-pay | Admitting: Cardiology

## 2021-10-08 DIAGNOSIS — E785 Hyperlipidemia, unspecified: Secondary | ICD-10-CM

## 2021-10-08 DIAGNOSIS — H401112 Primary open-angle glaucoma, right eye, moderate stage: Secondary | ICD-10-CM | POA: Diagnosis not present

## 2021-10-08 DIAGNOSIS — H401123 Primary open-angle glaucoma, left eye, severe stage: Secondary | ICD-10-CM | POA: Diagnosis not present

## 2021-10-08 DIAGNOSIS — I10 Essential (primary) hypertension: Secondary | ICD-10-CM

## 2021-10-08 MED ORDER — ROSUVASTATIN CALCIUM 20 MG PO TABS: 20.0000 mg | ORAL_TABLET | Freq: Every day | ORAL | 0 refills | Status: DC

## 2021-10-08 MED ORDER — CHLORTHALIDONE 25 MG PO TABS: 25.0000 mg | ORAL_TABLET | Freq: Every day | ORAL | 0 refills | Status: DC

## 2021-10-08 NOTE — Telephone Encounter (Signed)
Pt needed repatha sent to a different pharmacy. Please address ?

## 2021-10-08 NOTE — Telephone Encounter (Signed)
?*  STAT* If patient is at the pharmacy, call can be transferred to refill team. ? ? ?1. Which medications need to be refilled? (please list name of each medication and dose if known)  ?amLODipine (NORVASC) 10 MG tablet ?chlorthalidone (HYGROTON) 25 MG tablet ?Evolocumab (REPATHA SURECLICK) 140 MG/ML SOAJ ?ezetimibe (ZETIA) 10 MG tablet ?losartan (COZAAR) 100 MG tablet ?rosuvastatin (CRESTOR) 20 MG tablet ?spironolactone (ALDACTONE) 25 MG tablet ? ?2. Which pharmacy/location (including street and city if local pharmacy) is medication to be sent to? ?[place] ? ?3. Do they need a 30 day or 90 day supply? 90 with refills ? ?Patient is scheduled to see Dr. Anne Fu 11/23/21 ?

## 2021-10-08 NOTE — Telephone Encounter (Signed)
Submitted a Prior Authorization request to [facility] for FORTEO via CoverMyMeds. Will update once we receive a response. ? ?Key: [id number] ? ?Patient dropped off stack of 2021 ans 2022 bills for her and her husband since she will likely be denied [employer] PAP initially. Patient signed her portion of application. Provider portion placed in Dr. Fatima Sanger folder to be signed. ? ?She will plan to collect her 2022 tax return and drop off to clinic this upcoming Tuesday or Wednesday. Will return her medical bills stack of papers to her then. ? ?Chesley Mires, PharmD, MPH, BCPS ?Clinical Pharmacist (Rheumatology and Pulmonology) ?

## 2021-10-08 NOTE — Telephone Encounter (Signed)
Called and lmom pt to call us back and let me know where to send the repatha refill ?

## 2021-10-09 ENCOUNTER — Other Ambulatory Visit (HOSPITAL_COMMUNITY): Payer: Self-pay

## 2021-10-09 ENCOUNTER — Other Ambulatory Visit: Payer: Self-pay | Admitting: Pharmacist Clinician (PhC)/ Clinical Pharmacy Specialist

## 2021-10-09 DIAGNOSIS — E785 Hyperlipidemia, unspecified: Secondary | ICD-10-CM

## 2021-10-09 MED ORDER — REPATHA SURECLICK 140 MG/ML ~~LOC~~ SOAJ: 1.0000 "pen " | SUBCUTANEOUS | 4 refills | Status: DC

## 2021-10-09 NOTE — Telephone Encounter (Signed)
Provider portion of [employer] PAP application for Forteo received. ? ?Copay for 28 days supply is $100 ? ?PAP application submission is pending patient's tax return which she will drop off upcoming week ? ?Chesley Mires, PharmD, MPH, BCPS ?Clinical Pharmacist (Rheumatology and Pulmonology) ?

## 2021-10-14 NOTE — Telephone Encounter (Signed)
Submitted Patient Assistance Application to [facility] for FORTEO along with provider portion, patient portion, PA, med list,  and income documents. Will update patient when we receive a response. ? ?Additional bills, income docs, etc for appeal submission in rheum office on my desk for submission after initial PAP denial ? ?Fax# [phone] ?Phone# [phone] ? ?Chesley Mires, PharmD, MPH, BCPS ?Clinical Pharmacist (Rheumatology and Pulmonology) ? ?

## 2021-10-15 DIAGNOSIS — T868419 Corneal transplant failure, unspecified eye: Secondary | ICD-10-CM | POA: Diagnosis not present

## 2021-10-15 DIAGNOSIS — Z947 Corneal transplant status: Secondary | ICD-10-CM | POA: Diagnosis not present

## 2021-10-15 DIAGNOSIS — Z961 Presence of intraocular lens: Secondary | ICD-10-CM | POA: Diagnosis not present

## 2021-10-15 DIAGNOSIS — H04123 Dry eye syndrome of bilateral lacrimal glands: Secondary | ICD-10-CM | POA: Diagnosis not present

## 2021-10-15 DIAGNOSIS — H401123 Primary open-angle glaucoma, left eye, severe stage: Secondary | ICD-10-CM | POA: Diagnosis not present

## 2021-10-15 DIAGNOSIS — H353221 Exudative age-related macular degeneration, left eye, with active choroidal neovascularization: Secondary | ICD-10-CM | POA: Diagnosis not present

## 2021-10-15 DIAGNOSIS — Z79899 Other long term (current) drug therapy: Secondary | ICD-10-CM | POA: Diagnosis not present

## 2021-10-15 DIAGNOSIS — H401112 Primary open-angle glaucoma, right eye, moderate stage: Secondary | ICD-10-CM | POA: Diagnosis not present

## 2021-10-27 NOTE — Telephone Encounter (Signed)
Received fax from [facility] for FORTEO patient assistance, patient's application has been DENIED due to: exceeding financial criteria for the program. ? ?Will prepare appeal for PAP reconsideration ?  ?Phone# [phone] ? ?Chesley Mires, PharmD, MPH, BCPS ?Clinical Pharmacist (Rheumatology and Pulmonology) ? ?

## 2021-10-29 NOTE — Telephone Encounter (Signed)
Appeal (201 pages today) for Forteo PAP reconsideration submitted. Reviewed appeal letter with patient on phone and she okay'd submission. Patient advised that this is the first and only reconsideration step and that copay for Forteo is $100 per month through insurance. ? ?Fax: [phone] ?Phone: [phone] ? ?Chesley Mires, PharmD, MPH, BCPS ?Clinical Pharmacist (Rheumatology and Pulmonology) ?

## 2021-10-30 DIAGNOSIS — H0288A Meibomian gland dysfunction right eye, upper and lower eyelids: Secondary | ICD-10-CM | POA: Diagnosis not present

## 2021-10-30 DIAGNOSIS — H401112 Primary open-angle glaucoma, right eye, moderate stage: Secondary | ICD-10-CM | POA: Diagnosis not present

## 2021-10-30 DIAGNOSIS — H04123 Dry eye syndrome of bilateral lacrimal glands: Secondary | ICD-10-CM | POA: Diagnosis not present

## 2021-10-30 DIAGNOSIS — H401123 Primary open-angle glaucoma, left eye, severe stage: Secondary | ICD-10-CM | POA: Diagnosis not present

## 2021-10-30 DIAGNOSIS — H0288B Meibomian gland dysfunction left eye, upper and lower eyelids: Secondary | ICD-10-CM | POA: Diagnosis not present

## 2021-10-30 DIAGNOSIS — H21503 Unspecified adhesions of iris, bilateral: Secondary | ICD-10-CM | POA: Diagnosis not present

## 2021-10-30 DIAGNOSIS — Z961 Presence of intraocular lens: Secondary | ICD-10-CM | POA: Diagnosis not present

## 2021-10-30 DIAGNOSIS — T868419 Corneal transplant failure, unspecified eye: Secondary | ICD-10-CM | POA: Diagnosis not present

## 2021-10-30 DIAGNOSIS — Z947 Corneal transplant status: Secondary | ICD-10-CM | POA: Diagnosis not present

## 2021-10-30 DIAGNOSIS — T868402 Corneal transplant rejection, left eye: Secondary | ICD-10-CM | POA: Diagnosis not present

## 2021-10-30 DIAGNOSIS — H353221 Exudative age-related macular degeneration, left eye, with active choroidal neovascularization: Secondary | ICD-10-CM | POA: Diagnosis not present

## 2021-11-06 DIAGNOSIS — Z79899 Other long term (current) drug therapy: Secondary | ICD-10-CM | POA: Diagnosis not present

## 2021-11-06 DIAGNOSIS — H401112 Primary open-angle glaucoma, right eye, moderate stage: Secondary | ICD-10-CM | POA: Diagnosis not present

## 2021-11-06 DIAGNOSIS — H401123 Primary open-angle glaucoma, left eye, severe stage: Secondary | ICD-10-CM | POA: Diagnosis not present

## 2021-11-12 ENCOUNTER — Other Ambulatory Visit: Payer: Self-pay | Admitting: Internal Medicine

## 2021-11-15 ENCOUNTER — Other Ambulatory Visit: Payer: Self-pay | Admitting: Internal Medicine

## 2021-11-17 DIAGNOSIS — H3581 Retinal edema: Secondary | ICD-10-CM | POA: Diagnosis not present

## 2021-11-17 DIAGNOSIS — H401112 Primary open-angle glaucoma, right eye, moderate stage: Secondary | ICD-10-CM | POA: Diagnosis not present

## 2021-11-17 DIAGNOSIS — Z947 Corneal transplant status: Secondary | ICD-10-CM | POA: Diagnosis not present

## 2021-11-17 DIAGNOSIS — H30102 Unspecified disseminated chorioretinal inflammation, left eye: Secondary | ICD-10-CM | POA: Diagnosis not present

## 2021-11-17 DIAGNOSIS — H401123 Primary open-angle glaucoma, left eye, severe stage: Secondary | ICD-10-CM | POA: Diagnosis not present

## 2021-11-17 DIAGNOSIS — H353221 Exudative age-related macular degeneration, left eye, with active choroidal neovascularization: Secondary | ICD-10-CM | POA: Diagnosis not present

## 2021-11-17 DIAGNOSIS — H30033 Focal chorioretinal inflammation, peripheral, bilateral: Secondary | ICD-10-CM | POA: Diagnosis not present

## 2021-11-17 DIAGNOSIS — Z79899 Other long term (current) drug therapy: Secondary | ICD-10-CM | POA: Diagnosis not present

## 2021-11-17 MED ORDER — INSULIN PEN NEEDLE 31G X 5 MM MISC: 2 refills | Status: AC

## 2021-11-17 NOTE — Telephone Encounter (Signed)
Called [employer] P[employer] for update on patient's Forteo appeal. Per rep's manager, patient does not meet exemption criteria to qualify for [employer]' FORTEO patient assistance. One manager said that the appeal letter that was submitted does not count as letter. Second manager states they will reprocess appeal since the 200-page appeal that was faxed was split into two pdf's and one pdf was denied since no appeal letter was attached to it. ? ?Turnaround time is within 24 hours. ? ?Phone: [phone] ? ?Chesley Mires, PharmD, MPH, BCPS, CPP ?Clinical Pharmacist (Rheumatology and Pulmonology) ?

## 2021-11-17 NOTE — Addendum Note (Signed)
Addended by: Cassandria Anger on: 11/17/2021 12:02 PM ? ? Modules accepted: Orders ? ?

## 2021-11-17 NOTE — Telephone Encounter (Addendum)
[facility] an approval for FORTEO patient assistance from 11/17/21 to 07/18/22.  ? ?Phone number: [phone] ? ?Called patient regarding PAP approval. Advised her to continue collecting medical bills for her and her husband for this year since we will have to pursue appeal for re-enrollment in 2024. She verbalized understanding. She states that she and her husband are also training a dog to be medically trained to help with her sight. She asked if she could submit these bills as well - I advised that we could but I could not determine if they would be taken into consideration. ? ?Provided her with [employer] phone number and advised her to reach out to schedule shipment at end of week. [facility] ? ?She will reach out to me if she wants to complete first dose in clinic together after reading directions. She has never used pen needles but does self-administer other injections. Reviewed pen administration over the phone. Reviewed that pen is good for 28 days from first day of use. Reviewed possible sites of administration including abdomen and thigh. ? ?Chesley Miresevki Vinaya Sancho, PharmD, MPH, BCPS, CPP ?Clinical Pharmacist (Rheumatology and Pulmonology)  ?

## 2021-11-18 DIAGNOSIS — H30033 Focal chorioretinal inflammation, peripheral, bilateral: Secondary | ICD-10-CM | POA: Diagnosis not present

## 2021-11-18 DIAGNOSIS — Z79899 Other long term (current) drug therapy: Secondary | ICD-10-CM | POA: Diagnosis not present

## 2021-11-23 ENCOUNTER — Ambulatory Visit: Payer: Medicare PPO | Admitting: Cardiology

## 2021-11-23 ENCOUNTER — Encounter: Payer: Self-pay | Admitting: Cardiology

## 2021-11-23 DIAGNOSIS — E785 Hyperlipidemia, unspecified: Secondary | ICD-10-CM

## 2021-11-23 DIAGNOSIS — E7800 Pure hypercholesterolemia, unspecified: Secondary | ICD-10-CM

## 2021-11-23 DIAGNOSIS — I6522 Occlusion and stenosis of left carotid artery: Secondary | ICD-10-CM

## 2021-11-23 DIAGNOSIS — I2510 Atherosclerotic heart disease of native coronary artery without angina pectoris: Secondary | ICD-10-CM | POA: Diagnosis not present

## 2021-11-23 DIAGNOSIS — I10 Essential (primary) hypertension: Secondary | ICD-10-CM

## 2021-11-23 MED ORDER — ROSUVASTATIN CALCIUM 20 MG PO TABS: 20.0000 mg | ORAL_TABLET | Freq: Every day | ORAL | 11 refills | Status: DC

## 2021-11-23 MED ORDER — LOSARTAN POTASSIUM 100 MG PO TABS: 100.0000 mg | ORAL_TABLET | Freq: Every day | ORAL | 11 refills | Status: DC

## 2021-11-23 MED ORDER — CHLORTHALIDONE 25 MG PO TABS: 25.0000 mg | ORAL_TABLET | Freq: Every day | ORAL | 11 refills | Status: DC

## 2021-11-23 MED ORDER — EZETIMIBE 10 MG PO TABS: 10.0000 mg | ORAL_TABLET | Freq: Every day | ORAL | 11 refills | Status: DC

## 2021-11-23 MED ORDER — AMLODIPINE BESYLATE 10 MG PO TABS: 10.0000 mg | ORAL_TABLET | Freq: Every day | ORAL | 11 refills | Status: DC

## 2021-11-23 NOTE — Progress Notes (Signed)
?Cardiology Office Note:   ? ?Date:  11/23/2021  ? ?ID:  Brianna [name], DOB Nov 02, 1950, MRN [id number] ? ?PCP:  [name], DO ?  ?[facility] Prov[facility] ?Cardiologist:  Candee Furbish, MD    ? ?Referring MD: [name], DO  ? ? ?History of Present Illness:   ? ?Brianna [name] is a 71 y.o. female here for the follow-up of hypertension and originally seen in 2021. ? ?Previously denied any chest pain shortness of breath had corneal transplant left eye.  Pulmonary trunk was enlarged on CT scan 31 mm indicative of underlying possible pulmonary hypertension.  He has a family history of his father dying from myocardial infarction.  Coronary artery calcifications previously noted on CT. ? ?She used to be on statin therapy, atorvastatin however it conflicted with leflunomide, medication for retina she is now on it 3 days a week at 20 mg. ? ?Now she is currently on Repatha, Crestor 20 as well as Zetia 10.  Doing very well.  Last LDL 49. ? ?Has 3v calc CAD. ? ?2 minor strokes. Left carotid.  ? ? ? ?Past Medical History:  ?Diagnosis Date  ? Arthritis   ? in the neck  ? Carotid artery occlusion   ? Epilepsy ([place])   ? GERD (gastroesophageal reflux disease)   ? Glaucoma   ? Hyperlipidemia   ? Hypertension   ? Keratoconus of both eyes 1981  ? Macular degeneration   ? Migraines   ? Peripheral vascular disease ([place])   ? carotid occlusion surgery on left  ? Raynaud's disease   ? Retinal edema   ? Rheumatoid arthritis(714.0)   ? Stroke [facility])   ? two mild strokes presumed from left carotid stenosis  ? ? ?Past Surgical History:  ?Procedure Laterality Date  ? CAROTID ARTERY ANGIOPLASTY  2008  ? Dr Amedeo Plenty  ? CAROTID ENDARTERECTOMY Left 11-15-2007  ? cataract extraction Right 04-2014  ? [place]  ? 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 retnal information  ? CORNEAL TRANSPLANT Right 01-22-2014  ? [facility]  ? CORNEAL TRANSPLANT Left 04/17/2019  ? EYE SURGERY Right 12/2016  ? cornea repair   ? EYE  SURGERY Right 09/20/2017  ? cornea transplant   ? GLAUCOMA SURGERY Right 2018  ? ORIF PATELLA Left 02/12/2021  ? Procedure: OPEN REDUCTION INTERNAL (ORIF) FIXATION PATELLA;  Surgeon: Nicholes Stairs, MD;  Location: [place];  Service: Orthopedics;  Laterality: Left;  ? TRIGGER FINGER RELEASE Right 05/16/2018  ? Procedure: RELEASE TRIGGER FINGER/A-1 PULLEY RIGHT THUMB;  Surgeon: Daryll Brod, MD;  Location: [place];  Service: Orthopedics;  Laterality: Right;  ? ? ?Current Medications: ?Current Meds  ?Medication Sig  ? ascorbic Acid (VITAMIN C) 500 MG CPCR Take 500 mg by mouth daily.  ? aspirin 325 MG tablet Take 325 mg by mouth daily.  ? botulinum toxin Type A (BOTOX) 100 units SOLR injection Inject IM into the head and neck muscles every 3 months by provider in office  ? butalbital-acetaminophen-caffeine (FIORICET, ESGIC) 50-325-40 MG tablet Take 1 tablet by mouth every 6 (six) hours as needed for headache.  ? Calcium Carbonate-Vit D-Min (CALCIUM 1200 PO) Take 2 capsules by mouth as needed.  ? Cholecalciferol (VITAMIN D PO) Take 1,000 Units by mouth daily.   ? DILANTIN 100 MG ER capsule TAKE 3 CAPSULES BY MOUTH DAILY  ? docusate sodium (COLACE) 100 MG capsule Take 1 capsule (100 mg  total) by mouth 2 (two) times daily.  ? Erenumab-aooe (AIMOVIG) 140 MG/ML SOAJ Inject 140 mg into the skin every 30 (thirty) days.  ? Evolocumab (REPATHA SURECLICK) [id number] MG/ML SOAJ Inject 1 pen. into the skin every 14 (fourteen) days.  ? famotidine (PEPCID) 20 MG tablet TAKE 1 TABLET(20 MG) BY MOUTH AT BEDTIME (Patient taking differently: Take 20 mg by mouth 2 (two) times daily.)  ? HUMIRA PEN 40 MG/0.4ML PNKT Inject 40 mg into the skin every 14 (fourteen) days. As directed  ? Insulin Pen Needle 31G X 5 MM MISC Use to inject Forteo once daily. DO NOT REUSE. Forteo received through patient assistance  ? leflunomide (ARAVA) 20 MG tablet Take 20 mg by mouth daily.  ? LORazepam (ATIVAN) 0.5 MG tablet Take 0.5 mg by mouth  daily as needed for anxiety or sleep.  ? pantoprazole (PROTONIX) 40 MG tablet TAKE 1 TABLET(40 MG) BY MOUTH DAILY  ? promethazine (PHENERGAN) 25 MG tablet Take 1 tablet (25 mg total) by mouth every 6 (six) hours as needed for nausea or vomiting.  ? RESTASIS 0.05 % ophthalmic emulsion 2 (two) times daily.  ? spironolactone (ALDACTONE) 25 MG tablet Take 0.5 tablets (12.5 mg total) by mouth daily.  ? SUMAtriptan (IMITREX) 6 MG/0.5ML SOLN injection Inject 0.5 mLs (6 mg total) into the skin every 2 (two) hours as needed for migraine or headache. Take one dose at headache onset, can take additional dose 2hrs later if needed. No more then 2 injections in 24hrs  ? tacrolimus (PROGRAF) 1 MG capsule Take 1 mg by mouth 2 (two) times daily.  ? [DISCONTINUED] amLODipine (NORVASC) 10 MG tablet Take 1 tablet (10 mg total) by mouth daily.  ? [DISCONTINUED] chlorthalidone (HYGROTON) 25 MG tablet Take 1 tablet (25 mg total) by mouth daily.  ? [DISCONTINUED] ezetimibe (ZETIA) 10 MG tablet Take 1 tablet (10 mg total) by mouth daily.  ? [DISCONTINUED] losartan (COZAAR) 100 MG tablet Take 1 tablet (100 mg total) by mouth daily.  ? [DISCONTINUED] rosuvastatin (CRESTOR) 20 MG tablet Take 1 tablet (20 mg total) by mouth daily. Please keep upcoming appt with Dr. Marlou Porch in May 2023 before anymore refills. Thank you Final attempt  ? ?Current Facility-Administered Medications for the 11/23/21 encounter (Office Visit) with Jerline Pain, MD  ?Medication  ? 0.9 %  sodium chloride infusion  ?  ? ?Allergies:   Morphine, Morphine and related, Alphagan [brimonidine], Codeine, Dorzolamide hcl-timolol mal, Hydrocodone-acetaminophen, Sulfa antibiotics, and Sulfamethoxazole  ? ?Social History  ? ?Socioeconomic History  ? Marital status: Married  ?  Spouse name: Thayer Jew  ? Number of children: 2  ? Years of education: Bachelor  ? Highest education level: Bachelor's degree (e.g., BA, AB, BS)  ?Occupational History  ? Occupation: Pharmacist, hospital  ? Occupation: AG  TEACHER  ?  Employer: [employer]  ?  Comment: retired  ?Tobacco Use  ? Smoking status: Former  ?  Packs/day: 0.50  ?  Years: 14.00  ?  Pack years: 7.00  ?  Types: Cigarettes  ?  Quit date: 1989  ?  Years since quitting: 34.3  ?  Passive exposure: Never  ? Smokeless tobacco: Never  ?Vaping Use  ? Vaping Use: Never used  ?Substance and Sexual Activity  ? Alcohol use: No  ? Drug use: No  ? Sexual activity: Not on file  ?Other Topics Concern  ? Not on file  ?Social History Narrative  ? Patient is married to Thayer Jew), has 2 children  ?  Patient is right handed  ? Education level is Bachelor's degree  ? Caffeine consumption is about 2 cups daily  ? Lives at home with husband   ? ?Social Determinants of Health  ? ?Financial Resource Strain: Not on file  ?Food Insecurity: Not on file  ?Transportation Needs: Not on file  ?Physical Activity: Not on file  ?Stress: Not on file  ?Social Connections: Not on file  ?  ? ?Family History: ?[name] family history includes Cervical cancer in her maternal aunt; Clotting disorder in her father; Collagen disease in her father; Epilepsy in her sister; Heart disease in her father and mother; High Cholesterol in her brother, brother, brother, and sister; High blood pressure in her brother, brother, and brother; Lung disease in her mother; Prostate cancer in her maternal grandfather; Rheum arthritis in her maternal grandmother and sister; Uterine cancer in her mother. ? ?ROS:   ?Please see the history of present illness.    ? All other systems reviewed and are negative. ? ?EKGs/Labs/Other Studies Reviewed:   ? ?The following studies were reviewed today: ? ?ECHO 2021 ? ? ? 1. Left ventricular ejection fraction, by estimation, is 65 to 70%. The  ?left ventricle has normal function. The left ventricle has no regional  ?wall motion abnormalities. There is moderate concentric left ventricular  ?hypertrophy. Left ventricular  ?diastolic parameters are consistent with Grade II  diastolic dysfunction  ?(pseudonormalization). Elevated left atrial pressure. The average left  ?ventricular global longitudinal strain is -23.3 %. The global longitudinal  ?strain is normal.  ? 2. Right ventricular syst

## 2021-11-23 NOTE — Assessment & Plan Note (Signed)
Repatha, Zetia, Crestor.  Continue with current medical management.  Excellent.  LDL 49.  No myalgias.  Expressed the importance of plaque stabilization with these medications. ?

## 2021-11-23 NOTE — Assessment & Plan Note (Signed)
Multivessel coronary artery disease noted, coronary artery calcification.  Personally reviewed with her and interpreted her CT scan.  Left main LAD circumflex and mild right coronary artery calcification.  Continue with aggressive risk factor prevention.  Family history of CAD noted.  She is not having any shortness of breath no chest pain no anginal symptoms.  Discussed the benefits of aggressive medical therapy and lipid-lowering.  Last LDL 49.  Excellent.  She will let us know if she is having any symptoms.  Cardiac PET could be beneficial to look for flow analysis given her triple-vessel disease. ?

## 2021-11-23 NOTE — Patient Instructions (Signed)

## 2021-11-23 NOTE — Assessment & Plan Note (Signed)
Left-sided carotid endarterectomy.  Prior right-sided strokes.  Aggressive risk factor prevention. ?

## 2021-11-26 ENCOUNTER — Telehealth: Payer: Self-pay | Admitting: Rheumatology

## 2021-11-26 NOTE — Telephone Encounter (Signed)
Patient called the office stating she was to inform [facility] that her [place] should arrive sometime tomorrow. Patient states it hasn't been shipped but is set to be shipped today.  ?

## 2021-11-26 NOTE — Telephone Encounter (Signed)
Called patient to determine status of her Forteo and if she had started. She states she forgot to call and has not yet received Forteo at home. Provided her again with [employer] phone number to call and schedule shipment. She states she will call them and return call to clinic w update. ? ?Chesley Mires, PharmD, MPH, BCPS, CPP ?Clinical Pharmacist (Rheumatology and Pulmonology) ?

## 2021-11-26 NOTE — Telephone Encounter (Signed)
Noted. Closing encounter  Layton Naves, PharmD, MPH, BCPS, CPP Clinical Pharmacist (Rheumatology and Pulmonology) 

## 2021-12-01 ENCOUNTER — Ambulatory Visit: Payer: Medicare PPO | Admitting: Neurology

## 2021-12-01 DIAGNOSIS — G43711 Chronic migraine without aura, intractable, with status migrainosus: Secondary | ICD-10-CM

## 2021-12-01 NOTE — Progress Notes (Signed)
Botox- 200 units x 1 vial Lot: [id number] Expiration: 06/2024 NDC: 0023-3921-02  Bacteriostatic 0.9% Sodium Chloride- 4mL total Lot: [id number] Expiration: 02/17/2023 NDC: 0409-1966-02  Dx: G43.711 B/B 

## 2021-12-01 NOTE — Progress Notes (Signed)
12/01/2021: stable ? ?09/08/2021: stable ? ?Interval history 06/09/2021: stable. No corrugators or procerus. Very high frontalis.  Broke her patella in a wheelchair now.>70% improvement in frequency of migraines, Baseline 15 severemigraine days a month and daily headaches. ? ?Interval history 03/10/2021: stable. No corrugators or procerus. Very high frontalis. Rest in traps. Broke her patella in a wheelchair now. ? ?Interval history 12/01/2020: stable ? ?Send to Dr. Vickey Huger, intractable headches, morning headaches: Intractable headaches, wakes with headaches, has headaches when she wakes in the morning, hx of strokes: sleep evaluation ? ?Imitrex injections: Patient has intractable chronic migraines, the best thing that is ever worked for her is Imitrex injections, we have tried multiple alternative medications including analgesics, Nurtec, Ubrelvy, Reyvow, and multiple other for second and third line acute management medications.  Patient had stroke, at that time we took her off her Imitrex, today she implore only request to have it back, she understands the risks, we did review the risks and discussed that sometimes life is about weighing the benefits versus those risks, at this time I will prescribe them for her, she is fully aware of the risk that she is taking with a history of vascular disease. ? ?Interval history 02/12/2020: Migraines fluctuate with her eye problems, she sees cornea specialists and retina specialists and they may install long term steroid release long term in the eyes. Las botox she had only 4 migraine days a month taken care of by imitrex, significant.  >70% improvement in frequency of migraines, Baseline 15 severemigraine days a month and daily headaches. No clenching. ? ?She absolutely loved  .Dry needling: cervical myofascial pain, Send back to dry needling - [address] for cervical myofascial pain syndrome. Gave samples for Nurtec for her to try as well acutely at last visit but she did  not like it. ? ?Tried: VPA, Topamax, antidepressants, norvasc, phenergan, frova, aimovig, ajovy, fioricet, (save 6 As), verapamil ? ? ? ? ?Consent Form ?Botulism Toxin Injection For Chronic Migraine ? ? ? ?Reviewed orally with patient, additionally signature is on file: ? ?Botulism toxin has been approved by the Federal drug administration for treatment of chronic migraine. Botulism toxin does not cure chronic migraine and it may not be effective in some patients. ? ?The administration of botulism toxin is accomplished by injecting a small amount of toxin into the muscles of the neck and head. Dosage must be titrated for each individual. Any benefits resulting from botulism toxin tend to wear off after 3 months with a repeat injection required if benefit is to be maintained. Injections are usually done every 3-4 months with maximum effect peak achieved by about 2 or 3 weeks. Botulism toxin is expensive and you should be sure of what costs you will incur resulting from the injection. ? ?The side effects of botulism toxin use for chronic migraine may include: ? ? -Transient, and usually mild, facial weakness with facial injections ? -Transient, and usually mild, head or neck weakness with head/neck injections ? -Reduction or loss of forehead facial animation due to forehead muscle weakness ? -Eyelid drooping ? -Dry eye ? -Pain at the site of injection or bruising at the site of injection ? -Double vision ? -Potential unknown long term risks ? ?Contraindications: You should not have Botox if you are pregnant, nursing, allergic to albumin, have an infection, skin condition, or muscle weakness at the site of the injection, or have myasthenia gravis, Lambert-Eaton syndrome, or ALS. ? ?It is also possible that as with any injection,  there may be an allergic reaction or no effect from the medication. Reduced effectiveness after repeated injections is sometimes seen and rarely infection at the injection site may occur. All  care will be taken to prevent these side effects. If therapy is given over a long time, atrophy and wasting in the muscle injected may occur. Occasionally the patient's become refractory to treatment because they develop antibodies to the toxin. In this event, therapy needs to be modified. ? ?I have read the above information and consent to the administration of botulism toxin. ? ? ? ?BOTOX PROCEDURE NOTE FOR MIGRAINE HEADACHE ? ? ? ?Contraindications and precautions discussed with patient(above). Aseptic procedure was observed and patient tolerated procedure. Procedure performed by Dr. Artemio Alyoni Elanor Cale ? ?The condition has existed for more than 6 months, and pt does not have a diagnosis of ALS, Myasthenia Gravis or Lambert-Eaton Syndrome.  Risks and benefits of injections discussed and pt agrees to proceed with the procedure.  Written consent obtained ? ?These injections are medically necessary. Pt  receives good benefits from these injections. These injections do not cause sedations or hallucinations which the oral therapies may cause. ? ?Description of procedure: ? ?The patient was placed in a sitting position. The standard protocol was used for Botox as follows, with 5 units of Botox injected at each site: ? ? ?-Procerus muscle, midline injection DID NOT PERFORM ? ?-Corrugator muscle, bilateral injection DID NOT PERFORM ? ?-Frontalis muscle, bilateral injection, with 2 sites each side, medial injection was performed in the upper one third of the frontalis muscle, in the region vertical from the medial inferior edge of the superior orbital rim. The lateral injection was again in the upper one third of the forehead vertically above the lateral limbus of the cornea, 1.5 cm lateral to the medial injection site. ? ?-Temporalis muscle injection, 4 sites, bilaterally. The first injection was 3 cm above the tragus of the ear, second injection site was 1.5 cm to 3 cm up from the first injection site in line with the tragus of  the ear. The third injection site was 1.5-3 cm forward between the first 2 injection sites. The fourth injection site was 1.5 cm posterior to the second injection site.  ? ?-Occipitalis muscle injection, 3 sites, bilaterally. The first injection was done one half way between the occipital protuberance and the tip of the mastoid process behind the ear. The second injection site was done lateral and superior to the first, 1 fingerbreadth from the first injection. The third injection site was 1 fingerbreadth superiorly and medially from the first injection site. ? ?-Cervical paraspinal muscle injection, 2 sites, bilateral knee first injection site was 1 cm from the midline of the cervical spine, 3 cm inferior to the lower border of the occipital protuberance. The second injection site was 1.5 cm superiorly and laterally to the first injection site. ? ?-Trapezius muscle injection was performed at 3 sites, bilaterally. The first injection site was in the upper trapezius muscle halfway between the inflection point of the neck, and the acromion. The second injection site was one half way between the acromion and the first injection site. The third injection was done between the first injection site and the inflection point of the neck. ? ? ?Will return for repeat injection in 3 months. ? ? ?140 units of Botox was used, 60U Botox not injected was wasted. The patient tolerated the procedure well, there were no complications of the above procedure. ? ? ? ? ? ?

## 2021-12-07 MED ORDER — FORTEO 600 MCG/2.4ML ~~LOC~~ SOPN: 20.0000 ug | PEN_INJECTOR | Freq: Every day | SUBCUTANEOUS | 0 refills | Status: DC

## 2021-12-07 NOTE — Telephone Encounter (Addendum)
Patient received Forteo last week and pen needles were picked up yesterday. She plans to start Forteo tonight. She did not want to start today since she is volunteering. Recommended she inject at night before bedtime) that way risk of falls from dizziness is minimized.  Review injection administration technique over the phone. She verbalized understanding.  Knox Saliva, PharmD, MPH, BCPS, CPP Clinical Pharmacist (Rheumatology and Pulmonology)

## 2021-12-07 NOTE — Addendum Note (Signed)
Addended by: Murrell ReddenGAJERA, Kameran Lallier S on: 12/07/2021 12:14 PM   Modules accepted: Orders

## 2021-12-18 DIAGNOSIS — M79645 Pain in left finger(s): Secondary | ICD-10-CM | POA: Diagnosis not present

## 2021-12-18 DIAGNOSIS — M1812 Unilateral primary osteoarthritis of first carpometacarpal joint, left hand: Secondary | ICD-10-CM | POA: Insufficient documentation

## 2021-12-25 ENCOUNTER — Other Ambulatory Visit: Payer: Self-pay | Admitting: Neurology

## 2021-12-30 DIAGNOSIS — M79645 Pain in left finger(s): Secondary | ICD-10-CM | POA: Diagnosis not present

## 2022-01-13 ENCOUNTER — Other Ambulatory Visit: Payer: Self-pay | Admitting: Internal Medicine

## 2022-01-13 DIAGNOSIS — M65312 Trigger thumb, left thumb: Secondary | ICD-10-CM | POA: Diagnosis not present

## 2022-01-13 DIAGNOSIS — J8410 Pulmonary fibrosis, unspecified: Secondary | ICD-10-CM

## 2022-01-13 DIAGNOSIS — M1812 Unilateral primary osteoarthritis of first carpometacarpal joint, left hand: Secondary | ICD-10-CM | POA: Diagnosis not present

## 2022-01-13 NOTE — Progress Notes (Deleted)
Subjective:     Patient ID: Brianna [name], female   DOB: Jul 09, 1951,     MRN: [id number]       Brief patient profile:  71 yowf  quit smoking age 71 with no resp problems then @  71 bad pneumonia.  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   No sign doe but not that active.   Sleeping ok without nocturnal  or early am exac of resp c/o's or need for noct saba.  rec Ok to restart boniva the first of September and every month but if your respiratory or reflux symptoms worsen it's first medication I would stop and take in it's place Reclast IV yearly.    11/13/2013 f/u ov/Brianna [name] re:  PF assoc with RA Chief Complaint  Patient presents with   Followup with PFT    Pt states that her breathing is unchanged since her last visit. No new co's today.    can now do 1st to 2nd floor s sob and working out regularly s limits due to sob rec  Return in one year for pfts and cxr and call sooner if not decline in tolerance > did not return       04/22/2017   Pulmonary office visit/ Brianna [name]  / re-establish re RA assoc ILD  Chief Complaint  Patient presents with   Pulmonary Consult    Self referral for cough x 3-4 months. She is coughing up white to yellow sputum.    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  rec Augmentin 875 mg take one pill twice daily  X 10 days - take at breakfast and supper with large glass of water.  It would help reduce the usual side effects (diarrhea and yeast infections) if you ate cultured yogurt at lunch.  Prednisone 10 mg take  4 each am x 2 days,   2 each am x 2 days,  1 each am x 2 days and stop  Stop fosfamax for now  Pantoprazole (protonix) 40 mg   Take  30-60 min before first meal of the day and Pepcid (famotidine)  20 mg one @  bedtime until return to office - this is the best way to tell whether stomach acid is contributing to your problem.   GERD diet     06/14/2017  f/u ov/Brianna [name] re:  uacs ? Aggravated by fosfamax  Chief Complaint  Patient presents with   Follow-up    PFT done today. Her cough is much improved. No new co's. She wants to discuss alternative med for bones since we stopped the fosamax.  cough still present somedays p  stirring in am / no pm cough at all which is quite an improvement s need for cough suppression Rates herself at 90% better  rec Restart fosmax 2 weeks before visit (= 2 doses ) prior to your visit with your rheumatologist Risk of reflux goes up on fosfamax so ideally I would recommend a substitute either Reclast or Prolia if feasible but defer the final call on this issue to your rheumatologist     06/22/2019  f/u ov/Brianna [name] re: ? RA lung dz  Chief Complaint  Patient presents with   Follow-up    PFT done today. Increased dry cough in the am.    Dyspnea:  Not limited by breathing from desired activities  But not doing aerobics due to gym Cough: throat clearing assoc with nasal congestion x 3 m/ has not tried otcs Sleeping: 20 degrees HOB up  SABA use: none  02: none  rec Make sure you check your oxygen saturations at highest level of activity  Try zyrtec 10 mg at bedtime  (24 hour pill) to helps nasal symptoms if not consider ENT evaluation> did not use  as risk of dry eyes Add:  Cough flared off ppi ac so add back if not better on zyrtec    06/23/2020  f/u ov/Brianna [name] re: ?  RA lung dz  Chief Complaint  Patient presents with   Follow-up    Pulm fibrosis, Cough, still having dry cough, occ yellow mucus.Chest tightness with exertion  Dyspnea:  Walking the dog at brisk pace /  predictable R anterior cp when walks briskly up incline  Cough: throat clearing better in afternoon/ min yellow mucus/  Sleeping: 20-30 degrees  SABA use: none 02: none  rec GERD  Diet  Try allegra or clariton over  the counter to see if helps your sensation of post nasal drainage  Make sure you check your oxygen saturations at highest level of activity   Please remember to go to the lab department /ct sinus   - Allergy profile 06/23/2020 >  Eos 0.5/  IgE  59 - Sinus CT 07/04/2020 >>> Clear paranasal sinuses. Mild proximal narrowing of the bilateral ostiomeatal units secondary to mucosal thickening         09/30/2020  f/u ov/Brianna [name] re: pnds not able to take any antihistamines  Chief Complaint  Patient presents with   Follow-up    Cough is unchanged since there last visit. She has prod cough with yellow to clear sputum in the mornings.   Dyspnea: walking dog brisk pace/ up hills no more cps / no desats when checks but this is usually p ex  Cough: worse in am's x 3 years / can't take any inhistamines / assoc with pnds Sleeping: 30 degrees electric bed  comfortable SABA use: none 02: none  Covid status:   vax x 3  Rec Flonase twice daily each nostril  If not better :  I emphasized that nasal steroids(flonase)  have no immediate benefit in terms of improving symptoms If still not better > see you ENT next and let him know about your CT sinus Make sure you check your oxygen saturation at your highest level of activity to be sure it stays over 90%    09/22/2021  f/u ov/Brianna [name] re: PF / RA   maint on no resp rx   No chief complaint on file.  Dyspnea:  not walking at much  due to L knee  Cough: assoc with pnds / Sleeping: electric bed x 30  degrees  SABA use: none  02: none  Rec Make sure you check your oxygen saturation at your highest level of activity to be sure it stays over 90%    Try Mucinex D  over the counter - you have to sign for it and follow up with ENT as planned   Please schedule a follow up visit in 3 months but call sooner if needed with PFTs same day     01/14/2022  f/u ov/Brianna [name] re: ***   maint on ***  No chief complaint on file.   Dyspnea:  *** Cough: *** Sleeping: *** SABA use: *** 02: *** Covid status:   ***   No obvious day to day or daytime variability or assoc excess/ purulent sputum or mucus plugs or hemoptysis or cp or chest tightness, subjective wheeze or overt sinus or hb symptoms.   *** without nocturnal  or early am exacerbation  of respiratory  c/o's or need for noct saba. Also denies any obvious fluctuation of symptoms with weather or environmental changes or other aggravating or alleviating factors except as outlined above   No unusual exposure hx or h/o childhood pna/ asthma or knowledge of premature birth.  Current Allergies, Complete Past Medical History, Past Surgical History, Family History, and Social History were reviewed in [employer] record.  ROS  The following are not active complaints unless bolded Hoarseness, sore throat, dysphagia, dental problems, itching, sneezing,  nasal congestion or discharge of excess mucus or purulent secretions, ear ache,   fever, chills, sweats, unintended wt loss or wt gain, classically pleuritic or exertional cp,  orthopnea pnd or arm/hand swelling  or leg swelling, presyncope, palpitations, abdominal pain, anorexia, nausea, vomiting, diarrhea  or change in bowel habits or change in bladder habits, change in stools or change in urine, dysuria, hematuria,  rash, arthralgias, visual complaints, headache, numbness, weakness or ataxia or problems with walking or  coordination,  change in mood or  memory.        No outpatient medications have been marked as taking for the 01/14/22 encounter (Appointment) with Nyoka Cowden, MD.   Current Facility-Administered Medications for the 01/14/22 encounter (Appointment) with Nyoka Cowden, MD  Medication   0.9 %  sodium chloride infusion                       Objective:   Physical Exam   wts   01/14/2022     ***  09/22/2021      113  09/30/2020    118 06/23/2020    122  06/22/2019  128  06/21/2018    126   Wt 130 01/14/2011 > 08/27/2013  126 >  11/13/2013 125 > 04/22/2017  125 > 06/14/2017   127 > 10/11/2017  124 >    Vital signs reviewed  01/14/2022  - Note at rest 02 sats  ***% on ***   General appearance:    ***    ery min crackles in bases  bilaterally  ***    Assessment:

## 2022-01-14 ENCOUNTER — Ambulatory Visit: Payer: Medicare PPO | Admitting: Internal Medicine

## 2022-02-09 DIAGNOSIS — Z7983 Long term (current) use of bisphosphonates: Secondary | ICD-10-CM | POA: Diagnosis not present

## 2022-02-09 DIAGNOSIS — G40909 Epilepsy, unspecified, not intractable, without status epilepticus: Secondary | ICD-10-CM | POA: Diagnosis not present

## 2022-02-09 DIAGNOSIS — M816 Localized osteoporosis [Lequesne]: Secondary | ICD-10-CM | POA: Diagnosis not present

## 2022-02-09 DIAGNOSIS — N952 Postmenopausal atrophic vaginitis: Secondary | ICD-10-CM | POA: Diagnosis not present

## 2022-02-09 DIAGNOSIS — Z6821 Body mass index (BMI) 21.0-21.9, adult: Secondary | ICD-10-CM | POA: Diagnosis not present

## 2022-02-09 DIAGNOSIS — N958 Other specified menopausal and perimenopausal disorders: Secondary | ICD-10-CM | POA: Diagnosis not present

## 2022-02-09 DIAGNOSIS — Z01419 Encounter for gynecological examination (general) (routine) without abnormal findings: Secondary | ICD-10-CM | POA: Diagnosis not present

## 2022-02-09 DIAGNOSIS — Z1231 Encounter for screening mammogram for malignant neoplasm of breast: Secondary | ICD-10-CM | POA: Diagnosis not present

## 2022-02-11 DIAGNOSIS — H401123 Primary open-angle glaucoma, left eye, severe stage: Secondary | ICD-10-CM | POA: Diagnosis not present

## 2022-02-11 DIAGNOSIS — H401112 Primary open-angle glaucoma, right eye, moderate stage: Secondary | ICD-10-CM | POA: Diagnosis not present

## 2022-02-16 ENCOUNTER — Telehealth: Payer: Self-pay | Admitting: *Deleted

## 2022-02-16 DIAGNOSIS — Z947 Corneal transplant status: Secondary | ICD-10-CM | POA: Diagnosis not present

## 2022-02-16 DIAGNOSIS — Z9842 Cataract extraction status, left eye: Secondary | ICD-10-CM | POA: Diagnosis not present

## 2022-02-16 DIAGNOSIS — H04123 Dry eye syndrome of bilateral lacrimal glands: Secondary | ICD-10-CM | POA: Diagnosis not present

## 2022-02-16 DIAGNOSIS — Z9841 Cataract extraction status, right eye: Secondary | ICD-10-CM | POA: Diagnosis not present

## 2022-02-16 DIAGNOSIS — H18601 Keratoconus, unspecified, right eye: Secondary | ICD-10-CM | POA: Diagnosis not present

## 2022-02-16 DIAGNOSIS — H401112 Primary open-angle glaucoma, right eye, moderate stage: Secondary | ICD-10-CM | POA: Diagnosis not present

## 2022-02-16 DIAGNOSIS — Z961 Presence of intraocular lens: Secondary | ICD-10-CM | POA: Diagnosis not present

## 2022-02-16 DIAGNOSIS — H3581 Retinal edema: Secondary | ICD-10-CM | POA: Diagnosis not present

## 2022-02-16 DIAGNOSIS — T868412 Corneal transplant failure, left eye: Secondary | ICD-10-CM | POA: Diagnosis not present

## 2022-02-16 DIAGNOSIS — H353221 Exudative age-related macular degeneration, left eye, with active choroidal neovascularization: Secondary | ICD-10-CM | POA: Diagnosis not present

## 2022-02-16 DIAGNOSIS — Z8669 Personal history of other diseases of the nervous system and sense organs: Secondary | ICD-10-CM | POA: Diagnosis not present

## 2022-02-16 DIAGNOSIS — Z79899 Other long term (current) drug therapy: Secondary | ICD-10-CM | POA: Diagnosis not present

## 2022-02-16 DIAGNOSIS — H30033 Focal chorioretinal inflammation, peripheral, bilateral: Secondary | ICD-10-CM | POA: Diagnosis not present

## 2022-02-16 DIAGNOSIS — H401123 Primary open-angle glaucoma, left eye, severe stage: Secondary | ICD-10-CM | POA: Diagnosis not present

## 2022-02-16 NOTE — Telephone Encounter (Signed)
Received DEXA results from [facility].  Date of Scan: 02/09/2022   Lowest T-score:-3.4  BMD:0.489  Lowest site measured:1/3 distal Forearm  DX: Osteoporosis   Significant changes in BMD and site measured (5% and above):n/a  Current Regimen:Calcium Vitamin D and Forteo started May 2023.   Recommendation:Patient remains in the Osteoporosis range. We will discuss results in detail upcoming follow up 03/10/2022  Reviewed by:Sherron Ales, PA-C  Next Appointment:  03/10/2022

## 2022-02-17 DIAGNOSIS — H6123 Impacted cerumen, bilateral: Secondary | ICD-10-CM | POA: Diagnosis not present

## 2022-02-25 ENCOUNTER — Telehealth: Payer: Self-pay | Admitting: Neurology

## 2022-02-25 NOTE — Progress Notes (Unsigned)
Office Visit Note  [name]             Date of Birth: 08/10/1950           MRN: [id number]             PCP: Irena Reichmann, DO Referring: Irena Reichmann, DO Visit Date: 03/10/2022 Occupation: @  Subjective:  Right hip pain   History of Present Illness: JARIKA ROBBEN is a 71 y.o. female with history of seropositive rheumatoid arthritis, iritis, and ILD.  Patient remains on Humira 40 mg sq injections every 14 days prescribed by Dr. Sherryll Burger and arava 20 mg by mouth daily.  She denies any signs or symptoms of a rheumatoid arthritis flare.  She presents today with increased pain in the right hip on the lateral aspect especially when lying on her sides at night.  She said her symptoms initially started in November 2022 which she initially attributed to gait changes since she was favoring her left knee at the time.  She has difficulty standing or sitting for prolonged periods of time due to the discomfort.  She denies any groin pain.  She denies any recent injury or fall.  She denies any other joint pain or joint swelling at this time.  She states that next Wednesday she will be undergoing corneal surgery.  She states that her last dose of Humira was this past Monday.  She denies any pain in her left eye currently.  She remains under the care of Dr. Sherryll Burger.   She denies any new or worsening pulmonary symptoms.  She has an upcoming appointment with Dr. Sherene Sires in September 2023.  She will be having updated PFTs at that time. Patient has been on Forteo daily injections since May 2023.  She has been tolerating Forteo without any side effects.  She had updated bone density on 02/09/2022 and would like to review results today.  She is taking calcium and vitamin D supplement as recommended.  She denies any recent falls or fractures.    Activities of Daily Living:  Patient reports morning stiffness for 0 minutes.   Patient Reports nocturnal pain.  Difficulty dressing/grooming:  Denies Difficulty climbing stairs: Denies Difficulty getting out of chair: Denies Difficulty using hands for taps, buttons, cutlery, and/or writing: Reports  Review of Systems  Constitutional:  Positive for fatigue.  HENT:  Positive for mouth dryness. Negative for mouth sores.   Eyes:  Positive for dryness.  Respiratory:  Negative for shortness of breath.   Cardiovascular:  Negative for chest pain and palpitations.  Gastrointestinal:  Negative for blood in stool, constipation and diarrhea.  Endocrine: Negative for increased urination.  Genitourinary:  Negative for involuntary urination.  Musculoskeletal:  Positive for joint pain and joint pain. Negative for gait problem, joint swelling, myalgias, muscle weakness, morning stiffness, muscle tenderness and myalgias.  Skin:  Positive for color change. Negative for rash, hair loss and sensitivity to sunlight.  Allergic/Immunologic: Negative for susceptible to infections.  Neurological:  Negative for dizziness and headaches.  Hematological:  Negative for swollen glands.  Psychiatric/Behavioral:  Negative for depressed mood and sleep disturbance. The patient is not nervous/anxious.     PMFS History:  Patient Active Problem List   Diagnosis Date Noted   Coronary artery disease involving native coronary artery of native heart without angina pectoris 11/23/2021   Rhinitis, chronic 09/23/2021   Episodic cluster headache, not intractable 03/24/2021   Morning headache 03/24/2021   Cerebrovascular accident (CVA) (HCC) 03/24/2021  Chronic migraine without aura, intractable, without status migrainosus 03/24/2021   Patellar fracture 02/11/2021   Dyslipidemia 02/11/2021   Anxiety 08/28/2020   Carotid artery disease (HCC) 08/28/2020   Late effects of cerebrovascular disease 08/28/2020   Macular degeneration 08/28/2020   Other long term (current) drug therapy 08/28/2020   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   Pure hypercholesterolemia  08/28/2020   Vitamin D deficiency 08/28/2020   Exertional chest pain 06/24/2020   Hypertension 05/12/2020   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   Acute urinary tract infection 11/13/2019   Epilepsy (HCC) 11/13/2019   Corneal ulcer of left eye 12/05/2018   Acute left eye pain 12/05/2018   Endophthalmitis, acute, left 12/05/2018   URI, acute 05/19/2018   Cerumen impaction 05/19/2018   Laceration of left middle finger without foreign body without damage to nail 11/23/2017   Trigger thumb, right thumb 10/05/2017   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   Numbness 04/25/2017   Pain 04/25/2017   History of macular degeneration 09/13/2016   History of migraine 09/06/2016   History of seizures 09/06/2016   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   Secondary corneal edema of right eye 06/28/2016   Iritis of right eye 05/12/2016   Rheumatoid arthritis with positive rheumatoid factor (HCC) 05/11/2016   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   High risk medication use 05/11/2016   Osteoporosis 05/11/2016   Intractable chronic migraine without aura and without status migrainosus 04/02/2016   Peripheral focal chorioretinal inflammation of both eyes 01/04/2016   Squamous blepharitis of upper and lower eyelids of both eyes 10/22/2015   Dry eyes, bilateral 10/21/2015   History of high risk medication treatment 04/28/2015   Cystoid macular edema of left eye 09/23/2014   History of carotid endarterectomy 06/27/2014   Acquired myogenic ptosis of both eyelids 06/07/2014   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   Migraine without aura 04/19/2013   Seizures (HCC) 04/19/2013   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   Keratoconus, 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   Minor corneal opacity 08/02/2011   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   Cough 01/14/2011   Pulmonary fibrosis (HCC) 01/14/2011    Past Medical History:  Diagnosis Date   Arthritis    in the neck   Carotid artery occlusion    Epilepsy (HCC)    GERD  (gastroesophageal reflux disease)    Glaucoma    Hyperlipidemia    Hypertension    Keratoconus of both eyes 1981   Macular degeneration    Migraines    Peripheral vascular disease (HCC)    carotid occlusion surgery on left   Raynaud's disease    Retinal edema    Rheumatoid arthritis(714.0)    Stroke (HCC)    two mild strokes presumed from left carotid stenosis    Family History  Problem Relation Age of Onset   Heart disease Mother    Lung disease Mother        ? disease process   Uterine cancer Mother    Heart disease Father    Clotting disorder Father    Collagen disease Father    Cervical cancer Maternal Aunt    Prostate cancer Maternal Grandfather    Rheum arthritis Sister    High Cholesterol Sister    Epilepsy Sister    Rheum arthritis Maternal Grandmother    High Cholesterol Brother    High blood pressure Brother    High blood pressure Brother    High Cholesterol Brother    High blood pressure Brother    High Cholesterol Brother  Past Surgical History:  Procedure Laterality Date   CAROTID ARTERY ANGIOPLASTY  2008   Dr Madilyn Fireman   CAROTID ENDARTERECTOMY Left 11-15-2007   cataract extraction Right 04-2014   [facility]   CHOLECYSTECTOMY  1995   CORNEAL TRANSPLANT     x 5 ; steroid inj. 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   [facility]   CORNEAL TRANSPLANT Left 04/17/2019   EYE SURGERY Right 12/2016   cornea repair    EYE SURGERY Right 09/20/2017   cornea transplant    GLAUCOMA SURGERY Right 2018   ORIF PATELLA Left 02/12/2021   Procedure: OPEN REDUCTION INTERNAL (ORIF) FIXATION PATELLA;  Surgeon: Yolonda Kida, MD;  Location: [facility] OR;  Service: Orthopedics;  Laterality: Left;   TRIGGER FINGER RELEASE Right 05/16/2018   Procedure: RELEASE TRIGGER FINGER/A-1 PULLEY RIGHT THUMB;  Surgeon: Cindee Salt, MD;  Location: [facility];  Service: Orthopedics;  Laterality: Right;   Social History   Social History Narrative    Patient is married to [name]), has 2 children   Patient is right handed   Education level is Bachelor's degree   Caffeine consumption is about 2 cups daily   Lives at home with husband    Immunization History  Administered Date(s) Administered   Influenza Split 04/26/2013, 04/08/2017   Influenza, High Dose Seasonal PF 04/09/2018   Influenza, Quadrivalent, Recombinant, Inj, Pf 04/21/2017, 03/19/2019   Influenza-Unspecified 04/19/2016   PFIZER(Purple Top)SARS-COV-2 Vaccination 08/24/2019, 09/18/2019, 05/12/2020, 10/29/2020   Pneumococcal Conjugate-13 08/27/2013, 07/05/2017   Pneumococcal Polysaccharide-23 01/14/2011   Tdap 11/15/2017   Zoster Recombinat (Shingrix) 07/19/2019, 11/28/2019     Objective: Vital Signs: BP (!) 144/78 (BP Location: Left Arm, Patient Position: Sitting, Cuff Size: Normal)   Pulse 73   Resp 14   Ht  (1.575 m)   Wt 109 lb 6.4 oz (49.6 kg)   BMI 20.01 kg/m    Physical Exam Vitals and nursing note reviewed.  Constitutional:      Appearance: She is well-developed.  HENT:     Head: Normocephalic and atraumatic.  Eyes:     Conjunctiva/sclera: Conjunctivae normal.  Cardiovascular:     Rate and Rhythm: Normal rate and regular rhythm.     Heart sounds: Normal heart sounds.  Pulmonary:     Effort: Pulmonary effort is normal.     Breath sounds: Normal breath sounds.  Abdominal:     General: Bowel sounds are normal.     Palpations: Abdomen is soft.  Musculoskeletal:     Cervical back: Normal range of motion.  Skin:    General: Skin is warm and dry.     Capillary Refill: Capillary refill takes less than 2 seconds.  Neurological:     Mental Status: She is alert and oriented to person, place, and time.  Psychiatric:        Behavior: Behavior normal.      Musculoskeletal Exam: C-spine, thoracic spine, lumbar spine have good range of motion.  No midline spinal tenderness or SI joint tenderness currently.  Shoulder joints have good range of motion  with no discomfort.  Elbow joints, wrist joints, MCPs, PIPs, DIPs have good range of motion with no synovitis.  She was able to make a complete fist bilaterally.  Hip joints have good range of motion with no groin pain.  Tenderness over the right trochanteric bursa.  Knee joints have good range of motion with no warmth or effusion.  Ankle joints have good range of motion  with no tenderness or joint swelling.  CDAI Exam: CDAI Score: 0.6  Patient Global: 3 mm; Provider Global: 3 mm Swollen: 0 ; Tender: 0  Joint Exam 03/10/2022   No joint exam has been documented for this visit   There is currently no information documented on the homunculus. Go to the Rheumatology activity and complete the homunculus joint exam.  Investigation: No additional findings.  Imaging: No results found.  Recent Labs: Lab Results  Component Value Date   WBC 11.6 (H) 02/14/2021   HGB 11.4 (L) 02/14/2021   PLT 191 02/14/2021   NA 140 03/18/2021   K 3.8 03/18/2021   CL 98 03/18/2021   CO2 29 03/18/2021   GLUCOSE 96 03/18/2021   BUN 11 03/18/2021   CREATININE 0.60 03/18/2021   BILITOT 0.2 03/18/2021   ALKPHOS 104 03/18/2021   AST 24 03/18/2021   ALT 16 03/18/2021   PROT 7.0 03/18/2021   ALBUMIN 4.3 03/18/2021   CALCIUM 8.9 03/18/2021   GFRAA 107 05/26/2020   QFTBGOLDPLUS NEGATIVE 05/31/2018    Speciality Comments: Last Reclast 11/30/2017   Procedures:  No procedures performed Allergies: Morphine, Morphine and related, Alphagan [brimonidine], Codeine, Dorzolamide hcl-timolol mal, Hydrocodone-acetaminophen, Sulfa antibiotics, and Sulfamethoxazole      Assessment / Plan:     Visit Diagnoses: Rheumatoid arthritis involving multiple sites with positive rheumatoid factor (HCC): She has no joint tenderness or synovitis on examination today.  She has not had any signs or symptoms of a rheumatoid arthritis flare.  She has clinically been doing well on Humira 40 mg sq injections every 14 days and Arava 20  mg 1 tablet daily.  She is tolerating combination therapy without any side effects.  She has not had any recent or recurrent infections.  She will remain on Humira and Arava as combination therapy prescribed by Dr. Sherryll Burger.  She will notify us if she develops increased joint pain or joint swelling.  She will follow-up in the office in 5 months or sooner if needed.  High risk medication use - Arava 20 mg 1 tablet by mouth daily and Humira 40 mg sq every 14 days prescribed by Dr. Sherryll Burger.  - Plan: CBC with Differential/Platelet, COMPLETE METABOLIC PANEL WITH GFR CBC and CMP updated on 11/18/2021.  Orders for CBC and CMP were released today.  Her next lab work will be due in November and every 3 months to monitor for drug toxicity.  TB gold negative on 07/07/2021 and will continue to be monitored yearly. She has not had any recent or recurrent infections.  Discussed the importance of holding Humira if she develops signs or symptoms of infection and to resume once the infection has completely cleared. She plans on scheduling a dermatology appointment for routine follow-up for skin cancer screening while on Humira.  ILD (interstitial lung disease) (HCC) - She is followed by Dr. Sherene Sires.  Chest x-ray 09/22/2021 stable fibrotic changes.  No acute infiltrate.  She has an upcoming appointment with Dr. Sherene Sires on 04/05/2022 and will be having updated PFTs at that time. Minimal crackles at lung bases noted today. She is not experiencing any new or worsening pulmonary symptoms currently.  Iritis of right eye - Followed by Dr.Shah, Dr. Dan Humphreys, and Dr. Lottie Dawson. She is on Humira 40 mg subcutaneous injections every 14 days, which is prescribed by Dr. Sherryll Burger.  Patient is scheduled for penetrating keratoplasty on 03/17/22 with Dr. Shela Commons.    Trochanteric bursitis, right hip: She presents today with discomfort consistent with trochanter bursitis  of the right hip.  Her symptoms initially started in November 2022.  She has not had any recent  fall or injury prior to the onset of symptoms.  Her discomfort is exacerbated by lying on her right side at night as well as walking or sitting for prolonged periods of time.  She is not experiencing any groin pain currently.  On examination she has good range of motion of both hip joints with no groin pain.  She has tenderness over the right trochanteric bursa.  Different treatment options were discussed today in detail.  She was given a handout of home exercises to perform.  If her symptoms do not improve she would like a referral to physical therapy in the future.  If she continues to have symptoms refractory to treatment a cortisone injection could be administered in the future.  DDD (degenerative disc disease), lumbar: No midline spinal tenderness currently.  No symptoms of radiculopathy at this time.  Age-related osteoporosis without current pathological fracture - DEXA on 11/13/19: L Left femoral neck T-score -3.0, Right FN T-score -2.6, distal 1/2 left radius T-score -3.3. Updated DEXA on 02/09/2022: AP spine normal, left femoral neck T score -3.2, right femoral neck T score -2.8, left third forearm T score -3.4.  Discussed DEXA results in detail with the patient today in the office. Previously d/c fosasamx due to GERD.  Patient was started on Forteo daily injections in May 2023.  She has been tolerating Forteo without any side effects or injection site reactions.  She continues to take a calcium and vitamin D supplement as recommended.  She has not had any recent falls or fractures. Discussed the importance of performing resistive exercises.  She plans on using resistive bands as well as very light weights. She will remain on Forteo as prescribed.  Her next bone density will be due in July 2025.  Other medical conditions are listed as follows:  History of carotid endarterectomy  History of seizures  History of glaucoma  History of migraine  History of macular  degeneration  Orders: Orders Placed This Encounter  Procedures   CBC with Differential/Platelet   COMPLETE METABOLIC PANEL WITH GFR   No orders of the defined types were placed in this encounter.    Follow-Up Instructions: Return in about 5 months (around 08/10/2022) for Rheumatoid arthritis, ILD.   Gearldine Bienenstock, PA-C  Note - This record has been created using Dragon software.  Chart creation errors have been sought, but may not always  have been located. Such creation errors do not reflect on  the standard of medical care.

## 2022-02-25 NOTE — Telephone Encounter (Signed)
Pt is calling and would like a return call to re-schedule Botox appointment

## 2022-02-25 NOTE — Telephone Encounter (Signed)
Brianna [name] will you call her please

## 2022-03-02 ENCOUNTER — Ambulatory Visit: Payer: Medicare PPO | Admitting: Neurology

## 2022-03-10 ENCOUNTER — Ambulatory Visit: Payer: Medicare PPO | Attending: Physician Assistant | Admitting: Physician Assistant

## 2022-03-10 ENCOUNTER — Encounter: Payer: Self-pay | Admitting: Physician Assistant

## 2022-03-10 VITALS — BP 144/78 | HR 73 | Resp 14 | Ht 62.0 in | Wt 109.4 lb

## 2022-03-10 DIAGNOSIS — M7061 Trochanteric bursitis, right hip: Secondary | ICD-10-CM | POA: Diagnosis not present

## 2022-03-10 DIAGNOSIS — H209 Unspecified iridocyclitis: Secondary | ICD-10-CM | POA: Diagnosis not present

## 2022-03-10 DIAGNOSIS — M810 Age-related osteoporosis without current pathological fracture: Secondary | ICD-10-CM

## 2022-03-10 DIAGNOSIS — Z9889 Other specified postprocedural states: Secondary | ICD-10-CM | POA: Diagnosis not present

## 2022-03-10 DIAGNOSIS — M0579 Rheumatoid arthritis with rheumatoid factor of multiple sites without organ or systems involvement: Secondary | ICD-10-CM

## 2022-03-10 DIAGNOSIS — M51369 Other intervertebral disc degeneration, lumbar region without mention of lumbar back pain or lower extremity pain: Secondary | ICD-10-CM

## 2022-03-10 DIAGNOSIS — M5136 Other intervertebral disc degeneration, lumbar region: Secondary | ICD-10-CM

## 2022-03-10 DIAGNOSIS — Z87898 Personal history of other specified conditions: Secondary | ICD-10-CM

## 2022-03-10 DIAGNOSIS — J849 Interstitial pulmonary disease, unspecified: Secondary | ICD-10-CM

## 2022-03-10 DIAGNOSIS — Z79899 Other long term (current) drug therapy: Secondary | ICD-10-CM | POA: Diagnosis not present

## 2022-03-10 DIAGNOSIS — Z8669 Personal history of other diseases of the nervous system and sense organs: Secondary | ICD-10-CM

## 2022-03-10 DIAGNOSIS — G8929 Other chronic pain: Secondary | ICD-10-CM

## 2022-03-10 NOTE — Progress Notes (Signed)
Absolute eosinophils are borderline elevated. Rest of CBC WNL.

## 2022-03-10 NOTE — Patient Instructions (Addendum)
Voltaren gel   Hip Bursitis Rehab Ask your health care provider which exercises are safe for you. Do exercises exactly as told by your health care provider and adjust them as directed. It is normal to feel mild stretching, pulling, tightness, or discomfort as you do these exercises. Stop right away if you feel sudden pain or your pain gets worse. Do not begin these exercises until told by your health care provider. Stretching exercise This exercise warms up your muscles and joints and improves the movement and flexibility of your hip. This exercise also helps to relieve pain and stiffness. Iliotibial band stretch An iliotibial band is a strong band of muscle tissue that runs from the outer side of your hip to the outer side of your thigh and knee. Lie on your side with your left / right leg in the top position. Bend your left / right knee and grab your ankle. Stretch out your bottom arm to help you balance. Slowly bring your knee back so your thigh is slightly behind your body. Slowly lower your knee toward the floor until you feel a gentle stretch on the outside of your left / right thigh. If you do not feel a stretch and your knee will not lower more toward the floor, place the heel of your other foot on top of your knee and pull your knee down toward the floor with your foot. Hold this position for __________ seconds. Slowly return to the starting position. Repeat __________ times. Complete this exercise __________ times a day. Strengthening exercises These exercises build strength and endurance in your hip and pelvis. Endurance is the ability to use your muscles for a long time, even after they get tired. Bridge This exercise strengthens the muscles that move your thigh backward (hip extensors). Lie on your back on a firm surface with your knees bent and your feet flat on the floor. Tighten your buttocks muscles and lift your buttocks off the floor until your trunk is level with your  thighs. Do not arch your back. You should feel the muscles working in your buttocks and the back of your thighs. If you do not feel these muscles, slide your feet 1-2 inches (2.5-5 cm) farther away from your buttocks. If this exercise is too easy, try doing it with your arms crossed over your chest. Hold this position for __________ seconds. Slowly lower your hips to the starting position. Let your muscles relax completely after each repetition. Repeat __________ times. Complete this exercise __________ times a day. Squats This exercise strengthens the muscles in front of your thigh and knee (quadriceps). Stand in front of a table, with your feet and knees pointing straight ahead. You may rest your hands on the table for balance but not for support. Slowly bend your knees and lower your hips like you are going to sit in a chair. Keep your weight over your heels, not over your toes. Keep your lower legs upright so they are parallel with the table legs. Do not let your hips go lower than your knees. Do not bend lower than told by your health care provider. If your hip pain increases, do not bend as low. Hold the squat position for __________ seconds. Slowly push with your legs to return to standing. Do not use your hands to pull yourself to standing. Repeat __________ times. Complete this exercise __________ times a day. Hip hike  Stand sideways on a bottom step. Stand on your left / right leg with your other  foot unsupported next to the step. You can hold on to the railing or wall for balance if needed. Keep your knees straight and your torso square. Then lift your left / right hip up toward the ceiling. Hold this position for __________ seconds. Slowly let your left / right hip lower toward the floor, past the starting position. Your foot should get closer to the floor. Do not lean or bend your knees. Repeat __________ times. Complete this exercise __________ times a day. Single leg  stand This exercise increases your balance. Without shoes, stand near a railing or in a doorway. You may hold on to the railing or door frame as needed for balance. Squeeze your left / right buttock muscles, then lift up your other foot. Do not let your left / right hip push out to the side. It is helpful to stand in front of a mirror for this exercise so you can watch your hip. Hold this position for __________ seconds. Repeat __________ times. Complete this exercise __________ times a day. This information is not intended to replace advice given to you by your health care provider. Make sure you discuss any questions you have with your health care provider. Iliotibial Band Syndrome Rehab Ask your health care provider which exercises are safe for you. Do exercises exactly as told by your health care provider and adjust them as directed. It is normal to feel mild stretching, pulling, tightness, or discomfort as you do these exercises. Stop right away if you feel sudden pain or your pain gets significantly worse. Do not begin these exercises until told by your health care provider. Stretching and range-of-motion exercises These exercises warm up your muscles and joints and improve the movement and flexibility of your hip and pelvis. Quadriceps stretch, prone  Lie on your abdomen (prone position) on a firm surface, such as a bed or padded floor. Bend your left / right knee and reach back to hold your ankle or pant leg. If you cannot reach your ankle or pant leg, loop a belt around your foot and grab the belt instead. Gently pull your heel toward your buttocks. Your knee should not slide out to the side. You should feel a stretch in the front of your thigh and knee (quadriceps). Hold this position for __________ seconds. Repeat __________ times. Complete this exercise __________ times a day. Iliotibial band stretch An iliotibial band is a strong band of muscle tissue that runs from the outer side  of your hip to the outer side of your thigh and knee. Lie on your side with your left / right leg in the top position. Bend both of your knees and grab your left / right ankle. Stretch out your bottom arm to help you balance. Slowly bring your top knee back so your thigh goes behind your trunk. Slowly lower your top leg toward the floor until you feel a gentle stretch on the outside of your left / right hip and thigh. If you do not feel a stretch and your knee will not fall farther, place the heel of your other foot on top of your knee and pull your knee down toward the floor with your foot. Hold this position for __________ seconds. Repeat __________ times. Complete this exercise __________ times a day. Strengthening exercises These exercises build strength and endurance in your hip and pelvis. Endurance is the ability to use your muscles for a long time, even after they get tired. Straight leg raises, side-lying This exercise strengthens  the muscles that rotate the leg at the hip and move it away from your body (hip abductors). Lie on your side with your left / right leg in the top position. Lie so your head, shoulder, hip, and knee line up. You may bend your bottom knee to help you balance. Roll your hips slightly forward so your hips are stacked directly over each other and your left / right knee is facing forward. Tense the muscles in your outer thigh and lift your top leg 4-6 inches (10-15 cm). Hold this position for __________ seconds. Slowly lower your leg to return to the starting position. Let your muscles relax completely before doing another repetition. Repeat __________ times. Complete this exercise __________ times a day. Leg raises, prone This exercise strengthens the muscles that move the hips backward (hip extensors). Lie on your abdomen (prone position) on your bed or a firm surface. You can put a pillow under your hips if that is more comfortable for your lower back. Bend your  left / right knee so your foot is straight up in the air. Squeeze your buttocks muscles and lift your left / right thigh off the bed. Do not let your back arch. Tense your thigh muscle as hard as you can without increasing any knee pain. Hold this position for __________ seconds. Slowly lower your leg to return to the starting position and allow it to relax completely. Repeat __________ times. Complete this exercise __________ times a day. Hip hike Stand sideways on a bottom step. Stand on your left / right leg with your other foot unsupported next to the step. You can hold on to a railing or wall for balance if needed. Keep your knees straight and your torso square. Then lift your left / right hip up toward the ceiling. Slowly let your left / right hip lower toward the floor, past the starting position. Your foot should get closer to the floor. Do not lean or bend your knees. Repeat __________ times. Complete this exercise __________ times a day. This information is not intended to replace advice given to you by your health care provider. Make sure you discuss any questions you have with your health care provider. Document Revised: 09/12/2019 Document Reviewed: 09/12/2019 Elsevier Patient Education  2023 [employer].

## 2022-03-11 ENCOUNTER — Telehealth: Payer: Self-pay | Admitting: Cardiology

## 2022-03-11 LAB — CBC WITH DIFFERENTIAL/PLATELET
Absolute Monocytes: 845 cells/uL (ref 200–950)
Basophils Absolute: 79 cells/uL (ref 0–200)
Basophils Relative: 1 %
Eosinophils Absolute: 553 cells/uL — ABNORMAL HIGH (ref 15–500)
Eosinophils Relative: 7 %
HCT: 39.9 % (ref 35.0–45.0)
Hemoglobin: 13 g/dL (ref 11.7–15.5)
Lymphs Abs: 1256 cells/uL (ref 850–3900)
MCH: 27.3 pg (ref 27.0–33.0)
MCHC: 32.6 g/dL (ref 32.0–36.0)
MCV: 83.8 fL (ref 80.0–100.0)
MPV: 10.4 fL (ref 7.5–12.5)
Monocytes Relative: 10.7 %
Neutro Abs: 5167 cells/uL (ref 1500–7800)
Neutrophils Relative %: 65.4 %
Platelets: 312 10*3/uL (ref 140–400)
RBC: 4.76 10*6/uL (ref 3.80–5.10)
RDW: 14.6 % (ref 11.0–15.0)
Total Lymphocyte: 15.9 %
WBC: 7.9 10*3/uL (ref 3.8–10.8)

## 2022-03-11 LAB — COMPLETE METABOLIC PANEL WITH GFR
AG Ratio: 1.2 (calc) (ref 1.0–2.5)
ALT: 22 U/L (ref 6–29)
AST: 37 U/L — ABNORMAL HIGH (ref 10–35)
Albumin: 4.3 g/dL (ref 3.6–5.1)
Alkaline phosphatase (APISO): 166 U/L — ABNORMAL HIGH (ref 37–153)
BUN: 11 mg/dL (ref 7–25)
CO2: 26 mmol/L (ref 20–32)
Calcium: 9.4 mg/dL (ref 8.6–10.4)
Chloride: 99 mmol/L (ref 98–110)
Creat: 0.69 mg/dL (ref 0.50–1.05)
Globulin: 3.6 g/dL (calc) (ref 1.9–3.7)
Glucose, Bld: 99 mg/dL (ref 65–99)
Potassium: 4.6 mmol/L (ref 3.5–5.3)
Sodium: 139 mmol/L (ref 135–146)
Total Bilirubin: 0.5 mg/dL (ref 0.2–1.2)
Total Protein: 7.9 g/dL (ref 6.1–8.1)
eGFR: 94 mL/min/{1.73_m2} (ref >=60)

## 2022-03-11 NOTE — Telephone Encounter (Signed)
Pt c/o medication issue:  1. Name of Medication: amLODipine (NORVASC) 10 MG tablet  2. How are you currently taking this medication (dosage and times per day)? As prescribed   3. Are you having a reaction (difficulty breathing--STAT)? Yes  4. What is your medication issue? Pt is calling stating that this medication seems to have caused a hardening of her gums according to her dentist. She is requesting a call back to discuss this.

## 2022-03-11 NOTE — Progress Notes (Signed)
Alk phos is elevated-166. Please see if isoenzymes can be added. AST is borderline elevated-37. ALT WNL.  Avoid the use of tylenol and alcohol use.

## 2022-03-11 NOTE — Telephone Encounter (Signed)
Spoke with patient, discussed Dr. Anne Fu' reply: Stop the amlodipine.  Thanks for the update  Brianna Schultz, MD   Patient asked if Dr. Anne Fu recommends taking another medication instead of Amlodipine to control BP.  Patient reports BP from another OV yesterday 144/78.  Will forward to Dr. Anne Fu to advise.

## 2022-03-11 NOTE — Telephone Encounter (Signed)
Pt aware Dr. Anne Fu and his nurse are not in the office this week. Aware someone will call her next week w/ advisement. She is agreeable to plan.

## 2022-03-12 NOTE — Telephone Encounter (Signed)
Spoke with patient and shared Dr. Anne Fu' reply: Monitor BP  Can message in readings after 2 weeks.  Brianna Schultz, MD  Patient verbalized understanding.

## 2022-03-16 ENCOUNTER — Encounter: Payer: Self-pay | Admitting: Neurology

## 2022-03-16 ENCOUNTER — Ambulatory Visit: Payer: Medicare PPO | Admitting: Neurology

## 2022-03-16 DIAGNOSIS — G43711 Chronic migraine without aura, intractable, with status migrainosus: Secondary | ICD-10-CM

## 2022-03-16 MED ORDER — NURTEC 75 MG PO TBDP: 75.0000 mg | ORAL_TABLET | Freq: Every day | ORAL | 0 refills | Status: DC | PRN

## 2022-03-16 MED ORDER — ONABOTULINUMTOXINA 200 UNITS IJ SOLR
155.0000 [IU] | Freq: Once | INTRAMUSCULAR | Status: AC
  Administered 2022-03-16: 155 [IU] via INTRAMUSCULAR

## 2022-03-16 NOTE — Patient Instructions (Signed)
Rimegepant Disintegrating Tablets What is this medication? RIMEGEPANT (ri ME je pant) prevents and treats migraines. It works by blocking a substance in the body that causes migraines. This medicine may be used for other purposes; ask your health care provider or pharmacist if you have questions. COMMON BRAND NAME(S): NURTEC ODT What should I tell my care team before I take this medication? They need to know if you have any of these conditions: Kidney disease Liver disease An unusual or allergic reaction to rimegepant, other medications, foods, dyes, or preservatives Pregnant or trying to get pregnant Breast-feeding How should I use this medication? Take this medication by mouth. Take it as directed on the prescription label. Leave the tablet in the sealed pack until you are ready to take it. With dry hands, open the pack and gently remove the tablet. If the tablet breaks or crumbles, throw it away. Use a new tablet. Place the tablet in the mouth and allow it to dissolve. Then, swallow it. Do not cut, crush, or chew this medication. You do not need water to take this medication. Talk to your care team about the use of this medication in children. Special care may be needed. Overdosage: If you think you have taken too much of this medicine contact a poison control center or emergency room at once. NOTE: This medicine is only for you. Do not share this medicine with others. What if I miss a dose? This does not apply. This medication is not for regular use. What may interact with this medication? Certain medications for fungal infections, such as fluconazole, itraconazole Rifampin This list may not describe all possible interactions. Give your health care provider a list of all the medicines, herbs, non-prescription drugs, or dietary supplements you use. Also tell them if you smoke, drink alcohol, or use illegal drugs. Some items may interact with your medicine. What should I watch for while  using this medication? Visit your care team for regular checks on your progress. Tell your care team if your symptoms do not start to get better or if they get worse. What side effects may I notice from receiving this medication? Side effects that you should report to your care team as soon as possible: Allergic reactions--skin rash, itching, hives, swelling of the face, lips, tongue, or throat Side effects that usually do not require medical attention (report to your care team if they continue or are bothersome): Nausea Stomach pain This list may not describe all possible side effects. Call your doctor for medical advice about side effects. You may report side effects to FDA at 1-800-FDA-1088. Where should I keep my medication? Keep out of the reach of children and pets. Store at room temperature between 20 and 25 degrees C (68 and 77 degrees F). Get rid of any unused medication after the expiration date. To get rid of medications that are no longer needed or have expired: Take the medication to a medication take-back program. Check with your pharmacy or law enforcement to find a location. If you cannot return the medication, check the label or package insert to see if the medication should be thrown out in the garbage or flushed down the toilet. If you are not sure, ask your care team. If it is safe to put it in the trash, take the medication out of the container. Mix the medication with cat litter, dirt, coffee grounds, or other unwanted substance. Seal the mixture in a bag or container. Put it in the trash. NOTE: This   sheet is a summary. It may not cover all possible information. If you have questions about this medicine, talk to your doctor, pharmacist, or health care provider.  2023 Elsevier/Gold Standard (2021-08-26 00:00:00)  

## 2022-03-16 NOTE — Progress Notes (Signed)
Botox- 200 units x 1 vial Lot: [id number] Expiration: 08/2024 NDC: 0023-3921-02  Bacteriostatic 0.9% Sodium Chloride- 4mL total Lot: [id number] Expiration: 02/17/2023 NDC: 0409-1966-02  Dx: G43.711  B/B  

## 2022-03-16 NOTE — Progress Notes (Signed)
03/16/2022: stable; >70% improvement in frequency of migraines, Baseline 15 severemigraine days a month and daily headaches. No corrugators or procerus. Very high frontalis. Gave nurtec samples.   12/01/2021: stable  09/08/2021: stable  Interval history 06/09/2021: stable. No corrugators or procerus. Very high frontalis.  Broke her patella in a wheelchair now.>70% improvement in frequency of migraines, Baseline 15 severemigraine days a month and daily headaches.  Interval history 03/10/2021: stable. No corrugators or procerus. Very high frontalis. Rest in traps. Broke her patella in a wheelchair now.  Interval history 12/01/2020: stable  Send to Dr. Vickey Huger, intractable headches, morning headaches: Intractable headaches, wakes with headaches, has headaches when she wakes in the morning, hx of strokes: sleep evaluation  Imitrex injections: Patient has intractable chronic migraines, the best thing that is ever worked for her is Imitrex injections, we have tried multiple alternative medications including analgesics, Nurtec, Ubrelvy, Reyvow, and multiple other for second and third line acute management medications.  Patient had stroke, at that time we took her off her Imitrex, today she implore only request to have it back, she understands the risks, we did review the risks and discussed that sometimes life is about weighing the benefits versus those risks, at this time I will prescribe them for her, she is fully aware of the risk that she is taking with a history of vascular disease.  Interval history 02/12/2020: Migraines fluctuate with her eye problems, she sees cornea specialists and retina specialists and they may install long term steroid release long term in the eyes. Las botox she had only 4 migraine days a month taken care of by imitrex, significant.  >70% improvement in frequency of migraines, Baseline 15 severemigraine days a month and daily headaches. No clenching.  She absolutely loved  .Dry  needling: cervical myofascial pain, Send back to dry needling - [address] for cervical myofascial pain syndrome. Gave samples for Nurtec for her to try as well acutely at last visit but she did not like it.  Tried: VPA, Topamax, antidepressants, norvasc, phenergan, frova, aimovig, ajovy, fioricet, (save 6 As), verapamil     Consent Form Botulism Toxin Injection For Chronic Migraine    Reviewed orally with patient, additionally signature is on file:  Botulism toxin has been approved by the Federal drug administration for treatment of chronic migraine. Botulism toxin does not cure chronic migraine and it may not be effective in some patients.  The administration of botulism toxin is accomplished by injecting a small amount of toxin into the muscles of the neck and head. Dosage must be titrated for each individual. Any benefits resulting from botulism toxin tend to wear off after 3 months with a repeat injection required if benefit is to be maintained. Injections are usually done every 3-4 months with maximum effect peak achieved by about 2 or 3 weeks. Botulism toxin is expensive and you should be sure of what costs you will incur resulting from the injection.  The side effects of botulism toxin use for chronic migraine may include:   -Transient, and usually mild, facial weakness with facial injections  -Transient, and usually mild, head or neck weakness with head/neck injections  -Reduction or loss of forehead facial animation due to forehead muscle weakness  -Eyelid drooping  -Dry eye  -Pain at the site of injection or bruising at the site of injection  -Double vision  -Potential unknown long term risks  Contraindications: You should not have Botox if you are pregnant, nursing, allergic to albumin, have an infection,  skin condition, or muscle weakness at the site of the injection, or have myasthenia gravis, Lambert-Eaton syndrome, or ALS.  It is also possible that as with any  injection, there may be an allergic reaction or no effect from the medication. Reduced effectiveness after repeated injections is sometimes seen and rarely infection at the injection site may occur. All care will be taken to prevent these side effects. If therapy is given over a long time, atrophy and wasting in the muscle injected may occur. Occasionally the patient's become refractory to treatment because they develop antibodies to the toxin. In this event, therapy needs to be modified.  I have read the above information and consent to the administration of botulism toxin.    BOTOX PROCEDURE NOTE FOR MIGRAINE HEADACHE    Contraindications and precautions discussed with patient(above). Aseptic procedure was observed and patient tolerated procedure. Procedure performed by Dr. Artemio Aly  The condition has existed for more than 6 months, and pt does not have a diagnosis of ALS, Myasthenia Gravis or Lambert-Eaton Syndrome.  Risks and benefits of injections discussed and pt agrees to proceed with the procedure.  Written consent obtained  These injections are medically necessary. Pt  receives good benefits from these injections. These injections do not cause sedations or hallucinations which the oral therapies may cause.  Description of procedure:  The patient was placed in a sitting position. The standard protocol was used for Botox as follows, with 5 units of Botox injected at each site:   -Procerus muscle, midline injection DID NOT PERFORM  -Corrugator muscle, bilateral injection DID NOT PERFORM  -Frontalis muscle, bilateral injection, with 2 sites each side, medial injection was performed in the upper one third of the frontalis muscle, in the region vertical from the medial inferior edge of the superior orbital rim. The lateral injection was again in the upper one third of the forehead vertically above the lateral limbus of the cornea, 1.5 cm lateral to the medial injection  site.  -Temporalis muscle injection, 4 sites, bilaterally. The first injection was 3 cm above the tragus of the ear, second injection site was 1.5 cm to 3 cm up from the first injection site in line with the tragus of the ear. The third injection site was 1.5-3 cm forward between the first 2 injection sites. The fourth injection site was 1.5 cm posterior to the second injection site.   -Occipitalis muscle injection, 3 sites, bilaterally. The first injection was done one half way between the occipital protuberance and the tip of the mastoid process behind the ear. The second injection site was done lateral and superior to the first, 1 fingerbreadth from the first injection. The third injection site was 1 fingerbreadth superiorly and medially from the first injection site.  -Cervical paraspinal muscle injection, 2 sites, bilateral knee first injection site was 1 cm from the midline of the cervical spine, 3 cm inferior to the lower border of the occipital protuberance. The second injection site was 1.5 cm superiorly and laterally to the first injection site.  -Trapezius muscle injection was performed at 3 sites, bilaterally. The first injection site was in the upper trapezius muscle halfway between the inflection point of the neck, and the acromion. The second injection site was one half way between the acromion and the first injection site. The third injection was done between the first injection site and the inflection point of the neck.   Will return for repeat injection in 3 months.   140 units of  Botox was used, 60U Botox not injected was wasted. The patient tolerated the procedure well, there were no complications of the above procedure.

## 2022-03-17 DIAGNOSIS — T868412 Corneal transplant failure, left eye: Secondary | ICD-10-CM | POA: Diagnosis not present

## 2022-03-17 DIAGNOSIS — J8410 Pulmonary fibrosis, unspecified: Secondary | ICD-10-CM | POA: Diagnosis not present

## 2022-03-17 DIAGNOSIS — M069 Rheumatoid arthritis, unspecified: Secondary | ICD-10-CM | POA: Diagnosis not present

## 2022-03-17 DIAGNOSIS — I10 Essential (primary) hypertension: Secondary | ICD-10-CM | POA: Diagnosis not present

## 2022-03-17 DIAGNOSIS — G43009 Migraine without aura, not intractable, without status migrainosus: Secondary | ICD-10-CM | POA: Diagnosis not present

## 2022-03-17 DIAGNOSIS — K219 Gastro-esophageal reflux disease without esophagitis: Secondary | ICD-10-CM | POA: Diagnosis not present

## 2022-03-17 DIAGNOSIS — F419 Anxiety disorder, unspecified: Secondary | ICD-10-CM | POA: Diagnosis not present

## 2022-03-17 DIAGNOSIS — G40909 Epilepsy, unspecified, not intractable, without status epilepticus: Secondary | ICD-10-CM | POA: Diagnosis not present

## 2022-03-17 DIAGNOSIS — H1812 Bullous keratopathy, left eye: Secondary | ICD-10-CM | POA: Diagnosis not present

## 2022-03-17 DIAGNOSIS — E785 Hyperlipidemia, unspecified: Secondary | ICD-10-CM | POA: Diagnosis not present

## 2022-03-18 DIAGNOSIS — Z947 Corneal transplant status: Secondary | ICD-10-CM | POA: Diagnosis not present

## 2022-03-18 DIAGNOSIS — H353221 Exudative age-related macular degeneration, left eye, with active choroidal neovascularization: Secondary | ICD-10-CM | POA: Diagnosis not present

## 2022-03-18 DIAGNOSIS — H401123 Primary open-angle glaucoma, left eye, severe stage: Secondary | ICD-10-CM | POA: Diagnosis not present

## 2022-03-18 DIAGNOSIS — Z79899 Other long term (current) drug therapy: Secondary | ICD-10-CM | POA: Diagnosis not present

## 2022-03-18 DIAGNOSIS — H30033 Focal chorioretinal inflammation, peripheral, bilateral: Secondary | ICD-10-CM | POA: Diagnosis not present

## 2022-03-18 DIAGNOSIS — H18601 Keratoconus, unspecified, right eye: Secondary | ICD-10-CM | POA: Diagnosis not present

## 2022-03-18 DIAGNOSIS — H401112 Primary open-angle glaucoma, right eye, moderate stage: Secondary | ICD-10-CM | POA: Diagnosis not present

## 2022-03-18 DIAGNOSIS — Z48298 Encounter for aftercare following other organ transplant: Secondary | ICD-10-CM | POA: Diagnosis not present

## 2022-03-18 DIAGNOSIS — H04123 Dry eye syndrome of bilateral lacrimal glands: Secondary | ICD-10-CM | POA: Diagnosis not present

## 2022-03-24 DIAGNOSIS — R432 Parageusia: Secondary | ICD-10-CM | POA: Diagnosis not present

## 2022-03-24 DIAGNOSIS — K219 Gastro-esophageal reflux disease without esophagitis: Secondary | ICD-10-CM | POA: Insufficient documentation

## 2022-03-25 DIAGNOSIS — H30033 Focal chorioretinal inflammation, peripheral, bilateral: Secondary | ICD-10-CM | POA: Diagnosis not present

## 2022-03-25 DIAGNOSIS — Z79899 Other long term (current) drug therapy: Secondary | ICD-10-CM | POA: Diagnosis not present

## 2022-03-25 DIAGNOSIS — Z48810 Encounter for surgical aftercare following surgery on the sense organs: Secondary | ICD-10-CM | POA: Diagnosis not present

## 2022-03-25 DIAGNOSIS — H401112 Primary open-angle glaucoma, right eye, moderate stage: Secondary | ICD-10-CM | POA: Diagnosis not present

## 2022-03-25 DIAGNOSIS — H353221 Exudative age-related macular degeneration, left eye, with active choroidal neovascularization: Secondary | ICD-10-CM | POA: Diagnosis not present

## 2022-03-25 DIAGNOSIS — H401123 Primary open-angle glaucoma, left eye, severe stage: Secondary | ICD-10-CM | POA: Diagnosis not present

## 2022-03-25 DIAGNOSIS — Z947 Corneal transplant status: Secondary | ICD-10-CM | POA: Diagnosis not present

## 2022-03-25 DIAGNOSIS — H18601 Keratoconus, unspecified, right eye: Secondary | ICD-10-CM | POA: Diagnosis not present

## 2022-03-25 DIAGNOSIS — H3581 Retinal edema: Secondary | ICD-10-CM | POA: Diagnosis not present

## 2022-04-01 DIAGNOSIS — H401112 Primary open-angle glaucoma, right eye, moderate stage: Secondary | ICD-10-CM | POA: Diagnosis not present

## 2022-04-01 DIAGNOSIS — Z961 Presence of intraocular lens: Secondary | ICD-10-CM | POA: Diagnosis not present

## 2022-04-01 DIAGNOSIS — H04123 Dry eye syndrome of bilateral lacrimal glands: Secondary | ICD-10-CM | POA: Diagnosis not present

## 2022-04-01 DIAGNOSIS — H30033 Focal chorioretinal inflammation, peripheral, bilateral: Secondary | ICD-10-CM | POA: Diagnosis not present

## 2022-04-01 DIAGNOSIS — H353221 Exudative age-related macular degeneration, left eye, with active choroidal neovascularization: Secondary | ICD-10-CM | POA: Diagnosis not present

## 2022-04-01 DIAGNOSIS — Z48298 Encounter for aftercare following other organ transplant: Secondary | ICD-10-CM | POA: Diagnosis not present

## 2022-04-01 DIAGNOSIS — Z947 Corneal transplant status: Secondary | ICD-10-CM | POA: Diagnosis not present

## 2022-04-01 DIAGNOSIS — H18892 Other specified disorders of cornea, left eye: Secondary | ICD-10-CM | POA: Diagnosis not present

## 2022-04-01 DIAGNOSIS — H401123 Primary open-angle glaucoma, left eye, severe stage: Secondary | ICD-10-CM | POA: Diagnosis not present

## 2022-04-05 ENCOUNTER — Other Ambulatory Visit: Payer: Self-pay | Admitting: *Deleted

## 2022-04-05 ENCOUNTER — Ambulatory Visit: Payer: Medicare PPO | Admitting: Internal Medicine

## 2022-04-05 DIAGNOSIS — I10 Essential (primary) hypertension: Secondary | ICD-10-CM

## 2022-04-05 MED ORDER — SPIRONOLACTONE 25 MG PO TABS: 12.5000 mg | ORAL_TABLET | Freq: Every day | ORAL | 3 refills | Status: DC

## 2022-04-08 DIAGNOSIS — Z947 Corneal transplant status: Secondary | ICD-10-CM | POA: Diagnosis not present

## 2022-04-08 DIAGNOSIS — Z79899 Other long term (current) drug therapy: Secondary | ICD-10-CM | POA: Diagnosis not present

## 2022-04-08 DIAGNOSIS — Z48298 Encounter for aftercare following other organ transplant: Secondary | ICD-10-CM | POA: Diagnosis not present

## 2022-04-08 DIAGNOSIS — H04123 Dry eye syndrome of bilateral lacrimal glands: Secondary | ICD-10-CM | POA: Diagnosis not present

## 2022-04-08 DIAGNOSIS — H401130 Primary open-angle glaucoma, bilateral, stage unspecified: Secondary | ICD-10-CM | POA: Diagnosis not present

## 2022-04-13 DIAGNOSIS — H353221 Exudative age-related macular degeneration, left eye, with active choroidal neovascularization: Secondary | ICD-10-CM | POA: Diagnosis not present

## 2022-04-13 DIAGNOSIS — H30102 Unspecified disseminated chorioretinal inflammation, left eye: Secondary | ICD-10-CM | POA: Diagnosis not present

## 2022-04-13 DIAGNOSIS — Z79899 Other long term (current) drug therapy: Secondary | ICD-10-CM | POA: Diagnosis not present

## 2022-04-13 DIAGNOSIS — H3581 Retinal edema: Secondary | ICD-10-CM | POA: Diagnosis not present

## 2022-04-13 DIAGNOSIS — H401123 Primary open-angle glaucoma, left eye, severe stage: Secondary | ICD-10-CM | POA: Diagnosis not present

## 2022-04-13 DIAGNOSIS — H30033 Focal chorioretinal inflammation, peripheral, bilateral: Secondary | ICD-10-CM | POA: Diagnosis not present

## 2022-04-13 DIAGNOSIS — H401112 Primary open-angle glaucoma, right eye, moderate stage: Secondary | ICD-10-CM | POA: Diagnosis not present

## 2022-04-13 DIAGNOSIS — Z947 Corneal transplant status: Secondary | ICD-10-CM | POA: Diagnosis not present

## 2022-04-14 DIAGNOSIS — Z79899 Other long term (current) drug therapy: Secondary | ICD-10-CM | POA: Diagnosis not present

## 2022-04-14 DIAGNOSIS — H30033 Focal chorioretinal inflammation, peripheral, bilateral: Secondary | ICD-10-CM | POA: Diagnosis not present

## 2022-04-15 DIAGNOSIS — H353221 Exudative age-related macular degeneration, left eye, with active choroidal neovascularization: Secondary | ICD-10-CM | POA: Diagnosis not present

## 2022-04-15 DIAGNOSIS — Z9889 Other specified postprocedural states: Secondary | ICD-10-CM | POA: Diagnosis not present

## 2022-04-15 DIAGNOSIS — H401112 Primary open-angle glaucoma, right eye, moderate stage: Secondary | ICD-10-CM | POA: Diagnosis not present

## 2022-04-15 DIAGNOSIS — H401123 Primary open-angle glaucoma, left eye, severe stage: Secondary | ICD-10-CM | POA: Diagnosis not present

## 2022-04-15 DIAGNOSIS — H30033 Focal chorioretinal inflammation, peripheral, bilateral: Secondary | ICD-10-CM | POA: Diagnosis not present

## 2022-04-15 DIAGNOSIS — Z961 Presence of intraocular lens: Secondary | ICD-10-CM | POA: Diagnosis not present

## 2022-04-15 DIAGNOSIS — Z947 Corneal transplant status: Secondary | ICD-10-CM | POA: Diagnosis not present

## 2022-04-15 DIAGNOSIS — Z48810 Encounter for surgical aftercare following surgery on the sense organs: Secondary | ICD-10-CM | POA: Diagnosis not present

## 2022-04-15 DIAGNOSIS — H04123 Dry eye syndrome of bilateral lacrimal glands: Secondary | ICD-10-CM | POA: Diagnosis not present

## 2022-04-21 ENCOUNTER — Telehealth: Payer: Self-pay | Admitting: *Deleted

## 2022-04-21 NOTE — Telephone Encounter (Signed)
Botox Approved 01/01/203-07/18/2022 Approval Dates 07/19/2021/07/18/2022 Authorization   # [id number]  [employer]

## 2022-04-23 DIAGNOSIS — H401123 Primary open-angle glaucoma, left eye, severe stage: Secondary | ICD-10-CM | POA: Diagnosis not present

## 2022-04-23 DIAGNOSIS — H401112 Primary open-angle glaucoma, right eye, moderate stage: Secondary | ICD-10-CM | POA: Diagnosis not present

## 2022-04-23 DIAGNOSIS — H0288B Meibomian gland dysfunction left eye, upper and lower eyelids: Secondary | ICD-10-CM | POA: Diagnosis not present

## 2022-04-23 DIAGNOSIS — H30033 Focal chorioretinal inflammation, peripheral, bilateral: Secondary | ICD-10-CM | POA: Diagnosis not present

## 2022-04-23 DIAGNOSIS — Z947 Corneal transplant status: Secondary | ICD-10-CM | POA: Diagnosis not present

## 2022-04-23 DIAGNOSIS — H353221 Exudative age-related macular degeneration, left eye, with active choroidal neovascularization: Secondary | ICD-10-CM | POA: Diagnosis not present

## 2022-04-23 DIAGNOSIS — Z9889 Other specified postprocedural states: Secondary | ICD-10-CM | POA: Diagnosis not present

## 2022-04-23 DIAGNOSIS — H04123 Dry eye syndrome of bilateral lacrimal glands: Secondary | ICD-10-CM | POA: Diagnosis not present

## 2022-04-23 DIAGNOSIS — H0288A Meibomian gland dysfunction right eye, upper and lower eyelids: Secondary | ICD-10-CM | POA: Diagnosis not present

## 2022-04-30 DIAGNOSIS — H04123 Dry eye syndrome of bilateral lacrimal glands: Secondary | ICD-10-CM | POA: Diagnosis not present

## 2022-04-30 DIAGNOSIS — H401123 Primary open-angle glaucoma, left eye, severe stage: Secondary | ICD-10-CM | POA: Diagnosis not present

## 2022-04-30 DIAGNOSIS — H0288B Meibomian gland dysfunction left eye, upper and lower eyelids: Secondary | ICD-10-CM | POA: Diagnosis not present

## 2022-04-30 DIAGNOSIS — H30033 Focal chorioretinal inflammation, peripheral, bilateral: Secondary | ICD-10-CM | POA: Diagnosis not present

## 2022-04-30 DIAGNOSIS — Z09 Encounter for follow-up examination after completed treatment for conditions other than malignant neoplasm: Secondary | ICD-10-CM | POA: Diagnosis not present

## 2022-04-30 DIAGNOSIS — Z885 Allergy status to narcotic agent status: Secondary | ICD-10-CM | POA: Diagnosis not present

## 2022-04-30 DIAGNOSIS — H353221 Exudative age-related macular degeneration, left eye, with active choroidal neovascularization: Secondary | ICD-10-CM | POA: Diagnosis not present

## 2022-04-30 DIAGNOSIS — H401112 Primary open-angle glaucoma, right eye, moderate stage: Secondary | ICD-10-CM | POA: Diagnosis not present

## 2022-04-30 DIAGNOSIS — H0288A Meibomian gland dysfunction right eye, upper and lower eyelids: Secondary | ICD-10-CM | POA: Diagnosis not present

## 2022-05-06 DIAGNOSIS — Z48810 Encounter for surgical aftercare following surgery on the sense organs: Secondary | ICD-10-CM | POA: Diagnosis not present

## 2022-05-06 DIAGNOSIS — H04123 Dry eye syndrome of bilateral lacrimal glands: Secondary | ICD-10-CM | POA: Diagnosis not present

## 2022-05-06 DIAGNOSIS — Z9889 Other specified postprocedural states: Secondary | ICD-10-CM | POA: Diagnosis not present

## 2022-05-06 DIAGNOSIS — Z961 Presence of intraocular lens: Secondary | ICD-10-CM | POA: Diagnosis not present

## 2022-05-06 DIAGNOSIS — Z79899 Other long term (current) drug therapy: Secondary | ICD-10-CM | POA: Diagnosis not present

## 2022-05-06 DIAGNOSIS — Z947 Corneal transplant status: Secondary | ICD-10-CM | POA: Diagnosis not present

## 2022-05-06 DIAGNOSIS — H18601 Keratoconus, unspecified, right eye: Secondary | ICD-10-CM | POA: Diagnosis not present

## 2022-05-06 DIAGNOSIS — H353221 Exudative age-related macular degeneration, left eye, with active choroidal neovascularization: Secondary | ICD-10-CM | POA: Diagnosis not present

## 2022-05-06 DIAGNOSIS — H30033 Focal chorioretinal inflammation, peripheral, bilateral: Secondary | ICD-10-CM | POA: Diagnosis not present

## 2022-05-13 DIAGNOSIS — H18601 Keratoconus, unspecified, right eye: Secondary | ICD-10-CM | POA: Diagnosis not present

## 2022-05-13 DIAGNOSIS — H18892 Other specified disorders of cornea, left eye: Secondary | ICD-10-CM | POA: Diagnosis not present

## 2022-05-13 DIAGNOSIS — Z48810 Encounter for surgical aftercare following surgery on the sense organs: Secondary | ICD-10-CM | POA: Diagnosis not present

## 2022-05-13 DIAGNOSIS — H401123 Primary open-angle glaucoma, left eye, severe stage: Secondary | ICD-10-CM | POA: Diagnosis not present

## 2022-05-13 DIAGNOSIS — H30033 Focal chorioretinal inflammation, peripheral, bilateral: Secondary | ICD-10-CM | POA: Diagnosis not present

## 2022-05-13 DIAGNOSIS — Z9889 Other specified postprocedural states: Secondary | ICD-10-CM | POA: Diagnosis not present

## 2022-05-13 DIAGNOSIS — Z947 Corneal transplant status: Secondary | ICD-10-CM | POA: Diagnosis not present

## 2022-05-13 DIAGNOSIS — H353221 Exudative age-related macular degeneration, left eye, with active choroidal neovascularization: Secondary | ICD-10-CM | POA: Diagnosis not present

## 2022-05-13 DIAGNOSIS — H04123 Dry eye syndrome of bilateral lacrimal glands: Secondary | ICD-10-CM | POA: Diagnosis not present

## 2022-05-13 DIAGNOSIS — Z961 Presence of intraocular lens: Secondary | ICD-10-CM | POA: Diagnosis not present

## 2022-05-20 ENCOUNTER — Ambulatory Visit: Payer: Medicare PPO | Admitting: Internal Medicine

## 2022-05-20 ENCOUNTER — Encounter: Payer: Self-pay | Admitting: Internal Medicine

## 2022-05-20 ENCOUNTER — Ambulatory Visit (INDEPENDENT_AMBULATORY_CARE_PROVIDER_SITE_OTHER): Payer: Medicare PPO | Admitting: Internal Medicine

## 2022-05-20 VITALS — BP 124/88 | HR 88 | Ht 62.0 in | Wt 115.0 lb

## 2022-05-20 DIAGNOSIS — J8410 Pulmonary fibrosis, unspecified: Secondary | ICD-10-CM

## 2022-05-20 DIAGNOSIS — R053 Chronic cough: Secondary | ICD-10-CM

## 2022-05-20 LAB — PULMONARY FUNCTION TEST
DL/VA % pred: 94 %
DL/VA: 3.97 ml/min/mmHg/L
DLCO cor % pred: 64 %
DLCO cor: 11.83 ml/min/mmHg
DLCO unc % pred: 63 %
DLCO unc: 11.53 ml/min/mmHg
FEF 25-75 Post: 1.58 L/sec
FEF 25-75 Pre: 1.34 L/sec
FEF2575-%Change-Post: 17 %
FEF2575-%Pred-Post: 86 %
FEF2575-%Pred-Pre: 73 %
FEV1-%Change-Post: 4 %
FEV1-%Pred-Post: 68 %
FEV1-%Pred-Pre: 65 %
FEV1-Post: 1.44 L
FEV1-Pre: 1.38 L
FEV1FVC-%Change-Post: 3 %
FEV1FVC-%Pred-Pre: 107 %
FEV6-%Change-Post: 0 %
FEV6-%Pred-Post: 63 %
FEV6-%Pred-Pre: 63 %
FEV6-Post: 1.69 L
FEV6-Pre: 1.68 L
FEV6FVC-%Pred-Post: 104 %
FEV6FVC-%Pred-Pre: 104 %
FVC-%Change-Post: 0 %
FVC-%Pred-Post: 60 %
FVC-%Pred-Pre: 60 %
FVC-Post: 1.69 L
FVC-Pre: 1.68 L
Post FEV1/FVC ratio: 85 %
Post FEV6/FVC ratio: 100 %
Pre FEV1/FVC ratio: 82 %
Pre FEV6/FVC Ratio: 100 %
RV % pred: 63 %
RV: 1.32 L
TLC % pred: 65 %
TLC: 3.11 L

## 2022-05-20 NOTE — Progress Notes (Signed)
PFT done today. 

## 2022-05-20 NOTE — Progress Notes (Signed)
Subjective:     Patient ID: Brianna [name], female   DOB: 16-Jun-1951,     MRN: [id number]       Brief patient profile:  71 yowf  quit smoking age 71 with no resp problems then @  36 bad pneumonia.  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   No sign doe but not that active.   Sleeping ok without nocturnal  or early am exac of resp c/o's or need for noct saba.  rec Ok to restart boniva the first of September and every month but if your respiratory or reflux symptoms worsen it's first medication I would stop and take in it's place Reclast IV yearly.    11/13/2013 f/u ov/Brianna [name] re:  PF assoc with RA Chief Complaint  Patient presents with   Followup with PFT    Pt states that her breathing is unchanged since her last visit. No new co's today.    can now do 1st to 2nd floor s sob and working out regularly s limits due to sob rec  Return in one year for pfts and cxr and call sooner if not decline in tolerance > did not return     04/22/2017  [facility] office visit/ Brianna [name]  / re-establish re RA assoc ILD  Chief Complaint  Patient presents with   Pulmonary Consult    Self referral for cough x 3-4 months. She is coughing up white to yellow sputum.    joints doing great on humira but says "RA attacking both eyes " Has had more gerd attributed to "too much coffee" while on fosfamax x 2 years just on otc ppi prn  Cough onset early summer 2018  insidious/ persistent usually comes on first thing in am and sometimes wakes her up assoc with nasal congestion severe cough to point where she can't catch her breath  but then does fine on gxt x 3.6  mph at 5-6 grades if not coughing.  rec Augmentin 875 mg take one pill twice daily  X 10 days - take at breakfast and supper with large glass of water.  It would help reduce the usual side effects (diarrhea and yeast infections) if you ate cultured yogurt at lunch.  Prednisone 10 mg take  4 each am x 2 days,   2 each am x 2 days,  1 each am x 2 days and stop  Stop fosfamax for now  Pantoprazole (protonix) 40 mg   Take  30-60 min before first meal of the day and Pepcid (famotidine)  20 mg one @  bedtime until return to office - this is the best way to tell whether stomach acid is contributing to your problem.   GERD diet     06/14/2017  f/u ov/Brianna [name] re:  uacs ? Aggravated by fosfamax  Chief Complaint  Patient presents with   Follow-up    PFT done today. Her cough is much improved. No new co's. She wants to discuss alternative med for bones since we stopped the fosamax.  cough still present somedays p stirring in  am / no pm cough at all which is quite an improvement s need for cough suppression Rates herself at 90% better  rec Restart fosmax 2 weeks before visit (= 2 doses ) prior to your visit with your rheumatologist Risk of reflux goes up on fosfamax so ideally I would recommend a substitute either Reclast or Prolia if feasible but defer the final call on this issue to your rheumatologist        09/30/2020  f/u ov/Brianna [name] re: pnds not able to take any antihistamines  Chief Complaint  Patient presents with   Follow-up    Cough is unchanged since there last visit. She has prod cough with yellow to clear sputum in the mornings.   Dyspnea: walking dog brisk pace/ up hills no more cps / no desats when checks but this is usually p ex  Cough: worse in am's x 3 years / can't take any inhistamines / assoc with pnds Sleeping: 30 degrees electric bed  comfortable SABA use: none 02: none  Covid status:   vax x 3  Rec Flonase twice daily each nostril  If not  better :  I emphasized that nasal steroids(flonase)  have no immediate benefit in terms of improving symptoms If still not better > see you ENT next and let him know about your CT sinus Make sure you check your oxygen saturation at your highest level of activity to be sure it stays over 90%    09/22/2021  f/u ov/Brianna [name] re: PF / RA   maint on no resp rx   No chief complaint on file.  Dyspnea:  not walking at much due to L knee  Cough: assoc with pnds / Sleeping: electric bed x 30 degrees  SABA use: none  02: none  Rec Make sure you check your oxygen saturation at your highest level of activity to be sure it stays over 90%  Please remember to go to the  x-ray department  for your tests - we will call you with the results when they are available  Try Mucinex D  over the counter - you have to sign for it and follow up with ENT as planned  pfts  ENT ov 03/24/22 for loss of taste with nl exam x for throat clearing rec rx for GERD and note says pt unsure about meds   05/20/2022  f/u ov/Brianna [name] re: RA lung dz   maint on protonix now at 40 / pepcid 20 mg s heart burn  any more   Chief Complaint  Patient presents with   Follow-up   Dyspnea: somewhat  limited by eye site /walking dog uphills./ not checking sats  Cough: just throat clearing - no excess am mucus  Sleeping: 30 degrees  SABA use: non 02: none Covid status:   vax x 2  Lung cancer screening :  none     No obvious day to day or daytime variability or assoc excess/ purulent sputum or mucus plugs or hemoptysis or cp or chest tightness, subjective wheeze or overt sinus or hb symptoms.   Sleeping  without nocturnal  or early am exacerbation  of respiratory  c/o's or need for noct saba. Also denies any obvious fluctuation of symptoms with weather or environmental changes or other aggravating or alleviating factors except as outlined above   No unusual exposure hx or h/o childhood pna/ asthma or knowledge of premature birth.  Current Allergies,  Complete Past Medical History, Past Surgical History, Family History, and Social History  were reviewed in [facility] record.  ROS  The following are not active complaints unless bolded Hoarseness, sore throat, dysphagia, dental problems, itching, sneezing,  nasal congestion or discharge of excess mucus or purulent secretions, ear ache,   fever, chills, sweats, unintended wt loss or wt gain, classically pleuritic or exertional cp,  orthopnea pnd or arm/hand swelling  or leg swelling, presyncope, palpitations, abdominal pain, anorexia, nausea, vomiting, diarrhea  or change in bowel habits or change in bladder habits, change in stools or change in urine, dysuria, hematuria,  rash, arthralgias, visual complaints, headache, numbness, weakness or ataxia or problems with walking or coordination,  change in mood or  memory.        Current Meds  Medication Sig   ascorbic Acid (VITAMIN C) 500 MG CPCR Take 500 mg by mouth daily.   aspirin 325 MG tablet Take 325 mg by mouth daily.   botulinum toxin Type A (BOTOX) 100 units SOLR injection Inject IM into the head and neck muscles every 3 months by provider in office   butalbital-acetaminophen-caffeine (FIORICET, ESGIC) 50-325-40 MG tablet Take 1 tablet by mouth every 6 (six) hours as needed for headache.   Calcium Carbonate-Vit D-Min (CALCIUM 1200 PO) Take 2 capsules by mouth as needed.   chlorthalidone (HYGROTON) 25 MG tablet Take 1 tablet (25 mg total) by mouth daily.   Cholecalciferol (VITAMIN D PO) Take 1,000 Units by mouth daily.    DILANTIN 100 MG ER capsule TAKE 3 CAPSULES BY MOUTH DAILY   docusate sodium (COLACE) 100 MG capsule Take 1 capsule (100 mg total) by mouth 2 (two) times daily. (Patient taking differently: Take 100 mg by mouth as needed.)   Erenumab-aooe (AIMOVIG) 140 MG/ML SOAJ Inject 140 mg into the skin every 30 (thirty) days. (Patient taking differently: Inject 140 mg into the skin every 21 ( twenty-one) days.)    Evolocumab (REPATHA SURECLICK) [id number] MG/ML SOAJ Inject 1 pen. into the skin every 14 (fourteen) days.   ezetimibe (ZETIA) 10 MG tablet Take 1 tablet (10 mg total) by mouth daily.   famotidine (PEPCID) 20 MG tablet TAKE 1 TABLET(20 MG) BY MOUTH AT BEDTIME (Patient taking differently: Take 20 mg by mouth 2 (two) times daily.)   HUMIRA PEN 40 MG/0.4ML PNKT Inject 40 mg into the skin every 14 (fourteen) days. As directed   Insulin Pen Needle 31G X 5 MM MISC Use to inject Forteo once daily. DO NOT REUSE. Forteo received through patient assistance   leflunomide (ARAVA) 20 MG tablet Take 20 mg by mouth daily.   LORazepam (ATIVAN) 0.5 MG tablet Take 0.5 mg by mouth daily as needed for anxiety or sleep.   losartan (COZAAR) 100 MG tablet Take 1 tablet (100 mg total) by mouth daily.   pantoprazole (PROTONIX) 40 MG tablet TAKE 1 TABLET(40 MG) BY MOUTH DAILY   promethazine (PHENERGAN) 25 MG tablet Take 1 tablet (25 mg total) by mouth every 6 (six) hours as needed for nausea or vomiting.   RESTASIS 0.05 % ophthalmic emulsion 2 (two) times daily.   Rimegepant Sulfate (NURTEC) 75 MG TBDP Take 75 mg by mouth daily as needed. For migraines. Take as close to onset of migraine as possible. One daily maximum.   rosuvastatin (CRESTOR) 20 MG tablet Take 1 tablet (20 mg total) by mouth daily.   spironolactone (ALDACTONE) 25 MG tablet Take 0.5 tablets (12.5 mg total) by mouth daily.   SUMAtriptan (IMITREX) 6 MG/0.5ML SOLN injection Inject 0.5 mLs (6 mg total)  into the skin every 2 (two) hours as needed for migraine or headache. Take one dose at headache onset, can take additional dose 2hrs later if needed. No more then 2 injections in 24hrs   tacrolimus (PROGRAF) 1 MG capsule Take 1 mg by mouth 2 (two) times daily.   Teriparatide, Recombinant, (FORTEO) 600 MCG/2.4ML SOPN Inject 20 mcg into the skin daily.   Current Facility-Administered Medications for the 05/20/22 encounter (Office Visit) with Tanda Rockers, MD   Medication   0.9 %  sodium chloride infusion               Objective:   Physical Exam   wts   05/20/2022    115  09/22/2021      113  09/30/2020    118 06/23/2020    122  06/22/2019  128  06/21/2018    126   Wt 130 01/14/2011 > 08/27/2013  126 >  11/13/2013 125 > 04/22/2017  125 > 06/14/2017   127 > 10/11/2017  124 >   Vital signs reviewed  05/20/2022  - Note at rest 02 sats  98% on RA   General appearance:  amb wf freq throat clearing      HEENT : Oropharynx  clear     Nasal turbinates nl/ Ears with wax bilaterally    NECK :  without  apparent JVD/ palpable Nodes/TM    LUNGS: no acc muscle use,  Nl contour chest  very min crackles in bases  bilaterally   CV:  RRR  no s3 or murmur or increase in P2, and no edema   ABD:  soft and nontender with nl inspiratory excursion in the supine position. No bruits or organomegaly appreciated   MS:  Nl gait/ ext warm with RA changess both hands   Or obvious joint restrictions  calf tenderness, cyanosis or clubbing    SKIN: warm and dry without lesions    NEURO:  alert, approp, nl sensorium with  no motor or cerebellar deficits apparent.      Assessment:

## 2022-05-20 NOTE — Patient Instructions (Addendum)
We will walk you today to compare to priors   To get the most out of exercise, you need to be continuously aware that you are short of breath, but never out of breath, for at least 30 minutes daily. As you improve, it will actually be easier for you to do the same amount of exercise  in  30 minutes so always push to the level where you are short of breath.      Make sure you check your oxygen saturations at highest level of activity   If can't stop clearing throat,  return to ENT with your acid/reflux medication in hand or see Dr Eliott Nine   Please schedule a follow up visit in 6 months but call sooner if needed

## 2022-05-23 ENCOUNTER — Encounter: Payer: Self-pay | Admitting: Internal Medicine

## 2022-05-23 NOTE — Assessment & Plan Note (Signed)
Assoc with RA      - PFT's 06/15/2006 FEV1 (1.53)  Ratio 84 and DLC0 46 corrects to 90%     - PFT's  03/04/2011  FEV1 (1.73)  Ratio 83 and VC 2.11 and DLC0 63% corrects  94%     - PFT's 11/13/2013    VC 1.94 with DLCO 65 corrects to 102%     - 08/27/2013  Walked RA x 2 laps @ 185 ft each stopped due to  Oximeter stopped recording accurately but sats still 96% and no sob - PFT's  06/14/2017  FVC 1.71  (60 % ) ratio 89  p 5 % improvement from saba p nothing prior to study with DLCO  59 % corrects to 97 % for alv volume   PFT's  06/21/2018  FVC  1.85 (65%)   p no % improvement from saba p nothing prior to study with DLCO  65 % corrects to 106  % for alv volume   PFT's  06/22/2019  FVC 1.68 (58 % )   with DLCO  12.93 (70%) corrects to 4.59 (108 %)  for alv volume and FV curve nl   CHEST  HRCT  03/03/20  1. Pulmonary parenchymal pattern of fibrosis appears grossly stable from 07/21/2010. Findings are consistent with UIP per consensus guidelines: Diagnosis of Idiopathic Pulmonary Fibrosis: An Official ATS/ERS/JRS/ALAT Clinical Practice Guideline. [place], Iss 5, [phone], Mar 19 2017. 2. Aortic atherosclerosis (ICD10-I70.0). Coronary artery calcification. 3. Enlarged pulmonic trunk, indicative of pulmonary arterial hypertension.      - Echo   05/12/20  LVH nod / GII diastolic dysfunction /  PAS around 40 with nl RV -  06/23/2020   Walked RA  3laps @ approx 225ft each @ brisk pace  stopped due to end of study, no desats, no sob or CP with sats at end = 97% - 05/20/2022   Walked on RA  x  3  lap(s) =  approx 750  ft  @ fast pace, stopped due to end of study s sob  with lowest 02 sats 94%  - .PFT's  05/20/2022    FVC 1.68 (60% )   with DLCO  11.53 (63%) corrects to  93%  for alv volume and FV curve nl    No evidence of clinically significant dz progresssion since 2012 so key is continued control of systemic inflammation (RA/ per rheum) and f/u 6 m ? With HRCT to compare to priors    In meantime advised:  Make sure you check your oxygen saturation at your highest level of activity to be sure it stays over 90% and keep track of it at least once a week, more often if breathing getting worse, and let me know if losing ground.          Each maintenance medication was reviewed in detail including emphasizing most importantly the difference between maintenance and prns and under what circumstances the prns are to be triggered using an action plan format where appropriate.  Total time for H and P, chart review, counseling,  and generating customized AVS unique to this office visit / same day charting > 30 min

## 2022-05-23 NOTE — Assessment & Plan Note (Signed)
Onset around 2012   - Sinus CT  01/14/2011 > neg - Trial off fosfamax and on GERD rx 04/22/17 > improved 06/14/2017  - recurrent fall 2020 > try zyrtec and if not better add back ppi ac  - Allergy profile 06/23/2020 >  Eos 0.5/  IgE  59 - Sinus CT 07/04/2020 >>> Clear paranasal sinuses. Mild proximal narrowing of the bilateral ostiomeatal units secondary to mucosal thickening - 09/30/2020 trial of flonase/afrin rec if not better > ent next   Still throat clearing > reviewed rx and options for ent eval if not responding to conservative measures

## 2022-05-24 ENCOUNTER — Other Ambulatory Visit: Payer: Self-pay | Admitting: Rheumatology

## 2022-05-24 DIAGNOSIS — M810 Age-related osteoporosis without current pathological fracture: Secondary | ICD-10-CM

## 2022-05-24 NOTE — Telephone Encounter (Signed)
Next Visit: 08/12/2022  Last Visit: 03/10/2022  Last Fill: 12/07/2021  DX: Age-related osteoporosis without current pathological fracture   Current Dose per office note 03/10/2022:  Forteo daily injections in May 2023   Labs: 04/14/2022 Chloride 97, Alk. Phos 145, Eosinophil Absolute 0.6  Okay to refill Forteo?

## 2022-05-28 DIAGNOSIS — Z79899 Other long term (current) drug therapy: Secondary | ICD-10-CM | POA: Diagnosis not present

## 2022-05-28 DIAGNOSIS — Z961 Presence of intraocular lens: Secondary | ICD-10-CM | POA: Diagnosis not present

## 2022-05-28 DIAGNOSIS — Z48810 Encounter for surgical aftercare following surgery on the sense organs: Secondary | ICD-10-CM | POA: Diagnosis not present

## 2022-05-28 DIAGNOSIS — H18601 Keratoconus, unspecified, right eye: Secondary | ICD-10-CM | POA: Diagnosis not present

## 2022-05-28 DIAGNOSIS — Z947 Corneal transplant status: Secondary | ICD-10-CM | POA: Diagnosis not present

## 2022-05-28 DIAGNOSIS — H30033 Focal chorioretinal inflammation, peripheral, bilateral: Secondary | ICD-10-CM | POA: Diagnosis not present

## 2022-05-28 DIAGNOSIS — Z9889 Other specified postprocedural states: Secondary | ICD-10-CM | POA: Diagnosis not present

## 2022-05-28 DIAGNOSIS — H04123 Dry eye syndrome of bilateral lacrimal glands: Secondary | ICD-10-CM | POA: Diagnosis not present

## 2022-06-01 ENCOUNTER — Telehealth: Payer: Self-pay | Admitting: *Deleted

## 2022-06-01 NOTE — Telephone Encounter (Signed)
Patient contacted the office requesting a refill on her Forteo. Patient advised we sent refill to the pharmacy on 05/24/2022. Patient advised to reach out to the pharmacy and call back if she has any issues. Patient expressed understanding. Patient also stated she will come by the office to drop off her patient assistance forms to [facility].

## 2022-06-02 ENCOUNTER — Telehealth: Payer: Self-pay | Admitting: Pharmacist

## 2022-06-02 DIAGNOSIS — M810 Age-related osteoporosis without current pathological fracture: Secondary | ICD-10-CM

## 2022-06-02 NOTE — Telephone Encounter (Signed)
Per previous telephone encounter, patient plans to drop off her portion of PAP renewal applicaton for Forteo through [place]  Phone: [phone] Fax: [phone] [id number]  Chesley Mires, PharmD, MPH, BCPS, CPP Clinical Pharmacist (Rheumatology and Pulmonology)

## 2022-06-03 DIAGNOSIS — Z9889 Other specified postprocedural states: Secondary | ICD-10-CM | POA: Diagnosis not present

## 2022-06-03 DIAGNOSIS — Z961 Presence of intraocular lens: Secondary | ICD-10-CM | POA: Diagnosis not present

## 2022-06-03 DIAGNOSIS — H18601 Keratoconus, unspecified, right eye: Secondary | ICD-10-CM | POA: Diagnosis not present

## 2022-06-03 DIAGNOSIS — H04123 Dry eye syndrome of bilateral lacrimal glands: Secondary | ICD-10-CM | POA: Diagnosis not present

## 2022-06-03 DIAGNOSIS — H353221 Exudative age-related macular degeneration, left eye, with active choroidal neovascularization: Secondary | ICD-10-CM | POA: Diagnosis not present

## 2022-06-03 DIAGNOSIS — Z947 Corneal transplant status: Secondary | ICD-10-CM | POA: Diagnosis not present

## 2022-06-03 DIAGNOSIS — H30033 Focal chorioretinal inflammation, peripheral, bilateral: Secondary | ICD-10-CM | POA: Diagnosis not present

## 2022-06-03 DIAGNOSIS — H401112 Primary open-angle glaucoma, right eye, moderate stage: Secondary | ICD-10-CM | POA: Diagnosis not present

## 2022-06-03 DIAGNOSIS — H401123 Primary open-angle glaucoma, left eye, severe stage: Secondary | ICD-10-CM | POA: Diagnosis not present

## 2022-06-15 ENCOUNTER — Ambulatory Visit: Payer: Medicare PPO | Admitting: Neurology

## 2022-06-15 NOTE — Telephone Encounter (Addendum)
Called patient to follow-up on her indication that she would be dropping off the completed patient portion of PAP renewal paperwork. Patient indicated that [employer] stated they would send her the paperwork and she has yet to receive it. Informed the patient we will send her the paperwork via email ([email or web address]) and she can complete it and bring it back to clinic.   Provider portion placed in Sherron Ales, PA-C's folder for signature  [facility] [employer] [facility] PharmD Candidate [phone]

## 2022-06-29 ENCOUNTER — Ambulatory Visit: Payer: Medicare PPO | Admitting: Neurology

## 2022-06-29 DIAGNOSIS — H3093 Unspecified chorioretinal inflammation, bilateral: Secondary | ICD-10-CM | POA: Diagnosis not present

## 2022-06-29 DIAGNOSIS — Z48298 Encounter for aftercare following other organ transplant: Secondary | ICD-10-CM | POA: Diagnosis not present

## 2022-06-29 DIAGNOSIS — Z79899 Other long term (current) drug therapy: Secondary | ICD-10-CM | POA: Diagnosis not present

## 2022-06-29 DIAGNOSIS — H401112 Primary open-angle glaucoma, right eye, moderate stage: Secondary | ICD-10-CM | POA: Diagnosis not present

## 2022-06-29 DIAGNOSIS — H353221 Exudative age-related macular degeneration, left eye, with active choroidal neovascularization: Secondary | ICD-10-CM | POA: Diagnosis not present

## 2022-06-29 DIAGNOSIS — Z947 Corneal transplant status: Secondary | ICD-10-CM | POA: Diagnosis not present

## 2022-06-29 DIAGNOSIS — H04123 Dry eye syndrome of bilateral lacrimal glands: Secondary | ICD-10-CM | POA: Diagnosis not present

## 2022-06-29 DIAGNOSIS — Z961 Presence of intraocular lens: Secondary | ICD-10-CM | POA: Diagnosis not present

## 2022-06-29 DIAGNOSIS — H401123 Primary open-angle glaucoma, left eye, severe stage: Secondary | ICD-10-CM | POA: Diagnosis not present

## 2022-07-05 NOTE — Telephone Encounter (Signed)
I called the pt and LVM (ok per DPR) just to confirm pt's insurance hasn't changed before her botox appt on 07/07/22. If she has changes for 2024 we will need to know that as well so we can get authorizations done before those future appointments. Also sent mychart message.

## 2022-07-07 ENCOUNTER — Ambulatory Visit: Payer: Medicare PPO | Admitting: Neurology

## 2022-07-07 DIAGNOSIS — G43719 Chronic migraine without aura, intractable, without status migrainosus: Secondary | ICD-10-CM

## 2022-07-07 DIAGNOSIS — G43711 Chronic migraine without aura, intractable, with status migrainosus: Secondary | ICD-10-CM

## 2022-07-07 MED ORDER — SUMATRIPTAN SUCCINATE 6 MG/0.5ML ~~LOC~~ SOLN: 6.0000 mg | SUBCUTANEOUS | 11 refills | Status: DC | PRN

## 2022-07-07 MED ORDER — ONABOTULINUMTOXINA 200 UNITS IJ SOLR
155.0000 [IU] | Freq: Once | INTRAMUSCULAR | Status: AC
  Administered 2022-07-07: 155 [IU] via INTRAMUSCULAR

## 2022-07-07 NOTE — Progress Notes (Signed)
Botox- 200 units x 1 vial Lot: [id number] Expiration: 11/2024 NDC: 0023-3921-02  Bacteriostatic 0.9% Sodium Chloride- 4mL total Lot: [id number]   Expiration: 03/20/2023 NDC: 0409-1966-02  Dx: G43.711 B/B  

## 2022-07-07 NOTE — Addendum Note (Signed)
Addended by: Naomie Dean B on: 07/07/2022 11:37 AM   Modules accepted: Orders

## 2022-07-07 NOTE — Telephone Encounter (Signed)
Patient dropped off her portion of [employer] application with medical bils for her and her hsuband  Submitted Patient Assistance Application to [facility] for FORTEO along with provider portion, patient portion, med list, insurance card copy, PA (for 2023) and income documents. Will update patient when we receive a response.  Fax# [phone] Phone# [phone]  Submitted a Prior Authorization RENEWAL request to [facility] for FORTEO via CoverMyMeds. Will update once we receive a response.  Key: [id number]  Based on income written, patient will likely be denied again and will have to appeal. She dropped off medical bills to move forward with appeal once denial is receive.d  Chesley Mires, PharmD, MPH, BCPS, CPP Clinical Pharmacist (Rheumatology and Pulmonology)

## 2022-07-07 NOTE — Patient Instructions (Signed)
07/07/2022: stable  03/16/2022: stable; >70% improvement in frequency of migraines, Baseline 15 severemigraine days a month and daily headaches. No corrugators or procerus. Very high frontalis. Gave nurtec samples.   12/01/2021: stable  09/08/2021: stable  Interval history 06/09/2021: stable. No corrugators or procerus. Very high frontalis.  Broke her patella in a wheelchair now.>70% improvement in frequency of migraines, Baseline 15 severemigraine days a month and daily headaches.  Interval history 03/10/2021: stable. No corrugators or procerus. Very high frontalis. Rest in traps. Broke her patella in a wheelchair now.  Interval history 12/01/2020: stable  Send to Dr. Vickey Huger, intractable headches, morning headaches: Intractable headaches, wakes with headaches, has headaches when she wakes in the morning, hx of strokes: sleep evaluation  Imitrex injections: Patient has intractable chronic migraines, the best thing that is ever worked for her is Imitrex injections, we have tried multiple alternative medications including analgesics, Nurtec, Ubrelvy, Reyvow, and multiple other for second and third line acute management medications.  Patient had stroke, at that time we took her off her Imitrex, today she implore only request to have it back, she understands the risks, we did review the risks and discussed that sometimes life is about weighing the benefits versus those risks, at this time I will prescribe them for her, she is fully aware of the risk that she is taking with a history of vascular disease.  Interval history 02/12/2020: Migraines fluctuate with her eye problems, she sees cornea specialists and retina specialists and they may install long term steroid release long term in the eyes. Las botox she had only 4 migraine days a month taken care of by imitrex, significant.  >70% improvement in frequency of migraines, Baseline 15 severemigraine days a month and daily headaches. No clenching.  She  absolutely loved  .Dry needling: cervical myofascial pain, Send back to dry needling - [address] for cervical myofascial pain syndrome. Gave samples for Nurtec for her to try as well acutely at last visit but she did not like it.  Tried: VPA, Topamax, antidepressants, norvasc, phenergan, frova, aimovig, ajovy, fioricet, (save 6 As), verapamil     Consent Form Botulism Toxin Injection For Chronic Migraine    Reviewed orally with patient, additionally signature is on file:  Botulism toxin has been approved by the Federal drug administration for treatment of chronic migraine. Botulism toxin does not cure chronic migraine and it may not be effective in some patients.  The administration of botulism toxin is accomplished by injecting a small amount of toxin into the muscles of the neck and head. Dosage must be titrated for each individual. Any benefits resulting from botulism toxin tend to wear off after 3 months with a repeat injection required if benefit is to be maintained. Injections are usually done every 3-4 months with maximum effect peak achieved by about 2 or 3 weeks. Botulism toxin is expensive and you should be sure of what costs you will incur resulting from the injection.  The side effects of botulism toxin use for chronic migraine may include:   -Transient, and usually mild, facial weakness with facial injections  -Transient, and usually mild, head or neck weakness with head/neck injections  -Reduction or loss of forehead facial animation due to forehead muscle weakness  -Eyelid drooping  -Dry eye  -Pain at the site of injection or bruising at the site of injection  -Double vision  -Potential unknown long term risks  Contraindications: You should not have Botox if you are pregnant, nursing, allergic to albumin,  have an infection, skin condition, or muscle weakness at the site of the injection, or have myasthenia gravis, Lambert-Eaton syndrome, or ALS.  It is also possible  that as with any injection, there may be an allergic reaction or no effect from the medication. Reduced effectiveness after repeated injections is sometimes seen and rarely infection at the injection site may occur. All care will be taken to prevent these side effects. If therapy is given over a long time, atrophy and wasting in the muscle injected may occur. Occasionally the patient's become refractory to treatment because they develop antibodies to the toxin. In this event, therapy needs to be modified.  I have read the above information and consent to the administration of botulism toxin.    BOTOX PROCEDURE NOTE FOR MIGRAINE HEADACHE    Contraindications and precautions discussed with patient(above). Aseptic procedure was observed and patient tolerated procedure. Procedure performed by Dr. Artemio Aly  The condition has existed for more than 6 months, and pt does not have a diagnosis of ALS, Myasthenia Gravis or Lambert-Eaton Syndrome.  Risks and benefits of injections discussed and pt agrees to proceed with the procedure.  Written consent obtained  These injections are medically necessary. Pt  receives good benefits from these injections. These injections do not cause sedations or hallucinations which the oral therapies may cause.  Description of procedure:  The patient was placed in a sitting position. The standard protocol was used for Botox as follows, with 5 units of Botox injected at each site:   -Procerus muscle, midline injection DID NOT PERFORM  -Corrugator muscle, bilateral injection DID NOT PERFORM  -Frontalis muscle, bilateral injection, with 2 sites each side, medial injection was performed in the upper one third of the frontalis muscle, in the region vertical from the medial inferior edge of the superior orbital rim. The lateral injection was again in the upper one third of the forehead vertically above the lateral limbus of the cornea, 1.5 cm lateral to the medial injection  site.  -Temporalis muscle injection, 4 sites, bilaterally. The first injection was 3 cm above the tragus of the ear, second injection site was 1.5 cm to 3 cm up from the first injection site in line with the tragus of the ear. The third injection site was 1.5-3 cm forward between the first 2 injection sites. The fourth injection site was 1.5 cm posterior to the second injection site.   -Occipitalis muscle injection, 3 sites, bilaterally. The first injection was done one half way between the occipital protuberance and the tip of the mastoid process behind the ear. The second injection site was done lateral and superior to the first, 1 fingerbreadth from the first injection. The third injection site was 1 fingerbreadth superiorly and medially from the first injection site.  -Cervical paraspinal muscle injection, 2 sites, bilateral knee first injection site was 1 cm from the midline of the cervical spine, 3 cm inferior to the lower border of the occipital protuberance. The second injection site was 1.5 cm superiorly and laterally to the first injection site.  -Trapezius muscle injection was performed at 3 sites, bilaterally. The first injection site was in the upper trapezius muscle halfway between the inflection point of the neck, and the acromion. The second injection site was one half way between the acromion and the first injection site. The third injection was done between the first injection site and the inflection point of the neck.   Will return for repeat injection in 3 months.  140 units of Botox was used, 60U Botox not injected was wasted. The patient tolerated the procedure well, there were no complications of the above procedure.

## 2022-07-08 NOTE — Telephone Encounter (Signed)
Received fax from [facility] for FORTEO patient assistance, patient's application has been DENIED due to patient not meeting financial criteria for the program. Will need to appeal.    Phone# [phone] Fax# [phone]  Chesley Mires, PharmD, MPH, BCPS, CPP Clinical Pharmacist (Rheumatology and Pulmonology)

## 2022-07-09 NOTE — Telephone Encounter (Signed)
Received notification from [facility] regarding a prior authorization for FORTEO. Authorization has been APPROVED from 07/08/22 to 07/19/2023. Approval letter sent to scan center.  Authorization # [id number] Phone # [phone]  Will need to submit PAP reconsideration documents  Chesley Mires, PharmD, MPH, BCPS, CPP Clinical Pharmacist (Rheumatology and Pulmonology)

## 2022-07-10 DIAGNOSIS — K644 Residual hemorrhoidal skin tags: Secondary | ICD-10-CM | POA: Diagnosis not present

## 2022-07-16 DIAGNOSIS — M1812 Unilateral primary osteoarthritis of first carpometacarpal joint, left hand: Secondary | ICD-10-CM | POA: Diagnosis not present

## 2022-07-20 DIAGNOSIS — H6120 Impacted cerumen, unspecified ear: Secondary | ICD-10-CM | POA: Diagnosis not present

## 2022-07-21 ENCOUNTER — Telehealth: Payer: Self-pay | Admitting: *Deleted

## 2022-07-21 DIAGNOSIS — G43719 Chronic migraine without aura, intractable, without status migrainosus: Secondary | ICD-10-CM

## 2022-07-21 NOTE — Telephone Encounter (Signed)
Brianna [name] (Key: Loni Dolly) PA Case ID #: [id number] Rx #: [id number]   PA Sumatriptan complete waiting on approval

## 2022-07-22 NOTE — Telephone Encounter (Signed)
PA Case ID #: [id number] Rx #: [id number] Need Help? Call us at [phone] Approved on January 3 PA Case: [id number],  Status: Approved, Coverage Starts on: 07/19/2022 12:00:00 AM,  Coverage Ends on: 07/19/2023 12:00:00 AM. Questions? Contact [phone].

## 2022-07-28 ENCOUNTER — Other Ambulatory Visit (HOSPITAL_COMMUNITY): Payer: Self-pay

## 2022-07-29 NOTE — Progress Notes (Signed)
Office Visit Note  [name]             Date of Birth: 1950-10-29           MRN: [id number]             PCP: [name], DO Referring: [name], DO Visit Date: 08/12/2022 Occupation: @GUAROCC @  Subjective:  Left thumb pain  History of Present Illness: Brianna [name] is a 72 y.o. female history of seropositive rheumatoid arthritis, iritis and interstitial lung disease.  She states she has been experiencing increased pain and discomfort in her left first MCP joint.  She was seen by Dr. Fredna Dow who recommended possible future surgery.  She states she had a good response to cortisone injection in March 2023.  The discomfort is coming back.  She plans to use a splint at this time.  He has intermittent discomfort in his cervical spine with will left-sided radiculopathy when she bends her cervical spine.  None of the other joints are painful or swollen.  She has been taking leflunomide 20 mg daily and Humira 40 mg subcu every other week which is prescribed by Dr. Manuella Ghazi.  She had no recurrence of iritis since she has been on the combination therapy.  She has been followed by Dr. Felton Clinton for ILD.  She has been on Forteo injections since May 2023.  Has been taking vitamin D supplement.    Activities of Daily Living:  Patient reports morning stiffness for 0 minute.   Patient Denies nocturnal pain.  Difficulty dressing/grooming: Denies Difficulty climbing stairs: Denies Difficulty getting out of chair: Denies Difficulty using hands for taps, buttons, cutlery, and/or writing: Reports  Review of Systems  Constitutional:  Positive for fatigue.  HENT: Negative.  Negative for mouth sores and mouth dryness.   Eyes:  Positive for dryness.  Respiratory: Negative.  Negative for shortness of breath.   Cardiovascular: Negative.  Negative for chest pain and palpitations.  Gastrointestinal:  Positive for constipation and diarrhea. Negative for blood in stool.  Endocrine: Negative.   Negative for increased urination.  Genitourinary: Negative.  Negative for involuntary urination.  Musculoskeletal:  Positive for joint pain, gait problem, joint pain, joint swelling and muscle tenderness. Negative for myalgias, muscle weakness, morning stiffness and myalgias.  Skin: Negative.  Negative for color change, rash, hair loss and sensitivity to sunlight.  Allergic/Immunologic: Negative.  Negative for susceptible to infections.  Neurological:  Positive for headaches. Negative for dizziness.  Hematological: Negative.  Negative for swollen glands.  Psychiatric/Behavioral: Negative.  Negative for depressed mood and sleep disturbance. The patient is not nervous/anxious.     PMFS History:  Patient Active Problem List   Diagnosis Date Noted   Coronary artery disease involving native coronary artery of native heart without angina pectoris 11/23/2021   Rhinitis, chronic 09/23/2021   Episodic cluster headache, not intractable 03/24/2021   Morning headache 03/24/2021   Cerebrovascular accident (CVA) ([place]) 03/24/2021   Chronic migraine without aura, intractable, without status migrainosus 03/24/2021   Patellar fracture 02/11/2021   Dyslipidemia 02/11/2021   Anxiety 08/28/2020   Carotid artery disease ([place]) 08/28/2020   Late effects of cerebrovascular disease 08/28/2020   Macular degeneration 08/28/2020   Other long term (current) drug therapy 08/28/2020   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[place]) 08/28/2020   Pure hypercholesterolemia 08/28/2020   Vitamin D deficiency 08/28/2020   Exertional chest pain 06/24/2020   Hypertension 05/12/2020   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   Acute urinary tract infection  11/13/2019   Epilepsy (HCC) 11/13/2019   Corneal ulcer of left eye 12/05/2018   Acute left eye pain 12/05/2018   Endophthalmitis, acute, left 12/05/2018   URI, acute 05/19/2018   Cerumen impaction 05/19/2018   Laceration of left middle finger without foreign body  without damage to nail 11/23/2017   Trigger thumb, right thumb 10/05/2017   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   Numbness 04/25/2017   Pain 04/25/2017   History of macular degeneration 09/13/2016   History of migraine 09/06/2016   History of seizures 09/06/2016   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   Secondary corneal edema of right eye 06/28/2016   Iritis of right eye 05/12/2016   Rheumatoid arthritis with positive rheumatoid factor (HCC) 05/11/2016   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   High risk medication use 05/11/2016   Osteoporosis 05/11/2016   Intractable chronic migraine without aura and without status migrainosus 04/02/2016   Peripheral focal chorioretinal inflammation of both eyes 01/04/2016   Squamous blepharitis of upper and lower eyelids of both eyes 10/22/2015   Dry eyes, bilateral 10/21/2015   History of high risk medication treatment 04/28/2015   Cystoid macular edema of left eye 09/23/2014   History of carotid endarterectomy 06/27/2014   Acquired myogenic ptosis of both eyelids 06/07/2014   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   Migraine without aura 04/19/2013   Seizures (HCC) 04/19/2013   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   Keratoconus, 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   Minor corneal opacity 08/02/2011   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   Cough 01/14/2011   Pulmonary fibrosis (HCC) 01/14/2011    Past Medical History:  Diagnosis Date   Arthritis    in the neck   Carotid artery occlusion    Epilepsy (HCC)    GERD (gastroesophageal reflux disease)    Glaucoma    Hyperlipidemia    Hypertension    Keratoconus of both eyes 1981   Macular degeneration    Migraines    Peripheral vascular disease (HCC)    carotid occlusion surgery on left   Raynaud's disease    Retinal edema    Rheumatoid arthritis(714.0)    Stroke (HCC)    two mild strokes presumed from left carotid stenosis    Family History  Problem Relation Age of Onset   Heart disease Mother    Lung  disease Mother        ? disease process   Uterine cancer Mother    Heart disease Father    Clotting disorder Father    Collagen disease Father    Cervical cancer Maternal Aunt    Prostate cancer Maternal Grandfather    Rheum arthritis Sister    High Cholesterol Sister    Epilepsy Sister    Rheum arthritis Maternal Grandmother    High Cholesterol Brother    High blood pressure Brother    High blood pressure Brother    High Cholesterol Brother    High blood pressure Brother    High Cholesterol Brother    Past Surgical History:  Procedure Laterality Date   CAROTID ARTERY ANGIOPLASTY  2008   Dr Madilyn Fireman   CAROTID ENDARTERECTOMY Left 11-15-2007   cataract extraction Right 04-2014   [facility]   CHOLECYSTECTOMY  1995   CORNEAL TRANSPLANT     x 5 ; steroid inj. 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   [facility]   CORNEAL TRANSPLANT Left 04/17/2019   EYE SURGERY Right 12/2016   cornea repair    EYE SURGERY Right 09/20/2017  cornea transplant    GLAUCOMA SURGERY Right 2018   ORIF PATELLA Left 02/12/2021   Procedure: OPEN REDUCTION INTERNAL (ORIF) FIXATION PATELLA;  Surgeon: Yolonda Kida, MD;  Location: [facility] OR;  Service: Orthopedics;  Laterality: Left;   TRIGGER FINGER RELEASE Right 05/16/2018   Procedure: RELEASE TRIGGER FINGER/A-1 PULLEY RIGHT THUMB;  Surgeon: Cindee Salt, MD;  Location: [facility];  Service: Orthopedics;  Laterality: Right;   Social History   Social History Narrative   Patient is married to [name]), has 2 children   Patient is right handed   Education level is Bachelor's degree   Caffeine consumption is about 2 cups daily   Lives at home with husband    Immunization History  Administered Date(s) Administered   Fluad Quad(high Dose 65+) 05/14/2022   Influenza Split 04/26/2013, 04/08/2017   Influenza, High Dose Seasonal PF 04/09/2018   Influenza, Quadrivalent, Recombinant, Inj, Pf 04/21/2017, 03/19/2019    Influenza-Unspecified 04/19/2016   PFIZER(Purple Top)SARS-COV-2 Vaccination 08/24/2019, 09/18/2019, 05/12/2020, 10/29/2020   Pneumococcal Conjugate-13 08/27/2013, 07/05/2017   Pneumococcal Polysaccharide-23 01/14/2011   Tdap 11/15/2017   Zoster Recombinat (Shingrix) 07/19/2019, 11/28/2019     Objective: Vital Signs: BP 137/80 (BP Location: Left Arm, Patient Position: Sitting, Cuff Size: Normal)   Pulse 74   Resp 16   Ht 5\' 4"  (1.626 m)   Wt 114 lb 9.6 oz (52 kg)   BMI 19.67 kg/m    Physical Exam Vitals and nursing note reviewed.  Constitutional:      Appearance: She is well-developed.  HENT:     Head: Normocephalic and atraumatic.  Eyes:     Conjunctiva/sclera: Conjunctivae normal.  Cardiovascular:     Rate and Rhythm: Normal rate and regular rhythm.     Heart sounds: Normal heart sounds.  Pulmonary:     Effort: Pulmonary effort is normal.     Breath sounds: Normal breath sounds.  Abdominal:     General: Bowel sounds are normal.     Palpations: Abdomen is soft.  Musculoskeletal:     Cervical back: Normal range of motion.  Lymphadenopathy:     Cervical: No cervical adenopathy.  Skin:    General: Skin is warm and dry.     Capillary Refill: Capillary refill takes less than 2 seconds.  Neurological:     Mental Status: She is alert and oriented to person, place, and time.  Psychiatric:        Behavior: Behavior normal.      Musculoskeletal Exam: Cervical, thoracic and lumbar spine were in good range of motion.  Shoulder joints, elbow joints, wrist joints were in good range of motion.  She had thickening of her left first MCP joint with subluxation.  She had MCP PIP and DIP thickening but no synovitis was noted.  Hip joints and knee joints were in good range of motion.  She had no tenderness over ankles or MTPs.    CDAI Exam: CDAI Score: -- Patient Global: 0 mm; Provider Global: 0 mm Swollen: --; Tender: -- Joint Exam 08/12/2022   No joint exam has been documented  for this visit   There is currently no information documented on the homunculus. Go to the Rheumatology activity and complete the homunculus joint exam.  Investigation: No additional findings.  Imaging: No results found.  Recent Labs: Lab Results  Component Value Date   WBC 7.9 03/10/2022   HGB 13.0 03/10/2022   PLT 312 03/10/2022   NA 139 03/10/2022   K 4.6 03/10/2022  CL 99 03/10/2022   CO2 26 03/10/2022   GLUCOSE 99 03/10/2022   BUN 11 03/10/2022   CREATININE 0.69 03/10/2022   BILITOT 0.5 03/10/2022   ALKPHOS 104 03/18/2021   AST 37 (H) 03/10/2022   ALT 22 03/10/2022   PROT 7.9 03/10/2022   ALBUMIN 4.3 03/18/2021   CALCIUM 9.4 03/10/2022   GFRAA 107 05/26/2020   QFTBGOLDPLUS NEGATIVE 05/31/2018    Speciality Comments: Last Reclast 11/30/2017   Procedures:  No procedures performed Allergies: Morphine, Morphine and related, Alphagan [brimonidine], Codeine, Dorzolamide hcl-timolol mal, Hydrocodone-acetaminophen, Sulfa antibiotics, and Sulfamethoxazole   Assessment / Plan:     Visit Diagnoses: Rheumatoid arthritis involving multiple sites with positive rheumatoid factor (HCC)-she complains of discomfort in her left thumb for which she had seen Dr. Fara Boros.  She had no synovitis in the thumb.  She had good response to cortisone injection in the past.  She states she does not need an injection today.  She had been taking leflunomide and Humira on a regular basis without interruption.  High risk medication use - Arava 20 mg 1 tablet by mouth daily and Humira 40 mg sq every 14 days prescribed by Dr. Manuella Ghazi. -Labs from March 10, 2022 CBC and CMP were normal.  I will get labs today.  Advised her to get labs every 3 months.  Information on immunization was placed in the AVS.  She was advised to hold Humira and methotrexate if she develops an infection resume after the infection resolves.  Use of sunscreen and annual skin examination to screen for skin cancer was advised.  Plan: CBC  with Differential/Platelet, COMPLETE METABOLIC PANEL WITH GFR, QuantiFERON-TB Gold Plus  ILD (interstitial lung disease) ([place]) - She is followed by Dr. Melvyn Novas.  Chest x-ray 09/22/2021 stable fibrotic changes.  No acute infiltrate.  Iritis of right eye -patient denies any recent flare of iritis.  She is followed by Dr.Shah, Dr. Gilford Rile, and Dr. Edilia Bo. She is on Humira 40 mg subcutaneous injections every 14 days, which is prescribed by Dr. Manuella Ghazi.  Trochanteric bursitis, right hip-she has intermittent discomfort.  She denies any discomfort today.  DDD (degenerative disc disease), lumbar-she has intermittent lower back pain.  Age-related osteoporosis without current pathological fracture - DEXA on 02/09/2022: AP spine normal, left femoral neck T score -3.2, right femoral neck T score -2.8, left third forearm T score -3.4. started [facility] 11/2021.  She has been tolerating Forteo without any side effects.  She has been also taking vitamin D.  She walks for regular exercise.  Other medical problems are listed as follows:  History of seizures  History of carotid endarterectomy  History of migraine  History of glaucoma  History of macular degeneration  Orders: Orders Placed This Encounter  Procedures   CBC with Differential/Platelet   COMPLETE METABOLIC PANEL WITH GFR   QuantiFERON-TB Gold Plus   No orders of the defined types were placed in this encounter.    Follow-Up Instructions: Return in about 5 months (around 01/11/2023) for Rheumatoid arthritis, ILD.   Bo Merino, MD  Note - This record has been created using Editor, commissioning.  Chart creation errors have been sought, but may not always  have been located. Such creation errors do not reflect on  the standard of medical care.

## 2022-08-09 DIAGNOSIS — R946 Abnormal results of thyroid function studies: Secondary | ICD-10-CM | POA: Diagnosis not present

## 2022-08-09 DIAGNOSIS — Z0000 Encounter for general adult medical examination without abnormal findings: Secondary | ICD-10-CM | POA: Diagnosis not present

## 2022-08-09 DIAGNOSIS — Z79899 Other long term (current) drug therapy: Secondary | ICD-10-CM | POA: Diagnosis not present

## 2022-08-09 DIAGNOSIS — R7301 Impaired fasting glucose: Secondary | ICD-10-CM | POA: Diagnosis not present

## 2022-08-12 ENCOUNTER — Encounter: Payer: Self-pay | Admitting: Rheumatology

## 2022-08-12 ENCOUNTER — Ambulatory Visit: Payer: Medicare PPO | Attending: Rheumatology | Admitting: Rheumatology

## 2022-08-12 VITALS — BP 137/80 | HR 74 | Resp 16 | Ht 64.0 in | Wt 114.6 lb

## 2022-08-12 DIAGNOSIS — Z87898 Personal history of other specified conditions: Secondary | ICD-10-CM

## 2022-08-12 DIAGNOSIS — M0579 Rheumatoid arthritis with rheumatoid factor of multiple sites without organ or systems involvement: Secondary | ICD-10-CM

## 2022-08-12 DIAGNOSIS — Z79899 Other long term (current) drug therapy: Secondary | ICD-10-CM

## 2022-08-12 DIAGNOSIS — Z9889 Other specified postprocedural states: Secondary | ICD-10-CM

## 2022-08-12 DIAGNOSIS — M810 Age-related osteoporosis without current pathological fracture: Secondary | ICD-10-CM | POA: Diagnosis not present

## 2022-08-12 DIAGNOSIS — M5136 Other intervertebral disc degeneration, lumbar region: Secondary | ICD-10-CM

## 2022-08-12 DIAGNOSIS — Z8669 Personal history of other diseases of the nervous system and sense organs: Secondary | ICD-10-CM

## 2022-08-12 DIAGNOSIS — J849 Interstitial pulmonary disease, unspecified: Secondary | ICD-10-CM

## 2022-08-12 DIAGNOSIS — H401123 Primary open-angle glaucoma, left eye, severe stage: Secondary | ICD-10-CM | POA: Diagnosis not present

## 2022-08-12 DIAGNOSIS — M7061 Trochanteric bursitis, right hip: Secondary | ICD-10-CM

## 2022-08-12 DIAGNOSIS — H401112 Primary open-angle glaucoma, right eye, moderate stage: Secondary | ICD-10-CM | POA: Diagnosis not present

## 2022-08-12 DIAGNOSIS — H209 Unspecified iridocyclitis: Secondary | ICD-10-CM

## 2022-08-12 NOTE — Patient Instructions (Signed)
Standing Labs We placed an order today for your standing lab work.   Please have your standing labs drawn in April and every 3 months  Please have your labs drawn 2 weeks prior to your appointment so that the provider can discuss your lab results at your appointment.  Please note that you may see your imaging and lab results in [place] before we have reviewed them. We will contact you once all results are reviewed. Please allow our office up to 72 hours to thoroughly review all of the results before contacting the office for clarification of your results.  Lab hours are:   Monday through Thursday from 8:00 am -12:30 pm and 1:00 pm-5:00 pm and Friday from 8:00 am-12:00 pm.  Please be advised, all patients with office appointments requiring lab work will take precedent over walk-in lab work.   Labs are drawn by [facility]. Please bring your co-pay at the time of your lab draw.  You may receive a bill from [place] for your lab work.  Please note if you are on Hydroxychloroquine and and an order has been placed for a Hydroxychloroquine level, you will need to have it drawn 4 hours or more after your last dose.  If you wish to have your labs drawn at another location, please call the office 24 hours in advance so we can fax the orders.  The office is located at [address], [place], [place], [place] [address] No appointment is necessary.    If you have any questions regarding directions or hours of operation,  please call [phone].   As a reminder, please drink plenty of water prior to coming for your lab work. Thanks!   Vaccines You are taking a medication(s) that can suppress your immune system.  The following immunizations are recommended: Flu annually Covid-19  RSV Td/Tdap (tetanus, diphtheria, pertussis) every 10 years Pneumonia (Prevnar 15 then Pneumovax 23 at least 1 year apart.  Alternatively, can take Prevnar 20 without needing additional dose) Shingrix: 2 doses from 4  weeks to 6 months apart  Please check with your PCP to make sure you are up to date.   If you have signs or symptoms of an infection or start antibiotics: First, call your PCP for workup of your infection. Hold your medication through the infection, until you complete your antibiotics, and until symptoms resolve if you take the following: Injectable medication (Actemra, Benlysta, Cimzia, Cosentyx, Enbrel, Humira, Kevzara, Orencia, Remicade, Simponi, Stelara, Taltz, Tremfya) Methotrexate Leflunomide (Arava) Mycophenolate (Cellcept) Roma Kayser, or Rinvoq  Get an annual skin examination to screen for skin cancer while you are on Humira.

## 2022-08-14 LAB — COMPLETE METABOLIC PANEL WITH GFR
AG Ratio: 1.3 (calc) (ref 1.0–2.5)
ALT: 20 U/L (ref 6–29)
AST: 30 U/L (ref 10–35)
Albumin: 4.1 g/dL (ref 3.6–5.1)
Alkaline phosphatase (APISO): 120 U/L (ref 37–153)
BUN: 16 mg/dL (ref 7–25)
CO2: 31 mmol/L (ref 20–32)
Calcium: 9.2 mg/dL (ref 8.6–10.4)
Chloride: 100 mmol/L (ref 98–110)
Creat: 0.81 mg/dL (ref 0.50–1.05)
Globulin: 3.2 g/dL (calc) (ref 1.9–3.7)
Glucose, Bld: 88 mg/dL (ref 65–99)
Potassium: 4.5 mmol/L (ref 3.5–5.3)
Sodium: 139 mmol/L (ref 135–146)
Total Bilirubin: 0.3 mg/dL (ref 0.2–1.2)
Total Protein: 7.3 g/dL (ref 6.1–8.1)
eGFR: 79 mL/min/{1.73_m2} (ref >=60)

## 2022-08-14 LAB — CBC WITH DIFFERENTIAL/PLATELET
Absolute Monocytes: 835 cells/uL (ref 200–950)
Basophils Absolute: 62 cells/uL (ref 0–200)
Basophils Relative: 0.8 %
Eosinophils Absolute: 484 cells/uL (ref 15–500)
Eosinophils Relative: 6.2 %
HCT: 35 % (ref 35.0–45.0)
Hemoglobin: 11.7 g/dL (ref 11.7–15.5)
Lymphs Abs: 1903 cells/uL (ref 850–3900)
MCH: 29.8 pg (ref 27.0–33.0)
MCHC: 33.4 g/dL (ref 32.0–36.0)
MCV: 89.1 fL (ref 80.0–100.0)
MPV: 11.2 fL (ref 7.5–12.5)
Monocytes Relative: 10.7 %
Neutro Abs: 4516 cells/uL (ref 1500–7800)
Neutrophils Relative %: 57.9 %
Platelets: 288 10*3/uL (ref 140–400)
RBC: 3.93 10*6/uL (ref 3.80–5.10)
RDW: 12.7 % (ref 11.0–15.0)
Total Lymphocyte: 24.4 %
WBC: 7.8 10*3/uL (ref 3.8–10.8)

## 2022-08-14 LAB — QUANTIFERON-TB GOLD PLUS
Mitogen-NIL: >10 IU/mL
NIL: 0.05 IU/mL
QuantiFERON-TB Gold Plus: NEGATIVE
TB1-NIL: 0 IU/mL
TB2-NIL: 0.07 IU/mL

## 2022-08-15 NOTE — Progress Notes (Signed)
CBC and CMP are normal.  TB Gold is negative.

## 2022-08-16 DIAGNOSIS — E559 Vitamin D deficiency, unspecified: Secondary | ICD-10-CM | POA: Diagnosis not present

## 2022-08-16 DIAGNOSIS — I10 Essential (primary) hypertension: Secondary | ICD-10-CM | POA: Diagnosis not present

## 2022-08-16 DIAGNOSIS — R946 Abnormal results of thyroid function studies: Secondary | ICD-10-CM | POA: Diagnosis not present

## 2022-08-16 DIAGNOSIS — Z0000 Encounter for general adult medical examination without abnormal findings: Secondary | ICD-10-CM | POA: Diagnosis not present

## 2022-08-16 DIAGNOSIS — G40909 Epilepsy, unspecified, not intractable, without status epilepticus: Secondary | ICD-10-CM | POA: Diagnosis not present

## 2022-08-16 DIAGNOSIS — R7309 Other abnormal glucose: Secondary | ICD-10-CM | POA: Diagnosis not present

## 2022-08-16 DIAGNOSIS — E7800 Pure hypercholesterolemia, unspecified: Secondary | ICD-10-CM | POA: Diagnosis not present

## 2022-08-16 MED ORDER — SUMATRIPTAN SUCCINATE 6 MG/0.5ML ~~LOC~~ SOLN: 6.0000 mg | SUBCUTANEOUS | 11 refills | Status: DC | PRN

## 2022-08-16 NOTE — Telephone Encounter (Signed)
Pt states she is still waiting on the refill for her SUMAtriptan (IMITREX) 6 MG/0.5ML SOLN injection , the message from 01-03 and 01-04 were shared with pt.  She states she has still been unable to get medication , she is asking for a call for help.  Pt has had no relief without this medication.

## 2022-08-16 NOTE — Addendum Note (Signed)
Addended by: Brandon Melnick on: 08/16/2022 05:20 PM   Modules accepted: Orders

## 2022-08-16 NOTE — Telephone Encounter (Addendum)
I called pharmacy, spoke to LEE, pharmacist.  He said that 62ml was not covered BUT 21ml kits (contains 2 injections each) and she will get 12 injections for 30 days. This was verbal change.   I spoke to pt and let her know. She appreciated call.

## 2022-08-17 DIAGNOSIS — H353221 Exudative age-related macular degeneration, left eye, with active choroidal neovascularization: Secondary | ICD-10-CM | POA: Diagnosis not present

## 2022-08-17 DIAGNOSIS — Z947 Corneal transplant status: Secondary | ICD-10-CM | POA: Diagnosis not present

## 2022-08-17 DIAGNOSIS — H30033 Focal chorioretinal inflammation, peripheral, bilateral: Secondary | ICD-10-CM | POA: Diagnosis not present

## 2022-08-17 DIAGNOSIS — H401112 Primary open-angle glaucoma, right eye, moderate stage: Secondary | ICD-10-CM | POA: Diagnosis not present

## 2022-08-17 DIAGNOSIS — H401123 Primary open-angle glaucoma, left eye, severe stage: Secondary | ICD-10-CM | POA: Diagnosis not present

## 2022-08-17 DIAGNOSIS — Z79899 Other long term (current) drug therapy: Secondary | ICD-10-CM | POA: Diagnosis not present

## 2022-08-17 DIAGNOSIS — H3581 Retinal edema: Secondary | ICD-10-CM | POA: Diagnosis not present

## 2022-08-23 NOTE — Telephone Encounter (Signed)
Submitted an appeal to  [facility]  for [place] with financial documents and letter  Reference # [id number] Phone: [phone] Fax: [phone]  Knox Saliva, PharmD, MPH, BCPS, CPP Clinical Pharmacist (Rheumatology and Pulmonology)

## 2022-08-24 MED ORDER — FORTEO 600 MCG/2.4ML ~~LOC~~ SOPN: 20.0000 ug | PEN_INJECTOR | Freq: Every day | SUBCUTANEOUS | 0 refills | Status: DC

## 2022-08-24 NOTE — Telephone Encounter (Signed)
Received a fax from  [facility] regarding an approval for [place] patient assistance from 08/23/2022 to 07/19/2023. Approval letter sent to scan center.  Phone number: [phone]  Patient notified via [place].  Knox Saliva, PharmD, MPH, BCPS, CPP Clinical Pharmacist (Rheumatology and Pulmonology)

## 2022-08-25 ENCOUNTER — Telehealth: Payer: Self-pay | Admitting: Neurology

## 2022-08-25 ENCOUNTER — Telehealth: Payer: Self-pay

## 2022-08-25 NOTE — Telephone Encounter (Signed)
Attempted to contact patient and left message to advise patient that She may resume Humira and Arava 2 weeks after she is symptom-free.

## 2022-08-25 NOTE — Telephone Encounter (Signed)
She may resume Humira and Arava 2 weeks after she is symptom-free.

## 2022-08-25 NOTE — Telephone Encounter (Signed)
Patient called stating she started feeling bad on 08/21/2022, tested for COVID on 08/22/2022 and test was negative. Then tested on  08/25/2022 and the results are positive. Patient did not inject humira on schedule this week due to feeling bad. Patient states her PCP has sent in a prescription that she will start today for COVID. Patient would like to know how long to hold the humira and arava or if she can resume when symptoms resolve? Please advise.

## 2022-08-25 NOTE — Telephone Encounter (Signed)
Pt is scheduled for botox injection for 09/29/22 and will need a new PA before appointment. Current PA on file expired 07/18/22

## 2022-08-30 DIAGNOSIS — H18601 Keratoconus, unspecified, right eye: Secondary | ICD-10-CM | POA: Diagnosis not present

## 2022-08-30 DIAGNOSIS — H353221 Exudative age-related macular degeneration, left eye, with active choroidal neovascularization: Secondary | ICD-10-CM | POA: Diagnosis not present

## 2022-08-30 DIAGNOSIS — Z961 Presence of intraocular lens: Secondary | ICD-10-CM | POA: Diagnosis not present

## 2022-08-30 DIAGNOSIS — H401123 Primary open-angle glaucoma, left eye, severe stage: Secondary | ICD-10-CM | POA: Diagnosis not present

## 2022-08-30 DIAGNOSIS — H04123 Dry eye syndrome of bilateral lacrimal glands: Secondary | ICD-10-CM | POA: Diagnosis not present

## 2022-08-30 DIAGNOSIS — H02884 Meibomian gland dysfunction left upper eyelid: Secondary | ICD-10-CM | POA: Diagnosis not present

## 2022-08-30 DIAGNOSIS — H401112 Primary open-angle glaucoma, right eye, moderate stage: Secondary | ICD-10-CM | POA: Diagnosis not present

## 2022-08-30 DIAGNOSIS — Z9889 Other specified postprocedural states: Secondary | ICD-10-CM | POA: Diagnosis not present

## 2022-08-30 DIAGNOSIS — Z947 Corneal transplant status: Secondary | ICD-10-CM | POA: Diagnosis not present

## 2022-08-30 DIAGNOSIS — H30033 Focal chorioretinal inflammation, peripheral, bilateral: Secondary | ICD-10-CM | POA: Diagnosis not present

## 2022-08-30 DIAGNOSIS — H02885 Meibomian gland dysfunction left lower eyelid: Secondary | ICD-10-CM | POA: Diagnosis not present

## 2022-09-04 ENCOUNTER — Other Ambulatory Visit: Payer: Self-pay | Admitting: Neurology

## 2022-09-06 NOTE — Telephone Encounter (Signed)
Checking for update on auth, pt scheduled for 09/29/22

## 2022-09-08 ENCOUNTER — Other Ambulatory Visit (HOSPITAL_COMMUNITY): Payer: Self-pay

## 2022-09-08 NOTE — Telephone Encounter (Signed)
Benefit Verification [id number] Submitted!

## 2022-09-08 NOTE — Telephone Encounter (Signed)
Pharmacy Patient Advocate Encounter   Received notification from [facility] that prior authorization for Botox 200 units is required/requested.  PA submitted on 09/08/2022 to (ins) [employer] via [place] Status is pending

## 2022-09-10 NOTE — Telephone Encounter (Signed)
   Buy/bill

## 2022-09-27 ENCOUNTER — Other Ambulatory Visit: Payer: Self-pay | Admitting: Cardiology

## 2022-09-27 DIAGNOSIS — I6522 Occlusion and stenosis of left carotid artery: Secondary | ICD-10-CM

## 2022-09-27 DIAGNOSIS — E785 Hyperlipidemia, unspecified: Secondary | ICD-10-CM

## 2022-09-27 DIAGNOSIS — I10 Essential (primary) hypertension: Secondary | ICD-10-CM

## 2022-09-29 ENCOUNTER — Ambulatory Visit: Payer: Medicare PPO | Admitting: Neurology

## 2022-09-29 ENCOUNTER — Telehealth: Payer: Self-pay | Admitting: Neurology

## 2022-09-29 DIAGNOSIS — M542 Cervicalgia: Secondary | ICD-10-CM

## 2022-09-29 DIAGNOSIS — R270 Ataxia, unspecified: Secondary | ICD-10-CM | POA: Diagnosis not present

## 2022-09-29 DIAGNOSIS — G8929 Other chronic pain: Secondary | ICD-10-CM | POA: Diagnosis not present

## 2022-09-29 DIAGNOSIS — M4802 Spinal stenosis, cervical region: Secondary | ICD-10-CM | POA: Diagnosis not present

## 2022-09-29 DIAGNOSIS — R200 Anesthesia of skin: Secondary | ICD-10-CM | POA: Diagnosis not present

## 2022-09-29 DIAGNOSIS — M5412 Radiculopathy, cervical region: Secondary | ICD-10-CM | POA: Diagnosis not present

## 2022-09-29 DIAGNOSIS — R29898 Other symptoms and signs involving the musculoskeletal system: Secondary | ICD-10-CM

## 2022-09-29 DIAGNOSIS — R292 Abnormal reflex: Secondary | ICD-10-CM | POA: Diagnosis not present

## 2022-09-29 DIAGNOSIS — G43709 Chronic migraine without aura, not intractable, without status migrainosus: Secondary | ICD-10-CM

## 2022-09-29 MED ORDER — ONABOTULINUMTOXINA 200 UNITS IJ SOLR
155.0000 [IU] | Freq: Once | INTRAMUSCULAR | Status: AC
  Administered 2022-09-29: 155 [IU] via INTRAMUSCULAR

## 2022-09-29 NOTE — Progress Notes (Signed)
Patient with severe pain, focal on the cervical spine, she has new onset weakness in the arms, she has had pain for years, tried dry needling, been to PT for >> 6 months, failed conservative treatment, bene under the care of a physician for years, worsening, numbness in the arms, bowel problems, imbalance or falls.radiating from the neck to the shoulder down the left arm  She is here for botox but has an acute issue, severe pain, will try and get a stat MRI  Limited neuro exam: Point tenderness C7 Left deltoid and triceps 3+/5 Numbness in the thumb C/w approx c6 radiculopathy and concerning for myelopathy will send  imbalance Left possible + hoffmans Very brisk biceps/triceps  Stat mri c-spine  Orders Placed This Encounter  Procedures   MR CERVICAL SPINE WO CONTRAST   I spent 20 minutes of face-to-face and non-face-to-face time with patient on the      2. Cervical radiculopathy at C5   3. Cervical stenosis of spinal canal   4. Left arm weakness   5. Left arm numbness   6. Ataxia   7. Chronic neck pain with abnormal neurologic examination   8. Brisk deep tendon reflexes   9. Hoffman sign present    diagnosis.  This included previsit chart review, lab review, study review, order entry, electronic health record documentation, patient education on the different diagnostic and therapeutic options, counseling and coordination of care, risks and benefits of management, compliance, or risk factor reduction  diagnosis.  This included previsit chart review, lab review, study review, order entry, electronic health record documentation, patient education on the different diagnostic and therapeutic options, counseling and coordination of care, risks and benefits of management, compliance, or risk factor reduction, this does not include anything related to migraines or otox procedure.

## 2022-09-29 NOTE — Telephone Encounter (Signed)
Migraines improved > 50% freq and severity with botox. Try to get vyepti approved. She gets > 50% improvement with botox but still has >8 migraine days a month and > 15 total headache days a month.   Tried ajovy, emgality, aimovig and multiple other medications  Can we try to get Vyepti approved please? If we can go straigh to '300mg'$  would be great otherwise start at '100mg'$  then increase to '300mg'$  thanks

## 2022-09-29 NOTE — Telephone Encounter (Signed)
Vyepti order form completed and is pending Dr Cathren Laine signature.

## 2022-09-29 NOTE — Progress Notes (Signed)
09/29/2022: Migraines improved > 50% freq and severity with botox. Try to get vyepti approved. She gets > 50% improvement with botox but still has >8 migraine days a month and > 15 total headache days a month.   03/16/2022: stable; >70% improvement in frequency of migraines, Baseline 15 severemigraine days a month and daily headaches. No corrugators or procerus. Very high frontalis. Gave nurtec samples.   12/01/2021: stable  09/08/2021: stable  Interval history 06/09/2021: stable. No corrugators or procerus. Very high frontalis.  Broke her patella in a wheelchair now.>70% improvement in frequency of migraines, Baseline 15 severemigraine days a month and daily headaches.  Interval history 03/10/2021: stable. No corrugators or procerus. Very high frontalis. Rest in traps. Broke her patella in a wheelchair now.  Interval history 12/01/2020: stable  Send to Dr. Brett Fairy, intractable headches, morning headaches: Intractable headaches, wakes with headaches, has headaches when she wakes in the morning, hx of strokes: sleep evaluation  Imitrex injections: Patient has intractable chronic migraines, the best thing that is ever worked for her is Imitrex injections, we have tried multiple alternative medications including analgesics, Nurtec, Ubrelvy, Reyvow, and multiple other for second and third line acute management medications.  Patient had stroke, at that time we took her off her Imitrex, today she implore only request to have it back, she understands the risks, we did review the risks and discussed that sometimes life is about weighing the benefits versus those risks, at this time I will prescribe them for her, she is fully aware of the risk that she is taking with a history of vascular disease.  Interval history 02/12/2020: Migraines fluctuate with her eye problems, she sees cornea specialists and retina specialists and they may install long term steroid release long term in the eyes. Las botox she had only 4  migraine days a month taken care of by imitrex, significant.  >70% improvement in frequency of migraines, Baseline 15 severemigraine days a month and daily headaches. No clenching.  She absolutely loved  .Dry needling: cervical myofascial pain, Send back to dry needling - [address] for cervical myofascial pain syndrome. Gave samples for Nurtec for her to try as well acutely at last visit but she did not like it.  Tried: VPA, Topamax, antidepressants, norvasc, phenergan, frova, aimovig, ajovy, fioricet, (save 6 As), verapamil     Consent Form Botulism Toxin Injection For Chronic Migraine    Reviewed orally with patient, additionally signature is on file:  Botulism toxin has been approved by the Federal drug administration for treatment of chronic migraine. Botulism toxin does not cure chronic migraine and it may not be effective in some patients.  The administration of botulism toxin is accomplished by injecting a small amount of toxin into the muscles of the neck and head. Dosage must be titrated for each individual. Any benefits resulting from botulism toxin tend to wear off after 3 months with a repeat injection required if benefit is to be maintained. Injections are usually done every 3-4 months with maximum effect peak achieved by about 2 or 3 weeks. Botulism toxin is expensive and you should be sure of what costs you will incur resulting from the injection.  The side effects of botulism toxin use for chronic migraine may include:   -Transient, and usually mild, facial weakness with facial injections  -Transient, and usually mild, head or neck weakness with head/neck injections  -Reduction or loss of forehead facial animation due to forehead muscle weakness  -Eyelid drooping  -Dry eye  -  Pain at the site of injection or bruising at the site of injection  -Double vision  -Potential unknown long term risks  Contraindications: You should not have Botox if you are pregnant, nursing,  allergic to albumin, have an infection, skin condition, or muscle weakness at the site of the injection, or have myasthenia gravis, Lambert-Eaton syndrome, or ALS.  It is also possible that as with any injection, there may be an allergic reaction or no effect from the medication. Reduced effectiveness after repeated injections is sometimes seen and rarely infection at the injection site may occur. All care will be taken to prevent these side effects. If therapy is given over a long time, atrophy and wasting in the muscle injected may occur. Occasionally the patient's become refractory to treatment because they develop antibodies to the toxin. In this event, therapy needs to be modified.  I have read the above information and consent to the administration of botulism toxin.    BOTOX PROCEDURE NOTE FOR MIGRAINE HEADACHE    Contraindications and precautions discussed with patient(above). Aseptic procedure was observed and patient tolerated procedure. Procedure performed by Dr. Georgia Dom  The condition has existed for more than 6 months, and pt does not have a diagnosis of ALS, Myasthenia Gravis or Lambert-Eaton Syndrome.  Risks and benefits of injections discussed and pt agrees to proceed with the procedure.  Written consent obtained  These injections are medically necessary. Pt  receives good benefits from these injections. These injections do not cause sedations or hallucinations which the oral therapies may cause.  Description of procedure:  The patient was placed in a sitting position. The standard protocol was used for Botox as follows, with 5 units of Botox injected at each site:   -Procerus muscle, midline injection DID NOT PERFORM  -Corrugator muscle, bilateral injection DID NOT PERFORM  -Frontalis muscle, bilateral injection, with 2 sites each side, medial injection was performed in the upper one third of the frontalis muscle, in the region vertical from the medial inferior edge of  the superior orbital rim. The lateral injection was again in the upper one third of the forehead vertically above the lateral limbus of the cornea, 1.5 cm lateral to the medial injection site.  -Temporalis muscle injection, 4 sites, bilaterally. The first injection was 3 cm above the tragus of the ear, second injection site was 1.5 cm to 3 cm up from the first injection site in line with the tragus of the ear. The third injection site was 1.5-3 cm forward between the first 2 injection sites. The fourth injection site was 1.5 cm posterior to the second injection site.   -Occipitalis muscle injection, 3 sites, bilaterally. The first injection was done one half way between the occipital protuberance and the tip of the mastoid process behind the ear. The second injection site was done lateral and superior to the first, 1 fingerbreadth from the first injection. The third injection site was 1 fingerbreadth superiorly and medially from the first injection site.  -Cervical paraspinal muscle injection, 2 sites, bilateral knee first injection site was 1 cm from the midline of the cervical spine, 3 cm inferior to the lower border of the occipital protuberance. The second injection site was 1.5 cm superiorly and laterally to the first injection site.  -Trapezius muscle injection was performed at 3 sites, bilaterally. The first injection site was in the upper trapezius muscle halfway between the inflection point of the neck, and the acromion. The second injection site was one half way  between the acromion and the first injection site. The third injection was done between the first injection site and the inflection point of the neck.   Will return for repeat injection in 3 months.   140 units of Botox was used, 60U Botox not injected was wasted. The patient tolerated the procedure well, there were no complications of the above procedure.

## 2022-09-29 NOTE — Progress Notes (Signed)
Botox- 200 units x 1 vial Lot: [id number] Expiration: 12/2024 NDC: [id number]   Bacteriostatic 0.9% Sodium Chloride- 3m total Lot: [id number]: 05/2024 NDC: [id number]  Dx: [id number] Witnessed by CGerline Legacy RN

## 2022-09-29 NOTE — Telephone Encounter (Signed)
Mcarthur Rossetti Josem Kaufmann: [id number] exp. 09/29/22-10/29/22 sent to [place]

## 2022-09-30 NOTE — Telephone Encounter (Signed)
Spoke w/ Brianna [name] in [place]. Insurance won't cover 300 mg to start with so we would have to order 100 mg. Brianna [name] also will be about $400 for first infusion followed by about $360 for subsequent infusions. I spoke with the patient. She does not want to proceed with Vyepti right now but may try next year. She was appreciative for the call. She stated her MRI will be on Saturday.

## 2022-09-30 NOTE — Telephone Encounter (Signed)
Vyepti 300 mg order signed by Dr Jaynee Eagles, then given to [facility].

## 2022-10-02 ENCOUNTER — Ambulatory Visit (HOSPITAL_BASED_OUTPATIENT_CLINIC_OR_DEPARTMENT_OTHER)
Admission: RE | Admit: 2022-10-02 | Discharge: 2022-10-02 | Disposition: A | Discharge status: Home / Self Care | Payer: Medicare PPO | Source: Ambulatory Visit | Attending: Neurology | Admitting: Neurology

## 2022-10-02 DIAGNOSIS — R29898 Other symptoms and signs involving the musculoskeletal system: Secondary | ICD-10-CM | POA: Diagnosis not present

## 2022-10-02 DIAGNOSIS — M4802 Spinal stenosis, cervical region: Secondary | ICD-10-CM | POA: Insufficient documentation

## 2022-10-02 DIAGNOSIS — R200 Anesthesia of skin: Secondary | ICD-10-CM | POA: Insufficient documentation

## 2022-10-02 DIAGNOSIS — M4312 Spondylolisthesis, cervical region: Secondary | ICD-10-CM | POA: Diagnosis not present

## 2022-10-02 DIAGNOSIS — M5412 Radiculopathy, cervical region: Secondary | ICD-10-CM | POA: Insufficient documentation

## 2022-10-02 DIAGNOSIS — G8929 Other chronic pain: Secondary | ICD-10-CM | POA: Insufficient documentation

## 2022-10-02 DIAGNOSIS — M542 Cervicalgia: Secondary | ICD-10-CM | POA: Insufficient documentation

## 2022-10-02 DIAGNOSIS — R270 Ataxia, unspecified: Secondary | ICD-10-CM | POA: Insufficient documentation

## 2022-10-02 DIAGNOSIS — R292 Abnormal reflex: Secondary | ICD-10-CM | POA: Diagnosis not present

## 2022-10-04 ENCOUNTER — Other Ambulatory Visit: Payer: Self-pay | Admitting: *Deleted

## 2022-10-04 DIAGNOSIS — I6523 Occlusion and stenosis of bilateral carotid arteries: Secondary | ICD-10-CM

## 2022-10-05 ENCOUNTER — Telehealth: Payer: Self-pay | Admitting: Neurology

## 2022-10-05 DIAGNOSIS — M4802 Spinal stenosis, cervical region: Secondary | ICD-10-CM

## 2022-10-05 DIAGNOSIS — M4800 Spinal stenosis, site unspecified: Secondary | ICD-10-CM

## 2022-10-05 NOTE — Addendum Note (Signed)
Addended by: Gildardo Griffes on: 10/05/2022 09:32 AM   Modules accepted: Orders

## 2022-10-05 NOTE — Telephone Encounter (Signed)
Her cervical spine shows central canal stenosis and squeezing of the spinal cord she needs to see neurosurgery we can send to Dr. Reatha Armour:   Multilevel degenerative changes throughout the cervical spine, moderate spinal canal stenosis from C2-C3 through C5-C6 resulting in flattening of the cord and mildly increased cord signal suggesting myelomalacia.  Please let her know if she agrees we can send a refferal, send one or let me know thanks

## 2022-10-05 NOTE — Telephone Encounter (Signed)
Spoke with patient and discussed MRI results as noted below. Patient would like to proceed with the neurosurgery referral. Pt's questions were answered. She verbalized appreciation for the call and will await a call from neurosurgery to schedule the consultation.   Referral placed for Dr Reatha Armour.

## 2022-10-06 ENCOUNTER — Telehealth: Payer: Self-pay | Admitting: Neurology

## 2022-10-06 NOTE — Telephone Encounter (Signed)
Referral for neurosurgery fax to [facility]. Phone: [phone], Fax: [phone] 

## 2022-10-08 DIAGNOSIS — M542 Cervicalgia: Secondary | ICD-10-CM | POA: Diagnosis not present

## 2022-10-13 ENCOUNTER — Other Ambulatory Visit (HOSPITAL_COMMUNITY): Payer: Self-pay | Admitting: Neurological Surgery

## 2022-10-13 DIAGNOSIS — M542 Cervicalgia: Secondary | ICD-10-CM

## 2022-10-20 ENCOUNTER — Ambulatory Visit: Payer: Medicare PPO | Admitting: Physician Assistant

## 2022-10-20 ENCOUNTER — Ambulatory Visit (HOSPITAL_COMMUNITY)
Admission: RE | Admit: 2022-10-20 | Discharge: 2022-10-20 | Disposition: A | Discharge status: Home / Self Care | Payer: Medicare PPO | Source: Ambulatory Visit | Attending: Vascular Surgery | Admitting: Vascular Surgery

## 2022-10-20 ENCOUNTER — Encounter: Payer: Self-pay | Admitting: Physician Assistant

## 2022-10-20 VITALS — BP 127/83 | HR 78 | Temp 98.2°F | Resp 20 | Ht 64.0 in | Wt 114.3 lb

## 2022-10-20 DIAGNOSIS — I6523 Occlusion and stenosis of bilateral carotid arteries: Secondary | ICD-10-CM

## 2022-10-20 NOTE — Progress Notes (Signed)
HISTORY AND PHYSICAL     CC:  follow up. Requesting Provider:  [name], DO  HPI: This is a 72 y.o. female here for follow up for carotid artery stenosis.  Pt is s/p left CEA for symptomatic carotid artery stenosis on 11/15/2007 by Dr. Amedeo Plenty.   She has hx of TIA previous to CEA with sx of expressive aphasia, right hemiparesis, HA and transient left monocular vision loss.     Pt was last seen 09/02/2021 and at that time she was not having any neurological sx.  She was having issues with GERD/acid reflux and was continuing evaluation with GI.    Pt returns today for follow up.    Pt denies any amaurosis fugax, speech difficulties, unilateral weakness, numbness, paralysis or clumsiness or facial droop.    She states that she is seeing neurology and neurosurgery about cervical disk issues and she has a CT scan scheduled for Thursday for further evaluation.   She is compliant with her medications.    The pt is on a statin for cholesterol management.  The pt is on a daily aspirin.   Other AC:  none The pt is on ARB for hypertension.   The pt is  on medication for diabetes Tobacco hx:  former  Her mother had hx of AAA and she tested for AAA with u/s and it measured 2.7cm in January 2020.   Past Medical History:  Diagnosis Date   Arthritis    in the neck   Carotid artery occlusion    Epilepsy ([place])    GERD (gastroesophageal reflux disease)    Glaucoma    Hyperlipidemia    Hypertension    Keratoconus of both eyes 1981   Macular degeneration    Migraines    Peripheral vascular disease ([place])    carotid occlusion surgery on left   Raynaud's disease    Retinal edema    Rheumatoid arthritis(714.0)    Stroke ([place])    two mild strokes presumed from left carotid stenosis    Past Surgical History:  Procedure Laterality Date   CAROTID ARTERY ANGIOPLASTY  2008   Dr Amedeo Plenty   CAROTID ENDARTERECTOMY Left 11-15-2007   cataract extraction Right 04-2014   [facility] [facility]     x 5 ; steroid inj. 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   [facility]   CORNEAL TRANSPLANT Left 04/17/2019   EYE SURGERY Right 12/2016   cornea repair    EYE SURGERY Right 09/20/2017   cornea transplant    GLAUCOMA SURGERY Right 2018   ORIF PATELLA Left 02/12/2021   Procedure: OPEN REDUCTION INTERNAL (ORIF) FIXATION PATELLA;  Surgeon: Nicholes Stairs, MD;  Location: [facility];  Service: Orthopedics;  Laterality: Left;   TRIGGER FINGER RELEASE Right 05/16/2018   Procedure: RELEASE TRIGGER FINGER/A-1 PULLEY RIGHT THUMB;  Surgeon: Daryll Brod, MD;  Location: [facility];  Service: Orthopedics;  Laterality: Right;    Allergies  Allergen Reactions   Morphine Other (See Comments)    Difficulty breathing   Morphine And Related Shortness Of Breath   Alphagan [Brimonidine]     Eyelid swelling, scratching Allergic to preservative contained in this med   Codeine Nausea And Vomiting   Dorzolamide Hcl-Timolol Mal     Inflammation of the eyelid, scratching Allergic to preservative contained in this med   Hydrocodone-Acetaminophen Nausea And Vomiting    But tolerates tylenol   Sulfa Antibiotics Rash    "  Large bumps"   Sulfamethoxazole Rash    Current Outpatient Medications  Medication Sig Dispense Refill   ascorbic Acid (VITAMIN C) 500 MG CPCR Take 500 mg by mouth daily.     aspirin 325 MG tablet Take 325 mg by mouth daily.     botulinum toxin Type A (BOTOX) 100 units SOLR injection Inject IM into the head and neck muscles every 3 months by provider in office 2 vial 3   butalbital-acetaminophen-caffeine (FIORICET, ESGIC) 50-325-40 MG tablet Take 1 tablet by mouth every 6 (six) hours as needed for headache. 10 tablet 4   Calcium Carbonate-Vit D-Min (CALCIUM 1200 PO) Take 2 capsules by mouth as needed.     chlorthalidone (HYGROTON) 25 MG tablet Take 1 tablet (25 mg total) by mouth daily. 30 tablet 11   Cholecalciferol  (VITAMIN D PO) Take 1,000 Units by mouth daily.      DILANTIN 100 MG ER capsule TAKE 3 CAPSULES BY MOUTH DAILY 270 capsule 3   Evolocumab (REPATHA SURECLICK) [id number] MG/ML SOAJ INJECT 1 PEN UNDER THE SKIN EVERY 14 DAYS 6 mL 1   ezetimibe (ZETIA) 10 MG tablet Take 1 tablet (10 mg total) by mouth daily. 30 tablet 11   FORTEO 600 MCG/2.4ML SOPN Inject 20 mcg into the skin daily. Discard pen after 28 days of use. 9.6 mL 0   HUMIRA PEN 40 MG/0.4ML PNKT Inject 40 mg into the skin every 14 (fourteen) days. As directed     Insulin Pen Needle 31G X 5 MM MISC Use to inject Forteo once daily. DO NOT REUSE. Forteo received through patient assistance 100 each 2   leflunomide (ARAVA) 20 MG tablet Take 20 mg by mouth daily.     LORazepam (ATIVAN) 0.5 MG tablet Take 0.5 mg by mouth daily as needed for anxiety or sleep.     losartan (COZAAR) 100 MG tablet Take 1 tablet (100 mg total) by mouth daily. 30 tablet 11   pantoprazole (PROTONIX) 40 MG tablet TAKE 1 TABLET(40 MG) BY MOUTH DAILY 90 tablet 3   promethazine (PHENERGAN) 25 MG tablet Take 1 tablet (25 mg total) by mouth every 6 (six) hours as needed for nausea or vomiting. 30 tablet 11   RESTASIS 0.05 % ophthalmic emulsion 2 (two) times daily.     Rimegepant Sulfate (NURTEC) 75 MG TBDP Take 75 mg by mouth daily as needed. For migraines. Take as close to onset of migraine as possible. One daily maximum. 8 tablet 0   rosuvastatin (CRESTOR) 20 MG tablet TAKE 1 TABLET(20 MG) BY MOUTH DAILY. PLEASE MAKE OVERDUE APPOINTMENT WITH DOCTOR SKAINS BEFORE ANYMORE REFILLS. THANK YOU FIRST ATTEMPT 30 tablet 3   spironolactone (ALDACTONE) 25 MG tablet Take 0.5 tablets (12.5 mg total) by mouth daily. 45 tablet 3   SUMAtriptan (IMITREX) 6 MG/0.5ML SOLN injection Inject 0.5 mLs (6 mg total) into the skin every 2 (two) hours as needed for migraine or headache. Take one dose at headache onset, can take additional dose 2hrs later if needed. No more then 2 injections in 24hrs (each kit  contains 2 injections) 6 mL 11   tacrolimus (PROGRAF) 1 MG capsule Take 1 mg by mouth daily.     Current Facility-Administered Medications  Medication Dose Route Frequency Provider Last Rate Last Admin   0.9 %  sodium chloride infusion   Intravenous PRN Eileen Stanford, PA-C        Family History  Problem Relation Age of Onset   Heart disease Mother  Lung disease Mother        ? disease process   Uterine cancer Mother    Heart disease Father    Clotting disorder Father    Collagen disease Father    Cervical cancer Maternal Aunt    Prostate cancer Maternal Grandfather    Rheum arthritis Sister    High Cholesterol Sister    Epilepsy Sister    Rheum arthritis Maternal Grandmother    High Cholesterol Brother    High blood pressure Brother    High blood pressure Brother    High Cholesterol Brother    High blood pressure Brother    High Cholesterol Brother     Social History   Socioeconomic History   Marital status: Married    Spouse name: Thayer Jew   Number of children: 2   Years of education: Bachelor   Highest education level: Bachelor's degree (e.g., BA, AB, BS)  Occupational History   Occupation: Pharmacist, hospital   Occupation: AG TEACHER    Employer: [employer]    Comment: retired  Tobacco Use   Smoking status: Former    Packs/day: 0.50    Years: 14.00    Additional pack years: 0.00    Total pack years: 7.00    Types: Cigarettes    Quit date: 1989    Years since quitting: 35.2    Passive exposure: Never   Smokeless tobacco: Never  Vaping Use   Vaping Use: Never used  Substance and Sexual Activity   Alcohol use: No   Drug use: No   Sexual activity: Not on file  Other Topics Concern   Not on file  Social History Narrative   Patient is married to Thayer Jew), has 2 children   Patient is right handed   Education level is Water quality scientist degree   Caffeine consumption is about 2 cups daily   Lives at home with husband    Social Determinants of Adult nurse Strain: Not on file  Food Insecurity: Not on file  Transportation Needs: Not on file  Physical Activity: Not on file  Stress: Not on file  Social Connections: Not on file  Intimate Partner Violence: Not on file     REVIEW OF SYSTEMS:   [X]  denotes positive finding, [ ]  denotes negative finding Cardiac  Comments:  Chest pain or chest pressure:    Shortness of breath upon exertion:    Short of breath when lying flat:    Irregular heart rhythm:        Vascular    Pain in calf, thigh, or hip brought on by ambulation:    Pain in feet at night that wakes you up from your sleep:     Blood clot in your veins:    Leg swelling:         Pulmonary    Oxygen at home:    Productive cough:     Wheezing:         Neurologic     weakness in arms or legs:  x See HPI  Sudden numbness in arms or legs:     Sudden onset of difficulty speaking or slurred speech:    Temporary loss of vision in one eye:     Problems with dizziness:         Gastrointestinal    Blood in stool:     Vomited blood:         Genitourinary    Burning when urinating:     Blood  in urine:        Psychiatric    Major depression:         Hematologic    Bleeding problems:    Problems with blood clotting too easily:        Skin    Rashes or ulcers:        Constitutional    Fever or chills:      PHYSICAL EXAMINATION:  Today's Vitals   10/20/22 1223 10/20/22 1225  BP: 134/79 127/83  Pulse: 78   Resp: 20   Temp: 98.2 F (36.8 C)   TempSrc: Temporal   SpO2: 98%   Weight: 114 lb 4.8 oz (51.8 kg)   Height: 5\' 4"  (1.626 m)    Body mass index is 19.62 kg/m.   General:  WDWN in NAD; vital signs documented above Gait: Not observed HENT: WNL, normocephalic Pulmonary: normal non-labored breathing Cardiac: regular HR, without carotid bruits Abdomen: soft, NT; aortic pulse is not palpable Skin: without rashes; well healed left neck scar. Vascular Exam/Pulses:  Right Left  Radial  2+ (normal) 2+ (normal)  DP 2+ (normal) 2+ (normal)  PT 2+ (normal) 2+ (normal)   Extremities: without open wounds Musculoskeletal: no muscle wasting or atrophy  Neurologic: A&O X 3; moving all extremities equally; speech is fluent/normal Psychiatric:  The pt has Normal affect.   Non-Invasive Vascular Imaging:   Carotid Duplex on 10/20/2022 Right:  1-39% ICA stenosis Left:  1-39% ICA stenosis Vertebrals:  Bilateral vertebral arteries demonstrate antegrade flow.  Subclavians: Normal flow hemodynamics were seen in bilateral subclavian arteries   Previous Carotid duplex on 09/02/2021: Right: 1-39% ICA stenosis Left:   1-39% ICA stenosis    ASSESSMENT/PLAN:: 72 y.o. female here for follow up carotid artery stenosis and hx of left CEA for symptomatic carotid artery stenosis on 11/15/2007 by Dr. Amedeo Plenty.   She has hx of TIA previous to CEA with sx of expressive aphasia, right hemiparesis, HA and transient left monocular vision loss.     -duplex today reveals bilateral ICA stenosis remains 1-39% stenosis.  She does have some weakness in her lower extremities but most likely coming from her back issues.  -discussed s/s of stroke with pt and she understands should she develop any of these sx, she will go to the nearest ER or call 911. -pt will f/u in one year with carotid duplex -pt will call sooner should she have any issues. -continue statin/asa.  Ok to stop asa if she needs neurosurgery and restart when okay with them.    Leontine Locket, [facility] [phone]  Clinic MD:  Donzetta Matters

## 2022-10-21 ENCOUNTER — Ambulatory Visit (HOSPITAL_BASED_OUTPATIENT_CLINIC_OR_DEPARTMENT_OTHER)
Admission: RE | Admit: 2022-10-21 | Discharge: 2022-10-21 | Disposition: A | Discharge status: Home / Self Care | Payer: Medicare PPO | Source: Ambulatory Visit | Attending: Neurological Surgery | Admitting: Neurological Surgery

## 2022-10-21 DIAGNOSIS — M542 Cervicalgia: Secondary | ICD-10-CM | POA: Insufficient documentation

## 2022-10-21 DIAGNOSIS — M4312 Spondylolisthesis, cervical region: Secondary | ICD-10-CM | POA: Diagnosis not present

## 2022-10-21 DIAGNOSIS — R200 Anesthesia of skin: Secondary | ICD-10-CM | POA: Diagnosis not present

## 2022-11-02 DIAGNOSIS — M542 Cervicalgia: Secondary | ICD-10-CM | POA: Diagnosis not present

## 2022-11-03 ENCOUNTER — Other Ambulatory Visit: Payer: Self-pay | Admitting: Internal Medicine

## 2022-11-04 DIAGNOSIS — E782 Mixed hyperlipidemia: Secondary | ICD-10-CM | POA: Diagnosis not present

## 2022-11-04 DIAGNOSIS — I10 Essential (primary) hypertension: Secondary | ICD-10-CM | POA: Diagnosis not present

## 2022-11-04 DIAGNOSIS — K219 Gastro-esophageal reflux disease without esophagitis: Secondary | ICD-10-CM | POA: Diagnosis not present

## 2022-11-04 DIAGNOSIS — K5730 Diverticulosis of large intestine without perforation or abscess without bleeding: Secondary | ICD-10-CM | POA: Diagnosis not present

## 2022-11-05 DIAGNOSIS — H401112 Primary open-angle glaucoma, right eye, moderate stage: Secondary | ICD-10-CM | POA: Diagnosis not present

## 2022-11-05 DIAGNOSIS — H18601 Keratoconus, unspecified, right eye: Secondary | ICD-10-CM | POA: Diagnosis not present

## 2022-11-05 DIAGNOSIS — Z9889 Other specified postprocedural states: Secondary | ICD-10-CM | POA: Diagnosis not present

## 2022-11-05 DIAGNOSIS — H401123 Primary open-angle glaucoma, left eye, severe stage: Secondary | ICD-10-CM | POA: Diagnosis not present

## 2022-11-05 DIAGNOSIS — Z961 Presence of intraocular lens: Secondary | ICD-10-CM | POA: Diagnosis not present

## 2022-11-05 DIAGNOSIS — Z947 Corneal transplant status: Secondary | ICD-10-CM | POA: Diagnosis not present

## 2022-11-05 DIAGNOSIS — H04123 Dry eye syndrome of bilateral lacrimal glands: Secondary | ICD-10-CM | POA: Diagnosis not present

## 2022-11-05 DIAGNOSIS — H353221 Exudative age-related macular degeneration, left eye, with active choroidal neovascularization: Secondary | ICD-10-CM | POA: Diagnosis not present

## 2022-11-05 DIAGNOSIS — H30033 Focal chorioretinal inflammation, peripheral, bilateral: Secondary | ICD-10-CM | POA: Diagnosis not present

## 2022-11-11 DIAGNOSIS — H401112 Primary open-angle glaucoma, right eye, moderate stage: Secondary | ICD-10-CM | POA: Diagnosis not present

## 2022-11-11 DIAGNOSIS — H401123 Primary open-angle glaucoma, left eye, severe stage: Secondary | ICD-10-CM | POA: Diagnosis not present

## 2022-11-17 ENCOUNTER — Other Ambulatory Visit: Payer: Self-pay | Admitting: Cardiology

## 2022-11-17 DIAGNOSIS — I10 Essential (primary) hypertension: Secondary | ICD-10-CM

## 2022-11-17 NOTE — Progress Notes (Signed)
Subjective:     Patient ID: Brianna [name], female   DOB: 1950-09-21     MRN: [id number]    Brief patient profile:  72 yowf  quit smoking age 72 with no resp problems then @  36 bad pneumonia.  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   No sign doe but not that active.   Sleeping ok without nocturnal  or early am exac of resp c/o's or need for noct saba.  rec Ok to restart boniva the first of September and every month but if your respiratory or reflux symptoms worsen it's first medication I would stop and take in it's place Reclast IV yearly.    11/13/2013 f/u ov/Brianna [name] re:  PF assoc with RA Chief Complaint  Patient presents with   Followup with PFT    Pt states that her breathing is unchanged since her last visit. No new co's today.    can now do 1st to 2nd floor s sob and working out regularly s limits due to sob rec  Return in one year for pfts and cxr and call sooner if not decline in tolerance > did not return     04/22/2017  [employer] [facility] office visit/ Brianna [name]  / re-establish re RA assoc ILD  Chief Complaint  Patient presents with   Pulmonary Consult    Self referral for cough x 3-4 months. She is coughing up white to yellow sputum.    joints doing great on humira but says "RA attacking both eyes " Has had more gerd attributed to "too much coffee" while on fosfamax x 2 years just on otc ppi prn  Cough onset early summer 2018  insidious/ persistent usually comes on first thing in am and sometimes wakes her up assoc with nasal congestion severe cough to point where she can't catch her breath  but then does fine on gxt x 3.6  mph at 5-6 grades if not coughing.  rec Augmentin 875 mg take one pill twice daily  X 10 days - take at breakfast and supper with large glass of water.  It would help reduce the usual side effects (diarrhea and yeast infections) if you ate cultured yogurt at lunch.  Prednisone 10 mg take  4 each am x 2 days,   2 each am x 2 days,  1 each am x 2 days and stop  Stop fosfamax for now  Pantoprazole (protonix) 40 mg   Take  30-60 min before first meal of the day and Pepcid (famotidine)  20 mg one @  bedtime until return to office - this is the best way to tell whether stomach acid is contributing to your problem.   GERD diet           09/30/2020  f/u ov/Brianna [name] re: pnds not able to take any antihistamines  Chief Complaint  Patient presents with   Follow-up    Cough is unchanged since there last visit. She has prod cough with yellow to clear sputum in the mornings.   Dyspnea: walking dog brisk pace/  up hills no more cps / no desats when checks but this is usually p ex  Cough: worse in am's x 3 years / can't take any inhistamines / assoc with pnds Sleeping: 30 degrees electric bed  comfortable SABA use: none 02: none  Covid status:   vax x 3  Rec Flonase twice daily each nostril  If not better :  I emphasized that nasal steroids(flonase)  have no immediate benefit in terms of improving symptoms If still not better > see you ENT next and let him know about your CT sinus Make sure you check your oxygen saturation at your highest level of activity to be sure it stays over 90%     ENT ov 03/24/22 for loss of taste with nl exam x for throat clearing rec rx for GERD and note says pt unsure about meds    05/20/2022  f/u ov/Brianna [name] re: RA lung dz   maint on protonix now at 40 / pepcid 20 mg s heart burn  any more   Chief Complaint  Patient presents with   Follow-up   Dyspnea: somewhat  limited by eye site /walking dog uphills./ not checking sats  Cough: just throat  clearing - no excess am mucus  Sleeping: 30 degrees  SABA use: none 02: none Covid status:   vax x 2  Lung cancer screening :  none   Rec We will walk you today to compare to priors  To get the most out of exercise, you need to be continuously aware that you are short of breath, Make sure you check your oxygen saturations at highest level of activity  If can't stop clearing throat,  return to ENT with your acid/reflux medication in hand or see Dr Barnie Mort   Please schedule a follow up visit in 6 months but call sooner if needed     11/18/2022  f/u ov/Brianna [name] re: RA lung dz    maint on protonix per DR Loreta Ave also upped pepcid 40 mg bid  Chief Complaint  Patient presents with   Follow-up    C/o allergies causing runny nose and nasal congestion.  Some cough.    Dyspnea:  no change / up hill walking dogs not consistently Cough: minimal / assoc pnds no longer on flonase Sleeping: 30 degree electric bed no resp cc  SABA use: none  02: none      No obvious day to day or daytime variability or assoc excess/ purulent sputum or mucus plugs or hemoptysis or cp or chest tightness, subjective wheeze or overt sinus or hb symptoms.   Sleeping  without nocturnal  or early am exacerbation  of respiratory  c/o's or need for noct saba. Also denies any obvious fluctuation of symptoms with weather or environmental changes or other aggravating or alleviating factors except as outlined above   No unusual exposure hx or h/o childhood pna/ asthma or knowledge of premature birth.  Current Allergies, Complete Past Medical History, Past Surgical History, Family History, and Social History were reviewed in [employer] record.  ROS  The following are not active complaints unless bolded Hoarseness, sore throat, dysphagia, dental problems, itching, sneezing,  nasal congestion or discharge of excess mucus or purulent secretions, ear ache,   fever, chills, sweats, unintended wt loss or wt  gain, classically pleuritic or exertional cp,  orthopnea pnd or arm/hand swelling  or leg swelling, presyncope, palpitations, abdominal pain, anorexia, nausea, vomiting, diarrhea  or change in bowel habits or change  in bladder habits, change in stools or change in urine, dysuria, hematuria,  rash, arthralgias, visual complaints, headache, numbness, weakness or ataxia or problems with walking or coordination,  change in mood or  memory.        Current Meds  Medication Sig   ascorbic Acid (VITAMIN C) 500 MG CPCR Take 500 mg by mouth daily.   aspirin 325 MG tablet Take 325 mg by mouth daily.   botulinum toxin Type A (BOTOX) 100 units SOLR injection Inject IM into the head and neck muscles every 3 months by provider in office   butalbital-acetaminophen-caffeine (FIORICET, ESGIC) 50-325-40 MG tablet Take 1 tablet by mouth every 6 (six) hours as needed for headache.   Calcium Carbonate-Vit D-Min (CALCIUM 1200 PO) Take 2 capsules by mouth as needed.   chlorthalidone (HYGROTON) 25 MG tablet Take 1 tablet (25 mg total) by mouth daily.   Cholecalciferol (VITAMIN D PO) Take 1,000 Units by mouth daily.    Difluprednate 0.05 % EMUL Place 1 drop into the left eye in the morning, at noon, in the evening, and at bedtime.   DILANTIN 100 MG ER capsule TAKE 3 CAPSULES BY MOUTH DAILY   Evolocumab (REPATHA SURECLICK) 140 MG/ML SOAJ INJECT 1 PEN UNDER THE SKIN EVERY 14 DAYS   ezetimibe (ZETIA) 10 MG tablet Take 1 tablet (10 mg total) by mouth daily.   FORTEO 600 MCG/2.4ML SOPN Inject 20 mcg into the skin daily. Discard pen after 28 days of use.   HUMIRA PEN 40 MG/0.4ML PNKT Inject 40 mg into the skin every 14 (fourteen) days. As directed   Insulin Pen Needle 31G X 5 MM MISC Use to inject Forteo once daily. DO NOT REUSE. Forteo received through patient assistance   leflunomide (ARAVA) 20 MG tablet Take 20 mg by mouth daily.   LORazepam (ATIVAN) 0.5 MG tablet Take 0.5 mg by mouth daily as needed for anxiety or sleep.    losartan (COZAAR) 100 MG tablet Take 1 tablet (100 mg total) by mouth daily.   pantoprazole (PROTONIX) 40 MG tablet TAKE 1 TABLET(40 MG) BY MOUTH DAILY   promethazine (PHENERGAN) 25 MG tablet Take 1 tablet (25 mg total) by mouth every 6 (six) hours as needed for nausea or vomiting.   RESTASIS 0.05 % ophthalmic emulsion 2 (two) times daily.   Rimegepant Sulfate (NURTEC) 75 MG TBDP Take 75 mg by mouth daily as needed. For migraines. Take as close to onset of migraine as possible. One daily maximum.   rosuvastatin (CRESTOR) 20 MG tablet TAKE 1 TABLET(20 MG) BY MOUTH DAILY. PLEASE MAKE OVERDUE APPOINTMENT WITH DOCTOR SKAINS BEFORE ANYMORE REFILLS. THANK YOU FIRST ATTEMPT   spironolactone (ALDACTONE) 25 MG tablet Take 0.5 tablets (12.5 mg total) by mouth daily.   SUMAtriptan (IMITREX) 6 MG/0.5ML SOLN injection Inject 0.5 mLs (6 mg total) into the skin every 2 (two) hours as needed for migraine or headache. Take one dose at headache onset, can take additional dose 2hrs later if needed. No more then 2 injections in 24hrs (each kit contains 2 injections)   tacrolimus (PROGRAF) 1 MG capsule Take 1 mg by mouth daily.   Current Facility-Administered Medications for the 11/18/22 encounter (Office Visit) with Nyoka Cowden, MD  Medication   0.9 %  sodium chloride infusion             Objective:   Physical Exam   wts   11/18/2022      114  05/20/2022    115  09/22/2021  113  09/30/2020    118 06/23/2020    122  06/22/2019  128  06/21/2018    126   Wt 130 01/14/2011 > 08/27/2013  126 >  11/13/2013 125 > 04/22/2017  125 > 06/14/2017   127 > 10/11/2017  124 >    Vital signs reviewed  11/18/2022  - Note at rest 02 sats  96% on RA    General appearance:    pleasant amb wf nad    HEENT : Oropharynx  clear         NECK :  without  apparent JVD/ palpable Nodes/TM    LUNGS: no acc muscle use,  Nl contour chest with minimal insp crackles in bases s cough on insp    CV:  RRR  no s3 or murmur or increase in  P2, and no edema   ABD:  soft and nontender     MS:  Nl gait/ ext warm without deformities Or obvious joint restrictions  calf tenderness, cyanosis or clubbing    SKIN: warm and dry without lesions    NEURO:  alert, approp, nl sensorium with  no motor or cerebellar deficits apparent.     CXR PA and Lateral:   11/18/2022 :    I personally reviewed images and impression is as follows:     No change from priors /confirmed by radioloy 11/22/22        Assessment:

## 2022-11-18 ENCOUNTER — Other Ambulatory Visit: Payer: Self-pay

## 2022-11-18 ENCOUNTER — Ambulatory Visit (INDEPENDENT_AMBULATORY_CARE_PROVIDER_SITE_OTHER): Payer: Medicare PPO

## 2022-11-18 ENCOUNTER — Encounter: Payer: Self-pay | Admitting: Internal Medicine

## 2022-11-18 ENCOUNTER — Ambulatory Visit: Payer: Medicare PPO | Admitting: Internal Medicine

## 2022-11-18 VITALS — BP 138/74 | HR 76 | Temp 98.4°F | Ht 62.0 in | Wt 114.6 lb

## 2022-11-18 DIAGNOSIS — J849 Interstitial pulmonary disease, unspecified: Secondary | ICD-10-CM | POA: Diagnosis not present

## 2022-11-18 DIAGNOSIS — E785 Hyperlipidemia, unspecified: Secondary | ICD-10-CM

## 2022-11-18 DIAGNOSIS — R918 Other nonspecific abnormal finding of lung field: Secondary | ICD-10-CM | POA: Diagnosis not present

## 2022-11-18 DIAGNOSIS — J8410 Pulmonary fibrosis, unspecified: Secondary | ICD-10-CM

## 2022-11-18 MED ORDER — EZETIMIBE 10 MG PO TABS
10.0000 mg | ORAL_TABLET | Freq: Every day | ORAL | 0 refills | Status: DC
Start: 2022-11-18 — End: 2024-03-06

## 2022-11-18 NOTE — Patient Instructions (Signed)
Please remember to go to the  x-ray department  for your tests - we will call you with the results when they are available    Make sure you check your oxygen saturation at your highest level of activity(NOT after you stop)  to be sure it stays over 90% and keep track of it at least once a week, more often if breathing getting worse, and let me know if losing ground. (Collect the dots to connect the dots approach)    Please schedule a follow up visit in 12 months but call sooner if needed

## 2022-11-18 NOTE — Telephone Encounter (Signed)
Pt's medication was sent to pt's pharmacy as requested. Confirmation received.  °

## 2022-12-02 ENCOUNTER — Other Ambulatory Visit: Payer: Self-pay | Admitting: Cardiology

## 2022-12-06 ENCOUNTER — Other Ambulatory Visit: Payer: Self-pay | Admitting: Physician Assistant

## 2022-12-06 DIAGNOSIS — H04123 Dry eye syndrome of bilateral lacrimal glands: Secondary | ICD-10-CM | POA: Diagnosis not present

## 2022-12-06 DIAGNOSIS — H30033 Focal chorioretinal inflammation, peripheral, bilateral: Secondary | ICD-10-CM | POA: Diagnosis not present

## 2022-12-06 DIAGNOSIS — Z961 Presence of intraocular lens: Secondary | ICD-10-CM | POA: Diagnosis not present

## 2022-12-06 DIAGNOSIS — Z9889 Other specified postprocedural states: Secondary | ICD-10-CM | POA: Diagnosis not present

## 2022-12-06 DIAGNOSIS — Z947 Corneal transplant status: Secondary | ICD-10-CM | POA: Diagnosis not present

## 2022-12-06 DIAGNOSIS — M810 Age-related osteoporosis without current pathological fracture: Secondary | ICD-10-CM

## 2022-12-06 DIAGNOSIS — H401112 Primary open-angle glaucoma, right eye, moderate stage: Secondary | ICD-10-CM | POA: Diagnosis not present

## 2022-12-06 DIAGNOSIS — H18601 Keratoconus, unspecified, right eye: Secondary | ICD-10-CM | POA: Diagnosis not present

## 2022-12-06 DIAGNOSIS — H401123 Primary open-angle glaucoma, left eye, severe stage: Secondary | ICD-10-CM | POA: Diagnosis not present

## 2022-12-06 NOTE — Telephone Encounter (Signed)
Last Fill: 08/24/2022  Labs: 08/12/2022 CBC and CMP are normal.   Next Visit: 01/11/2023  Last Visit: 08/12/2022  DX: Age-related osteoporosis without current pathological fracture   Current Dose per office note 08/12/2022: Forteo 11/2021.  She has been tolerating Forteo without any side effects.   Okay to refill Forteo?

## 2022-12-13 ENCOUNTER — Other Ambulatory Visit: Payer: Self-pay | Admitting: Cardiology

## 2022-12-13 DIAGNOSIS — I10 Essential (primary) hypertension: Secondary | ICD-10-CM

## 2022-12-14 ENCOUNTER — Encounter: Payer: Self-pay | Admitting: Internal Medicine

## 2022-12-14 NOTE — Assessment & Plan Note (Signed)
Followed in Pulmonary clinic/ [facility]/ Liem Copenhaver    Assoc with RA      - PFT's 06/15/2006 FEV1 (1.53)  Ratio 84 and DLC0 46 corrects to 90%     - PFT's  03/04/2011  FEV1 (1.73)  Ratio 83 and VC 2.11 and DLC0 63% corrects  94%     - PFT's 11/13/2013    VC 1.94 with DLCO 65 corrects to 102%     - 08/27/2013  Walked RA x 2 laps @ 185 ft each stopped due to  Oximeter stopped recording accurately but sats still 96% and no sob - PFT's  06/14/2017  FVC 1.71  (60 % ) ratio 89  p 5 % improvement from saba p nothing prior to study with DLCO  59 % corrects to 97 % for alv volume   PFT's  06/21/2018  FVC  1.85 (65%)   p no % improvement from saba p nothing prior to study with DLCO  65 % corrects to 106  % for alv volume   PFT's  06/22/2019  FVC 1.68 (58 % )   with DLCO  12.93 (70%) corrects to 4.59 (108 %)  for alv volume and FV curve nl   CHEST  HRCT  03/03/20  1. Pulmonary parenchymal pattern of fibrosis appears grossly stable from 07/21/2010. Findings are consistent with UIP per consensus guidelines: Diagnosis of Idiopathic Pulmonary Fibrosis: An Official ATS/ERS/JRS/ALAT Clinical Practice Guideline. Am Rosezetta Schlatter Crit Care Med Vol 198, Iss 5, [phone], Mar 19 2017. 2. Aortic atherosclerosis (ICD10-I70.0). Coronary artery calcification. 3. Enlarged pulmonic trunk, indicative of pulmonary arterial hypertension.      - Echo   05/12/20  LVH nod / GII diastolic dysfunction /  PAS around 40 with nl RV -  06/23/2020   Walked RA  3laps @ approx 241ft each @ brisk pace  stopped due to end of study, no desats, no sob or CP with sats at end = 97% - 05/20/2022   Walked on RA  x  3  lap(s) =  approx 750  ft  @ fast pace, stopped due to end of study s sob  with lowest 02 sats 94%  - .PFT's  05/20/2022    FVC 1.68 (60% )   with DLCO  11.53 (63%) corrects to  93%  for alv volume and FV curve nl      No evidence of clinical or radiographic progression of ILD related to RA   Advised: Make sure you check your oxygen  saturation at your highest level of activity(NOT after you stop)  to be sure it stays over 90% and keep track of it at least once a week, more often if breathing getting worse, and let me know if losing ground. (Collect the dots to connect the dots approach)    F/u 12 m, sooner prn         Each maintenance medication was reviewed in detail including emphasizing most importantly the difference between maintenance and prns and under what circumstances the prns are to be triggered using an action plan format where appropriate.  Total time for H and P, chart review, counseling,   and generating customized AVS unique to this office visit / same day charting = 25 min

## 2022-12-21 DIAGNOSIS — H938X9 Other specified disorders of ear, unspecified ear: Secondary | ICD-10-CM | POA: Diagnosis not present

## 2022-12-22 ENCOUNTER — Ambulatory Visit: Payer: Medicare PPO | Admitting: Neurology

## 2022-12-22 ENCOUNTER — Encounter: Payer: Self-pay | Admitting: Neurology

## 2022-12-22 DIAGNOSIS — G43709 Chronic migraine without aura, not intractable, without status migrainosus: Secondary | ICD-10-CM

## 2022-12-22 MED ORDER — ONABOTULINUMTOXINA 200 UNITS IJ SOLR
155.0000 [IU] | Freq: Once | INTRAMUSCULAR | Status: AC
Start: 2022-12-22 — End: 2022-12-22
  Administered 2022-12-22: 155 [IU] via INTRAMUSCULAR

## 2022-12-22 NOTE — Progress Notes (Signed)
Consent Form Botulism Toxin Injection For Chronic Migraine  12/22/2022: Ask Brianna [name] to see, she wants to come off of dilantin. As far as migraines she is stable.   09/29/2022: Migraines improved > 50% freq and severity with botox. Try to get vyepti approved. She gets > 50% improvement with botox but still has >8 migraine days a month and > 15 total headache days a month.   03/16/2022: stable; >70% improvement in frequency of migraines, Baseline 15 severemigraine days a month and daily headaches. No corrugators or procerus. Very high frontalis. Gave nurtec samples.   12/01/2021: stable  09/08/2021: stable  Interval history 06/09/2021: stable. No corrugators or procerus. Very high frontalis.  Broke her patella in a wheelchair now.>70% improvement in frequency of migraines, Baseline 15 severemigraine days a month and daily headaches.  Interval history 03/10/2021: stable. No corrugators or procerus. Very high frontalis. Rest in traps. Broke her patella in a wheelchair now.  Interval history 12/01/2020: stable  Send to Dr. Vickey Huger, intractable headches, morning headaches: Intractable headaches, wakes with headaches, has headaches when she wakes in the morning, hx of strokes: sleep evaluation  Imitrex injections: Patient has intractable chronic migraines, the best thing that is ever worked for her is Imitrex injections, we have tried multiple alternative medications including analgesics, Nurtec, Ubrelvy, Reyvow, and multiple other for second and third line acute management medications.  Patient had stroke, at that time we took her off her Imitrex, today she implore only request to have it back, she understands the risks, we did review the risks and discussed that sometimes life is about weighing the benefits versus those risks, at this time I will prescribe them for her, she is fully aware of the risk that she is taking with a history of vascular disease.  Interval history 02/12/2020: Migraines  fluctuate with her eye problems, she sees cornea specialists and retina specialists and they may install long term steroid release long term in the eyes. Las botox she had only 4 migraine days a month taken care of by imitrex, significant.  >70% improvement in frequency of migraines, Baseline 15 severemigraine days a month and daily headaches. No clenching.  She absolutely loved  .Dry needling: cervical myofascial pain, Send back to dry needling - [address] for cervical myofascial pain syndrome. Gave samples for Nurtec for her to try as well acutely at last visit but she did not like it.  Tried: VPA, Topamax, antidepressants, norvasc, phenergan, frova, aimovig, ajovy, fioricet, (save 6 As), verapamil    Reviewed orally with patient, additionally signature is on file:  Botulism toxin has been approved by the Federal drug administration for treatment of chronic migraine. Botulism toxin does not cure chronic migraine and it may not be effective in some patients.  The administration of botulism toxin is accomplished by injecting a small amount of toxin into the muscles of the neck and head. Dosage must be titrated for each individual. Any benefits resulting from botulism toxin tend to wear off after 3 months with a repeat injection required if benefit is to be maintained. Injections are usually done every 3-4 months with maximum effect peak achieved by about 2 or 3 weeks. Botulism toxin is expensive and you should be sure of what costs you will incur resulting from the injection.  The side effects of botulism toxin use for chronic migraine may include:   -Transient, and usually mild, facial weakness with facial injections  -Transient, and usually mild, head or neck weakness with head/neck  injections  -Reduction or loss of forehead facial animation due to forehead muscle weakness  -Eyelid drooping  -Dry eye  -Pain at the site of injection or bruising at the site of injection  -Double  vision  -Potential unknown long term risks  Contraindications: You should not have Botox if you are pregnant, nursing, allergic to albumin, have an infection, skin condition, or muscle weakness at the site of the injection, or have myasthenia gravis, Lambert-Eaton syndrome, or ALS.  It is also possible that as with any injection, there may be an allergic reaction or no effect from the medication. Reduced effectiveness after repeated injections is sometimes seen and rarely infection at the injection site may occur. All care will be taken to prevent these side effects. If therapy is given over a long time, atrophy and wasting in the muscle injected may occur. Occasionally the patient's become refractory to treatment because they develop antibodies to the toxin. In this event, therapy needs to be modified.  I have read the above information and consent to the administration of botulism toxin.    BOTOX PROCEDURE NOTE FOR MIGRAINE HEADACHE    Contraindications and precautions discussed with patient(above). Aseptic procedure was observed and patient tolerated procedure. Procedure performed by Dr. Artemio Aly  The condition has existed for more than 6 months, and pt does not have a diagnosis of ALS, Myasthenia Gravis or Lambert-Eaton Syndrome.  Risks and benefits of injections discussed and pt agrees to proceed with the procedure.  Written consent obtained  These injections are medically necessary. Pt  receives good benefits from these injections. These injections do not cause sedations or hallucinations which the oral therapies may cause.  Description of procedure:  The patient was placed in a sitting position. The standard protocol was used for Botox as follows, with 5 units of Botox injected at each site:   -Procerus muscle, midline injection DID NOT PERFORM  -Corrugator muscle, bilateral injection DID NOT PERFORM  -Frontalis muscle, bilateral injection, with 2 sites each side, medial  injection was performed in the upper one third of the frontalis muscle, in the region vertical from the medial inferior edge of the superior orbital rim. The lateral injection was again in the upper one third of the forehead vertically above the lateral limbus of the cornea, 1.5 cm lateral to the medial injection site.  -Temporalis muscle injection, 4 sites, bilaterally. The first injection was 3 cm above the tragus of the ear, second injection site was 1.5 cm to 3 cm up from the first injection site in line with the tragus of the ear. The third injection site was 1.5-3 cm forward between the first 2 injection sites. The fourth injection site was 1.5 cm posterior to the second injection site.   -Occipitalis muscle injection, 3 sites, bilaterally. The first injection was done one half way between the occipital protuberance and the tip of the mastoid process behind the ear. The second injection site was done lateral and superior to the first, 1 fingerbreadth from the first injection. The third injection site was 1 fingerbreadth superiorly and medially from the first injection site.  -Cervical paraspinal muscle injection, 2 sites, bilateral knee first injection site was 1 cm from the midline of the cervical spine, 3 cm inferior to the lower border of the occipital protuberance. The second injection site was 1.5 cm superiorly and laterally to the first injection site.  -Trapezius muscle injection was performed at 3 sites, bilaterally. The first injection site was in the upper  trapezius muscle halfway between the inflection point of the neck, and the acromion. The second injection site was one half way between the acromion and the first injection site. The third injection was done between the first injection site and the inflection point of the neck.   Will return for repeat injection in 3 months.   140 units of Botox was used, 60U Botox not injected was wasted. The patient tolerated the procedure well,  there were no complications of the above procedure.

## 2022-12-22 NOTE — Progress Notes (Signed)
Botox- 200 units x 1 vial Lot: [id number] Expiration: 01/2025 NDC: 5409-8119-14  Bacteriostatic 0.9% Sodium Chloride- 4 mL  Lot: [id number] Expiration: 10/18/2023 NDC: 2130-8657-84  Dx: O96.295 B/B Witnessed by Alveria Apley. CMA

## 2022-12-28 NOTE — Progress Notes (Unsigned)
Office Visit Note  [name]             Date of Birth: [birth year]           MRN: [id number]             PCP: Irena Reichmann, DO Referring: Irena Reichmann, DO Visit Date: 01/11/2023 Occupation: @GUAROCC @  Subjective:  C-spine pain   History of Present Illness: Brianna [name] is a 72 y.o. female with history of seropositive rheumatoid arthritis, ILD, and osteoporosis.  Patient remains on  Arava 20 mg 1 tablet by mouth daily and Humira 40 mg sq every 14 days prescribed by Dr. Sherryll Burger.  She is tolerating nation therapy without any side effects and has not missed any doses recently.  She denies any recent iritis flares.  She denies any increased joint pain or joint swelling secondary to rheumatoid arthritis.  Patient states that she has been having increased pain and stiffness in her neck.  She remains under the care of Dr. Yetta Barre.  She has had a CT of the cervical spine and is not currently a candidate for surgery.  She has been trying to follow the restrictions placed by Dr. Yetta Barre. Patient denies any new or worsening pulmonary symptoms.  She remains under the care of Dr. Sherene Sires.  She had an updated chest x-ray on 11/18/22.   She remains on Forteo daily injections for management of osteoporosis. She denies any recent or recurrent infections.    Activities of Daily Living:  Patient reports morning stiffness for all day.   Patient Reports nocturnal pain.  Difficulty dressing/grooming: Denies Difficulty climbing stairs: Denies Difficulty getting out of chair: Denies Difficulty using hands for taps, buttons, cutlery, and/or writing: Reports  Review of Systems  Constitutional:  Positive for fatigue.  HENT:  Positive for mouth dryness. Negative for mouth sores.   Eyes:  Positive for redness and dryness. Negative for pain and itching.  Respiratory:  Positive for shortness of breath.   Cardiovascular:  Negative for chest pain and palpitations.  Gastrointestinal:  Positive for  constipation. Negative for blood in stool and diarrhea.  Endocrine: Negative for increased urination.  Genitourinary:  Positive for nocturia. Negative for involuntary urination.  Musculoskeletal:  Positive for joint pain, joint pain, myalgias, muscle weakness, morning stiffness, muscle tenderness and myalgias. Negative for gait problem and joint swelling.  Skin:  Positive for color change. Negative for rash, hair loss and sensitivity to sunlight.  Allergic/Immunologic: Negative for susceptible to infections.  Neurological:  Positive for dizziness, numbness and headaches.  Hematological:  Negative for swollen glands.  Psychiatric/Behavioral:  Positive for sleep disturbance. Negative for depressed mood. The patient is not nervous/anxious.     PMFS History:  Patient Active Problem List   Diagnosis Date Noted   Gastroesophageal reflux disease without esophagitis 03/24/2022   Altered taste 03/24/2022   Primary osteoarthritis of first carpometacarpal joint of left hand 12/18/2021   Coronary artery disease involving native coronary artery of native heart without angina pectoris 11/23/2021   Rhinitis, chronic 09/23/2021   Closed fracture of left patella 06/22/2021   Low back pain 05/28/2021   Displacement of lumbar intervertebral disc without myelopathy 05/28/2021   Episodic cluster headache, not intractable 03/24/2021   Morning headache 03/24/2021   Cerebrovascular accident (CVA) (HCC) 03/24/2021   Chronic migraine without aura, intractable, without status migrainosus 03/24/2021   Patellar fracture 02/11/2021   Dyslipidemia 02/11/2021   Pain of left thumb 02/02/2021   Anxiety 08/28/2020  Carotid artery disease (HCC) 08/28/2020   Late effects of cerebrovascular disease 08/28/2020   Macular degeneration 08/28/2020   Other long term (current) drug therapy 08/28/2020   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   Pure hypercholesterolemia 08/28/2020   Vitamin D deficiency 08/28/2020    Exertional chest pain 06/24/2020   Hypertension 05/12/2020   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   Acute urinary tract infection 11/13/2019   Epilepsy (HCC) 11/13/2019   Corneal ulcer of left eye 12/05/2018   Acute left eye pain 12/05/2018   Endophthalmitis, acute, left 12/05/2018   Ulcer of left cornea 12/05/2018   URI, acute 05/19/2018   Cerumen impaction 05/19/2018   Primary open angle glaucoma (POAG) of left eye, severe stage 02/16/2018   Laceration of left middle finger without foreign body without damage to nail 11/23/2017   Trigger thumb, right thumb 10/05/2017   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   Numbness 04/25/2017   Pain 04/25/2017   Primary open angle glaucoma (POAG) of right eye, moderate stage 04/15/2017   Bilateral impacted cerumen 01/31/2017   History of macular degeneration 09/13/2016   History of migraine 09/06/2016   History of seizures 09/06/2016   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   Secondary corneal edema of right eye 06/28/2016   Lumbar radiculopathy 05/24/2016   Degeneration of lumbosacral intervertebral disc 05/24/2016   Iritis of right eye 05/12/2016   Rheumatoid arthritis with positive rheumatoid factor (HCC) 05/11/2016   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   High risk medication use 05/11/2016   Osteoporosis 05/11/2016   ILD (interstitial lung disease) (HCC) 05/11/2016   Intractable chronic migraine without aura and without status migrainosus 04/02/2016   Intractable chronic migraine without aura 04/02/2016   Peripheral focal chorioretinal inflammation of both eyes 01/04/2016   Squamous blepharitis of upper and lower eyelids of both eyes 10/22/2015   Dry eyes, bilateral 10/21/2015   History of high risk medication treatment 04/28/2015   Cystoid macular edema of left eye 09/23/2014   History of carotid endarterectomy 06/27/2014   Acquired myogenic ptosis of both eyelids 06/07/2014   Myogenic ptosis, acquired 06/07/2014    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   Migraine without aura 04/19/2013   Seizures (HCC) 04/19/2013   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   Keratoconus, 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   Minor corneal opacity 08/02/2011   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   Cough 01/14/2011   Pulmonary fibrosis (HCC) 01/14/2011    Past Medical History:  Diagnosis Date   Arthritis    in the neck   Carotid artery occlusion    Epilepsy (HCC)    GERD (gastroesophageal reflux disease)    Glaucoma    Hyperlipidemia    Hypertension    Keratoconus of both eyes 1981   Macular degeneration    Migraines    Peripheral vascular disease (HCC)    carotid occlusion surgery on left   Raynaud's disease    Retinal edema    Rheumatoid arthritis(714.0)    Stroke (HCC)    two mild strokes presumed from left carotid stenosis    Family History  Problem Relation Age of Onset   Heart disease Mother    Lung disease Mother        ? disease process   Uterine cancer Mother    Heart disease Father    Clotting disorder Father    Collagen disease Father    Cervical cancer Maternal Aunt    Prostate cancer Maternal Grandfather    Rheum arthritis Sister    High Cholesterol Sister  Epilepsy Sister    Rheum arthritis Maternal Grandmother    High Cholesterol Brother    High blood pressure Brother    High blood pressure Brother    High Cholesterol Brother    High blood pressure Brother    High Cholesterol Brother    Past Surgical History:  Procedure Laterality Date   CAROTID ARTERY ANGIOPLASTY  2008   Dr Madilyn Fireman   CAROTID ENDARTERECTOMY Left 11-15-2007   cataract extraction Right 04-2014   [facility]   CHOLECYSTECTOMY  1995   CORNEAL TRANSPLANT     x 5 ; steroid inj. 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   [facility]   CORNEAL TRANSPLANT Left 04/17/2019   EYE SURGERY Right 12/2016   cornea repair    EYE SURGERY Right 09/20/2017   cornea transplant    GLAUCOMA SURGERY Right 2018   ORIF PATELLA Left  02/12/2021   Procedure: OPEN REDUCTION INTERNAL (ORIF) FIXATION PATELLA;  Surgeon: Yolonda Kida, MD;  Location: [facility] OR;  Service: Orthopedics;  Laterality: Left;   TRIGGER FINGER RELEASE Right 05/16/2018   Procedure: RELEASE TRIGGER FINGER/A-1 PULLEY RIGHT THUMB;  Surgeon: Cindee Salt, MD;  Location: [facility];  Service: Orthopedics;  Laterality: Right;   Social History   Social History Narrative   Patient is married to [name]), has 2 children   Patient is right handed   Education level is Bachelor's degree   Caffeine consumption is about 2 cups daily   Lives at home with husband    Immunization History  Administered Date(s) Administered   Fluad Quad(high Dose 65+) 05/14/2022   Influenza Split 04/26/2013, 04/08/2017   Influenza, High Dose Seasonal PF 04/09/2018   Influenza, Quadrivalent, Recombinant, Inj, Pf 04/21/2017, 03/19/2019   Influenza-Unspecified 04/19/2016, 10/29/2020   PFIZER(Purple Top)SARS-COV-2 Vaccination 08/24/2019, 09/18/2019, 05/12/2020, 10/29/2020   Pneumococcal Conjugate-13 08/27/2013, 07/05/2017   Pneumococcal Polysaccharide-23 01/14/2011   Tdap 11/15/2017   Unspecified SARS-COV-2 Vaccination 10/29/2020   Zoster Recombinat (Shingrix) 07/19/2019, 11/28/2019     Objective: Vital Signs: BP 121/78 (BP Location: Left Arm, Patient Position: Sitting, Cuff Size: Normal)   Pulse 90   Resp 14   Ht 5\' 2"  (1.575 m)   Wt 115 lb (52.2 kg)   BMI 21.03 kg/m    Physical Exam Vitals and nursing note reviewed.  Constitutional:      Appearance: She is well-developed.  HENT:     Head: Normocephalic and atraumatic.  Eyes:     Conjunctiva/sclera: Conjunctivae normal.  Cardiovascular:     Rate and Rhythm: Normal rate and regular rhythm.     Heart sounds: Normal heart sounds.  Pulmonary:     Effort: Pulmonary effort is normal.     Comments: +Crackles at lung bases  Abdominal:     General: Bowel sounds are normal.     Palpations: Abdomen is  soft.  Musculoskeletal:     Cervical back: Normal range of motion.  Skin:    General: Skin is warm and dry.     Capillary Refill: Capillary refill takes less than 2 seconds.  Neurological:     Mental Status: She is alert and oriented to person, place, and time.  Psychiatric:        Behavior: Behavior normal.      Musculoskeletal Exam: C-spine has limited range of motion with lateral rotation.  Limited range of motion above shoulder joints.  Elbow joints have good range of motion with no tenderness along the joint line.  Wrist joints  have good range of motion with no tenderness or synovitis.  Thickening of bilateral CMC joints.  Thickening over the left first MCP with subluxation and tenderness.  Thickening of all MCP, PIP, DIP joints.  No active synovitis noted.  Hip joints have good range of motion with no groin pain.  Knee joints have good range of motion with no warmth or effusion.  Ankle joints have good range of motion with no joint tenderness or synovitis.  CDAI Exam: CDAI Score: 0  Patient Global: 0 / 100; Provider Global: 0 / 100 Swollen: 0 ; Tender: 1  Joint Exam 01/11/2023      Right  Left  Cervical Spine   Tender        Investigation: No additional findings.  Imaging: No results found.  Recent Labs: Lab Results  Component Value Date   WBC 7.8 08/12/2022   HGB 11.7 08/12/2022   PLT 288 08/12/2022   NA 139 08/12/2022   K 4.5 08/12/2022   CL 100 08/12/2022   CO2 31 08/12/2022   GLUCOSE 88 08/12/2022   BUN 16 08/12/2022   CREATININE 0.81 08/12/2022   BILITOT 0.3 08/12/2022   ALKPHOS 104 03/18/2021   AST 30 08/12/2022   ALT 20 08/12/2022   PROT 7.3 08/12/2022   ALBUMIN 4.3 03/18/2021   CALCIUM 9.2 08/12/2022   GFRAA 107 05/26/2020   QFTBGOLDPLUS NEGATIVE 08/12/2022    Speciality Comments: Last Reclast 11/30/2017   Procedures:  No procedures performed Allergies: Morphine, Morphine and codeine, Alphagan [brimonidine], Codeine, Dorzolamide hcl-timolol  mal, Hydrocodone-acetaminophen, Sulfa antibiotics, and Sulfamethoxazole       Assessment / Plan:     Visit Diagnoses: Rheumatoid arthritis involving multiple sites with positive rheumatoid factor (HCC): She has no synovitis on examination today.  She experiences intermittent discomfort in the left thumb specifically in the left first MCP joint. Thickening and subluxation of the left 1st MCP joint noted.  She has been experiencing locking of the left thumb intermittently consistent with a trigger thumb.  Patient plans on following up with her hand surgeon for further evaluation and management.  Overall her rheumatoid arthritis remains stable on Humira 40 mg subcutaneous injections every 14 days and Arava 20 mg 1 tablet by mouth daily.  No missed doses.  No recent or recurrent infections.  No medication changes will be made at this time.  She is vies notify us if she develops signs or symptoms of a flare.  She will follow-up in the office in 5 months or sooner if needed.  High risk medication use - Arava 20 mg 1 tablet by mouth daily and Humira 40 mg sq every 14 days prescribed by Dr. Sherryll Burger.  CBC and CMP WNL on 08/12/22. Orders for CBC and CMP released today.  Her next lab work will be due in September and every 3 months to monitor for toxicity. TB gold negative on 08/12/22.  No recent or recurrent infections. Discussed the importance of holding arava and humira if she develops signs or symptoms of an infection and to resume once the infection has completely cleared.  Encouraged patient to follow-up with dermatology on a yearly basis for skin cancer screening.  - Plan: CBC with Differential/Platelet, COMPLETE METABOLIC PANEL WITH GFR  ILD (interstitial lung disease) (HCC) - She is followed by Dr. Sherene Sires.  Chest x-ray 09/22/2021 stable fibrotic changes.  No acute infiltrate. Pulmonary function testing performed on 05/20/2022. Reviewed Dr. Thurston Hole office visit note from 11/18/22.  Chronic cough-unchanged per  patient.  Crackles noted at lung bases today.  CXR updated on 11/18/22: chronic ILD.  No recent high resolution chest CT to review.   Iritis of right eye - She is followed by Dr.Shah, Dr. Dan Humphreys, and Dr. Lottie Dawson. She remains on Humira 40 mg subcutaneous injections every 14 days, which is prescribed by Dr. Sherryll Burger.  No signs or symptoms of an iritis flare.  Trochanteric bursitis, right hip: Not currently symptomatic.   DDD (degenerative disc disease), cervical: Under care of Dr. Yetta Barre.  Chronic pain.  CT of cervical spine 10/21/2022 diffuse severe cervical spine degeneration with abnormal vertebral alignment and multilevel spinal stenosis, stable from MRI in March 2024.  C1 and C2 appear intact and aligned.  Congenital incomplete ossification of the posterior C1 ring. She does not plan to proceed with surgical intervention.  She has been trying to follow activity restrictions discussed with Dr. Yetta Barre.   DDD (degenerative disc disease), lumbar: Under care of Dr. Yetta Barre.   Age-related osteoporosis without current pathological fracture - DEXA on 02/09/2022: AP spine normal, left femoral neck T score -3.2, right femoral neck T score -2.8, left third forearm T score -3.4.  Patient was started on Forteo 11/2021.  She continues to tolerate forteo daily injections. She is also taking a calcium and vitamin D supplement daily.   Due to update DEXA in July 2025.   Other medical conditions are listed as follows:   History of seizures  History of carotid endarterectomy  History of migraine  History of glaucoma  History of macular degeneration  Orders: Orders Placed This Encounter  Procedures   CBC with Differential/Platelet   COMPLETE METABOLIC PANEL WITH GFR   No orders of the defined types were placed in this encounter.     Follow-Up Instructions: Return in about 5 months (around 06/13/2023) for Rheumatoid arthritis, ILD, Osteoporosis.   Gearldine Bienenstock, PA-C  Note - This record has been created  using Dragon software.  Chart creation errors have been sought, but may not always  have been located. Such creation errors do not reflect on  the standard of medical care.

## 2023-01-11 ENCOUNTER — Ambulatory Visit: Payer: Medicare PPO | Attending: Physician Assistant | Admitting: Physician Assistant

## 2023-01-11 ENCOUNTER — Encounter: Payer: Self-pay | Admitting: Physician Assistant

## 2023-01-11 VITALS — BP 121/78 | HR 90 | Resp 14 | Ht 62.0 in | Wt 115.0 lb

## 2023-01-11 DIAGNOSIS — M0579 Rheumatoid arthritis with rheumatoid factor of multiple sites without organ or systems involvement: Secondary | ICD-10-CM | POA: Diagnosis not present

## 2023-01-11 DIAGNOSIS — Z8669 Personal history of other diseases of the nervous system and sense organs: Secondary | ICD-10-CM

## 2023-01-11 DIAGNOSIS — M7061 Trochanteric bursitis, right hip: Secondary | ICD-10-CM | POA: Diagnosis not present

## 2023-01-11 DIAGNOSIS — Z87898 Personal history of other specified conditions: Secondary | ICD-10-CM | POA: Diagnosis not present

## 2023-01-11 DIAGNOSIS — J849 Interstitial pulmonary disease, unspecified: Secondary | ICD-10-CM | POA: Diagnosis not present

## 2023-01-11 DIAGNOSIS — M5136 Other intervertebral disc degeneration, lumbar region: Secondary | ICD-10-CM

## 2023-01-11 DIAGNOSIS — H209 Unspecified iridocyclitis: Secondary | ICD-10-CM | POA: Diagnosis not present

## 2023-01-11 DIAGNOSIS — M810 Age-related osteoporosis without current pathological fracture: Secondary | ICD-10-CM

## 2023-01-11 DIAGNOSIS — M5030 Other cervical disc degeneration, unspecified cervical region: Secondary | ICD-10-CM

## 2023-01-11 DIAGNOSIS — Z9889 Other specified postprocedural states: Secondary | ICD-10-CM | POA: Diagnosis not present

## 2023-01-11 DIAGNOSIS — Z79899 Other long term (current) drug therapy: Secondary | ICD-10-CM | POA: Diagnosis not present

## 2023-01-11 LAB — CBC WITH DIFFERENTIAL/PLATELET
Basophils Relative: 0.8 %
Eosinophils Absolute: 361 cells/uL (ref 15–500)
Eosinophils Relative: 4.2 %
MPV: 11.3 fL (ref 7.5–12.5)
Neutro Abs: 5452 cells/uL (ref 1500–7800)
Platelets: 298 10*3/uL (ref 140–400)
WBC: 8.6 10*3/uL (ref 3.8–10.8)

## 2023-01-11 NOTE — Progress Notes (Signed)
Absolute monocytes are borderline elevated. Rest of CBC WNL.  We will continue to monitor.

## 2023-01-11 NOTE — Patient Instructions (Signed)
Standing Labs We placed an order today for your standing lab work.   Please have your standing labs drawn in September and every 3 months   Please have your labs drawn 2 weeks prior to your appointment so that the provider can discuss your lab results at your appointment, if possible.  Please note that you may see your imaging and lab results in MyChart before we have reviewed them. We will contact you once all results are reviewed. Please allow our office up to 72 hours to thoroughly review all of the results before contacting the office for clarification of your results.  WALK-IN LAB HOURS  Monday through Thursday from 8:00 am -12:30 pm and 1:00 pm-5:00 pm and Friday from 8:00 am-12:00 pm.  Patients with office visits requiring labs will be seen before walk-in labs.  You may encounter longer than normal wait times. Please allow additional time. Wait times may be shorter on  Monday and Thursday afternoons.  We do not book appointments for walk-in labs. We appreciate your patience and understanding with our staff.   Labs are drawn by [facility]. Please bring your co-pay at the time of your lab draw.  You may receive a bill from [facility] for your lab work.  Please note if you are on Hydroxychloroquine and and an order has been placed for a Hydroxychloroquine level,  you will need to have it drawn 4 hours or more after your last dose.  If you wish to have your labs drawn at another location, please call the office 24 hours in advance so we can fax the orders.  The office is located at [address], [address], [place], [place] [address]   If you have any questions regarding directions or hours of operation,  please call [phone].   As a reminder, please drink plenty of water prior to coming for your lab work. Thanks!  

## 2023-01-12 LAB — CBC WITH DIFFERENTIAL/PLATELET
Absolute Monocytes: 1023 cells/uL — ABNORMAL HIGH (ref 200–950)
Basophils Absolute: 69 cells/uL (ref 0–200)
HCT: 36.1 % (ref 35.0–45.0)
Hemoglobin: 11.7 g/dL (ref 11.7–15.5)
Lymphs Abs: 1694 cells/uL (ref 850–3900)
MCH: 27.5 pg (ref 27.0–33.0)
MCHC: 32.4 g/dL (ref 32.0–36.0)
MCV: 84.7 fL (ref 80.0–100.0)
Monocytes Relative: 11.9 %
Neutrophils Relative %: 63.4 %
RBC: 4.26 10*6/uL (ref 3.80–5.10)
RDW: 14.8 % (ref 11.0–15.0)
Total Lymphocyte: 19.7 %

## 2023-01-12 LAB — COMPLETE METABOLIC PANEL WITH GFR
AG Ratio: 1.2 (calc) (ref 1.0–2.5)
ALT: 21 U/L (ref 6–29)
AST: 28 U/L (ref 10–35)
Albumin: 4.2 g/dL (ref 3.6–5.1)
Alkaline phosphatase (APISO): 116 U/L (ref 37–153)
BUN: 20 mg/dL (ref 7–25)
CO2: 30 mmol/L (ref 20–32)
Calcium: 9.6 mg/dL (ref 8.6–10.4)
Chloride: 98 mmol/L (ref 98–110)
Creat: 0.75 mg/dL (ref 0.60–1.00)
Globulin: 3.4 g/dL (calc) (ref 1.9–3.7)
Glucose, Bld: 99 mg/dL (ref 65–99)
Potassium: 3.8 mmol/L (ref 3.5–5.3)
Sodium: 139 mmol/L (ref 135–146)
Total Bilirubin: 0.4 mg/dL (ref 0.2–1.2)
Total Protein: 7.6 g/dL (ref 6.1–8.1)
eGFR: 86 mL/min/{1.73_m2} (ref >=60)

## 2023-01-12 NOTE — Progress Notes (Signed)
CMP WNL.   Please forward results to Dr. Sherryll Burger as requested.

## 2023-01-13 DIAGNOSIS — L821 Other seborrheic keratosis: Secondary | ICD-10-CM | POA: Diagnosis not present

## 2023-01-13 DIAGNOSIS — D225 Melanocytic nevi of trunk: Secondary | ICD-10-CM | POA: Diagnosis not present

## 2023-01-13 DIAGNOSIS — L853 Xerosis cutis: Secondary | ICD-10-CM | POA: Diagnosis not present

## 2023-01-13 DIAGNOSIS — L820 Inflamed seborrheic keratosis: Secondary | ICD-10-CM | POA: Diagnosis not present

## 2023-01-18 ENCOUNTER — Ambulatory Visit: Payer: Medicare PPO | Attending: Cardiology | Admitting: Cardiology

## 2023-01-18 ENCOUNTER — Encounter: Payer: Self-pay | Admitting: Cardiology

## 2023-01-18 VITALS — BP 134/78 | HR 79 | Ht 62.0 in | Wt 115.0 lb

## 2023-01-18 DIAGNOSIS — I10 Essential (primary) hypertension: Secondary | ICD-10-CM | POA: Diagnosis not present

## 2023-01-18 DIAGNOSIS — I6523 Occlusion and stenosis of bilateral carotid arteries: Secondary | ICD-10-CM | POA: Diagnosis not present

## 2023-01-18 DIAGNOSIS — I2510 Atherosclerotic heart disease of native coronary artery without angina pectoris: Secondary | ICD-10-CM | POA: Diagnosis not present

## 2023-01-18 NOTE — Progress Notes (Signed)
  Cardiology Office Note:  .   Date:  01/18/2023  ID:  Brianna [name], DOB [birth year], MRN [id number] PCP: Irena Reichmann, DO  [facility] Providers Cardiologist:  Donato Schultz, MD    History of Present Illness: .   Brianna [name] is a 72 y.o. female here for follow-up of hypertension.  Pulmonary trunk on CT scan 31 mm.  Coronary calcifications noted.  On Repatha Crestor Zetia.  Most recent LDL 49.  Excellent.  Has three-vessel coronary artery disease 2 minor strokes left carotid  Overall she feels very well.  No chest pain fevers chills nausea vomiting syncope bleeding.  Sometimes she will feel some mild shortness of breath in the winter with her lungs.   Studies Reviewed: Marland Kitchen   EKG Interpretation Date/Time:  Tuesday January 18 2023 15:49:36 EDT Ventricular Rate:  79 PR Interval:  172 QRS Duration:  100 QT Interval:  396 QTC Calculation: 454 R Axis:   -14  Text Interpretation: Normal sinus rhythm Incomplete right bundle branch block Poor R wave progression When compared with ECG of 11-Feb-2021 13:05, No significant change since last tracing Confirmed by Donato Schultz ([id number]) on 01/18/2023 4:14:39 PM    Echocardiogram 2021-EF 70% grade 2 diastolic dysfunction pulmonary pressures 40 mmHg moderate mitral valve regurgitation holosystolic prolapse multiple scallops posterior leaflet.  Risk Assessment/Calculations:            Physical Exam:   VS:  BP 134/78   Pulse 79   Ht 5\' 2"  (1.575 m)   Wt 115 lb (52.2 kg)   BMI 21.03 kg/m    Wt Readings from Last 3 Encounters:  01/18/23 115 lb (52.2 kg)  01/11/23 115 lb (52.2 kg)  11/18/22 114 lb 9.6 oz (52 kg)    GEN: Well nourished, well developed in no acute distress NECK: No JVD; No carotid bruits CARDIAC: RRR, soft systolic murmur,no rubs, gallops RESPIRATORY:  Clear to auscultation without rales, wheezing or rhonchi  ABDOMEN: Soft, non-tender, non-distended EXTREMITIES:  No edema; No deformity   ASSESSMENT AND PLAN: .     Coronary artery disease - Multivessel coronary artery calcification noted on prior CT.  Personally reviewed her scan.  No symptoms.  Continue with aggressive risk factor modification.  Goal-directed medical therapy.  Excellent.  On low-dose aspirin, cholesterol medications as below, antihypertensives.  Carotid artery disease - Left-sided carotid endarterectomy.  Prior right-sided strokes.  Continue aggressive risk factor prevention.  Hyper cholesterolemia - On Repatha Zetia and Crestor.  LDL 49.  No myalgias.  Plaque stabilization.  Excellent.      Dispo: 1 year  Signed, Donato Schultz, MD

## 2023-01-18 NOTE — Patient Instructions (Signed)
Medication Instructions:  The current medical regimen is effective;  continue present plan and medications.  *If you need a refill on your cardiac medications before your next appointment, please call your pharmacy*  Follow-Up: At [facility], you and your health needs are our priority.  As part of our continuing mission to provide you with exceptional heart care, we have created designated Provider Care Teams.  These Care Teams include your primary Cardiologist (physician) and Advanced Practice Providers (APPs -  Physician Assistants and Nurse Practitioners) who all work together to provide you with the care you need, when you need it.  We recommend signing up for the patient portal called "MyChart".  Sign up information is provided on this After Visit Summary.  MyChart is used to connect with patients for Virtual Visits (Telemedicine).  Patients are able to view lab/test results, encounter notes, upcoming appointments, etc.  Non-urgent messages can be sent to your provider as well.   To learn more about what you can do with MyChart, go to [email or web address].    Your next appointment:   1 year(s)  Provider:   Mark Skains, MD      

## 2023-01-26 DIAGNOSIS — M65312 Trigger thumb, left thumb: Secondary | ICD-10-CM | POA: Diagnosis not present

## 2023-01-26 DIAGNOSIS — M1812 Unilateral primary osteoarthritis of first carpometacarpal joint, left hand: Secondary | ICD-10-CM | POA: Diagnosis not present

## 2023-02-04 DIAGNOSIS — Z947 Corneal transplant status: Secondary | ICD-10-CM | POA: Diagnosis not present

## 2023-02-04 DIAGNOSIS — H30033 Focal chorioretinal inflammation, peripheral, bilateral: Secondary | ICD-10-CM | POA: Diagnosis not present

## 2023-02-04 DIAGNOSIS — H401123 Primary open-angle glaucoma, left eye, severe stage: Secondary | ICD-10-CM | POA: Diagnosis not present

## 2023-02-04 DIAGNOSIS — Z961 Presence of intraocular lens: Secondary | ICD-10-CM | POA: Diagnosis not present

## 2023-02-04 DIAGNOSIS — Z9889 Other specified postprocedural states: Secondary | ICD-10-CM | POA: Diagnosis not present

## 2023-02-04 DIAGNOSIS — H401112 Primary open-angle glaucoma, right eye, moderate stage: Secondary | ICD-10-CM | POA: Diagnosis not present

## 2023-02-04 DIAGNOSIS — H18601 Keratoconus, unspecified, right eye: Secondary | ICD-10-CM | POA: Diagnosis not present

## 2023-02-10 DIAGNOSIS — H401112 Primary open-angle glaucoma, right eye, moderate stage: Secondary | ICD-10-CM | POA: Diagnosis not present

## 2023-02-10 DIAGNOSIS — H401123 Primary open-angle glaucoma, left eye, severe stage: Secondary | ICD-10-CM | POA: Diagnosis not present

## 2023-02-17 DIAGNOSIS — Z1231 Encounter for screening mammogram for malignant neoplasm of breast: Secondary | ICD-10-CM | POA: Diagnosis not present

## 2023-02-17 DIAGNOSIS — Z6821 Body mass index (BMI) 21.0-21.9, adult: Secondary | ICD-10-CM | POA: Diagnosis not present

## 2023-02-17 DIAGNOSIS — Z01419 Encounter for gynecological examination (general) (routine) without abnormal findings: Secondary | ICD-10-CM | POA: Diagnosis not present

## 2023-02-21 ENCOUNTER — Other Ambulatory Visit: Payer: Self-pay | Admitting: Cardiology

## 2023-03-07 DIAGNOSIS — M65331 Trigger finger, right middle finger: Secondary | ICD-10-CM | POA: Diagnosis not present

## 2023-03-07 DIAGNOSIS — M65341 Trigger finger, right ring finger: Secondary | ICD-10-CM | POA: Diagnosis not present

## 2023-03-10 DIAGNOSIS — H401112 Primary open-angle glaucoma, right eye, moderate stage: Secondary | ICD-10-CM | POA: Diagnosis not present

## 2023-03-10 DIAGNOSIS — H401123 Primary open-angle glaucoma, left eye, severe stage: Secondary | ICD-10-CM | POA: Diagnosis not present

## 2023-03-12 ENCOUNTER — Other Ambulatory Visit: Payer: Self-pay | Admitting: Cardiology

## 2023-03-12 DIAGNOSIS — I10 Essential (primary) hypertension: Secondary | ICD-10-CM

## 2023-03-14 DIAGNOSIS — H18601 Keratoconus, unspecified, right eye: Secondary | ICD-10-CM | POA: Diagnosis not present

## 2023-03-14 DIAGNOSIS — H401112 Primary open-angle glaucoma, right eye, moderate stage: Secondary | ICD-10-CM | POA: Diagnosis not present

## 2023-03-14 DIAGNOSIS — H401123 Primary open-angle glaucoma, left eye, severe stage: Secondary | ICD-10-CM | POA: Diagnosis not present

## 2023-03-14 DIAGNOSIS — H04123 Dry eye syndrome of bilateral lacrimal glands: Secondary | ICD-10-CM | POA: Diagnosis not present

## 2023-03-14 DIAGNOSIS — H30033 Focal chorioretinal inflammation, peripheral, bilateral: Secondary | ICD-10-CM | POA: Diagnosis not present

## 2023-03-14 DIAGNOSIS — Z947 Corneal transplant status: Secondary | ICD-10-CM | POA: Diagnosis not present

## 2023-03-14 DIAGNOSIS — Z961 Presence of intraocular lens: Secondary | ICD-10-CM | POA: Diagnosis not present

## 2023-03-15 DIAGNOSIS — M65312 Trigger thumb, left thumb: Secondary | ICD-10-CM | POA: Diagnosis not present

## 2023-03-15 DIAGNOSIS — M65331 Trigger finger, right middle finger: Secondary | ICD-10-CM | POA: Diagnosis not present

## 2023-03-15 DIAGNOSIS — M65341 Trigger finger, right ring finger: Secondary | ICD-10-CM | POA: Diagnosis not present

## 2023-03-22 ENCOUNTER — Ambulatory Visit: Payer: Medicare PPO | Admitting: Neurology

## 2023-03-22 DIAGNOSIS — H401112 Primary open-angle glaucoma, right eye, moderate stage: Secondary | ICD-10-CM | POA: Diagnosis not present

## 2023-03-22 DIAGNOSIS — G43709 Chronic migraine without aura, not intractable, without status migrainosus: Secondary | ICD-10-CM

## 2023-03-22 DIAGNOSIS — H401123 Primary open-angle glaucoma, left eye, severe stage: Secondary | ICD-10-CM | POA: Diagnosis not present

## 2023-03-22 DIAGNOSIS — H30033 Focal chorioretinal inflammation, peripheral, bilateral: Secondary | ICD-10-CM | POA: Diagnosis not present

## 2023-03-22 DIAGNOSIS — H3581 Retinal edema: Secondary | ICD-10-CM | POA: Diagnosis not present

## 2023-03-22 DIAGNOSIS — Z79899 Other long term (current) drug therapy: Secondary | ICD-10-CM | POA: Diagnosis not present

## 2023-03-22 DIAGNOSIS — Z947 Corneal transplant status: Secondary | ICD-10-CM | POA: Diagnosis not present

## 2023-03-22 DIAGNOSIS — H30102 Unspecified disseminated chorioretinal inflammation, left eye: Secondary | ICD-10-CM | POA: Diagnosis not present

## 2023-03-22 DIAGNOSIS — H353221 Exudative age-related macular degeneration, left eye, with active choroidal neovascularization: Secondary | ICD-10-CM | POA: Diagnosis not present

## 2023-03-22 MED ORDER — ONABOTULINUMTOXINA 200 UNITS IJ SOLR
155.0000 [IU] | Freq: Once | INTRAMUSCULAR | Status: AC
Start: 2023-03-22 — End: 2023-03-22
  Administered 2023-03-22: 155 [IU] via INTRAMUSCULAR

## 2023-03-22 NOTE — Progress Notes (Signed)
Consent Form Botulism Toxin Injection For Chronic Migraine   03/22/2023 still doing well on botox; Migraines improved > 50% freq and severity with botox.  12/22/2022: Ask [facility] to see, she wants to come off of dilantin. As far as migraines she is stable.   09/29/2022: Migraines improved > 50% freq and severity with botox. Try to get vyepti approved. She gets > 50% improvement with botox but still has >8 migraine days a month and > 15 total headache days a month.   03/16/2022: stable; >70% improvement in frequency of migraines, Baseline 15 severemigraine days a month and daily headaches. No corrugators or procerus. Very high frontalis. Gave nurtec samples.   12/01/2021: stable  09/08/2021: stable  Interval history 06/09/2021: stable. No corrugators or procerus. Very high frontalis.  Broke her patella in a wheelchair now.>70% improvement in frequency of migraines, Baseline 15 severemigraine days a month and daily headaches.  Interval history 03/10/2021: stable. No corrugators or procerus. Very high frontalis. Rest in traps. Broke her patella in a wheelchair now.  Interval history 12/01/2020: stable  Send to Dr. Vickey Huger, intractable headches, morning headaches: Intractable headaches, wakes with headaches, has headaches when she wakes in the morning, hx of strokes: sleep evaluation  Imitrex injections: Patient has intractable chronic migraines, the best thing that is ever worked for her is Imitrex injections, we have tried multiple alternative medications including analgesics, Nurtec, Ubrelvy, Reyvow, and multiple other for second and third line acute management medications.  Patient had stroke, at that time we took her off her Imitrex, today she implore only request to have it back, she understands the risks, we did review the risks and discussed that sometimes life is about weighing the benefits versus those risks, at this time I will prescribe them for her, she is fully aware of the risk that  she is taking with a history of vascular disease.  Interval history 02/12/2020: Migraines fluctuate with her eye problems, she sees cornea specialists and retina specialists and they may install long term steroid release long term in the eyes. Las botox she had only 4 migraine days a month taken care of by imitrex, significant.  >70% improvement in frequency of migraines, Baseline 15 severemigraine days a month and daily headaches. No clenching.  She absolutely loved  .Dry needling: cervical myofascial pain, Send back to dry needling - [address] for cervical myofascial pain syndrome. Gave samples for Nurtec for her to try as well acutely at last visit but she did not like it.  Tried: VPA, Topamax, antidepressants, norvasc, phenergan, frova, aimovig, ajovy, fioricet, (save 6 As), verapamil    Reviewed orally with patient, additionally signature is on file:  Botulism toxin has been approved by the Federal drug administration for treatment of chronic migraine. Botulism toxin does not cure chronic migraine and it may not be effective in some patients.  The administration of botulism toxin is accomplished by injecting a small amount of toxin into the muscles of the neck and head. Dosage must be titrated for each individual. Any benefits resulting from botulism toxin tend to wear off after 3 months with a repeat injection required if benefit is to be maintained. Injections are usually done every 3-4 months with maximum effect peak achieved by about 2 or 3 weeks. Botulism toxin is expensive and you should be sure of what costs you will incur resulting from the injection.  The side effects of botulism toxin use for chronic migraine may include:   -Transient, and usually  mild, facial weakness with facial injections  -Transient, and usually mild, head or neck weakness with head/neck injections  -Reduction or loss of forehead facial animation due to forehead muscle weakness  -Eyelid drooping  -Dry  eye  -Pain at the site of injection or bruising at the site of injection  -Double vision  -Potential unknown long term risks  Contraindications: You should not have Botox if you are pregnant, nursing, allergic to albumin, have an infection, skin condition, or muscle weakness at the site of the injection, or have myasthenia gravis, Lambert-Eaton syndrome, or ALS.  It is also possible that as with any injection, there may be an allergic reaction or no effect from the medication. Reduced effectiveness after repeated injections is sometimes seen and rarely infection at the injection site may occur. All care will be taken to prevent these side effects. If therapy is given over a long time, atrophy and wasting in the muscle injected may occur. Occasionally the patient's become refractory to treatment because they develop antibodies to the toxin. In this event, therapy needs to be modified.  I have read the above information and consent to the administration of botulism toxin.    BOTOX PROCEDURE NOTE FOR MIGRAINE HEADACHE    Contraindications and precautions discussed with patient(above). Aseptic procedure was observed and patient tolerated procedure. Procedure performed by Dr. Artemio Aly  The condition has existed for more than 6 months, and pt does not have a diagnosis of ALS, Myasthenia Gravis or Lambert-Eaton Syndrome.  Risks and benefits of injections discussed and pt agrees to proceed with the procedure.  Written consent obtained  These injections are medically necessary. Pt  receives good benefits from these injections. These injections do not cause sedations or hallucinations which the oral therapies may cause.  Description of procedure:  The patient was placed in a sitting position. The standard protocol was used for Botox as follows, with 5 units of Botox injected at each site:   -Procerus muscle, midline injection DID NOT PERFORM  -Corrugator muscle, bilateral injection DID NOT  PERFORM  -Frontalis muscle, bilateral injection, with 2 sites each side, medial injection was performed in the upper one third of the frontalis muscle, in the region vertical from the medial inferior edge of the superior orbital rim. The lateral injection was again in the upper one third of the forehead vertically above the lateral limbus of the cornea, 1.5 cm lateral to the medial injection site.  -Temporalis muscle injection, 4 sites, bilaterally. The first injection was 3 cm above the tragus of the ear, second injection site was 1.5 cm to 3 cm up from the first injection site in line with the tragus of the ear. The third injection site was 1.5-3 cm forward between the first 2 injection sites. The fourth injection site was 1.5 cm posterior to the second injection site.   -Occipitalis muscle injection, 3 sites, bilaterally. The first injection was done one half way between the occipital protuberance and the tip of the mastoid process behind the ear. The second injection site was done lateral and superior to the first, 1 fingerbreadth from the first injection. The third injection site was 1 fingerbreadth superiorly and medially from the first injection site.  -Cervical paraspinal muscle injection, 2 sites, bilateral knee first injection site was 1 cm from the midline of the cervical spine, 3 cm inferior to the lower border of the occipital protuberance. The second injection site was 1.5 cm superiorly and laterally to the first injection site.  -  Trapezius muscle injection was performed at 3 sites, bilaterally. The first injection site was in the upper trapezius muscle halfway between the inflection point of the neck, and the acromion. The second injection site was one half way between the acromion and the first injection site. The third injection was done between the first injection site and the inflection point of the neck.   Will return for repeat injection in 3 months.   140 units of Botox was  used, 60U Botox not injected was wasted. The patient tolerated the procedure well, there were no complications of the above procedure.

## 2023-03-22 NOTE — Progress Notes (Signed)
Botox- 200 units x 1 vial Lot: [id number] Expiration: 06/2025 NDC: 5409-8119-14  Bacteriostatic 0.9% Sodium Chloride- 4 mL  Lot: [id number] Expiration: 10/18/2023 NDC: 2130-8657-84  Dx: O96.295 B/B Drawn up by Dr Lucia Gaskins, witnessed by Leeann Must RN

## 2023-04-06 ENCOUNTER — Encounter: Payer: Self-pay | Admitting: Neurology

## 2023-04-08 ENCOUNTER — Other Ambulatory Visit: Payer: Self-pay | Admitting: Cardiology

## 2023-04-08 DIAGNOSIS — I10 Essential (primary) hypertension: Secondary | ICD-10-CM

## 2023-04-21 DIAGNOSIS — H30033 Focal chorioretinal inflammation, peripheral, bilateral: Secondary | ICD-10-CM | POA: Diagnosis not present

## 2023-04-21 DIAGNOSIS — H3581 Retinal edema: Secondary | ICD-10-CM | POA: Diagnosis not present

## 2023-04-21 DIAGNOSIS — H401123 Primary open-angle glaucoma, left eye, severe stage: Secondary | ICD-10-CM | POA: Diagnosis not present

## 2023-04-21 DIAGNOSIS — Z9889 Other specified postprocedural states: Secondary | ICD-10-CM | POA: Diagnosis not present

## 2023-04-21 DIAGNOSIS — H353221 Exudative age-related macular degeneration, left eye, with active choroidal neovascularization: Secondary | ICD-10-CM | POA: Diagnosis not present

## 2023-04-21 DIAGNOSIS — H30102 Unspecified disseminated chorioretinal inflammation, left eye: Secondary | ICD-10-CM | POA: Diagnosis not present

## 2023-04-21 DIAGNOSIS — H401112 Primary open-angle glaucoma, right eye, moderate stage: Secondary | ICD-10-CM | POA: Diagnosis not present

## 2023-04-21 DIAGNOSIS — Z947 Corneal transplant status: Secondary | ICD-10-CM | POA: Diagnosis not present

## 2023-04-23 ENCOUNTER — Other Ambulatory Visit: Payer: Self-pay | Admitting: Cardiology

## 2023-04-23 DIAGNOSIS — I10 Essential (primary) hypertension: Secondary | ICD-10-CM

## 2023-04-25 ENCOUNTER — Other Ambulatory Visit: Payer: Self-pay | Admitting: Cardiology

## 2023-04-25 DIAGNOSIS — E785 Hyperlipidemia, unspecified: Secondary | ICD-10-CM

## 2023-04-25 DIAGNOSIS — I6522 Occlusion and stenosis of left carotid artery: Secondary | ICD-10-CM

## 2023-05-06 ENCOUNTER — Other Ambulatory Visit: Payer: Self-pay | Admitting: Cardiology

## 2023-05-06 DIAGNOSIS — I10 Essential (primary) hypertension: Secondary | ICD-10-CM

## 2023-05-10 DIAGNOSIS — H3581 Retinal edema: Secondary | ICD-10-CM | POA: Diagnosis not present

## 2023-05-10 DIAGNOSIS — H30033 Focal chorioretinal inflammation, peripheral, bilateral: Secondary | ICD-10-CM | POA: Diagnosis not present

## 2023-05-12 DIAGNOSIS — H401112 Primary open-angle glaucoma, right eye, moderate stage: Secondary | ICD-10-CM | POA: Diagnosis not present

## 2023-05-12 DIAGNOSIS — H401123 Primary open-angle glaucoma, left eye, severe stage: Secondary | ICD-10-CM | POA: Diagnosis not present

## 2023-05-16 DIAGNOSIS — H30033 Focal chorioretinal inflammation, peripheral, bilateral: Secondary | ICD-10-CM | POA: Diagnosis not present

## 2023-05-16 DIAGNOSIS — Z79899 Other long term (current) drug therapy: Secondary | ICD-10-CM | POA: Diagnosis not present

## 2023-05-24 NOTE — Progress Notes (Signed)
Office Visit Note  [name]             Date of Birth: 1951/03/12           MRN: [id number]             PCP: Irena Reichmann, DO Referring: Irena Reichmann, DO Visit Date: 06/07/2023 Occupation: @GUAROCC @  Subjective:  Medication monitoring   History of Present Illness: Brianna [name] is a 72 y.o. female with history of seropositive rheumatoid arthritis, ILD, and iritis.  Patient remains on Arava 20 mg 1 tablet by mouth daily and Humira 40 mg sq every 14 days prescribed by Dr. Sherryll Burger.  She is tolerating combination therapy without any side effects and has not had any gaps in therapy.  She has ongoing soreness in both thumbs and stiffness and pain in the neck.  She denies any increased joint swelling at this time.  She has issues with balance at times which she attributes to issues with her neck.  She denies any recent falls.  Patient states she completed forteo daily injections in July 2024.  She has remained on a vitamin D supplement but cannot tolerate taking a calcium supplement.  She may be having upcoming dental work at the end of January 2024.   She denies any new pulmonary symptoms.  She states with cooler weather she has increased breathlessness and a cough.   She is up to date with the annual flu shot and covid vaccine.   She continues to see dermatology on a yearly basis.      Activities of Daily Living:  Patient reports morning stiffness for 10  minutes.   Patient Denies nocturnal pain.  Difficulty dressing/grooming: Denies Difficulty climbing stairs: Denies Difficulty getting out of chair: Denies Difficulty using hands for taps, buttons, cutlery, and/or writing: Reports  Review of Systems  Constitutional:  Positive for fatigue.  HENT:  Positive for mouth dryness. Negative for mouth sores and nose dryness.   Eyes:  Positive for redness and dryness. Negative for pain and visual disturbance.  Respiratory:  Positive for cough. Negative for hemoptysis, shortness of  breath and difficulty breathing.   Cardiovascular:  Negative for chest pain, palpitations, hypertension and swelling in legs/feet.  Gastrointestinal:  Negative for blood in stool, constipation and diarrhea.  Endocrine: Negative for increased urination.  Genitourinary:  Negative for painful urination.  Musculoskeletal:  Positive for joint pain, joint pain, myalgias, morning stiffness, muscle tenderness and myalgias. Negative for joint swelling and muscle weakness.  Skin:  Positive for color change. Negative for pallor, rash, hair loss, nodules/bumps, skin tightness, ulcers and sensitivity to sunlight.  Allergic/Immunologic: Negative for susceptible to infections.  Neurological:  Positive for headaches. Negative for dizziness, numbness and weakness.  Hematological:  Negative for swollen glands.  Psychiatric/Behavioral:  Negative for depressed mood and sleep disturbance. The patient is not nervous/anxious.     PMFS History:  Patient Active Problem List   Diagnosis Date Noted   Gastroesophageal reflux disease without esophagitis 03/24/2022   Altered taste 03/24/2022   Primary osteoarthritis of first carpometacarpal joint of left hand 12/18/2021   Coronary artery disease involving native coronary artery of native heart without angina pectoris 11/23/2021   Rhinitis, chronic 09/23/2021   Closed fracture of left patella 06/22/2021   Low back pain 05/28/2021   Displacement of lumbar intervertebral disc without myelopathy 05/28/2021   Episodic cluster headache, not intractable 03/24/2021   Morning headache 03/24/2021   Cerebrovascular accident (CVA) (HCC) 03/24/2021  Chronic migraine without aura, intractable, without status migrainosus 03/24/2021   Patellar fracture 02/11/2021   Dyslipidemia 02/11/2021   Pain of left thumb 02/02/2021   Anxiety 08/28/2020   Carotid artery disease (HCC) 08/28/2020   Late effects of cerebrovascular disease 08/28/2020   Macular degeneration 08/28/2020   Other  long term (current) drug therapy 08/28/2020   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   Pure hypercholesterolemia 08/28/2020   Vitamin D deficiency 08/28/2020   Exertional chest pain 06/24/2020   Hypertension 05/12/2020   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   Acute urinary tract infection 11/13/2019   Epilepsy (HCC) 11/13/2019   Corneal ulcer of left eye 12/05/2018   Acute left eye pain 12/05/2018   Endophthalmitis, acute, left 12/05/2018   Ulcer of left cornea 12/05/2018   URI, acute 05/19/2018   Cerumen impaction 05/19/2018   Primary open angle glaucoma (POAG) of left eye, severe stage 02/16/2018   Laceration of left middle finger without foreign body without damage to nail 11/23/2017   Trigger thumb, right thumb 10/05/2017   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   Numbness 04/25/2017   Pain 04/25/2017   Primary open angle glaucoma (POAG) of right eye, moderate stage 04/15/2017   Bilateral impacted cerumen 01/31/2017   History of macular degeneration 09/13/2016   History of migraine 09/06/2016   History of seizures 09/06/2016   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   Secondary corneal edema of right eye 06/28/2016   Lumbar radiculopathy 05/24/2016   Degeneration of lumbosacral intervertebral disc 05/24/2016   Iritis of right eye 05/12/2016   Rheumatoid arthritis with positive rheumatoid factor (HCC) 05/11/2016   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   High risk medication use 05/11/2016   Osteoporosis 05/11/2016   ILD (interstitial lung disease) (HCC) 05/11/2016   Intractable chronic migraine without aura and without status migrainosus 04/02/2016   Intractable chronic migraine without aura 04/02/2016   Peripheral focal chorioretinal inflammation of both eyes 01/04/2016   Squamous blepharitis of upper and lower eyelids of both eyes 10/22/2015   Dry eyes, bilateral 10/21/2015   History of high risk medication treatment 04/28/2015   Cystoid macular  edema of left eye 09/23/2014   History of carotid endarterectomy 06/27/2014   Acquired myogenic ptosis of both eyelids 06/07/2014   Myogenic ptosis, acquired 06/07/2014   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   Migraine without aura 04/19/2013   Seizures (HCC) 04/19/2013   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   Keratoconus, 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   Minor corneal opacity 08/02/2011   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   Cough 01/14/2011   Pulmonary fibrosis (HCC) 01/14/2011    Past Medical History:  Diagnosis Date   Arthritis    in the neck   Carotid artery occlusion    Epilepsy (HCC)    GERD (gastroesophageal reflux disease)    Glaucoma    Hyperlipidemia    Hypertension    Keratoconus of both eyes 1981   Macular degeneration    Migraines    Peripheral vascular disease (HCC)    carotid occlusion surgery on left   Raynaud's disease    Retinal edema    Rheumatoid arthritis(714.0)    Stroke (HCC)    two mild strokes presumed from left carotid stenosis    Family History  Problem Relation Age of Onset   Heart disease Mother    Lung disease Mother        ? disease process   Uterine cancer Mother    Heart disease Father    Clotting disorder Father  Collagen disease Father    Cervical cancer Maternal Aunt    Prostate cancer Maternal Grandfather    Rheum arthritis Sister    High Cholesterol Sister    Epilepsy Sister    Rheum arthritis Maternal Grandmother    High Cholesterol Brother    High blood pressure Brother    High blood pressure Brother    High Cholesterol Brother    High blood pressure Brother    High Cholesterol Brother    Past Surgical History:  Procedure Laterality Date   CAROTID ARTERY ANGIOPLASTY  2008   Dr Madilyn Fireman   CAROTID ENDARTERECTOMY Left 11-15-2007   cataract extraction Right 04-2014   [facility]   CHOLECYSTECTOMY  1995   CORNEAL TRANSPLANT     x 5 ; steroid inj. 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   [facility]   CORNEAL TRANSPLANT  Left 04/17/2019   EYE SURGERY Right 12/2016   cornea repair    EYE SURGERY Right 09/20/2017   cornea transplant    GLAUCOMA SURGERY Right 2018   ORIF PATELLA Left 02/12/2021   Procedure: OPEN REDUCTION INTERNAL (ORIF) FIXATION PATELLA;  Surgeon: Yolonda Kida, MD;  Location: [facility] OR;  Service: Orthopedics;  Laterality: Left;   TRIGGER FINGER RELEASE Right 05/16/2018   Procedure: RELEASE TRIGGER FINGER/A-1 PULLEY RIGHT THUMB;  Surgeon: Cindee Salt, MD;  Location: [facility];  Service: Orthopedics;  Laterality: Right;   Social History   Social History Narrative   Patient is married to [name]), has 2 children   Patient is right handed   Education level is Bachelor's degree   Caffeine consumption is about 2 cups daily   Lives at home with husband    Immunization History  Administered Date(s) Administered   Fluad Quad(high Dose 65+) 05/14/2022   Influenza Split 04/26/2013, 04/08/2017   Influenza, High Dose Seasonal PF 04/09/2018   Influenza, Quadrivalent, Recombinant, Inj, Pf 04/21/2017, 03/19/2019   Influenza-Unspecified 04/19/2016, 10/29/2020   PFIZER(Purple Top)SARS-COV-2 Vaccination 08/24/2019, 09/18/2019, 05/12/2020, 10/29/2020   Pneumococcal Conjugate-13 08/27/2013, 07/05/2017   Pneumococcal Polysaccharide-23 01/14/2011   Tdap 11/15/2017   Unspecified SARS-COV-2 Vaccination 10/29/2020   Zoster Recombinant(Shingrix) 07/19/2019, 11/28/2019     Objective: Vital Signs: BP (!) 146/78 (BP Location: Left Arm, Patient Position: Sitting, Cuff Size: Normal)   Pulse 80   Resp 15   Ht 5' (1.524 m)   Wt 115 lb 12.8 oz (52.5 kg)   BMI 22.62 kg/m    Physical Exam Vitals and nursing note reviewed.  Constitutional:      Appearance: She is well-developed.  HENT:     Head: Normocephalic and atraumatic.  Eyes:     Conjunctiva/sclera: Conjunctivae normal.  Cardiovascular:     Rate and Rhythm: Normal rate and regular rhythm.     Heart sounds: Normal heart sounds.   Pulmonary:     Effort: Pulmonary effort is normal.     Breath sounds: Normal breath sounds.  Abdominal:     General: Bowel sounds are normal.     Palpations: Abdomen is soft.  Musculoskeletal:     Cervical back: Normal range of motion.  Lymphadenopathy:     Cervical: No cervical adenopathy.  Skin:    General: Skin is warm and dry.     Capillary Refill: Capillary refill takes less than 2 seconds.  Neurological:     Mental Status: She is alert and oriented to person, place, and time.  Psychiatric:        Behavior: Behavior  normal.      Musculoskeletal Exam: C-spine limited ROM with lateral rotation.  Limited ROM of both shoulders.  Elbow joints, wrist joints, MCPs, PIPs, and DIPs good ROM with no synovitis.  Thickening and tenderness of bilateral 1st MCP joints.  Thickening of both CMC joints.  Thickening of all MCP, PIP, and DIP joints.  Hip joints have good ROM with no groin pain.  Knee joints have good ROM with no warmth or effusion.  Ankle joints have good ROM with no tenderness or joint swelling   CDAI Exam: CDAI Score: -- Patient Global: --; Provider Global: -- Swollen: --; Tender: -- Joint Exam 06/07/2023   No joint exam has been documented for this visit   There is currently no information documented on the homunculus. Go to the Rheumatology activity and complete the homunculus joint exam.  Investigation: No additional findings.  Imaging: No results found.  Recent Labs: Lab Results  Component Value Date   WBC 8.6 01/11/2023   HGB 11.7 01/11/2023   PLT 298 01/11/2023   NA 139 01/11/2023   K 3.8 01/11/2023   CL 98 01/11/2023   CO2 30 01/11/2023   GLUCOSE 99 01/11/2023   BUN 20 01/11/2023   CREATININE 0.75 01/11/2023   BILITOT 0.4 01/11/2023   ALKPHOS 104 03/18/2021   AST 28 01/11/2023   ALT 21 01/11/2023   PROT 7.6 01/11/2023   ALBUMIN 4.3 03/18/2021   CALCIUM 9.6 01/11/2023   GFRAA 107 05/26/2020   QFTBGOLDPLUS NEGATIVE 08/12/2022    Speciality  Comments: Last Reclast 11/30/2017   Procedures:  No procedures performed Allergies: Morphine, Morphine and codeine, Alphagan [brimonidine], Codeine, Dorzolamide hcl-timolol mal, Hydrocodone-acetaminophen, Sulfa antibiotics, and Sulfamethoxazole    Assessment / Plan:     Visit Diagnoses: Rheumatoid arthritis involving multiple sites with positive rheumatoid factor (HCC) - She has no synovitis on examination today.  She has not had any signs or symptoms of a rheumatoid arthritis flare.  She has clinically been doing well taking Arava 20 mg 1 tablet by mouth daily and Humira 40 mg subcu days injections every 14 days.  She is tolerating combination therapy without any side effects and has not had any gaps in therapy.  She has CMC, MCP, PIP, and DIP thickening consistent with rheumatoid arthritis and osteoarthritis overlap.  Significant thickening and tenderness noted over the first MCP joint bilaterally.  No obvious synovitis noted.  No medication changes will be made at this time.  She was advised to notify us if she develops signs or symptoms of a flare.  She will follow-up in the office in 5 months or sooner if needed.  Plan: CBC with Differential/Platelet  ILD (interstitial lung disease) (HCC) - She is followed by Dr. Sherene Sires.  Previous chest x-ray 09/22/2021 stable fibrotic changes.  No acute infiltrate. Pulmonary function testing performed on 05/20/2022. Reviewed Dr. Thurston Hole office visit note from 11/18/22.  Chronic cough--exacerbated by cooler weather temperatures per patient.  Crackles noted at lung bases today.  CXR updated on 11/18/22: chronic ILD.  No recent high resolution chest CT to review.  Plan: CBC with Differential/Platelet  High risk medication use - Arava 20 mg 1 tablet by mouth daily and Humira 40 mg sq every 14 days prescribed by Dr. Sherryll Burger. CBC and CMP updated on 05/16/23. Her next lab work will be due in January and every 3 months.  Future orders for CBC and CMP remain in place.   TB gold  negative on 05/16/23.  No recent or recurrent  infections. Discussed the importance of holding arava and humira if she develops signs or symptoms of an infection and to resume once the infection has completley cleared.   Up to date with yearly skin cancer screening and annual flu shot and covid vaccine.    - Plan: VITAMIN D 25 Hydroxy (Vit-D Deficiency, Fractures), Comprehensive Metabolic Panel (CMET), CBC with Differential/Platelet  Iritis of right eye - She is followed by Dr.Shah, Dr. Dan Humphreys, and Dr. Lottie Dawson. She remains on Humira 40 mg subcutaneous injections every 14 days and is taking arava 20 mg daily.  She remains under close follow up.    Trochanteric bursitis, right hip: Not currently symptomatic   DDD (degenerative disc disease), cervical - 10/21/2022 diffuse severe degeneration w/ abnormal vertebral alignmentmultilevel spinal stenosis, stable from MRI in March 2024.Congenital incomplete C1 ring.  Chronic pain.  Issues with balance at times. No recent falls.   Under care of Dr. Yetta Barre. Not a surgical candidate.    Degeneration of intervertebral disc of lumbar region without discogenic back pain or lower extremity pain - Under care of Dr. Yetta Barre  Age-related osteoporosis without current pathological fracture - DEXA on 02/09/2022: AP spine normal, LFNT score -3.2, right femoral neck T score -2.8, left third forearm T score -3.4.  Patient completed Forteo daily injections in July 2024. Previous therapy: Reclast--last infusion was May 2019 No recent falls or fractures She is taking a vitamin D supplement daily.  Different treatment options were discussed today since she has completed the 2-year course of Forteo treatment.  Plan to reinitiate IV Reclast and will be rechecking her DEXA in July 2025. The patient may be having upcoming dental work at the end of January 2025 but will be checking with her dentist and will notify us with any updates.  Reviewed indications, contraindications, potential side  effects of IV Reclast today in detail.  All questions were addressed.  Discussed that she will require updated lab work 1 month prior to scheduling the infusion.  Future order for vitamin D, CBC, and CMP were placed today. - Plan: VITAMIN D 25 Hydroxy (Vit-D Deficiency, Fractures), Comprehensive Metabolic Panel (CMET), CBC with Differential/Platelet  Other medical conditions are listed as follows:   History of seizures  History of carotid endarterectomy  History of migraine  History of glaucoma  History of macular degeneration  Orders: Orders Placed This Encounter  Procedures   VITAMIN D 25 Hydroxy (Vit-D Deficiency, Fractures)   Comprehensive Metabolic Panel (CMET)   CBC with Differential/Platelet   No orders of the defined types were placed in this encounter.   .  Follow-Up Instructions: Return in about 4 months (around 10/05/2023).   Gearldine Bienenstock, PA-C  Note - This record has been created using Dragon software.  Chart creation errors have been sought, but may not always  have been located. Such creation errors do not reflect on  the standard of medical care.

## 2023-06-01 DIAGNOSIS — H903 Sensorineural hearing loss, bilateral: Secondary | ICD-10-CM | POA: Diagnosis not present

## 2023-06-01 DIAGNOSIS — H6123 Impacted cerumen, bilateral: Secondary | ICD-10-CM | POA: Diagnosis not present

## 2023-06-04 ENCOUNTER — Other Ambulatory Visit: Payer: Self-pay | Admitting: Cardiology

## 2023-06-04 DIAGNOSIS — I6522 Occlusion and stenosis of left carotid artery: Secondary | ICD-10-CM

## 2023-06-04 DIAGNOSIS — E785 Hyperlipidemia, unspecified: Secondary | ICD-10-CM

## 2023-06-06 DIAGNOSIS — H401112 Primary open-angle glaucoma, right eye, moderate stage: Secondary | ICD-10-CM | POA: Diagnosis not present

## 2023-06-06 DIAGNOSIS — H401123 Primary open-angle glaucoma, left eye, severe stage: Secondary | ICD-10-CM | POA: Diagnosis not present

## 2023-06-07 ENCOUNTER — Ambulatory Visit: Payer: Medicare PPO | Attending: Physician Assistant | Admitting: Physician Assistant

## 2023-06-07 ENCOUNTER — Telehealth: Payer: Self-pay | Admitting: Pharmacist

## 2023-06-07 ENCOUNTER — Encounter: Payer: Self-pay | Admitting: Physician Assistant

## 2023-06-07 VITALS — BP 146/78 | HR 80 | Resp 15 | Ht 60.0 in | Wt 115.8 lb

## 2023-06-07 DIAGNOSIS — M0579 Rheumatoid arthritis with rheumatoid factor of multiple sites without organ or systems involvement: Secondary | ICD-10-CM

## 2023-06-07 DIAGNOSIS — Z8669 Personal history of other diseases of the nervous system and sense organs: Secondary | ICD-10-CM

## 2023-06-07 DIAGNOSIS — M51369 Other intervertebral disc degeneration, lumbar region without mention of lumbar back pain or lower extremity pain: Secondary | ICD-10-CM

## 2023-06-07 DIAGNOSIS — M5030 Other cervical disc degeneration, unspecified cervical region: Secondary | ICD-10-CM

## 2023-06-07 DIAGNOSIS — Z79899 Other long term (current) drug therapy: Secondary | ICD-10-CM | POA: Diagnosis not present

## 2023-06-07 DIAGNOSIS — H209 Unspecified iridocyclitis: Secondary | ICD-10-CM

## 2023-06-07 DIAGNOSIS — Z9889 Other specified postprocedural states: Secondary | ICD-10-CM

## 2023-06-07 DIAGNOSIS — J849 Interstitial pulmonary disease, unspecified: Secondary | ICD-10-CM

## 2023-06-07 DIAGNOSIS — Z87898 Personal history of other specified conditions: Secondary | ICD-10-CM

## 2023-06-07 DIAGNOSIS — M810 Age-related osteoporosis without current pathological fracture: Secondary | ICD-10-CM

## 2023-06-07 DIAGNOSIS — M7061 Trochanteric bursitis, right hip: Secondary | ICD-10-CM | POA: Diagnosis not present

## 2023-06-07 NOTE — Progress Notes (Signed)
Pharmacy Note  Subjective: Patient presents today to the [facility] for follow up office visit.   Patient seen by pharmacist for counseling on Reclast therapy for osteoporosis. Prior osteoporosis treatment includes: Forteo (has completed two years of treatment after starting in treatment in May 2023).  Was previously on Reclast  Objective: T-score:  DEXA on 02/09/2022: AP spine normal, left femoral neck T score -3.2, right femoral neck T score -2.8, left third forearm T score -3.4.  Lab Results  Component Value Date   VD25OH 42 02/19/2020   CMP     Component Value Date/Time   NA 139 01/11/2023 1027   NA 140 03/18/2021 0914   K 3.8 01/11/2023 1027   CL 98 01/11/2023 1027   CO2 30 01/11/2023 1027   GLUCOSE 99 01/11/2023 1027   BUN 20 01/11/2023 1027   BUN 11 03/18/2021 0914   CREATININE 0.75 01/11/2023 1027   CALCIUM 9.6 01/11/2023 1027   PROT 7.6 01/11/2023 1027   PROT 7.0 03/18/2021 0914   ALBUMIN 4.3 03/18/2021 0914   AST 28 01/11/2023 1027   ALT 21 01/11/2023 1027   ALKPHOS 104 03/18/2021 0914   BILITOT 0.4 01/11/2023 1027   BILITOT 0.2 03/18/2021 0914   GFRNONAA >60 02/15/2021 0448   GFRNONAA 95 12/13/2018 1352   GFRAA 107 05/26/2020 1607   GFRAA 110 12/13/2018 1352      Assessment and Plan:  Counseled patient that Reclast is an IV bisphosphonate that reduces bone turnover by inhibiting osteoclasts that chew up bone.  Counseled patient on purpose, proper use, and adverse effects of Reclast.  Reviewed adverse events of Reclast including risk of nausea & diarrhea, headache, and muscle & bone pain.  Reviewed rare adverse effect of osteonecrosis of the jaw and advised patient to alert her dentist that she is on Reclast prior to any major dental work.  Patient confirms she does not have any major dental work scheduled at this time.    Reviewed importance of taking calcium and vitamin D with bisphosphonate therapy. Recommended daily amount of calcium is 1200mg   and vitamin D [phone] units.  Advised calcium is better obtained through diet vs supplement.  Counseled about risk of excess calcium supplementation such a kidney stones and increased risk of heart disease.    Provided patient with medication education material and answered all questions. Patient agrees to trial of Reclast IV infusion 5 mg yearly at this time.  Reclast is covered 80% by Medicare Part B and the remaining 20% can be covered by a supplemental plan.  Unable to provide exact co-pay amount as it is billed through medical not pharmacy benefit.  Patient verbalized understanding.    Patient has upcoming dental appt at the end of Jan 2025 and may be having invasive dental work. She will need to be cleared by dentist to proceed with Reclast. Emphasized time sensitivity of transitioning from Forteo to another treatment option to retain bone density growth.  Will need updated CBC, CMP, Vitamin D.  Orders Placed This Encounter  Procedures   VITAMIN D 25 Hydroxy (Vit-D Deficiency, Fractures)   Comprehensive Metabolic Panel (CMET)   CBC with Differential/Platelet   Chesley Mires, PharmD, MPH, BCPS, CPP Clinical Pharmacist (Rheumatology and Pulmonology)

## 2023-06-07 NOTE — Telephone Encounter (Signed)
Pending clearance from dentist after Jan 2025, patient will be transitioning to Reclast from [facility] (already completed treatment). She will need updated CBC and CMP and Vitamin D  Chesley Mires, PharmD, MPH, BCPS, CPP Clinical Pharmacist (Rheumatology and Pulmonology)

## 2023-06-07 NOTE — Patient Instructions (Signed)
Please call our clinic once cleared by dentist to switch to Reclast  Zoledronic Acid Injection (Bone Disorders) What is this medication? ZOLEDRONIC ACID (ZOE le dron ik AS id) prevents and treats osteoporosis. It may also be used to treat Paget's disease of the bone. It works by Interior and spatial designer stronger and less likely to break (fracture). It belongs to a group of medications called bisphosphonates. This medicine may be used for other purposes; ask your health care provider or pharmacist if you have questions. COMMON BRAND NAME(S): Reclast What should I tell my care team before I take this medication? They need to know if you have any of these conditions: Bleeding disorder Cancer Dental disease Kidney disease Low levels of calcium in the blood Low red blood cell counts Lung or breathing disease, such as asthma Receiving steroids, such as dexamethasone or prednisone An unusual or allergic reaction to zoledronic acid, other medications, foods, dyes, or preservatives Pregnant or trying to get pregnant Breast-feeding How should I use this medication? This medication is injected into a vein. It is given by your care team in a hospital or clinic setting. A special MedGuide will be given to you before each treatment. Be sure to read this information carefully each time. Talk to your care team about the use of this medication in children. Special care may be needed. Overdosage: If you think you have taken too much of this medicine contact a poison control center or emergency room at once. NOTE: This medicine is only for you. Do not share this medicine with others. What if I miss a dose? Keep appointments for follow-up doses. It is important not to miss your dose. Call your care team if you are unable to keep an appointment. What may interact with this medication? Certain antibiotics given by injection Medications for pain and inflammation, such as ibuprofen, naproxen, NSAIDs Some diuretics,  such as bumetanide, furosemide Teriparatide This list may not describe all possible interactions. Give your health care provider a list of all the medicines, herbs, non-prescription drugs, or dietary supplements you use. Also tell them if you smoke, drink alcohol, or use illegal drugs. Some items may interact with your medicine. What should I watch for while using this medication? Visit your care team for regular checks on your progress. It may be some time before you see the benefit from this medication. Some people who take this medication have severe bone, joint, or muscle pain. This medication may also increase your risk for jaw problems or a broken thigh bone. Tell your care team right away if you have severe pain in your jaw, bones, joints, or muscles. Tell your care team if you have any pain that does not go away or that gets worse. You should make sure you get enough calcium and vitamin D while you are taking this medication. Discuss the foods you eat and the vitamins you take with your care team. You may need bloodwork while taking this medication. Tell your dentist and dental surgeon that you are taking this medication. You should not have major dental surgery while on this medication. See your dentist to have a dental exam and fix any dental problems before starting this medication. Take good care of your teeth while on this medication. Make sure you see your dentist for regular follow-up appointments. What side effects may I notice from receiving this medication? Side effects that you should report to your care team as soon as possible: Allergic reactions--skin rash, itching, hives, swelling of  the face, lips, tongue, or throat Kidney injury--decrease in the amount of urine, swelling of the ankles, hands, or feet Low calcium level--muscle pain or cramps, confusion, tingling, or numbness in the hands or feet Osteonecrosis of the jaw--pain, swelling, or redness in the mouth, numbness of the  jaw, poor healing after dental work, unusual discharge from the mouth, visible bones in the mouth Severe bone, joint, or muscle pain Side effects that usually do not require medical attention (report to your care team if they continue or are bothersome): Diarrhea Dizziness Headache Nausea Stomach pain Vomiting This list may not describe all possible side effects. Call your doctor for medical advice about side effects. You may report side effects to FDA at 1-800-FDA-1088. Where should I keep my medication? This medication is given in a hospital or clinic. It will not be stored at home. NOTE: This sheet is a summary. It may not cover all possible information. If you have questions about this medicine, talk to your doctor, pharmacist, or health care provider.  2024 Elsevier/Gold Standard (2021-08-21 00:00:00)

## 2023-06-23 ENCOUNTER — Ambulatory Visit: Payer: Medicare PPO | Admitting: Neurology

## 2023-06-23 DIAGNOSIS — G43709 Chronic migraine without aura, not intractable, without status migrainosus: Secondary | ICD-10-CM

## 2023-06-23 DIAGNOSIS — G40909 Epilepsy, unspecified, not intractable, without status epilepticus: Secondary | ICD-10-CM

## 2023-06-23 MED ORDER — ONABOTULINUMTOXINA 200 UNITS IJ SOLR
155.0000 [IU] | Freq: Once | INTRAMUSCULAR | Status: AC
  Administered 2023-06-23: 155 [IU] via INTRAMUSCULAR

## 2023-06-23 NOTE — Patient Instructions (Addendum)
Consent Form Botulism Toxin Injection For Chronic Migraine  Interval history 06/09/2021: stable. No corrugators or procerus. Very high frontalis. .>60% improvement in frequency of migraines, Baseline 15 severemigraine days a month and daily headaches.  Despite getting significant benefit 50% patient still has a migraine days a month and 15 total headache days a month still concerning her chronic migraines at this point will layer Ajovy on top of the Botox to see if we can ameliorate the remainder of her migraines and headaches. Meds ordered this encounter  Medications   botulinum toxin Type A (BOTOX) injection 155 Units    Botox- 200 units x 1 vial Lot: [id number] Expiration: 03/2025 NDC: 4696-2952-84 B/B   Fremanezumab-vfrm (AJOVY) 225 MG/1.5ML SOAJ    Sig: Inject 225 mg into the skin every 30 (thirty) days.    Dispense:  1.5 mL    Refill:  11     03/22/2023 still doing well on botox; Migraines improved > 50% freq and severity with botox.  12/22/2022: Ask [facility] to see, she wants to come off of dilantin. As far as migraines she is stable.   09/29/2022: Migraines improved > 50% freq and severity with botox. Try to get vyepti approved. She gets > 50% improvement with botox but still has >8 migraine days a month and > 15 total headache days a month.   03/16/2022: stable; >70% improvement in frequency of migraines, Baseline 15 severemigraine days a month and daily headaches. No corrugators or procerus. Very high frontalis. Gave nurtec samples.   12/01/2021: stable  09/08/2021: stable  Interval history 06/09/2021: stable. No corrugators or procerus. Very high frontalis.  Broke her patella in a wheelchair now.>70% improvement in frequency of migraines, Baseline 15 severemigraine days a month and daily headaches.  Interval history 03/10/2021: stable. No corrugators or procerus. Very high frontalis. Rest in traps. Broke her patella in a wheelchair now.  Interval history 12/01/2020: stable  Send  to Dr. Vickey Huger, intractable headches, morning headaches: Intractable headaches, wakes with headaches, has headaches when she wakes in the morning, hx of strokes: sleep evaluation  Imitrex injections: Patient has intractable chronic migraines, the best thing that is ever worked for her is Imitrex injections, we have tried multiple alternative medications including analgesics, Nurtec, Ubrelvy, Reyvow, and multiple other for second and third line acute management medications.  Patient had stroke, at that time we took her off her Imitrex, today she implore only request to have it back, she understands the risks, we did review the risks and discussed that sometimes life is about weighing the benefits versus those risks, at this time I will prescribe them for her, she is fully aware of the risk that she is taking with a history of vascular disease.  Interval history 02/12/2020: Migraines fluctuate with her eye problems, she sees cornea specialists and retina specialists and they may install long term steroid release long term in the eyes. Las botox she had only 4 migraine days a month taken care of by imitrex, significant.  >70% improvement in frequency of migraines, Baseline 15 severemigraine days a month and daily headaches. No clenching.  She absolutely loved  .Dry needling: cervical myofascial pain, Send back to dry needling - [address] for cervical myofascial pain syndrome. Gave samples for Nurtec for her to try as well acutely at last visit but she did not like it.  Tried: VPA, Topamax, antidepressants, norvasc, phenergan, frova, aimovig, ajovy, fioricet, (save 6 As), verapamil    Reviewed orally with patient, additionally signature is  on file:  Botulism toxin has been approved by the Federal drug administration for treatment of chronic migraine. Botulism toxin does not cure chronic migraine and it may not be effective in some patients.  The administration of botulism toxin is accomplished by  injecting a small amount of toxin into the muscles of the neck and head. Dosage must be titrated for each individual. Any benefits resulting from botulism toxin tend to wear off after 3 months with a repeat injection required if benefit is to be maintained. Injections are usually done every 3-4 months with maximum effect peak achieved by about 2 or 3 weeks. Botulism toxin is expensive and you should be sure of what costs you will incur resulting from the injection.  The side effects of botulism toxin use for chronic migraine may include:   -Transient, and usually mild, facial weakness with facial injections  -Transient, and usually mild, head or neck weakness with head/neck injections  -Reduction or loss of forehead facial animation due to forehead muscle weakness  -Eyelid drooping  -Dry eye  -Pain at the site of injection or bruising at the site of injection  -Double vision  -Potential unknown long term risks  Contraindications: You should not have Botox if you are pregnant, nursing, allergic to albumin, have an infection, skin condition, or muscle weakness at the site of the injection, or have myasthenia gravis, Lambert-Eaton syndrome, or ALS.  It is also possible that as with any injection, there may be an allergic reaction or no effect from the medication. Reduced effectiveness after repeated injections is sometimes seen and rarely infection at the injection site may occur. All care will be taken to prevent these side effects. If therapy is given over a long time, atrophy and wasting in the muscle injected may occur. Occasionally the patient's become refractory to treatment because they develop antibodies to the toxin. In this event, therapy needs to be modified.  I have read the above information and consent to the administration of botulism toxin.    BOTOX PROCEDURE NOTE FOR MIGRAINE HEADACHE    Contraindications and precautions discussed with patient(above). Aseptic procedure was  observed and patient tolerated procedure. Procedure performed by Dr. Artemio Aly  The condition has existed for more than 6 months, and pt does not have a diagnosis of ALS, Myasthenia Gravis or Lambert-Eaton Syndrome.  Risks and benefits of injections discussed and pt agrees to proceed with the procedure.  Written consent obtained  These injections are medically necessary. Pt  receives good benefits from these injections. These injections do not cause sedations or hallucinations which the oral therapies may cause.  Description of procedure:  The patient was placed in a sitting position. The standard protocol was used for Botox as follows, with 5 units of Botox injected at each site:   -Procerus muscle, midline injection DID NOT PERFORM  -Corrugator muscle, bilateral injection DID NOT PERFORM  -Frontalis muscle, bilateral injection, with 2 sites each side, medial injection was performed in the upper one third of the frontalis muscle, in the region vertical from the medial inferior edge of the superior orbital rim. The lateral injection was again in the upper one third of the forehead vertically above the lateral limbus of the cornea, 1.5 cm lateral to the medial injection site.  -Temporalis muscle injection, 4 sites, bilaterally. The first injection was 3 cm above the tragus of the ear, second injection site was 1.5 cm to 3 cm up from the first injection site in line with the  tragus of the ear. The third injection site was 1.5-3 cm forward between the first 2 injection sites. The fourth injection site was 1.5 cm posterior to the second injection site.   -Occipitalis muscle injection, 3 sites, bilaterally. The first injection was done one half way between the occipital protuberance and the tip of the mastoid process behind the ear. The second injection site was done lateral and superior to the first, 1 fingerbreadth from the first injection. The third injection site was 1 fingerbreadth superiorly and  medially from the first injection site.  -Cervical paraspinal muscle injection, 2 sites, bilateral knee first injection site was 1 cm from the midline of the cervical spine, 3 cm inferior to the lower border of the occipital protuberance. The second injection site was 1.5 cm superiorly and laterally to the first injection site.  -Trapezius muscle injection was performed at 3 sites, bilaterally. The first injection site was in the upper trapezius muscle halfway between the inflection point of the neck, and the acromion. The second injection site was one half way between the acromion and the first injection site. The third injection was done between the first injection site and the inflection point of the neck.   Will return for repeat injection in 3 months.   140 units of Botox was used, 60U Botox not injected was wasted. The patient tolerated the procedure well, there were no complications of the above procedure.

## 2023-06-23 NOTE — Progress Notes (Signed)
Consent Form Botulism Toxin Injection For Chronic Migraine  Try to get ajovy approved     03/22/2023 still doing well on botox; Migraines improved > 50% freq and severity with botox.  12/22/2022: Ask [facility] to see, she wants to come off of dilantin. As far as migraines she is stable.   09/29/2022: Migraines improved > 50% freq and severity with botox. Try to get vyepti approved. She gets > 50% improvement with botox but still has >8 migraine days a month and > 15 total headache days a month.   03/16/2022: stable; >70% improvement in frequency of migraines, Baseline 15 severemigraine days a month and daily headaches. No corrugators or procerus. Very high frontalis. Gave nurtec samples.   12/01/2021: stable  09/08/2021: stable  Interval history 06/09/2021: stable. No corrugators or procerus. Very high frontalis.  Broke her patella in a wheelchair now.>70% improvement in frequency of migraines, Baseline 15 severemigraine days a month and daily headaches.  Interval history 03/10/2021: stable. No corrugators or procerus. Very high frontalis. Rest in traps. Broke her patella in a wheelchair now.  Interval history 12/01/2020: stable  Send to Dr. Vickey Huger, intractable headches, morning headaches: Intractable headaches, wakes with headaches, has headaches when she wakes in the morning, hx of strokes: sleep evaluation  Imitrex injections: Patient has intractable chronic migraines, the best thing that is ever worked for her is Imitrex injections, we have tried multiple alternative medications including analgesics, Nurtec, Ubrelvy, Reyvow, and multiple other for second and third line acute management medications.  Patient had stroke, at that time we took her off her Imitrex, today she implore only request to have it back, she understands the risks, we did review the risks and discussed that sometimes life is about weighing the benefits versus those risks, at this time I will prescribe them for her, she  is fully aware of the risk that she is taking with a history of vascular disease.  Interval history 02/12/2020: Migraines fluctuate with her eye problems, she sees cornea specialists and retina specialists and they may install long term steroid release long term in the eyes. Las botox she had only 4 migraine days a month taken care of by imitrex, significant.  >70% improvement in frequency of migraines, Baseline 15 severemigraine days a month and daily headaches. No clenching.  She absolutely loved  .Dry needling: cervical myofascial pain, Send back to dry needling - [address] for cervical myofascial pain syndrome. Gave samples for Nurtec for her to try as well acutely at last visit but she did not like it.  Tried: VPA, Topamax, antidepressants, norvasc, phenergan, frova, aimovig, ajovy, fioricet, (save 6 As), verapamil    Reviewed orally with patient, additionally signature is on file:  Botulism toxin has been approved by the Federal drug administration for treatment of chronic migraine. Botulism toxin does not cure chronic migraine and it may not be effective in some patients.  The administration of botulism toxin is accomplished by injecting a small amount of toxin into the muscles of the neck and head. Dosage must be titrated for each individual. Any benefits resulting from botulism toxin tend to wear off after 3 months with a repeat injection required if benefit is to be maintained. Injections are usually done every 3-4 months with maximum effect peak achieved by about 2 or 3 weeks. Botulism toxin is expensive and you should be sure of what costs you will incur resulting from the injection.  The side effects of botulism toxin use for chronic  migraine may include:   -Transient, and usually mild, facial weakness with facial injections  -Transient, and usually mild, head or neck weakness with head/neck injections  -Reduction or loss of forehead facial animation due to forehead muscle  weakness  -Eyelid drooping  -Dry eye  -Pain at the site of injection or bruising at the site of injection  -Double vision  -Potential unknown long term risks  Contraindications: You should not have Botox if you are pregnant, nursing, allergic to albumin, have an infection, skin condition, or muscle weakness at the site of the injection, or have myasthenia gravis, Lambert-Eaton syndrome, or ALS.  It is also possible that as with any injection, there may be an allergic reaction or no effect from the medication. Reduced effectiveness after repeated injections is sometimes seen and rarely infection at the injection site may occur. All care will be taken to prevent these side effects. If therapy is given over a long time, atrophy and wasting in the muscle injected may occur. Occasionally the patient's become refractory to treatment because they develop antibodies to the toxin. In this event, therapy needs to be modified.  I have read the above information and consent to the administration of botulism toxin.    BOTOX PROCEDURE NOTE FOR MIGRAINE HEADACHE    Contraindications and precautions discussed with patient(above). Aseptic procedure was observed and patient tolerated procedure. Procedure performed by Dr. Artemio Aly  The condition has existed for more than 6 months, and pt does not have a diagnosis of ALS, Myasthenia Gravis or Lambert-Eaton Syndrome.  Risks and benefits of injections discussed and pt agrees to proceed with the procedure.  Written consent obtained  These injections are medically necessary. Pt  receives good benefits from these injections. These injections do not cause sedations or hallucinations which the oral therapies may cause.  Description of procedure:  The patient was placed in a sitting position. The standard protocol was used for Botox as follows, with 5 units of Botox injected at each site:   -Procerus muscle, midline injection DID NOT PERFORM  -Corrugator  muscle, bilateral injection DID NOT PERFORM  -Frontalis muscle, bilateral injection, with 2 sites each side, medial injection was performed in the upper one third of the frontalis muscle, in the region vertical from the medial inferior edge of the superior orbital rim. The lateral injection was again in the upper one third of the forehead vertically above the lateral limbus of the cornea, 1.5 cm lateral to the medial injection site.  -Temporalis muscle injection, 4 sites, bilaterally. The first injection was 3 cm above the tragus of the ear, second injection site was 1.5 cm to 3 cm up from the first injection site in line with the tragus of the ear. The third injection site was 1.5-3 cm forward between the first 2 injection sites. The fourth injection site was 1.5 cm posterior to the second injection site.   -Occipitalis muscle injection, 3 sites, bilaterally. The first injection was done one half way between the occipital protuberance and the tip of the mastoid process behind the ear. The second injection site was done lateral and superior to the first, 1 fingerbreadth from the first injection. The third injection site was 1 fingerbreadth superiorly and medially from the first injection site.  -Cervical paraspinal muscle injection, 2 sites, bilateral knee first injection site was 1 cm from the midline of the cervical spine, 3 cm inferior to the lower border of the occipital protuberance. The second injection site was 1.5 cm superiorly  and laterally to the first injection site.  -Trapezius muscle injection was performed at 3 sites, bilaterally. The first injection site was in the upper trapezius muscle halfway between the inflection point of the neck, and the acromion. The second injection site was one half way between the acromion and the first injection site. The third injection was done between the first injection site and the inflection point of the neck.   Will return for repeat injection in 3  months.   140 units of Botox was used, 60U Botox not injected was wasted. The patient tolerated the procedure well, there were no complications of the above procedure.

## 2023-06-23 NOTE — Progress Notes (Signed)
Botox- 200 units x 1 vial Lot: [id number] Expiration: 03/2025 NDC: 4540-9811-91  Bacteriostatic 0.9% Sodium Chloride- * mL  Lot: [id number] Expiration: 10/18/2023 NDC: 6213-0865-78  Dx: I69.629 B/B Witnessed by Leeann Must RN

## 2023-06-25 MED ORDER — AJOVY 225 MG/1.5ML ~~LOC~~ SOAJ: 225.0000 mg | SUBCUTANEOUS | 11 refills | Status: DC

## 2023-06-30 DIAGNOSIS — Z947 Corneal transplant status: Secondary | ICD-10-CM | POA: Diagnosis not present

## 2023-06-30 DIAGNOSIS — Z9889 Other specified postprocedural states: Secondary | ICD-10-CM | POA: Diagnosis not present

## 2023-06-30 DIAGNOSIS — H30033 Focal chorioretinal inflammation, peripheral, bilateral: Secondary | ICD-10-CM | POA: Diagnosis not present

## 2023-06-30 DIAGNOSIS — H353221 Exudative age-related macular degeneration, left eye, with active choroidal neovascularization: Secondary | ICD-10-CM | POA: Diagnosis not present

## 2023-06-30 DIAGNOSIS — H401112 Primary open-angle glaucoma, right eye, moderate stage: Secondary | ICD-10-CM | POA: Diagnosis not present

## 2023-06-30 DIAGNOSIS — Z79899 Other long term (current) drug therapy: Secondary | ICD-10-CM | POA: Diagnosis not present

## 2023-06-30 DIAGNOSIS — H401123 Primary open-angle glaucoma, left eye, severe stage: Secondary | ICD-10-CM | POA: Diagnosis not present

## 2023-06-30 DIAGNOSIS — H3581 Retinal edema: Secondary | ICD-10-CM | POA: Diagnosis not present

## 2023-06-30 DIAGNOSIS — Z961 Presence of intraocular lens: Secondary | ICD-10-CM | POA: Diagnosis not present

## 2023-07-08 DIAGNOSIS — H401112 Primary open-angle glaucoma, right eye, moderate stage: Secondary | ICD-10-CM | POA: Diagnosis not present

## 2023-07-08 DIAGNOSIS — H30033 Focal chorioretinal inflammation, peripheral, bilateral: Secondary | ICD-10-CM | POA: Diagnosis not present

## 2023-07-08 DIAGNOSIS — H353221 Exudative age-related macular degeneration, left eye, with active choroidal neovascularization: Secondary | ICD-10-CM | POA: Diagnosis not present

## 2023-07-08 DIAGNOSIS — Z947 Corneal transplant status: Secondary | ICD-10-CM | POA: Diagnosis not present

## 2023-07-08 DIAGNOSIS — H401123 Primary open-angle glaucoma, left eye, severe stage: Secondary | ICD-10-CM | POA: Diagnosis not present

## 2023-07-08 DIAGNOSIS — H04123 Dry eye syndrome of bilateral lacrimal glands: Secondary | ICD-10-CM | POA: Diagnosis not present

## 2023-07-08 DIAGNOSIS — Z961 Presence of intraocular lens: Secondary | ICD-10-CM | POA: Diagnosis not present

## 2023-07-11 ENCOUNTER — Other Ambulatory Visit: Payer: Self-pay | Admitting: Neurology

## 2023-07-11 DIAGNOSIS — G43719 Chronic migraine without aura, intractable, without status migrainosus: Secondary | ICD-10-CM

## 2023-07-26 DIAGNOSIS — H30102 Unspecified disseminated chorioretinal inflammation, left eye: Secondary | ICD-10-CM | POA: Diagnosis not present

## 2023-07-26 DIAGNOSIS — Z947 Corneal transplant status: Secondary | ICD-10-CM | POA: Diagnosis not present

## 2023-07-26 DIAGNOSIS — H401112 Primary open-angle glaucoma, right eye, moderate stage: Secondary | ICD-10-CM | POA: Diagnosis not present

## 2023-07-26 DIAGNOSIS — H401123 Primary open-angle glaucoma, left eye, severe stage: Secondary | ICD-10-CM | POA: Diagnosis not present

## 2023-07-26 DIAGNOSIS — Z79899 Other long term (current) drug therapy: Secondary | ICD-10-CM | POA: Diagnosis not present

## 2023-07-26 DIAGNOSIS — H30033 Focal chorioretinal inflammation, peripheral, bilateral: Secondary | ICD-10-CM | POA: Diagnosis not present

## 2023-07-26 DIAGNOSIS — H3581 Retinal edema: Secondary | ICD-10-CM | POA: Diagnosis not present

## 2023-07-26 DIAGNOSIS — H353221 Exudative age-related macular degeneration, left eye, with active choroidal neovascularization: Secondary | ICD-10-CM | POA: Diagnosis not present

## 2023-07-28 DIAGNOSIS — H30033 Focal chorioretinal inflammation, peripheral, bilateral: Secondary | ICD-10-CM | POA: Diagnosis not present

## 2023-07-28 DIAGNOSIS — Z79899 Other long term (current) drug therapy: Secondary | ICD-10-CM | POA: Diagnosis not present

## 2023-08-11 DIAGNOSIS — H401112 Primary open-angle glaucoma, right eye, moderate stage: Secondary | ICD-10-CM | POA: Diagnosis not present

## 2023-08-11 DIAGNOSIS — H401123 Primary open-angle glaucoma, left eye, severe stage: Secondary | ICD-10-CM | POA: Diagnosis not present

## 2023-08-17 DIAGNOSIS — R946 Abnormal results of thyroid function studies: Secondary | ICD-10-CM | POA: Diagnosis not present

## 2023-08-17 DIAGNOSIS — E7800 Pure hypercholesterolemia, unspecified: Secondary | ICD-10-CM | POA: Diagnosis not present

## 2023-08-17 DIAGNOSIS — R7309 Other abnormal glucose: Secondary | ICD-10-CM | POA: Diagnosis not present

## 2023-08-17 DIAGNOSIS — I10 Essential (primary) hypertension: Secondary | ICD-10-CM | POA: Diagnosis not present

## 2023-08-17 DIAGNOSIS — G40909 Epilepsy, unspecified, not intractable, without status epilepticus: Secondary | ICD-10-CM | POA: Diagnosis not present

## 2023-08-17 DIAGNOSIS — E559 Vitamin D deficiency, unspecified: Secondary | ICD-10-CM | POA: Diagnosis not present

## 2023-08-19 LAB — LAB REPORT - SCANNED
A1c: 5.7
EGFR (Non-African Amer.): 79
TSH: 1.19 (ref 0.41–5.90)

## 2023-08-22 ENCOUNTER — Telehealth: Payer: Self-pay | Admitting: Neurology

## 2023-08-22 DIAGNOSIS — F419 Anxiety disorder, unspecified: Secondary | ICD-10-CM | POA: Diagnosis not present

## 2023-08-22 DIAGNOSIS — R7303 Prediabetes: Secondary | ICD-10-CM | POA: Diagnosis not present

## 2023-08-22 DIAGNOSIS — G43909 Migraine, unspecified, not intractable, without status migrainosus: Secondary | ICD-10-CM | POA: Diagnosis not present

## 2023-08-22 DIAGNOSIS — Z0000 Encounter for general adult medical examination without abnormal findings: Secondary | ICD-10-CM | POA: Diagnosis not present

## 2023-08-22 DIAGNOSIS — G40909 Epilepsy, unspecified, not intractable, without status epilepticus: Secondary | ICD-10-CM | POA: Diagnosis not present

## 2023-08-22 DIAGNOSIS — H3530 Unspecified macular degeneration: Secondary | ICD-10-CM | POA: Diagnosis not present

## 2023-08-22 DIAGNOSIS — Z79899 Other long term (current) drug therapy: Secondary | ICD-10-CM | POA: Diagnosis not present

## 2023-08-22 DIAGNOSIS — I10 Essential (primary) hypertension: Secondary | ICD-10-CM | POA: Diagnosis not present

## 2023-08-22 DIAGNOSIS — Z9889 Other specified postprocedural states: Secondary | ICD-10-CM | POA: Diagnosis not present

## 2023-08-22 NOTE — Telephone Encounter (Signed)
Completed [employer] PA via CMM, status is pending. Key: [id number]

## 2023-08-23 NOTE — Telephone Encounter (Signed)
auth # [id number] (08/02/19-07/18/24)

## 2023-09-08 ENCOUNTER — Telehealth: Payer: Self-pay | Admitting: Neurology

## 2023-09-08 NOTE — Telephone Encounter (Signed)
Pt cancelled appointment due to having the flu. Will call back to reschedule

## 2023-09-09 ENCOUNTER — Ambulatory Visit: Payer: Medicare PPO | Admitting: Neurology

## 2023-09-13 ENCOUNTER — Telehealth: Payer: Self-pay | Admitting: Rheumatology

## 2023-09-13 ENCOUNTER — Other Ambulatory Visit: Payer: Self-pay | Admitting: Neurology

## 2023-09-13 NOTE — Telephone Encounter (Signed)
 Attempted to contact the patient and left message advising patient we will need a clearance letter from her dentist. Advised patient to have letter faxed to our office.

## 2023-09-13 NOTE — Telephone Encounter (Signed)
 Ok to obtain clearance letter from dentist.  Thank you

## 2023-09-13 NOTE — Telephone Encounter (Signed)
 Patient called stating Dr. Corliss Skains wanted her to notify the office when she completed her dental work so she can schedule the infusions for her osteoporosis.  Patient requested a return call.

## 2023-09-15 ENCOUNTER — Ambulatory Visit: Payer: Medicare PPO | Admitting: Neurology

## 2023-09-15 DIAGNOSIS — G43709 Chronic migraine without aura, not intractable, without status migrainosus: Secondary | ICD-10-CM | POA: Diagnosis not present

## 2023-09-15 DIAGNOSIS — G43711 Chronic migraine without aura, intractable, with status migrainosus: Secondary | ICD-10-CM

## 2023-09-15 MED ORDER — ONABOTULINUMTOXINA 200 UNITS IJ SOLR
155.0000 [IU] | Freq: Once | INTRAMUSCULAR | Status: AC
  Administered 2023-09-15: 155 [IU] via INTRAMUSCULAR

## 2023-09-15 NOTE — Progress Notes (Unsigned)
 Consent Form Botulism Toxin Injection For Chronic Migraine  Try to get ajovy approved     03/22/2023 still doing well on botox; Migraines improved > 50% freq and severity with botox.  12/22/2022: Ask [facility] to see, she wants to come off of dilantin. As far as migraines she is stable.   09/29/2022: Migraines improved > 50% freq and severity with botox. Try to get vyepti approved. She gets > 50% improvement with botox but still has >8 migraine days a month and > 15 total headache days a month.   03/16/2022: stable; >70% improvement in frequency of migraines, Baseline 15 severemigraine days a month and daily headaches. No corrugators or procerus. Very high frontalis. Gave nurtec samples.   12/01/2021: stable  09/08/2021: stable  Interval history 06/09/2021: stable. No corrugators or procerus. Very high frontalis.  Broke her patella in a wheelchair now.>70% improvement in frequency of migraines, Baseline 15 severemigraine days a month and daily headaches.  Interval history 03/10/2021: stable. No corrugators or procerus. Very high frontalis. Rest in traps. Broke her patella in a wheelchair now.  Interval history 12/01/2020: stable  Send to Dr. Vickey Huger, intractable headches, morning headaches: Intractable headaches, wakes with headaches, has headaches when she wakes in the morning, hx of strokes: sleep evaluation  Imitrex injections: Patient has intractable chronic migraines, the best thing that is ever worked for her is Imitrex injections, we have tried multiple alternative medications including analgesics, Nurtec, Ubrelvy, Reyvow, and multiple other for second and third line acute management medications.  Patient had stroke, at that time we took her off her Imitrex, today she implore only request to have it back, she understands the risks, we did review the risks and discussed that sometimes life is about weighing the benefits versus those risks, at this time I will prescribe them for her, she  is fully aware of the risk that she is taking with a history of vascular disease.  Interval history 02/12/2020: Migraines fluctuate with her eye problems, she sees cornea specialists and retina specialists and they may install long term steroid release long term in the eyes. Las botox she had only 4 migraine days a month taken care of by imitrex, significant.  >70% improvement in frequency of migraines, Baseline 15 severemigraine days a month and daily headaches. No clenching.  She absolutely loved  .Dry needling: cervical myofascial pain, Send back to dry needling - [address] for cervical myofascial pain syndrome. Gave samples for Nurtec for her to try as well acutely at last visit but she did not like it.  Tried: VPA, Topamax, antidepressants, norvasc, phenergan, frova, aimovig, ajovy, fioricet, (save 6 As), verapamil    Reviewed orally with patient, additionally signature is on file:  Botulism toxin has been approved by the Federal drug administration for treatment of chronic migraine. Botulism toxin does not cure chronic migraine and it may not be effective in some patients.  The administration of botulism toxin is accomplished by injecting a small amount of toxin into the muscles of the neck and head. Dosage must be titrated for each individual. Any benefits resulting from botulism toxin tend to wear off after 3 months with a repeat injection required if benefit is to be maintained. Injections are usually done every 3-4 months with maximum effect peak achieved by about 2 or 3 weeks. Botulism toxin is expensive and you should be sure of what costs you will incur resulting from the injection.  The side effects of botulism toxin use for chronic  migraine may include:   -Transient, and usually mild, facial weakness with facial injections  -Transient, and usually mild, head or neck weakness with head/neck injections  -Reduction or loss of forehead facial animation due to forehead muscle  weakness  -Eyelid drooping  -Dry eye  -Pain at the site of injection or bruising at the site of injection  -Double vision  -Potential unknown long term risks  Contraindications: You should not have Botox if you are pregnant, nursing, allergic to albumin, have an infection, skin condition, or muscle weakness at the site of the injection, or have myasthenia gravis, Lambert-Eaton syndrome, or ALS.  It is also possible that as with any injection, there may be an allergic reaction or no effect from the medication. Reduced effectiveness after repeated injections is sometimes seen and rarely infection at the injection site may occur. All care will be taken to prevent these side effects. If therapy is given over a long time, atrophy and wasting in the muscle injected may occur. Occasionally the patient's become refractory to treatment because they develop antibodies to the toxin. In this event, therapy needs to be modified.  I have read the above information and consent to the administration of botulism toxin.    BOTOX PROCEDURE NOTE FOR MIGRAINE HEADACHE    Contraindications and precautions discussed with patient(above). Aseptic procedure was observed and patient tolerated procedure. Procedure performed by Dr. Artemio Aly  The condition has existed for more than 6 months, and pt does not have a diagnosis of ALS, Myasthenia Gravis or Lambert-Eaton Syndrome.  Risks and benefits of injections discussed and pt agrees to proceed with the procedure.  Written consent obtained  These injections are medically necessary. Pt  receives good benefits from these injections. These injections do not cause sedations or hallucinations which the oral therapies may cause.  Description of procedure:  The patient was placed in a sitting position. The standard protocol was used for Botox as follows, with 5 units of Botox injected at each site:   -Procerus muscle, midline injection DID NOT PERFORM  -Corrugator  muscle, bilateral injection DID NOT PERFORM  -Frontalis muscle, bilateral injection, with 2 sites each side, medial injection was performed in the upper one third of the frontalis muscle, in the region vertical from the medial inferior edge of the superior orbital rim. The lateral injection was again in the upper one third of the forehead vertically above the lateral limbus of the cornea, 1.5 cm lateral to the medial injection site.  -Temporalis muscle injection, 4 sites, bilaterally. The first injection was 3 cm above the tragus of the ear, second injection site was 1.5 cm to 3 cm up from the first injection site in line with the tragus of the ear. The third injection site was 1.5-3 cm forward between the first 2 injection sites. The fourth injection site was 1.5 cm posterior to the second injection site.   -Occipitalis muscle injection, 3 sites, bilaterally. The first injection was done one half way between the occipital protuberance and the tip of the mastoid process behind the ear. The second injection site was done lateral and superior to the first, 1 fingerbreadth from the first injection. The third injection site was 1 fingerbreadth superiorly and medially from the first injection site.  -Cervical paraspinal muscle injection, 2 sites, bilateral knee first injection site was 1 cm from the midline of the cervical spine, 3 cm inferior to the lower border of the occipital protuberance. The second injection site was 1.5 cm superiorly  and laterally to the first injection site.  -Trapezius muscle injection was performed at 3 sites, bilaterally. The first injection site was in the upper trapezius muscle halfway between the inflection point of the neck, and the acromion. The second injection site was one half way between the acromion and the first injection site. The third injection was done between the first injection site and the inflection point of the neck.   Will return for repeat injection in 3  months.   140 units of Botox was used, 60U Botox not injected was wasted. The patient tolerated the procedure well, there were no complications of the above procedure.

## 2023-09-15 NOTE — Progress Notes (Unsigned)
 Botox- 200 units x 1 vial Lot: [id number] Expiration: 10/2025 NDC: 1610-9604-54  Bacteriostatic 0.9% Sodium Chloride- * mL  Lot: [id number] Expiration: 05/19/2024 NDC: 1478-2956-21  Dx: 34.709 B/B Witnessed by Alverda Skeans, RN

## 2023-09-21 NOTE — Progress Notes (Unsigned)
 Office Visit Note  [name]             Date of Birth: 11-Jul-1951           MRN: [id number]             PCP: Irena Reichmann, DO Referring: Irena Reichmann, DO Visit Date: 10/05/2023 Occupation: @GUAROCC @  Subjective:  Issues with balance   History of Present Illness: Brianna [name] is a 73 y.o. female with history of seropositive rheumatoid arthritis, iritis, and ILD. Patient remains on  Arava 20 mg 1 tablet by mouth daily and Humira 40 mg sq every 14 days prescribed by Dr. Sherryll Burger.  She is tolerating combination therapy without any side effects.  According to the patient about 3 weeks ago she was diagnosed with norovirus.  Patient hold Humira for 1 week but continue to take arava as prescribed.  Patient remains under the care of Dr. Yehuda Budd, Dr. Lottie Dawson, and Dr. Sherryll Burger for management of iritis and glaucoma.  Patient states that she has been having increased issues with balance.  Patient states that she feels herself veering left and has to correct herself to walk straight.  She has an upcoming appointment with Dr. Lucia Gaskins for further evaluation.   Patient experiences intermittent discomfort in the cervical spine.  She is not planning to proceed with surgical intervention.  She has had some increased discomfort in her left shoulder and has ongoing pain in the left thumb.  She denies any joint swelling at this time.   Activities of Daily Living:  Patient reports morning stiffness for 15 minutes.   Patient Reports nocturnal pain.  Difficulty dressing/grooming: Denies Difficulty climbing stairs: Denies Difficulty getting out of chair: Denies Difficulty using hands for taps, buttons, cutlery, and/or writing: Reports  Review of Systems  Constitutional:  Positive for fatigue.  HENT:  Positive for mouth dryness and nose dryness. Negative for mouth sores.   Eyes:  Positive for dryness. Negative for pain.  Respiratory:  Negative for shortness of breath and difficulty breathing.    Cardiovascular:  Negative for chest pain and palpitations.  Gastrointestinal:  Positive for constipation. Negative for diarrhea.  Endocrine: Negative for increased urination.  Genitourinary:  Negative for involuntary urination.  Musculoskeletal:  Positive for joint pain, joint pain, myalgias, morning stiffness, muscle tenderness and myalgias. Negative for gait problem, joint swelling and muscle weakness.  Skin:  Negative for color change, rash, hair loss and sensitivity to sunlight.  Allergic/Immunologic: Negative for susceptible to infections.  Neurological:  Positive for dizziness and headaches.  Hematological:  Negative for swollen glands.  Psychiatric/Behavioral:  Positive for depressed mood. Negative for sleep disturbance. The patient is nervous/anxious.     PMFS History:  Patient Active Problem List   Diagnosis Date Noted   Gastroesophageal reflux disease without esophagitis 03/24/2022   Altered taste 03/24/2022   Primary osteoarthritis of first carpometacarpal joint of left hand 12/18/2021   Coronary artery disease involving native coronary artery of native heart without angina pectoris 11/23/2021   Rhinitis, chronic 09/23/2021   Closed fracture of left patella 06/22/2021   Low back pain 05/28/2021   Displacement of lumbar intervertebral disc without myelopathy 05/28/2021   Episodic cluster headache, not intractable 03/24/2021   Morning headache 03/24/2021   Cerebrovascular accident (CVA) (HCC) 03/24/2021   Chronic migraine without aura, intractable, without status migrainosus 03/24/2021   Patellar fracture 02/11/2021   Dyslipidemia 02/11/2021   Pain of left thumb 02/02/2021   Anxiety 08/28/2020   Carotid  artery disease (HCC) 08/28/2020   Late effects of cerebrovascular disease 08/28/2020   Macular degeneration 08/28/2020   Other long term (current) drug therapy 08/28/2020   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   Pure hypercholesterolemia 08/28/2020   Vitamin D  deficiency 08/28/2020   Exertional chest pain 06/24/2020   Hypertension 05/12/2020   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   Acute urinary tract infection 11/13/2019   Epilepsy (HCC) 11/13/2019   Corneal ulcer of left eye 12/05/2018   Acute left eye pain 12/05/2018   Endophthalmitis, acute, left 12/05/2018   Ulcer of left cornea 12/05/2018   URI, acute 05/19/2018   Cerumen impaction 05/19/2018   Primary open angle glaucoma (POAG) of left eye, severe stage 02/16/2018   Laceration of left middle finger without foreign body without damage to nail 11/23/2017   Trigger thumb, right thumb 10/05/2017   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   Numbness 04/25/2017   Pain 04/25/2017   Primary open angle glaucoma (POAG) of right eye, moderate stage 04/15/2017   Bilateral impacted cerumen 01/31/2017   History of macular degeneration 09/13/2016   History of migraine 09/06/2016   History of seizures 09/06/2016   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   Secondary corneal edema of right eye 06/28/2016   Lumbar radiculopathy 05/24/2016   Degeneration of lumbosacral intervertebral disc 05/24/2016   Iritis of right eye 05/12/2016   Rheumatoid arthritis with positive rheumatoid factor (HCC) 05/11/2016   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   High risk medication use 05/11/2016   Osteoporosis 05/11/2016   ILD (interstitial lung disease) (HCC) 05/11/2016   Intractable chronic migraine without aura and without status migrainosus 04/02/2016   Intractable chronic migraine without aura 04/02/2016   Peripheral focal chorioretinal inflammation of both eyes 01/04/2016   Squamous blepharitis of upper and lower eyelids of both eyes 10/22/2015   Dry eyes, bilateral 10/21/2015   History of high risk medication treatment 04/28/2015   Cystoid macular edema of left eye 09/23/2014   History of carotid endarterectomy 06/27/2014   Acquired myogenic ptosis of both eyelids 06/07/2014   Myogenic  ptosis, acquired 06/07/2014   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   Migraine without aura 04/19/2013   Seizures (HCC) 04/19/2013   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   Keratoconus, 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   Minor corneal opacity 08/02/2011   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   Cough 01/14/2011   Pulmonary fibrosis (HCC) 01/14/2011    Past Medical History:  Diagnosis Date   Arthritis    in the neck   Carotid artery occlusion    Epilepsy (HCC)    GERD (gastroesophageal reflux disease)    Glaucoma    Hyperlipidemia    Hypertension    Keratoconus of both eyes 1981   Macular degeneration    Migraines    Peripheral vascular disease (HCC)    carotid occlusion surgery on left   Raynaud's disease    Retinal edema    Rheumatoid arthritis(714.0)    Stroke (HCC)    two mild strokes presumed from left carotid stenosis    Family History  Problem Relation Age of Onset   Heart disease Mother    Lung disease Mother        ? disease process   Uterine cancer Mother    Heart disease Father    Clotting disorder Father    Collagen disease Father    Cervical cancer Maternal Aunt    Prostate cancer Maternal Grandfather    Rheum arthritis Sister    High Cholesterol Sister  Epilepsy Sister    Rheum arthritis Maternal Grandmother    High Cholesterol Brother    High blood pressure Brother    High blood pressure Brother    High Cholesterol Brother    High blood pressure Brother    High Cholesterol Brother    Past Surgical History:  Procedure Laterality Date   CAROTID ARTERY ANGIOPLASTY  2008   Dr Madilyn Fireman   CAROTID ENDARTERECTOMY Left 11-15-2007   cataract extraction Right 04-2014   [facility]   CHOLECYSTECTOMY  1995   CORNEAL TRANSPLANT     x 5 ; steroid inj. 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   [facility]   CORNEAL TRANSPLANT Left 04/17/2019   EYE SURGERY Right 12/2016   cornea repair    EYE SURGERY Right 09/20/2017   cornea transplant    GLAUCOMA SURGERY Right 2018    ORIF PATELLA Left 02/12/2021   Procedure: OPEN REDUCTION INTERNAL (ORIF) FIXATION PATELLA;  Surgeon: Yolonda Kida, MD;  Location: [facility];  Service: Orthopedics;  Laterality: Left;   TRIGGER FINGER RELEASE Right 05/16/2018   Procedure: RELEASE TRIGGER FINGER/A-1 PULLEY RIGHT THUMB;  Surgeon: Cindee Salt, MD;  Location: [facility];  Service: Orthopedics;  Laterality: Right;   Social History   Social History Narrative   Patient is married to [name]), has 2 children   Patient is right handed   Education level is Bachelor's degree   Caffeine consumption is about 2 cups daily   Lives at home with husband    Immunization History  Administered Date(s) Administered   Fluad Quad(high Dose 65+) 05/14/2022   Influenza Split 04/26/2013, 04/08/2017   Influenza, High Dose Seasonal PF 04/09/2018   Influenza, Quadrivalent, Recombinant, Inj, Pf 04/21/2017, 03/19/2019   Influenza-Unspecified 04/19/2016, 10/29/2020   PFIZER(Purple Top)SARS-COV-2 Vaccination 08/24/2019, 09/18/2019, 05/12/2020, 10/29/2020   Pneumococcal Conjugate-13 08/27/2013, 07/05/2017   Pneumococcal Polysaccharide-23 01/14/2011   Tdap 11/15/2017   Unspecified SARS-COV-2 Vaccination 10/29/2020   Zoster Recombinant(Shingrix) 07/19/2019, 11/28/2019     Objective: Vital Signs: BP (!) 146/89 (BP Location: Left Arm, Patient Position: Sitting, Cuff Size: Normal)   Pulse 73   Resp 13   Ht 5' (1.524 m)   Wt 119 lb (54 kg)   BMI 23.24 kg/m    Physical Exam Vitals and nursing note reviewed.  Constitutional:      Appearance: She is well-developed.  HENT:     Head: Normocephalic and atraumatic.  Eyes:     Conjunctiva/sclera: Conjunctivae normal.  Cardiovascular:     Rate and Rhythm: Normal rate and regular rhythm.     Heart sounds: Normal heart sounds.  Pulmonary:     Effort: Pulmonary effort is normal.     Breath sounds: Normal breath sounds.  Abdominal:     General: Bowel sounds are normal.      Palpations: Abdomen is soft.  Musculoskeletal:     Cervical back: Normal range of motion.  Lymphadenopathy:     Cervical: No cervical adenopathy.  Skin:    General: Skin is warm and dry.     Capillary Refill: Capillary refill takes less than 2 seconds.  Neurological:     Mental Status: She is alert and oriented to person, place, and time.  Psychiatric:        Behavior: Behavior normal.      Musculoskeletal Exam: C-spine has limited range of motion.  Discomfort and limited range of motion of the left shoulder.  Some left shoulder anterior tenderness noted.  Elbow  joints have good range of motion.  Thickening of both CMC joints, left more severe than right.  Thickening of bilateral first MCP joints with tenderness of the left first MCP joint.  Thickening of all MCP joints, PIPs, DIPs.  No active synovitis noted.  Knee joints have good ROM with no warmth or effusion. Ankle joints have good ROM with no tenderness or joint swelling.  CDAI Exam: CDAI Score: -- Patient Global: --; Provider Global: -- Swollen: --; Tender: -- Joint Exam 10/05/2023   No joint exam has been documented for this visit   There is currently no information documented on the homunculus. Go to the Rheumatology activity and complete the homunculus joint exam.  Investigation: No additional findings.  Imaging: No results found.  Recent Labs: Lab Results  Component Value Date   WBC 8.6 01/11/2023   HGB 11.7 01/11/2023   PLT 298 01/11/2023   NA 139 01/11/2023   K 3.8 01/11/2023   CL 98 01/11/2023   CO2 30 01/11/2023   GLUCOSE 99 01/11/2023   BUN 20 01/11/2023   CREATININE 0.75 01/11/2023   BILITOT 0.4 01/11/2023   ALKPHOS 104 03/18/2021   AST 28 01/11/2023   ALT 21 01/11/2023   PROT 7.6 01/11/2023   ALBUMIN 4.3 03/18/2021   CALCIUM 9.6 01/11/2023   GFRAA 107 05/26/2020   QFTBGOLDPLUS NEGATIVE 08/12/2022    Speciality Comments: Last Reclast 11/30/2017   Procedures:  No procedures  performed Allergies: Morphine, Morphine and codeine, Alphagan [brimonidine], Codeine, Dorzolamide hcl-timolol mal, Hydrocodone-acetaminophen, Sulfa antibiotics, and Sulfamethoxazole   Assessment / Plan:     Visit Diagnoses: Rheumatoid arthritis involving multiple sites with positive rheumatoid factor (HCC): She has no synovitis on examination today.  She continues to have chronic pain in the left thumb.  She has thickening of bilateral first CMC and MCP joints with tenderness over the left CMC and first MCP joint.  No active inflammation noted.  She has a brace but has not been wearing it frequently.  Patient plans on following up with her hand specialist to discuss an injection if her symptoms persist or worsen.  Overall her rheumatoid arthritis appears stable on Humira 40 mg sq injections every 14 days and Arava 20 mg 1 tablet by mouth daily as prescribed by Dr. Sherryll Burger.  She is tolerating combination therapy without any side effects.  She recently held Humira for 1 week while recovering from the norovirus but is otherwise not had any recent gaps in therapy.  No medication changes will be made at this time.  She will notify us if she develops any new or worsening symptoms.  She will follow up in 5 months or sooner if needed.   ILD (interstitial lung disease) (HCC) - Dr. Sherene Sires.  Previous chest x-ray 09/22/2021 stable fibrotic changes.  No acute infiltrate.  Pulmonary function testing performed on 05/20/2022. No new or worsening pulmonary symptoms.  Chest x-ray from 11/18/22 revealed chronic interstitial lung disease.  No acute findings noted.  High risk medication use - Arava 20 mg 1 tablet by mouth daily and Humira 40 mg sq every 14 days prescribed by Dr. Sherryll Burger.  CBC and CMP updated on 07/28/23. Orders for CBC and CMP released today. TB gold negative on 05/16/23.  Patient is currently on repatha--Under care of [facility]. Plan to call PCP's office for lipid panel records. Discussed the importance of holding arava and  humira if she develops signs or symptoms of an infection and to resume once the infection has completely cleared.  -  Plan: CBC with Differential/Platelet, COMPLETE METABOLIC PANEL WITH GFR  Iritis of right eye - Followed by Dr.Shah.  Reviewed Dr. Margaretmary Eddy office for that note from 07/26/2023: Quiet with no signs of active inflammation on ocular exam noted. Recommended continuing Humira and Arava as prescribed.  History of glaucoma: Primary open-angle glaucoma of both eyes-Left severe, right-moderate stage: under care of Dr. Shela Commons and Dr. Peri Jefferson Dr. Georgette Dover office visit note from 10/01/2023: Pred x 4 OD, Durezol x4 Os, timolol x1 OU AM, restasis x2 OU.  She will be following up next month.   Trochanteric bursitis, right hip: Not currently symptomatic.   DDD (degenerative disc disease), cervical - 10/21/2022 diffuse severe degeneration w/ abnormal vertebral alignmentmultilevel spinal stenosis, stable from MRI in March 2024.Congenital incomplete C1 ring. Limited ROM with lateral rotation and flexion and extension. Not planning to proceed with surgical intervention.  Under care of Dr. Yetta Barre.   Degeneration of intervertebral disc of lumbar region without discogenic back pain or lower extremity pain: No symptoms of radiculopathy. Under care of Dr. Yetta Barre   Balance problem: Unclear if it is related to declining vision, cervical spine degeneration, or a new concern.  She has been veering left while walking and feels unstable on her feet.  No recent falls.  Patient has an upcoming appointment with Dr. Lucia Gaskins for further evaluation.   Age-related osteoporosis without current pathological fracture - DEXA on 02/09/2022:spine normal, LFNTscore -3.2,RFN T score -2.8, forearm T score -3.4. Completed Forteo July 2024. Previous tx. Reclast--last infusion May 2019.   No recent falls but patient has noticed changes with her stability and balance. She is taking a calcium vitamin D supplement daily. The plan at her last  office visit on 06/07/2023 was to proceed with IV Reclast once cleared by her peridontist.  According to the patient she may require further dental work, so she will require clearance by the peridontist prior to scheduling next reclast infusion.   Other medical conditions are listed as follows:   History of seizures  History of carotid endarterectomy  History of migraine: Upcoming visit scheduled with Dr. Lucia Gaskins.   History of macular degeneration  Orders: Orders Placed This Encounter  Procedures   CBC with Differential/Platelet   COMPLETE METABOLIC PANEL WITH GFR   No orders of the defined types were placed in this encounter.   Follow-Up Instructions: Return in about 5 months (around 03/06/2024) for Rheumatoid arthritis.   Gearldine Bienenstock, PA-C  Note - This record has been created using Dragon software.  Chart creation errors have been sought, but may not always  have been located. Such creation errors do not reflect on  the standard of medical care.

## 2023-09-26 ENCOUNTER — Telehealth: Payer: Self-pay | Admitting: Cardiology

## 2023-09-26 NOTE — Telephone Encounter (Signed)
 Pt c/o medication issue:  1. Name of Medication:   REPATHA SURECLICK 140 MG/ML SOAJ    2. How are you currently taking this medication (dosage and times per day)? As prescribed   3. Are you having a reaction (difficulty breathing--STAT)? No   4. What is your medication issue? Patient is calling stating she got a letter in the mail that she is needing a letter sent in from Dr. Anne Fu for her Repatha to continue being covered. Please advise.

## 2023-09-27 ENCOUNTER — Other Ambulatory Visit (HOSPITAL_COMMUNITY): Payer: Self-pay

## 2023-09-27 ENCOUNTER — Telehealth: Payer: Self-pay | Admitting: Pharmacy Technician

## 2023-09-27 DIAGNOSIS — E785 Hyperlipidemia, unspecified: Secondary | ICD-10-CM

## 2023-09-27 DIAGNOSIS — I6522 Occlusion and stenosis of left carotid artery: Secondary | ICD-10-CM

## 2023-09-27 MED ORDER — REPATHA SURECLICK 140 MG/ML ~~LOC~~ SOAJ: 140.0000 mg | SUBCUTANEOUS | 1 refills | Status: DC

## 2023-09-27 NOTE — Telephone Encounter (Signed)
 Pharmacy Patient Advocate Encounter  Received notification from [facility] that Prior Authorization for Repatha has been APPROVED from 04/19/23 to 07/18/24. Unable to obtain price due to refill too soon rejection, last fill date 09/20/23 next available fill date03/29/25   PA #/Case ID/Reference #: [id number]

## 2023-09-27 NOTE — Addendum Note (Signed)
 Addended by: Cheree Ditto on: 09/27/2023 09:51 AM   Modules accepted: Orders

## 2023-09-27 NOTE — Telephone Encounter (Signed)
 Pharmacy Patient Advocate Encounter   Received notification from Pt Calls Messages that prior authorization for Repatha is required/requested.   Insurance verification completed.   The patient is insured through [employer] .   Per test claim: PA required; PA submitted to above mentioned insurance via CoverMyMeds Key/confirmation #/EOC [id number] Status is pending

## 2023-10-01 DIAGNOSIS — H401112 Primary open-angle glaucoma, right eye, moderate stage: Secondary | ICD-10-CM | POA: Diagnosis not present

## 2023-10-01 DIAGNOSIS — H401123 Primary open-angle glaucoma, left eye, severe stage: Secondary | ICD-10-CM | POA: Diagnosis not present

## 2023-10-05 ENCOUNTER — Ambulatory Visit: Payer: Medicare PPO | Attending: Physician Assistant | Admitting: Physician Assistant

## 2023-10-05 ENCOUNTER — Encounter: Payer: Self-pay | Admitting: Physician Assistant

## 2023-10-05 VITALS — BP 146/89 | HR 73 | Resp 13 | Ht 60.0 in | Wt 119.0 lb

## 2023-10-05 DIAGNOSIS — J849 Interstitial pulmonary disease, unspecified: Secondary | ICD-10-CM

## 2023-10-05 DIAGNOSIS — H209 Unspecified iridocyclitis: Secondary | ICD-10-CM | POA: Diagnosis not present

## 2023-10-05 DIAGNOSIS — M0579 Rheumatoid arthritis with rheumatoid factor of multiple sites without organ or systems involvement: Secondary | ICD-10-CM

## 2023-10-05 DIAGNOSIS — Z79899 Other long term (current) drug therapy: Secondary | ICD-10-CM | POA: Diagnosis not present

## 2023-10-05 DIAGNOSIS — Z8669 Personal history of other diseases of the nervous system and sense organs: Secondary | ICD-10-CM

## 2023-10-05 DIAGNOSIS — M7061 Trochanteric bursitis, right hip: Secondary | ICD-10-CM | POA: Diagnosis not present

## 2023-10-05 DIAGNOSIS — M810 Age-related osteoporosis without current pathological fracture: Secondary | ICD-10-CM | POA: Diagnosis not present

## 2023-10-05 DIAGNOSIS — Z9889 Other specified postprocedural states: Secondary | ICD-10-CM

## 2023-10-05 DIAGNOSIS — Z87898 Personal history of other specified conditions: Secondary | ICD-10-CM | POA: Diagnosis not present

## 2023-10-05 DIAGNOSIS — M5030 Other cervical disc degeneration, unspecified cervical region: Secondary | ICD-10-CM | POA: Diagnosis not present

## 2023-10-05 DIAGNOSIS — M51369 Other intervertebral disc degeneration, lumbar region without mention of lumbar back pain or lower extremity pain: Secondary | ICD-10-CM

## 2023-10-05 DIAGNOSIS — R2689 Other abnormalities of gait and mobility: Secondary | ICD-10-CM

## 2023-10-06 ENCOUNTER — Ambulatory Visit: Payer: Medicare PPO | Admitting: Neurology

## 2023-10-06 ENCOUNTER — Encounter: Payer: Self-pay | Admitting: Neurology

## 2023-10-06 VITALS — BP 138/74 | HR 81 | Ht 60.0 in | Wt 118.8 lb

## 2023-10-06 DIAGNOSIS — H547 Unspecified visual loss: Secondary | ICD-10-CM

## 2023-10-06 DIAGNOSIS — E519 Thiamine deficiency, unspecified: Secondary | ICD-10-CM

## 2023-10-06 DIAGNOSIS — R519 Headache, unspecified: Secondary | ICD-10-CM

## 2023-10-06 DIAGNOSIS — H5713 Ocular pain, bilateral: Secondary | ICD-10-CM | POA: Diagnosis not present

## 2023-10-06 DIAGNOSIS — G43711 Chronic migraine without aura, intractable, with status migrainosus: Secondary | ICD-10-CM

## 2023-10-06 DIAGNOSIS — E538 Deficiency of other specified B group vitamins: Secondary | ICD-10-CM | POA: Diagnosis not present

## 2023-10-06 DIAGNOSIS — D649 Anemia, unspecified: Secondary | ICD-10-CM | POA: Diagnosis not present

## 2023-10-06 DIAGNOSIS — R5383 Other fatigue: Secondary | ICD-10-CM | POA: Diagnosis not present

## 2023-10-06 DIAGNOSIS — H53453 Other localized visual field defect, bilateral: Secondary | ICD-10-CM

## 2023-10-06 DIAGNOSIS — R51 Headache with orthostatic component, not elsewhere classified: Secondary | ICD-10-CM

## 2023-10-06 DIAGNOSIS — E531 Pyridoxine deficiency: Secondary | ICD-10-CM

## 2023-10-06 DIAGNOSIS — G4734 Idiopathic sleep related nonobstructive alveolar hypoventilation: Secondary | ICD-10-CM | POA: Diagnosis not present

## 2023-10-06 DIAGNOSIS — R7309 Other abnormal glucose: Secondary | ICD-10-CM | POA: Diagnosis not present

## 2023-10-06 LAB — CBC WITH DIFFERENTIAL/PLATELET
Absolute Lymphocytes: 1911 {cells}/uL (ref 850–3900)
Absolute Monocytes: 1197 {cells}/uL — ABNORMAL HIGH (ref 200–950)
Basophils Absolute: 95 {cells}/uL (ref 0–200)
Basophils Relative: 0.9 %
Eosinophils Absolute: 662 {cells}/uL — ABNORMAL HIGH (ref 15–500)
Eosinophils Relative: 6.3 %
HCT: 37.3 % (ref 35.0–45.0)
Hemoglobin: 11.8 g/dL (ref 11.7–15.5)
MCH: 26.8 pg — ABNORMAL LOW (ref 27.0–33.0)
MCHC: 31.6 g/dL — ABNORMAL LOW (ref 32.0–36.0)
MCV: 84.8 fL (ref 80.0–100.0)
MPV: 11.5 fL (ref 7.5–12.5)
Monocytes Relative: 11.4 %
Neutro Abs: 6636 {cells}/uL (ref 1500–7800)
Neutrophils Relative %: 63.2 %
Platelets: 295 10*3/uL (ref 140–400)
RBC: 4.4 10*6/uL (ref 3.80–5.10)
RDW: 15.3 % — ABNORMAL HIGH (ref 11.0–15.0)
Total Lymphocyte: 18.2 %
WBC: 10.5 10*3/uL (ref 3.8–10.8)

## 2023-10-06 LAB — COMPLETE METABOLIC PANEL WITH GFR
AG Ratio: 1.7 (calc) (ref 1.0–2.5)
ALT: 22 U/L (ref 6–29)
AST: 29 U/L (ref 10–35)
Albumin: 4.5 g/dL (ref 3.6–5.1)
Alkaline phosphatase (APISO): 97 U/L (ref 37–153)
BUN: 12 mg/dL (ref 7–25)
CO2: 32 mmol/L (ref 20–32)
Calcium: 9.4 mg/dL (ref 8.6–10.4)
Chloride: 99 mmol/L (ref 98–110)
Creat: 0.68 mg/dL (ref 0.60–1.00)
Globulin: 2.7 g/dL (ref 1.9–3.7)
Glucose, Bld: 104 mg/dL — ABNORMAL HIGH (ref 65–99)
Potassium: 5.1 mmol/L (ref 3.5–5.3)
Sodium: 138 mmol/L (ref 135–146)
Total Bilirubin: 0.3 mg/dL (ref 0.2–1.2)
Total Protein: 7.2 g/dL (ref 6.1–8.1)

## 2023-10-06 NOTE — Progress Notes (Addendum)
 [facility]    Provider:  Dr Ines Referring Provider: Gerome Brunet, DO Primary Care Physician:  Gerome Brunet, DO  CC:  Chronic intractable headaches, seizures   Addendum 01/23/2024: Patient is getting > 50% improvement in migraine freq and severity on the vyepti  100mg . Increasing to 300mg  can provide further reduction in migraine frequency and severity recommend increasing.   10/06/2023: Chronic intractable headaches, vision loss, worsening headaches, she can;t even read the dials on the stove now, eye pain, morning headaches, positional headaches, vision loss bilateral, started worsening consistently well over 6-8 weeks ago, the vision worsened in January she was not getting many and now they are daily. Behind both eyes, pulsating/pounding/throbbing, light sensitivity has increased drastically, vision loss worsening. Moderate to Severe migraines, since Saturday severe and can last 8-24 hours, crawling into bed helps, dark room helps. She needs no noise and no smella, she needs he head. Start Vyepti . Botox  giver her 50% decrease in migraine severity but still having daily migraines, no medication overise, no aura, ongoing for > 6 months.  Botox  has reduced severity by 50% but patient states still having daily migraines at this time we will try for Vyepti .  Medications tried  >3 months : Ajovy , Emgality, Aimovig , Tylenol , amlodipine , Botox , Imitrex , Maxalt, Fioricet, Flexeril, Decadron , Dilantin , timolol, frovatriptan , ketorolac  injections, losartan , meloxicam, Robaxin , topiramate , amitriptyline, nortriptyline, Cymbalta, Depakote, Effexor, Reglan , Zofran , Phenergan , prednisone  tablets, Nurtec, Ubrelvy , Zoloft, gabapentin, Lyrica, propranolol, metoprolol, Ubrelvy   Reviewed MRi images with patient: fall risks discussed, she declined repeat   MRI cervical spine: FINDINGS: Alignment: There is grade 2 anterolisthesis at C2-C3. Reversal of cervical lordosis.   Vertebrae: There  is no evidence of acute fracture, discitis, or aggressive osseous lesion. There multilevel degenerative endplate changes. Bilateral facet effusions at C2-C3. Mild marrow edema within the right inferior articular process of C2 likely related to facet arthritis.   Cord: There is flattening of the spinal cord from C2-C3 through C5-C6. There is mild abnormal cord signal from C3 through C5.   Posterior Fossa, vertebral arteries, paraspinal tissues: Negative.   Disc levels: The craniocervical junction is unremarkable.   C2-C3: Grade 2 anterolisthesis, uncovertebral joint hypertrophy and bilateral facet arthropathy. There is resultant moderate spinal canal stenosis and flattening of the cord with mildly increased cord signal.There is moderate severe bilateral neural foraminal stenosis.   C3-C4: Severe disc height loss with uncovertebral hypertrophy and advanced bilateral facet arthropathy. There is moderate spinal canal stenosis and flattening of the cord with mildly increased cord signal. There is severe bilateral neural foraminal narrowing.   C4-C5: Severe disc height loss with uncovertebral joint hypertrophy and bilateral facet arthropathy. There is moderate spinal canal stenosis with flattening of the cord and mildly increased cord signal.There moderate bilateral facet arthropathy. There is moderate-severe bilateral neural foraminal stenosis.   C5-C6: Severe disc height loss with uncovertebral joint hypertrophy and bilateral facet arthropathy. Moderate spinal canal stenosis with flattening of the cord and mildly increased cord signal. There is severe right and moderate-severe left neural foraminal stenosis.   C6-C7: Bilateral uncovertebral joint hypertrophy with moderate-severe disc height loss. Bowel facet arthropathy. No significant spinal canal stenosis. There is moderate left and mild right neural foraminal stenosis.   C7-T1: Severe disc height loss with uncovertebral joint  hypertrophy, ligamentous thickening, and mild facet arthropathy. No significant spinal canal stenosis. There is mild bilateral neural foraminal stenosis.   Minimal disc bulging and ligamentous thickening at T1-T2 and T2-T3 result in no significant spinal canal stenosis. There is mild  bilateral neural foraminal narrowing at T1-T2 and moderate bilateral neural foraminal narrowing at T2-T3.   IMPRESSION: Multilevel degenerative changes throughout the cervical spine, moderate spinal canal stenosis from C2-C3 through C5-C6 resulting in flattening of the cord and mildly increased cord signal suggesting myelomalacia.   Reversal of the cervical lordosis. Grade 2 degenerative anterolisthesis at C2-C3.  Patient complains of symptoms per HPI as well as the following symptoms: eye pain . Pertinent negatives and positives per HPI. All others negative   09/15/2023: We inject the frontalis very high near the hairline and we do not inject procerus or corrugators due to difficult with muscle droop and seeing and dry eyes. SABRA > 50% decrease in migraines and headache frequency. Se has a lot of tension in her cervical paraspinals and the traps. Baseline >15 severemigraine days a month and daily headaches.140 units of Botox  was used, 60U Botox  not injected was wasted.   03/22/2023 still doing well on botox ; Migraines improved > 50% freq and severity with botox .  12/22/2022: Ask [facility] to see, she wants to come off of dilantin . As far as migraines she is stable.   09/29/2022: Migraines improved > 50% freq and severity with botox . Try to get vyepti  approved. She gets > 50% improvement with botox  but still has >8 migraine days a month and > 15 total headache days a month.   03/16/2022: stable; >70% improvement in frequency of migraines, Baseline 15 severemigraine days a month and daily headaches. No corrugators or procerus. Very high frontalis. Gave nurtec samples.   12/01/2021: stable  09/08/2021: stable  Interval  history 06/09/2021: stable. No corrugators or procerus. Very high frontalis.  Broke her patella in a wheelchair now.>70% improvement in frequency of migraines, Baseline >15 severemigraine days a month and daily headaches.  Interval history 03/10/2021: stable. No corrugators or procerus. Very high frontalis. Rest in traps. Broke her patella in a wheelchair now.  Interval history 12/01/2020: stable  Send to Dr. Chalice, intractable headches, morning headaches: Intractable headaches, wakes with headaches, has headaches when she wakes in the morning, hx of strokes: sleep evaluation  Imitrex  injections: Patient has intractable chronic migraines, the best thing that is ever worked for her is Imitrex  injections, we have tried multiple alternative medications including analgesics, Nurtec, Ubrelvy , Reyvow , and multiple other for second and third line acute management medications.  Patient had stroke, at that time we took her off her Imitrex , today she implore only request to have it back, she understands the risks, we did review the risks and discussed that sometimes life is about weighing the benefits versus those risks, at this time I will prescribe them for her, she is fully aware of the risk that she is taking with a history of vascular disease.  Interval history 02/12/2020: Migraines fluctuate with her eye problems, she sees cornea specialists and retina specialists and they may install long term steroid release long term in the eyes. Las botox  she had only 4 migraine days a month taken care of by imitrex , significant.  >70% improvement in frequency of migraines, Baseline 15 severemigraine days a month and daily headaches. No clenching.  She absolutely loved  .Dry needling: cervical myofascial pain, Send back to dry needling - [address] for cervical myofascial pain syndrome. Gave samples for Nurtec for her to try as well acutely at last visit but she did not like it.  Tried: VPA, Topamax ,  antidepressants, norvasc , phenergan , frova , aimovig , ajovy , fioricet, (save 6 As), verapamil    She is here to  discuss acute management. She cannot take imitrex  due to hx of stroke. She has the migraines twice a week which is much better that prior since being on the botox  and Ajovy .   HPI:  Brianna [name] is a 73 y.o. female here as a referral from Dr. Gerome for Patient with chronic intractable headaches. She has had good response to botox  (>50% reduction) but still technically has chronic migraines. Ajovy  has helped significantly. New problem, she had a seizure at the age of 26 because she missed her medication. 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 In college she would feel strange and have strange time awareness before the seizures. She was started in dilantin . She saw a specialist and tried other medications but Dilantinis the only one that helped. She gets regular bone density tests, last seizure was 30 years ago. Been on Dilantin  since the age of 89. She is not interested in changing.   Review of Systems: Patient complains of symptoms per HPI as well as the following symptoms: seizures, eye pain. Pertinent negatives and positives per HPI. All others negative    Social History   Socioeconomic History   Marital status: Married    Spouse name: Ryan   Number of children: 2   Years of education: Bachelor   Highest education level: Bachelor's degree (e.g., BA, AB, BS)  Occupational History   Occupation: Runner, broadcasting/film/video   Occupation: [employer] features editor: [employer]    Comment: retired  Tobacco Use   Smoking status: Former    Current packs/day: 0.00    Average packs/day: 0.5 packs/day for 14.0 years (7.0 ttl pk-yrs)    Types: Cigarettes    Start date: 48    Quit date: 1989    Years since quitting: 36.2    Passive  exposure: Never   Smokeless tobacco: Never  Vaping Use   Vaping status: Never Used  Substance and Sexual Activity   Alcohol use: No   Drug use: No   Sexual activity: Not on file  Other Topics Concern   Not on file  Social History Narrative   Patient is married to Artie), has 2 children   Patient is right handed   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   Caffeine  consumption is about 2 cups daily   Lives at home with husband    Social Drivers of Corporate investment banker Strain: Not on file  Food Insecurity: Not on file  Transportation Needs: Not on file  Physical Activity: Not on file  Stress: Not on file  Social Connections: Not on file  Intimate Partner Violence: Not on file    Family History  Problem Relation Age of Onset   Heart disease Mother    Lung disease Mother        ? disease process   Uterine cancer Mother    Heart disease Father    Clotting disorder Father    Collagen disease Father    Cervical cancer Maternal Aunt    Prostate cancer Maternal Grandfather    Rheum arthritis Sister    High Cholesterol Sister    Epilepsy Sister    Rheum arthritis Maternal Grandmother    High Cholesterol Brother    High blood pressure Brother    High blood pressure Brother    High Cholesterol Brother  High blood pressure Brother    High Cholesterol Brother     Past Medical History:  Diagnosis Date   Arthritis    in the neck   Carotid artery occlusion    Epilepsy (HCC)    GERD (gastroesophageal reflux disease)    Glaucoma    Hyperlipidemia    Hypertension    Keratoconus of both eyes 1981   Macular degeneration    Migraines    Peripheral vascular disease (HCC)    carotid occlusion surgery on left   Raynaud's disease    Retinal edema    Rheumatoid arthritis(714.0)    Stroke (HCC)    two mild strokes presumed from left carotid stenosis    Past Surgical History:  Procedure Laterality Date   CAROTID ARTERY ANGIOPLASTY  2008   Dr Dyane   CAROTID  ENDARTERECTOMY Left 11-15-2007   cataract extraction Right 04-2014   [facility]   CHOLECYSTECTOMY  1995   CORNEAL TRANSPLANT     x 5 ; steroid inj. 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   [facility]   CORNEAL TRANSPLANT Left 04/17/2019   EYE SURGERY Right 12/2016   cornea repair    EYE SURGERY Right 09/20/2017   cornea transplant    GLAUCOMA SURGERY Right 2018   ORIF PATELLA Left 02/12/2021   Procedure: OPEN REDUCTION INTERNAL (ORIF) FIXATION PATELLA;  Surgeon: Sharl Selinda Dover, MD;  Location: [facility] OR;  Service: Orthopedics;  Laterality: Left;   TRIGGER FINGER RELEASE Right 05/16/2018   Procedure: RELEASE TRIGGER FINGER/A-1 PULLEY RIGHT THUMB;  Surgeon: Murrell Kuba, MD;  Location: [facility];  Service: Orthopedics;  Laterality: Right;    Current Outpatient Medications  Medication Sig Dispense Refill   ascorbic Acid (VITAMIN C) 500 MG CPCR Take 500 mg by mouth daily.     aspirin  325 MG tablet Take 325 mg by mouth daily.     botulinum toxin Type A  (BOTOX ) 100 units SOLR injection Inject IM into the head and neck muscles every 3 months by provider in office 2 vial 3   butalbital -acetaminophen -caffeine  (FIORICET, ESGIC) 50-325-40 MG tablet Take 1 tablet by mouth every 6 (six) hours as needed for headache. 10 tablet 4   Calcium  Carbonate-Vit D-Min (CALCIUM  1200 PO) Take 2 capsules by mouth as needed.     chlorthalidone  (HYGROTON ) 25 MG tablet TAKE 1 TABLET(25 MG) BY MOUTH DAILY 90 tablet 3   Cholecalciferol (VITAMIN D  PO) Take 1,000 Units by mouth daily.      Difluprednate  0.05 % EMUL Place 1 drop into the left eye in the morning, at noon, in the evening, and at bedtime.     DILANTIN  100 MG ER capsule TAKE 3 CAPSULES BY MOUTH DAILY 90 capsule 1   Eptinezumab -jjmr (VYEPTI ) 100 MG/ML injection Inject 1 mL (100 mg total) into the vein every 3 (three) months. 1.12 mL 4   Evolocumab  (REPATHA  SURECLICK) 140 MG/ML SOAJ Inject 140 mg into the skin every 14  (fourteen) days. 6 mL 1   ezetimibe  (ZETIA ) 10 MG tablet Take 1 tablet (10 mg total) by mouth daily. 90 tablet 0   HUMIRA PEN 40 MG/0.4ML PNKT Inject 40 mg into the skin every 14 (fourteen) days. As directed     leflunomide (ARAVA) 20 MG tablet Take 20 mg by mouth daily.     LORazepam  (ATIVAN ) 0.5 MG tablet Take 0.5 mg by mouth daily as needed for anxiety or sleep.     losartan  (COZAAR ) 100 MG tablet TAKE  1 TABLET(100 MG) BY MOUTH DAILY 90 tablet 3   pantoprazole  (PROTONIX ) 40 MG tablet TAKE 1 TABLET(40 MG) BY MOUTH DAILY 90 tablet 3   promethazine  (PHENERGAN ) 25 MG tablet Take 1 tablet (25 mg total) by mouth every 6 (six) hours as needed for nausea or vomiting. 30 tablet 11   RESTASIS  0.05 % ophthalmic emulsion 2 (two) times daily.     rosuvastatin  (CRESTOR ) 20 MG tablet Take 1 tablet (20 mg total) by mouth daily. 30 tablet 10   spironolactone  (ALDACTONE ) 25 MG tablet TAKE 1/2 TABLET(12.5 MG) BY MOUTH DAILY 45 tablet 3   SUMAtriptan  (IMITREX ) 6 MG/0.5ML SOLN injection Inject 0.5 mLs (6 mg total) into the skin every 2 (two) hours as needed for migraine or headache. Take one dose at headache onset, can take additional dose 2hrs later if needed. No more then 2 injections in 24hrs (each kit contains 2 injections) 6 mL 11   tacrolimus  (PROGRAF ) 1 MG capsule Take 1 mg by mouth daily.     FORTEO  600 MCG/2.4ML SOPN INJECT UNDER THE SKIN ONCE DAILY. DISCARD PEN AFTER 28 DAYS (Patient not taking: Reported on 10/06/2023) 9.6 mL 0   Insulin  Pen Needle 31G X 5 MM MISC Use to inject Forteo  once daily. DO NOT REUSE. Forteo  received through patient assistance (Patient not taking: Reported on 06/07/2023) 100 each 2   Current Facility-Administered Medications  Medication Dose Route Frequency Provider Last Rate Last Admin   0.9 %  sodium chloride  infusion   Intravenous PRN Sebastian Lamarr SAUNDERS, PA-C        Allergies as of 10/06/2023 - Review Complete 10/06/2023  Allergen Reaction Noted   Morphine Other (See  Comments) 06/19/2015   Morphine and codeine Shortness Of Breath 01/14/2011   Alphagan [brimonidine]  12/27/2017   Codeine Nausea And Vomiting 06/19/2015   Dorzolamide hcl-timolol mal  12/27/2017   Hydrocodone-acetaminophen  Nausea And Vomiting 06/19/2015   Sulfa antibiotics Rash 01/14/2011   Sulfamethoxazole Rash 06/19/2015    Vitals: BP 138/74 (BP Location: Right Arm, Patient Position: Sitting, Cuff Size: Small)   Pulse 81   Ht 5' (1.524 m)   Wt 118 lb 12.8 oz (53.9 kg)   BMI 23.20 kg/m  Last Weight:  Wt Readings from Last 1 Encounters:  10/06/23 118 lb 12.8 oz (53.9 kg)   Last Height:   Ht Readings from Last 1 Encounters:  10/06/23 5' (1.524 m)  Exam: NAD, pleasant                  Speech:    Speech is normal; fluent and spontaneous with normal comprehension.  Cognition:    The patient is oriented to person, place, and time;     recent and remote memory intact;     language fluent;    Cranial Nerves:    The pupils are equal, round, and reactive to light.Trigeminal sensation is intact and the muscles of mastication are normal. The face is symmetric. The palate elevates in the midline. Hearing intact. Voice is normal. Shoulder shrug is normal. The tongue has normal motion without fasciculations.   Coordination:  No dysmetria  Motor Observation:    No asymmetry, no atrophy, and no involuntary movements noted. Tone:    Normal muscle tone.     Strength:    Strength is V/V in the upper and lower limbs.      Sensation: intact to LT       Assessment/Plan:  73  year old with hx of seizures.  Has been on dilantin  long term. Tried other AEDs, Discussed long-term side effects of Dilantin , she would like to stay on this medication. Will continue.   Start Vyepti  C2/C3 stenosis inoperable discussed fall risk and sequelae of worsening at this level including quadriplegia, death  Medications tried  >3 months : Ajovy , Emgality, Aimovig , Tylenol , amlodipine , Botox , Imitrex ,  Maxalt, Fioricet, Flexeril, Decadron , Dilantin , timolol, frovatriptan , ketorolac  injections, losartan , meloxicam, Robaxin , topiramate , amitriptyline, nortriptyline, Cymbalta, Depakote, Effexor, Reglan , Zofran , Phenergan , prednisone  tablets, Nurtec, Ubrelvy , Zoloft, gabapentin, Lyrica, propranolol, metoprolol, Ubrelvy   Vision loss, worsening headaches: MRI brain and orbits, labs  Orders Placed This Encounter  Procedures   MR BRAIN W WO CONTRAST   MR ORBITS W WO CONTRAST   TSH Rfx on Abnormal to Free T4   Iron, TIBC and Ferritin Panel   B12 and Folate Panel   Hemoglobin A1c   Vitamin B6   Vitamin A    Vitamin B1   Methylmalonic acid, serum   Overnight Pulse Oximetry Study   Meds ordered this encounter  Medications   Eptinezumab -jjmr (VYEPTI ) 100 MG/ML injection    Sig: Inject 1 mL (100 mg total) into the vein every 3 (three) months.    Dispense:  1.12 mL    Refill:  4      Triptans contraindicated in this patient due to strokes. Will try several other options gave samples. Discussed in detail: Ubrelvy : taking with dilantin  may increase ubrelvy  action, but will keep her at 50mg  instead of 100mg  and only as needed no more than 2 days a week Lasmiditan  and Dilantin  : May increase sedation, discussed with patient, again only as needed up to 2 days a week Nurtec: Once daily as needed up to 2 days a week Discussed Nerivio - seizure precautions - Discussed Patients with epilepsy have a small risk of sudden unexpected death, a condition referred to as sudden unexpected death in epilepsy (SUDEP). SUDEP is defined specifically as the sudden, unexpected, witnessed or unwitnessed, nontraumatic and nondrowning death in patients with epilepsy with or without evidence for a seizure, and excluding documented status epilepticus, in which post mortem examination does not reveal a structural or toxicologic cause for death  - Patient is unable to drive, operate heavy machinery, perform activities at  heights or participate in water activities until 6 months seizure free. She is well controlled without a seizure for many years.   Meds ordered this encounter  Medications   Eptinezumab -jjmr (VYEPTI ) 100 MG/ML injection    Sig: Inject 1 mL (100 mg total) into the vein every 3 (three) months.    Dispense:  1.12 mL    Refill:  4     Onetha Epp, MD  [facility] [address] [place], [place] [address]  Phone [phone] Fax [phone]  I spent over 60  minutes of face-to-face and non-face-to-face time with patient on the  1. Chronic migraine without aura, with intractable migraine, so stated, with status migrainosus   2. Worsening headaches   3. Morning headache   4. Positional headache   5. Vision loss   6. Peripheral vision loss, bilateral   7. Pain of both eyes   8. Nocturnal hypoxemia   9. Other fatigue   10. Elevated glucose   11. screen for Vitamin B6 deficiency   12. screen for Vitamin B12 deficiency   13. screen for Vitamin B1 deficiency   14. Anemia, unspecified type     diagnosis.  This included previsit chart review, lab review, study  review, order entry, electronic health record documentation, patient education on the different diagnostic and therapeutic options, counseling and coordination of care, risks and benefits of management, compliance, or risk factor reduction

## 2023-10-06 NOTE — Patient Instructions (Addendum)
 Blood work Start vyepti Continue ajovy monthly until [employer] brain and orbits Oxygen monitor overnight At next botox put some in the masseters Physical therapy balance:   Eptinezumab Injection What is this medication? EPTINEZUMAB (EP ti NEZ ue mab) prevents migraines. It works by blocking a substance in the body that causes migraines. It is a monoclonal antibody. This medicine may be used for other purposes; ask your health care provider or pharmacist if you have questions. COMMON BRAND NAME(S): Vyepti What should I tell my care team before I take this medication? They need to know if you have any of these conditions: An unusual or allergic reaction to eptinezumab, other medications, foods, dyes, or preservatives Pregnant or trying to get pregnant Breast-feeding How should I use this medication? This medication is injected into a vein. It is given by your care team in a hospital or clinic setting. Talk to your care team about the use of this medication in children. Special care may be needed. Overdosage: If you think you have taken too much of this medicine contact a poison control center or emergency room at once. NOTE: This medicine is only for you. Do not share this medicine with others. What if I miss a dose? Keep appointments for follow-up doses. It is important not to miss your dose. Call your care team if you are unable to keep an appointment. What may interact with this medication? Interactions are not expected. This list may not describe all possible interactions. Give your health care provider a list of all the medicines, herbs, non-prescription drugs, or dietary supplements you use. Also tell them if you smoke, drink alcohol, or use illegal drugs. Some items may interact with your medicine. What should I watch for while using this medication? Your condition will be monitored carefully while you are receiving this medication. What side effects may I notice from receiving  this medication? Side effects that you should report to your care team as soon as possible: Allergic reactions or angioedema--skin rash, itching or hives, swelling of the face, eyes, lips, tongue, arms, or legs, trouble swallowing or breathing Side effects that usually do not require medical attention (report to your care team if they continue or are bothersome): Runny or stuffy nose This list may not describe all possible side effects. Call your doctor for medical advice about side effects. You may report side effects to FDA at 1-800-FDA-1088. Where should I keep my medication? This medication is given in a hospital or clinic. It will not be stored at home. NOTE: This sheet is a summary. It may not cover all possible information. If you have questions about this medicine, talk to your doctor, pharmacist, or health care provider.  2024 Elsevier/Gold Standard (2021-08-31 00:00:00)

## 2023-10-06 NOTE — Progress Notes (Signed)
 CMP WNL  MCH and MCHC low.  Absolute monocytes and eosinophils borderline elevated.

## 2023-10-07 ENCOUNTER — Telehealth: Payer: Self-pay | Admitting: *Deleted

## 2023-10-07 DIAGNOSIS — G4734 Idiopathic sleep related nonobstructive alveolar hypoventilation: Secondary | ICD-10-CM

## 2023-10-07 DIAGNOSIS — R5383 Other fatigue: Secondary | ICD-10-CM

## 2023-10-07 DIAGNOSIS — R519 Headache, unspecified: Secondary | ICD-10-CM

## 2023-10-07 LAB — TSH RFX ON ABNORMAL TO FREE T4: TSH: 0.897 u[IU]/mL (ref 0.450–4.500)

## 2023-10-07 NOTE — Telephone Encounter (Signed)
 Sharyne Richters, RN; Tomasa Rand; 1 other Received, Thank you     Previous Messages    ----- Message ----- From: Guy Begin, RN Sent: 10/07/2023  10:09 AM EDT To: Kathyrn Sheriff; Santina Evans; Kathe Becton; * Subject: ONO on RA                                      Good Morning.  New order in Epic for this pt  Brianna [name]" Female, 73 y.o., 1951/04/22 MRN: [id number] Phone: [phone]   ONO on RA x 1 for nocturnal hypoxemia  Thanks  [place]

## 2023-10-07 NOTE — Telephone Encounter (Signed)
 Sent community message about ONO on RA x1 nocturnal hypoxemia to [facility].

## 2023-10-08 ENCOUNTER — Telehealth: Payer: Self-pay | Admitting: Neurology

## 2023-10-08 NOTE — Telephone Encounter (Signed)
 MRI brain [facility] auth: [id number] exp. 10/08/23-12/07/23  MRI orbits [employer] auth: [id number] exp. 10/08/23-12/07/23 sent to GI [phone]

## 2023-10-09 MED ORDER — VYEPTI 100 MG/ML IV SOLN: 100.0000 mg | INTRAVENOUS | 4 refills | Status: AC

## 2023-10-09 NOTE — Addendum Note (Signed)
 Addended by: Naomie Dean B on: 10/09/2023 03:35 PM   Modules accepted: Level of Service

## 2023-10-10 ENCOUNTER — Encounter: Payer: Self-pay | Admitting: Neurology

## 2023-10-10 ENCOUNTER — Telehealth: Payer: Self-pay | Admitting: *Deleted

## 2023-10-10 ENCOUNTER — Other Ambulatory Visit: Payer: Self-pay | Admitting: Cardiology

## 2023-10-10 DIAGNOSIS — H401123 Primary open-angle glaucoma, left eye, severe stage: Secondary | ICD-10-CM | POA: Diagnosis not present

## 2023-10-10 DIAGNOSIS — Z79899 Other long term (current) drug therapy: Secondary | ICD-10-CM | POA: Diagnosis not present

## 2023-10-10 DIAGNOSIS — H353221 Exudative age-related macular degeneration, left eye, with active choroidal neovascularization: Secondary | ICD-10-CM | POA: Diagnosis not present

## 2023-10-10 DIAGNOSIS — Z961 Presence of intraocular lens: Secondary | ICD-10-CM | POA: Diagnosis not present

## 2023-10-10 DIAGNOSIS — Z947 Corneal transplant status: Secondary | ICD-10-CM | POA: Diagnosis not present

## 2023-10-10 DIAGNOSIS — H30033 Focal chorioretinal inflammation, peripheral, bilateral: Secondary | ICD-10-CM | POA: Diagnosis not present

## 2023-10-10 DIAGNOSIS — H30102 Unspecified disseminated chorioretinal inflammation, left eye: Secondary | ICD-10-CM | POA: Diagnosis not present

## 2023-10-10 DIAGNOSIS — H401112 Primary open-angle glaucoma, right eye, moderate stage: Secondary | ICD-10-CM | POA: Diagnosis not present

## 2023-10-10 DIAGNOSIS — H3581 Retinal edema: Secondary | ICD-10-CM | POA: Diagnosis not present

## 2023-10-10 NOTE — Telephone Encounter (Signed)
-----   Message from Anson Fret sent at 10/09/2023  3:21 PM EDT ----- Regarding: Start vyepti protocol please g43.711 Start vyepti protocol please g43.711  Medications tried  >3 months : Ajovy, Emgality, Aimovig, Tylenol, amlodipine, Botox, Imitrex, Maxalt, Fioricet, Flexeril, Decadron, Dilantin, timolol, frovatriptan, ketorolac injections, losartan, meloxicam, Robaxin, topiramate, amitriptyline, nortriptyline, Cymbalta, Depakote, Effexor, Reglan, Zofran, Phenergan, prednisone tablets, Nurtec, Ubrelvy, Zoloft, gabapentin, Lyrica, propranolol, metoprolol, Bernita Raisin

## 2023-10-10 NOTE — Telephone Encounter (Signed)
 Vyepti 100 mg IV q3 month order form completed and is pending Dr Trevor Mace signature. Dx code Z61.096.

## 2023-10-11 LAB — VITAMIN B6: Vitamin B6: 21.5 ug/L (ref 3.4–65.2)

## 2023-10-11 LAB — VITAMIN A: Vitamin A: 60 ug/dL (ref 22.0–69.5)

## 2023-10-11 LAB — HEMOGLOBIN A1C
Est. average glucose Bld gHb Est-mCnc: 117 mg/dL
Hgb A1c MFr Bld: 5.7 % — ABNORMAL HIGH (ref 4.8–5.6)

## 2023-10-11 LAB — B12 AND FOLATE PANEL
Folate: 5.6 ng/mL (ref >3.0)
Vitamin B-12: 399 pg/mL (ref 232–1245)

## 2023-10-11 LAB — IRON,TIBC AND FERRITIN PANEL
Ferritin: 18 ng/mL (ref 15–150)
Iron Saturation: 6 % — CL (ref 15–55)
Iron: 20 ug/dL — ABNORMAL LOW (ref 27–139)
Total Iron Binding Capacity: 360 ug/dL (ref 250–450)
UIBC: 340 ug/dL (ref 118–369)

## 2023-10-11 LAB — METHYLMALONIC ACID, SERUM: Methylmalonic Acid: 332 nmol/L (ref 0–378)

## 2023-10-11 LAB — VITAMIN B1: Thiamine: 182.5 nmol/L (ref 66.5–200.0)

## 2023-10-11 NOTE — Telephone Encounter (Signed)
 Order signed and given to [facility].

## 2023-10-12 DIAGNOSIS — G4730 Sleep apnea, unspecified: Secondary | ICD-10-CM | POA: Diagnosis not present

## 2023-10-12 DIAGNOSIS — R0902 Hypoxemia: Secondary | ICD-10-CM | POA: Diagnosis not present

## 2023-10-17 ENCOUNTER — Encounter: Payer: Self-pay | Admitting: Neurology

## 2023-10-17 NOTE — Telephone Encounter (Signed)
 ONO results received via fax from [facility]. Results given to Dr Lucia Gaskins to review. Sent copy to medical records for scanning.

## 2023-10-19 ENCOUNTER — Ambulatory Visit: Payer: Medicare PPO | Admitting: Neurology

## 2023-10-19 ENCOUNTER — Encounter: Payer: Self-pay | Admitting: Neurology

## 2023-10-19 VITALS — BP 194/77 | HR 127 | Resp 15 | Ht 60.0 in | Wt 114.5 lb

## 2023-10-19 DIAGNOSIS — G40209 Localization-related (focal) (partial) symptomatic epilepsy and epileptic syndromes with complex partial seizures, not intractable, without status epilepticus: Secondary | ICD-10-CM

## 2023-10-19 MED ORDER — LACOSAMIDE 100 MG PO TABS: 100.0000 mg | ORAL_TABLET | Freq: Two times a day (BID) | ORAL | 5 refills | Status: DC

## 2023-10-19 NOTE — Patient Instructions (Addendum)
 Will start Lacosamide 50 mg twice daily for 1 week then increase to 100 mg twice daily  Decrease Dilantin to 200 mg daily for one month Starting May 1, please decrease Dilantin to 100 mg for another month then stop Dilantin June 1st.   Return in 2 to 3 months for lab works

## 2023-10-19 NOTE — Progress Notes (Signed)
 [facility]  [name] DOB: 09-24-1950  REQUESTING CLINICIAN: Irena Reichmann, DO HISTORY FROM: Patient  REASON FOR VISIT: Antiseizure medication management    HISTORICAL  CHIEF COMPLAINT:  Chief Complaint  Patient presents with   Migraine    [name], alone, Migraine:sees ahern for migraines. Stated that she is here today because dilantin causes gums to grow over teeth and she's had t have the gums cut back. Last sz she had was when she was in her 43s.     HISTORY OF PRESENT ILLNESS:  This is a 73 year old woman past medical history of epilepsy, since the age of 61, migraine headaches, hypertension, hyperlipidemia who is presenting for antiseizure medication management.  In terms of the epilepsy, she was diagnosed at the age of 89, and her last seizure was at the age of 14.  She is currently on Dilantin 300 mg daily but has tried topiramate, Depakote and phenobarbital in the past.  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  She is interested in switching Dilantin to a newer antiseizure medication.  Again her last seizure was more than 35 years ago.   OTHER MEDICAL CONDITIONS: Epilepsy, Migraines, Hypertension, Hyperlipidemia    REVIEW OF SYSTEMS: Full 14 system review of systems performed and negative with exception of: As noted in the HPI   ALLERGIES: Allergies  Allergen Reactions   Morphine Other (See Comments)    Difficulty breathing   Morphine And Codeine Shortness Of Breath   Alphagan [Brimonidine]     Eyelid swelling, scratching Allergic to preservative contained in this med   Codeine Nausea And Vomiting   Dorzolamide Hcl-Timolol Mal     Inflammation of the eyelid, scratching Allergic to preservative contained in this med   Hydrocodone-Acetaminophen Nausea And Vomiting    But tolerates tylenol   Sulfa Antibiotics Rash    "Large bumps"   Sulfamethoxazole Rash    HOME  MEDICATIONS: Outpatient Medications Prior to Visit  Medication Sig Dispense Refill   ascorbic Acid (VITAMIN C) 500 MG CPCR Take 500 mg by mouth daily.     aspirin 325 MG tablet Take 325 mg by mouth daily.     botulinum toxin Type A (BOTOX) 100 units SOLR injection Inject IM into the head and neck muscles every 3 months by provider in office 2 vial 3   butalbital-acetaminophen-caffeine (FIORICET, ESGIC) 50-325-40 MG tablet Take 1 tablet by mouth every 6 (six) hours as needed for headache. 10 tablet 4   Calcium Carbonate-Vit D-Min (CALCIUM 1200 PO) Take 2 capsules by mouth as needed.     chlorthalidone (HYGROTON) 25 MG tablet TAKE 1 TABLET(25 MG) BY MOUTH DAILY 90 tablet 3   Cholecalciferol (VITAMIN D PO) Take 1,000 Units by mouth daily.      Difluprednate 0.05 % EMUL Place 1 drop into the left eye in the morning, at noon, in the evening, and at bedtime.     DILANTIN 100 MG ER capsule TAKE 3 CAPSULES BY MOUTH DAILY 90 capsule 1   Eptinezumab-jjmr (VYEPTI) 100 MG/ML injection Inject 1 mL (100 mg total) into the vein every 3 (three) months. 1.12 mL 4   Evolocumab (REPATHA SURECLICK) 140 MG/ML SOAJ Inject 140 mg into the skin every 14 (fourteen) days. 6 mL 1   ezetimibe (ZETIA) 10 MG tablet Take 1 tablet (10 mg total) by mouth daily. 90 tablet 0   FORTEO 600 MCG/2.4ML SOPN INJECT UNDER THE SKIN ONCE  DAILY. DISCARD PEN AFTER 28 DAYS 9.6 mL 0   HUMIRA PEN 40 MG/0.4ML PNKT Inject 40 mg into the skin every 14 (fourteen) days. As directed     Insulin Pen Needle 31G X 5 MM MISC Use to inject Forteo once daily. DO NOT REUSE. Forteo received through patient assistance 100 each 2   leflunomide (ARAVA) 20 MG tablet Take 20 mg by mouth daily.     LORazepam (ATIVAN) 0.5 MG tablet Take 0.5 mg by mouth daily as needed for anxiety or sleep.     losartan (COZAAR) 100 MG tablet TAKE 1 TABLET(100 MG) BY MOUTH DAILY 90 tablet 3   pantoprazole (PROTONIX) 40 MG tablet TAKE 1 TABLET(40 MG) BY MOUTH DAILY 90 tablet 3    promethazine (PHENERGAN) 25 MG tablet Take 1 tablet (25 mg total) by mouth every 6 (six) hours as needed for nausea or vomiting. 30 tablet 11   RESTASIS 0.05 % ophthalmic emulsion 2 (two) times daily.     rosuvastatin (CRESTOR) 20 MG tablet Take 1 tablet (20 mg total) by mouth daily. 30 tablet 10   spironolactone (ALDACTONE) 25 MG tablet TAKE 1/2 TABLET(12.5 MG) BY MOUTH DAILY 45 tablet 3   SUMAtriptan (IMITREX) 6 MG/0.5ML SOLN injection Inject 0.5 mLs (6 mg total) into the skin every 2 (two) hours as needed for migraine or headache. Take one dose at headache onset, can take additional dose 2hrs later if needed. No more then 2 injections in 24hrs (each kit contains 2 injections) 6 mL 11   tacrolimus (PROGRAF) 1 MG capsule Take 1 mg by mouth daily.     Facility-Administered Medications Prior to Visit  Medication Dose Route Frequency Provider Last Rate Last Admin   0.9 %  sodium chloride infusion   Intravenous PRN Janetta Hora, PA-C        PAST MEDICAL HISTORY: Past Medical History:  Diagnosis Date   Arthritis    in the neck   Carotid artery occlusion    Epilepsy (HCC)    GERD (gastroesophageal reflux disease)    Glaucoma    Hyperlipidemia    Hypertension    Keratoconus of both eyes 1981   Macular degeneration    Migraines    Peripheral vascular disease (HCC)    carotid occlusion surgery on left   Raynaud's disease    Retinal edema    Rheumatoid arthritis(714.0)    Stroke (HCC)    two mild strokes presumed from left carotid stenosis    PAST SURGICAL HISTORY: Past Surgical History:  Procedure Laterality Date   CAROTID ARTERY ANGIOPLASTY  2008   Dr Madilyn Fireman   CAROTID ENDARTERECTOMY Left 11-15-2007   cataract extraction Right 04-2014   [facility]   CHOLECYSTECTOMY  1995   CORNEAL TRANSPLANT     x 5 ; steroid inj. 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   EYE SURGERY Right 12/2016   cornea repair     EYE SURGERY Right 09/20/2017   cornea transplant    GLAUCOMA SURGERY Right 2018   ORIF PATELLA Left 02/12/2021   Procedure: OPEN REDUCTION INTERNAL (ORIF) FIXATION PATELLA;  Surgeon: Yolonda Kida, MD;  Location: [facility] OR;  Service: Orthopedics;  Laterality: Left;   TRIGGER FINGER RELEASE Right 05/16/2018   Procedure: RELEASE TRIGGER FINGER/A-1 PULLEY RIGHT THUMB;  Surgeon: Cindee Salt, MD;  Location: [facility];  Service: Orthopedics;  Laterality: Right;    FAMILY HISTORY: Family History  Problem Relation Age of Onset   Heart disease Mother    Lung disease Mother        ? disease process   Uterine cancer Mother    Heart disease Father    Clotting disorder Father    Collagen disease Father    Cervical cancer Maternal Aunt    Prostate cancer Maternal Grandfather    Rheum arthritis Sister    High Cholesterol Sister    Epilepsy Sister    Rheum arthritis Maternal Grandmother    High Cholesterol Brother    High blood pressure Brother    High blood pressure Brother    High Cholesterol Brother    High blood pressure Brother    High Cholesterol Brother     SOCIAL HISTORY: Social History   Socioeconomic History   Marital status: Married    Spouse name: [name]   Number of children: 2   Years of education: Bachelor   Highest education level: Bachelor's degree (e.g., BA, AB, BS)  Occupational History   Occupation: Runner, broadcasting/film/video   Occupation: AG TEACHER    Employer: [employer]    Comment: retired  Tobacco Use   Smoking status: Former    Current packs/day: 0.00    Average packs/day: 0.5 packs/day for 14.0 years (7.0 ttl pk-yrs)    Types: Cigarettes    Start date: 9    Quit date: 1989    Years since quitting: 36.2    Passive exposure: Never   Smokeless tobacco: Never  Vaping Use   Vaping status: Never Used  Substance and Sexual Activity   Alcohol use: No   Drug use: No   Sexual activity: Not on file  Other Topics Concern   Not on file   Social History Narrative   Patient is married to [name]), has 2 children   Patient is right handed   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   Caffeine consumption is about 2 cups daily   Lives at home with husband    Social Drivers of Corporate investment banker Strain: Not on file  Food Insecurity: Not on file  Transportation Needs: Not on file  Physical Activity: Not on file  Stress: Not on file  Social Connections: Not on file  Intimate Partner Violence: Not on file    PHYSICAL EXAM  GENERAL EXAM/CONSTITUTIONAL: Vitals:  Vitals:   10/19/23 0927 10/19/23 0931  BP: (!) 179/106 (!) 194/77  Pulse:  (!) 127  Resp:  15  Weight:  114 lb 8 oz (51.9 kg)  Height:  5' (1.524 m)   Body mass index is 22.36 kg/m. Wt Readings from Last 3 Encounters:  10/19/23 114 lb 8 oz (51.9 kg)  10/06/23 118 lb 12.8 oz (53.9 kg)  10/05/23 119 lb (54 kg)   Patient is in no distress; well developed, nourished and groomed; neck is supple  MUSCULOSKELETAL: Gait, strength, tone, movements noted in Neurologic exam below  NEUROLOGIC: MENTAL STATUS:      No data to display         awake, alert, oriented to person, place and time recent and remote memory intact normal attention and concentration language fluent, comprehension intact, naming intact fund of knowledge appropriate  CRANIAL NERVE:  2nd, 3rd, 4th, 6th - Visual fields full to confrontation, extraocular muscles intact, no nystagmus 5th - facial sensation symmetric 7th - facial strength symmetric 8th - hearing intact 9th - palate elevates symmetrically, uvula midline 11th - shoulder shrug symmetric 12th - tongue protrusion midline  MOTOR:  normal bulk and tone, full strength in the BUE, BLE  SENSORY:  normal and symmetric to light touch  COORDINATION:  finger-nose-finger, fine finger movements normal  GAIT/STATION:  normal     DIAGNOSTIC DATA (LABS, IMAGING, TESTING) - I reviewed patient records, labs, notes,  testing and imaging myself where available.  Lab Results  Component Value Date   WBC 10.5 10/05/2023   HGB 11.8 10/05/2023   HCT 37.3 10/05/2023   MCV 84.8 10/05/2023   PLT 295 10/05/2023      Component Value Date/Time   NA 138 10/05/2023 1023   NA 140 03/18/2021 0914   K 5.1 10/05/2023 1023   CL 99 10/05/2023 1023   CO2 32 10/05/2023 1023   GLUCOSE 104 (H) 10/05/2023 1023   BUN 12 10/05/2023 1023   BUN 11 03/18/2021 0914   CREATININE 0.68 10/05/2023 1023   CALCIUM 9.4 10/05/2023 1023   PROT 7.2 10/05/2023 1023   PROT 7.0 03/18/2021 0914   ALBUMIN 4.3 03/18/2021 0914   AST 29 10/05/2023 1023   ALT 22 10/05/2023 1023   ALKPHOS 104 03/18/2021 0914   BILITOT 0.3 10/05/2023 1023   BILITOT 0.2 03/18/2021 0914   GFRNONAA >60 02/15/2021 0448   GFRNONAA 95 12/13/2018 1352   GFRAA 107 05/26/2020 1607   GFRAA 110 12/13/2018 1352   Lab Results  Component Value Date   CHOL 140 03/18/2021   HDL 71 03/18/2021   LDLCALC 49 03/18/2021   TRIG 113 03/18/2021   CHOLHDL 2.0 03/18/2021   Lab Results  Component Value Date   HGBA1C 5.7 (H) 10/06/2023   Lab Results  Component Value Date   VITAMINB12 399 10/06/2023   Lab Results  Component Value Date   TSH 0.897 10/06/2023      ASSESSMENT AND PLAN  73 y.o. year old female with longstanding history of epilepsy, last seizure more than 35 years ago on Dilantin who is presenting for management of her antiseizure medication.  With the Dilantin she does have side effect of gingival hyperplasia.  Plan will be to switch the patient to another sodium channel blocker, lacosamide.  We will start lacosamide 50 mg twice daily for 1 week then increase to 100 mg twice daily.  I have advised patient to decrease the Dilantin to 200 mg daily for 1 month then starting May 1 to further decrease to 100 mg daily and stop the medication on June 1.  I will see her in mid June for lab work.  She voiced understanding.  Advised patient to contact me if she  does have any adverse reaction, or seizure activity like activity.   1. Partial symptomatic epilepsy with complex partial seizures, not intractable, without status epilepticus [facility])      Patient Instructions  Will start Lacosamide 50 mg twice daily for 1 week then increase to 100 mg twice daily  Decrease Dilantin to 200 mg daily for one month Starting May 1, please decrease Dilantin to 100 mg for another month then stop Dilantin June 1st.   Return in 2 to 3 months for lab works   No orders of the defined types were placed in this encounter.   Meds ordered this encounter  Medications   Lacosamide 100 MG TABS    Sig: Take 1 tablet (100 mg total) by mouth in the morning and at bedtime.    Dispense:  60 tablet    Refill:  5    Return in about 12 weeks (around 01/11/2024).  Windell Norfolk, MD 10/19/2023, 11:05 AM  [facility] [address], [address] [place], [place] [address] [phone]

## 2023-10-20 ENCOUNTER — Telehealth: Payer: Self-pay | Admitting: *Deleted

## 2023-10-20 NOTE — Telephone Encounter (Signed)
 Labs received from: Dr. Salvadore Dom  Drawn on: 08/17/2023  Reviewed by:Dr. Pollyann Savoy  Labs drawn:CBC, CMP, UA, Lipid Panel, Hgb A1C, TSH  Results: Eos (Absolute) 0.5     WBC Esterase 1+    WBC 6-10    RBC 3-10    Casts Present    Hgb A1C 5.7  Patient is on Arava 20 mg po daily and Humira 40 mg SQ every 14 days.

## 2023-10-21 ENCOUNTER — Ambulatory Visit: Payer: Medicare PPO

## 2023-10-21 ENCOUNTER — Encounter (HOSPITAL_COMMUNITY): Payer: Medicare PPO

## 2023-10-24 ENCOUNTER — Telehealth: Payer: Self-pay | Admitting: *Deleted

## 2023-10-24 DIAGNOSIS — H353221 Exudative age-related macular degeneration, left eye, with active choroidal neovascularization: Secondary | ICD-10-CM | POA: Diagnosis not present

## 2023-10-24 DIAGNOSIS — H30102 Unspecified disseminated chorioretinal inflammation, left eye: Secondary | ICD-10-CM | POA: Diagnosis not present

## 2023-10-24 DIAGNOSIS — H3581 Retinal edema: Secondary | ICD-10-CM | POA: Diagnosis not present

## 2023-10-24 DIAGNOSIS — Z79899 Other long term (current) drug therapy: Secondary | ICD-10-CM | POA: Diagnosis not present

## 2023-10-24 DIAGNOSIS — H401112 Primary open-angle glaucoma, right eye, moderate stage: Secondary | ICD-10-CM | POA: Diagnosis not present

## 2023-10-24 DIAGNOSIS — H30033 Focal chorioretinal inflammation, peripheral, bilateral: Secondary | ICD-10-CM | POA: Diagnosis not present

## 2023-10-24 DIAGNOSIS — H401123 Primary open-angle glaucoma, left eye, severe stage: Secondary | ICD-10-CM | POA: Diagnosis not present

## 2023-10-24 DIAGNOSIS — Z947 Corneal transplant status: Secondary | ICD-10-CM | POA: Diagnosis not present

## 2023-10-24 DIAGNOSIS — Z961 Presence of intraocular lens: Secondary | ICD-10-CM | POA: Diagnosis not present

## 2023-10-24 NOTE — Telephone Encounter (Signed)
 Patient contacted the office stating she sees Dr. Sherryll Burger. She states that Dr. Sherryll Burger was previously prescribing Arava for at least the past year. Patient states that Dr. Sherryll Burger wants Dr. Corliss Skains to take it over. Patient states she needs a new prescription and only has a week on  medication left. Please advise.

## 2023-10-24 NOTE — Telephone Encounter (Signed)
 Ok to send refill of arava

## 2023-10-25 ENCOUNTER — Telehealth: Payer: Self-pay

## 2023-10-25 ENCOUNTER — Telehealth: Payer: Self-pay | Admitting: Neurology

## 2023-10-25 MED ORDER — PHENYTOIN SODIUM EXTENDED 100 MG PO CAPS: 200.0000 mg | ORAL_CAPSULE | Freq: Every day | ORAL | 1 refills | Status: DC

## 2023-10-25 MED ORDER — PHENYTOIN SODIUM EXTENDED 100 MG PO CAPS: 300.0000 mg | ORAL_CAPSULE | Freq: Every day | ORAL | 1 refills | Status: DC

## 2023-10-25 NOTE — Telephone Encounter (Signed)
 Call from [facility] pharmacist at [facility], he states patient had been out of dilantin for 2 days. Her script is for brand name but he reports no one on the area has that and there is talk of discontinuing, as it is harder to find brand name Dilantin. Discussed with [facility] and verbal order to change to generic form. Script sent to [facility]

## 2023-10-25 NOTE — Telephone Encounter (Signed)
 Spoke to patient and advised we would be able to send in the [place] prescription. While verifying the pharmacy, it appears the prescription has already been sent in by Dr. Sherryll Burger on 10/16/2023 for a 90 day supply with refills. Patient advised they may need to do a prior authorization and she would need to reach out to their office to advise.

## 2023-10-25 NOTE — Addendum Note (Signed)
 Addended by: Henriette Combs on: 10/25/2023 10:12 AM   Modules accepted: Orders

## 2023-10-25 NOTE — Telephone Encounter (Signed)
 Pt said pharmacy no longer have Dilantin only have the generic. Request prescription for generic for Dilantin. Pharmacy said wiill take couple of days to get the generic.What do I need to do, do I need 200 mg of the new medication (did not know the name of the new medication). Would like a call from the nurse.

## 2023-10-25 NOTE — Telephone Encounter (Signed)
 Call to patient, advised new script was sent. We reviewed last note and corrected instruction and resent to pharmacy, Call to [facility] and [facility] and verified original script was discontinued.My char message sen to patient with tapering instructions.

## 2023-10-26 ENCOUNTER — Telehealth: Payer: Self-pay | Admitting: Cardiology

## 2023-10-26 DIAGNOSIS — Z79899 Other long term (current) drug therapy: Secondary | ICD-10-CM

## 2023-10-26 DIAGNOSIS — I10 Essential (primary) hypertension: Secondary | ICD-10-CM

## 2023-10-26 NOTE — Telephone Encounter (Signed)
 Spoke with pt regarding Repatha as well as her blood pressure. Pt was notified that the prior auth for Repatha was approved on 3/11. When asked about her blood pressure the pt stated she has had some high readings. On 3/20 it was 150/80. On 4/3 at a neuro appointment it was 160/110. Pt had no other blood pressures to report. Pt was asked to take her blood pressure while on the phone. Pt's BP was 141/87 and HR was 86. Pt stated she has been stressed out lately and wonders if that could be affecting her BP. Pt also suffers from migraines. Pt stated she has been experiencing headaches "left and right" as well dizziness and nausea. Pt was asked if she typically has these symptoms with her migraines. Pt stated she does experience nausea with her migraines at times. Pt was told to keep a log of her blood pressures, taking it 1 hour after her blood pressure medication. Pt told that the information provided would be forwarded to Dr. Anne Fu and his nurse. Pt was told to report to the ED should she have any blurred vision, facial drooping, one sided weakness or severe headache. Pt verbalized understanding. All questions, if any, were answered.

## 2023-10-26 NOTE — Telephone Encounter (Signed)
 Pt c/o BP issue: STAT if pt c/o blurred vision, one-sided weakness or slurred speech  1. What are your last 5 BP readings? 150/80 around 10/06/23 at her appt to get botox, 160/110 on 10/20/23 at her neuro appt while discussing her anti seizure medication   2. Are you having any other symptoms (ex. Dizziness, headache, blurred vision, passed out)? No  3. What is your BP issue? Pt is requesting a callback from nurse regarding her BP being higher lately.    Pt c/o medication issue:  1. Name of Medication:   Evolocumab (REPATHA SURECLICK) 140 MG/ML SOAJ    2. How are you currently taking this medication (dosage and times per day)?  Inject 140 mg into the skin every 14 (fourteen) days.     3. Are you having a reaction (difficulty breathing--STAT)? No  4. What is your medication issue? Pt stated she's unable to get her medication until a PA is done per the pharmacy and her insurance. Please advise

## 2023-10-28 NOTE — Telephone Encounter (Signed)
 Called pt to discuss Dr Anne Fu orders to start Amlodipine 2.5 mg daily.  Pt reports she is unable to take Amlodipine because it causes her gums to grow up on her teeth and she has had to have them cut back twice already.  She would prefer a different medication.  Listed in allergies.  Will forward back to Dr Anne Fu for further recommendations.

## 2023-10-31 DIAGNOSIS — M542 Cervicalgia: Secondary | ICD-10-CM | POA: Diagnosis not present

## 2023-10-31 DIAGNOSIS — R2681 Unsteadiness on feet: Secondary | ICD-10-CM | POA: Diagnosis not present

## 2023-10-31 DIAGNOSIS — H81399 Other peripheral vertigo, unspecified ear: Secondary | ICD-10-CM | POA: Diagnosis not present

## 2023-10-31 DIAGNOSIS — M25512 Pain in left shoulder: Secondary | ICD-10-CM | POA: Diagnosis not present

## 2023-11-02 ENCOUNTER — Encounter: Payer: Self-pay | Admitting: Neurology

## 2023-11-02 MED ORDER — SPIRONOLACTONE 25 MG PO TABS: 25.0000 mg | ORAL_TABLET | Freq: Every day | ORAL | 3 refills | Status: DC

## 2023-11-02 NOTE — Telephone Encounter (Signed)
 Spoke with pt who is aware to increase Spironolactone to 25 mg daily and have BMP in 2 weeks.  RX sent into [facility] [address] as requested.  Lab ordered and released.

## 2023-11-03 DIAGNOSIS — H6123 Impacted cerumen, bilateral: Secondary | ICD-10-CM | POA: Diagnosis not present

## 2023-11-07 DIAGNOSIS — H401123 Primary open-angle glaucoma, left eye, severe stage: Secondary | ICD-10-CM | POA: Diagnosis not present

## 2023-11-07 DIAGNOSIS — H401112 Primary open-angle glaucoma, right eye, moderate stage: Secondary | ICD-10-CM | POA: Diagnosis not present

## 2023-11-08 DIAGNOSIS — H3581 Retinal edema: Secondary | ICD-10-CM | POA: Diagnosis not present

## 2023-11-08 DIAGNOSIS — H401123 Primary open-angle glaucoma, left eye, severe stage: Secondary | ICD-10-CM | POA: Diagnosis not present

## 2023-11-08 DIAGNOSIS — H30033 Focal chorioretinal inflammation, peripheral, bilateral: Secondary | ICD-10-CM | POA: Diagnosis not present

## 2023-11-08 DIAGNOSIS — Z947 Corneal transplant status: Secondary | ICD-10-CM | POA: Diagnosis not present

## 2023-11-08 DIAGNOSIS — Z79899 Other long term (current) drug therapy: Secondary | ICD-10-CM | POA: Diagnosis not present

## 2023-11-08 DIAGNOSIS — H353221 Exudative age-related macular degeneration, left eye, with active choroidal neovascularization: Secondary | ICD-10-CM | POA: Diagnosis not present

## 2023-11-08 DIAGNOSIS — H30102 Unspecified disseminated chorioretinal inflammation, left eye: Secondary | ICD-10-CM | POA: Diagnosis not present

## 2023-11-08 DIAGNOSIS — H401112 Primary open-angle glaucoma, right eye, moderate stage: Secondary | ICD-10-CM | POA: Diagnosis not present

## 2023-11-09 NOTE — Telephone Encounter (Signed)
 Dr Tresia Fruit is referring patient for sleep consult with Dr Omar Bibber. The patient is aware and is amenable to proceed. Her MRIs are scheduled for 11/23/23. The patient found out that her retina specialist had injected "pellets" into her eye and she has called him to find out if this is a type of metal in regards to her being able to get an MRI. She will let us  know as soon as she finds out. She is going to have another cornea surgery in July of note as well.   Referral for sleep consult placed to Dr Omar Bibber.

## 2023-11-09 NOTE — Addendum Note (Signed)
 Addended by: Burns Carwin on: 11/09/2023 03:50 PM   Modules accepted: Orders

## 2023-11-16 DIAGNOSIS — M25512 Pain in left shoulder: Secondary | ICD-10-CM | POA: Diagnosis not present

## 2023-11-16 DIAGNOSIS — M542 Cervicalgia: Secondary | ICD-10-CM | POA: Diagnosis not present

## 2023-11-16 DIAGNOSIS — H81399 Other peripheral vertigo, unspecified ear: Secondary | ICD-10-CM | POA: Diagnosis not present

## 2023-11-16 DIAGNOSIS — G43711 Chronic migraine without aura, intractable, with status migrainosus: Secondary | ICD-10-CM | POA: Diagnosis not present

## 2023-11-16 DIAGNOSIS — R2681 Unsteadiness on feet: Secondary | ICD-10-CM | POA: Diagnosis not present

## 2023-11-18 ENCOUNTER — Other Ambulatory Visit: Payer: Self-pay | Admitting: *Deleted

## 2023-11-18 DIAGNOSIS — I6523 Occlusion and stenosis of bilateral carotid arteries: Secondary | ICD-10-CM

## 2023-11-20 ENCOUNTER — Other Ambulatory Visit

## 2023-11-22 DIAGNOSIS — M542 Cervicalgia: Secondary | ICD-10-CM | POA: Diagnosis not present

## 2023-11-22 DIAGNOSIS — R2681 Unsteadiness on feet: Secondary | ICD-10-CM | POA: Diagnosis not present

## 2023-11-22 DIAGNOSIS — M25512 Pain in left shoulder: Secondary | ICD-10-CM | POA: Diagnosis not present

## 2023-11-22 DIAGNOSIS — H81399 Other peripheral vertigo, unspecified ear: Secondary | ICD-10-CM | POA: Diagnosis not present

## 2023-11-24 ENCOUNTER — Encounter: Payer: Self-pay | Admitting: Physician Assistant

## 2023-11-24 ENCOUNTER — Ambulatory Visit: Attending: Vascular Surgery | Admitting: Physician Assistant

## 2023-11-24 ENCOUNTER — Ambulatory Visit (HOSPITAL_COMMUNITY)
Admission: RE | Admit: 2023-11-24 | Discharge: 2023-11-24 | Disposition: A | Discharge status: Home / Self Care | Source: Ambulatory Visit | Attending: Vascular Surgery | Admitting: Vascular Surgery

## 2023-11-24 VITALS — BP 147/93 | HR 74 | Temp 98.2°F | Wt 116.4 lb

## 2023-11-24 DIAGNOSIS — I6523 Occlusion and stenosis of bilateral carotid arteries: Secondary | ICD-10-CM

## 2023-11-24 NOTE — Progress Notes (Signed)
 Office Note   History of Present Illness   Brianna [name] is a 73 y.o. (1951/03/28) female who presents for surveillance of carotid artery stenosis.  She has a remote history of left carotid endarterectomy in 2009 by Dr. Sabra Cramp, for symptomatic carotid artery stenosis.  She returns today for follow-up.  Overall she is doing well without any complaints.  She denies any strokelike symptoms such as slurred speech, facial droop, sudden weakness/numbness, or sudden visual changes.  She is legally blind in the left eye and says that she is getting a cornea transplant soon.   Current Outpatient Medications  Medication Sig Dispense Refill   ascorbic Acid (VITAMIN C) 500 MG CPCR Take 500 mg by mouth daily.     aspirin  325 MG tablet Take 325 mg by mouth daily.     botulinum toxin Type A  (BOTOX ) 100 units SOLR injection Inject IM into the head and neck muscles every 3 months by provider in office 2 vial 3   butalbital -acetaminophen -caffeine  (FIORICET, ESGIC) 50-325-40 MG tablet Take 1 tablet by mouth every 6 (six) hours as needed for headache. 10 tablet 4   Calcium  Carbonate-Vit D-Min (CALCIUM  1200 PO) Take 2 capsules by mouth as needed.     chlorthalidone  (HYGROTON ) 25 MG tablet TAKE 1 TABLET(25 MG) BY MOUTH DAILY 90 tablet 3   Cholecalciferol (VITAMIN D  PO) Take 1,000 Units by mouth daily.      Difluprednate  0.05 % EMUL Place 1 drop into the left eye in the morning, at noon, in the evening, and at bedtime.     Eptinezumab -jjmr (VYEPTI ) 100 MG/ML injection Inject 1 mL (100 mg total) into the vein every 3 (three) months. 1.12 mL 4   Evolocumab  (REPATHA  SURECLICK) 140 MG/ML SOAJ Inject 140 mg into the skin every 14 (fourteen) days. 6 mL 1   ezetimibe  (ZETIA ) 10 MG tablet Take 1 tablet (10 mg total) by mouth daily. 90 tablet 0   FORTEO  600 MCG/2.4ML SOPN INJECT UNDER THE SKIN ONCE DAILY. DISCARD PEN AFTER 28 DAYS 9.6 mL 0   HUMIRA PEN 40 MG/0.4ML PNKT Inject 40 mg into the skin every 14  (fourteen) days. As directed     Insulin  Pen Needle 31G X 5 MM MISC Use to inject Forteo  once daily. DO NOT REUSE. Forteo  received through patient assistance 100 each 2   Lacosamide  100 MG TABS Take 1 tablet (100 mg total) by mouth in the morning and at bedtime. 60 tablet 5   leflunomide (ARAVA) 20 MG tablet Take 20 mg by mouth daily.     LORazepam  (ATIVAN ) 0.5 MG tablet Take 0.5 mg by mouth daily as needed for anxiety or sleep.     losartan  (COZAAR ) 100 MG tablet TAKE 1 TABLET(100 MG) BY MOUTH DAILY 90 tablet 3   pantoprazole  (PROTONIX ) 40 MG tablet TAKE 1 TABLET(40 MG) BY MOUTH DAILY 90 tablet 3   phenytoin  (DILANTIN ) 100 MG ER capsule Take 2 capsules (200 mg total) by mouth daily. 60 capsule 1   promethazine  (PHENERGAN ) 25 MG tablet Take 1 tablet (25 mg total) by mouth every 6 (six) hours as needed for nausea or vomiting. 30 tablet 11   RESTASIS  0.05 % ophthalmic emulsion 2 (two) times daily.     rosuvastatin  (CRESTOR ) 20 MG tablet Take 1 tablet (20 mg total) by mouth daily. 30 tablet 10   spironolactone  (ALDACTONE ) 25 MG tablet Take 1 tablet (25 mg total) by mouth daily. 90 tablet 3   SUMAtriptan  (IMITREX ) 6  MG/0.5ML SOLN injection Inject 0.5 mLs (6 mg total) into the skin every 2 (two) hours as needed for migraine or headache. Take one dose at headache onset, can take additional dose 2hrs later if needed. No more then 2 injections in 24hrs (each kit contains 2 injections) 6 mL 11   tacrolimus  (PROGRAF ) 1 MG capsule Take 1 mg by mouth daily.     Current Facility-Administered Medications  Medication Dose Route Frequency Provider Last Rate Last Admin   0.9 %  sodium chloride  infusion   Intravenous PRN Ardia Kraft, PA-C        REVIEW OF SYSTEMS (negative unless checked):   Cardiac:  []  Chest pain or chest pressure? []  Shortness of breath upon activity? []  Shortness of breath when lying flat? []  Irregular heart rhythm?  Vascular:  []  Pain in calf, thigh, or hip brought on by  walking? []  Pain in feet at night that wakes you up from your sleep? []  Blood clot in your veins? []  Leg swelling?  Pulmonary:  []  Oxygen at home? []  Productive cough? []  Wheezing?  Neurologic:  []  Sudden weakness in arms or legs? []  Sudden numbness in arms or legs? []  Sudden onset of difficult speaking or slurred speech? []  Temporary loss of vision in one eye? []  Problems with dizziness?  Gastrointestinal:  []  Blood in stool? []  Vomited blood?  Genitourinary:  []  Burning when urinating? []  Blood in urine?  Psychiatric:  []  Major depression  Hematologic:  []  Bleeding problems? []  Problems with blood clotting?  Dermatologic:  []  Rashes or ulcers?  Constitutional:  []  Fever or chills?  Ear/Nose/Throat:  []  Change in hearing? []  Nose bleeds? []  Sore throat?  Musculoskeletal:  []  Back pain? []  Joint pain? []  Muscle pain?   Physical Examination   Vitals:   11/24/23 1112 11/24/23 1118  BP: (!) 150/84 (!) 147/93  Pulse: 74   Temp: 98.2 F (36.8 C)   TempSrc: Temporal   Weight: 116 lb 6.4 oz (52.8 kg)    Body mass index is 22.73 kg/m.  General:  WDWN in NAD; vital signs documented above Gait: Not observed HENT: WNL, normocephalic Pulmonary: normal non-labored breathing , without rales, rhonchi,  wheezing Cardiac: Regular Abdomen: soft, NT, no masses Skin: without rashes Vascular Exam/Pulses: palpable radial pulses bilaterally Extremities: without ischemic changes, without gangrene , without cellulitis; without open wounds;  Musculoskeletal: no muscle wasting or atrophy  Neurologic: A&O X 3;  No focal weakness or paresthesias are detected Psychiatric:  The pt has Normal affect.  Non-Invasive Vascular Imaging   Bilateral Carotid Duplex (11/24/2023):  R ICA stenosis:  1-39% R VA:  patent and antegrade L ICA stenosis:  1-39% L VA:  patent and antegrade   Medical Decision Making   Brianna [name] is a 73 y.o. female who presents for surveillance  of carotid artery stenosis  Based on the patient's vascular studies, her carotid artery stenosis is unchanged bilaterally at 1 to 39% She denies any strokelike symptoms such as slurred speech, facial droop, sudden visual changes, or sudden weakness/numbness She has palpable and equal radial pulses bilaterally.  She has no slurred speech, tongue deviation, or sensorimotor deficits on exam She can follow-up with our office in 1 year with repeat carotid duplex   Deneise Finlay PA-C [facility] Office: [phone]  Call MD: Edgardo Goodwill

## 2023-11-25 DIAGNOSIS — M542 Cervicalgia: Secondary | ICD-10-CM | POA: Diagnosis not present

## 2023-11-25 DIAGNOSIS — M25512 Pain in left shoulder: Secondary | ICD-10-CM | POA: Diagnosis not present

## 2023-11-25 DIAGNOSIS — H81399 Other peripheral vertigo, unspecified ear: Secondary | ICD-10-CM | POA: Diagnosis not present

## 2023-11-25 DIAGNOSIS — R2681 Unsteadiness on feet: Secondary | ICD-10-CM | POA: Diagnosis not present

## 2023-11-29 ENCOUNTER — Other Ambulatory Visit: Payer: Self-pay | Admitting: Pharmacist

## 2023-11-29 ENCOUNTER — Telehealth: Payer: Self-pay | Admitting: Pharmacist

## 2023-11-29 DIAGNOSIS — Z947 Corneal transplant status: Secondary | ICD-10-CM | POA: Diagnosis not present

## 2023-11-29 DIAGNOSIS — H353221 Exudative age-related macular degeneration, left eye, with active choroidal neovascularization: Secondary | ICD-10-CM | POA: Diagnosis not present

## 2023-11-29 DIAGNOSIS — H401112 Primary open-angle glaucoma, right eye, moderate stage: Secondary | ICD-10-CM | POA: Diagnosis not present

## 2023-11-29 DIAGNOSIS — Z79899 Other long term (current) drug therapy: Secondary | ICD-10-CM | POA: Diagnosis not present

## 2023-11-29 DIAGNOSIS — Z961 Presence of intraocular lens: Secondary | ICD-10-CM | POA: Diagnosis not present

## 2023-11-29 DIAGNOSIS — H30102 Unspecified disseminated chorioretinal inflammation, left eye: Secondary | ICD-10-CM | POA: Diagnosis not present

## 2023-11-29 DIAGNOSIS — H30033 Focal chorioretinal inflammation, peripheral, bilateral: Secondary | ICD-10-CM | POA: Diagnosis not present

## 2023-11-29 DIAGNOSIS — H401123 Primary open-angle glaucoma, left eye, severe stage: Secondary | ICD-10-CM | POA: Diagnosis not present

## 2023-11-29 DIAGNOSIS — H3581 Retinal edema: Secondary | ICD-10-CM | POA: Diagnosis not present

## 2023-11-29 NOTE — Telephone Encounter (Signed)
 Received letter from Dr. Nazziola, DDS dated 11/23/2023. Pt is cleared to resume osteoporosis treatments as patient does not have any dental treatment plans involving altering jaw bones. Letter sent to scan center  Reclast  referral placed to [facility]  Geraldene Kleine, PharmD, MPH, BCPS, CPP Clinical Pharmacist (Rheumatology and Pulmonology)

## 2023-11-29 NOTE — Progress Notes (Signed)
 Received dental clearance for patient to resume osteoporosis treatment. Patient is transitioning to Reclast  for osteoporosis (completed Forteo  treatment in Dec 2024)  Dose: 5mg  IV every 12 months Premeds: acetaminophen  650mg  and diphenhydramine  25mg   Geraldene Kleine, PharmD, MPH, BCPS, CPP Clinical Pharmacist (Rheumatology and Pulmonology)

## 2023-12-01 NOTE — Progress Notes (Unsigned)
 Subjective:     Patient ID: Brianna [name], female   DOB: 1950-09-30     MRN: [id number]    Brief patient profile:  50 yowf  quit smoking age 73 with no resp problems then @  36 bad pneumonia.  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   No sign doe but not that active.   Sleeping ok without nocturnal  or early am exac of resp c/o's or need for noct saba.  rec Ok to restart boniva the first of September and every month but if your respiratory or reflux symptoms worsen it's first medication I would stop and take in it's place Reclast  IV yearly.    04/22/2017  [facility] office visit/ [name]  / re-establish re RA assoc ILD  Chief Complaint  Patient presents with   Pulmonary Consult    Self referral for cough x 3-4 months. She is coughing up white to yellow sputum.    joints doing great on humira but says "RA attacking both eyes " Has had more gerd attributed to "too much coffee" while on fosfamax x 2 years just on otc ppi prn  Cough onset early summer 2018  insidious/ persistent usually comes on first thing in am and sometimes wakes her up assoc with nasal congestion severe cough to point where she can't catch her breath but then does fine on gxt x 3.6  mph at 5-6 grades if not coughing.  rec Augmentin  875 mg take one pill twice daily  X 10 days - take at breakfast and supper with large glass of water.  It would help reduce the usual side effects (diarrhea and yeast infections) if you ate cultured yogurt at lunch.  Prednisone  10 mg take  4 each am x 2 days,   2 each am x 2 days,  1 each am x 2 days  and stop  Stop fosfamax for now  Pantoprazole  (protonix ) 40 mg   Take  30-60 min before first meal of the day and Pepcid  (famotidine )  20 mg one @  bedtime until return to office - this is the best way to tell whether stomach acid is contributing to your problem.   GERD diet      ENT ov 03/24/22 for loss of taste with nl exam x for throat clearing rec rx for GERD and note says pt unsure about meds    05/20/2022  f/u ov/[name] re: RA lung dz   maint on protonix  now at 40 / pepcid  20 mg s heart burn  any more   Chief Complaint  Patient presents with   Follow-up   Dyspnea: somewhat  limited by eye site /walking dog uphills./ not checking sats  Cough: just throat clearing - no excess am mucus  Sleeping: 30 degrees  SABA use: none 02: none Covid status:   vax x 2  Lung cancer screening :  none   Rec We will walk you today to compare to priors  To get the most out of exercise, you need to be continuously aware that  you are short of breath, Make sure you check your oxygen saturations at highest level of activity  If can't stop clearing throat,  return to ENT with your acid/reflux medication in hand or see Dr Brent Cambric [facility]       11/18/2022  f/u ov/[name] re: RA lung dz    maint on protonix  per DR Tova Fresh also upped pepcid  40 mg bid  Chief Complaint  Patient presents with   Follow-up    C/o allergies causing runny nose and nasal congestion.  Some cough.    Dyspnea:  no change / up hill walking dogs not consistently Cough: minimal / assoc pnds no longer on flonase Sleeping: 30 degree electric bed no resp cc  SABA use: none  02: none  Rec Make sure you check your oxygen saturation at your highest level of activity(NOT after you stop)  to be sure it stays over 90%  Cxr no change pf    12/02/2023  f/u ov/[name] re: RA lung dz  maint  on gerd rx/ Arava/humira  overall good control but now has severe cervical dz  Chief Complaint  Patient presents with   Follow-up    Breathing is stable. No new co's.    Dyspnea:  walking dogs though limited by viz/ balance / still doing hills  Cough: mostly related to pnds worse in winter Sleeping: 30 degrees HOB  resp cc  SABA use: none  02: none      No obvious day to day or daytime variability or assoc excess/ purulent sputum or mucus plugs or hemoptysis or cp or chest tightness, subjective wheeze or overt sinus or hb symptoms.    Also denies any obvious fluctuation of symptoms with weather or environmental changes or other aggravating or alleviating factors except as outlined above   No unusual exposure hx or h/o childhood pna/ asthma or knowledge of premature birth.  Current Allergies, Complete Past Medical History, Past Surgical History, Family History, and Social History were reviewed in [employer] record.   ROS  The following are not active complaints unless bolded Hoarseness, sore throat, dysphagia, dental problems, itching, sneezing,  nasal congestion or discharge of excess mucus or purulent secretions, ear ache,   fever, chills, sweats, unintended wt loss or wt gain, classically pleuritic or exertional cp,  orthopnea pnd or arm/hand swelling  or leg swelling, presyncope, palpitations, abdominal pain, anorexia, nausea, vomiting, diarrhea  or change in bowel habits or change in bladder habits, change in stools or change in urine, dysuria, hematuria,  rash, arthralgias, visual complaints, headache, numbness, weakness or ataxia or problems with walking or coordination,  change in mood or  memory.        Current Meds  Medication Sig   ascorbic Acid (VITAMIN C) 500 MG CPCR Take 500 mg by mouth daily.   aspirin  325 MG tablet Take 325 mg by mouth daily.   botulinum toxin Type A  (BOTOX ) 100 units SOLR injection Inject IM into the head and neck muscles every 3 months by provider in office   butalbital -acetaminophen -caffeine  (FIORICET, ESGIC) 50-325-40 MG tablet Take 1 tablet by mouth every 6 (six) hours as needed for headache.    Calcium  Carbonate-Vit D-Min (CALCIUM  1200 PO) Take 2 capsules by mouth as needed.   chlorthalidone  (HYGROTON ) 25 MG tablet TAKE 1 TABLET(25 MG) BY MOUTH DAILY   Cholecalciferol (VITAMIN D  PO) Take 1,000 Units by mouth daily.    Difluprednate  0.05 % EMUL Place 1 drop into the left eye in the  morning, at noon, in the evening, and at bedtime.   Eptinezumab -jjmr (VYEPTI ) 100 MG/ML injection Inject 1 mL (100 mg total) into the vein every 3 (three) months.   Evolocumab  (REPATHA  SURECLICK) 140 MG/ML SOAJ Inject 140 mg into the skin every 14 (fourteen) days.   ezetimibe  (ZETIA ) 10 MG tablet Take 1 tablet (10 mg total) by mouth daily.   FORTEO  600 MCG/2.4ML SOPN INJECT UNDER THE SKIN ONCE DAILY. DISCARD PEN AFTER 28 DAYS   HUMIRA PEN 40 MG/0.4ML PNKT Inject 40 mg into the skin every 14 (fourteen) days. As directed   Insulin  Pen Needle 31G X 5 MM MISC Use to inject Forteo  once daily. DO NOT REUSE. Forteo  received through patient assistance   Lacosamide  100 MG TABS Take 1 tablet (100 mg total) by mouth in the morning and at bedtime.   leflunomide (ARAVA) 20 MG tablet Take 20 mg by mouth daily.   LORazepam  (ATIVAN ) 0.5 MG tablet Take 0.5 mg by mouth daily as needed for anxiety or sleep.   losartan  (COZAAR ) 100 MG tablet TAKE 1 TABLET(100 MG) BY MOUTH DAILY   pantoprazole  (PROTONIX ) 40 MG tablet TAKE 1 TABLET(40 MG) BY MOUTH DAILY   phenytoin  (DILANTIN ) 100 MG ER capsule Take 2 capsules (200 mg total) by mouth daily.   promethazine  (PHENERGAN ) 25 MG tablet Take 1 tablet (25 mg total) by mouth every 6 (six) hours as needed for nausea or vomiting.   RESTASIS  0.05 % ophthalmic emulsion 2 (two) times daily.   rosuvastatin  (CRESTOR ) 20 MG tablet Take 1 tablet (20 mg total) by mouth daily.   spironolactone  (ALDACTONE ) 25 MG tablet Take 1 tablet (25 mg total) by mouth daily.   SUMAtriptan  (IMITREX ) 6 MG/0.5ML SOLN injection Inject 0.5 mLs (6 mg total) into the skin every 2 (two) hours as needed for migraine  or headache. Take one dose at headache onset, can take additional dose 2hrs later if needed. No more then 2 injections in 24hrs (each kit contains 2 injections)   tacrolimus  (PROGRAF ) 1 MG capsule Take 1 mg by mouth daily.   Current Facility-Administered Medications for the 12/02/23 encounter (Office Visit) with Diamond Formica, MD  Medication   0.9 %  sodium chloride  infusion              Objective:   Physical Exam   wts   12/02/2023    115 11/18/2022      114  05/20/2022    115  09/22/2021      113  09/30/2020    118 06/23/2020    122  06/22/2019  128  06/21/2018    126   Wt 130 01/14/2011 > 08/27/2013  126 >  11/13/2013 125 > 04/22/2017  125 > 06/14/2017   127 > 10/11/2017  124     Vital signs reviewed  12/02/2023  - Note at rest 02 sats  92% on RA   General appearance:   pleasant amb wf nad   HEENT : Oropharynx  clear     Nasal turbinates nl    NECK :  without  apparent JVD/ palpable Nodes/TM    LUNGS: no acc muscle use,  Nl contour chest with minimal insp crackles bases bilaterally without cough on insp or exp maneuvers   CV:  RRR  no s3 or murmur or increase in P2, and no edema   ABD:  soft and nontender   MS:  Gait nl  ext warm without deformities Or obvious joint restrictions  calf  tenderness, cyanosis or clubbing    SKIN: warm and dry without lesions    NEURO:  alert, approp, nl sensorium with  no motor or cerebellar deficits apparent.       Assessment:

## 2023-12-02 ENCOUNTER — Ambulatory Visit: Admitting: Internal Medicine

## 2023-12-02 ENCOUNTER — Encounter: Payer: Self-pay | Admitting: Internal Medicine

## 2023-12-02 VITALS — BP 124/74 | HR 77 | Ht 60.0 in | Wt 115.0 lb

## 2023-12-02 DIAGNOSIS — J8410 Pulmonary fibrosis, unspecified: Secondary | ICD-10-CM

## 2023-12-02 NOTE — Assessment & Plan Note (Signed)
 Assoc with RA      - PFT's 06/15/2006 FEV1 (1.53)  Ratio 84 and DLC0 46 corrects to 90%     - PFT's  03/04/2011  FEV1 (1.73)  Ratio 83 and VC 2.11 and DLC0 63% corrects  94%     - PFT's 11/13/2013    VC 1.94 with DLCO 65 corrects to 102%     - 08/27/2013  Walked RA x 2 laps @ 185 ft each stopped due to  Oximeter stopped recording accurately but sats still 96% and no sob - PFT's  06/14/2017  FVC 1.71  (60 % ) ratio 89  p 5 % improvement from saba p nothing prior to study with DLCO  59 % corrects to 97 % for alv volume   PFT's  06/21/2018  FVC  1.85 (65%)   p no % improvement from saba p nothing prior to study with DLCO  65 % corrects to 106  % for alv volume   PFT's  06/22/2019  FVC 1.68 (58 % )   with DLCO  12.93 (70%) corrects to 4.59 (108 %)  for alv volume and FV curve nl   CHEST  HRCT  03/03/20  1. Pulmonary parenchymal pattern of fibrosis appears grossly stable from 07/21/2010. Findings are consistent with UIP per consensus guidelines: Diagnosis of Idiopathic Pulmonary Fibrosis: An Official ATS/ERS/JRS/ALAT Clinical Practice Guideline. Am Annie Barton Crit Care Med Vol 198, Iss 5, [phone], Mar 19 2017. 2. Aortic atherosclerosis (ICD10-I70.0). Coronary artery calcification. 3. Enlarged pulmonic trunk, indicative of pulmonary arterial hypertension.      - Echo   05/12/20  LVH nod / GII diastolic dysfunction /  PAS around 40 with nl RV -  06/23/2020   Walked RA  3laps @ approx 295ft each @ brisk pace  stopped due to end of study, no desats, no sob or CP with sats at end = 97% - 05/20/2022   Walked on RA  x  3  lap(s) =  approx 750  ft  @ fast pace, stopped due to end of study s sob  with lowest 02 sats 94%  - .PFT's  05/20/2022    FVC 1.68 (60% )   with DLCO  11.53 (63%) corrects to  93%  for alv volume and FV curve nl   - 12/02/2023   Walked on RA  x  3  lap(s) =  approx 750  ft  @ mod pace, stopped due to end of study with lowest 02 sats 97% and no sob /cp/cough    No clinical evidence of progression  of ILD assoc with RA and overall RA is in pretty good shape per pt's report so f/u for pulmonary complications can be yearly unless new symptoms arise  Discussed in detail all the  indications, usual  risks and alternatives  relative to the benefits with patient who agrees to proceed with conservative f/u as outlined    Each maintenance medication was reviewed in detail including emphasizing most importantly the difference between maintenance and prns and under what circumstances the prns are to be triggered using an action plan format where appropriate.  Total time for H and P, chart review, counseling,  directly observing portions of ambulatory 02 saturation study/ and generating customized AVS unique to this office visit / same day charting = 30 min yearly f/u

## 2023-12-02 NOTE — Progress Notes (Signed)
 Reclast  scheduled for 12/06/2023

## 2023-12-02 NOTE — Patient Instructions (Signed)
Make sure you check your oxygen saturation at your highest level of activity(NOT after you stop)  to be sure it stays over 90% and keep track of it at least once a week, more often if breathing getting worse, and let me know if losing ground. (Collect the dots to connect the dots approach)     Please schedule a follow up visit in 12  months but call sooner if needed

## 2023-12-06 ENCOUNTER — Ambulatory Visit (INDEPENDENT_AMBULATORY_CARE_PROVIDER_SITE_OTHER)

## 2023-12-06 VITALS — BP 152/81 | HR 72 | Temp 98.4°F | Resp 14 | Ht 60.0 in | Wt 116.6 lb

## 2023-12-06 DIAGNOSIS — M810 Age-related osteoporosis without current pathological fracture: Secondary | ICD-10-CM

## 2023-12-06 DIAGNOSIS — M5126 Other intervertebral disc displacement, lumbar region: Secondary | ICD-10-CM

## 2023-12-06 DIAGNOSIS — M25512 Pain in left shoulder: Secondary | ICD-10-CM | POA: Diagnosis not present

## 2023-12-06 DIAGNOSIS — H81399 Other peripheral vertigo, unspecified ear: Secondary | ICD-10-CM | POA: Diagnosis not present

## 2023-12-06 DIAGNOSIS — R2681 Unsteadiness on feet: Secondary | ICD-10-CM | POA: Diagnosis not present

## 2023-12-06 DIAGNOSIS — M542 Cervicalgia: Secondary | ICD-10-CM | POA: Diagnosis not present

## 2023-12-06 MED ORDER — ZOLEDRONIC ACID 5 MG/100ML IV SOLN
5.0000 mg | Freq: Once | INTRAVENOUS | Status: AC
Start: 2023-12-06 — End: 2023-12-06
  Administered 2023-12-06: 5 mg via INTRAVENOUS
  Filled 2023-12-06: qty 100

## 2023-12-06 MED ORDER — SODIUM CHLORIDE 0.9 % IV SOLN
INTRAVENOUS | Status: DC
Start: 2023-12-06 — End: 2023-12-06

## 2023-12-06 MED ORDER — ACETAMINOPHEN 325 MG PO TABS
650.0000 mg | ORAL_TABLET | Freq: Once | ORAL | Status: AC
  Administered 2023-12-06: 650 mg via ORAL
  Filled 2023-12-06: qty 2

## 2023-12-06 MED ORDER — DIPHENHYDRAMINE HCL 25 MG PO CAPS
25.0000 mg | ORAL_CAPSULE | Freq: Once | ORAL | Status: AC
  Administered 2023-12-06: 25 mg via ORAL
  Filled 2023-12-06: qty 1

## 2023-12-06 NOTE — Progress Notes (Signed)
 Diagnosis: Iron Deficiency Anemia  Provider:  Praveen Mannam MD  Procedure: IV Infusion  IV Type: Peripheral, IV Location: L Antecubital  Reclast  (Zolendronic Acid), Dose: 5 mg  Infusion Start Time: 1456  Infusion Stop Time: 1529  Post Infusion IV Care: Observation period completed and Peripheral IV Discontinued  Discharge: Condition: Good, Destination: Home . AVS Provided  Performed by:  Star East, LPN

## 2023-12-07 ENCOUNTER — Ambulatory Visit (INDEPENDENT_AMBULATORY_CARE_PROVIDER_SITE_OTHER): Admitting: Neurology

## 2023-12-07 ENCOUNTER — Encounter: Payer: Self-pay | Admitting: Neurology

## 2023-12-07 VITALS — BP 152/89 | HR 72 | Ht 60.0 in | Wt 116.0 lb

## 2023-12-07 DIAGNOSIS — R5383 Other fatigue: Secondary | ICD-10-CM | POA: Diagnosis not present

## 2023-12-07 DIAGNOSIS — G4734 Idiopathic sleep related nonobstructive alveolar hypoventilation: Secondary | ICD-10-CM

## 2023-12-07 DIAGNOSIS — R519 Headache, unspecified: Secondary | ICD-10-CM

## 2023-12-07 DIAGNOSIS — G43719 Chronic migraine without aura, intractable, without status migrainosus: Secondary | ICD-10-CM

## 2023-12-07 DIAGNOSIS — E611 Iron deficiency: Secondary | ICD-10-CM

## 2023-12-07 NOTE — Progress Notes (Signed)
 SLEEP MEDICINE CLINIC    Provider:  Neomia Banner, MD  Primary Care Physician:  Pete Brand, DO [address] [place] [address] [place] [address]     Referring Provider: Glory Larsen, Md [address] [place],  [place] [address]          Chief Complaint according to patient   Patient presents with:     New Patient (Initial Visit)           HISTORY OF PRESENT ILLNESS:   I have the pleasure of seeing Brianna [name] 12/07/23 a right -handed female with a possible sleep disorder.  NATALIYA [name] is a 73 y.o. female patient who is seen upon Dr Harding Li referral on 12/07/2023 . Chief concern according to patient :  " I had an abnormal ONO but a daytime oximetry was normal " I have chronic migraine, wake up with  dull headaches, rarely woken by headaches". Those are  located within the right eye  or temple- stabbing, shooting, very severe and sudden- creating an apprehension of sleep".  Movement, light and sound and smell can all trigger a migraine.      Sleep relevant medical history: Nocturia 2-3 times, night terrors, but rarely now - sleep paralysis, Tonsillectomy and adenoid ectome, CEA surgery, cervical spine DDD,  smoker in the past. No shift work history, macular degeneration and glaucoma.     Family medical /sleep history: No other family member on CPAP with OSA, migraine in mother, brother and daughter.    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 He lives in a household with husband and 2 dogs-  adult children.   Tobacco use: quit 20 years ago.   ETOH use : 2/ month,  Caffeine  intake in form of Coffee( 2-4 mugs a day ) Soda( /) Tea ( /) or energy drinks Exercise : limited by vision decline .    Hobbies :volunteering     Sleep habits are as follows:   The bedrrom is cool, quite and dark.  [name] dinner time is between 5-6 PM. The patient goes to bed at 9.30-10.30  PM and  she takes eye  drops every 3 hours-  dogs join her bed, husband has used another bedroom, continues to sleep for a total of 6-7  hours, wakes for 3 bathroom breaks. The preferred sleep position is right side, with the support of 2 pillows and shoulder and knee support  Dreams are reportedly frequent/vivid.  The patient wakes up spontaneously or by her dogs-7  AM is the usual rise time.  She reports not feeling refreshed or restored in AM, with symptoms such as dry mouth, dry eyes, morning headaches, and residual fatigue.  Craving ICE ( PICA )  Naps are taken  to help with a headache , lasting from 30 to 120 minutes and are sometimes refreshing , they help with headaches.   Test result from Dr Tresia Fruit: An overnight pulse oximetry had been ordered by Dr. Narciso Backers and the analyzed test duration was 5 hours 17 minutes.  This was on October 12, 2023 and recorded between 2:10 AM and 8:50 AM.  [name] pulse rate varied between 46 and 79 bpm there was a drop in oxygen between 550 and 6:30 AM which could have been motion artifact but I would have expected a correlating pulse abnormality visit and the pulse was regular.  So I do not think that  the patient was motion was moving or awake at the time and this may have been a REM sleep episode.  The total time under 89% oxygenation was 5 minutes 53 seconds.    I would like for the patient to have a more reliable home sleep test.   Review of Systems: Out of a complete 14 system review, the patient complains of only the following symptoms, and all other reviewed systems are negative.:  Fatigue, sleepiness , snoring, fragmented sleep,  Insomnia,  Nocturia   Headache.    How likely are you to doze in the following situations: 0 = not likely, 1 = slight chance, 2 = moderate chance, 3 = high chance   Sitting and Reading? Watching Television? Sitting inactive in a public place (theater or meeting)? As a passenger in a car for an hour without a break? Lying down in the  afternoon when circumstances permit? Sitting and talking to someone? Sitting quietly after lunch without alcohol? In a car, while stopped for a few minutes in traffic?   Total = 5/ 24 points   FSS endorsed at 27/ 63 points.  GDS : 6/ 15 - anxiety about vision.   Social History   Socioeconomic History   Marital status: Married    Spouse name: Brianna [name]   Number of children: 2   Years of education: Bachelor   Highest education level: Bachelor's degree (e.g., BA, AB, BS)  Occupational History   Occupation: Runner, broadcasting/film/video   Occupation: [employer] features editor: [employer]    Comment: retired  Tobacco Use   Smoking status: Former    Current packs/day: 0.00    Average packs/day: 0.5 packs/day for 14.0 years (7.0 ttl pk-yrs)    Types: Cigarettes    Start date: 42    Quit date: 1989    Years since quitting: 36.4    Passive exposure: Never   Smokeless tobacco: Never  Vaping Use   Vaping status: Never Used  Substance and Sexual Activity   Alcohol use: No   Drug use: No   Sexual activity: Not on file  Other Topics Concern   Not on file  Social History Narrative   Patient is married to Brianna [name]), has 2 children   Patient is right handed   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   Caffeine  consumption is about 2 cups daily   Lives at home with husband    Social Drivers of Corporate investment banker Strain: Not on file  Food Insecurity: Not on file  Transportation Needs: Not on file  Physical Activity: Not on file  Stress: Not on file  Social Connections: Not on file    Family History  Problem Relation Age of Onset   Heart disease Mother    Lung disease Mother        ? disease process   Uterine cancer Mother    Heart disease Father    Clotting disorder Father    Collagen disease Father    Cervical cancer Maternal Aunt    Prostate cancer Maternal Grandfather    Rheum arthritis Sister    High Cholesterol Sister    Epilepsy Sister    Rheum arthritis Maternal  Grandmother    High Cholesterol Brother    High blood pressure Brother    High blood pressure Brother    High Cholesterol Brother    High blood pressure Brother    High Cholesterol Brother     Past Medical History:  Diagnosis Date  Arthritis    in the neck   Carotid artery occlusion    Epilepsy (HCC)    GERD (gastroesophageal reflux disease)    Glaucoma    Hyperlipidemia    Hypertension    Keratoconus of both eyes 1981   Macular degeneration    Migraines    Peripheral vascular disease (HCC)    carotid occlusion surgery on left   Raynaud's disease    Retinal edema    Rheumatoid arthritis(714.0)    Stroke (HCC)    two mild strokes presumed from left carotid stenosis    Past Surgical History:  Procedure Laterality Date   CAROTID ARTERY ANGIOPLASTY  2008   Dr Sabra Cramp   CAROTID ENDARTERECTOMY Left 11-15-2007   cataract extraction Right 04-2014   [facility]   CHOLECYSTECTOMY  1995   CORNEAL TRANSPLANT     x 5 ; steroid inj. 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   [facility]   CORNEAL TRANSPLANT Left 04/17/2019   EYE SURGERY Right 12/2016   cornea repair    EYE SURGERY Right 09/20/2017   cornea transplant    GLAUCOMA SURGERY Right 2018   ORIF PATELLA Left 02/12/2021   Procedure: OPEN REDUCTION INTERNAL (ORIF) FIXATION PATELLA;  Surgeon: Janeth Medicus, MD;  Location: [facility] OR;  Service: Orthopedics;  Laterality: Left;   TRIGGER FINGER RELEASE Right 05/16/2018   Procedure: RELEASE TRIGGER FINGER/A-1 PULLEY RIGHT THUMB;  Surgeon: Lyanne Sample, MD;  Location: [facility];  Service: Orthopedics;  Laterality: Right;     Current Outpatient Medications on File Prior to Visit  Medication Sig Dispense Refill   ascorbic Acid (VITAMIN C) 500 MG CPCR Take 500 mg by mouth daily.     aspirin  325 MG tablet Take 325 mg by mouth daily.     botulinum toxin Type A  (BOTOX ) 100 units SOLR injection Inject IM into the head and neck muscles every 3  months by provider in office 2 vial 3   butalbital -acetaminophen -caffeine  (FIORICET, ESGIC) 50-325-40 MG tablet Take 1 tablet by mouth every 6 (six) hours as needed for headache. 10 tablet 4   Calcium  Carbonate-Vit D-Min (CALCIUM  1200 PO) Take 2 capsules by mouth as needed.     chlorthalidone  (HYGROTON ) 25 MG tablet TAKE 1 TABLET(25 MG) BY MOUTH DAILY 90 tablet 3   Cholecalciferol (VITAMIN D  PO) Take 1,000 Units by mouth daily.      Difluprednate  0.05 % EMUL Place 1 drop into the left eye in the morning, at noon, in the evening, and at bedtime.     Eptinezumab -jjmr (VYEPTI ) 100 MG/ML injection Inject 1 mL (100 mg total) into the vein every 3 (three) months. 1.12 mL 4   Evolocumab  (REPATHA  SURECLICK) 140 MG/ML SOAJ Inject 140 mg into the skin every 14 (fourteen) days. 6 mL 1   ezetimibe  (ZETIA ) 10 MG tablet Take 1 tablet (10 mg total) by mouth daily. 90 tablet 0   FORTEO  600 MCG/2.4ML SOPN INJECT UNDER THE SKIN ONCE DAILY. DISCARD PEN AFTER 28 DAYS 9.6 mL 0   HUMIRA PEN 40 MG/0.4ML PNKT Inject 40 mg into the skin every 14 (fourteen) days. As directed     Insulin  Pen Needle 31G X 5 MM MISC Use to inject Forteo  once daily. DO NOT REUSE. Forteo  received through patient assistance 100 each 2   Lacosamide  100 MG TABS Take 1 tablet (100 mg total) by mouth in the morning and at bedtime. 60 tablet 5   leflunomide (ARAVA) 20 MG  tablet Take 20 mg by mouth daily.     LORazepam  (ATIVAN ) 0.5 MG tablet Take 0.5 mg by mouth daily as needed for anxiety or sleep.     losartan  (COZAAR ) 100 MG tablet TAKE 1 TABLET(100 MG) BY MOUTH DAILY 90 tablet 3   pantoprazole  (PROTONIX ) 40 MG tablet TAKE 1 TABLET(40 MG) BY MOUTH DAILY 90 tablet 3   phenytoin  (DILANTIN ) 100 MG ER capsule Take 2 capsules (200 mg total) by mouth daily. 60 capsule 1   promethazine  (PHENERGAN ) 25 MG tablet Take 1 tablet (25 mg total) by mouth every 6 (six) hours as needed for nausea or vomiting. 30 tablet 11   RESTASIS  0.05 % ophthalmic emulsion  2 (two) times daily.     rosuvastatin  (CRESTOR ) 20 MG tablet Take 1 tablet (20 mg total) by mouth daily. 30 tablet 10   spironolactone  (ALDACTONE ) 25 MG tablet Take 1 tablet (25 mg total) by mouth daily. 90 tablet 3   SUMAtriptan  (IMITREX ) 6 MG/0.5ML SOLN injection Inject 0.5 mLs (6 mg total) into the skin every 2 (two) hours as needed for migraine or headache. Take one dose at headache onset, can take additional dose 2hrs later if needed. No more then 2 injections in 24hrs (each kit contains 2 injections) 6 mL 11   tacrolimus  (PROGRAF ) 1 MG capsule Take 1 mg by mouth daily.     Current Facility-Administered Medications on File Prior to Visit  Medication Dose Route Frequency Provider Last Rate Last Admin   0.9 %  sodium chloride  infusion   Intravenous PRN Thompson, Kathryn R, PA-C        Allergies  Allergen Reactions   Morphine Other (See Comments)    Difficulty breathing   Morphine And Codeine Shortness Of Breath   Alphagan [Brimonidine]     Eyelid swelling, scratching Allergic to preservative contained in this med   Amlodipine  Other (See Comments)    Causes my gums to grow up on my teeth.  Have had to have them cut back twice   Codeine Nausea And Vomiting   Dorzolamide Hcl-Timolol Mal     Inflammation of the eyelid, scratching Allergic to preservative contained in this med   Hydrocodone-Acetaminophen  Nausea And Vomiting    But tolerates tylenol    Sulfa Antibiotics Rash    "Large bumps"   Sulfamethoxazole Rash     DIAGNOSTIC DATA (LABS, IMAGING, TESTING) - I reviewed patient records, labs, notes, testing and imaging myself where available.  Lab Results  Component Value Date   WBC 10.5 10/05/2023   HGB 11.8 10/05/2023   HCT 37.3 10/05/2023   MCV 84.8 10/05/2023   PLT 295 10/05/2023      Component Value Date/Time   NA 138 10/05/2023 1023   NA 140 03/18/2021 0914   K 5.1 10/05/2023 1023   CL 99 10/05/2023 1023   CO2 32 10/05/2023 1023   GLUCOSE 104 (H) 10/05/2023 1023    BUN 12 10/05/2023 1023   BUN 11 03/18/2021 0914   CREATININE 0.68 10/05/2023 1023   CALCIUM  9.4 10/05/2023 1023   PROT 7.2 10/05/2023 1023   PROT 7.0 03/18/2021 0914   ALBUMIN 4.3 03/18/2021 0914   AST 29 10/05/2023 1023   ALT 22 10/05/2023 1023   ALKPHOS 104 03/18/2021 0914   BILITOT 0.3 10/05/2023 1023   BILITOT 0.2 03/18/2021 0914   GFRNONAA 79 08/19/2023 0853   GFRNONAA 95 12/13/2018 1352   GFRAA 107 05/26/2020 1607   GFRAA 110 12/13/2018 1352   Lab Results  Component Value Date   CHOL 140 03/18/2021   HDL 71 03/18/2021   LDLCALC 49 03/18/2021   TRIG 113 03/18/2021   CHOLHDL 2.0 03/18/2021   Lab Results  Component Value Date   HGBA1C 5.7 (H) 10/06/2023   Lab Results  Component Value Date   VITAMINB12 399 10/06/2023   Lab Results  Component Value Date   TSH 0.897 10/06/2023    PHYSICAL EXAM:  Today's Vitals   12/07/23 1003 12/07/23 1006  BP: (!) 165/87 (!) 152/89  Pulse: 72 72  Weight: 116 lb (52.6 kg)   Height: 5' (1.524 m)    Body mass index is 22.65 kg/m.   Wt Readings from Last 3 Encounters:  12/07/23 116 lb (52.6 kg)  12/06/23 116 lb 9.6 oz (52.9 kg)  12/02/23 115 lb (52.2 kg)     Ht Readings from Last 3 Encounters:  12/07/23 5' (1.524 m)  12/06/23 5' (1.524 m)  12/02/23 5' (1.524 m)      General: The patient is awake, alert and appears not in acute distress. The patient is well groomed. Head: Normocephalic, atraumatic. Neck is supple.  Mallampati 2,  neck circumference:14 inches . Nasal airflow not fully patent.   Retrognathia is not seen.  OVERBITE and crowded teeth. Small jaw.  Dental status: biological  Cardiovascular:  Regular rate and cardiac rhythm by pulse,  without distended neck veins. Respiratory: Lungs are clear to auscultation.  Skin:  Without evidence of ankle edema, or rash. Low skin turgor.  Trunk: [name] posture is erect.   NEUROLOGIC EXAM: The patient is awake and alert, oriented to place and time.   Memory  subjective described as intact.  Attention span & concentration ability appears normal.  Speech is fluent,  without  dysarthria, dysphonia or aphasia.  Mood and affect are appropriate.   Cranial nerves: no loss of smell or taste reported  Pupils are unequal , disrounded, cornea is not flush- right pupil not clearly reactive to light.  Left pupil is blown.   Left eye legally blind.  Restricted visual fields, only small center spot of vision. .  Extraocular movements in vertical and horizontal planes were intact and without nystagmus. No Diplopia. Hearing was impaired to soft voice     Facial sensation intact to fine touch.  Facial motor strength is symmetric and tongue and uvula move midline.  Neck ROM : stooped neck and shoulder shrug was symmetrical.    Motor exam:  Symmetric bulk, tone and ROM.   Normal tone without cog -wheeling, symmetric grip strength .   Sensory:  deferred, no focal loss reported    ASSESSMENT AND PLAN 74 y.o. year old female  here with:    1) sleep related morning headaches and cluster/  migraine headaches.  Can last 3 days in a row.  Chronic migraines with weather being a trigger, some foods, smells, sounds , lights and motion.   2) former smoker with an abnormal ONO.  3) history  of RF positive rheumatoid arthritis, with dry eyes and dry mouth- SICCA ? Likes to et ICE - Pica? Iron deficiency related - I reviewed hr labs- she is severely deficient of iron.    3) severely vision impaired patient who  at home is sharing the bed with her pets.   HST was declined by [facility], we will order in lab sleep study. Goal is to rule out sleep hypoxia and to treat with CPAP  if apnea is present.   Suggest iron infusion  therapy.    I plan to follow up either personally or through our NP within 3-5 months.   I would like to thank Pete Brand, DO and Glory Larsen, Md [address] [place],  [place] [address] for allowing me to meet with and to take care of this  pleasant patient.    After spending a total time of  45  minutes face to face and additional time for physical and neurologic examination, review of laboratory studies,  personal review of imaging studies, reports and results of other testing and review of referral information / records as far as provided in visit,   Electronically signed by: Neomia Banner, MD 12/07/2023 10:24 AM  [facility] Board certified by The [employer] of Sleep Medicine and Diplomate of the [employer] of Sleep Medicine. Board certified In Neurology through the ABPN, Fellow of the [employer] of Neurology.

## 2023-12-07 NOTE — Patient Instructions (Signed)
 We will meet again after your PSG study .   Sleep Study, Adult A sleep study (polysomnogram) is a series of tests done while you are sleeping. It is used to see how well you sleep, help diagnose a sleep disorder, or create a plan to treat your sleep disorder. Sleep studies are done at sleep centers, which may be inside a hospital, office, or clinic. Your health care provider may recommend a sleep study if you: Have brief periods in which you stop breathing during sleep (sleepapnea). Fall asleep suddenly during the day (narcolepsy). Have trouble falling asleep or staying asleep (insomnia). Feel like you need to move your legs when trying to fall asleep (restless legs syndrome). Move your legs by flexing and extending them regularly while asleep (periodic limb movement disorder). Act out your dreams while you sleep (sleep behavior disorder). Feel like you cannot move when you first wake up (sleep paralysis). What tests are part of a sleep study? Most sleep studies record the following during sleep: Brain activity. Eye and limb movements. Heart rate and rhythm and blood pressure. Breathing rate and rhythm and blood oxygen level. Chest and belly movement as you breathe. Snoring or other noises. Body position. Tell a health care provider about: Any allergies you have, especially to products with sticky surfaces (adhesives). All medicines you are taking, including vitamins, herbs, eye drops, creams, and over-the-counter medicines. Any medical conditions you have. What happens before the test? Your health care provider will let you know if you should stop taking any of your regular medicines before the test. Bring your pajamas and toothbrush with you to the sleep study. Do not have caffeine  on the day of your sleep study. Do not drink alcohol on the day of your sleep study. What happens during the test?     Most sleep studies are done during a normal period of time for a full night of  sleep. You will arrive at the study center in the evening and go home in the morning. The room where you have the study may look like a hospital room or a hotel room. For the test: Round, sticky patches with sensors attached to recording wires (electrodes) will be placed on your scalp, face, chest, and limbs. Wires from all the electrodes and sensors will run from your bed to a computer. The wires can be taken off and put back on if you need to get out of bed to go to the bathroom. A sensor will be placed over your nose to measure airflow. A finger clip will be put on your finger or ear to measure your blood oxygen level (pulse oximetry). Belts will be placed around your belly and chest to measure breathing movements. Monitoring will begin. The health care providers doing the study may come in and out of the room during the study. Most of the time, they will be in another room monitoring your test as you sleep. If you have signs of sleep apnea during your test, you may get a treatment mask to wear for the second half of the night. The mask provides positive airway pressure (PAP) to help you breathe better during sleep. This may greatly improve your sleep apnea. You will then have all tests done again with the mask in place to see if your measurements and recordings change. What can I expect after the test? A health care provider who specializes in sleep will evaluate the results of your sleep study and share them with you and your  primary health care provider. Based on your results, your medical history, and a physical exam, you may be diagnosed with a sleep disorder. Your health care team will help determine your treatment options based on your diagnosis. This may include: Improving your sleep habits (sleep hygiene). Wearing a continuous positive airway pressure (CPAP) or bi-level positive airway pressure (BIPAP) mask. Wearing an oral device at night to improve breathing and reduce snoring. Taking  medicines. Follow these instructions at home: Take over-the-counter and prescription medicines only as told by your health care provider. Make any lifestyle changes that your health care provider recommends. If you are instructed to use a CPAP or BIPAP mask, make sure you use it nightly as directed. If you were given a device to open your airway while you sleep, use it only as told by your health care provider. Do not use any products that contain nicotine or tobacco. These products include cigarettes, chewing tobacco, and vaping devices, such as e-cigarettes. If you need help quitting, ask your health care provider. Summary A sleep study is a series of tests done while you are sleeping. It shows how well you sleep. Most sleep studies are done over one full night of sleep. You will arrive at the study center in the evening and go home in the morning. If you have signs of the sleep disorder called sleep apnea during your test, you may get a treatment mask to wear for the second half of the night. A health care provider who specializes in sleep will evaluate the results of your sleep study and share them with your primary health care provider. This information is not intended to replace advice given to you by your health care provider. Make sure you discuss any questions you have with your health care provider. Document Revised: 11/04/2021 Document Reviewed: 11/04/2021 Elsevier Patient Education  2024 [employer].

## 2023-12-09 DIAGNOSIS — R1032 Left lower quadrant pain: Secondary | ICD-10-CM | POA: Diagnosis not present

## 2023-12-13 ENCOUNTER — Ambulatory Visit: Payer: Medicare PPO | Admitting: Adult Health

## 2023-12-13 DIAGNOSIS — F419 Anxiety disorder, unspecified: Secondary | ICD-10-CM | POA: Diagnosis not present

## 2023-12-13 DIAGNOSIS — R1032 Left lower quadrant pain: Secondary | ICD-10-CM | POA: Diagnosis not present

## 2023-12-13 DIAGNOSIS — H3530 Unspecified macular degeneration: Secondary | ICD-10-CM | POA: Diagnosis not present

## 2023-12-16 DIAGNOSIS — M542 Cervicalgia: Secondary | ICD-10-CM | POA: Diagnosis not present

## 2023-12-16 DIAGNOSIS — R2681 Unsteadiness on feet: Secondary | ICD-10-CM | POA: Diagnosis not present

## 2023-12-16 DIAGNOSIS — H81399 Other peripheral vertigo, unspecified ear: Secondary | ICD-10-CM | POA: Diagnosis not present

## 2023-12-16 DIAGNOSIS — M25512 Pain in left shoulder: Secondary | ICD-10-CM | POA: Diagnosis not present

## 2023-12-19 DIAGNOSIS — H401112 Primary open-angle glaucoma, right eye, moderate stage: Secondary | ICD-10-CM | POA: Diagnosis not present

## 2023-12-19 DIAGNOSIS — T868411 Corneal transplant failure, right eye: Secondary | ICD-10-CM | POA: Diagnosis not present

## 2023-12-19 DIAGNOSIS — H401123 Primary open-angle glaucoma, left eye, severe stage: Secondary | ICD-10-CM | POA: Diagnosis not present

## 2023-12-20 ENCOUNTER — Encounter: Payer: Self-pay | Admitting: Adult Health

## 2023-12-20 ENCOUNTER — Other Ambulatory Visit: Payer: Self-pay | Admitting: Neurology

## 2023-12-20 ENCOUNTER — Telehealth: Payer: Self-pay | Admitting: Neurology

## 2023-12-20 ENCOUNTER — Ambulatory Visit: Admitting: Adult Health

## 2023-12-20 VITALS — BP 137/80 | HR 113

## 2023-12-20 DIAGNOSIS — G43719 Chronic migraine without aura, intractable, without status migrainosus: Secondary | ICD-10-CM | POA: Diagnosis not present

## 2023-12-20 DIAGNOSIS — R5382 Chronic fatigue, unspecified: Secondary | ICD-10-CM

## 2023-12-20 DIAGNOSIS — G4734 Idiopathic sleep related nonobstructive alveolar hypoventilation: Secondary | ICD-10-CM

## 2023-12-20 DIAGNOSIS — R519 Headache, unspecified: Secondary | ICD-10-CM

## 2023-12-20 MED ORDER — ONABOTULINUMTOXINA 100 UNITS IJ SOLR
155.0000 [IU] | Freq: Once | INTRAMUSCULAR | Status: AC
  Administered 2023-12-20: 140 [IU] via INTRAMUSCULAR

## 2023-12-20 NOTE — Telephone Encounter (Signed)
 NPSG [employer] pending

## 2023-12-20 NOTE — Progress Notes (Signed)
 Botox - 100 units x 2  vial Lot: [id number] Expiration: 11/2025 / 04/2026 NDC: [address]-29 x2   Bacteriostatic 0.9% Sodium Chloride - 4mL total [id number] Expiration: 01/15/25 NDC: 7846-9629-52  Dx: W41.324 BB  Witnessed by:  Arville Laughter

## 2023-12-20 NOTE — Progress Notes (Signed)
 Update 12/20/2023 JM: Patient returns for repeat Botox .  Prior injection 09/15/2023 with Dr. Tresia Fruit.  Notes continued benefit with Botox   with >50% reduction of migraines but still had daily tension type headaches and 12-14 days/month of migraines. Started Vyepti  with initial infusion about 1 month ago noting significant improvement of migraines, only occurs during changes of barometric pressure, she had about 7 headaches last month!  Use of Fioricet or sumatriptan  as needed, typically with benefit.  Tolerated procedure well today. Did inject frontalis hide near the hairline and did not inject procerus or corrugators due to difficulty with muscle droop, vision and dry eyes.  Return in 3 months for repeat injection.        Consent Form Botulism Toxin Injection For Chronic Migraine    Reviewed orally with patient, additionally signature is on file:  Botulism toxin has been approved by the Federal drug administration for treatment of chronic migraine. Botulism toxin does not cure chronic migraine and it may not be effective in some patients.  The administration of botulism toxin is accomplished by injecting a small amount of toxin into the muscles of the neck and head. Dosage must be titrated for each individual. Any benefits resulting from botulism toxin tend to wear off after 3 months with a repeat injection required if benefit is to be maintained. Injections are usually done every 3-4 months with maximum effect peak achieved by about 2 or 3 weeks. Botulism toxin is expensive and you should be sure of what costs you will incur resulting from the injection.  The side effects of botulism toxin use for chronic migraine may include:   -Transient, and usually mild, facial weakness with facial injections  -Transient, and usually mild, head or neck weakness with head/neck injections  -Reduction or loss of forehead facial animation due to forehead muscle weakness  -Eyelid drooping  -Dry  eye  -Pain at the site of injection or bruising at the site of injection  -Double vision  -Potential unknown long term risks   Contraindications: You should not have Botox  if you are pregnant, nursing, allergic to albumin, have an infection, skin condition, or muscle weakness at the site of the injection, or have myasthenia gravis, Lambert-Eaton syndrome, or ALS.  It is also possible that as with any injection, there may be an allergic reaction or no effect from the medication. Reduced effectiveness after repeated injections is sometimes seen and rarely infection at the injection site may occur. All care will be taken to prevent these side effects. If therapy is given over a long time, atrophy and wasting in the muscle injected may occur. Occasionally the patient's become refractory to treatment because they develop antibodies to the toxin. In this event, therapy needs to be modified.  I have read the above information and consent to the administration of botulism toxin.    BOTOX  PROCEDURE NOTE FOR MIGRAINE HEADACHE  Contraindications and precautions discussed with patient(above). Aseptic procedure was observed and patient tolerated procedure. Procedure performed by Johny Nap, AGNP-BC.   The condition has existed for more than 6 months, and pt does not have a diagnosis of ALS, Myasthenia Gravis or Lambert-Eaton Syndrome.  Risks and benefits of injections discussed and pt agrees to proceed with the procedure.  Written consent obtained  These injections are medically necessary. Pt  receives good benefits from these injections. These injections do not cause sedations or hallucinations which the oral therapies may cause.   Description of procedure:  The patient was  placed in a sitting position. The standard protocol was used for Botox  as follows, with 5 units of Botox  injected at each site:  -Frontalis muscle, bilateral injection, with 2 sites each side, medial injection was performed in  the upper one third of the frontalis muscle, in the region vertical from the medial inferior edge of the superior orbital rim. The lateral injection was again in the upper one third of the forehead vertically above the lateral limbus of the cornea, 1.5 cm lateral to the medial injection site.  -Temporalis muscle injection, 4 sites, bilaterally. The first injection was 3 cm above the tragus of the ear, second injection site was 1.5 cm to 3 cm up from the first injection site in line with the tragus of the ear. The third injection site was 1.5-3 cm forward between the first 2 injection sites. The fourth injection site was 1.5 cm posterior to the second injection site. 5th site laterally in the temporalis  muscleat the level of the outer canthus.  -Occipitalis muscle injection, 3 sites, bilaterally. The first injection was done one half way between the occipital protuberance and the tip of the mastoid process behind the ear. The second injection site was done lateral and superior to the first, 1 fingerbreadth from the first injection. The third injection site was 1 fingerbreadth superiorly and medially from the first injection site.  -Cervical paraspinal muscle injection, 2 sites, bilaterally. The first injection site was 1 cm from the midline of the cervical spine, 3 cm inferior to the lower border of the occipital protuberance. The second injection site was 1.5 cm superiorly and laterally to the first injection site.  -Trapezius muscle injection was performed at 3 sites, bilaterally. The first injection site was in the upper trapezius muscle halfway between the inflection point of the neck, and the acromion. The second injection site was one half way between the acromion and the first injection site. The third injection was done between the first injection site and the inflection point of the neck.    A total of 200 units of Botox  was prepared, 140 units of Botox  was injected as documented above, any  Botox  not injected was wasted. The patient tolerated the procedure well, there were no complications of the above procedure.   Johny Nap, AGNP-BC  [facility] [address] [place], [place] [address]  Phone [phone] Fax [phone] Note: This document was prepared with digital dictation and possible smart phrase technology. Any transcriptional errors that result from this process are unintentional.

## 2023-12-22 DIAGNOSIS — K219 Gastro-esophageal reflux disease without esophagitis: Secondary | ICD-10-CM | POA: Diagnosis not present

## 2023-12-22 DIAGNOSIS — R194 Change in bowel habit: Secondary | ICD-10-CM | POA: Diagnosis not present

## 2023-12-22 DIAGNOSIS — K5730 Diverticulosis of large intestine without perforation or abscess without bleeding: Secondary | ICD-10-CM | POA: Diagnosis not present

## 2023-12-23 DIAGNOSIS — M542 Cervicalgia: Secondary | ICD-10-CM | POA: Diagnosis not present

## 2023-12-23 DIAGNOSIS — H81399 Other peripheral vertigo, unspecified ear: Secondary | ICD-10-CM | POA: Diagnosis not present

## 2023-12-23 DIAGNOSIS — R2681 Unsteadiness on feet: Secondary | ICD-10-CM | POA: Diagnosis not present

## 2023-12-23 DIAGNOSIS — M25512 Pain in left shoulder: Secondary | ICD-10-CM | POA: Diagnosis not present

## 2023-12-26 NOTE — Telephone Encounter (Signed)
 Checked status on the portal it is still pending.

## 2023-12-27 DIAGNOSIS — M542 Cervicalgia: Secondary | ICD-10-CM | POA: Diagnosis not present

## 2023-12-27 DIAGNOSIS — H81399 Other peripheral vertigo, unspecified ear: Secondary | ICD-10-CM | POA: Diagnosis not present

## 2023-12-27 DIAGNOSIS — R2681 Unsteadiness on feet: Secondary | ICD-10-CM | POA: Diagnosis not present

## 2023-12-27 DIAGNOSIS — M25512 Pain in left shoulder: Secondary | ICD-10-CM | POA: Diagnosis not present

## 2023-12-29 NOTE — Addendum Note (Signed)
 Addended by: Elton Ham on: 12/29/2023 07:59 AM   Modules accepted: Orders

## 2023-12-29 NOTE — Telephone Encounter (Signed)
 Placed home sleep test for the pt

## 2023-12-29 NOTE — Telephone Encounter (Signed)
 HST [employer] no auth req via website NPSG denied

## 2023-12-29 NOTE — Telephone Encounter (Signed)
 [employer] is requesting for a peer to peer for the NPSG. Do you want to do a Peer to Peer or order a HST?

## 2023-12-30 DIAGNOSIS — M542 Cervicalgia: Secondary | ICD-10-CM | POA: Diagnosis not present

## 2023-12-30 DIAGNOSIS — R2681 Unsteadiness on feet: Secondary | ICD-10-CM | POA: Diagnosis not present

## 2023-12-30 DIAGNOSIS — H81399 Other peripheral vertigo, unspecified ear: Secondary | ICD-10-CM | POA: Diagnosis not present

## 2023-12-30 DIAGNOSIS — M25512 Pain in left shoulder: Secondary | ICD-10-CM | POA: Diagnosis not present

## 2023-12-31 ENCOUNTER — Ambulatory Visit
Admission: RE | Admit: 2023-12-31 | Discharge: 2023-12-31 | Disposition: A | Discharge status: Home / Self Care | Source: Ambulatory Visit | Attending: Neurology

## 2023-12-31 DIAGNOSIS — R5383 Other fatigue: Secondary | ICD-10-CM

## 2023-12-31 DIAGNOSIS — H5713 Ocular pain, bilateral: Secondary | ICD-10-CM

## 2023-12-31 DIAGNOSIS — R51 Headache with orthostatic component, not elsewhere classified: Secondary | ICD-10-CM

## 2023-12-31 DIAGNOSIS — G4734 Idiopathic sleep related nonobstructive alveolar hypoventilation: Secondary | ICD-10-CM | POA: Diagnosis not present

## 2023-12-31 DIAGNOSIS — H547 Unspecified visual loss: Secondary | ICD-10-CM

## 2023-12-31 DIAGNOSIS — R7309 Other abnormal glucose: Secondary | ICD-10-CM

## 2023-12-31 DIAGNOSIS — H53453 Other localized visual field defect, bilateral: Secondary | ICD-10-CM | POA: Diagnosis not present

## 2023-12-31 DIAGNOSIS — D649 Anemia, unspecified: Secondary | ICD-10-CM

## 2023-12-31 DIAGNOSIS — R519 Headache, unspecified: Secondary | ICD-10-CM

## 2023-12-31 MED ORDER — GADOPICLENOL 0.5 MMOL/ML IV SOLN
5.0000 mL | Freq: Once | INTRAVENOUS | Status: AC | PRN
  Administered 2023-12-31: 5 mL via INTRAVENOUS

## 2024-01-03 ENCOUNTER — Ambulatory Visit: Payer: Self-pay | Admitting: Neurology

## 2024-01-03 DIAGNOSIS — M542 Cervicalgia: Secondary | ICD-10-CM | POA: Diagnosis not present

## 2024-01-03 DIAGNOSIS — H81399 Other peripheral vertigo, unspecified ear: Secondary | ICD-10-CM | POA: Diagnosis not present

## 2024-01-03 DIAGNOSIS — M25512 Pain in left shoulder: Secondary | ICD-10-CM | POA: Diagnosis not present

## 2024-01-03 DIAGNOSIS — R2681 Unsteadiness on feet: Secondary | ICD-10-CM | POA: Diagnosis not present

## 2024-01-06 DIAGNOSIS — R2681 Unsteadiness on feet: Secondary | ICD-10-CM | POA: Diagnosis not present

## 2024-01-06 DIAGNOSIS — H81399 Other peripheral vertigo, unspecified ear: Secondary | ICD-10-CM | POA: Diagnosis not present

## 2024-01-06 DIAGNOSIS — M25512 Pain in left shoulder: Secondary | ICD-10-CM | POA: Diagnosis not present

## 2024-01-06 DIAGNOSIS — M542 Cervicalgia: Secondary | ICD-10-CM | POA: Diagnosis not present

## 2024-01-09 DIAGNOSIS — M542 Cervicalgia: Secondary | ICD-10-CM | POA: Diagnosis not present

## 2024-01-09 DIAGNOSIS — R2681 Unsteadiness on feet: Secondary | ICD-10-CM | POA: Diagnosis not present

## 2024-01-09 DIAGNOSIS — M25512 Pain in left shoulder: Secondary | ICD-10-CM | POA: Diagnosis not present

## 2024-01-09 DIAGNOSIS — H81399 Other peripheral vertigo, unspecified ear: Secondary | ICD-10-CM | POA: Diagnosis not present

## 2024-01-10 ENCOUNTER — Ambulatory Visit: Admitting: Neurology

## 2024-01-10 ENCOUNTER — Other Ambulatory Visit: Payer: Self-pay | Admitting: Cardiology

## 2024-01-10 DIAGNOSIS — G4734 Idiopathic sleep related nonobstructive alveolar hypoventilation: Secondary | ICD-10-CM

## 2024-01-10 DIAGNOSIS — R5382 Chronic fatigue, unspecified: Secondary | ICD-10-CM

## 2024-01-10 DIAGNOSIS — G4733 Obstructive sleep apnea (adult) (pediatric): Secondary | ICD-10-CM | POA: Diagnosis not present

## 2024-01-10 DIAGNOSIS — R519 Headache, unspecified: Secondary | ICD-10-CM

## 2024-01-11 ENCOUNTER — Encounter: Payer: Self-pay | Admitting: Neurology

## 2024-01-11 ENCOUNTER — Telehealth: Payer: Self-pay | Admitting: Neurology

## 2024-01-11 ENCOUNTER — Ambulatory Visit: Admitting: Neurology

## 2024-01-11 VITALS — BP 177/95 | HR 66 | Resp 16 | Ht 60.0 in

## 2024-01-11 DIAGNOSIS — Z5181 Encounter for therapeutic drug level monitoring: Secondary | ICD-10-CM

## 2024-01-11 DIAGNOSIS — G40209 Localization-related (focal) (partial) symptomatic epilepsy and epileptic syndromes with complex partial seizures, not intractable, without status epilepticus: Secondary | ICD-10-CM

## 2024-01-11 NOTE — Progress Notes (Signed)
 [facility]  [name] DOB: 08-30-1950  REQUESTING CLINICIAN: Gerome Brunet, DO HISTORY FROM: Patient  REASON FOR VISIT: Antiseizure medication management    HISTORICAL  CHIEF COMPLAINT:  Chief Complaint  Patient presents with   Migraine    Rm12, husband present, Migraine: 20/30 days. Triggers: weather, heat, foods.  Pt also has sz: last sz 30 yrs ago and would like to come off dilantin  due to gum growth    INTERVAL HISTORY 01/11/2024 Patient presents today for follow-up, she is accompanied by her husband.  Last visit was in April.  At that time we started lacosamide , and start decreasing her Dilantin .  Currently she is off Dilantin  and on lacosamide  100 mg twice daily.  She reports brain fog has decreased, she feels better, more awake, she does have some nausea when taking lacosamide  but with food no nausea.  Overall she is doing very well, no other complaints, no other concerns.   HISTORY OF PRESENT ILLNESS:  This is a 73 year old woman past medical history of epilepsy, since the age of 54, migraine headaches, hypertension, hyperlipidemia who is presenting for antiseizure medication management.  In terms of the epilepsy, she was diagnosed at the age of 72, and her last seizure was at the age of 89.  She is currently on Dilantin  300 mg daily but has tried topiramate , Depakote and phenobarbital in the past.  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  She is interested in switching Dilantin  to a newer antiseizure medication.  Again her last seizure was more than 35 years ago.   OTHER MEDICAL CONDITIONS: Epilepsy, Migraines, Hypertension, Hyperlipidemia    REVIEW OF SYSTEMS: Full 14 system review of systems performed and negative with exception of: As noted in the HPI   ALLERGIES: Allergies  Allergen Reactions   Morphine Other (See Comments)    Difficulty breathing   Morphine And  Codeine Shortness Of Breath   Alphagan [Brimonidine]     Eyelid swelling, scratching Allergic to preservative contained in this med   Amlodipine  Other (See Comments)    Causes my gums to grow up on my teeth.  Have had to have them cut back twice   Codeine Nausea And Vomiting   Dorzolamide Hcl-Timolol Mal     Inflammation of the eyelid, scratching Allergic to preservative contained in this med   Hydrocodone-Acetaminophen  Nausea And Vomiting    But tolerates tylenol    Sulfa Antibiotics Rash    Large bumps   Sulfamethoxazole Rash    HOME MEDICATIONS: Outpatient Medications Prior to Visit  Medication Sig Dispense Refill   ascorbic Acid (VITAMIN C) 500 MG CPCR Take 500 mg by mouth daily.     aspirin  325 MG tablet Take 325 mg by mouth daily.     botulinum toxin Type A  (BOTOX ) 100 units SOLR injection Inject IM into the head and neck muscles every 3 months by provider in office 2 vial 3   butalbital -acetaminophen -caffeine  (FIORICET, ESGIC) 50-325-40 MG tablet Take 1 tablet by mouth every 6 (six) hours as needed for headache. 10 tablet 4   Calcium  Carbonate-Vit D-Min (CALCIUM  1200 PO) Take 2 capsules by mouth as needed.     chlorthalidone  (HYGROTON ) 25 MG tablet TAKE 1 TABLET(25 MG) BY MOUTH DAILY 90 tablet 3   Cholecalciferol (VITAMIN D  PO) Take 1,000 Units by mouth daily.      Difluprednate  0.05 % EMUL Place 1 drop into the left eye in the  morning, at noon, in the evening, and at bedtime.     Eptinezumab -jjmr (VYEPTI ) 100 MG/ML injection Inject 1 mL (100 mg total) into the vein every 3 (three) months. 1.12 mL 4   Evolocumab  (REPATHA  SURECLICK) 140 MG/ML SOAJ Inject 140 mg into the skin every 14 (fourteen) days. 6 mL 1   ezetimibe  (ZETIA ) 10 MG tablet Take 1 tablet (10 mg total) by mouth daily. 90 tablet 0   FORTEO  600 MCG/2.4ML SOPN INJECT UNDER THE SKIN ONCE DAILY. DISCARD PEN AFTER 28 DAYS 9.6 mL 0   HUMIRA PEN 40 MG/0.4ML PNKT Inject 40 mg into the skin every 14 (fourteen) days.  As directed     Insulin  Pen Needle 31G X 5 MM MISC Use to inject Forteo  once daily. DO NOT REUSE. Forteo  received through patient assistance 100 each 2   Lacosamide  100 MG TABS Take 1 tablet (100 mg total) by mouth in the morning and at bedtime. 60 tablet 5   leflunomide (ARAVA) 20 MG tablet Take 20 mg by mouth daily.     LORazepam  (ATIVAN ) 0.5 MG tablet Take 0.5 mg by mouth daily as needed for anxiety or sleep.     losartan  (COZAAR ) 100 MG tablet TAKE 1 TABLET(100 MG) BY MOUTH DAILY 90 tablet 3   pantoprazole  (PROTONIX ) 40 MG tablet TAKE 1 TABLET(40 MG) BY MOUTH DAILY 90 tablet 3   promethazine  (PHENERGAN ) 25 MG tablet Take 1 tablet (25 mg total) by mouth every 6 (six) hours as needed for nausea or vomiting. 30 tablet 11   RESTASIS  0.05 % ophthalmic emulsion 2 (two) times daily.     rosuvastatin  (CRESTOR ) 20 MG tablet Take 1 tablet (20 mg total) by mouth daily. 30 tablet 10   spironolactone  (ALDACTONE ) 25 MG tablet Take 1 tablet (25 mg total) by mouth daily. 90 tablet 3   SUMAtriptan  (IMITREX ) 6 MG/0.5ML SOLN injection Inject 0.5 mLs (6 mg total) into the skin every 2 (two) hours as needed for migraine or headache. Take one dose at headache onset, can take additional dose 2hrs later if needed. No more then 2 injections in 24hrs (each kit contains 2 injections) 6 mL 11   tacrolimus  (PROGRAF ) 1 MG capsule Take 1 mg by mouth daily.     phenytoin  (DILANTIN ) 100 MG ER capsule TAKE 2 CAPSULES(200 MG) BY MOUTH DAILY 60 capsule 1   Facility-Administered Medications Prior to Visit  Medication Dose Route Frequency Provider Last Rate Last Admin   0.9 %  sodium chloride  infusion   Intravenous PRN Sebastian Lamarr SAUNDERS, PA-C        PAST MEDICAL HISTORY: Past Medical History:  Diagnosis Date   Arthritis    in the neck   Carotid artery occlusion    Epilepsy (HCC)    GERD (gastroesophageal reflux disease)    Glaucoma    Hyperlipidemia    Hypertension    Keratoconus of both eyes 1981   Macular  degeneration    Migraines    Peripheral vascular disease (HCC)    carotid occlusion surgery on left   Raynaud's disease    Retinal edema    Rheumatoid arthritis(714.0)    Stroke (HCC)    two mild strokes presumed from left carotid stenosis    PAST SURGICAL HISTORY: Past Surgical History:  Procedure Laterality Date   CAROTID ARTERY ANGIOPLASTY  2008   Dr Dyane   CAROTID ENDARTERECTOMY Left 11-15-2007   cataract extraction Right 04-2014   [facility]   CHOLECYSTECTOMY  1995  CORNEAL TRANSPLANT     x 5 ; steroid inj. 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   [facility]   CORNEAL TRANSPLANT Left 04/17/2019   EYE SURGERY Right 12/2016   cornea repair    EYE SURGERY Right 09/20/2017   cornea transplant    GLAUCOMA SURGERY Right 2018   ORIF PATELLA Left 02/12/2021   Procedure: OPEN REDUCTION INTERNAL (ORIF) FIXATION PATELLA;  Surgeon: Sharl Selinda Dover, MD;  Location: [facility] OR;  Service: Orthopedics;  Laterality: Left;   TRIGGER FINGER RELEASE Right 05/16/2018   Procedure: RELEASE TRIGGER FINGER/A-1 PULLEY RIGHT THUMB;  Surgeon: Murrell Kuba, MD;  Location:  SURGERY CENTER;  Service: Orthopedics;  Laterality: Right;    FAMILY HISTORY: Family History  Problem Relation Age of Onset   Heart disease Mother    Lung disease Mother        ? disease process   Uterine cancer Mother    Heart disease Father    Clotting disorder Father    Collagen disease Father    Cervical cancer Maternal Aunt    Prostate cancer Maternal Grandfather    Rheum arthritis Sister    High Cholesterol Sister    Epilepsy Sister    Rheum arthritis Maternal Grandmother    High Cholesterol Brother    High blood pressure Brother    High blood pressure Brother    High Cholesterol Brother    High blood pressure Brother    High Cholesterol Brother     SOCIAL HISTORY: Social History   Socioeconomic History   Marital status: Married    Spouse name: Ryan   Number of  children: 2   Years of education: Bachelor   Highest education level: Bachelor's degree (e.g., BA, AB, BS)  Occupational History   Occupation: Runner, broadcasting/film/video   Occupation: AG TEACHER    Employer: [employer]    Comment: retired  Tobacco Use   Smoking status: Former    Current packs/day: 0.00    Average packs/day: 0.5 packs/day for 14.0 years (7.0 ttl pk-yrs)    Types: Cigarettes    Start date: 74    Quit date: 1989    Years since quitting: 36.5    Passive exposure: Never   Smokeless tobacco: Never  Vaping Use   Vaping status: Never Used  Substance and Sexual Activity   Alcohol use: No   Drug use: No   Sexual activity: Not on file  Other Topics Concern   Not on file  Social History Narrative   Patient is married to Artie), has 2 children   Patient is right handed   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   Caffeine  consumption is about 2 cups daily   Lives at home with husband    Social Drivers of Corporate investment banker Strain: Not on file  Food Insecurity: Not on file  Transportation Needs: Not on file  Physical Activity: Not on file  Stress: Not on file  Social Connections: Not on file  Intimate Partner Violence: Not on file    PHYSICAL EXAM  GENERAL EXAM/CONSTITUTIONAL: Vitals:  Vitals:   01/11/24 1514 01/11/24 1519  BP: (!) 177/93 (!) 177/95  Pulse: 66   Resp: 16   Height: 5' (1.524 m)     Body mass index is 22.65 kg/m. Wt Readings from Last 3 Encounters:  12/07/23 116 lb (52.6 kg)  12/06/23 116 lb 9.6 oz (52.9 kg)  12/02/23 115 lb (52.2 kg)   Patient is in no distress;  well developed, nourished and groomed; neck is supple  MUSCULOSKELETAL: Gait, strength, tone, movements noted in Neurologic exam below  NEUROLOGIC: MENTAL STATUS:      No data to display         awake, alert, oriented to person, place and time recent and remote memory intact normal attention and concentration language fluent, comprehension intact, naming  intact fund of knowledge appropriate  CRANIAL NERVE:  2nd, 3rd, 4th, 6th - Visual fields full to confrontation, extraocular muscles intact, no nystagmus 5th - facial sensation symmetric 7th - facial strength symmetric 8th - hearing intact 9th - palate elevates symmetrically, uvula midline 11th - shoulder shrug symmetric 12th - tongue protrusion midline  MOTOR:  normal bulk and tone, full strength in the BUE, BLE  SENSORY:  normal and symmetric to light touch  GAIT/STATION:  With husband help, due to decrease vision    DIAGNOSTIC DATA (LABS, IMAGING, TESTING) - I reviewed patient records, labs, notes, testing and imaging myself where available.  Lab Results  Component Value Date   WBC 10.5 10/05/2023   HGB 11.8 10/05/2023   HCT 37.3 10/05/2023   MCV 84.8 10/05/2023   PLT 295 10/05/2023      Component Value Date/Time   NA 138 10/05/2023 1023   NA 140 03/18/2021 0914   K 5.1 10/05/2023 1023   CL 99 10/05/2023 1023   CO2 32 10/05/2023 1023   GLUCOSE 104 (H) 10/05/2023 1023   BUN 12 10/05/2023 1023   BUN 11 03/18/2021 0914   CREATININE 0.68 10/05/2023 1023   CALCIUM  9.4 10/05/2023 1023   PROT 7.2 10/05/2023 1023   PROT 7.0 03/18/2021 0914   ALBUMIN 4.3 03/18/2021 0914   AST 29 10/05/2023 1023   ALT 22 10/05/2023 1023   ALKPHOS 104 03/18/2021 0914   BILITOT 0.3 10/05/2023 1023   BILITOT 0.2 03/18/2021 0914   GFRNONAA 79 08/19/2023 0853   GFRNONAA 95 12/13/2018 1352   GFRAA 107 05/26/2020 1607   GFRAA 110 12/13/2018 1352   Lab Results  Component Value Date   CHOL 140 03/18/2021   HDL 71 03/18/2021   LDLCALC 49 03/18/2021   TRIG 113 03/18/2021   CHOLHDL 2.0 03/18/2021   Lab Results  Component Value Date   HGBA1C 5.7 (H) 10/06/2023   Lab Results  Component Value Date   VITAMINB12 399 10/06/2023   Lab Results  Component Value Date   TSH 0.897 10/06/2023    ASSESSMENT AND PLAN  73 y.o. year old female with longstanding history of epilepsy, last  seizure more than 35 years presenting for follow-up, at last visit we discontinued Dilantin  due to side effect and started patient on lacosamide .  Currently she is on lacosamide  100 mg twice daily, denies any seizure and no major side effect.  Reported her brain fog and sleepiness has improved.  Will check lacosamide  level, I will obtain routine EEG while patient is on lacosamide  and contact her with results.  If EEG is normal, then she will continue to follow with Dr. Ines for management of her migraines.    1. Partial symptomatic epilepsy with complex partial seizures, not intractable, without status epilepticus (HCC)   2. Therapeutic drug monitoring      Patient Instructions  Discontinue phenytoin  Continue with lacosamide  100 mg twice daily Will check lacosamide  level with vitamin D  Routine EEG, I will contact you to go over the result Continue follow with Dr. Ines for management of migraines  Orders Placed This Encounter  Procedures  Lacosamide    Basic Metabolic Panel   Vitamin D , 25-hydroxy   EEG adult    No orders of the defined types were placed in this encounter.   No follow-ups on file.    Pastor Falling, MD 01/11/2024, 4:05 PM  [facility] [address] [place], [place] [address] [phone]

## 2024-01-11 NOTE — Patient Instructions (Signed)
 Discontinue phenytoin  Continue with lacosamide  100 mg twice daily Will check lacosamide  level with vitamin D  Routine EEG, I will contact you to go over the result Continue follow with Dr. Ines for management of migraines

## 2024-01-11 NOTE — Telephone Encounter (Signed)
 Pt in office for appt with Dr. Gregg. States she started getting infusion for migraines the first month it was great, end of second month they started coming bad very bad. Pt currently in a lot of pain and unable to see. Wanted to make Dr. Ines aware.

## 2024-01-11 NOTE — Progress Notes (Signed)
 [facility]  Brianna [name] 73 year old female 05-Jul-1951   HOME SLEEP TEST REPORT ( by Watch PAT)   STUDY DATE: 01-10-2024    ORDERING CLINICIAN: Dedra Gores, MD  REFERRING CLINICIAN: Dr Ines , MD    CLINICAL INFORMATION/HISTORY: I have the pleasure of seeing [name] 12/07/23 a right -handed female:  Fatigue, sleepiness , snoring, fragmented sleep,  Insomnia, Nocturia ,Headache. Brianna [name] is a 73 y.o. female patient who is seen upon Dr Sharion referral on 12/07/2023 . Chief concern according to patient :   I had an abnormal ONO but a daytime oximetry was normal  I have chronic migraine, wake up with  dull headaches, rarely woken by headaches. Those are  located within the right eye  or temple- stabbing, shooting, very severe and sudden- creating an apprehension of sleep. Movement, light and sound and smell can all trigger a migraine.   Sleep relevant medical history: Nocturia 2-3 times, night terrors, but rarely now - sleep paralysis, Tonsillectomy and adenoid ectome, CEA surgery, cervical spine DDD,  smoker in the past. No shift work history, macular degeneration and glaucoma.   Dx :1) sleep related morning headaches and cluster/  migraine headaches.  Can last 3 days in a row.  Chronic migraines with weather being a trigger, some foods, smells, sounds , lights and motion.    2) former smoker with an abnormal ONO.  3) history  of RF positive rheumatoid arthritis, with dry eyes and dry mouth- SICCA ? Likes to et ICE - Pica? Iron deficiency related - I reviewed her labs- she is severely deficient of iron.    3) severely vision impaired patient who is sharing the bed with her pets.    Epworth sleepiness score: 5/ 24 points  FSS endorsed at 27/ 63 points.  GDS : 6/ 15 - anxiety about vision.      BMI: kg/m 22.7   Neck Circumference: 14' , overbite , small jaw,    FINDINGS:   Sleep Summary:   Total Recording Time (hours, min): 9  hours 1 minutes      Total Sleep Time (hours, min):    8 hours 8 minutes             Percent REM (%): 24%                                      Respiratory Indices:   Calculated pAHI (CMS guideline): 3.0/h                         REM pAHI:   9.1/h                                              NREM pAHI:   1.9/h                           Positional AHI:   This patient had the highest AHI when sleeping in prone position 5.6/h Snoring:    Reached a mean volume of 41 dB which is just above threshold snoring was only present for about 11.2% of the total sleep time.  Oxygen Saturation Statistics:   Oxygen Saturation (%) Mean: 94%               O2 Saturation Range (%): Between a nadir at 84 and a maximum saturation of 98%                                     O2 Saturation (minutes) <89%: 0 minutes         Pulse Rate Statistics:   Pulse Mean (bpm): 74 bpm               Pulse Range:    Between 58 and 95 bpm             IMPRESSION:  This HST did not detect sleep apnea when applying CMS criteria.  However if this patient would have not been retired and not scored on the CMS criteria her AHI would have been 11.6/h her REM AHI 24.4/h and she would have been diagnosed with mild sleep apnea.  This patient did not have significant hypoxia, she does not have bradycardia there is minimal snoring and based on this home sleep test not enough apnea to call for any treatment.    RECOMMENDATION: No follow up in sleep clinic needed.   Improving sleep hygiene, sleep routines and to supplement this patient low iron stores.  INTERPRETING PHYSICIAN:  Dedra Gores, MD  [facility] Sleep Board certified by The [employer] of Sleep Medicine and Diplomate of the [employer] of Sleep Medicine. Board certified In Neurology through the ABPN, Fellow of the [employer] of Neu[employer].

## 2024-01-12 NOTE — Telephone Encounter (Addendum)
 I called pt and she was better today.  She has had one infusion vyepti  100mg  about 1-1.5 months ago 11/16/2023. She said that 1st month was great.  She has noted that her headaches increased to 3-4 /week then yesterday woke up with one that was terrible.  She noted that her eyesight worse, (she has 20/300 both eyes, glaucoma, macula degen and multiple corneal transplants. Her Bp was up as well yesterday.  [facility] RN told her if her if breakthru that may speak to provider about increasing dose of vyepti .  She has botox  in 03-20-2024, no appt with MD.  She wanted me to let MD know, (saw Dr. Ines in passing yesterday).  Her first vyepti  was 11-16-2023 100mg .  I told her will pass on information and see if she wants to increase or see how she does.

## 2024-01-13 ENCOUNTER — Other Ambulatory Visit: Payer: Self-pay

## 2024-01-13 DIAGNOSIS — M542 Cervicalgia: Secondary | ICD-10-CM | POA: Diagnosis not present

## 2024-01-13 DIAGNOSIS — R2681 Unsteadiness on feet: Secondary | ICD-10-CM | POA: Diagnosis not present

## 2024-01-13 DIAGNOSIS — H81399 Other peripheral vertigo, unspecified ear: Secondary | ICD-10-CM | POA: Diagnosis not present

## 2024-01-13 DIAGNOSIS — M25512 Pain in left shoulder: Secondary | ICD-10-CM | POA: Diagnosis not present

## 2024-01-13 MED ORDER — CHLORTHALIDONE 25 MG PO TABS: 25.0000 mg | ORAL_TABLET | Freq: Every day | ORAL | 0 refills | Status: DC

## 2024-01-14 LAB — BASIC METABOLIC PANEL WITH GFR
BUN/Creatinine Ratio: 19 (ref 12–28)
BUN: 26 mg/dL (ref 8–27)
CO2: 24 mmol/L (ref 20–29)
Calcium: 9.3 mg/dL (ref 8.7–10.3)
Chloride: 97 mmol/L (ref 96–106)
Creatinine, Ser: 1.4 mg/dL — ABNORMAL HIGH (ref 0.57–1.00)
Glucose: 89 mg/dL (ref 70–99)
Potassium: 4.4 mmol/L (ref 3.5–5.2)
Sodium: 139 mmol/L (ref 134–144)
eGFR: 40 mL/min/{1.73_m2} — ABNORMAL LOW (ref >59)

## 2024-01-14 LAB — LACOSAMIDE: Lacosamide: 6.4 ug/mL (ref 5.0–10.0)

## 2024-01-14 LAB — VITAMIN D 25 HYDROXY (VIT D DEFICIENCY, FRACTURES): Vit D, 25-Hydroxy: 53.7 ng/mL (ref 30.0–100.0)

## 2024-01-16 ENCOUNTER — Ambulatory Visit: Payer: Self-pay | Admitting: Neurology

## 2024-01-16 DIAGNOSIS — R2681 Unsteadiness on feet: Secondary | ICD-10-CM | POA: Diagnosis not present

## 2024-01-16 DIAGNOSIS — M25512 Pain in left shoulder: Secondary | ICD-10-CM | POA: Diagnosis not present

## 2024-01-16 DIAGNOSIS — M542 Cervicalgia: Secondary | ICD-10-CM | POA: Diagnosis not present

## 2024-01-16 DIAGNOSIS — H81399 Other peripheral vertigo, unspecified ear: Secondary | ICD-10-CM | POA: Diagnosis not present

## 2024-01-16 NOTE — Telephone Encounter (Signed)
 Pt has returned call to [facility]

## 2024-01-16 NOTE — Telephone Encounter (Signed)
 Lvm 1st attempt by hf

## 2024-01-18 HISTORY — PX: CORNEAL TRANSPLANT: SHX108

## 2024-01-23 DIAGNOSIS — N289 Disorder of kidney and ureter, unspecified: Secondary | ICD-10-CM | POA: Diagnosis not present

## 2024-01-23 NOTE — Telephone Encounter (Signed)
 I addended her note thanks

## 2024-01-23 NOTE — Telephone Encounter (Signed)
 Order for vyepti  300mg  IV every 3 month for 1 year placed for signature (Dr. Ines).  Then will give to [facility].

## 2024-01-24 DIAGNOSIS — N289 Disorder of kidney and ureter, unspecified: Secondary | ICD-10-CM | POA: Diagnosis not present

## 2024-01-24 NOTE — Telephone Encounter (Signed)
 Signed Vyepti  300 mg order given to [facility].

## 2024-01-25 DIAGNOSIS — H179 Unspecified corneal scar and opacity: Secondary | ICD-10-CM | POA: Diagnosis not present

## 2024-01-25 DIAGNOSIS — H1820 Unspecified corneal edema: Secondary | ICD-10-CM | POA: Diagnosis not present

## 2024-01-25 DIAGNOSIS — T868411 Corneal transplant failure, right eye: Secondary | ICD-10-CM | POA: Diagnosis not present

## 2024-01-25 HISTORY — PX: CORNEAL TRANSPLANT: SHX108

## 2024-01-26 ENCOUNTER — Ambulatory Visit: Payer: Self-pay | Admitting: Neurology

## 2024-01-26 DIAGNOSIS — Z947 Corneal transplant status: Secondary | ICD-10-CM | POA: Diagnosis not present

## 2024-01-26 NOTE — Procedures (Signed)
 [facility]  Brianna [name] 73 year old female 05-Jul-1951   HOME SLEEP TEST REPORT ( by Watch PAT)   STUDY DATE: 01-10-2024    ORDERING CLINICIAN: Dedra Gores, MD  REFERRING CLINICIAN: Dr Ines , MD    CLINICAL INFORMATION/HISTORY: I have the pleasure of seeing Brianna [name] 12/07/23 a right -handed female:  Fatigue, sleepiness , snoring, fragmented sleep,  Insomnia, Nocturia ,Headache. Brianna [name] is a 73 y.o. female patient who is seen upon Dr Sharion referral on 12/07/2023 . Chief concern according to patient :   I had an abnormal ONO but a daytime oximetry was normal  I have chronic migraine, wake up with  dull headaches, rarely woken by headaches. Those are  located within the right eye  or temple- stabbing, shooting, very severe and sudden- creating an apprehension of sleep. Movement, light and sound and smell can all trigger a migraine.   Sleep relevant medical history: Nocturia 2-3 times, night terrors, but rarely now - sleep paralysis, Tonsillectomy and adenoid ectome, CEA surgery, cervical spine DDD,  smoker in the past. No shift work history, macular degeneration and glaucoma.   Dx :1) sleep related morning headaches and cluster/  migraine headaches.  Can last 3 days in a row.  Chronic migraines with weather being a trigger, some foods, smells, sounds , lights and motion.    2) former smoker with an abnormal ONO.  3) history  of RF positive rheumatoid arthritis, with dry eyes and dry mouth- SICCA ? Likes to et ICE - Pica? Iron deficiency related - I reviewed her labs- she is severely deficient of iron.    3) severely vision impaired patient who is sharing the bed with her pets.    Epworth sleepiness score: 5/ 24 points  FSS endorsed at 27/ 63 points.  GDS : 6/ 15 - anxiety about vision.      BMI: kg/m 22.7   Neck Circumference: 14' , overbite , small jaw,    FINDINGS:   Sleep Summary:   Total Recording Time (hours, min): 9  hours 1 minutes      Total Sleep Time (hours, min):    8 hours 8 minutes             Percent REM (%): 24%                                      Respiratory Indices:   Calculated pAHI (CMS guideline): 3.0/h                         REM pAHI:   9.1/h                                              NREM pAHI:   1.9/h                           Positional AHI:   This patient had the highest AHI when sleeping in prone position 5.6/h Snoring:    Reached a mean volume of 41 dB which is just above threshold snoring was only present for about 11.2% of the total sleep time.  Oxygen Saturation Statistics:   Oxygen Saturation (%) Mean: 94%               O2 Saturation Range (%): Between a nadir at 84 and a maximum saturation of 98%                                     O2 Saturation (minutes) <89%: 0 minutes         Pulse Rate Statistics:   Pulse Mean (bpm): 74 bpm               Pulse Range:    Between 58 and 95 bpm             IMPRESSION:  This HST did not detect sleep apnea when applying CMS criteria.  However if this patient would have not been retired and not scored on the CMS criteria her AHI would have been 11.6/h her REM AHI 24.4/h and she would have been diagnosed with mild sleep apnea.  This patient did not have significant hypoxia, she does not have bradycardia there is minimal snoring and based on this home sleep test not enough apnea to call for any treatment.    RECOMMENDATION: No follow up in sleep clinic needed.   Improving sleep hygiene, sleep routines and to supplement this patient low iron stores.  INTERPRETING PHYSICIAN:  Dedra Gores, MD  [facility] Sleep Board certified by The [employer] of Sleep Medicine and Diplomate of the [employer] Sleep Medicine. Board certified In Neurology through the ABPN, Fellow of the [employer].

## 2024-01-30 DIAGNOSIS — L812 Freckles: Secondary | ICD-10-CM | POA: Diagnosis not present

## 2024-01-30 DIAGNOSIS — L820 Inflamed seborrheic keratosis: Secondary | ICD-10-CM | POA: Diagnosis not present

## 2024-01-30 DIAGNOSIS — L853 Xerosis cutis: Secondary | ICD-10-CM | POA: Diagnosis not present

## 2024-01-30 DIAGNOSIS — L821 Other seborrheic keratosis: Secondary | ICD-10-CM | POA: Diagnosis not present

## 2024-02-02 DIAGNOSIS — H30033 Focal chorioretinal inflammation, peripheral, bilateral: Secondary | ICD-10-CM | POA: Diagnosis not present

## 2024-02-02 DIAGNOSIS — H353221 Exudative age-related macular degeneration, left eye, with active choroidal neovascularization: Secondary | ICD-10-CM | POA: Diagnosis not present

## 2024-02-02 DIAGNOSIS — Z947 Corneal transplant status: Secondary | ICD-10-CM | POA: Diagnosis not present

## 2024-02-02 DIAGNOSIS — H401123 Primary open-angle glaucoma, left eye, severe stage: Secondary | ICD-10-CM | POA: Diagnosis not present

## 2024-02-02 DIAGNOSIS — H3581 Retinal edema: Secondary | ICD-10-CM | POA: Diagnosis not present

## 2024-02-02 DIAGNOSIS — Z961 Presence of intraocular lens: Secondary | ICD-10-CM | POA: Diagnosis not present

## 2024-02-02 DIAGNOSIS — Z79899 Other long term (current) drug therapy: Secondary | ICD-10-CM | POA: Diagnosis not present

## 2024-02-02 DIAGNOSIS — H401112 Primary open-angle glaucoma, right eye, moderate stage: Secondary | ICD-10-CM | POA: Diagnosis not present

## 2024-02-06 ENCOUNTER — Ambulatory Visit: Attending: Cardiology | Admitting: Cardiology

## 2024-02-07 DIAGNOSIS — H938X9 Other specified disorders of ear, unspecified ear: Secondary | ICD-10-CM | POA: Diagnosis not present

## 2024-02-09 DIAGNOSIS — H401112 Primary open-angle glaucoma, right eye, moderate stage: Secondary | ICD-10-CM | POA: Diagnosis not present

## 2024-02-09 DIAGNOSIS — H401123 Primary open-angle glaucoma, left eye, severe stage: Secondary | ICD-10-CM | POA: Diagnosis not present

## 2024-02-16 DIAGNOSIS — H401123 Primary open-angle glaucoma, left eye, severe stage: Secondary | ICD-10-CM | POA: Diagnosis not present

## 2024-02-16 DIAGNOSIS — H30033 Focal chorioretinal inflammation, peripheral, bilateral: Secondary | ICD-10-CM | POA: Diagnosis not present

## 2024-02-16 DIAGNOSIS — Z947 Corneal transplant status: Secondary | ICD-10-CM | POA: Diagnosis not present

## 2024-02-16 DIAGNOSIS — H353221 Exudative age-related macular degeneration, left eye, with active choroidal neovascularization: Secondary | ICD-10-CM | POA: Diagnosis not present

## 2024-02-16 DIAGNOSIS — H401112 Primary open-angle glaucoma, right eye, moderate stage: Secondary | ICD-10-CM | POA: Diagnosis not present

## 2024-02-16 DIAGNOSIS — Z961 Presence of intraocular lens: Secondary | ICD-10-CM | POA: Diagnosis not present

## 2024-02-16 DIAGNOSIS — Z79899 Other long term (current) drug therapy: Secondary | ICD-10-CM | POA: Diagnosis not present

## 2024-02-16 DIAGNOSIS — H3581 Retinal edema: Secondary | ICD-10-CM | POA: Diagnosis not present

## 2024-02-21 DIAGNOSIS — Z1231 Encounter for screening mammogram for malignant neoplasm of breast: Secondary | ICD-10-CM | POA: Diagnosis not present

## 2024-02-21 DIAGNOSIS — M816 Localized osteoporosis [Lequesne]: Secondary | ICD-10-CM | POA: Diagnosis not present

## 2024-02-21 DIAGNOSIS — Z01419 Encounter for gynecological examination (general) (routine) without abnormal findings: Secondary | ICD-10-CM | POA: Diagnosis not present

## 2024-02-21 DIAGNOSIS — Z6822 Body mass index (BMI) 22.0-22.9, adult: Secondary | ICD-10-CM | POA: Diagnosis not present

## 2024-02-21 DIAGNOSIS — N958 Other specified menopausal and perimenopausal disorders: Secondary | ICD-10-CM | POA: Diagnosis not present

## 2024-02-21 LAB — HM DEXA SCAN

## 2024-02-23 ENCOUNTER — Telehealth: Payer: Self-pay | Admitting: *Deleted

## 2024-02-23 NOTE — Progress Notes (Signed)
 Office Visit Note  [name]             Date of Birth: May 06, 1951           MRN: [id number]             PCP: Gerome Brunet, DO Referring: Gerome Brunet, DO Visit Date: 03/07/2024 Occupation: @GUAROCC @  Subjective:  Medication management  History of Present Illness: Brianna [name] is a 73 y.o. female with seropositive rheumatoid arthritis, ILD, and iritis returns today after her last visit in March 2025.  She has been on combination of Humira 40 mg subcu every 14 days and leflunomide 20 mg p.o. daily prescribed by Dr. Loreli.  She continues to have some stiffness in her hands and have difficulty with fine motor movements like buttoning her shirt.  Of the other joints are painful.  She has not had any flares of iritis.  She has some shortness of breath during the winter months.  She denies any palpitations.    Activities of Daily Living:  Patient reports morning stiffness for 15 minutes.   Patient Denies nocturnal pain.  Difficulty dressing/grooming: Denies Difficulty climbing stairs: Denies Difficulty getting out of chair: Denies Difficulty using hands for taps, buttons, cutlery, and/or writing: Reports  Review of Systems  Constitutional:  Positive for fatigue.  HENT:  Positive for mouth dryness. Negative for mouth sores.   Eyes:  Positive for dryness.  Respiratory:  Positive for shortness of breath.   Cardiovascular:  Negative for chest pain and palpitations.  Gastrointestinal:  Negative for blood in stool, constipation and diarrhea.  Endocrine: Negative for increased urination.  Genitourinary:  Negative for involuntary urination.  Musculoskeletal:  Positive for gait problem and morning stiffness. Negative for joint pain, joint pain, joint swelling, myalgias, muscle weakness, muscle tenderness and myalgias.  Skin:  Positive for sensitivity to sunlight. Negative for color change, rash and hair loss.  Allergic/Immunologic: Negative for susceptible to infections.   Neurological:  Positive for headaches. Negative for dizziness.  Hematological:  Negative for swollen glands.  Psychiatric/Behavioral:  Negative for depressed mood and sleep disturbance. The patient is nervous/anxious.     PMFS History:  Patient Active Problem List   Diagnosis Date Noted   Iron deficiency 12/07/2023   Other fatigue 12/07/2023   Nocturnal hypoxemia 12/07/2023   Gastroesophageal reflux disease without esophagitis 03/24/2022   Altered taste 03/24/2022   Primary osteoarthritis of first carpometacarpal joint of left hand 12/18/2021   Coronary artery disease involving native coronary artery of native heart without angina pectoris 11/23/2021   Rhinitis, chronic 09/23/2021   Closed fracture of left patella 06/22/2021   Low back pain 05/28/2021   Displacement of lumbar intervertebral disc without myelopathy 05/28/2021   Episodic cluster headache, not intractable 03/24/2021   Morning headache 03/24/2021   Cerebrovascular accident (CVA) (HCC) 03/24/2021   Chronic migraine without aura, intractable, without status migrainosus 03/24/2021   Patellar fracture 02/11/2021   Dyslipidemia 02/11/2021   Pain of left thumb 02/02/2021   Anxiety 08/28/2020   Carotid artery disease (HCC) 08/28/2020   Late effects of cerebrovascular disease 08/28/2020   Macular degeneration 08/28/2020   Other long term (current) drug therapy 08/28/2020   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   Pure hypercholesterolemia 08/28/2020   Vitamin D  deficiency 08/28/2020   Exertional chest pain 06/24/2020   Hypertension 05/12/2020   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   Acute urinary tract infection 11/13/2019   Epilepsy (HCC) 11/13/2019   Corneal ulcer of  left eye 12/05/2018   Acute left eye pain 12/05/2018   Endophthalmitis, acute, left 12/05/2018   Ulcer of left cornea 12/05/2018   URI, acute 05/19/2018   Cerumen impaction 05/19/2018   Primary open angle glaucoma (POAG) of left  eye, severe stage 02/16/2018   Laceration of left middle finger without foreign body without damage to nail 11/23/2017   Trigger thumb, right thumb 10/05/2017   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   Numbness 04/25/2017   Pain 04/25/2017   Primary open angle glaucoma (POAG) of right eye, moderate stage 04/15/2017   Bilateral impacted cerumen 01/31/2017   History of macular degeneration 09/13/2016   History of migraine 09/06/2016   History of seizures 09/06/2016   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   Secondary corneal edema of right eye 06/28/2016   Lumbar radiculopathy 05/24/2016   Degeneration of lumbosacral intervertebral disc 05/24/2016   Iritis of right eye 05/12/2016   Rheumatoid arthritis with positive rheumatoid factor (HCC) 05/11/2016   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   High risk medication use 05/11/2016   Osteoporosis 05/11/2016   ILD (interstitial lung disease) (HCC) 05/11/2016   Intractable chronic migraine without aura and without status migrainosus 04/02/2016   Intractable chronic migraine without aura 04/02/2016   Peripheral focal chorioretinal inflammation of both eyes 01/04/2016   Squamous blepharitis of upper and lower eyelids of both eyes 10/22/2015   Dry eyes, bilateral 10/21/2015   History of high risk medication treatment 04/28/2015   Cystoid macular edema of left eye 09/23/2014   History of carotid endarterectomy 06/27/2014   Acquired myogenic ptosis of both eyelids 06/07/2014   Myogenic ptosis, acquired 06/07/2014   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   Migraine without aura 04/19/2013   Seizures (HCC) 04/19/2013   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   Keratoconus, 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   Minor corneal opacity 08/02/2011   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   Cough 01/14/2011   Pulmonary fibrosis (HCC) 01/14/2011    Past Medical History:  Diagnosis Date   Arthritis    in the neck   Carotid artery occlusion    Epilepsy (HCC)    GERD (gastroesophageal  reflux disease)    Glaucoma    Hyperlipidemia    Hypertension    Keratoconus of both eyes 1981   Macular degeneration    Migraines    Peripheral vascular disease (HCC)    carotid occlusion surgery on left   Raynaud's disease    Retinal edema    Rheumatoid arthritis(714.0)    Stroke (HCC)    two mild strokes presumed from left carotid stenosis    Family History  Problem Relation Age of Onset   Heart disease Mother    Lung disease Mother        ? disease process   Uterine cancer Mother    Heart disease Father    Clotting disorder Father    Collagen disease Father    Cervical cancer Maternal Aunt    Prostate cancer Maternal Grandfather    Rheum arthritis Sister    High Cholesterol Sister    Epilepsy Sister    Rheum arthritis Maternal Grandmother    High Cholesterol Brother    High blood pressure Brother    High blood pressure Brother    High Cholesterol Brother    High blood pressure Brother    High Cholesterol Brother    Past Surgical History:  Procedure Laterality Date   CAROTID ARTERY ANGIOPLASTY  2008   Dr Dyane   CAROTID ENDARTERECTOMY Left 11/15/2007  cataract extraction Right 04/2014   [facility]   CHOLECYSTECTOMY  1995   CORNEAL TRANSPLANT     x 5 ; steroid inj. 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   [facility]   CORNEAL TRANSPLANT Left 04/17/2019   CORNEAL TRANSPLANT Right 01/18/2024   EYE SURGERY Right 12/2016   cornea repair    EYE SURGERY Right 09/20/2017   cornea transplant    GLAUCOMA SURGERY Right 2018   ORIF PATELLA Left 02/12/2021   Procedure: OPEN REDUCTION INTERNAL (ORIF) FIXATION PATELLA;  Surgeon: Sharl Selinda Dover, MD;  Location: [facility] OR;  Service: Orthopedics;  Laterality: Left;   TRIGGER FINGER RELEASE Right 05/16/2018   Procedure: RELEASE TRIGGER FINGER/A-1 PULLEY RIGHT THUMB;  Surgeon: Murrell Kuba, MD;  Location: [facility];  Service: Orthopedics;  Laterality: Right;   Social History   Social  History Narrative   Patient is married to Artie), has 2 children   Patient is right handed   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   Caffeine  consumption is about 2 cups daily   Lives at home with husband    Immunization History  Administered Date(s) Administered   Fluad Quad(high Dose 65+) 05/14/2022   Influenza Split 04/26/2013, 04/08/2017   Influenza, High Dose Seasonal PF 04/09/2018   Influenza, Quadrivalent, Recombinant, Inj, Pf 04/21/2017, 03/19/2019   Influenza-Unspecified 04/19/2016, 10/29/2020   PFIZER(Purple Top)SARS-COV-2 Vaccination 08/24/2019, 09/18/2019, 05/12/2020, 10/29/2020   Pneumococcal Conjugate-13 08/27/2013, 07/05/2017   Pneumococcal Polysaccharide-23 01/14/2011   Tdap 11/15/2017   Unspecified SARS-COV-2 Vaccination 10/29/2020   Zoster Recombinant(Shingrix) 07/19/2019, 11/28/2019     Objective: Vital Signs: BP 127/79 (BP Location: Left Arm, Patient Position: Sitting, Cuff Size: Normal)   Pulse 80   Ht 5' 0.5 (1.537 m)   Wt 117 lb (53.1 kg)   BMI 22.47 kg/m    Physical Exam Vitals and nursing note reviewed.  Constitutional:      Appearance: She is well-developed.  HENT:     Head: Normocephalic and atraumatic.  Eyes:     Conjunctiva/sclera: Conjunctivae normal.  Cardiovascular:     Rate and Rhythm: Normal rate and regular rhythm.     Heart sounds: Normal heart sounds.  Pulmonary:     Effort: Pulmonary effort is normal.     Breath sounds: Normal breath sounds.  Abdominal:     General: Bowel sounds are normal.     Palpations: Abdomen is soft.  Musculoskeletal:     Cervical back: Normal range of motion.  Lymphadenopathy:     Cervical: No cervical adenopathy.  Skin:    General: Skin is warm and dry.     Capillary Refill: Capillary refill takes less than 2 seconds.  Neurological:     Mental Status: She is alert and oriented to person, place, and time.  Psychiatric:        Behavior: Behavior normal.      Musculoskeletal Exam: She had  limited range of motion without discomfort.  She had limited range of motion of the lumbar spine.  She had good range of motion of her shoulders, elbows and wrist joints.  MCP thickening with no synovitis was noted.  PIP and DIP thickening was noted.  Hip joints and knee joints in good range of motion.  No warmth swelling or effusion was noted.  There was no tenderness over ankles or MTPs.  CDAI Exam: CDAI Score: -- Patient Global: --; Provider Global: -- Swollen: --; Tender: -- Joint Exam 03/07/2024   No joint exam has  been documented for this visit   There is currently no information documented on the homunculus. Go to the Rheumatology activity and complete the homunculus joint exam.  Investigation: No additional findings.  Imaging: No results found.  Recent Labs: Lab Results  Component Value Date   WBC 10.5 10/05/2023   HGB 11.8 10/05/2023   PLT 295 10/05/2023   NA 139 01/11/2024   K 4.4 01/11/2024   CL 97 01/11/2024   CO2 24 01/11/2024   GLUCOSE 89 01/11/2024   BUN 26 01/11/2024   CREATININE 1.40 (H) 01/11/2024   BILITOT 0.3 10/05/2023   ALKPHOS 104 03/18/2021   AST 29 10/05/2023   ALT 22 10/05/2023   PROT 7.2 10/05/2023   ALBUMIN 4.3 03/18/2021   CALCIUM  9.3 01/11/2024   GFRAA 107 05/26/2020   QFTBGOLDPLUS NEGATIVE 08/12/2022   January 11, 2024 creatinine was elevated at 1.40.  Vitamin D  was normal at 53.7.  TB Gold was negative on May 16, 2023.    Speciality Comments: Last Reclast  11/30/2017, 12/06/2023 Forteo  completed July 2024 TB Gold May 16, 2023  Procedures:  No procedures performed Allergies: Morphine, Morphine and codeine, Alphagan [brimonidine], Amlodipine , Codeine, Dorzolamide hcl-timolol mal, Hydrocodone-acetaminophen , Sulfa antibiotics, and Sulfamethoxazole   Assessment / Plan:     Visit Diagnoses: Rheumatoid arthritis involving multiple sites with positive rheumatoid factor (HCC)-rheumatoid arthritis appears to be well-controlled on current  regimen.  She had no synovitis on the examination.  ILD (interstitial lung disease) (HCC) - Dr. Darlean.  Previous chest x-ray 09/22/2021 stable fibrotic changes.  No acute infiltrate.  Pulmonary function testing performed on 05/20/2022.  High risk medication use - Arava 20 mg 1 tablet by mouth daily and Humira 40 mg sq every 14 days prescribed by Dr. Maree. -CBC and CMP were normal in March 2025.  28 2024.  Recent creatinine was 1.4 on January 11, 2024.  Patient related to diuretic use.  She has changed medications now.  Will recheck labs today.  Plan: CBC with Differential/Platelet, Comprehensive metabolic panel with GFR  Elevated serum creatinine - Most likely due to use of diuretics.  Iritis of right eye - Followed by Dr.Shah.  Patient was evaluated by ophthalmology on February 16, 2024.  Her eye disease has been quiet..  Trochanteric bursitis, right hip-resolved.  DDD (degenerative disc disease), cervical -she had limited range of motion without discomfort.  10/21/2022 diffuse severe degeneration w/ abnormal vertebral alignmentmultilevel spinal stenosis, stable from MRI in March 2024.Congenital incomplete C1 ring.  Degeneration of intervertebral disc of lumbar region without discogenic back pain or lower extremity pain - Under care of Dr. Joshua.  She continues to have some discomfort.  Age-related osteoporosis without current pathological fracture - DEXA on 02/09/2022:spine normal, LFNTscore -3.2,RFN T score -2.8, forearm T score -3.4.Completed Forteo  July 2024.Previous tx.Reclast --last infusion May 2019.[facility], 20[facility]: T-score -3.8, BMD 0.489 left one third forearm, right femoral neck -2.6, left femoral neck -2.8, lumbar spine percent change 16%, right femoral neck percent change 5.8% left femoral neck percent change 5.2%.  DEXA scan results were discussed with the patient.  Plan to repeat Reclast  infusion in May 2026.  She will go on a drug holiday for 2 years after  that.  Other medical problems listed as follows:  Primary hypertension - She is on amlodipine  prescribed by cardiologist.  Mixed hyperlipidemia - She is on Crestor , Repatha  and Zetia  combination by cardiology. - Plan: Lipid panel  Balance problem  History of seizures - On  lacosamide , followed by neurology.  History of migraine - Dr. Ines.  History of carotid endarterectomy  History of glaucoma - under care of Dr. Elester and Dr. Almond Dr. Veronda office visit note from 10/01/2023: Pred x 4 OD, Durezol  x4 Os, timolol x1 OU AM, restasis  x2 OU.  History of macular degeneration  Orders: Orders Placed This Encounter  Procedures   CBC with Differential/Platelet   Comprehensive metabolic panel with GFR   Lipid panel   No orders of the defined types were placed in this encounter.    Follow-Up Instructions: Return in about 5 months (around 08/07/2024) for Rheumatoid arthritis.   Maya Nash, MD  Note - This record has been created using Animal nutritionist.  Chart creation errors have been sought, but may not always  have been located. Such creation errors do not reflect on  the standard of medical care.

## 2024-02-23 NOTE — Telephone Encounter (Signed)
 Patient contacted the office stating she could not remember the name of her osteoporosis infusion. Patient advised it is Reclast .

## 2024-02-27 ENCOUNTER — Other Ambulatory Visit: Admitting: *Deleted

## 2024-02-27 ENCOUNTER — Encounter: Payer: Self-pay | Admitting: *Deleted

## 2024-02-27 DIAGNOSIS — R7989 Other specified abnormal findings of blood chemistry: Secondary | ICD-10-CM | POA: Diagnosis not present

## 2024-02-29 DIAGNOSIS — M542 Cervicalgia: Secondary | ICD-10-CM | POA: Diagnosis not present

## 2024-02-29 DIAGNOSIS — H81399 Other peripheral vertigo, unspecified ear: Secondary | ICD-10-CM | POA: Diagnosis not present

## 2024-02-29 DIAGNOSIS — M25512 Pain in left shoulder: Secondary | ICD-10-CM | POA: Diagnosis not present

## 2024-02-29 DIAGNOSIS — R2681 Unsteadiness on feet: Secondary | ICD-10-CM | POA: Diagnosis not present

## 2024-03-02 ENCOUNTER — Telehealth: Payer: Self-pay

## 2024-03-02 DIAGNOSIS — M25512 Pain in left shoulder: Secondary | ICD-10-CM | POA: Diagnosis not present

## 2024-03-02 DIAGNOSIS — H81399 Other peripheral vertigo, unspecified ear: Secondary | ICD-10-CM | POA: Diagnosis not present

## 2024-03-02 DIAGNOSIS — R2681 Unsteadiness on feet: Secondary | ICD-10-CM | POA: Diagnosis not present

## 2024-03-02 DIAGNOSIS — M542 Cervicalgia: Secondary | ICD-10-CM | POA: Diagnosis not present

## 2024-03-02 NOTE — Telephone Encounter (Signed)
 Received DEXA results from [facility].   Date of Scan: 02/21/2024  Lowest T-score: -3.8  BMD: 0.489  Lowest site measured:left 1/2 forearm  DX: osteoporosis   Significant changes in BMD and site measured (5% and above):n/a  Current Regimen:Reclast  IV   Recommendation: repeat Reclast  IV May 2026  Reviewed by: Dr. Dolphus   Next Appointment:  03/07/2024  Results will be discussed with patient in detail at the follow up visit scheduled on 03/07/2024.

## 2024-03-05 DIAGNOSIS — H401112 Primary open-angle glaucoma, right eye, moderate stage: Secondary | ICD-10-CM | POA: Diagnosis not present

## 2024-03-05 DIAGNOSIS — H401123 Primary open-angle glaucoma, left eye, severe stage: Secondary | ICD-10-CM | POA: Diagnosis not present

## 2024-03-06 ENCOUNTER — Ambulatory Visit: Attending: Nurse Practitioner | Admitting: Nurse Practitioner

## 2024-03-06 ENCOUNTER — Encounter: Payer: Self-pay | Admitting: Nurse Practitioner

## 2024-03-06 VITALS — BP 134/88 | HR 74 | Ht 60.5 in | Wt 117.0 lb

## 2024-03-06 DIAGNOSIS — I6523 Occlusion and stenosis of bilateral carotid arteries: Secondary | ICD-10-CM | POA: Diagnosis not present

## 2024-03-06 DIAGNOSIS — I6522 Occlusion and stenosis of left carotid artery: Secondary | ICD-10-CM | POA: Diagnosis not present

## 2024-03-06 DIAGNOSIS — I700 Atherosclerosis of aorta: Secondary | ICD-10-CM | POA: Diagnosis not present

## 2024-03-06 DIAGNOSIS — I2510 Atherosclerotic heart disease of native coronary artery without angina pectoris: Secondary | ICD-10-CM

## 2024-03-06 DIAGNOSIS — E785 Hyperlipidemia, unspecified: Secondary | ICD-10-CM

## 2024-03-06 DIAGNOSIS — I10 Essential (primary) hypertension: Secondary | ICD-10-CM | POA: Diagnosis not present

## 2024-03-06 MED ORDER — REPATHA SURECLICK 140 MG/ML ~~LOC~~ SOAJ: 140.0000 mg | SUBCUTANEOUS | 0 refills | Status: DC

## 2024-03-06 MED ORDER — CHLORTHALIDONE 25 MG PO TABS
25.0000 mg | ORAL_TABLET | Freq: Every day | ORAL | 1 refills | Status: AC
Start: 2024-03-06 — End: ?

## 2024-03-06 MED ORDER — LOSARTAN POTASSIUM 100 MG PO TABS: 100.0000 mg | ORAL_TABLET | Freq: Every day | ORAL | 1 refills | Status: DC

## 2024-03-06 MED ORDER — EZETIMIBE 10 MG PO TABS: 10.0000 mg | ORAL_TABLET | Freq: Every day | ORAL | 1 refills | Status: AC

## 2024-03-06 MED ORDER — AMLODIPINE BESYLATE 5 MG PO TABS: 5.0000 mg | ORAL_TABLET | Freq: Every day | ORAL | 1 refills | Status: AC

## 2024-03-06 MED ORDER — ROSUVASTATIN CALCIUM 20 MG PO TABS: 20.0000 mg | ORAL_TABLET | Freq: Every day | ORAL | 2 refills | Status: AC

## 2024-03-06 NOTE — Progress Notes (Signed)
 Cardiology Office Note    Patient Name: Brianna [name] Date of Encounter: 03/06/2024  Primary Care Provider:  Gerome Brunet, DO Primary Cardiologist:  Oneil Parchment, MD Primary Electrophysiologist: None   Past Medical History    Past Medical History:  Diagnosis Date   Arthritis    in the neck   Carotid artery occlusion    Epilepsy (HCC)    GERD (gastroesophageal reflux disease)    Glaucoma    Hyperlipidemia    Hypertension    Keratoconus of both eyes 1981   Macular degeneration    Migraines    Peripheral vascular disease (HCC)    carotid occlusion surgery on left   Raynaud's disease    Retinal edema    Rheumatoid arthritis(714.0)    Stroke (HCC)    two mild strokes presumed from left carotid stenosis    History of Present Illness  Brianna [name] is a 73 y.o. female with a PMH of HTN, HLD, CVA, left carotid enterectomy in 2009 due to prior right sided stroke coronary calcifications, Raynaud's disease, rheumatoid arthritis, aortic atherosclerosis who presents today for annual follow-up.  [name] was seen initially in 2021 for management of hypertension.  She was noted to have previous three-vessel coronary calcification and was managed on aggressive medical therapy with statin therapy and PCSK9.  She completed a 2D echo at that time that showed normal ejection fraction with moderate LVH due to hypertensive heart disease and no significant valve abnormalities.  She had also completed a CT high-resolution of the chest that showed aortic atherosclerosis with coronary calcifications as well as of parenchymal fibrosis.  She is currently followed by pulmonologist for management. She was previously followed by Dr. Harvey for management of carotid artery disease and underwent enterectomy on 11/15/2007 had previously suffered 2 TIAs expressive aphasia and right hemiparesis monocular vision loss in 2006.  Her most recent carotid duplex was 11/24/2023 that showed left 1-39% ICA stenosis right  1-39% ICA stenosis.  She was last seen by Dr. Parchment on 01/18/2023 for annual follow-up.  During visit she reported sometimes experiencing some mild shortness of breath but denied any chest pain.  She was continued on aggressive goal-directed medical therapy and was tolerating Repatha  and Zetia  along with Crestor .  Brianna [name] presents today with her husband for annual follow-up.  She has been doing well from a cardiac perspective but does express concerns regarding her kidney function and elevated blood pressure. She has experienced elevated blood pressure since before March, leading to an increase in her spironolactone  dosage from half to a whole tablet. Her home blood pressure readings vary, sometimes reaching 150/80, and were higher before her cornea surgery on July 2nd. In March, she began experiencing kidney problems, with high creatinine levels noted by her gynecologist, neurologist, and sleep apnea doctor. Her creatinine was 1.3 in March and decreased to 1.2 by July. She suspects spironolactone  might be contributing to her kidney issues. She acknowledges needing to improve her water intake, which she believes might be affecting her kidney function. She denies any history of kidney disease but notes that her kidney filtration rate was 43. She reports recent onset of back pain on one side, starting this past Sunday, described as feeling like 'something's kicked me in my back'. She has been on various medications including spironolactone , chlorthalidone , and a new medication for epilepsy after switching from Dilantin  due to gum overgrowth issues. She has a history of thalassemia, which causes her to feel cold frequently. No significant  swelling in her ankles and no progression of shortness of breath, which she experiences more in winter. She reports a feeling of incomplete bladder emptying and increased frequency of urination. Patient denies chest pain, palpitations, dyspnea, PND, orthopnea, nausea, vomiting,  dizziness, syncope, edema, weight gain, or early satiety.  Discussed the use of AI scribe software for clinical note transcription with the patient, who gave verbal consent to proceed.  History of Present Illness   Review of Systems  Please see the history of present illness.    All other systems reviewed and are otherwise negative except as noted above.  Physical Exam    Wt Readings from Last 3 Encounters:  12/07/23 116 lb (52.6 kg)  12/06/23 116 lb 9.6 oz (52.9 kg)  12/02/23 115 lb (52.2 kg)   CD:Uyzmz were no vitals filed for this visit.,There is no height or weight on file to calculate BMI. GEN: Well nourished, well developed in no acute distress Neck: No JVD; No carotid bruits Pulmonary: Clear to auscultation without rales, wheezing or rhonchi  Cardiovascular: Normal rate. Regular rhythm. Normal S1. Normal S2.   Murmurs: There is no murmur.  ABDOMEN: Soft, non-tender, non-distended EXTREMITIES:  No edema; No deformity   EKG/LABS/ Recent Cardiac Studies   ECG personally reviewed by me today -sinus rhythm with rate of 74 bpm and no acute changes consistent with previous EKG.  Risk Assessment/Calculations:          Lab Results  Component Value Date   WBC 10.5 10/05/2023   HGB 11.8 10/05/2023   HCT 37.3 10/05/2023   MCV 84.8 10/05/2023   PLT 295 10/05/2023   Lab Results  Component Value Date   CREATININE 1.40 (H) 01/11/2024   BUN 26 01/11/2024   NA 139 01/11/2024   K 4.4 01/11/2024   CL 97 01/11/2024   CO2 24 01/11/2024   Lab Results  Component Value Date   CHOL 140 03/18/2021   HDL 71 03/18/2021   LDLCALC 49 03/18/2021   TRIG 113 03/18/2021   CHOLHDL 2.0 03/18/2021    Lab Results  Component Value Date   HGBA1C 5.7 (H) 10/06/2023   Assessment & Plan    Assessment & Plan   1.  Coronary calcifications: -Coronary artery disease well-managed with controlled cholesterol levels. On Repatha  and Zetia  with improved cholesterol numbers. No recent  progression of symptoms. EKG normal. - Continue current medications: Repatha , Zetia , and Crestor .  2.  History of carotid artery disease: -s/p left carotid endarterectomy 2009 with most recent carotid Doppler completed 11/2023 showing stable 1-39% stenosis bilaterally  3.  Essential hypertension: - Patient's blood pressure today was patient's blood pressure today was initially elevated at 140/88 and was 138/88 on recheck. -Discussed switching to amlodipine  to avoid renal impact. She is willing to retry amlodipine  despite previous gingival hyperplasia. - Discontinue spironolactone . - Initiate amlodipine  5 mg daily. - Monitor blood pressure twice daily, morning and evening, with two readings one minute apart. - Maintain blood pressure log and send results via MyChart in two weeks. - Encourage dietary modifications, including the DASH diet and reduced salt intake. - Reassess blood pressure control and adjust amlodipine  dosage if necessary.  4.  Hyperlipidemia: - Patient's last LDL cholesterol was 39 on 8/70/7974 - Continue Repatha  140 mg, ezetimibe  10 mg and Crestor  20 mg   5.  Chronic kidney disease: Recent creatinine levels show slight decline in renal function. Spironolactone  and chlorthalidone  may impact renal function. Discussed importance of hydration and potential medication adjustments. -  Encourage increased water intake to at least 64 ounces daily. - Recheck kidney function in 2-3 weeks. - Consider nephrology referral if renal function does not improve after medication adjustment and increased hydration.     Disposition: Follow-up with Oneil Parchment, MD or APP in 6 months    Signed, Wyn Raddle, Jackee Shove, NP 03/06/2024, 11:43 AM [facility]

## 2024-03-06 NOTE — Patient Instructions (Addendum)
 Medication Instructions:  START Norvasc  5mg  Take 1 tablet once a day  STOP Spironolactone   *If you need a refill on your cardiac medications before your next appointment, please call your pharmacy*  Lab Work: None ordered If you have labs (blood work) drawn today and your tests are completely normal, you will receive your results only by: MyChart Message (if you have MyChart) OR A paper copy in the mail If you have any lab test that is abnormal or we need to change your treatment, we will call you to review the results.  Testing/Procedures: None ordered  Follow-Up: At [facility], you and your health needs are our priority.  As part of our continuing mission to provide you with exceptional heart care, our providers are all part of one team.  This team includes your primary Cardiologist (physician) and Advanced Practice Providers or APPs (Physician Assistants and Nurse Practitioners) who all work together to provide you with the care you need, when you need it.  Your next appointment:   6 month(s)  Provider:   Oneil Parchment, MD    We recommend signing up for the patient portal called MyChart.  Sign up information is provided on this After Visit Summary.  MyChart is used to connect with patients for Virtual Visits (Telemedicine).  Patients are able to view lab/test results, encounter notes, upcoming appointments, etc.  Non-urgent messages can be sent to your provider as well.   To learn more about what you can do with MyChart, go to [email or web address].   Other Instructions Stay hydrated and drink at least 64 ounces of water a day   Check your blood pressure daily for 2 weeks, then contact the office with your readings.  Contact the office either by phone or MyChart with your readings.  Make sure to check your blood pressure 2 hours after taking your medications.   AVOID these things for 30 minutes before checking your blood pressure: No Drinking caffeine . No Drinking  alcohol. No Eating. No Smoking. No Exercising.  Five minutes before checking your blood pressure: Pee. Sit in a dining chair. Avoid sitting in a soft couch or armchair. Be quiet. Do not talk.

## 2024-03-07 ENCOUNTER — Ambulatory Visit: Attending: Rheumatology | Admitting: Rheumatology

## 2024-03-07 ENCOUNTER — Encounter: Payer: Self-pay | Admitting: Rheumatology

## 2024-03-07 VITALS — BP 127/79 | HR 80 | Ht 60.5 in | Wt 117.0 lb

## 2024-03-07 DIAGNOSIS — M5030 Other cervical disc degeneration, unspecified cervical region: Secondary | ICD-10-CM

## 2024-03-07 DIAGNOSIS — H209 Unspecified iridocyclitis: Secondary | ICD-10-CM | POA: Diagnosis not present

## 2024-03-07 DIAGNOSIS — R2689 Other abnormalities of gait and mobility: Secondary | ICD-10-CM

## 2024-03-07 DIAGNOSIS — M51369 Other intervertebral disc degeneration, lumbar region without mention of lumbar back pain or lower extremity pain: Secondary | ICD-10-CM

## 2024-03-07 DIAGNOSIS — Z79899 Other long term (current) drug therapy: Secondary | ICD-10-CM | POA: Diagnosis not present

## 2024-03-07 DIAGNOSIS — Z9889 Other specified postprocedural states: Secondary | ICD-10-CM

## 2024-03-07 DIAGNOSIS — H81399 Other peripheral vertigo, unspecified ear: Secondary | ICD-10-CM | POA: Diagnosis not present

## 2024-03-07 DIAGNOSIS — M0579 Rheumatoid arthritis with rheumatoid factor of multiple sites without organ or systems involvement: Secondary | ICD-10-CM

## 2024-03-07 DIAGNOSIS — E782 Mixed hyperlipidemia: Secondary | ICD-10-CM | POA: Diagnosis not present

## 2024-03-07 DIAGNOSIS — M810 Age-related osteoporosis without current pathological fracture: Secondary | ICD-10-CM | POA: Diagnosis not present

## 2024-03-07 DIAGNOSIS — J849 Interstitial pulmonary disease, unspecified: Secondary | ICD-10-CM | POA: Diagnosis not present

## 2024-03-07 DIAGNOSIS — R2681 Unsteadiness on feet: Secondary | ICD-10-CM | POA: Diagnosis not present

## 2024-03-07 DIAGNOSIS — M542 Cervicalgia: Secondary | ICD-10-CM | POA: Diagnosis not present

## 2024-03-07 DIAGNOSIS — I10 Essential (primary) hypertension: Secondary | ICD-10-CM | POA: Diagnosis not present

## 2024-03-07 DIAGNOSIS — Z8669 Personal history of other diseases of the nervous system and sense organs: Secondary | ICD-10-CM

## 2024-03-07 DIAGNOSIS — R7989 Other specified abnormal findings of blood chemistry: Secondary | ICD-10-CM

## 2024-03-07 DIAGNOSIS — Z87898 Personal history of other specified conditions: Secondary | ICD-10-CM

## 2024-03-07 DIAGNOSIS — M25512 Pain in left shoulder: Secondary | ICD-10-CM | POA: Diagnosis not present

## 2024-03-07 DIAGNOSIS — M7061 Trochanteric bursitis, right hip: Secondary | ICD-10-CM

## 2024-03-07 NOTE — Patient Instructions (Signed)
 Standing Labs We placed an order today for your standing lab work.   Please have your standing labs drawn in November and every 3 months  Please have your labs drawn 2 weeks prior to your appointment so that the provider can discuss your lab results at your appointment, if possible.  Please note that you may see your imaging and lab results in MyChart before we have reviewed them. We will contact you once all results are reviewed. Please allow our office up to 72 hours to thoroughly review all of the results before contacting the office for clarification of your results.  WALK-IN LAB HOURS  Monday through Thursday from 8:00 am -12:30 pm and 1:00 pm-4:30 pm and Friday from 8:00 am-12:00 pm.  Patients with office visits requiring labs will be seen before walk-in labs.  You may encounter longer than normal wait times. Please allow additional time. Wait times may be shorter on  Monday and Thursday afternoons.  We do not book appointments for walk-in labs. We appreciate your patience and understanding with our staff.   Labs are drawn by [facility]. Please bring your co-pay at the time of your lab draw.  You may receive a bill from [facility] for your lab work.  Please note if you are on Hydroxychloroquine and and an order has been placed for a Hydroxychloroquine level,  you will need to have it drawn 4 hours or more after your last dose.  If you wish to have your labs drawn at another location, please call the office 24 hours in advance so we can fax the orders.  The office is located at [address], [address], [place], [place] [address]   If you have any questions regarding directions or hours of operation,  please call [phone].   As a reminder, please drink plenty of water prior to coming for your lab work. Thanks!   Vaccines You are taking a medication(s) that can suppress your immune system.  The following immunizations are recommended: Flu annually Covid-19  Td/Tdap (tetanus,  diphtheria, pertussis) every 10 years Pneumonia (Prevnar 15 then Pneumovax 23 at least 1 year apart.  Alternatively, can take Prevnar 20 without needing additional dose) Shingrix: 2 doses from 4 weeks to 6 months apart  Please check with your PCP to make sure you are up to date. If you have signs or symptoms of an infection or start antibiotics: First, call your PCP for workup of your infection. Hold your medication through the infection, until you complete your antibiotics, and until symptoms resolve if you take the following: Injectable medication (Actemra, Benlysta, Cimzia, Cosentyx, Enbrel, Humira, Kevzara, Orencia, Remicade, Simponi, Stelara, Taltz, Tremfya) Methotrexate Leflunomide (Arava) Mycophenolate (Cellcept) Earma Jewel, or Rinvoq  Annual skin examination to screen for skin cancer was advised while she is on Humira.  Use of sunscreen and sun protection was discussed.

## 2024-03-08 ENCOUNTER — Ambulatory Visit: Payer: Self-pay | Admitting: Rheumatology

## 2024-03-08 DIAGNOSIS — H401123 Primary open-angle glaucoma, left eye, severe stage: Secondary | ICD-10-CM | POA: Diagnosis not present

## 2024-03-08 DIAGNOSIS — H3581 Retinal edema: Secondary | ICD-10-CM | POA: Diagnosis not present

## 2024-03-08 DIAGNOSIS — H353221 Exudative age-related macular degeneration, left eye, with active choroidal neovascularization: Secondary | ICD-10-CM | POA: Diagnosis not present

## 2024-03-08 DIAGNOSIS — Z947 Corneal transplant status: Secondary | ICD-10-CM | POA: Diagnosis not present

## 2024-03-08 DIAGNOSIS — H30033 Focal chorioretinal inflammation, peripheral, bilateral: Secondary | ICD-10-CM | POA: Diagnosis not present

## 2024-03-08 DIAGNOSIS — H401112 Primary open-angle glaucoma, right eye, moderate stage: Secondary | ICD-10-CM | POA: Diagnosis not present

## 2024-03-08 DIAGNOSIS — Z961 Presence of intraocular lens: Secondary | ICD-10-CM | POA: Diagnosis not present

## 2024-03-08 DIAGNOSIS — Z79899 Other long term (current) drug therapy: Secondary | ICD-10-CM | POA: Diagnosis not present

## 2024-03-08 LAB — LIPID PANEL
Cholesterol: 126 mg/dL (ref <200)
HDL: 79 mg/dL (ref >=50)
LDL Cholesterol (Calc): 25 mg/dL
Non-HDL Cholesterol (Calc): 47 mg/dL (ref <130)
Total CHOL/HDL Ratio: 1.6 (calc) (ref <5.0)
Triglycerides: 140 mg/dL (ref <150)

## 2024-03-08 LAB — CBC WITH DIFFERENTIAL/PLATELET
Absolute Lymphocytes: 1993 {cells}/uL (ref 850–3900)
Absolute Monocytes: 1074 {cells}/uL — ABNORMAL HIGH (ref 200–950)
Basophils Absolute: 64 {cells}/uL (ref 0–200)
Basophils Relative: 0.7 %
Eosinophils Absolute: 364 {cells}/uL (ref 15–500)
Eosinophils Relative: 4 %
HCT: 37.5 % (ref 35.0–45.0)
Hemoglobin: 12.1 g/dL (ref 11.7–15.5)
MCH: 31.3 pg (ref 27.0–33.0)
MCHC: 32.3 g/dL (ref 32.0–36.0)
MCV: 96.9 fL (ref 80.0–100.0)
MPV: 11.3 fL (ref 7.5–12.5)
Monocytes Relative: 11.8 %
Neutro Abs: 5606 {cells}/uL (ref 1500–7800)
Neutrophils Relative %: 61.6 %
Platelets: 244 Thousand/uL (ref 140–400)
RBC: 3.87 Million/uL (ref 3.80–5.10)
RDW: 16.5 % — ABNORMAL HIGH (ref 11.0–15.0)
Total Lymphocyte: 21.9 %
WBC: 9.1 Thousand/uL (ref 3.8–10.8)

## 2024-03-08 LAB — COMPREHENSIVE METABOLIC PANEL WITH GFR
AG Ratio: 1.4 (calc) (ref 1.0–2.5)
ALT: 25 U/L (ref 6–29)
AST: 36 U/L — ABNORMAL HIGH (ref 10–35)
Albumin: 4.4 g/dL (ref 3.6–5.1)
Alkaline phosphatase (APISO): 68 U/L (ref 37–153)
BUN/Creatinine Ratio: 17 (calc) (ref 6–22)
BUN: 21 mg/dL (ref 7–25)
CO2: 26 mmol/L (ref 20–32)
Calcium: 9.6 mg/dL (ref 8.6–10.4)
Chloride: 102 mmol/L (ref 98–110)
Creat: 1.24 mg/dL — ABNORMAL HIGH (ref 0.60–1.00)
Globulin: 3.1 g/dL (ref 1.9–3.7)
Glucose, Bld: 137 mg/dL — ABNORMAL HIGH (ref 65–99)
Potassium: 5.1 mmol/L (ref 3.5–5.3)
Sodium: 136 mmol/L (ref 135–146)
Total Bilirubin: 0.3 mg/dL (ref 0.2–1.2)
Total Protein: 7.5 g/dL (ref 6.1–8.1)
eGFR: 47 mL/min/1.73m2 — ABNORMAL LOW (ref >=60)

## 2024-03-08 NOTE — Progress Notes (Signed)
 CBC and CMP are stable.  Liver function is mildly elevated, most likely due to his statin use..  Patient should avoid all NSAIDs and alcohol use.

## 2024-03-12 ENCOUNTER — Ambulatory Visit: Admitting: Nurse Practitioner

## 2024-03-12 DIAGNOSIS — H81399 Other peripheral vertigo, unspecified ear: Secondary | ICD-10-CM | POA: Diagnosis not present

## 2024-03-12 DIAGNOSIS — M542 Cervicalgia: Secondary | ICD-10-CM | POA: Diagnosis not present

## 2024-03-12 DIAGNOSIS — M25512 Pain in left shoulder: Secondary | ICD-10-CM | POA: Diagnosis not present

## 2024-03-12 DIAGNOSIS — R4589 Other symptoms and signs involving emotional state: Secondary | ICD-10-CM | POA: Diagnosis not present

## 2024-03-12 DIAGNOSIS — R2681 Unsteadiness on feet: Secondary | ICD-10-CM | POA: Diagnosis not present

## 2024-03-12 DIAGNOSIS — R7401 Elevation of levels of liver transaminase levels: Secondary | ICD-10-CM | POA: Diagnosis not present

## 2024-03-12 DIAGNOSIS — N1832 Chronic kidney disease, stage 3b: Secondary | ICD-10-CM | POA: Diagnosis not present

## 2024-03-13 DIAGNOSIS — H401112 Primary open-angle glaucoma, right eye, moderate stage: Secondary | ICD-10-CM | POA: Diagnosis not present

## 2024-03-13 DIAGNOSIS — H3581 Retinal edema: Secondary | ICD-10-CM | POA: Diagnosis not present

## 2024-03-13 DIAGNOSIS — H401123 Primary open-angle glaucoma, left eye, severe stage: Secondary | ICD-10-CM | POA: Diagnosis not present

## 2024-03-13 DIAGNOSIS — H30102 Unspecified disseminated chorioretinal inflammation, left eye: Secondary | ICD-10-CM | POA: Diagnosis not present

## 2024-03-13 DIAGNOSIS — Z947 Corneal transplant status: Secondary | ICD-10-CM | POA: Diagnosis not present

## 2024-03-13 DIAGNOSIS — H353221 Exudative age-related macular degeneration, left eye, with active choroidal neovascularization: Secondary | ICD-10-CM | POA: Diagnosis not present

## 2024-03-13 DIAGNOSIS — H35371 Puckering of macula, right eye: Secondary | ICD-10-CM | POA: Diagnosis not present

## 2024-03-13 DIAGNOSIS — Z79899 Other long term (current) drug therapy: Secondary | ICD-10-CM | POA: Diagnosis not present

## 2024-03-13 DIAGNOSIS — H30033 Focal chorioretinal inflammation, peripheral, bilateral: Secondary | ICD-10-CM | POA: Diagnosis not present

## 2024-03-16 DIAGNOSIS — M542 Cervicalgia: Secondary | ICD-10-CM | POA: Diagnosis not present

## 2024-03-16 DIAGNOSIS — H81399 Other peripheral vertigo, unspecified ear: Secondary | ICD-10-CM | POA: Diagnosis not present

## 2024-03-16 DIAGNOSIS — M25512 Pain in left shoulder: Secondary | ICD-10-CM | POA: Diagnosis not present

## 2024-03-16 DIAGNOSIS — R2681 Unsteadiness on feet: Secondary | ICD-10-CM | POA: Diagnosis not present

## 2024-03-20 ENCOUNTER — Ambulatory Visit: Admitting: Adult Health

## 2024-03-20 VITALS — BP 127/66 | HR 85

## 2024-03-20 DIAGNOSIS — G43719 Chronic migraine without aura, intractable, without status migrainosus: Secondary | ICD-10-CM | POA: Diagnosis not present

## 2024-03-20 MED ORDER — ONABOTULINUMTOXINA 200 UNITS IJ SOLR
155.0000 [IU] | Freq: Once | INTRAMUSCULAR | Status: AC
Start: 2024-03-20 — End: 2024-03-20
  Administered 2024-03-20: 140 [IU] via INTRAMUSCULAR

## 2024-03-20 NOTE — Progress Notes (Signed)
 Botox - 200 units x 1 vial Lot: [id number] Expiration: 06/17/2026 NDC: 9976-6078-97  Bacteriostatic 0.9% Sodium Chloride - 4 mL  Lot: [id number] Expiration: 05/18/2025 NDC: 9590-8033-97  Dx: H56.280  B/B Witnessed by Stephania Samuel, CMA

## 2024-03-20 NOTE — Progress Notes (Signed)
 Update 03/20/2024 JM: Patient returns for repeat Botox .  Prior injection 12/20/2023.  Notes continued benefit with Botox   with >50% reduction of migraines.  Received first Vyepti  in April with initial improvement of migraines but started to gradually worsen therefore dosage increased to 300 mg for July infusion and has noticed significant improvement since then.  Reports total of approx 9 headache days (migrainous and tension type) over the past 1-2 months. Has been working with PT for cervicalgia which has also been helpful. Use of Fioricet or sumatriptan  as needed, typically with benefit.  Tolerated procedure well today. Did inject frontalis high near the hairline and did not inject procerus or corrugators due to difficulty with muscle droop, vision and dry eyes.  Return in 3 months for repeat injection. Request scheduling in office visit with Dr. Ines for routine follow up.         Consent Form Botulism Toxin Injection For Chronic Migraine    Reviewed orally with patient, additionally signature is on file:  Botulism toxin has been approved by the Federal drug administration for treatment of chronic migraine. Botulism toxin does not cure chronic migraine and it may not be effective in some patients.  The administration of botulism toxin is accomplished by injecting a small amount of toxin into the muscles of the neck and head. Dosage must be titrated for each individual. Any benefits resulting from botulism toxin tend to wear off after 3 months with a repeat injection required if benefit is to be maintained. Injections are usually done every 3-4 months with maximum effect peak achieved by about 2 or 3 weeks. Botulism toxin is expensive and you should be sure of what costs you will incur resulting from the injection.  The side effects of botulism toxin use for chronic migraine may include:   -Transient, and usually mild, facial weakness with facial injections  -Transient, and usually  mild, head or neck weakness with head/neck injections  -Reduction or loss of forehead facial animation due to forehead muscle weakness  -Eyelid drooping  -Dry eye  -Pain at the site of injection or bruising at the site of injection  -Double vision  -Potential unknown long term risks   Contraindications: You should not have Botox  if you are pregnant, nursing, allergic to albumin, have an infection, skin condition, or muscle weakness at the site of the injection, or have myasthenia gravis, Lambert-Eaton syndrome, or ALS.  It is also possible that as with any injection, there may be an allergic reaction or no effect from the medication. Reduced effectiveness after repeated injections is sometimes seen and rarely infection at the injection site may occur. All care will be taken to prevent these side effects. If therapy is given over a long time, atrophy and wasting in the muscle injected may occur. Occasionally the patient's become refractory to treatment because they develop antibodies to the toxin. In this event, therapy needs to be modified.  I have read the above information and consent to the administration of botulism toxin.    BOTOX  PROCEDURE NOTE FOR MIGRAINE HEADACHE  Contraindications and precautions discussed with patient(above). Aseptic procedure was observed and patient tolerated procedure. Procedure performed by Harlene Bogaert, AGNP-BC.   The condition has existed for more than 6 months, and pt does not have a diagnosis of ALS, Myasthenia Gravis or Lambert-Eaton Syndrome.  Risks and benefits of injections discussed and pt agrees to proceed with the procedure.  Written consent obtained  These injections are medically necessary. Pt  receives good benefits from these injections. These injections do not cause sedations or hallucinations which the oral therapies may cause.   Description of procedure:  The patient was placed in a sitting position. The standard protocol was used for  Botox  as follows, with 5 units of Botox  injected at each site:  -Frontalis muscle, bilateral injection, with 2 sites each side, medial injection was performed in the upper one third of the frontalis muscle, in the region vertical from the medial inferior edge of the superior orbital rim. The lateral injection was again in the upper one third of the forehead vertically above the lateral limbus of the cornea, 1.5 cm lateral to the medial injection site.  -Temporalis muscle injection, 4 sites, bilaterally. The first injection was 3 cm above the tragus of the ear, second injection site was 1.5 cm to 3 cm up from the first injection site in line with the tragus of the ear. The third injection site was 1.5-3 cm forward between the first 2 injection sites. The fourth injection site was 1.5 cm posterior to the second injection site. 5th site laterally in the temporalis  muscleat the level of the outer canthus.  -Occipitalis muscle injection, 3 sites, bilaterally. The first injection was done one half way between the occipital protuberance and the tip of the mastoid process behind the ear. The second injection site was done lateral and superior to the first, 1 fingerbreadth from the first injection. The third injection site was 1 fingerbreadth superiorly and medially from the first injection site.  -Cervical paraspinal muscle injection, 2 sites, bilaterally. The first injection site was 1 cm from the midline of the cervical spine, 3 cm inferior to the lower border of the occipital protuberance. The second injection site was 1.5 cm superiorly and laterally to the first injection site.  -Trapezius muscle injection was performed at 3 sites, bilaterally. The first injection site was in the upper trapezius muscle halfway between the inflection point of the neck, and the acromion. The second injection site was one half way between the acromion and the first injection site. The third injection was done between the first  injection site and the inflection point of the neck.    A total of 200 units of Botox  was prepared, 140 units of Botox  was injected as documented above, any Botox  not injected was wasted. The patient tolerated the procedure well, there were no complications of the above procedure.   Harlene Bogaert, AGNP-BC  [facility] [address] [place], [place] [address]  Phone [phone] Fax [phone] Note: This document was prepared with digital dictation and possible smart phrase technology. Any transcriptional errors that result from this process are unintentional.

## 2024-03-23 DIAGNOSIS — M25512 Pain in left shoulder: Secondary | ICD-10-CM | POA: Diagnosis not present

## 2024-03-23 DIAGNOSIS — R2681 Unsteadiness on feet: Secondary | ICD-10-CM | POA: Diagnosis not present

## 2024-03-23 DIAGNOSIS — M542 Cervicalgia: Secondary | ICD-10-CM | POA: Diagnosis not present

## 2024-03-23 DIAGNOSIS — H81399 Other peripheral vertigo, unspecified ear: Secondary | ICD-10-CM | POA: Diagnosis not present

## 2024-03-26 ENCOUNTER — Other Ambulatory Visit: Admitting: *Deleted

## 2024-04-03 DIAGNOSIS — N1832 Chronic kidney disease, stage 3b: Secondary | ICD-10-CM | POA: Diagnosis not present

## 2024-04-03 LAB — LAB REPORT - SCANNED: EGFR: 47

## 2024-04-04 ENCOUNTER — Other Ambulatory Visit: Payer: Self-pay

## 2024-04-04 DIAGNOSIS — N1832 Chronic kidney disease, stage 3b: Secondary | ICD-10-CM

## 2024-04-06 ENCOUNTER — Other Ambulatory Visit: Payer: Self-pay

## 2024-04-06 DIAGNOSIS — M542 Cervicalgia: Secondary | ICD-10-CM | POA: Diagnosis not present

## 2024-04-06 DIAGNOSIS — H81399 Other peripheral vertigo, unspecified ear: Secondary | ICD-10-CM | POA: Diagnosis not present

## 2024-04-06 DIAGNOSIS — R2681 Unsteadiness on feet: Secondary | ICD-10-CM | POA: Diagnosis not present

## 2024-04-06 DIAGNOSIS — M25512 Pain in left shoulder: Secondary | ICD-10-CM | POA: Diagnosis not present

## 2024-04-10 DIAGNOSIS — H81399 Other peripheral vertigo, unspecified ear: Secondary | ICD-10-CM | POA: Diagnosis not present

## 2024-04-10 DIAGNOSIS — R2681 Unsteadiness on feet: Secondary | ICD-10-CM | POA: Diagnosis not present

## 2024-04-10 DIAGNOSIS — M25512 Pain in left shoulder: Secondary | ICD-10-CM | POA: Diagnosis not present

## 2024-04-10 DIAGNOSIS — M542 Cervicalgia: Secondary | ICD-10-CM | POA: Diagnosis not present

## 2024-04-12 ENCOUNTER — Ambulatory Visit: Admitting: Neurology

## 2024-04-12 DIAGNOSIS — Z5181 Encounter for therapeutic drug level monitoring: Secondary | ICD-10-CM

## 2024-04-12 DIAGNOSIS — G40209 Localization-related (focal) (partial) symptomatic epilepsy and epileptic syndromes with complex partial seizures, not intractable, without status epilepticus: Secondary | ICD-10-CM

## 2024-04-12 NOTE — Procedures (Signed)
    History:  73 year old woman with epilepsy   EEG classification: Awake and drowsy  Duration: 27 minutes   Technical aspects: This EEG study was done with scalp electrodes positioned according to the 10-20 International system of electrode placement. Electrical activity was reviewed with band pass filter of 1-70Hz , sensitivity of 7 uV/mm, display speed of 65mm/sec with a 60Hz  notched filter applied as appropriate. EEG data were recorded continuously and digitally stored.   Description of the recording: The background rhythms of this recording consists of a fairly well modulated medium amplitude alpha rhythm of 9 Hz that is reactive to eye opening and closure. Present in the anterior head region is a 15-20 Hz beta activity. Photic stimulation was performed, did not show any abnormalities. Hyperventilation was also performed, did not show any abnormalities. Drowsiness was manifested by background fragmentation. No abnormal epileptiform discharges seen during this recording. There was no focal slowing. There were no electrographic seizure identified.   Abnormality: None   Impression: This is a normal awake and drowsy EEG. No evidence of interictal epileptiform discharges. Normal EEGs, however, do not rule out epilepsy.    Inayah Woodin, MD [facility]

## 2024-04-13 ENCOUNTER — Ambulatory Visit
Admission: RE | Admit: 2024-04-13 | Discharge: 2024-04-13 | Disposition: A | Discharge status: Home / Self Care | Source: Ambulatory Visit

## 2024-04-13 DIAGNOSIS — N181 Chronic kidney disease, stage 1: Secondary | ICD-10-CM | POA: Diagnosis not present

## 2024-04-13 DIAGNOSIS — N1832 Chronic kidney disease, stage 3b: Secondary | ICD-10-CM

## 2024-04-17 ENCOUNTER — Other Ambulatory Visit: Payer: Self-pay | Admitting: Neurology

## 2024-04-17 ENCOUNTER — Other Ambulatory Visit: Payer: Self-pay

## 2024-04-17 MED ORDER — LACOSAMIDE 100 MG PO TABS: 1.0000 | ORAL_TABLET | Freq: Two times a day (BID) | ORAL | 5 refills | Status: AC

## 2024-04-17 NOTE — Telephone Encounter (Signed)
 Requested Prescriptions   Pending Prescriptions Disp Refills   Lacosamide  100 MG TABS [Pharmacy Med Name: LACOSAMIDE  100MG  TABLETS] 60 tablet     Sig: TAKE 1 TABLET(100 MG) BY MOUTH IN THE MORNING AND AT BEDTIME   Last seen 04/12/24 for procedure visit, last office visit was 01/11/24, next appt is 06/12/24 Dispenses   Dispensed Days Supply Quantity Provider Pharmacy  lacosamide  100 mg tablet 03/18/2024 30 60 tablet Gregg Lek, MD [facility] #...  lacosamide  100 mg tablet 02/13/2024 30 60 tablet Camara, Amadou, MD [facility] DRUG STORE #...  lacosamide  100 mg tablet 01/21/2024 30 60 tablet Camara, Amadou, MD [facility] DR[facility] STORE #...  lacosamide  100 mg tablet 12/23/2023 30 60 tablet Camara, Amadou, MD [facility] DR[facility] STORE #...  lacosamide  100 mg tablet 11/21/2023 30 60 tablet Gregg Lek, MD [facility] #...  lacosamide  100 mg tablet 10/19/2023 30 60 tablet Camara, Amadou, MD [facility] DR[facility] STORE #.[place]

## 2024-04-17 NOTE — Progress Notes (Unsigned)
Review and sign

## 2024-04-17 NOTE — Telephone Encounter (Signed)
 Pt would like a call to discuss where the medication is being called into, she has checked with [facility] and has been told it has not been done, pt questioning if it is being sent to another pharmacy.  Pt states she is out of the medication for tomorrow, please call by end of day today if possible.

## 2024-04-17 NOTE — Progress Notes (Signed)
 Done. Thanks.

## 2024-04-17 NOTE — Addendum Note (Signed)
 Addended by: ONEITA NEVELYN BRAVO on: 04/17/2024 03:38 PM   Modules accepted: Orders

## 2024-04-22 ENCOUNTER — Other Ambulatory Visit: Payer: Self-pay | Admitting: Cardiology

## 2024-04-24 NOTE — Telephone Encounter (Signed)
 S/w patient to reschedule appointment with Dr Ines. She advised that she never heard back on the results from her HST. I advised that a MyChart message had been sent back in July and she stated that at this time, she is unable to read MyChart due to eye surgery. I was able to relay sleep results and get her rescheduled.

## 2024-05-03 DIAGNOSIS — Z79899 Other long term (current) drug therapy: Secondary | ICD-10-CM | POA: Diagnosis not present

## 2024-05-03 DIAGNOSIS — Z947 Corneal transplant status: Secondary | ICD-10-CM | POA: Diagnosis not present

## 2024-05-03 DIAGNOSIS — Z961 Presence of intraocular lens: Secondary | ICD-10-CM | POA: Diagnosis not present

## 2024-05-03 DIAGNOSIS — H401112 Primary open-angle glaucoma, right eye, moderate stage: Secondary | ICD-10-CM | POA: Diagnosis not present

## 2024-05-03 DIAGNOSIS — H353221 Exudative age-related macular degeneration, left eye, with active choroidal neovascularization: Secondary | ICD-10-CM | POA: Diagnosis not present

## 2024-05-03 DIAGNOSIS — H30033 Focal chorioretinal inflammation, peripheral, bilateral: Secondary | ICD-10-CM | POA: Diagnosis not present

## 2024-05-03 DIAGNOSIS — H3581 Retinal edema: Secondary | ICD-10-CM | POA: Diagnosis not present

## 2024-05-03 DIAGNOSIS — H401123 Primary open-angle glaucoma, left eye, severe stage: Secondary | ICD-10-CM | POA: Diagnosis not present

## 2024-05-09 DIAGNOSIS — L299 Pruritus, unspecified: Secondary | ICD-10-CM | POA: Diagnosis not present

## 2024-05-09 DIAGNOSIS — H938X3 Other specified disorders of ear, bilateral: Secondary | ICD-10-CM | POA: Diagnosis not present

## 2024-05-10 DIAGNOSIS — H401123 Primary open-angle glaucoma, left eye, severe stage: Secondary | ICD-10-CM | POA: Diagnosis not present

## 2024-05-10 DIAGNOSIS — H401112 Primary open-angle glaucoma, right eye, moderate stage: Secondary | ICD-10-CM | POA: Diagnosis not present

## 2024-05-16 ENCOUNTER — Telehealth: Payer: Self-pay | Admitting: Adult Health

## 2024-05-16 DIAGNOSIS — G43709 Chronic migraine without aura, not intractable, without status migrainosus: Secondary | ICD-10-CM

## 2024-05-16 MED ORDER — ONABOTULINUMTOXINA 200 UNITS IJ SOLR: INTRAMUSCULAR | 1 refills | Status: AC

## 2024-05-16 NOTE — Telephone Encounter (Signed)
 Submitted auth request to transition pt to SP, status is pending. Key: [id number]

## 2024-05-16 NOTE — Addendum Note (Signed)
 Addended by: HILLIARD HEATHER CROME on: 05/16/2024 11:07 AM   Modules accepted: Orders

## 2024-05-16 NOTE — Addendum Note (Signed)
 Addended by: WHITFIELD RAISIN L on: 05/16/2024 11:10 AM   Modules accepted: Orders

## 2024-05-16 NOTE — Telephone Encounter (Signed)
 Auth was approved, please send rx to [facility].  Auth#: [id number] (07/20/23-07/18/25)

## 2024-05-21 DIAGNOSIS — N1832 Chronic kidney disease, stage 3b: Secondary | ICD-10-CM | POA: Diagnosis not present

## 2024-05-21 DIAGNOSIS — I129 Hypertensive chronic kidney disease with stage 1 through stage 4 chronic kidney disease, or unspecified chronic kidney disease: Secondary | ICD-10-CM | POA: Diagnosis not present

## 2024-05-21 DIAGNOSIS — M069 Rheumatoid arthritis, unspecified: Secondary | ICD-10-CM | POA: Diagnosis not present

## 2024-05-22 LAB — LAB REPORT - SCANNED
Albumin, Urine POC: 5.4
Microalb Creat Ratio: 17

## 2024-05-23 NOTE — Progress Notes (Signed)
 Office Visit Note  [name]             Date of Birth: Mar 12, 1951           MRN: [id number]             PCP: Gerome Brunet, DO Referring: Gerome Brunet, DO Visit Date: 05/29/2024 Occupation: Data Unavailable  Subjective:  Bilateral shoulder pain   History of Present Illness: Brianna [name] is a 73 y.o. female with seropositive rheumatoid arthritis, ILD, iritis and degenerative disc disease.  She returns today after her last visit in August 2025.  She states that she developed discomfort in her left shoulder in late June.  She states she mentioned it to Dr. Ines who advised her to go for physical therapy.  She states she went for physical therapy and her symptoms improved.  But soon after she started having pain and discomfort in her right shoulder.  She states that the right shoulder joint pain persists.  She has been having discomfort in her right hand.  She has noticed decreased grip strength in her right hand.  None of the other joints are painful.  She states she has been having vertigo recently.  She will schedule an appointment with her neurologist.  She has intermittent discomfort in her neck and lower back which is manageable.  Her last Reclast  infusion was May 2025.  Is been taking calcium  and vitamin D .    Activities of Daily Living:  Patient reports morning stiffness for all day. Patient Reports nocturnal pain.  Difficulty dressing/grooming: Reports Difficulty climbing stairs: Denies Difficulty getting out of chair: Denies Difficulty using hands for taps, buttons, cutlery, and/or writing: Reports  Review of Systems  Constitutional:  Positive for fatigue.  HENT:  Positive for mouth dryness. Negative for mouth sores.   Eyes:  Positive for dryness.  Respiratory:  Negative for shortness of breath.   Cardiovascular:  Negative for chest pain and palpitations.  Gastrointestinal:  Negative for blood in stool, constipation and diarrhea.  Endocrine: Negative for  increased urination.  Genitourinary:  Negative for involuntary urination.  Musculoskeletal:  Positive for joint pain, gait problem, joint pain, myalgias, muscle weakness, morning stiffness, muscle tenderness and myalgias. Negative for joint swelling.  Skin:  Positive for color change and hair loss. Negative for rash and sensitivity to sunlight.  Allergic/Immunologic: Negative for susceptible to infections.  Neurological:  Positive for dizziness and numbness. Negative for headaches.  Hematological:  Negative for swollen glands.  Psychiatric/Behavioral:  Positive for sleep disturbance. Negative for depressed mood. The patient is not nervous/anxious.     PMFS History:  Patient Active Problem List   Diagnosis Date Noted   Iron deficiency 12/07/2023   Other fatigue 12/07/2023   Nocturnal hypoxemia 12/07/2023   Gastroesophageal reflux disease without esophagitis 03/24/2022   Altered taste 03/24/2022   Primary osteoarthritis of first carpometacarpal joint of left hand 12/18/2021   Coronary artery disease involving native coronary artery of native heart without angina pectoris 11/23/2021   Rhinitis, chronic 09/23/2021   Closed fracture of left patella 06/22/2021   Low back pain 05/28/2021   Displacement of lumbar intervertebral disc without myelopathy 05/28/2021   Episodic cluster headache, not intractable 03/24/2021   Morning headache 03/24/2021   Cerebrovascular accident (CVA) (HCC) 03/24/2021   Chronic migraine without aura, intractable, without status migrainosus 03/24/2021   Patellar fracture 02/11/2021   Dyslipidemia 02/11/2021   Pain of left thumb 02/02/2021   Anxiety 08/28/2020   Carotid artery  disease 08/28/2020   Late effects of cerebrovascular disease 08/28/2020   Macular degeneration 08/28/2020   Other long term (current) drug therapy 08/28/2020   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   Pure hypercholesterolemia 08/28/2020   Vitamin D  deficiency 08/28/2020    Exertional chest pain 06/24/2020   Hypertension 05/12/2020   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   Acute urinary tract infection 11/13/2019   Epilepsy (HCC) 11/13/2019   Corneal ulcer of left eye 12/05/2018   Acute left eye pain 12/05/2018   Endophthalmitis, acute, left 12/05/2018   Ulcer of left cornea 12/05/2018   URI, acute 05/19/2018   Cerumen impaction 05/19/2018   Primary open angle glaucoma (POAG) of left eye, severe stage 02/16/2018   Laceration of left middle finger without foreign body without damage to nail 11/23/2017   Trigger thumb, right thumb 10/05/2017   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   Numbness 04/25/2017   Pain 04/25/2017   Primary open angle glaucoma (POAG) of right eye, moderate stage 04/15/2017   Bilateral impacted cerumen 01/31/2017   History of macular degeneration 09/13/2016   History of migraine 09/06/2016   History of seizures 09/06/2016   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   Secondary corneal edema of right eye 06/28/2016   Lumbar radiculopathy 05/24/2016   Degeneration of lumbosacral intervertebral disc 05/24/2016   Iritis of right eye 05/12/2016   Rheumatoid arthritis with positive rheumatoid factor (HCC) 05/11/2016   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   High risk medication use 05/11/2016   Osteoporosis 05/11/2016   ILD (interstitial lung disease) (HCC) 05/11/2016   Intractable chronic migraine without aura and without status migrainosus 04/02/2016   Intractable chronic migraine without aura 04/02/2016   Peripheral focal chorioretinal inflammation of both eyes 01/04/2016   Squamous blepharitis of upper and lower eyelids of both eyes 10/22/2015   Dry eyes, bilateral 10/21/2015   History of high risk medication treatment 04/28/2015   Cystoid macular edema of left eye 09/23/2014   History of carotid endarterectomy 06/27/2014   Acquired myogenic ptosis of both eyelids 06/07/2014   Myogenic ptosis, acquired 06/07/2014    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   Migraine without aura 04/19/2013   Seizures (HCC) 04/19/2013   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   Keratoconus, 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   Minor corneal opacity 08/02/2011   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   Cough 01/14/2011   Pulmonary fibrosis (HCC) 01/14/2011    Past Medical History:  Diagnosis Date   Arthritis    in the neck   Carotid artery occlusion    Epilepsy (HCC)    GERD (gastroesophageal reflux disease)    Glaucoma    Hyperlipidemia    Hypertension    Keratoconus of both eyes 1981   Macular degeneration    Migraines    Peripheral vascular disease    carotid occlusion surgery on left   Raynaud's disease    Retinal edema    Rheumatoid arthritis(714.0)    Stroke (HCC)    two mild strokes presumed from left carotid stenosis    Family History  Problem Relation Age of Onset   Heart disease Mother    Lung disease Mother        ? disease process   Uterine cancer Mother    Heart disease Father    Clotting disorder Father    Collagen disease Father    Rheum arthritis Sister    High Cholesterol Sister    Epilepsy Sister    High Cholesterol Brother    High blood pressure Brother  High blood pressure Brother    High Cholesterol Brother    High blood pressure Brother    High Cholesterol Brother    Cervical cancer Maternal Aunt    Rheum arthritis Maternal Grandmother    Prostate cancer Maternal Grandfather    Past Surgical History:  Procedure Laterality Date   CAROTID ARTERY ANGIOPLASTY  2008   Dr Dyane   CAROTID ENDARTERECTOMY Left 11/15/2007   cataract extraction Right 04/2014   [facility]   CHOLECYSTECTOMY  1995   CORNEAL TRANSPLANT     x 5 ; steroid inj. 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   [facility]   CORNEAL TRANSPLANT Left 04/17/2019   CORNEAL TRANSPLANT Right 01/25/2024   EYE SURGERY Right 12/2016   cornea repair    EYE SURGERY Right 09/20/2017   cornea transplant    GLAUCOMA SURGERY Right  2018   ORIF PATELLA Left 02/12/2021   Procedure: OPEN REDUCTION INTERNAL (ORIF) FIXATION PATELLA;  Surgeon: Sharl Selinda Dover, MD;  Location: [facility] OR;  Service: Orthopedics;  Laterality: Left;   TRIGGER FINGER RELEASE Right 05/16/2018   Procedure: RELEASE TRIGGER FINGER/A-1 PULLEY RIGHT THUMB;  Surgeon: Murrell Kuba, MD;  Location: [facility];  Service: Orthopedics;  Laterality: Right;   Social History   Tobacco Use   Smoking status: Former    Current packs/day: 0.00    Average packs/day: 0.5 packs/day for 14.0 years (7.0 ttl pk-yrs)    Types: Cigarettes    Start date: 69    Quit date: 1989    Years since quitting: 36.8    Passive exposure: Never   Smokeless tobacco: Never  Vaping Use   Vaping status: Never Used  Substance Use Topics   Alcohol use: No   Drug use: No   Social History   Social History Narrative   Patient is married to [place]), has 2 children   Patient is right handed   Education level is Energy Manager degree   Caffeine  consumption is about 2 cups daily   Lives at home with husband      Immunization History  Administered Date(s) Administered   Fluad Quad(high Dose 65+) 05/14/2022   INFLUENZA, HIGH DOSE SEASONAL PF 04/09/2018   Influenza Split 04/26/2013, 04/08/2017   Influenza, Quadrivalent, Recombinant, Inj, Pf 04/21/2017, 03/19/2019   Influenza-Unspecified 04/19/2016, 10/29/2020   PFIZER(Purple Top)SARS-COV-2 Vaccination 08/24/2019, 09/18/2019, 05/12/2020, 10/29/2020   Pneumococcal Conjugate-13 08/27/2013, 07/05/2017   Pneumococcal Polysaccharide-23 01/14/2011   Tdap 11/15/2017   Unspecified SARS-COV-2 Vaccination 10/29/2020   Zoster Recombinant(Shingrix) 07/19/2019, 11/28/2019     Objective: Vital Signs: BP 134/84   Pulse 99   Temp 98.1 F (36.7 C)   Resp 15   Ht 5' (1.524 m)   Wt 115 lb 12.8 oz (52.5 kg)   BMI 22.62 kg/m    Physical Exam Vitals and nursing note reviewed.  Constitutional:      Appearance: She is  well-developed.  HENT:     Head: Normocephalic and atraumatic.  Eyes:     Conjunctiva/sclera: Conjunctivae normal.  Cardiovascular:     Rate and Rhythm: Normal rate and regular rhythm.     Heart sounds: Normal heart sounds.  Pulmonary:     Effort: Pulmonary effort is normal.     Breath sounds: Normal breath sounds.  Abdominal:     General: Bowel sounds are normal.     Palpations: Abdomen is soft.  Musculoskeletal:     Cervical back: Normal range of motion.  Lymphadenopathy:  Cervical: No cervical adenopathy.  Skin:    General: Skin is warm and dry.     Capillary Refill: Capillary refill takes less than 2 seconds.  Neurological:     Mental Status: She is alert and oriented to person, place, and time.  Psychiatric:        Behavior: Behavior normal.      Musculoskeletal Exam: Cervical, thoracic and lumbar spine Juengel range of motion.  She had no SI joint tenderness.  Shoulders were in good range of motion with discomfort on range of motion of her right shoulder.  Elbow joints and wrist joints with good range of motion.  She had bilateral MCP PIP and DIP thickening with no synovitis.  Hip joints and knee joints in good range of motion without any warmth swelling or effusion.  There was no tenderness over ankles or MTPs.  CDAI Exam: CDAI Score: 7  Patient Global: 40 / 100; Provider Global: 20 / 100 Swollen: 0 ; Tender: 1  Joint Exam 05/29/2024      Right  Left  Glenohumeral   Tender      There is currently no information documented on the homunculus. Go to the Rheumatology activity and complete the homunculus joint exam.  Investigation: No additional findings.  Imaging: No results found.  Recent Labs: Lab Results  Component Value Date   WBC 9.1 03/07/2024   HGB 12.1 03/07/2024   PLT 244 03/07/2024   NA 136 03/07/2024   K 5.1 03/07/2024   CL 102 03/07/2024   CO2 26 03/07/2024   GLUCOSE 137 (H) 03/07/2024   BUN 21 03/07/2024   CREATININE 1.24 (H) 03/07/2024    BILITOT 0.3 03/07/2024   ALKPHOS 104 03/18/2021   AST 36 (H) 03/07/2024   ALT 25 03/07/2024   PROT 7.5 03/07/2024   ALBUMIN 4.3 03/18/2021   CALCIUM  9.6 03/07/2024   GFRAA 107 05/26/2020   QFTBGOLDPLUS NEGATIVE 08/12/2022    Speciality Comments: Last Reclast  11/30/2017, 12/06/2023 Forteo  completed July 2024 TB Gold May 16, 2023  Procedures:  Large Joint Inj: R glenohumeral on 05/29/2024 12:34 PM Indications: pain Details: 27 G 1.5 in needle, posterior approach  Arthrogram: No  Medications: 1 mL lidocaine  1 %; 40 mg triamcinolone  acetonide 40 MG/ML Aspirate: 0 mL Outcome: tolerated well, no immediate complications  Risk of infection, tendon injury, nerve injury, dermal atrophy and hypopigmentation were discussed. Procedure, treatment alternatives, risks and benefits explained, specific risks discussed. Consent was given by the patient. Immediately prior to procedure a time out was called to verify the correct patient, procedure, equipment, support staff and site/side marked as required. Patient was prepped and draped in the usual sterile fashion.     Allergies: Morphine, Morphine and codeine, Alphagan [brimonidine], Amlodipine , Codeine, Dorzolamide hcl-timolol mal, Hydrocodone-acetaminophen , Sulfa antibiotics, and Sulfamethoxazole   Assessment / Plan:     Visit Diagnoses: Rheumatoid arthritis involving multiple sites with positive rheumatoid factor (HCC) -patient denies having a flare of rheumatoid arthritis.  However she has been having increased pain and discomfort in her shoulders for the last few months.  She denies interruption in her treatment.  I will check sed rate today.  Plan: Sedimentation rate  ILD (interstitial lung disease) (HCC) - Dr. Darlean.  Previous chest x-ray 09/22/2021 stable fibrotic changes.  No acute infiltrate.  Pulmonary function testing performed on 05/20/2022.  Patient has not seen Dr. Darlean since 2024.  Patient states she was advised to come back in couple  of years.  She denies any shortness  of breath.  I advised her to schedule an appointment with Dr. Darlean.  Last high-resolution CT was in 2021.  I advised that she should have every 6 months PFTs and annual CT scan.  She will discuss with Dr. Darlean.  High risk medication use - Arava 20 mg 1 tablet by mouth daily and Humira 40 mg sq every 14 days prescribed by Dr. Maree. -March 07, 2024 CBC normal, CMP shows creatinine 1.24 and AST 36.  Will check labs today.  Plan: CBC with Differential/Platelet, Comprehensive metabolic panel with GFR.  Information reimmunization was placed in the AVS.  She was advised to hold Imuran and leflunomide if she develops an infection and resume after the infection resolves.  Annual skin examination to screen for skin cancer was advised.  Elevated serum creatinine - Most likely due to use of diuretics.  Iritis of right eye - Followed by Dr.Shah.  Patient was evaluated by ophthalmology on February 16, 2024.  Her eye disease has been quiet.  Chronic right shoulder pain -patient states she developed left shoulder joint discomfort in June which responded to physical therapy.  She has been having right shoulder joint pain for the last 3 months now.  She states she has tried exercises without much relief.  She requested a cortisone injection.  After informed consent was obtained and side effects were discussed right glenohumeral joint was injected with lidocaine  and Kenalog  as described above.  She tolerated the procedure well.  Postprocedure instructions were given.  I will also send her to physical therapy.  Plan: XR Shoulder Right.  The x-rays were unremarkable.  X-ray findings were reviewed with the patient.  Trochanteric bursitis, right hip - resolved.  DDD (degenerative disc disease), cervical-continues to have some stiffness which is manageable.  Degeneration of intervertebral disc of lumbar region without discogenic back pain or lower extremity pain -she states the pain is  manageable.  Under care of Dr. Joshua.  Age-related osteoporosis without current pathological fracture - 02/21/24 DEXA T-score -3.8, BMD 0.489 left 1/3 forearm, right femoral neck -2.6, left femoral neck -2.8, lumbar spine 16% change.  She had Reclast  infusion on Dec 06, 2023.  She has been taking calcium  and vitamin D .  Primary hypertension - She is on amlodipine  prescribed by cardiologist.  Blood pressure was 134/84 today.  Mixed hyperlipidemia - She is on Crestor , Repatha  and Zetia  combination by cardiology.  History of seizures - On lacosamide , followed by neurology.  Balance problem  History of migraine -followed by neurology.  History of glaucoma - under care of Dr. Elester and Dr. Almond Dr. Veronda office visit note from 10/01/2023: Pred x 4 OD, Durezol  x4 Os, timolol x1 OU AM, restasis  x2 OU.  History of carotid endarterectomy  History of macular degeneration  Orders: Orders Placed This Encounter  Procedures   XR Shoulder Right   CBC with Differential/Platelet   Comprehensive metabolic panel with GFR   Sedimentation rate   No orders of the defined types were placed in this encounter.    Follow-Up Instructions: Return in about 5 months (around 10/27/2024) for Rheumatoid arthritis, ILD, iritis.   Maya Nash, MD  Note - This record has been created using Animal nutritionist.  Chart creation errors have been sought, but may not always  have been located. Such creation errors do not reflect on  the standard of medical care.

## 2024-05-29 ENCOUNTER — Ambulatory Visit: Attending: Rheumatology | Admitting: Rheumatology

## 2024-05-29 ENCOUNTER — Encounter: Payer: Self-pay | Admitting: Rheumatology

## 2024-05-29 ENCOUNTER — Ambulatory Visit

## 2024-05-29 VITALS — BP 134/84 | HR 99 | Temp 98.1°F | Resp 15 | Ht 60.0 in | Wt 115.8 lb

## 2024-05-29 DIAGNOSIS — M25511 Pain in right shoulder: Secondary | ICD-10-CM

## 2024-05-29 DIAGNOSIS — M0579 Rheumatoid arthritis with rheumatoid factor of multiple sites without organ or systems involvement: Secondary | ICD-10-CM

## 2024-05-29 DIAGNOSIS — R7989 Other specified abnormal findings of blood chemistry: Secondary | ICD-10-CM

## 2024-05-29 DIAGNOSIS — M51369 Other intervertebral disc degeneration, lumbar region without mention of lumbar back pain or lower extremity pain: Secondary | ICD-10-CM

## 2024-05-29 DIAGNOSIS — Z79899 Other long term (current) drug therapy: Secondary | ICD-10-CM | POA: Diagnosis not present

## 2024-05-29 DIAGNOSIS — Z87898 Personal history of other specified conditions: Secondary | ICD-10-CM

## 2024-05-29 DIAGNOSIS — G8929 Other chronic pain: Secondary | ICD-10-CM

## 2024-05-29 DIAGNOSIS — J849 Interstitial pulmonary disease, unspecified: Secondary | ICD-10-CM | POA: Diagnosis not present

## 2024-05-29 DIAGNOSIS — Z8669 Personal history of other diseases of the nervous system and sense organs: Secondary | ICD-10-CM

## 2024-05-29 DIAGNOSIS — R2689 Other abnormalities of gait and mobility: Secondary | ICD-10-CM

## 2024-05-29 DIAGNOSIS — H209 Unspecified iridocyclitis: Secondary | ICD-10-CM

## 2024-05-29 DIAGNOSIS — M7061 Trochanteric bursitis, right hip: Secondary | ICD-10-CM

## 2024-05-29 DIAGNOSIS — M810 Age-related osteoporosis without current pathological fracture: Secondary | ICD-10-CM

## 2024-05-29 DIAGNOSIS — I10 Essential (primary) hypertension: Secondary | ICD-10-CM

## 2024-05-29 DIAGNOSIS — E782 Mixed hyperlipidemia: Secondary | ICD-10-CM

## 2024-05-29 DIAGNOSIS — M5030 Other cervical disc degeneration, unspecified cervical region: Secondary | ICD-10-CM | POA: Diagnosis not present

## 2024-05-29 DIAGNOSIS — Z9889 Other specified postprocedural states: Secondary | ICD-10-CM

## 2024-05-29 MED ORDER — TRIAMCINOLONE ACETONIDE 40 MG/ML IJ SUSP
40.0000 mg | INTRAMUSCULAR | Status: AC | PRN
  Administered 2024-05-29: 40 mg via INTRA_ARTICULAR

## 2024-05-29 MED ORDER — LIDOCAINE HCL 1 % IJ SOLN
1.0000 mL | INTRAMUSCULAR | Status: AC | PRN
  Administered 2024-05-29: 1 mL

## 2024-05-29 NOTE — Patient Instructions (Addendum)
 Please schedule an appointment with Dr. Darlean  Standing Labs We placed an order today for your standing lab work.   Please have your standing labs drawn in February and every 3 months  Please have your labs drawn 2 weeks prior to your appointment so that the provider can discuss your lab results at your appointment, if possible.  Please note that you may see your imaging and lab results in MyChart before we have reviewed them. We will contact you once all results are reviewed. Please allow our office up to 72 hours to thoroughly review all of the results before contacting the office for clarification of your results.  WALK-IN LAB HOURS  Monday through Thursday from 8:00 am -12:30 pm and 1:00 pm-4:30 pm and Friday from 8:00 am-12:00 pm.  Patients with office visits requiring labs will be seen before walk-in labs.  You may encounter longer than normal wait times. Please allow additional time. Wait times may be shorter on  Monday and Thursday afternoons.  We do not book appointments for walk-in labs. We appreciate your patience and understanding with our staff.   Labs are drawn by [facility]. Please bring your co-pay at the time of your lab draw.  You may receive a bill from [facility] for your lab work.  Please note if you are on Hydroxychloroquine and and an order has been placed for a Hydroxychloroquine level,  you will need to have it drawn 4 hours or more after your last dose.  If you wish to have your labs drawn at another location, please call the office 24 hours in advance so we can fax the orders.  The office is located at [address], [address], [place], [place] [address]   If you have any questions regarding directions or hours of operation,  please call [phone].   As a reminder, please drink plenty of water prior to coming for your lab work. Thanks!   Vaccines You are taking a medication(s) that can suppress your immune system.  The following immunizations are  recommended: Flu annually RSV Covid-19  Td/Tdap (tetanus, diphtheria, pertussis) every 10 years Pneumonia (Prevnar 15 then Pneumovax 23 at least 1 year apart.  Alternatively, can take Prevnar 20 without needing additional dose) Shingrix: 2 doses from 4 weeks to 6 months apart  Please check with your PCP to make sure you are up to date.   If you have signs or symptoms of an infection or start antibiotics: First, call your PCP for workup of your infection. Hold your medication through the infection, until you complete your antibiotics, and until symptoms resolve if you take the following: Injectable medication (Actemra, Benlysta, Cimzia, Cosentyx, Enbrel, Humira, Kevzara, Orencia, Remicade, Simponi, Stelara, Taltz, Tremfya) Methotrexate Leflunomide (Arava) Mycophenolate (Cellcept) Earma, Olumiant, or Rinvoq  Please get annual skin examination to screen for skin cancer while you are on Humira.  Please use sunscreen and sun protection.

## 2024-05-30 ENCOUNTER — Telehealth: Payer: Self-pay | Admitting: *Deleted

## 2024-05-30 ENCOUNTER — Ambulatory Visit: Payer: Self-pay | Admitting: Rheumatology

## 2024-05-30 DIAGNOSIS — R200 Anesthesia of skin: Secondary | ICD-10-CM

## 2024-05-30 DIAGNOSIS — R2689 Other abnormalities of gait and mobility: Secondary | ICD-10-CM

## 2024-05-30 LAB — COMPREHENSIVE METABOLIC PANEL WITH GFR
AG Ratio: 1.3 (calc) (ref 1.0–2.5)
ALT: 30 U/L — ABNORMAL HIGH (ref 6–29)
AST: 48 U/L — ABNORMAL HIGH (ref 10–35)
Albumin: 4.4 g/dL (ref 3.6–5.1)
Alkaline phosphatase (APISO): 64 U/L (ref 37–153)
BUN/Creatinine Ratio: 21 (calc) (ref 6–22)
BUN: 26 mg/dL — ABNORMAL HIGH (ref 7–25)
CO2: 26 mmol/L (ref 20–32)
Calcium: 9.5 mg/dL (ref 8.6–10.4)
Chloride: 99 mmol/L (ref 98–110)
Creat: 1.24 mg/dL — ABNORMAL HIGH (ref 0.60–1.00)
Globulin: 3.3 g/dL (ref 1.9–3.7)
Glucose, Bld: 121 mg/dL — ABNORMAL HIGH (ref 65–99)
Potassium: 4.1 mmol/L (ref 3.5–5.3)
Sodium: 138 mmol/L (ref 135–146)
Total Bilirubin: 0.5 mg/dL (ref 0.2–1.2)
Total Protein: 7.7 g/dL (ref 6.1–8.1)
eGFR: 47 mL/min/1.73m2 — ABNORMAL LOW (ref >=60)

## 2024-05-30 LAB — CBC WITH DIFFERENTIAL/PLATELET
Absolute Lymphocytes: 2051 {cells}/uL (ref 850–3900)
Absolute Monocytes: 1045 {cells}/uL — ABNORMAL HIGH (ref 200–950)
Basophils Absolute: 65 {cells}/uL (ref 0–200)
Basophils Relative: 0.5 %
Eosinophils Absolute: 181 {cells}/uL (ref 15–500)
Eosinophils Relative: 1.4 %
HCT: 40.5 % (ref 35.0–45.0)
Hemoglobin: 13.1 g/dL (ref 11.7–15.5)
MCH: 31.8 pg (ref 27.0–33.0)
MCHC: 32.3 g/dL (ref 32.0–36.0)
MCV: 98.3 fL (ref 80.0–100.0)
MPV: 11.8 fL (ref 7.5–12.5)
Monocytes Relative: 8.1 %
Neutro Abs: 9559 {cells}/uL — ABNORMAL HIGH (ref 1500–7800)
Neutrophils Relative %: 74.1 %
Platelets: 247 Thousand/uL (ref 140–400)
RBC: 4.12 Million/uL (ref 3.80–5.10)
RDW: 13.4 % (ref 11.0–15.0)
Total Lymphocyte: 15.9 %
WBC: 12.9 Thousand/uL — ABNORMAL HIGH (ref 3.8–10.8)

## 2024-05-30 LAB — SEDIMENTATION RATE: Sed Rate: 11 mm/h (ref 0–30)

## 2024-05-30 NOTE — Progress Notes (Signed)
 We will observe for now.  She should have repeat labs in 3 months.

## 2024-05-30 NOTE — Telephone Encounter (Signed)
 Referral placed and left message to advise patient referral has been placed.

## 2024-05-30 NOTE — Telephone Encounter (Signed)
 Patient contacted the office stating she was seen in the office yesterday. Patient states she was advised to follow up with neurology about some issues she has been having. Patient states she reached out to them but they are requesting a new referral since these issues are different then what they have been treating the patient for. Patient states she is having numbness in her right arm done into her hand. Patient states she has no ability to walk, turn knobs, grasp or squeeze anything. Patient states she is also having trouble with her balance. Patient states she sees Dr. Gregg with [facility]  Okay to place referral?

## 2024-05-30 NOTE — Progress Notes (Signed)
 White cell count is elevated please ask patient if she had a recent infection or steroid shot or prednisone .  Glucose is mildly elevated, probably a fasting sample.  Liver functions are mildly elevated please advise patient not to take any NSAIDs or alcohol.  Sed rate is normal.  Labs do not indicate inflammation.

## 2024-05-30 NOTE — Telephone Encounter (Signed)
 Yes

## 2024-05-30 NOTE — Addendum Note (Signed)
 Addended by: CENA ALFONSO CROME on: 05/30/2024 12:32 PM   Modules accepted: Orders

## 2024-06-05 NOTE — Progress Notes (Unsigned)
 [facility] [address] [place]. [place] [address] [phone]       OFFICE FOLLOW UP NOTE  [name] Date of Birth:  1951-05-15 Medical Record Number:  [id number]     Reason for visit: Chronic migraine    SUBJECTIVE:  CHIEF COMPLAINT:  No chief complaint on file.   Follow-up visit:   Brief HPI:   Brianna [name] is a 73 y.o. female who is followed for chronic migraine headaches.  She was started on Botox  in 08/2020 and Vyepti  added in 10/2023. She is also followed by Dr. Gregg for seizure management.      Interval history:  Returns today for migraine follow-up and as required by insurance for continuation of Vyepti .  She previously received Botox  on 03/20/2024.  She is scheduled for repeat Botox  06/09/2024.       ROS:   14 system review of systems performed and negative with exception of ***  PMH:  Past Medical History:  Diagnosis Date   Arthritis    in the neck   Carotid artery occlusion    Epilepsy (HCC)    GERD (gastroesophageal reflux disease)    Glaucoma    Hyperlipidemia    Hypertension    Keratoconus of both eyes 1981   Macular degeneration    Migraines    Peripheral vascular disease    carotid occlusion surgery on left   Raynaud's disease    Retinal edema    Rheumatoid arthritis(714.0)    Stroke (HCC)    two mild strokes presumed from left carotid stenosis    PSH:  Past Surgical History:  Procedure Laterality Date   CAROTID ARTERY ANGIOPLASTY  2008   Dr Dyane   CAROTID ENDARTERECTOMY Left 11/15/2007   cataract extraction Right 04/2014   [facility]   CHOLECYSTECTOMY  1995   CORNEAL TRANSPLANT     x 5 ; steroid inj. 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   [facility]   CORNEAL TRANSPLANT Left 04/17/2019   CORNEAL TRANSPLANT Right 01/25/2024   EYE SURGERY Right 12/2016   cornea repair    EYE SURGERY Right 09/20/2017   cornea transplant    GLAUCOMA SURGERY Right 2018   ORIF  PATELLA Left 02/12/2021   Procedure: OPEN REDUCTION INTERNAL (ORIF) FIXATION PATELLA;  Surgeon: Sharl Selinda Dover, MD;  Location: [facility] OR;  Service: Orthopedics;  Laterality: Left;   TRIGGER FINGER RELEASE Right 05/16/2018   Procedure: RELEASE TRIGGER FINGER/A-1 PULLEY RIGHT THUMB;  Surgeon: Murrell Kuba, MD;  Location: [facility];  Service: Orthopedics;  Laterality: Right;    Social History:  Social History   Socioeconomic History   Marital status: Married    Spouse name: Brianna [name]   Number of children: 2   Years of education: Bachelor   Highest education level: Bachelor's degree (e.g., BA, AB, BS)  Occupational History   Occupation: Runner, Broadcasting/film/video   Occupation: AG TEACHER    Employer: [employer]    Comment: retired  Tobacco Use   Smoking status: Former    Current packs/day: 0.00    Average packs/day: 0.5 packs/day for 14.0 years (7.0 ttl pk-yrs)    Types: Cigarettes    Start date: 19    Quit date: 1989    Years since quitting: 36.9    Passive exposure: Never   Smokeless tobacco: Never  Vaping Use   Vaping status: Never Used  Substance and Sexual Activity   Alcohol use: No   Drug use: No  Sexual activity: Not on file  Other Topics Concern   Not on file  Social History Narrative   Patient is married to Brianna [name]), has 2 children   Patient is right handed   Education level is Energy Manager degree   Caffeine  consumption is about 2 cups daily   Lives at home with husband    Social Drivers of Corporate Investment Banker Strain: Not on file  Food Insecurity: Not on file  Transportation Needs: Not on file  Physical Activity: Not on file  Stress: Not on file  Social Connections: Not on file  Intimate Partner Violence: Not on file    Family History:  Family History  Problem Relation Age of Onset   Heart disease Mother    Lung disease Mother        ? disease process   Uterine cancer Mother    Heart disease Father    Clotting disorder Father     Collagen disease Father    Rheum arthritis Sister    High Cholesterol Sister    Epilepsy Sister    High Cholesterol Brother    High blood pressure Brother    High blood pressure Brother    High Cholesterol Brother    High blood pressure Brother    High Cholesterol Brother    Cervical cancer Maternal Aunt    Rheum arthritis Maternal Grandmother    Prostate cancer Maternal Grandfather     Medications:   Current Outpatient Medications on File Prior to Visit  Medication Sig Dispense Refill   ACETAMINOPHEN -BUTALBITAL  50-325 MG TABS TAKE 1 TABLET BY MOUTH EVERY 4 HOURS FOR 7 DAYS AS NEEDED; Duration: 7     amLODipine  (NORVASC ) 5 MG tablet Take 1 tablet (5 mg total) by mouth daily. 90 tablet 1   ascorbic Acid (VITAMIN C) 500 MG CPCR Take 500 mg by mouth daily.     aspirin  325 MG tablet Take 325 mg by mouth daily.     botulinum toxin Type A  (BOTOX ) 200 units injection Provider to inject 140 units into the muscles of the head and neck every 12 weeks. Discard remainder. 1 each 1   butalbital -acetaminophen -caffeine  (FIORICET, ESGIC) 50-325-40 MG tablet Take 1 tablet by mouth every 6 (six) hours as needed for headache. 10 tablet 4   Calcium  Carbonate-Vit D-Min (CALCIUM  1200 PO) Take 2 capsules by mouth as needed.     chlorthalidone  (HYGROTON ) 25 MG tablet Take 1 tablet (25 mg total) by mouth daily. 90 tablet 1   Cholecalciferol (VITAMIN D  PO) Take 1,000 Units by mouth daily.      cloNIDine  (CATAPRES ) 0.1 MG tablet 1 tablet Orally twice per day     diazepam (VALIUM) 10 MG tablet Take 10 mg by mouth daily as needed.     Difluprednate  0.05 % EMUL Place 1 drop into the left eye in the morning, at noon, in the evening, and at bedtime.     Eptinezumab -jjmr (VYEPTI ) 100 MG/ML injection Inject 1 mL (100 mg total) into the vein every 3 (three) months. 1.12 mL 4   Evolocumab  (REPATHA  SURECLICK) 140 MG/ML SOAJ Inject 140 mg into the skin every 14 (fourteen) days. 6 mL 0   ezetimibe  (ZETIA ) 10 MG tablet Take  1 tablet (10 mg total) by mouth daily. 90 tablet 1   famotidine  (PEPCID ) 40 MG tablet 1 tab(s) orally 2 times a day; Duration: 30 days     HUMIRA PEN 40 MG/0.4ML PNKT Inject 40 mg into the skin every 14 (fourteen)  days. As directed     Insulin  Pen Needle 31G X 5 MM MISC Use to inject Forteo  once daily. DO NOT REUSE. Forteo  received through patient assistance 100 each 2   Lacosamide  100 MG TABS Take 1 tablet (100 mg total) by mouth 2 (two) times daily. 60 tablet 5   leflunomide (ARAVA) 20 MG tablet Take 20 mg by mouth daily.     LORazepam  (ATIVAN ) 0.5 MG tablet Take 0.5 mg by mouth daily as needed for anxiety or sleep.     losartan  (COZAAR ) 100 MG tablet Take 1 tablet (100 mg total) by mouth daily. 90 tablet 3   pantoprazole  (PROTONIX ) 40 MG tablet TAKE 1 TABLET(40 MG) BY MOUTH DAILY 90 tablet 3   PRED FORTE 1 % ophthalmic suspension 6 drops right eye Ophthalmic every day     promethazine  (PHENERGAN ) 25 MG tablet Take 1 tablet (25 mg total) by mouth every 6 (six) hours as needed for nausea or vomiting. 30 tablet 11   RESTASIS  0.05 % ophthalmic emulsion 2 (two) times daily.     rosuvastatin  (CRESTOR ) 20 MG tablet Take 1 tablet (20 mg total) by mouth daily. 90 tablet 2   SUMAtriptan  (IMITREX ) 6 MG/0.5ML SOLN injection Inject 0.5 mLs (6 mg total) into the skin every 2 (two) hours as needed for migraine or headache. Take one dose at headache onset, can take additional dose 2hrs later if needed. No more then 2 injections in 24hrs (each kit contains 2 injections) 6 mL 11   tacrolimus  (PROGRAF ) 1 MG capsule Take 1 mg by mouth daily.     valACYclovir (VALTREX) 1000 MG tablet Take 1,000 mg by mouth 2 (two) times daily.     verapamil (VERELAN) 240 MG 24 hr capsule 1 capsule. (Patient not taking: Reported on 05/29/2024)     Zoledronic  Acid (RECLAST  IV) Inject into the vein.     Current Facility-Administered Medications on File Prior to Visit  Medication Dose Route Frequency Provider Last Rate Last Admin    0.9 %  sodium chloride  infusion   Intravenous PRN Thompson, Kathryn R, PA-C        Allergies:   Allergies  Allergen Reactions   Morphine Other (See Comments)    Difficulty breathing   Morphine And Codeine Shortness Of Breath   Alphagan [Brimonidine]     Eyelid swelling, scratching Allergic to preservative contained in this med   Amlodipine  Other (See Comments)    Causes my gums to grow up on my teeth.  Have had to have them cut back twice   Codeine Nausea And Vomiting   Dorzolamide Hcl-Timolol Mal     Inflammation of the eyelid, scratching Allergic to preservative contained in this med   Hydrocodone-Acetaminophen  Nausea And Vomiting    But tolerates tylenol    Sulfa Antibiotics Rash    Large bumps   Sulfamethoxazole Rash      OBJECTIVE:  Physical Exam  There were no vitals filed for this visit. There is no height or weight on file to calculate BMI. No results found.   General: well developed, well nourished, seated, in no evident distress Head: head normocephalic and atraumatic.   Neck: supple with no carotid or supraclavicular bruits Cardiovascular: regular rate and rhythm, no murmurs Musculoskeletal: no deformity Skin:  no rash/petichiae Vascular:  Normal pulses all extremities   Neurologic Exam Mental Status: Awake and fully alert. Oriented to place and time. Recent and remote memory intact. Attention span, concentration and fund of knowledge appropriate. Mood and affect  appropriate.  Cranial Nerves: Pupils equal, briskly reactive to light. Extraocular movements full without nystagmus. Visual fields full to confrontation. Hearing intact. Facial sensation intact. Face, tongue, palate moves normally and symmetrically.  Motor: Normal bulk and tone. Normal strength in all tested extremity muscles Sensory.: intact to touch , pinprick , position and vibratory sensation.  Coordination: Rapid alternating movements normal in all extremities. Finger-to-nose and heel-to-shin  performed accurately bilaterally. Gait and Station: Arises from chair without difficulty. Stance is normal. Gait demonstrates normal stride length and balance with ***. Tandem walk and heel toe ***.  Reflexes: 1+ and symmetric. Toes downgoing.         ASSESSMENT/PLAN: Brianna [name] is a 73 y.o. year old female      Chronic migraines :  Continue Vyepti  every 25-month infusion Continue Botox  every 3 months, next injection 11/25      Follow up in *** or call earlier if needed   CC:  PCP: Gerome Brunet, DO    I personally spent a total of *** minutes in the care of the patient today including {Time Based Coding:210964241}.     Brianna [name], Brianna [name]  [facility] [address] [place], [place] [address]  Phone [phone] Fax [phone] Note: This document was prepared with digital dictation and possible smart phrase technology. Any transcriptional errors that result from this process are unintentional.

## 2024-06-06 ENCOUNTER — Encounter: Payer: Self-pay | Admitting: Adult Health

## 2024-06-06 ENCOUNTER — Ambulatory Visit: Admitting: Adult Health

## 2024-06-06 VITALS — BP 135/75 | HR 90 | Ht 62.0 in | Wt 115.0 lb

## 2024-06-06 DIAGNOSIS — R200 Anesthesia of skin: Secondary | ICD-10-CM | POA: Diagnosis not present

## 2024-06-06 DIAGNOSIS — M5412 Radiculopathy, cervical region: Secondary | ICD-10-CM

## 2024-06-06 DIAGNOSIS — G43709 Chronic migraine without aura, not intractable, without status migrainosus: Secondary | ICD-10-CM | POA: Diagnosis not present

## 2024-06-06 NOTE — Patient Instructions (Addendum)
 Your Plan:  Continue Vyepti  300mg  infusion every 3 months   Continue botox  every 3 months  Continue Imitrex  as needed         Thank you for coming to see us  at [facility]. I hope we have been able to provide you high quality care today.  You may receive a patient satisfaction survey over the next few weeks. We would appreciate your feedback and comments so that we may continue to improve ourselves and the health of our patients.

## 2024-06-12 ENCOUNTER — Ambulatory Visit: Admitting: Adult Health

## 2024-06-12 VITALS — BP 139/85 | HR 93

## 2024-06-12 DIAGNOSIS — G43719 Chronic migraine without aura, intractable, without status migrainosus: Secondary | ICD-10-CM

## 2024-06-12 DIAGNOSIS — G43709 Chronic migraine without aura, not intractable, without status migrainosus: Secondary | ICD-10-CM | POA: Diagnosis not present

## 2024-06-12 MED ORDER — SUMATRIPTAN SUCCINATE 6 MG/0.5ML ~~LOC~~ SOLN: 6.0000 mg | SUBCUTANEOUS | 11 refills | Status: AC | PRN

## 2024-06-12 MED ORDER — ONABOTULINUMTOXINA 200 UNITS IJ SOLR
155.0000 [IU] | Freq: Once | INTRAMUSCULAR | Status: AC
  Administered 2024-06-12: 155 [IU] via INTRAMUSCULAR

## 2024-06-12 NOTE — Progress Notes (Signed)
 Update 06/12/2024 JM: Patient returns for repeat Botox .  Prior injection 03/20/2024.  Reports overall great benefit with use of Botox  with >50% reduction of migraines and further improvement of migraines on Vyepti  300 mg infusion every 3 months.  Reports about 2 migraines per month and 5-6 tension type headaches per month.  Uses sumatriptan  with benefit.  Tolerated procedure well today.  Return in 3 months for repeat injection      Consent Form Botulism Toxin Injection For Chronic Migraine    Reviewed orally with patient, additionally signature is on file:  Botulism toxin has been approved by the Federal drug administration for treatment of chronic migraine. Botulism toxin does not cure chronic migraine and it may not be effective in some patients.  The administration of botulism toxin is accomplished by injecting a small amount of toxin into the muscles of the neck and head. Dosage must be titrated for each individual. Any benefits resulting from botulism toxin tend to wear off after 3 months with a repeat injection required if benefit is to be maintained. Injections are usually done every 3-4 months with maximum effect peak achieved by about 2 or 3 weeks. Botulism toxin is expensive and you should be sure of what costs you will incur resulting from the injection.  The side effects of botulism toxin use for chronic migraine may include:   -Transient, and usually mild, facial weakness with facial injections  -Transient, and usually mild, head or neck weakness with head/neck injections  -Reduction or loss of forehead facial animation due to forehead muscle weakness  -Eyelid drooping  -Dry eye  -Pain at the site of injection or bruising at the site of injection  -Double vision  -Potential unknown long term risks   Contraindications: You should not have Botox  if you are pregnant, nursing, allergic to albumin, have an infection, skin condition, or muscle weakness at the site of the  injection, or have myasthenia gravis, Lambert-Eaton syndrome, or ALS.  It is also possible that as with any injection, there may be an allergic reaction or no effect from the medication. Reduced effectiveness after repeated injections is sometimes seen and rarely infection at the injection site may occur. All care will be taken to prevent these side effects. If therapy is given over a long time, atrophy and wasting in the muscle injected may occur. Occasionally the patient's become refractory to treatment because they develop antibodies to the toxin. In this event, therapy needs to be modified.  I have read the above information and consent to the administration of botulism toxin.    BOTOX  PROCEDURE NOTE FOR MIGRAINE HEADACHE  Contraindications and precautions discussed with patient(above). Aseptic procedure was observed and patient tolerated procedure. Procedure performed by Harlene Bogaert, AGNP-BC.   The condition has existed for more than 6 months, and pt does not have a diagnosis of ALS, Myasthenia Gravis or Lambert-Eaton Syndrome.  Risks and benefits of injections discussed and pt agrees to proceed with the procedure.  Written consent obtained  These injections are medically necessary. Pt  receives good benefits from these injections. These injections do not cause sedations or hallucinations which the oral therapies may cause.   Description of procedure:  The patient was placed in a sitting position. The standard protocol was used for Botox  as follows, with 5 units of Botox  injected at each site:   -Procerus muscle, midline injection  -Corrugator muscle, bilateral injection  -Frontalis muscle, bilateral injection, with 2 sites each side, medial injection was  performed in the upper one third of the frontalis muscle, in the region vertical from the medial inferior edge of the superior orbital rim. The lateral injection was again in the upper one third of the forehead vertically above the  lateral limbus of the cornea, 1.5 cm lateral to the medial injection site.  -Temporalis muscle injection, 4 sites, bilaterally. The first injection was 3 cm above the tragus of the ear, second injection site was 1.5 cm to 3 cm up from the first injection site in line with the tragus of the ear. The third injection site was 1.5-3 cm forward between the first 2 injection sites. The fourth injection site was 1.5 cm posterior to the second injection site. 5th site laterally in the temporalis  muscleat the level of the outer canthus.  -Occipitalis muscle injection, 3 sites, bilaterally. The first injection was done one half way between the occipital protuberance and the tip of the mastoid process behind the ear. The second injection site was done lateral and superior to the first, 1 fingerbreadth from the first injection. The third injection site was 1 fingerbreadth superiorly and medially from the first injection site.  -Cervical paraspinal muscle injection, 2 sites, bilaterally. The first injection site was 1 cm from the midline of the cervical spine, 3 cm inferior to the lower border of the occipital protuberance. The second injection site was 1.5 cm superiorly and laterally to the first injection site.  -Trapezius muscle injection was performed at 3 sites, bilaterally. The first injection site was in the upper trapezius muscle halfway between the inflection point of the neck, and the acromion. The second injection site was one half way between the acromion and the first injection site. The third injection was done between the first injection site and the inflection point of the neck.    A total of 200 units of Botox  was prepared, 140 units of Botox  was injected as documented above, any Botox  not injected was wasted. The patient tolerated the procedure well, there were no complications of the above procedure.   Harlene Bogaert, AGNP-BC  [facility] [address] [place], [place] [address]  Phone [phone] Fax [phone] Note: This document was prepared with digital dictation and possible smart phrase technology. Any transcriptional errors that result from this process are unintentional.

## 2024-06-12 NOTE — Progress Notes (Signed)
 Botox - 200 units x 1 vial Lot: [id number] Expiration: 6/27 NDC: 9976-6078-97  Bacteriostatic 0.9% Sodium Chloride - 4 mL  Lot: [id number] Expiration: 05/18/25 NDC: 9590-8033-97  Dx: H56.290 S/P  Witnessed by Stephania, CMA

## 2024-06-13 ENCOUNTER — Other Ambulatory Visit (HOSPITAL_COMMUNITY): Payer: Self-pay | Admitting: Neurological Surgery

## 2024-06-13 DIAGNOSIS — M5412 Radiculopathy, cervical region: Secondary | ICD-10-CM | POA: Diagnosis not present

## 2024-06-15 ENCOUNTER — Ambulatory Visit (HOSPITAL_COMMUNITY)
Admission: RE | Admit: 2024-06-15 | Discharge: 2024-06-15 | Disposition: A | Source: Ambulatory Visit | Attending: Neurological Surgery | Admitting: Neurological Surgery

## 2024-06-15 DIAGNOSIS — M5412 Radiculopathy, cervical region: Secondary | ICD-10-CM | POA: Insufficient documentation

## 2024-06-15 DIAGNOSIS — M47812 Spondylosis without myelopathy or radiculopathy, cervical region: Secondary | ICD-10-CM | POA: Diagnosis not present

## 2024-06-15 DIAGNOSIS — M4803 Spinal stenosis, cervicothoracic region: Secondary | ICD-10-CM | POA: Diagnosis not present

## 2024-06-15 DIAGNOSIS — M4319 Spondylolisthesis, multiple sites in spine: Secondary | ICD-10-CM | POA: Diagnosis not present

## 2024-06-15 DIAGNOSIS — M4802 Spinal stenosis, cervical region: Secondary | ICD-10-CM | POA: Diagnosis not present

## 2024-06-21 DIAGNOSIS — M5412 Radiculopathy, cervical region: Secondary | ICD-10-CM | POA: Diagnosis not present

## 2024-06-26 ENCOUNTER — Ambulatory Visit: Admitting: Neurology

## 2024-06-28 DIAGNOSIS — G5623 Lesion of ulnar nerve, bilateral upper limbs: Secondary | ICD-10-CM | POA: Diagnosis not present

## 2024-06-28 DIAGNOSIS — M5412 Radiculopathy, cervical region: Secondary | ICD-10-CM | POA: Diagnosis not present

## 2024-06-29 ENCOUNTER — Ambulatory Visit: Admitting: Physical Therapy

## 2024-06-29 ENCOUNTER — Ambulatory Visit

## 2024-06-29 DIAGNOSIS — R2681 Unsteadiness on feet: Secondary | ICD-10-CM | POA: Diagnosis present

## 2024-06-29 DIAGNOSIS — H8113 Benign paroxysmal vertigo, bilateral: Secondary | ICD-10-CM

## 2024-06-29 DIAGNOSIS — R262 Difficulty in walking, not elsewhere classified: Secondary | ICD-10-CM | POA: Insufficient documentation

## 2024-06-29 NOTE — Patient Instructions (Signed)
Rolling    With pillow under head, start on back. Roll slowly to right. Hold position until symptoms subside. Roll slowly onto left side. Hold position until symptoms subside. Repeat sequence _5___ times per session. Do _3-5___ sessions per day.  Copyright  VHI. All rights reserved.   

## 2024-06-29 NOTE — Therapy (Signed)
 OUTPATIENT PHYSICAL THERAPY VESTIBULAR EVALUATION     Patient Name: Brianna [name] MRN: [id number] DOB:1950/08/01, 73 y.o., female Today's Date: 07/01/2024  END OF SESSION:  PT End of Session - 07/01/24 1424     Visit Number 1    Number of Visits 5    Date for Recertification  07/27/24    Authorization Type [employer]    Authorization Time Period 06-29-24 - 08-18-24    PT Start Time 0928    PT Stop Time 1016    PT Time Calculation (min) 48 min    Activity Tolerance Other (comment)   limited by dizziness/nausea   Behavior During Therapy WFL for tasks assessed/performed          Past Medical History:  Diagnosis Date   Arthritis    in the neck   Carotid artery occlusion    Epilepsy (HCC)    GERD (gastroesophageal reflux disease)    Glaucoma    Hyperlipidemia    Hypertension    Keratoconus of both eyes 1981   Macular degeneration    Migraines    Peripheral vascular disease    carotid occlusion surgery on left   Raynaud's disease    Retinal edema    Rheumatoid arthritis(714.0)    Stroke (HCC)    two mild strokes presumed from left carotid stenosis   Past Surgical History:  Procedure Laterality Date   CAROTID ARTERY ANGIOPLASTY  2008   Dr Dyane   CAROTID ENDARTERECTOMY Left 11/15/2007   cataract extraction Right 04/2014   [facility]   CHOLECYSTECTOMY  1995   CORNEAL TRANSPLANT     x 5 ; steroid inj. 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   [facility]   CORNEAL TRANSPLANT Left 04/17/2019   CORNEAL TRANSPLANT Right 01/25/2024   EYE SURGERY Right 12/2016   cornea repair    EYE SURGERY Right 09/20/2017   cornea transplant    GLAUCOMA SURGERY Right 2018   ORIF PATELLA Left 02/12/2021   Procedure: OPEN REDUCTION INTERNAL (ORIF) FIXATION PATELLA;  Surgeon: Sharl Selinda Dover, MD;  Location: [facility] OR;  Service: Orthopedics;  Laterality: Left;   TRIGGER FINGER RELEASE Right 05/16/2018   Procedure: RELEASE TRIGGER FINGER/A-1 PULLEY RIGHT THUMB;   Surgeon: Murrell Kuba, MD;  Location: [facility];  Service: Orthopedics;  Laterality: Right;   Patient Active Problem List   Diagnosis Date Noted   Iron deficiency 12/07/2023   Other fatigue 12/07/2023   Nocturnal hypoxemia 12/07/2023   Gastroesophageal reflux disease without esophagitis 03/24/2022   Altered taste 03/24/2022   Primary osteoarthritis of first carpometacarpal joint of left hand 12/18/2021   Coronary artery disease involving native coronary artery of native heart without angina pectoris 11/23/2021   Rhinitis, chronic 09/23/2021   Closed fracture of left patella 06/22/2021   Low back pain 05/28/2021   Displacement of lumbar intervertebral disc without myelopathy 05/28/2021   Episodic cluster headache, not intractable 03/24/2021   Morning headache 03/24/2021   Cerebrovascular accident (CVA) (HCC) 03/24/2021   Chronic migraine without aura, intractable, without status migrainosus 03/24/2021   Patellar fracture 02/11/2021   Dyslipidemia 02/11/2021   Pain of left thumb 02/02/2021   Anxiety 08/28/2020   Carotid artery disease 08/28/2020   Late effects of cerebrovascular disease 08/28/2020   Macular degeneration 08/28/2020   Other long term (current) drug therapy 08/28/2020   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   Pure hypercholesterolemia 08/28/2020   Vitamin D  deficiency 08/28/2020   Exertional chest pain 06/24/2020  Hypertension 05/12/2020   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   Acute urinary tract infection 11/13/2019   Epilepsy (HCC) 11/13/2019   Corneal ulcer of left eye 12/05/2018   Acute left eye pain 12/05/2018   Endophthalmitis, acute, left 12/05/2018   Ulcer of left cornea 12/05/2018   URI, acute 05/19/2018   Cerumen impaction 05/19/2018   Primary open angle glaucoma (POAG) of left eye, severe stage 02/16/2018   Laceration of left middle finger without foreign body without damage to nail 11/23/2017   Trigger thumb,  right thumb 10/05/2017   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   Numbness 04/25/2017   Pain 04/25/2017   Primary open angle glaucoma (POAG) of right eye, moderate stage 04/15/2017   Bilateral impacted cerumen 01/31/2017   History of macular degeneration 09/13/2016   History of migraine 09/06/2016   History of seizures 09/06/2016   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   Secondary corneal edema of right eye 06/28/2016   Lumbar radiculopathy 05/24/2016   Degeneration of lumbosacral intervertebral disc 05/24/2016   Iritis of right eye 05/12/2016   Rheumatoid arthritis with positive rheumatoid factor (HCC) 05/11/2016   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   High risk medication use 05/11/2016   Osteoporosis 05/11/2016   ILD (interstitial lung disease) (HCC) 05/11/2016   Intractable chronic migraine without aura and without status migrainosus 04/02/2016   Intractable chronic migraine without aura 04/02/2016   Peripheral focal chorioretinal inflammation of both eyes 01/04/2016   Squamous blepharitis of upper and lower eyelids of both eyes 10/22/2015   Dry eyes, bilateral 10/21/2015   History of high risk medication treatment 04/28/2015   Cystoid macular edema of left eye 09/23/2014   History of carotid endarterectomy 06/27/2014   Acquired myogenic ptosis of both eyelids 06/07/2014   Myogenic ptosis, acquired 06/07/2014   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   Migraine without aura 04/19/2013   Seizures (HCC) 04/19/2013   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   Keratoconus, 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   Minor corneal opacity 08/02/2011   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   Cough 01/14/2011   Pulmonary fibrosis (HCC) 01/14/2011    PCP: Gerome Brunet, DO REFERRING PROVIDER: Joshua Alm Hamilton, MD  REFERRING DIAG:  Diagnosis  H81.13 (ICD-10-CM) - Benign paroxysmal vertigo, bilateral    THERAPY DIAG:  BPPV (benign paroxysmal positional vertigo), bilateral  Difficulty in walking, not elsewhere  classified  Unsteadiness on feet  ONSET DATE: mid. November 2025  Rationale for Evaluation and Treatment: Rehabilitation  SUBJECTIVE:   SUBJECTIVE STATEMENT: Pt reports dizziness started in Oct. 2025 Pt reports she got up during night to go to bathroom and the room started spinning; lied back down in bed and it started spinning again; is taking Dramamine (1 tablet) in the morning and 1 at night; says it happens when she is least prepared for it; sits on side of bed for a short while - does not roll over and get up immediately; pt reports she has fallen twice since episode started. 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 Knew it was vertigo - had episodes of vertigo when she was younger but says this is different. Now recognizes not to get up fast.  Did not take Dramamine this morning.  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 Pain in bil. Shoulders; has to sleep on her back  PRECAUTIONS: Fall, dizziness  RED FLAGS: None   WEIGHT BEARING RESTRICTIONS: No  FALLS: Has patient fallen in last 6 months? No  LIVING ENVIRONMENT: Lives with: lives with their spouse Lives in: House/apartment  PLOF: Independent  PATIENT GOALS: resolve the vertigo  OBJECTIVE:  Note: Objective measures were completed at Evaluation unless otherwise noted.  DIAGNOSTIC FINDINGS:  IMPRESSION: 1. 5 mm facet mediated anterolisthesis of C2 on C3 with associated disc bulge and facet arthrosis, resulting in moderate spinal stenosis, with moderate right C3 foraminal narrowing. Mild cord flattening with subtle hazy cord signal changes at this level, suspicious  for myelomalacia. Appearance is similar to prior. 2. Additional multilevel cervical spondylosis with resultant mild spinal stenosis at C3-4 through C5-6. 3. Multifactorial degenerative changes with resultant multilevel foraminal narrowing as above. Notable findings include severe right with moderate left C4 and C5 foraminal stenosis, severe right with moderate left C6 foraminal narrowing, with moderate bilateral C8 foraminal stenosis.  COGNITION: Overall cognitive status: Within functional limits for tasks assessed    GAIT: Gait pattern: [facility] Distance walked: 68' Assistive device utilized: None Level of assistance: Modified independence Comments: HHA needed after Epley maneuver due to unsteadiness and c/o dizziness as BPPV not resolved  VESTIBULAR ASSESSMENT:  GENERAL OBSERVATION: Pt amb. Without device to evaluation, accompanied by her husband.  Pt reports dizziness started in Oct. 2025.  Says she gets dizzy when she gets up and down off floor   SYMPTOM BEHAVIOR:  Subjective history: started in Oct. 2025; rolling provokes dizziness as well as getting up/down off floor  Non-Vestibular symptoms: neck pain, nausea/vomiting, and migraine symptoms  Type of dizziness: Spinning/Vertigo  Frequency: varies - usually daily  Duration: minutes  Aggravating factors: Induced by position change: rolling to the right, rolling to the left, supine to sit, and bending down and getting up  Relieving factors: head stationary  Progression of symptoms: unchanged  POSITIONAL TESTING: Right Dix-Hallpike: upbeating, right nystagmus Right Sidelying: upbeating, right nystagmus Left Sidelying: upbeating, left nystagmus  MOTION SENSITIVITY:  Motion Sensitivity Quotient Intensity: 0 = none, 1 = Lightheaded, 2 = Mild, 3 = Moderate, 4 = Severe, 5 = Vomiting  Intensity  1. Sitting to supine   2. Supine to L side   3. Supine to R side   4. Supine to sitting   5. L Hallpike-Dix   6. Up from L    7. R  Hallpike-Dix   8. Up from R    9. Sitting, head tipped to L knee   10. Head up from L knee   11. Sitting, head tipped to R knee   12. Head up from R knee   13. Sitting head turns x5   14.Sitting head nods x5   15. In stance, 180 turn to L    16. In stance, 180 turn to R  TREATMENT DATE: 06-29-24   Canalith Repositioning:  Epley Right: Number of Reps: 3, Response to Treatment: symptoms worsened/converted, and Comment: converted to horizontal canalithiasis on 3rd rep    PATIENT EDUCATION: Education details: article on BPPV etiology given to patient; educated pt and husband in etiology of BPPV with need to stay well hydrated; instructed in rolling for habituation (rolling for self treatment of Rt BPPV horizontal canalithiasis) Person educated: Patient and Spouse Education method: Explanation, Demonstration, and Handouts Education comprehension: verbalized understanding and returned demonstration  HOME EXERCISE PROGRAM: 06-29-24: Rolling    With pillow under head, start on back. Roll slowly to right. Hold position until symptoms subside. Roll slowly onto left side. Hold position until symptoms subside. Repeat sequence __5__ times per session. Do __3-5__ sessions per day.    GOALS: Goals reviewed with patient? Yes  SHORT TERM GOALS: SAME as LTG's   LONG TERM GOALS: Target date: 07-27-24  Pt will have (-) Rt and Lt Dix-Hallpike test and also (-) Rt horizontal roll test to indicate resolution of multi-canal BPPV. Baseline: (+) Rt Dix-Hallpike test, (+) Lt sidelying test and (+) Rt horizontal roll test Goal status: INITIAL  2.  Pt will perform all bed mobility with no c/o vertigo. Baseline:  Goal status: INITIAL  3.  Pt will report no dizziness with bending over/returning to upright and also no dizziness with transfers. Baseline:  Goal status:  INITIAL  4.  Independent in Epley maneuver and/or Brandt-Daroff exercises for self treatment prn. Baseline:  Goal status: INITIAL  5.  Independent in HEP for habituation and balance exercises. Baseline:  Goal status: INITIAL  ASSESSMENT:  CLINICAL IMPRESSION: Patient is a 73 y.o. lady who was seen today for physical therapy evaluation and treatment for multi-canal BPPV.  Pt had (+) Rt Dix-Hallpike test and (+) Lt sidelying test with rotary upbeating nystagmus and c/o vertigo in each test position. Pt was treated with 3 reps of Epley for Rt BPPV posterior canalithiasis and it converted to horizontal canalithiasis.  Pt became very symptomatic so treatment was stopped due to pt not feeling well. Pt was instructed in rolling for habituation for treatment of Rt horizontal canalithiasis.  Pt will benefit from PT to continue to assess and treat multi-canal BPPV.     OBJECTIVE IMPAIRMENTS: decreased balance, difficulty walking, and dizziness.   ACTIVITY LIMITATIONS: bending, transfers, bed mobility, and locomotion level  PARTICIPATION LIMITATIONS: meal prep, cleaning, laundry, driving, community activity, and volunteer work in pediatrics at hospital  PERSONAL FACTORS: 1-2 comorbidities: h/o migraines, CVA, RA are also affecting patient's functional outcome.   REHAB POTENTIAL: Excellent  CLINICAL DECISION MAKING: Evolving/moderate complexity  EVALUATION COMPLEXITY: Moderate   PLAN:  PT FREQUENCY: 1x/week  PT DURATION: 4 weeks + eval  PLANNED INTERVENTIONS: 97110-Therapeutic exercises, 97530- Therapeutic activity, [id number]- Neuromuscular re-education, [phone]- Self Care, [id number]- Gait training, [phone]- Canalith repositioning, and Patient/Family education  PLAN FOR NEXT SESSION: recheck Rt horizontal canal; Rt BPPV; treat prn   Abeeha Twist, [place], PT 07/01/2024, 2:28 PM

## 2024-07-01 ENCOUNTER — Encounter: Payer: Self-pay | Admitting: Physical Therapy

## 2024-07-03 ENCOUNTER — Ambulatory Visit: Admitting: Physical Therapy

## 2024-07-03 ENCOUNTER — Encounter: Payer: Self-pay | Admitting: Physical Therapy

## 2024-07-03 DIAGNOSIS — R262 Difficulty in walking, not elsewhere classified: Secondary | ICD-10-CM

## 2024-07-03 DIAGNOSIS — H8113 Benign paroxysmal vertigo, bilateral: Secondary | ICD-10-CM | POA: Diagnosis not present

## 2024-07-03 NOTE — Patient Instructions (Signed)
Sit to Side-Lying    Sit on edge of bed. 1. Turn head 45 to right. 2. Maintain head position and lie down slowly on left side. Hold until symptoms subside. 3. Sit up slowly. Hold until symptoms subside. 4. Turn head 45 to left. 5. Maintain head position and lie down slowly on right side. Hold until symptoms subside. 6. Sit up slowly. Repeat sequence _5___ times per session. Do __3__ sessions per day.  Copyright  VHI. All rights reserved.   

## 2024-07-03 NOTE — Therapy (Signed)
 OUTPATIENT PHYSICAL THERAPY VESTIBULAR EVALUATION     Patient Name: Brianna [name] MRN: [id number] DOB:11/24/1950, 73 y.o., female Today's Date: 07/03/2024  END OF SESSION:  PT End of Session - 07/03/24 2028     Visit Number 2    Number of Visits 5    Date for Recertification  07/27/24    Authorization Type [employer]    Authorization Time Period 06-29-24 - 08-18-24    PT Start Time 1320    PT Stop Time 1405    PT Time Calculation (min) 45 min    Activity Tolerance Patient tolerated treatment well   limited by dizziness/nausea   Behavior During Therapy WFL for tasks assessed/performed           Past Medical History:  Diagnosis Date   Arthritis    in the neck   Carotid artery occlusion    Epilepsy (HCC)    GERD (gastroesophageal reflux disease)    Glaucoma    Hyperlipidemia    Hypertension    Keratoconus of both eyes 1981   Macular degeneration    Migraines    Peripheral vascular disease    carotid occlusion surgery on left   Raynaud's disease    Retinal edema    Rheumatoid arthritis(714.0)    Stroke (HCC)    two mild strokes presumed from left carotid stenosis   Past Surgical History:  Procedure Laterality Date   CAROTID ARTERY ANGIOPLASTY  2008   Dr Dyane   CAROTID ENDARTERECTOMY Left 11/15/2007   cataract extraction Right 04/2014   [facility]   CHOLECYSTECTOMY  1995   CORNEAL TRANSPLANT     x 5 ; steroid inj. 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   [facility]   CORNEAL TRANSPLANT Left 04/17/2019   CORNEAL TRANSPLANT Right 01/25/2024   EYE SURGERY Right 12/2016   cornea repair    EYE SURGERY Right 09/20/2017   cornea transplant    GLAUCOMA SURGERY Right 2018   ORIF PATELLA Left 02/12/2021   Procedure: OPEN REDUCTION INTERNAL (ORIF) FIXATION PATELLA;  Surgeon: Sharl Selinda Dover, MD;  Location: [facility] OR;  Service: Orthopedics;  Laterality: Left;   TRIGGER FINGER RELEASE Right 05/16/2018   Procedure: RELEASE TRIGGER FINGER/A-1  PULLEY RIGHT THUMB;  Surgeon: Murrell Kuba, MD;  Location: [facility];  Service: Orthopedics;  Laterality: Right;   Patient Active Problem List   Diagnosis Date Noted   Iron deficiency 12/07/2023   Other fatigue 12/07/2023   Nocturnal hypoxemia 12/07/2023   Gastroesophageal reflux disease without esophagitis 03/24/2022   Altered taste 03/24/2022   Primary osteoarthritis of first carpometacarpal joint of left hand 12/18/2021   Coronary artery disease involving native coronary artery of native heart without angina pectoris 11/23/2021   Rhinitis, chronic 09/23/2021   Closed fracture of left patella 06/22/2021   Low back pain 05/28/2021   Displacement of lumbar intervertebral disc without myelopathy 05/28/2021   Episodic cluster headache, not intractable 03/24/2021   Morning headache 03/24/2021   Cerebrovascular accident (CVA) (HCC) 03/24/2021   Chronic migraine without aura, intractable, without status migrainosus 03/24/2021   Patellar fracture 02/11/2021   Dyslipidemia 02/11/2021   Pain of left thumb 02/02/2021   Anxiety 08/28/2020   Carotid artery disease 08/28/2020   Late effects of cerebrovascular disease 08/28/2020   Macular degeneration 08/28/2020   Other long term (current) drug therapy 08/28/2020   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   Pure hypercholesterolemia 08/28/2020   Vitamin D  deficiency 08/28/2020   Exertional chest pain  06/24/2020   Hypertension 05/12/2020   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   Acute urinary tract infection 11/13/2019   Epilepsy (HCC) 11/13/2019   Corneal ulcer of left eye 12/05/2018   Acute left eye pain 12/05/2018   Endophthalmitis, acute, left 12/05/2018   Ulcer of left cornea 12/05/2018   URI, acute 05/19/2018   Cerumen impaction 05/19/2018   Primary open angle glaucoma (POAG) of left eye, severe stage 02/16/2018   Laceration of left middle finger without foreign body without damage to nail 11/23/2017    Trigger thumb, right thumb 10/05/2017   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   Numbness 04/25/2017   Pain 04/25/2017   Primary open angle glaucoma (POAG) of right eye, moderate stage 04/15/2017   Bilateral impacted cerumen 01/31/2017   History of macular degeneration 09/13/2016   History of migraine 09/06/2016   History of seizures 09/06/2016   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   Secondary corneal edema of right eye 06/28/2016   Lumbar radiculopathy 05/24/2016   Degeneration of lumbosacral intervertebral disc 05/24/2016   Iritis of right eye 05/12/2016   Rheumatoid arthritis with positive rheumatoid factor (HCC) 05/11/2016   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   High risk medication use 05/11/2016   Osteoporosis 05/11/2016   ILD (interstitial lung disease) (HCC) 05/11/2016   Intractable chronic migraine without aura and without status migrainosus 04/02/2016   Intractable chronic migraine without aura 04/02/2016   Peripheral focal chorioretinal inflammation of both eyes 01/04/2016   Squamous blepharitis of upper and lower eyelids of both eyes 10/22/2015   Dry eyes, bilateral 10/21/2015   History of high risk medication treatment 04/28/2015   Cystoid macular edema of left eye 09/23/2014   History of carotid endarterectomy 06/27/2014   Acquired myogenic ptosis of both eyelids 06/07/2014   Myogenic ptosis, acquired 06/07/2014   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   Migraine without aura 04/19/2013   Seizures (HCC) 04/19/2013   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   Keratoconus, 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   Minor corneal opacity 08/02/2011   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   Cough 01/14/2011   Pulmonary fibrosis (HCC) 01/14/2011    PCP: Gerome Brunet, DO REFERRING PROVIDER: Joshua Alm Hamilton, MD  REFERRING DIAG:  Diagnosis  H81.13 (ICD-10-CM) - Benign paroxysmal vertigo, bilateral    THERAPY DIAG:  BPPV (benign paroxysmal positional vertigo), bilateral  Difficulty in walking,  not elsewhere classified  ONSET DATE: mid. November 2025  Rationale for Evaluation and Treatment: Rehabilitation  SUBJECTIVE:   SUBJECTIVE STATEMENT: Pt reports Saturday she had a lot of spin doing the rolling exercise; Sunday had a little spin when she did the exercise; has not done the rolling exercise today. Pt reports she is doing an extra step to regain balance when she walks. 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 Pain in bil. Shoulders; has to sleep on her back  PRECAUTIONS: Fall, dizziness  RED FLAGS: None   WEIGHT BEARING RESTRICTIONS: No  FALLS: Has patient fallen in last 6 months? No  LIVING ENVIRONMENT: Lives with: lives with their spouse Lives in: House/apartment  PLOF: Independent  PATIENT GOALS: resolve the vertigo  OBJECTIVE:  Note: Objective measures were completed at Evaluation unless otherwise noted.  DIAGNOSTIC FINDINGS:  IMPRESSION: 1. 5 mm facet mediated anterolisthesis of C2 on C3  with associated disc bulge and facet arthrosis, resulting in moderate spinal stenosis, with moderate right C3 foraminal narrowing. Mild cord flattening with subtle hazy cord signal changes at this level, suspicious for myelomalacia. Appearance is similar to prior. 2. Additional multilevel cervical spondylosis with resultant mild spinal stenosis at C3-4 through C5-6. 3. Multifactorial degenerative changes with resultant multilevel foraminal narrowing as above. Notable findings include severe right with moderate left C4 and C5 foraminal stenosis, severe right with moderate left C6 foraminal narrowing, with moderate bilateral C8 foraminal stenosis.  COGNITION: Overall cognitive status: Within functional limits for tasks  assessed    GAIT: Gait pattern: [facility] Distance walked: 84' Assistive device utilized: None Level of assistance: Modified independence Comments: HHA needed after Epley maneuver due to unsteadiness and c/o dizziness as BPPV not resolved  VESTIBULAR ASSESSMENT:  GENERAL OBSERVATION: Pt amb. Without device to evaluation, accompanied by her husband.  Pt reports dizziness started in Oct. 2025.  Says she gets dizzy when she gets up and down off floor   SYMPTOM BEHAVIOR:  Subjective history: started in Oct. 2025; rolling provokes dizziness as well as getting up/down off floor  Non-Vestibular symptoms: neck pain, nausea/vomiting, and migraine symptoms  Type of dizziness: Spinning/Vertigo  Frequency: varies - usually daily  Duration: minutes  Aggravating factors: Induced by position change: rolling to the right, rolling to the left, supine to sit, and bending down and getting up  Relieving factors: head stationary  Progression of symptoms: unchanged  POSITIONAL TESTING: Right Dix-Hallpike: upbeating, right nystagmus Right Sidelying: upbeating, right nystagmus Left Sidelying: upbeating, left nystagmus  MOTION SENSITIVITY:  Motion Sensitivity Quotient Intensity: 0 = none, 1 = Lightheaded, 2 = Mild, 3 = Moderate, 4 = Severe, 5 = Vomiting  Intensity  1. Sitting to supine   2. Supine to L side   3. Supine to R side   4. Supine to sitting   5. L Hallpike-Dix   6. Up from L    7. R Hallpike-Dix   8. Up from R    9. Sitting, head tipped to L knee   10. Head up from L knee   11. Sitting, head tipped to R knee   12. Head up from R knee   13. Sitting head turns x5   14.Sitting head nods x5   15. In stance, 180 turn to L    16. In stance, 180 turn to R                                                                                                                               TREATMENT DATE: 07-02-24  TherAct: Sit to supine - no c/o vertigo and no nystagmus Rolling - supine to Rt  sidelying = no nystagmus & no c/o vertigo               Supine to Lt sidelying - Lt rotary upbeating nystagmus with c/o vertigo  Lt horizontal roll test -  negative - no nystagmus and no c/o vertigo Rt horizontal roll test - negative - no nystagmus and no c/o vertigo  Rt Dix-Hallpike test (-) with no nystagmus and no c/o vertigo  Lt Dix-Hallpike test (+) with Lt rotary upbeating nystagmus and c/o vertigo   Canalith Repositioning:  Epley maneuver for Lt BPPV - 3 reps; symptoms improved on each rep;  Self Care:  Pt instructed in Brandt-Daroff exercise for HEP for self treatment Lt BPPV     PATIENT EDUCATION: Education details: article on BPPV etiology given to patient; educated pt and husband in etiology of BPPV with need to stay well hydrated; instructed in rolling for habituation (rolling for self treatment of Rt BPPV horizontal canalithiasis) Person educated: Patient and Spouse Education method: Explanation, Demonstration, and Handouts Education comprehension: verbalized understanding and returned demonstration  HOME EXERCISE PROGRAM: 07-02-24:  Brandt-Daroff exercise for Lt BPPV    06-29-24: Rolling    With pillow under head, start on back. Roll slowly to right. Hold position until symptoms subside. Roll slowly onto left side. Hold position until symptoms subside. Repeat sequence __5__ times per session. Do __3-5__ sessions per day.    GOALS: Goals reviewed with patient? Yes  SHORT TERM GOALS: SAME as LTG's   LONG TERM GOALS: Target date: 07-27-24  Pt will have (-) Rt and Lt Dix-Hallpike test and also (-) Rt horizontal roll test to indicate resolution of multi-canal BPPV. Baseline: (+) Rt Dix-Hallpike test, (+) Lt sidelying test and (+) Rt horizontal roll test Goal status: INITIAL  2.  Pt will perform all bed mobility with no c/o vertigo. Baseline:  Goal status: INITIAL  3.  Pt will report no dizziness with bending over/returning to upright and also no  dizziness with transfers. Baseline:  Goal status: INITIAL  4.  Independent in Epley maneuver and/or Brandt-Daroff exercises for self treatment prn. Baseline:  Goal status: INITIAL  5.  Independent in HEP for habituation and balance exercises. Baseline:  Goal status: INITIAL  ASSESSMENT:  CLINICAL IMPRESSION: Pt had (-) Rt and Lt horizontal roll test in today's session, indicative of resolution of Rt horizontal canalithiasis (after conversion from Rt posterior canal to horizontal canal in previous session on 06-29-24.  Pt had Lt nystagmus in Lt sidelying position with c/o vertigo, indicative of Lt posterior canalithiasis. Pt was treated with 3 reps of Epley maneuver for Lt BPPV and symptoms improved with each subsequent rep.  Will cont to assess and treat prn.    OBJECTIVE IMPAIRMENTS: decreased balance, difficulty walking, and dizziness.   ACTIVITY LIMITATIONS: bending, transfers, bed mobility, and locomotion level  PARTICIPATION LIMITATIONS: meal prep, cleaning, laundry, driving, community activity, and volunteer work in pediatrics at hospital  PERSONAL FACTORS: 1-2 comorbidities: h/o migraines, CVA, RA are also affecting patient's functional outcome.   REHAB POTENTIAL: Excellent  CLINICAL DECISION MAKING: Evolving/moderate complexity  EVALUATION COMPLEXITY: Moderate   PLAN:  PT FREQUENCY: 1x/week  PT DURATION: 4 weeks + eval  PLANNED INTERVENTIONS: 97110-Therapeutic exercises, 97530- Therapeutic activity, [id number]- Neuromuscular re-education, [id number]- Self Care, [id number]- Gait training, [id number]- Canalith repositioning, and Patient/Family education  PLAN FOR NEXT SESSION: recheck Lt BPPV; treat prn   Nellene Courtois, [place], PT 07/03/2024, 8:32 PM

## 2024-07-23 DIAGNOSIS — E785 Hyperlipidemia, unspecified: Secondary | ICD-10-CM

## 2024-07-23 DIAGNOSIS — I2510 Atherosclerotic heart disease of native coronary artery without angina pectoris: Secondary | ICD-10-CM

## 2024-07-24 MED ORDER — REPATHA SURECLICK 140 MG/ML ~~LOC~~ SOAJ: 140.0000 mg | SUBCUTANEOUS | 1 refills | Status: AC

## 2024-07-31 ENCOUNTER — Other Ambulatory Visit: Payer: Self-pay

## 2024-07-31 DIAGNOSIS — M069 Rheumatoid arthritis, unspecified: Secondary | ICD-10-CM

## 2024-07-31 DIAGNOSIS — M4312 Spondylolisthesis, cervical region: Secondary | ICD-10-CM

## 2024-08-06 ENCOUNTER — Ambulatory Visit: Attending: Neurological Surgery | Admitting: Physical Therapy

## 2024-08-06 ENCOUNTER — Encounter: Payer: Self-pay | Admitting: Physical Therapy

## 2024-08-06 DIAGNOSIS — H8113 Benign paroxysmal vertigo, bilateral: Secondary | ICD-10-CM | POA: Diagnosis present

## 2024-08-06 DIAGNOSIS — R2681 Unsteadiness on feet: Secondary | ICD-10-CM | POA: Diagnosis present

## 2024-08-06 NOTE — Therapy (Signed)
 " OUTPATIENT PHYSICAL THERAPY VESTIBULAR TREATMENT NOTE     Patient Name: Brianna [name] MRN: [id number] DOB:01-31-1951, 74 y.o., female Today's Date: 08/06/2024  END OF SESSION:  PT End of Session - 08/06/24 1050     Visit Number 3    Number of Visits 5    Date for Recertification  07/27/24    Authorization Type [employer]    Authorization Time Period 06-29-24 - 08-18-24    PT Start Time 0847    PT Stop Time 0933    PT Time Calculation (min) 46 min    Activity Tolerance Patient tolerated treatment well   limited by dizziness/nausea   Behavior During Therapy [facility] for tasks assessed/performed            Past Medical History:  Diagnosis Date   Arthritis    in the neck   Carotid artery occlusion    Epilepsy (HCC)    GERD (gastroesophageal reflux disease)    Glaucoma    Hyperlipidemia    Hypertension    Keratoconus of both eyes 1981   Macular degeneration    Migraines    Peripheral vascular disease    carotid occlusion surgery on left   Raynaud's disease    Retinal edema    Rheumatoid arthritis(714.0)    Stroke (HCC)    two mild strokes presumed from left carotid stenosis   Past Surgical History:  Procedure Laterality Date   CAROTID ARTERY ANGIOPLASTY  2008   Dr Dyane   CAROTID ENDARTERECTOMY Left 11/15/2007   cataract extraction Right 04/2014   [facility]   CHOLECYSTECTOMY  1995   CORNEAL TRANSPLANT     x 5 ; steroid inj. 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   [facility]   CORNEAL TRANSPLANT Left 04/17/2019   CORNEAL TRANSPLANT Right 01/25/2024   EYE SURGERY Right 12/2016   cornea repair    EYE SURGERY Right 09/20/2017   cornea transplant    GLAUCOMA SURGERY Right 2018   ORIF PATELLA Left 02/12/2021   Procedure: OPEN REDUCTION INTERNAL (ORIF) FIXATION PATELLA;  Surgeon: Sharl Selinda Dover, MD;  Location: [facility] OR;  Service: Orthopedics;  Laterality: Left;   TRIGGER FINGER RELEASE Right 05/16/2018   Procedure: RELEASE TRIGGER  FINGER/A-1 PULLEY RIGHT THUMB;  Surgeon: Murrell Kuba, MD;  Location: [facility];  Service: Orthopedics;  Laterality: Right;   Patient Active Problem List   Diagnosis Date Noted   Iron deficiency 12/07/2023   Other fatigue 12/07/2023   Nocturnal hypoxemia 12/07/2023   Gastroesophageal reflux disease without esophagitis 03/24/2022   Altered taste 03/24/2022   Primary osteoarthritis of first carpometacarpal joint of left hand 12/18/2021   Coronary artery disease involving native coronary artery of native heart without angina pectoris 11/23/2021   Rhinitis, chronic 09/23/2021   Closed fracture of left patella 06/22/2021   Low back pain 05/28/2021   Displacement of lumbar intervertebral disc without myelopathy 05/28/2021   Episodic cluster headache, not intractable 03/24/2021   Morning headache 03/24/2021   Cerebrovascular accident (CVA) (HCC) 03/24/2021   Chronic migraine without aura, intractable, without status migrainosus 03/24/2021   Patellar fracture 02/11/2021   Dyslipidemia 02/11/2021   Pain of left thumb 02/02/2021   Anxiety 08/28/2020   Carotid artery disease 08/28/2020   Late effects of cerebrovascular disease 08/28/2020   Macular degeneration 08/28/2020   Other long term (current) drug therapy 08/28/2020   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   Pure hypercholesterolemia 08/28/2020   Vitamin D  deficiency 08/28/2020  Exertional chest pain 06/24/2020   Hypertension 05/12/2020   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   Acute urinary tract infection 11/13/2019   Epilepsy (HCC) 11/13/2019   Corneal ulcer of left eye 12/05/2018   Acute left eye pain 12/05/2018   Endophthalmitis, acute, left 12/05/2018   Ulcer of left cornea 12/05/2018   URI, acute 05/19/2018   Cerumen impaction 05/19/2018   Primary open angle glaucoma (POAG) of left eye, severe stage 02/16/2018   Laceration of left middle finger without foreign body without damage to nail  11/23/2017   Trigger thumb, right thumb 10/05/2017   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   Numbness 04/25/2017   Pain 04/25/2017   Primary open angle glaucoma (POAG) of right eye, moderate stage 04/15/2017   Bilateral impacted cerumen 01/31/2017   History of macular degeneration 09/13/2016   History of migraine 09/06/2016   History of seizures 09/06/2016   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   Secondary corneal edema of right eye 06/28/2016   Lumbar radiculopathy 05/24/2016   Degeneration of lumbosacral intervertebral disc 05/24/2016   Iritis of right eye 05/12/2016   Rheumatoid arthritis with positive rheumatoid factor (HCC) 05/11/2016   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   High risk medication use 05/11/2016   Osteoporosis 05/11/2016   ILD (interstitial lung disease) (HCC) 05/11/2016   Intractable chronic migraine without aura and without status migrainosus 04/02/2016   Intractable chronic migraine without aura 04/02/2016   Peripheral focal chorioretinal inflammation of both eyes 01/04/2016   Squamous blepharitis of upper and lower eyelids of both eyes 10/22/2015   Dry eyes, bilateral 10/21/2015   History of high risk medication treatment 04/28/2015   Cystoid macular edema of left eye 09/23/2014   History of carotid endarterectomy 06/27/2014   Acquired myogenic ptosis of both eyelids 06/07/2014   Myogenic ptosis, acquired 06/07/2014   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   Migraine without aura 04/19/2013   Seizures (HCC) 04/19/2013   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   Keratoconus, 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   Minor corneal opacity 08/02/2011   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   Cough 01/14/2011   Pulmonary fibrosis (HCC) 01/14/2011    PCP: Gerome Brunet, DO REFERRING PROVIDER: Joshua Alm Hamilton, MD  REFERRING DIAG:  Diagnosis  H81.13 (ICD-10-CM) - Benign paroxysmal vertigo, bilateral    THERAPY DIAG:  Unsteadiness on feet  BPPV (benign paroxysmal positional vertigo),  bilateral  ONSET DATE: mid. November 2025  Rationale for Evaluation and Treatment: Rehabilitation  SUBJECTIVE:   SUBJECTIVE STATEMENT: Pt reports she no longer has any of the spinning vertigo when she lies down or turns over in bed; pt reports that now she has had a lot of imbalance when she does a quick turn - says she was at a friend's house and was getting ready to walk down some steps and her friend said something to her - says she turned around quickly to and almost fell in some shrubbery due to loss of balance.  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 Pain in bil. Shoulders; has to sleep on her back  PRECAUTIONS: Fall, dizziness  RED FLAGS: None   WEIGHT BEARING RESTRICTIONS: No  FALLS: Has patient fallen in last 6 months? No  LIVING ENVIRONMENT: Lives with: lives with their spouse Lives in:  House/apartment  PLOF: Independent  PATIENT GOALS: resolve the vertigo  OBJECTIVE:  Note: Objective measures were completed at Evaluation unless otherwise noted.  DIAGNOSTIC FINDINGS:  IMPRESSION: 1. 5 mm facet mediated anterolisthesis of C2 on C3 with associated disc bulge and facet arthrosis, resulting in moderate spinal stenosis, with moderate right C3 foraminal narrowing. Mild cord flattening with subtle hazy cord signal changes at this level, suspicious for myelomalacia. Appearance is similar to prior. 2. Additional multilevel cervical spondylosis with resultant mild spinal stenosis at C3-4 through C5-6. 3. Multifactorial degenerative changes with resultant multilevel foraminal narrowing as above. Notable findings include severe right with moderate left C4 and C5 foraminal stenosis, severe right with moderate left  C6 foraminal narrowing, with moderate bilateral C8 foraminal stenosis.  COGNITION: Overall cognitive status: Within functional limits for tasks assessed    GAIT: Gait pattern: [facility] Distance walked: 81' Assistive device utilized: None Level of assistance: Modified independence Comments: HHA needed after Epley maneuver due to unsteadiness and c/o dizziness as BPPV not resolved  VESTIBULAR ASSESSMENT:  GENERAL OBSERVATION: Pt amb. Without device to evaluation, accompanied by her husband.  Pt reports dizziness started in Oct. 2025.  Says she gets dizzy when she gets up and down off floor   SYMPTOM BEHAVIOR:  Subjective history: started in Oct. 2025; rolling provokes dizziness as well as getting up/down off floor  Non-Vestibular symptoms: neck pain, nausea/vomiting, and migraine symptoms  Type of dizziness: Spinning/Vertigo  Frequency: varies - usually daily  Duration: minutes  Aggravating factors: Induced by position change: rolling to the right, rolling to the left, supine to sit, and bending down and getting up  Relieving factors: head stationary  Progression of symptoms: unchanged  POSITIONAL TESTING: Right Dix-Hallpike: upbeating, right nystagmus Right Sidelying: upbeating, right nystagmus Left Sidelying: upbeating, left nystagmus  MOTION SENSITIVITY:  Motion Sensitivity Quotient Intensity: 0 = none, 1 = Lightheaded, 2 = Mild, 3 = Moderate, 4 = Severe, 5 = Vomiting  Intensity  1. Sitting to supine   2. Supine to L side   3. Supine to R side   4. Supine to sitting   5. L Hallpike-Dix   6. Up from L    7. R Hallpike-Dix   8. Up from R    9. Sitting, head tipped to L knee   10. Head up from L knee   11. Sitting, head tipped to R knee   12. Head up from R knee   13. Sitting head turns x5   14.Sitting head nods x5   15. In stance, 180 turn to L    16. In stance, 180 turn to R                                                                                                                                TREATMENT DATE: 08-06-24  NeuroRe-ed: X1 viewing in seated position - target approx. 4' away on plain background 30 secs x 1  rep  In standing - x1 viewing horizontal head turns 30 secs x 1 rep - pt initially braced legs against mat table for assist with balance but was able to move away from mat table  In seated position - pt performed horizontal head turns 5 reps; no c/o dizziness; performed in standing position - min to mod unsteadiness with head turns  TherAct: Lt sidelying test (-) with no nystagmus and no c/o vertigo Rt sidelying test (-) with no nystagmus and no c/o vertigo           mCTSIB:   Condition 1 - feet together - 18 secs, 2 secs, 12 secs; feet 3 apart - pt stood for 30 secs Condition 2 - feet 3 apart - 30 secs Condition 3 - feet 4 apart (on Airex);  30 secs Condition 4 - 11.44 secs, 30 secs  Pt performed SLS on each leg - at counter for safety:  RLE - approx. 1-2 secs                                                                                       LLE - unable stand SLS without UE support on counter  Habituation; pt performed sit to stand, step and pivot turn to Lt side and then to Rt side - min assist for balance recovery due to LOB with the quick turn - c/o nausea provoked with th quick turn Pt performed 2nd rep - step and pivot turn - slower than 1st rep but still had unsteadiness with the turn back to starting position- min. C/o nausea provoked with this speed/turn Performed 3rd rep - with much slower speed - pt had no LOB/unsteadiness and reported no nausea provoked with this speed   HEP issued: 08-06-24 Access Code: [email or web address] URL: [email or web address] Date: 08/06/2024 Prepared by: Rock Kussmaul  Exercises - Standing on pillow - EYES OPEN  - 1 x daily - 7 x weekly - 1 sets - 1-2 reps - 30 sec hold - Standing Balance with Eyes Closed on Foam  - 1 x daily - 7 x weekly - 1 sets - 1 reps - 30 secs hold - Single  Leg Stance with Support  - 1 x daily - 7 x weekly - 1 sets - 2-3 reps - 10 sec hold   PATIENT EDUCATION: Education details: HEP - Medbridge and x1 viewing;  recommended pt hold on gaze stabilization exercise until new contacts received for improved acuity with this exercise Person educated: Patient and Spouse Education method: Explanation, Demonstration, and Handouts Education comprehension: verbalized understanding and returned demonstration  HOME EXERCISE PROGRAM: 07-02-24:  Brandt-Daroff exercise for Lt BPPV    06-29-24: Rolling    With pillow under head, start on back. Roll slowly to right. Hold position until symptoms subside. Roll slowly onto left side. Hold position until symptoms subside. Repeat sequence __5__ times per session. Do __3-5__ sessions per day.    GOALS: Goals reviewed with patient? Yes  SHORT TERM GOALS: SAME as LTG's   LONG TERM GOALS: Target date: 07-27-24;  Extend Target date  - 08-18-24 to match auth. Date   Pt will have (-) Rt and Lt  Dix-Hallpike test and also (-) Rt horizontal roll test to indicate resolution of multi-canal BPPV. Baseline: (+) Rt Dix-Hallpike test, (+) Lt sidelying test and (+) Rt horizontal roll test Goal status: Goal met 08-06-24  2.  Pt will perform all bed mobility with no c/o vertigo. Baseline:  Goal status: Goal met 08-06-24  3.  Pt will report no dizziness with bending over/returning to upright and also no dizziness with transfers. Baseline:  Goal status: In progress  4.  Independent in Epley maneuver and/or Brandt-Daroff exercises for self treatment prn. Baseline:  Goal status: Goal met 08-06-24  5.  Independent in HEP for habituation and balance exercises. Baseline:  Goal status: In progress 08-06-24  ASSESSMENT:  CLINICAL IMPRESSION: Pt's Lt BPPV has resolved as pt has (-) Rt and Lt sidelying tests with no nystagmus and no c/o vertigo in either test position.  Pt reports no spinning vertigo with any bed mobility.   Pt does demonstrate vestibular hypofunction with decreased vestibular input in maintaining balance as she has significant unsteadiness/LOB with quick turns and is unable to stand with feet together with EC on compliant surface.  Pt stood with feet 4 apart on Airex for conditions 3 and 4 of mCTSIB and able to maintain position for 30 with feet 4 apart.  Pt has met LTG's #1,2, & 4:  LTG's #3 and 5 are in progress.  HEP for vestibular/balance training was issued in today's session.  Cont with POC.    OBJECTIVE IMPAIRMENTS: decreased balance, difficulty walking, and dizziness.   ACTIVITY LIMITATIONS: bending, transfers, bed mobility, and locomotion level  PARTICIPATION LIMITATIONS: meal prep, cleaning, laundry, driving, community activity, and volunteer work in pediatrics at hospital  PERSONAL FACTORS: 1-2 comorbidities: h/o migraines, CVA, RA are also affecting patient's functional outcome.   REHAB POTENTIAL: Excellent  CLINICAL DECISION MAKING: Evolving/moderate complexity  EVALUATION COMPLEXITY: Moderate   PLAN:  PT FREQUENCY: 1x/week  PT DURATION: 4 weeks + eval  PLANNED INTERVENTIONS: 97110-Therapeutic exercises, 97530- Therapeutic activity, [id number]- Neuromuscular re-education, [phone]- Self Care, [id number]- Gait training, [phone]- Canalith repositioning, and Patient/Family education  PLAN FOR NEXT SESSION: check HEP for ?'s:  balance/vestibular activities if Lt BPPV remains resolved   Lannette Avellino, [place], PT 08/06/2024, 12:12 PM  "

## 2024-08-07 ENCOUNTER — Ambulatory Visit: Admission: RE | Admit: 2024-08-07 | Discharge: 2024-08-07 | Disposition: A | Source: Ambulatory Visit

## 2024-08-07 DIAGNOSIS — M4312 Spondylolisthesis, cervical region: Secondary | ICD-10-CM

## 2024-08-07 DIAGNOSIS — M069 Rheumatoid arthritis, unspecified: Secondary | ICD-10-CM

## 2024-08-13 ENCOUNTER — Ambulatory Visit: Admitting: Physical Therapy

## 2024-08-14 ENCOUNTER — Ambulatory Visit: Admitting: Rheumatology

## 2024-09-04 ENCOUNTER — Ambulatory Visit: Admitting: Cardiology

## 2024-09-05 ENCOUNTER — Ambulatory Visit: Admitting: Adult Health

## 2024-09-18 ENCOUNTER — Ambulatory Visit: Admitting: Adult Health

## 2024-10-03 ENCOUNTER — Institutional Professional Consult (permissible substitution): Admitting: Neurology

## 2024-10-31 ENCOUNTER — Ambulatory Visit: Admitting: Rheumatology

## 2024-12-17 ENCOUNTER — Ambulatory Visit: Admitting: Internal Medicine

## 2024-12-20 ENCOUNTER — Ambulatory Visit: Admitting: Neurology
